# Patient Record
Sex: Female | Born: 1945
Health system: Southern US, Community
[De-identification: ages and names within clinical notes are randomized; demographics above are authoritative.]

## PROBLEM LIST (undated history)

## (undated) DIAGNOSIS — K219 Gastro-esophageal reflux disease without esophagitis: Secondary | ICD-10-CM

## (undated) DIAGNOSIS — Z9221 Personal history of antineoplastic chemotherapy: Secondary | ICD-10-CM

## (undated) DIAGNOSIS — G459 Transient cerebral ischemic attack, unspecified: Secondary | ICD-10-CM

## (undated) DIAGNOSIS — M1990 Unspecified osteoarthritis, unspecified site: Secondary | ICD-10-CM

## (undated) DIAGNOSIS — E039 Hypothyroidism, unspecified: Secondary | ICD-10-CM

## (undated) DIAGNOSIS — C7951 Secondary malignant neoplasm of bone: Secondary | ICD-10-CM

## (undated) DIAGNOSIS — I10 Essential (primary) hypertension: Secondary | ICD-10-CM

## (undated) DIAGNOSIS — F419 Anxiety disorder, unspecified: Secondary | ICD-10-CM

## (undated) DIAGNOSIS — E7800 Pure hypercholesterolemia, unspecified: Secondary | ICD-10-CM

## (undated) DIAGNOSIS — Z923 Personal history of irradiation: Secondary | ICD-10-CM

## (undated) DIAGNOSIS — C801 Malignant (primary) neoplasm, unspecified: Secondary | ICD-10-CM

## (undated) HISTORY — DX: Hypothyroidism, unspecified: E03.9

## (undated) HISTORY — PX: TOTAL KNEE ARTHROPLASTY: SHX125

## (undated) HISTORY — PX: OTHER SURGICAL HISTORY: SHX169

## (undated) HISTORY — PX: TONSILLECTOMY: SUR1361

## (undated) HISTORY — PX: CATARACT EXTRACTION, BILATERAL: SHX1313

## (undated) HISTORY — DX: Secondary malignant neoplasm of bone: C79.51

## (undated) HISTORY — DX: Unspecified osteoarthritis, unspecified site: M19.90

## (undated) HISTORY — PX: TUBAL LIGATION: SHX77

## (undated) HISTORY — DX: Pure hypercholesterolemia, unspecified: E78.00

## (undated) HISTORY — DX: Gastro-esophageal reflux disease without esophagitis: K21.9

---

## 1995-03-25 HISTORY — PX: OTHER SURGICAL HISTORY: SHX169

## 1995-03-25 HISTORY — PX: MASTECTOMY MODIFIED RADICAL: SUR848

## 1997-11-14 ENCOUNTER — Other Ambulatory Visit: Admission: RE | Admit: 1997-11-14 | Discharge: 1997-11-14 | Payer: Self-pay | Admitting: Obstetrics and Gynecology

## 1997-11-16 ENCOUNTER — Ambulatory Visit (HOSPITAL_COMMUNITY): Admission: RE | Admit: 1997-11-16 | Discharge: 1997-11-16 | Payer: Self-pay | Admitting: Oncology

## 1998-05-29 ENCOUNTER — Encounter (HOSPITAL_COMMUNITY): Payer: Self-pay | Admitting: Oncology

## 1998-05-29 ENCOUNTER — Ambulatory Visit (HOSPITAL_COMMUNITY): Admission: RE | Admit: 1998-05-29 | Discharge: 1998-05-29 | Payer: Self-pay | Admitting: Oncology

## 1998-05-30 ENCOUNTER — Ambulatory Visit (HOSPITAL_COMMUNITY): Admission: RE | Admit: 1998-05-30 | Discharge: 1998-05-30 | Payer: Self-pay | Admitting: Oncology

## 1998-05-30 ENCOUNTER — Encounter (HOSPITAL_COMMUNITY): Payer: Self-pay | Admitting: Oncology

## 1998-12-04 ENCOUNTER — Other Ambulatory Visit: Admission: RE | Admit: 1998-12-04 | Discharge: 1998-12-04 | Payer: Self-pay | Admitting: Obstetrics and Gynecology

## 1999-02-11 ENCOUNTER — Ambulatory Visit (HOSPITAL_COMMUNITY): Admission: RE | Admit: 1999-02-11 | Discharge: 1999-02-11 | Payer: Self-pay | Admitting: Oncology

## 1999-02-11 ENCOUNTER — Encounter (HOSPITAL_COMMUNITY): Payer: Self-pay | Admitting: Oncology

## 1999-02-12 ENCOUNTER — Encounter (HOSPITAL_COMMUNITY): Payer: Self-pay | Admitting: Oncology

## 1999-02-12 ENCOUNTER — Ambulatory Visit (HOSPITAL_COMMUNITY): Admission: RE | Admit: 1999-02-12 | Discharge: 1999-02-12 | Payer: Self-pay | Admitting: Oncology

## 1999-04-17 ENCOUNTER — Encounter (HOSPITAL_COMMUNITY): Payer: Self-pay | Admitting: Oncology

## 1999-04-17 ENCOUNTER — Ambulatory Visit (HOSPITAL_COMMUNITY): Admission: RE | Admit: 1999-04-17 | Discharge: 1999-04-17 | Payer: Self-pay | Admitting: Oncology

## 1999-08-14 ENCOUNTER — Encounter: Payer: Self-pay | Admitting: Obstetrics and Gynecology

## 1999-08-14 ENCOUNTER — Encounter: Admission: RE | Admit: 1999-08-14 | Discharge: 1999-08-14 | Payer: Self-pay | Admitting: Obstetrics and Gynecology

## 1999-10-21 ENCOUNTER — Encounter (HOSPITAL_COMMUNITY): Payer: Self-pay | Admitting: Oncology

## 1999-10-21 ENCOUNTER — Encounter: Admission: RE | Admit: 1999-10-21 | Discharge: 1999-10-21 | Payer: Self-pay | Admitting: Oncology

## 1999-11-20 ENCOUNTER — Other Ambulatory Visit: Admission: RE | Admit: 1999-11-20 | Discharge: 1999-11-20 | Payer: Self-pay | Admitting: Obstetrics and Gynecology

## 2000-02-17 ENCOUNTER — Ambulatory Visit (HOSPITAL_COMMUNITY): Admission: RE | Admit: 2000-02-17 | Discharge: 2000-02-17 | Payer: Self-pay | Admitting: Oncology

## 2000-02-17 ENCOUNTER — Encounter (HOSPITAL_COMMUNITY): Payer: Self-pay | Admitting: Oncology

## 2000-02-18 ENCOUNTER — Ambulatory Visit (HOSPITAL_COMMUNITY): Admission: RE | Admit: 2000-02-18 | Discharge: 2000-02-18 | Payer: Self-pay | Admitting: Oncology

## 2000-02-18 ENCOUNTER — Encounter (HOSPITAL_COMMUNITY): Payer: Self-pay | Admitting: Oncology

## 2000-08-19 ENCOUNTER — Encounter: Payer: Self-pay | Admitting: Obstetrics and Gynecology

## 2000-08-19 ENCOUNTER — Encounter: Admission: RE | Admit: 2000-08-19 | Discharge: 2000-08-19 | Payer: Self-pay | Admitting: Obstetrics and Gynecology

## 2000-10-15 ENCOUNTER — Encounter (HOSPITAL_COMMUNITY): Payer: Self-pay | Admitting: Oncology

## 2000-10-15 ENCOUNTER — Ambulatory Visit (HOSPITAL_COMMUNITY): Admission: RE | Admit: 2000-10-15 | Discharge: 2000-10-15 | Payer: Self-pay | Admitting: Oncology

## 2000-11-24 ENCOUNTER — Other Ambulatory Visit: Admission: RE | Admit: 2000-11-24 | Discharge: 2000-11-24 | Payer: Self-pay | Admitting: Internal Medicine

## 2001-02-22 ENCOUNTER — Ambulatory Visit (HOSPITAL_COMMUNITY): Admission: RE | Admit: 2001-02-22 | Discharge: 2001-02-22 | Payer: Self-pay | Admitting: Oncology

## 2001-02-22 ENCOUNTER — Encounter (HOSPITAL_COMMUNITY): Payer: Self-pay | Admitting: Oncology

## 2001-08-23 ENCOUNTER — Encounter: Admission: RE | Admit: 2001-08-23 | Discharge: 2001-08-23 | Payer: Self-pay | Admitting: Oncology

## 2001-08-23 ENCOUNTER — Encounter (HOSPITAL_COMMUNITY): Payer: Self-pay | Admitting: Oncology

## 2001-10-01 ENCOUNTER — Ambulatory Visit (HOSPITAL_COMMUNITY): Admission: RE | Admit: 2001-10-01 | Discharge: 2001-10-01 | Payer: Self-pay | Admitting: Gastroenterology

## 2001-10-01 ENCOUNTER — Encounter (INDEPENDENT_AMBULATORY_CARE_PROVIDER_SITE_OTHER): Payer: Self-pay | Admitting: *Deleted

## 2002-05-16 ENCOUNTER — Ambulatory Visit (HOSPITAL_COMMUNITY): Admission: RE | Admit: 2002-05-16 | Discharge: 2002-05-16 | Payer: Self-pay | Admitting: Oncology

## 2002-05-16 ENCOUNTER — Encounter (HOSPITAL_COMMUNITY): Payer: Self-pay | Admitting: Oncology

## 2002-05-23 ENCOUNTER — Encounter (HOSPITAL_COMMUNITY): Payer: Self-pay | Admitting: Oncology

## 2002-05-23 ENCOUNTER — Ambulatory Visit (HOSPITAL_COMMUNITY): Admission: RE | Admit: 2002-05-23 | Discharge: 2002-05-23 | Payer: Self-pay | Admitting: Oncology

## 2002-09-05 ENCOUNTER — Encounter: Admission: RE | Admit: 2002-09-05 | Discharge: 2002-09-05 | Payer: Self-pay | Admitting: Oncology

## 2002-09-05 ENCOUNTER — Encounter (HOSPITAL_COMMUNITY): Payer: Self-pay | Admitting: Oncology

## 2002-11-22 ENCOUNTER — Encounter (HOSPITAL_COMMUNITY): Payer: Self-pay | Admitting: Oncology

## 2002-11-22 ENCOUNTER — Ambulatory Visit (HOSPITAL_COMMUNITY): Admission: RE | Admit: 2002-11-22 | Discharge: 2002-11-22 | Payer: Self-pay | Admitting: Oncology

## 2003-06-22 ENCOUNTER — Ambulatory Visit (HOSPITAL_COMMUNITY): Admission: RE | Admit: 2003-06-22 | Discharge: 2003-06-22 | Payer: Self-pay | Admitting: Oncology

## 2003-09-11 ENCOUNTER — Encounter: Admission: RE | Admit: 2003-09-11 | Discharge: 2003-09-11 | Payer: Self-pay | Admitting: Oncology

## 2004-06-21 ENCOUNTER — Ambulatory Visit: Payer: Self-pay | Admitting: Oncology

## 2004-06-24 ENCOUNTER — Ambulatory Visit (HOSPITAL_COMMUNITY): Admission: RE | Admit: 2004-06-24 | Discharge: 2004-06-24 | Payer: Self-pay | Admitting: Oncology

## 2004-09-20 ENCOUNTER — Encounter: Admission: RE | Admit: 2004-09-20 | Discharge: 2004-09-20 | Payer: Self-pay | Admitting: Oncology

## 2004-12-23 ENCOUNTER — Ambulatory Visit: Payer: Self-pay | Admitting: Oncology

## 2005-07-14 ENCOUNTER — Ambulatory Visit: Payer: Self-pay | Admitting: Oncology

## 2005-07-14 LAB — CBC WITH DIFFERENTIAL/PLATELET
BASO%: 0.4 % (ref 0.0–2.0)
Basophils Absolute: 0 10*3/uL (ref 0.0–0.1)
EOS%: 1.6 % (ref 0.0–7.0)
Eosinophils Absolute: 0.1 10*3/uL (ref 0.0–0.5)
HCT: 40.7 % (ref 34.8–46.6)
HGB: 13.6 g/dL (ref 11.6–15.9)
LYMPH%: 27.4 % (ref 14.0–48.0)
MCH: 27.8 pg (ref 26.0–34.0)
MCHC: 33.6 g/dL (ref 32.0–36.0)
MCV: 82.7 fL (ref 81.0–101.0)
MONO#: 0.6 10*3/uL (ref 0.1–0.9)
MONO%: 9.5 % (ref 0.0–13.0)
NEUT#: 3.7 10*3/uL (ref 1.5–6.5)
NEUT%: 61.1 % (ref 39.6–76.8)
Platelets: 252 10*3/uL (ref 145–400)
RBC: 4.92 10*6/uL (ref 3.70–5.32)
RDW: 14.5 % (ref 11.3–14.5)
WBC: 6.1 10*3/uL (ref 3.9–10.0)
lymph#: 1.7 10*3/uL (ref 0.9–3.3)

## 2005-07-14 LAB — LIPID PANEL
Cholesterol: 196 mg/dL (ref 0–200)
HDL: 61 mg/dL (ref >39)
LDL Cholesterol: 95 mg/dL (ref 0–99)
Total CHOL/HDL Ratio: 3.2 Ratio
Triglycerides: 200 mg/dL — ABNORMAL HIGH (ref <150)
VLDL: 40 mg/dL (ref 0–40)

## 2005-07-14 LAB — COMPREHENSIVE METABOLIC PANEL
ALT: 11 U/L (ref 0–40)
AST: 17 U/L (ref 0–37)
Albumin: 4.6 g/dL (ref 3.5–5.2)
Alkaline Phosphatase: 69 U/L (ref 39–117)
BUN: 16 mg/dL (ref 6–23)
CO2: 27 mEq/L (ref 19–32)
Calcium: 9.4 mg/dL (ref 8.4–10.5)
Chloride: 103 mEq/L (ref 96–112)
Creatinine, Ser: 0.9 mg/dL (ref 0.4–1.2)
Glucose, Bld: 91 mg/dL (ref 70–99)
Potassium: 4.4 mEq/L (ref 3.5–5.3)
Sodium: 140 mEq/L (ref 135–145)
Total Bilirubin: 0.5 mg/dL (ref 0.3–1.2)
Total Protein: 7 g/dL (ref 6.0–8.3)

## 2005-07-14 LAB — LACTATE DEHYDROGENASE: LDH: 138 U/L (ref 94–250)

## 2005-09-29 ENCOUNTER — Encounter: Admission: RE | Admit: 2005-09-29 | Discharge: 2005-09-29 | Payer: Self-pay | Admitting: Obstetrics and Gynecology

## 2005-12-26 ENCOUNTER — Ambulatory Visit: Payer: Self-pay | Admitting: Oncology

## 2006-01-13 ENCOUNTER — Inpatient Hospital Stay (HOSPITAL_COMMUNITY): Admission: RE | Admit: 2006-01-13 | Discharge: 2006-01-16 | Payer: Self-pay | Admitting: Specialist

## 2006-06-25 ENCOUNTER — Ambulatory Visit: Payer: Self-pay | Admitting: Oncology

## 2006-06-30 ENCOUNTER — Ambulatory Visit (HOSPITAL_COMMUNITY): Admission: RE | Admit: 2006-06-30 | Discharge: 2006-06-30 | Payer: Self-pay | Admitting: Oncology

## 2006-06-30 LAB — CBC WITH DIFFERENTIAL/PLATELET
BASO%: 0.7 % (ref 0.0–2.0)
Basophils Absolute: 0 10*3/uL (ref 0.0–0.1)
EOS%: 1.8 % (ref 0.0–7.0)
Eosinophils Absolute: 0.1 10*3/uL (ref 0.0–0.5)
HCT: 38.5 % (ref 34.8–46.6)
HGB: 13.3 g/dL (ref 11.6–15.9)
LYMPH%: 28.9 % (ref 14.0–48.0)
MCH: 27.5 pg (ref 26.0–34.0)
MCHC: 34.6 g/dL (ref 32.0–36.0)
MCV: 79.6 fL — ABNORMAL LOW (ref 81.0–101.0)
MONO#: 0.4 10*3/uL (ref 0.1–0.9)
MONO%: 9.5 % (ref 0.0–13.0)
NEUT#: 2.6 10*3/uL (ref 1.5–6.5)
NEUT%: 59.1 % (ref 39.6–76.8)
Platelets: 253 10*3/uL (ref 145–400)
RBC: 4.84 10*6/uL (ref 3.70–5.32)
RDW: 15.6 % — ABNORMAL HIGH (ref 11.3–14.5)
WBC: 4.3 10*3/uL (ref 3.9–10.0)
lymph#: 1.3 10*3/uL (ref 0.9–3.3)

## 2006-06-30 LAB — COMPREHENSIVE METABOLIC PANEL
ALT: 10 U/L (ref 0–35)
AST: 16 U/L (ref 0–37)
Albumin: 4.9 g/dL (ref 3.5–5.2)
Alkaline Phosphatase: 75 U/L (ref 39–117)
BUN: 13 mg/dL (ref 6–23)
CO2: 27 mEq/L (ref 19–32)
Calcium: 9.6 mg/dL (ref 8.4–10.5)
Chloride: 102 mEq/L (ref 96–112)
Creatinine, Ser: 0.87 mg/dL (ref 0.40–1.20)
Glucose, Bld: 99 mg/dL (ref 70–99)
Potassium: 4.4 mEq/L (ref 3.5–5.3)
Sodium: 141 mEq/L (ref 135–145)
Total Bilirubin: 0.6 mg/dL (ref 0.3–1.2)
Total Protein: 7.1 g/dL (ref 6.0–8.3)

## 2006-06-30 LAB — LIPID PANEL
Cholesterol: 197 mg/dL (ref 0–200)
HDL: 52 mg/dL (ref >39)
LDL Cholesterol: 93 mg/dL (ref 0–99)
Total CHOL/HDL Ratio: 3.8 Ratio
Triglycerides: 262 mg/dL — ABNORMAL HIGH (ref <150)
VLDL: 52 mg/dL — ABNORMAL HIGH (ref 0–40)

## 2006-06-30 LAB — LACTATE DEHYDROGENASE: LDH: 125 U/L (ref 94–250)

## 2006-10-01 ENCOUNTER — Encounter: Admission: RE | Admit: 2006-10-01 | Discharge: 2006-10-01 | Payer: Self-pay | Admitting: Family Medicine

## 2006-12-30 ENCOUNTER — Ambulatory Visit: Payer: Self-pay | Admitting: Oncology

## 2007-01-01 LAB — COMPREHENSIVE METABOLIC PANEL
ALT: 10 U/L (ref 0–35)
AST: 16 U/L (ref 0–37)
Albumin: 4.6 g/dL (ref 3.5–5.2)
Alkaline Phosphatase: 70 U/L (ref 39–117)
BUN: 13 mg/dL (ref 6–23)
CO2: 26 mEq/L (ref 19–32)
Calcium: 9.1 mg/dL (ref 8.4–10.5)
Chloride: 101 mEq/L (ref 96–112)
Creatinine, Ser: 0.83 mg/dL (ref 0.40–1.20)
Glucose, Bld: 91 mg/dL (ref 70–99)
Potassium: 4 mEq/L (ref 3.5–5.3)
Sodium: 139 mEq/L (ref 135–145)
Total Bilirubin: 0.4 mg/dL (ref 0.3–1.2)
Total Protein: 6.9 g/dL (ref 6.0–8.3)

## 2007-01-01 LAB — CBC WITH DIFFERENTIAL/PLATELET
BASO%: 1.1 % (ref 0.0–2.0)
Basophils Absolute: 0 10*3/uL (ref 0.0–0.1)
EOS%: 2.8 % (ref 0.0–7.0)
Eosinophils Absolute: 0.1 10*3/uL (ref 0.0–0.5)
HCT: 38.2 % (ref 34.8–46.6)
HGB: 13.2 g/dL (ref 11.6–15.9)
LYMPH%: 30.7 % (ref 14.0–48.0)
MCH: 27.8 pg (ref 26.0–34.0)
MCHC: 34.4 g/dL (ref 32.0–36.0)
MCV: 80.9 fL — ABNORMAL LOW (ref 81.0–101.0)
MONO#: 0.4 10*3/uL (ref 0.1–0.9)
MONO%: 9.7 % (ref 0.0–13.0)
NEUT#: 2.5 10*3/uL (ref 1.5–6.5)
NEUT%: 55.7 % (ref 39.6–76.8)
Platelets: 254 10*3/uL (ref 145–400)
RBC: 4.73 10*6/uL (ref 3.70–5.32)
RDW: 14.7 % — ABNORMAL HIGH (ref 11.3–14.5)
WBC: 4.6 10*3/uL (ref 3.9–10.0)
lymph#: 1.4 10*3/uL (ref 0.9–3.3)

## 2007-01-01 LAB — LACTATE DEHYDROGENASE: LDH: 140 U/L (ref 94–250)

## 2007-01-18 ENCOUNTER — Encounter: Admission: RE | Admit: 2007-01-18 | Discharge: 2007-01-18 | Payer: Self-pay | Admitting: Oncology

## 2007-06-29 ENCOUNTER — Ambulatory Visit: Payer: Self-pay | Admitting: Oncology

## 2007-07-01 LAB — COMPREHENSIVE METABOLIC PANEL
ALT: 12 U/L (ref 0–35)
AST: 17 U/L (ref 0–37)
Albumin: 4.7 g/dL (ref 3.5–5.2)
Alkaline Phosphatase: 63 U/L (ref 39–117)
BUN: 17 mg/dL (ref 6–23)
CO2: 26 mEq/L (ref 19–32)
Calcium: 9.2 mg/dL (ref 8.4–10.5)
Chloride: 104 mEq/L (ref 96–112)
Creatinine, Ser: 0.87 mg/dL (ref 0.40–1.20)
Glucose, Bld: 73 mg/dL (ref 70–99)
Potassium: 3.7 mEq/L (ref 3.5–5.3)
Sodium: 142 mEq/L (ref 135–145)
Total Bilirubin: 0.5 mg/dL (ref 0.3–1.2)
Total Protein: 6.8 g/dL (ref 6.0–8.3)

## 2007-07-01 LAB — CBC WITH DIFFERENTIAL/PLATELET
BASO%: 0.2 % (ref 0.0–2.0)
Basophils Absolute: 0 10*3/uL (ref 0.0–0.1)
EOS%: 1.8 % (ref 0.0–7.0)
Eosinophils Absolute: 0.1 10*3/uL (ref 0.0–0.5)
HCT: 39.8 % (ref 34.8–46.6)
HGB: 13.8 g/dL (ref 11.6–15.9)
LYMPH%: 29 % (ref 14.0–48.0)
MCH: 28.2 pg (ref 26.0–34.0)
MCHC: 34.6 g/dL (ref 32.0–36.0)
MCV: 81.5 fL (ref 81.0–101.0)
MONO#: 0.4 10*3/uL (ref 0.1–0.9)
MONO%: 8.6 % (ref 0.0–13.0)
NEUT#: 2.8 10*3/uL (ref 1.5–6.5)
NEUT%: 60.4 % (ref 39.6–76.8)
Platelets: 231 10*3/uL (ref 145–400)
RBC: 4.88 10*6/uL (ref 3.70–5.32)
RDW: 14.5 % (ref 11.3–14.5)
WBC: 4.7 10*3/uL (ref 3.9–10.0)
lymph#: 1.4 10*3/uL (ref 0.9–3.3)

## 2007-07-01 LAB — LACTATE DEHYDROGENASE: LDH: 132 U/L (ref 94–250)

## 2007-07-29 ENCOUNTER — Encounter: Admission: RE | Admit: 2007-07-29 | Discharge: 2007-07-29 | Payer: Self-pay | Admitting: Oncology

## 2007-10-20 ENCOUNTER — Encounter: Admission: RE | Admit: 2007-10-20 | Discharge: 2007-10-20 | Payer: Self-pay | Admitting: Oncology

## 2007-12-29 ENCOUNTER — Ambulatory Visit: Payer: Self-pay | Admitting: Oncology

## 2007-12-31 ENCOUNTER — Ambulatory Visit (HOSPITAL_COMMUNITY): Admission: RE | Admit: 2007-12-31 | Discharge: 2007-12-31 | Payer: Self-pay | Admitting: Oncology

## 2007-12-31 LAB — COMPREHENSIVE METABOLIC PANEL
ALT: 12 U/L (ref 0–35)
AST: 16 U/L (ref 0–37)
Albumin: 4.7 g/dL (ref 3.5–5.2)
Alkaline Phosphatase: 62 U/L (ref 39–117)
BUN: 14 mg/dL (ref 6–23)
CO2: 23 mEq/L (ref 19–32)
Calcium: 9.1 mg/dL (ref 8.4–10.5)
Chloride: 106 mEq/L (ref 96–112)
Creatinine, Ser: 0.86 mg/dL (ref 0.40–1.20)
Glucose, Bld: 93 mg/dL (ref 70–99)
Potassium: 4.2 mEq/L (ref 3.5–5.3)
Sodium: 141 mEq/L (ref 135–145)
Total Bilirubin: 0.6 mg/dL (ref 0.3–1.2)
Total Protein: 7 g/dL (ref 6.0–8.3)

## 2007-12-31 LAB — CBC WITH DIFFERENTIAL/PLATELET
BASO%: 0.8 % (ref 0.0–2.0)
Basophils Absolute: 0 10*3/uL (ref 0.0–0.1)
EOS%: 2.6 % (ref 0.0–7.0)
Eosinophils Absolute: 0.1 10*3/uL (ref 0.0–0.5)
HCT: 40.3 % (ref 34.8–46.6)
HGB: 13.7 g/dL (ref 11.6–15.9)
LYMPH%: 30.4 % (ref 14.0–48.0)
MCH: 28.2 pg (ref 26.0–34.0)
MCHC: 34.1 g/dL (ref 32.0–36.0)
MCV: 82.6 fL (ref 81.0–101.0)
MONO#: 0.5 10*3/uL (ref 0.1–0.9)
MONO%: 9.9 % (ref 0.0–13.0)
NEUT#: 2.7 10*3/uL (ref 1.5–6.5)
NEUT%: 56.3 % (ref 39.6–76.8)
Platelets: 215 10*3/uL (ref 145–400)
RBC: 4.88 10*6/uL (ref 3.70–5.32)
RDW: 15.1 % — ABNORMAL HIGH (ref 11.3–14.5)
WBC: 4.7 10*3/uL (ref 3.9–10.0)
lymph#: 1.4 10*3/uL (ref 0.9–3.3)

## 2007-12-31 LAB — LACTATE DEHYDROGENASE: LDH: 142 U/L (ref 94–250)

## 2008-06-28 ENCOUNTER — Ambulatory Visit: Payer: Self-pay | Admitting: Oncology

## 2008-06-30 LAB — CBC WITH DIFFERENTIAL/PLATELET
BASO%: 0.8 % (ref 0.0–2.0)
Basophils Absolute: 0 10*3/uL (ref 0.0–0.1)
EOS%: 2 % (ref 0.0–7.0)
Eosinophils Absolute: 0.1 10*3/uL (ref 0.0–0.5)
HCT: 39.5 % (ref 34.8–46.6)
HGB: 13.4 g/dL (ref 11.6–15.9)
LYMPH%: 30.1 % (ref 14.0–49.7)
MCH: 28.2 pg (ref 25.1–34.0)
MCHC: 33.8 g/dL (ref 31.5–36.0)
MCV: 83.3 fL (ref 79.5–101.0)
MONO#: 0.4 10*3/uL (ref 0.1–0.9)
MONO%: 9.3 % (ref 0.0–14.0)
NEUT#: 2.8 10*3/uL (ref 1.5–6.5)
NEUT%: 57.8 % (ref 38.4–76.8)
Platelets: 228 10*3/uL (ref 145–400)
RBC: 4.74 10*6/uL (ref 3.70–5.45)
RDW: 14.3 % (ref 11.2–14.5)
WBC: 4.8 10*3/uL (ref 3.9–10.3)
lymph#: 1.4 10*3/uL (ref 0.9–3.3)

## 2008-06-30 LAB — COMPREHENSIVE METABOLIC PANEL
ALT: 14 U/L (ref 0–35)
AST: 19 U/L (ref 0–37)
Albumin: 4.6 g/dL (ref 3.5–5.2)
Alkaline Phosphatase: 66 U/L (ref 39–117)
BUN: 16 mg/dL (ref 6–23)
CO2: 27 mEq/L (ref 19–32)
Calcium: 9.6 mg/dL (ref 8.4–10.5)
Chloride: 104 mEq/L (ref 96–112)
Creatinine, Ser: 0.88 mg/dL (ref 0.40–1.20)
Glucose, Bld: 74 mg/dL (ref 70–99)
Potassium: 4 mEq/L (ref 3.5–5.3)
Sodium: 143 mEq/L (ref 135–145)
Total Bilirubin: 0.5 mg/dL (ref 0.3–1.2)
Total Protein: 6.7 g/dL (ref 6.0–8.3)

## 2008-06-30 LAB — LACTATE DEHYDROGENASE: LDH: 152 U/L (ref 94–250)

## 2008-10-20 ENCOUNTER — Encounter: Admission: RE | Admit: 2008-10-20 | Discharge: 2008-10-20 | Payer: Self-pay | Admitting: Family Medicine

## 2008-10-24 ENCOUNTER — Encounter: Admission: RE | Admit: 2008-10-24 | Discharge: 2008-10-24 | Payer: Self-pay | Admitting: Family Medicine

## 2008-12-27 ENCOUNTER — Ambulatory Visit: Payer: Self-pay | Admitting: Oncology

## 2008-12-29 ENCOUNTER — Ambulatory Visit (HOSPITAL_COMMUNITY): Admission: RE | Admit: 2008-12-29 | Discharge: 2008-12-29 | Payer: Self-pay | Admitting: Oncology

## 2008-12-29 LAB — CBC WITH DIFFERENTIAL/PLATELET
BASO%: 1.1 % (ref 0.0–2.0)
Basophils Absolute: 0 10*3/uL (ref 0.0–0.1)
EOS%: 2.9 % (ref 0.0–7.0)
Eosinophils Absolute: 0.1 10*3/uL (ref 0.0–0.5)
HCT: 40 % (ref 34.8–46.6)
HGB: 13.6 g/dL (ref 11.6–15.9)
LYMPH%: 26.6 % (ref 14.0–49.7)
MCH: 28.5 pg (ref 25.1–34.0)
MCHC: 34.1 g/dL (ref 31.5–36.0)
MCV: 83.4 fL (ref 79.5–101.0)
MONO#: 0.5 10*3/uL (ref 0.1–0.9)
MONO%: 11.3 % (ref 0.0–14.0)
NEUT#: 2.4 10*3/uL (ref 1.5–6.5)
NEUT%: 58.1 % (ref 38.4–76.8)
Platelets: 213 10*3/uL (ref 145–400)
RBC: 4.79 10*6/uL (ref 3.70–5.45)
RDW: 14.6 % — ABNORMAL HIGH (ref 11.2–14.5)
WBC: 4.2 10*3/uL (ref 3.9–10.3)
lymph#: 1.1 10*3/uL (ref 0.9–3.3)

## 2008-12-29 LAB — COMPREHENSIVE METABOLIC PANEL
ALT: 12 U/L (ref 0–35)
AST: 21 U/L (ref 0–37)
Albumin: 4.7 g/dL (ref 3.5–5.2)
Alkaline Phosphatase: 63 U/L (ref 39–117)
BUN: 21 mg/dL (ref 6–23)
CO2: 28 mEq/L (ref 19–32)
Calcium: 9.5 mg/dL (ref 8.4–10.5)
Chloride: 102 mEq/L (ref 96–112)
Creatinine, Ser: 1.01 mg/dL (ref 0.40–1.20)
Glucose, Bld: 65 mg/dL — ABNORMAL LOW (ref 70–99)
Potassium: 4.7 mEq/L (ref 3.5–5.3)
Sodium: 141 mEq/L (ref 135–145)
Total Bilirubin: 0.6 mg/dL (ref 0.3–1.2)
Total Protein: 6.8 g/dL (ref 6.0–8.3)

## 2008-12-29 LAB — LACTATE DEHYDROGENASE: LDH: 155 U/L (ref 94–250)

## 2009-10-26 ENCOUNTER — Encounter: Admission: RE | Admit: 2009-10-26 | Discharge: 2009-10-26 | Payer: Self-pay | Admitting: Family Medicine

## 2009-10-31 ENCOUNTER — Encounter: Admission: RE | Admit: 2009-10-31 | Discharge: 2009-10-31 | Payer: Self-pay | Admitting: Family Medicine

## 2009-11-05 ENCOUNTER — Encounter: Admission: RE | Admit: 2009-11-05 | Discharge: 2009-11-05 | Payer: Self-pay | Admitting: Family Medicine

## 2009-12-26 ENCOUNTER — Ambulatory Visit: Payer: Self-pay | Admitting: Oncology

## 2009-12-28 LAB — LACTATE DEHYDROGENASE: LDH: 155 U/L (ref 94–250)

## 2009-12-28 LAB — CBC WITH DIFFERENTIAL/PLATELET
BASO%: 0.7 % (ref 0.0–2.0)
Basophils Absolute: 0 10*3/uL (ref 0.0–0.1)
EOS%: 2.1 % (ref 0.0–7.0)
Eosinophils Absolute: 0.1 10*3/uL (ref 0.0–0.5)
HCT: 41.2 % (ref 34.8–46.6)
HGB: 14.1 g/dL (ref 11.6–15.9)
LYMPH%: 24.2 % (ref 14.0–49.7)
MCH: 29.2 pg (ref 25.1–34.0)
MCHC: 34.3 g/dL (ref 31.5–36.0)
MCV: 85.1 fL (ref 79.5–101.0)
MONO#: 0.4 10*3/uL (ref 0.1–0.9)
MONO%: 9.2 % (ref 0.0–14.0)
NEUT#: 2.5 10*3/uL (ref 1.5–6.5)
NEUT%: 63.8 % (ref 38.4–76.8)
Platelets: 213 10*3/uL (ref 145–400)
RBC: 4.84 10*6/uL (ref 3.70–5.45)
RDW: 14.4 % (ref 11.2–14.5)
WBC: 3.9 10*3/uL (ref 3.9–10.3)
lymph#: 0.9 10*3/uL (ref 0.9–3.3)

## 2009-12-28 LAB — COMPREHENSIVE METABOLIC PANEL
ALT: 13 U/L (ref 0–35)
AST: 22 U/L (ref 0–37)
Albumin: 4.6 g/dL (ref 3.5–5.2)
Alkaline Phosphatase: 59 U/L (ref 39–117)
BUN: 19 mg/dL (ref 6–23)
CO2: 28 mEq/L (ref 19–32)
Calcium: 9.1 mg/dL (ref 8.4–10.5)
Chloride: 100 mEq/L (ref 96–112)
Creatinine, Ser: 0.99 mg/dL (ref 0.40–1.20)
Glucose, Bld: 83 mg/dL (ref 70–99)
Potassium: 4 mEq/L (ref 3.5–5.3)
Sodium: 139 mEq/L (ref 135–145)
Total Bilirubin: 0.6 mg/dL (ref 0.3–1.2)
Total Protein: 6.6 g/dL (ref 6.0–8.3)

## 2010-04-14 ENCOUNTER — Encounter: Payer: Self-pay | Admitting: Family Medicine

## 2010-08-09 NOTE — Consult Note (Signed)
NAME:  Jade Mooney, Jade Mooney              ACCOUNT NO.:  [id number]   MEDICAL RECORD NO.:  [id number]          PATIENT TYPE:  INP   LOCATION:  1503                         FACILITY:  [facility]   PHYSICIAN:  Jackie Plum, M.D.DATE OF BIRTH:  08/10/1945   DATE OF CONSULTATION:  01/15/2006  DATE OF DISCHARGE:                                   CONSULTATION   REQUESTING PHYSICIAN:  Dr. Thomasena Edis of Orthopedics.   REASON FOR CONSULTATION:  Hyponatremia.   HPI:  Patient is a 65 year old lady with history of end-stage osteoarthritis  of the left knee who had failed conservative management for her pain and was  therefore admitted and had left knee total arthroplasty done on January 13, 2006, by Dr. Thomasena Edis.  Patient had been hyponatremic, which had been  worsening, and hospitalist service was asked to evaluate for further  treatment in this regard.  Patient has been nauseous and actually vomited  twice this morning.  No chest pain, no fever or chills.  No abdominal pain.  No diarrhea but she has not moved her bowels since last afternoon.  She has  history of hypothyroidism, dyslipidemia and osteoarthritis.  Current  medicines were reviewed, as noted on the Admission Medication List, as well  as the home medications also reviewed on Medication Form.   SHE HAS ALLERGIES AND INTOLERANCE TO:  1. PERCODAN.  2. PERCOCET .  3. AZITHROMYCIN.  4. AMOXICILLIN.   FAMILY HISTORY:  Positive for:  1. Heart disease.  2. Diabetes.  3. Hypertension.  4. Stroke.   SOCIAL HISTORY:  Patient is married.  Does not smoke cigarettes and drinks  alcohol on a social basis.   REVIEW OF SYSTEMS:  As noted above, otherwise unremarkable.   PHYSICAL EXAMINATION:  The BP 96/64, pulse 68, respirations 20, temp 98.6  degrees Fahrenheit.  An O2 sat of 88% on room air.  GENERAL EXAM:  The patient was not acutely ill looking.  She was not in any  acute cardiopulmonary distress.  HEENT:  Normocephalic, atraumatic.  Pupils  were equal, round, react to  light.  Extraocular movements intact.  She had mild sclera pallor without  icterus.  NECK:  Supple, no JVD.  LUNGS:  With decreased breath sounds at the bases.  CARDIAC:  Regular rate and rhythm, no gallops or murmur.  ABDOMEN:  Slightly distended bowel sounds were present.  There was no  obvious tenderness.  EXTREMITIES:  No cyanosis.  Left lower extremity was in a cast.  There was  no edema involving the right lower extremity.  Patient was alert and oriented x3, no acute focal deficit.   LAB WORK:  A WBC count 6.2, hemoglobin 9.3, hematocrit 26.9, MCV 91.8,  platelet count 177,000, INR 1.5, PT 18.4, sodium 126 (sodium was 133 on  January 14, 2006, at 4 a.m. but had come up to 130 at 1:50 p.m. later during  the day yesterday).  Potassium 3.5, chloride 95, CO2 25, glucose 144, BUN 9,  creatinine 0.6.   IMPRESSION:  1. Hyponatremia.  2. Nausea and vomiting probably secondary to diagnosis #1 but cannot rule  out ileus.  3. Postoperative anemia.   PLAN:  1. The patient will be started on IV saline.  2. Would check a KUB.  3. Will also check an x-ray of the chest since her O2 saturation on room      air is marginal.  4. Will get TSH.  5. Will schedule her Reglan to 10 mg every 8 hours.  6. Will continue other antinauseas and antiemetics.  7. Will follow up on her sodium.      Jackie Plum, M.D.  Electronically Signed     GO/MEDQ  D:  01/15/2006  T:  01/15/2006  Job:  [id number]

## 2010-08-09 NOTE — H&P (Signed)
NAME:  Jade, Mooney NO.:  [id number]   MEDICAL RECORD NO.:  [id number]           PATIENT TYPE:   LOCATION:                                 FACILITY:   PHYSICIAN:  Erasmo Leventhal, M.D.DATE OF BIRTH:  01-02-1946   DATE OF ADMISSION:  DATE OF DISCHARGE:                                HISTORY & PHYSICAL   DATE OF SURGERY:  January 13, 2006   CHIEF COMPLAINT:  Left knee osteoarthritis.   HISTORY OF PRESENT ILLNESS:  This is a 65 year old lady with a history of  end-stage osteoarthritis of the left knee who has failed conservative  management of her pain.  She has daily pain, pain with ambulation and after  discussion of treatment options, the patient is now scheduled for total knee  arthroplasty of the left knee.  The surgery, benefits and aftercare were  discussed in detail with the patient.  Questions invited and answered and  surgery go ahead is scheduled.   PAST MEDICAL HISTORY:   DRUG ALLERGIES:  TO PERCODAN AND PERCOCET WITH NAUSEA AND VOMITING AND JAW  TIGHTNESS AND ERYTHROMYCIN AND AMOXICILLIN WITH A RASH.   CURRENT MEDICATIONS:  1. Include Synthroid 100 mcg daily.  2. Zyrtec 10 mg daily.  3. Aspirin 81 mg daily.  4. Acular 0.5% b.i.d. to the face for rosacea.  5. Lorazepam 0.5 mg one daily.  6. Metro lotion 0.75% twice a day to the face for rosacea.  7. Nexium 40 mg one p.o. daily.  8. Zocor 20 mg one p.o. daily.  9. Celebrex 200 mg one daily.   PREVIOUS SURGERIES:  1. Include tonsillectomy.  2. Tubal ligation.  3. Mastectomy for breast cancer.  4. Knee surgery.   SERIOUS MEDICAL ILLNESSES:  1. Include hypothyroidism.  2. Hypercholesterolemia.  3. Gastroesophageal reflux disease rosacea.  4. Breast cancer.   FAMILY HISTORY:  Positive for coronary disease, hypertension, diabetes and  CVA.   SOCIAL HISTORY:  The patient is married.  She is retired.  She lives at  home.  She does not smoke and drinks rarely.   REVIEW OF SYSTEMS:   Nervous system negative for headache, blurred vision or  dizziness.  PULMONARY:  No shortness of breath, PND nor orthopnea.  CARDIOVASCULAR:  No chest pain or palpitation.  GI:  Positive for GERD, cholecystitis and diverticulitis.  GU:  Negative for urinary tract difficulty.  MUSCULOSKELETAL:  Positive in HPI.   PHYSICAL EXAM:  BP 140/90, respiration 18, pulse 78 and regular.  GENERAL APPEARANCE:  This is a well-developed, well-nourished lady in no  acute distress.  HEENT:  Head normocephalic.  Nose patent.  Ears patent.  Pupils , round,  reactive to light.  Throat without injection.  NECK:  Supple without adenopathy.  Carotid 2+ without bruit.  CHEST:  Clear to auscultation.  No rales or rhonchi.  Respirations 18.  HEART:  Regular rate and rhythm at 78 beats per without murmur.  ABDOMEN:  Soft with active bowel sounds.  No mass or organomegaly.  NEUROLOGIC:  Patient alert and oriented to time, place and person.  Cranial  nerves II-XII grossly intact.  EXTREMITIES:  Shows the left knee with valgus deformity.  Negative 5 to 135  degrees range of motion.  Dorsalis pedis and posterior tibialis pulses are  2+.   X-RAYS:  Show end-stage osteoarthritis of the left knee with valgus  deformity.   IMPRESSION:  End-stage osteoarthritis of the left knee with valgus  deformity.   PLAN:  Total knee arthroplasty, left knee.      Jaquelyn Bitter. Chabon, P.A.    ______________________________  Erasmo Leventhal, M.D.    SJC/MEDQ  D:  12/24/2005  T:  12/25/2005  Job:  [id number]

## 2010-08-09 NOTE — Discharge Summary (Signed)
NAME:  Jade Mooney, Jade Mooney              ACCOUNT NO.:  [id number]   MEDICAL RECORD NO.:  [id number]          PATIENT TYPE:  INP   LOCATION:  1503                         FACILITY:  [facility]   PHYSICIAN:  Erasmo Leventhal, M.D.DATE OF BIRTH:  [birth year]   DATE OF ADMISSION:  01/13/2006  DATE OF DISCHARGE:  01/16/2006                                 DISCHARGE SUMMARY   __________   Her hemoglobin and hematocrit reached a low of __________  She had an  episode of mildly elevated potassium __________  She did, however, have an  episode of hyponatremia down to 1.6 and medical consult was obtained.  Fluids were __________ and on discharge her sodium and potassium was normal.  INR at discharge 2.1.  TSH level at 2.63.  Urinalysis normal.   HOSPITAL COURSE:  The patient tolerated the operative procedure well.  The  first postoperative day, her vital signs were stable, she was afebrile.  Hemoglobin was 10.4 and hematocrit 30.2.  Potassium was 5.3.  Lungs were  clear.  Heart sounds normal.  Bowel sounds active.  Moving extremities  without difficulty.  Calves were negative.  Her IV fluids were switched to  normal saline.  TPA was discontinued and switched to p.o. pain medication.  On the second postoperative day, she was a little nauseated and her vital  signs were stable.  She was afebrile.  O2 saturations just dropped to 88 on  room air.  O2 was restarted and she was back up to 96.  Hemoglobin 9.3 and  hematocrit 26.9.  Sodium was 126 despite fluid restrictions.  His potassium  had returned to normal.  Her dressing is clean and wound benign.  __________  negative.  Bowel sounds active.  Lung sounds clear.  A medical consult was  obtained for hyponatremia.  She was switched to normal saline at 75 an hour.  __________ water restriction was continued.  Abdominal x-ray showed no ileus  or obstruction.  Chest x-ray showed some mild atelectasis.  On the third  postoperative day, her vital signs were  stable with temperature at 100.2.  I's and O's were good.  Hemoglobin __________  White count normal.  BMET now  within normal limits with exception of glucose at 118 and calcium at 10.9  which is on the increase and INR of 2.1.  TSH was normal at 2.63.  Chest x-  ray showed left lower lobe atelectasis.  Abdomen showed no distention except  for an ileus.  Urinalysis was normal.  Lungs were clear.  Heart sounds  normal.  Bowel sounds active.  Calves are negative.  __________ benign.  Subsequently, the patient requested to go home and she will be discharged  home today after seen by the medical service and physical therapy.   CONDITION ON DISCHARGE:  Improved.   DISCHARGE MEDICATIONS:  1. Norco 5/325 one to two p.o. q.4 h. p.r.n. pain.  2. Robaxin 500 mg p.o. daily p.r.n. spasm.  3. __________ for anemia.  4. Coumadin per pharmacy.   FOLLOW UP:  Follow up in the office in 2 weeks.   SPECIAL INSTRUCTIONS:  She is to do her home PT and do her home exercises.  Use incentive spirometry q.1 h. while she is awake.  __________  and call if  any problems arise.      Jaquelyn Bitter. Chabon, P.A.    ______________________________  Erasmo Leventhal, M.D.    SJC/MEDQ  D:  01/16/2006  T:  01/17/2006  Job:  [id number]

## 2010-08-09 NOTE — Procedures (Signed)
[facility]  Patient:    Jade Mooney, Jade Mooney Visit Number: [id number] MRN: [id number]          Service Type: END Location: ENDO Attending Physician:  Nelda Marseille Dictated by:   Petra Kuba, M.D. Proc. Date: 10/01/01 Admit Date:  10/01/2001 Discharge Date: 10/01/2001   CC:         Samul Dada, M.D.   Procedure Report  PROCEDURE:  Colonoscopy.  INDICATIONS FOR PROCEDURE:  A patient with bright red blood per rectum, right sided abdominal pain due for colonic screening.  Consent was signed after risks, benefits, methods, and options were thoroughly discussed in the office.  MEDICINES USED:  Demerol 70, Versed 6.  DESCRIPTION OF PROCEDURE:  Rectal inspection was pertinent for small external hemorrhoids. Digital exam was negative. The pediatric video adjustable colonoscope was inserted and easily advanced around the colon to the cecum. This did require some abdominal pressure but no position changes. On insertion, some left sided diverticula were seen but no other abnormalities. The cecum was identified by the appendiceal orifice and the ileocecal valve. In fact, the scope was inserted a short ways into the terminal ileum which was normal. Photo documentation was obtained. The scope was then slowly withdrawn. The prep was adequate, there was some liquid stool that required washing and suctioning. On slow withdrawal through the colon, the right side of the colon was normal. In the more proximal descending, a tiny polyp was seen and was hot biopsied x1. There was some left sided diverticula and in the distal sigmoid another tiny polyp was seen and was hot biopsied x1 and put in the same container. The scope was withdrawn back to the rectum and retroflexed pertinent for some small internal hemorrhoids. The scope was straightened, advanced a short ways up the left side of the colon, air was suctioned, scope removed. The patient tolerated  the procedure well and there was no obvious or immediate complications.  ENDOSCOPIC DIAGNOSIS: 1. Small internal and external hemorrhoids. 2. Left sided diverticula. 3. Two tiny distal sigmoid and proximal descending polyps hot biopsied. 4. Otherwise within normal limits to the terminal ileum.  PLAN:  Yearly rectals and guaiacs per Dr. Arline Asp or GYN or primary care. Happy to see back p.r.n.  Consider a small bowel follow-through next if her pain continues, otherwise, await pathology to determine future colonic screening. Dictated by:   Petra Kuba, M.D. Attending Physician:  Nelda Marseille DD:  10/01/01 TD:  10/04/01 Job: [id number]

## 2010-08-09 NOTE — Op Note (Signed)
NAME:  Jade Mooney, Jade Mooney NO.:  [id number]   MEDICAL RECORD NO.:  [id number]          PATIENT TYPE:  INP   LOCATION:  0012                         FACILITY:  [facility]   PHYSICIAN:  Jade Mooney, M.D.DATE OF BIRTH:  1946-03-12   DATE OF PROCEDURE:  01/13/2006  DATE OF DISCHARGE:                                 OPERATIVE REPORT   PREOPERATIVE DIAGNOSIS:  Left knee end stage osteoarthritis.   POSTOPERATIVE DIAGNOSIS:  Left knee end stage osteoarthritis.   PROCEDURE:  Left total knee arthroplasty.   SURGEON:  Jade Mooney, M.D.   ASSISTANT:  Jade Mooney, P.A.-C.   ANESTHESIA:  Spinal Duramorph.   ESTIMATED BLOOD LOSS:  Less than 100 mL.   DRAINS:  Two medium Hemovacs.   COMPLICATIONS:  None.   TOURNIQUET TIME:  1 hour 25 minutes at 300 mmHg.   OPERATIVE IMPLANTS:  [employer] and [employer].  Sigma.  Size 3 femur, size  3 tibia, 10 mm posterior stabilized rotating platform tibial insert and a 32  mm all polyethylene patella, all cemented.   PROCEDURE IN DETAIL:  The patient was counseled in the holding area, the  correct side was identified.  IV was started, antibiotics were given.  The  patient was then taken to the operating room and placed in the supine  position.  He was then turned lateral where the spinal anesthetic was  administered.  Following this, a Foley catheter was placed utilizing sterile  technique by the OR circulating nurse.  The operative extremity was well  padded.  The left lower extremity was elevated.  She had full extension,  flexion to 125.  It was prepped with DuraPrep and draped in a sterile  fashion.  It was exsanguinated with an Esmarch and the tourniquet inflated  to 300 mmHg.   A straight midline incision was made through the skin and subcutaneous  tissue.  Medial and lateral soft tissue flaps were developed.  A medial  parapatellar arthrotomy was performed and the proximal medial tibial was  exposed.  We  were very careful not to do much of a release due to the fact  she has just a slight valgus knee.  The patella was retracted but no  everted, the knee was flexed.  We found that she had end stage arthritis  changes with bone against bone on the lateral side, lateral medial side, and  advanced on the patellofemoral with end stage changes.  At this point in  time, the starting hole was made in the distal femur, the canal was  irrigated until the effluent was clear, extramedullary guide was gently  placed.  We chose to take a 5 degree valgus cut, initially took 11 mm off  the distal femur.  The distal femur was found to be a size 3.  Rotational  marks were made and the distal femur was cut to fit a size 3.  Medial and  lateral menisci were removed.  The geniculate vessels were coagulated.  The  neurovascular structures were protected throughout the entire case.  The  tibia was resected.  The proximal  tibia was found to be a size 3.  A  starting hole was made, the step reamer was utilized, the canal was  irrigated until the effluent was clear.  An intramedullary guide was gently  placed.  We chose a 0 degree slope with a 2 mm cut based upon the medial  side.  Posteromedial and posterolateral osteophytes were removed.   At this point in time, with flexion extension blocks, we were satisfactory  in flexion but extension was tight.  Therefore, we took another 2 mm off the  distal femur.  Now with the flexion extension blocks with 2 mm, we had  excellent flexion and extension gaps, perfectly balanced.  The tibial  baseplate was applied.  The coverage and rotation was set.  A reamer punch  was then utilized.  The femoral box cut was now prepared.  At this time, a  size 3 femur, size 3 tibia, with a 10 insert, we had excellent range of  motion and soft tissue balance, stable to varus and valgus stress.  The  patella was found to be a size 32.  The appropriate amount of bone was  resected.  The  patella was fit to a size 32 and locking holes were made.  At  this time, the knee was irrigated with pulsatile lavage.  Utilizing the  Moder and Katrinka Blazing technique, all components were cemented into place, size 3  tibia, size 3 femur, with a 10 trial insert, and a 32 patella.  After the  cement had cured, excess cement was removed, the knee was irrigated.  We had  a well balanced knee with a trial insert of 10.  The tibial trial was  removed, the tibia was subluxed anteriorly, and the final 10 mm posterior  stabilized rotating platform tibial insert was implanted.  The knee was then  placed through a gentle range of motion and was stable to varus and valgus  stress, excellent gaps, patellofemoral track was anatomic.   The knee was again irrigated.  Two medium Hemovac drains were placed.  Sequential closure of the layers was closed, the arthrotomy Vicryl, subcu  with Vicryl, the skin was closed with subcuticular Monocryl sutures.  Steri-  Strips were applied.  The drains were hooked to suction.  A sterile  compressive dressing was applied.  The tourniquet was deflated.  There was  normal circulation at the foot and ankle at the end of the case.  She was  awakened and taken to the PACU in stable condition.  Sponge and needle  counts were correct.  There were no complications or problems.  To help with  surgery and decision making, Mr. Jade Mooney was needed.           ______________________________  Jade Mooney, M.D.     RAC/MEDQ  D:  01/13/2006  T:  01/14/2006  Job:  [id number]

## 2010-11-05 ENCOUNTER — Other Ambulatory Visit (HOSPITAL_COMMUNITY): Payer: Self-pay | Admitting: Oncology

## 2010-11-05 ENCOUNTER — Other Ambulatory Visit: Payer: Self-pay | Admitting: Family Medicine

## 2010-11-05 DIAGNOSIS — Z1231 Encounter for screening mammogram for malignant neoplasm of breast: Secondary | ICD-10-CM

## 2010-11-15 ENCOUNTER — Ambulatory Visit
Admission: RE | Admit: 2010-11-15 | Discharge: 2010-11-15 | Disposition: A | Discharge status: Home / Self Care | Payer: BLUE CROSS/BLUE SHIELD | Source: Ambulatory Visit | Attending: Oncology | Admitting: Oncology

## 2010-11-15 DIAGNOSIS — Z1231 Encounter for screening mammogram for malignant neoplasm of breast: Secondary | ICD-10-CM

## 2011-01-17 ENCOUNTER — Other Ambulatory Visit (HOSPITAL_COMMUNITY): Payer: Self-pay | Admitting: Oncology

## 2011-01-17 ENCOUNTER — Encounter (HOSPITAL_BASED_OUTPATIENT_CLINIC_OR_DEPARTMENT_OTHER): Payer: Medicare Other | Admitting: Oncology

## 2011-01-17 DIAGNOSIS — Z853 Personal history of malignant neoplasm of breast: Secondary | ICD-10-CM

## 2011-01-17 DIAGNOSIS — C787 Secondary malignant neoplasm of liver and intrahepatic bile duct: Secondary | ICD-10-CM

## 2011-01-17 DIAGNOSIS — M25559 Pain in unspecified hip: Secondary | ICD-10-CM

## 2011-01-17 LAB — COMPREHENSIVE METABOLIC PANEL
ALT: 17 U/L (ref 0–35)
AST: 21 U/L (ref 0–37)
Albumin: 4.7 g/dL (ref 3.5–5.2)
Alkaline Phosphatase: 75 U/L (ref 39–117)
BUN: 19 mg/dL (ref 6–23)
CO2: 31 mEq/L (ref 19–32)
Calcium: 9.7 mg/dL (ref 8.4–10.5)
Chloride: 101 mEq/L (ref 96–112)
Creatinine, Ser: 0.87 mg/dL (ref 0.50–1.10)
Glucose, Bld: 68 mg/dL — ABNORMAL LOW (ref 70–99)
Potassium: 4.3 mEq/L (ref 3.5–5.3)
Sodium: 141 mEq/L (ref 135–145)
Total Bilirubin: 0.3 mg/dL (ref 0.3–1.2)
Total Protein: 6.9 g/dL (ref 6.0–8.3)

## 2011-01-17 LAB — CBC WITH DIFFERENTIAL/PLATELET
BASO%: 0.5 % (ref 0.0–2.0)
Basophils Absolute: 0 10*3/uL (ref 0.0–0.1)
EOS%: 2.1 % (ref 0.0–7.0)
Eosinophils Absolute: 0.1 10*3/uL (ref 0.0–0.5)
HCT: 42.5 % (ref 34.8–46.6)
HGB: 14.3 g/dL (ref 11.6–15.9)
LYMPH%: 30 % (ref 14.0–49.7)
MCH: 29.2 pg (ref 25.1–34.0)
MCHC: 33.6 g/dL (ref 31.5–36.0)
MCV: 86.8 fL (ref 79.5–101.0)
MONO#: 0.5 10*3/uL (ref 0.1–0.9)
MONO%: 9.6 % (ref 0.0–14.0)
NEUT#: 3 10*3/uL (ref 1.5–6.5)
NEUT%: 57.8 % (ref 38.4–76.8)
Platelets: 252 10*3/uL (ref 145–400)
RBC: 4.89 10*6/uL (ref 3.70–5.45)
RDW: 13.3 % (ref 11.2–14.5)
WBC: 5.1 10*3/uL (ref 3.9–10.3)
lymph#: 1.5 10*3/uL (ref 0.9–3.3)

## 2011-01-17 LAB — LACTATE DEHYDROGENASE: LDH: 138 U/L (ref 94–250)

## 2011-04-07 DIAGNOSIS — H35359 Cystoid macular degeneration, unspecified eye: Secondary | ICD-10-CM | POA: Diagnosis not present

## 2011-04-07 DIAGNOSIS — H353190 Nonexudative age-related macular degeneration, unspecified eye, stage unspecified: Secondary | ICD-10-CM | POA: Diagnosis not present

## 2011-05-02 DIAGNOSIS — N952 Postmenopausal atrophic vaginitis: Secondary | ICD-10-CM | POA: Diagnosis not present

## 2011-05-02 DIAGNOSIS — R3989 Other symptoms and signs involving the genitourinary system: Secondary | ICD-10-CM | POA: Diagnosis not present

## 2011-05-05 DIAGNOSIS — R3989 Other symptoms and signs involving the genitourinary system: Secondary | ICD-10-CM | POA: Diagnosis not present

## 2011-07-11 DIAGNOSIS — E782 Mixed hyperlipidemia: Secondary | ICD-10-CM | POA: Diagnosis not present

## 2011-07-11 DIAGNOSIS — Z79899 Other long term (current) drug therapy: Secondary | ICD-10-CM | POA: Diagnosis not present

## 2011-07-11 DIAGNOSIS — E039 Hypothyroidism, unspecified: Secondary | ICD-10-CM | POA: Diagnosis not present

## 2011-10-06 DIAGNOSIS — H524 Presbyopia: Secondary | ICD-10-CM | POA: Diagnosis not present

## 2011-10-06 DIAGNOSIS — H353190 Nonexudative age-related macular degeneration, unspecified eye, stage unspecified: Secondary | ICD-10-CM | POA: Diagnosis not present

## 2011-10-27 ENCOUNTER — Other Ambulatory Visit: Payer: Self-pay | Admitting: Family Medicine

## 2011-10-27 DIAGNOSIS — Z1231 Encounter for screening mammogram for malignant neoplasm of breast: Secondary | ICD-10-CM

## 2011-12-16 ENCOUNTER — Ambulatory Visit
Admission: RE | Admit: 2011-12-16 | Discharge: 2011-12-16 | Disposition: A | Discharge status: Home / Self Care | Payer: Medicare Other | Source: Ambulatory Visit | Attending: Family Medicine | Admitting: Family Medicine

## 2011-12-16 DIAGNOSIS — Z1231 Encounter for screening mammogram for malignant neoplasm of breast: Secondary | ICD-10-CM

## 2011-12-31 DIAGNOSIS — Z23 Encounter for immunization: Secondary | ICD-10-CM | POA: Diagnosis not present

## 2011-12-31 DIAGNOSIS — E7800 Pure hypercholesterolemia, unspecified: Secondary | ICD-10-CM | POA: Diagnosis not present

## 2011-12-31 DIAGNOSIS — Z79899 Other long term (current) drug therapy: Secondary | ICD-10-CM | POA: Diagnosis not present

## 2012-01-09 ENCOUNTER — Other Ambulatory Visit: Payer: Self-pay | Admitting: Gastroenterology

## 2012-01-09 DIAGNOSIS — Z86010 Personal history of colonic polyps: Secondary | ICD-10-CM | POA: Diagnosis not present

## 2012-01-09 DIAGNOSIS — Z09 Encounter for follow-up examination after completed treatment for conditions other than malignant neoplasm: Secondary | ICD-10-CM | POA: Diagnosis not present

## 2012-01-09 DIAGNOSIS — D126 Benign neoplasm of colon, unspecified: Secondary | ICD-10-CM | POA: Diagnosis not present

## 2012-01-09 DIAGNOSIS — K5730 Diverticulosis of large intestine without perforation or abscess without bleeding: Secondary | ICD-10-CM | POA: Diagnosis not present

## 2012-02-11 DIAGNOSIS — E7800 Pure hypercholesterolemia, unspecified: Secondary | ICD-10-CM | POA: Diagnosis not present

## 2012-02-11 DIAGNOSIS — E039 Hypothyroidism, unspecified: Secondary | ICD-10-CM | POA: Diagnosis not present

## 2012-02-11 DIAGNOSIS — E559 Vitamin D deficiency, unspecified: Secondary | ICD-10-CM | POA: Diagnosis not present

## 2012-04-08 DIAGNOSIS — H353190 Nonexudative age-related macular degeneration, unspecified eye, stage unspecified: Secondary | ICD-10-CM | POA: Diagnosis not present

## 2012-04-22 DIAGNOSIS — M5126 Other intervertebral disc displacement, lumbar region: Secondary | ICD-10-CM | POA: Diagnosis not present

## 2012-04-22 DIAGNOSIS — M999 Biomechanical lesion, unspecified: Secondary | ICD-10-CM | POA: Diagnosis not present

## 2012-04-22 DIAGNOSIS — IMO0002 Reserved for concepts with insufficient information to code with codable children: Secondary | ICD-10-CM | POA: Diagnosis not present

## 2012-04-23 DIAGNOSIS — M999 Biomechanical lesion, unspecified: Secondary | ICD-10-CM | POA: Diagnosis not present

## 2012-04-23 DIAGNOSIS — M5126 Other intervertebral disc displacement, lumbar region: Secondary | ICD-10-CM | POA: Diagnosis not present

## 2012-04-23 DIAGNOSIS — IMO0002 Reserved for concepts with insufficient information to code with codable children: Secondary | ICD-10-CM | POA: Diagnosis not present

## 2012-04-26 DIAGNOSIS — IMO0002 Reserved for concepts with insufficient information to code with codable children: Secondary | ICD-10-CM | POA: Diagnosis not present

## 2012-04-26 DIAGNOSIS — M999 Biomechanical lesion, unspecified: Secondary | ICD-10-CM | POA: Diagnosis not present

## 2012-04-26 DIAGNOSIS — M5126 Other intervertebral disc displacement, lumbar region: Secondary | ICD-10-CM | POA: Diagnosis not present

## 2012-04-29 DIAGNOSIS — M999 Biomechanical lesion, unspecified: Secondary | ICD-10-CM | POA: Diagnosis not present

## 2012-04-29 DIAGNOSIS — M5126 Other intervertebral disc displacement, lumbar region: Secondary | ICD-10-CM | POA: Diagnosis not present

## 2012-04-29 DIAGNOSIS — IMO0002 Reserved for concepts with insufficient information to code with codable children: Secondary | ICD-10-CM | POA: Diagnosis not present

## 2012-05-03 DIAGNOSIS — IMO0002 Reserved for concepts with insufficient information to code with codable children: Secondary | ICD-10-CM | POA: Diagnosis not present

## 2012-05-03 DIAGNOSIS — M5126 Other intervertebral disc displacement, lumbar region: Secondary | ICD-10-CM | POA: Diagnosis not present

## 2012-05-03 DIAGNOSIS — M999 Biomechanical lesion, unspecified: Secondary | ICD-10-CM | POA: Diagnosis not present

## 2012-05-10 DIAGNOSIS — IMO0002 Reserved for concepts with insufficient information to code with codable children: Secondary | ICD-10-CM | POA: Diagnosis not present

## 2012-05-10 DIAGNOSIS — M999 Biomechanical lesion, unspecified: Secondary | ICD-10-CM | POA: Diagnosis not present

## 2012-05-10 DIAGNOSIS — M5126 Other intervertebral disc displacement, lumbar region: Secondary | ICD-10-CM | POA: Diagnosis not present

## 2012-06-29 DIAGNOSIS — IMO0002 Reserved for concepts with insufficient information to code with codable children: Secondary | ICD-10-CM | POA: Diagnosis not present

## 2012-06-29 DIAGNOSIS — M999 Biomechanical lesion, unspecified: Secondary | ICD-10-CM | POA: Diagnosis not present

## 2012-06-29 DIAGNOSIS — M5126 Other intervertebral disc displacement, lumbar region: Secondary | ICD-10-CM | POA: Diagnosis not present

## 2012-07-02 DIAGNOSIS — IMO0002 Reserved for concepts with insufficient information to code with codable children: Secondary | ICD-10-CM | POA: Diagnosis not present

## 2012-07-02 DIAGNOSIS — M5126 Other intervertebral disc displacement, lumbar region: Secondary | ICD-10-CM | POA: Diagnosis not present

## 2012-07-02 DIAGNOSIS — M999 Biomechanical lesion, unspecified: Secondary | ICD-10-CM | POA: Diagnosis not present

## 2012-07-05 DIAGNOSIS — E559 Vitamin D deficiency, unspecified: Secondary | ICD-10-CM | POA: Diagnosis not present

## 2012-07-05 DIAGNOSIS — Z79899 Other long term (current) drug therapy: Secondary | ICD-10-CM | POA: Diagnosis not present

## 2012-07-05 DIAGNOSIS — E7800 Pure hypercholesterolemia, unspecified: Secondary | ICD-10-CM | POA: Diagnosis not present

## 2012-07-06 DIAGNOSIS — M5126 Other intervertebral disc displacement, lumbar region: Secondary | ICD-10-CM | POA: Diagnosis not present

## 2012-07-06 DIAGNOSIS — IMO0002 Reserved for concepts with insufficient information to code with codable children: Secondary | ICD-10-CM | POA: Diagnosis not present

## 2012-07-06 DIAGNOSIS — M999 Biomechanical lesion, unspecified: Secondary | ICD-10-CM | POA: Diagnosis not present

## 2012-07-08 DIAGNOSIS — D492 Neoplasm of unspecified behavior of bone, soft tissue, and skin: Secondary | ICD-10-CM | POA: Diagnosis not present

## 2012-07-08 DIAGNOSIS — K219 Gastro-esophageal reflux disease without esophagitis: Secondary | ICD-10-CM | POA: Diagnosis not present

## 2012-07-08 DIAGNOSIS — Z0000 Encounter for general adult medical examination without abnormal findings: Secondary | ICD-10-CM | POA: Diagnosis not present

## 2012-07-08 DIAGNOSIS — M25559 Pain in unspecified hip: Secondary | ICD-10-CM | POA: Diagnosis not present

## 2012-07-08 DIAGNOSIS — J309 Allergic rhinitis, unspecified: Secondary | ICD-10-CM | POA: Diagnosis not present

## 2012-07-13 DIAGNOSIS — M545 Low back pain, unspecified: Secondary | ICD-10-CM | POA: Diagnosis not present

## 2012-07-13 DIAGNOSIS — M25569 Pain in unspecified knee: Secondary | ICD-10-CM | POA: Diagnosis not present

## 2012-07-13 DIAGNOSIS — M6281 Muscle weakness (generalized): Secondary | ICD-10-CM | POA: Diagnosis not present

## 2012-07-16 DIAGNOSIS — M25569 Pain in unspecified knee: Secondary | ICD-10-CM | POA: Diagnosis not present

## 2012-07-16 DIAGNOSIS — M6281 Muscle weakness (generalized): Secondary | ICD-10-CM | POA: Diagnosis not present

## 2012-07-16 DIAGNOSIS — M545 Low back pain, unspecified: Secondary | ICD-10-CM | POA: Diagnosis not present

## 2012-07-21 DIAGNOSIS — M545 Low back pain, unspecified: Secondary | ICD-10-CM | POA: Diagnosis not present

## 2012-07-21 DIAGNOSIS — D492 Neoplasm of unspecified behavior of bone, soft tissue, and skin: Secondary | ICD-10-CM | POA: Diagnosis not present

## 2012-07-21 DIAGNOSIS — M6281 Muscle weakness (generalized): Secondary | ICD-10-CM | POA: Diagnosis not present

## 2012-07-21 DIAGNOSIS — M25569 Pain in unspecified knee: Secondary | ICD-10-CM | POA: Diagnosis not present

## 2012-07-28 DIAGNOSIS — M25569 Pain in unspecified knee: Secondary | ICD-10-CM | POA: Diagnosis not present

## 2012-07-28 DIAGNOSIS — M545 Low back pain, unspecified: Secondary | ICD-10-CM | POA: Diagnosis not present

## 2012-07-28 DIAGNOSIS — M6281 Muscle weakness (generalized): Secondary | ICD-10-CM | POA: Diagnosis not present

## 2012-08-18 DIAGNOSIS — M25569 Pain in unspecified knee: Secondary | ICD-10-CM | POA: Diagnosis not present

## 2012-08-18 DIAGNOSIS — M545 Low back pain, unspecified: Secondary | ICD-10-CM | POA: Diagnosis not present

## 2012-08-18 DIAGNOSIS — M6281 Muscle weakness (generalized): Secondary | ICD-10-CM | POA: Diagnosis not present

## 2012-08-20 DIAGNOSIS — M545 Low back pain, unspecified: Secondary | ICD-10-CM | POA: Diagnosis not present

## 2012-08-20 DIAGNOSIS — M25569 Pain in unspecified knee: Secondary | ICD-10-CM | POA: Diagnosis not present

## 2012-08-20 DIAGNOSIS — M6281 Muscle weakness (generalized): Secondary | ICD-10-CM | POA: Diagnosis not present

## 2012-08-23 DIAGNOSIS — M545 Low back pain, unspecified: Secondary | ICD-10-CM | POA: Diagnosis not present

## 2012-08-23 DIAGNOSIS — M6281 Muscle weakness (generalized): Secondary | ICD-10-CM | POA: Diagnosis not present

## 2012-08-23 DIAGNOSIS — M25569 Pain in unspecified knee: Secondary | ICD-10-CM | POA: Diagnosis not present

## 2012-08-27 DIAGNOSIS — M6281 Muscle weakness (generalized): Secondary | ICD-10-CM | POA: Diagnosis not present

## 2012-08-27 DIAGNOSIS — M25569 Pain in unspecified knee: Secondary | ICD-10-CM | POA: Diagnosis not present

## 2012-08-27 DIAGNOSIS — M545 Low back pain, unspecified: Secondary | ICD-10-CM | POA: Diagnosis not present

## 2012-08-31 DIAGNOSIS — M545 Low back pain, unspecified: Secondary | ICD-10-CM | POA: Diagnosis not present

## 2012-08-31 DIAGNOSIS — M25569 Pain in unspecified knee: Secondary | ICD-10-CM | POA: Diagnosis not present

## 2012-08-31 DIAGNOSIS — M6281 Muscle weakness (generalized): Secondary | ICD-10-CM | POA: Diagnosis not present

## 2012-09-07 DIAGNOSIS — M545 Low back pain, unspecified: Secondary | ICD-10-CM | POA: Diagnosis not present

## 2012-09-07 DIAGNOSIS — M25569 Pain in unspecified knee: Secondary | ICD-10-CM | POA: Diagnosis not present

## 2012-09-07 DIAGNOSIS — M6281 Muscle weakness (generalized): Secondary | ICD-10-CM | POA: Diagnosis not present

## 2012-09-14 DIAGNOSIS — M545 Low back pain, unspecified: Secondary | ICD-10-CM | POA: Diagnosis not present

## 2012-09-14 DIAGNOSIS — M25569 Pain in unspecified knee: Secondary | ICD-10-CM | POA: Diagnosis not present

## 2012-09-14 DIAGNOSIS — M6281 Muscle weakness (generalized): Secondary | ICD-10-CM | POA: Diagnosis not present

## 2012-09-23 DIAGNOSIS — M545 Low back pain, unspecified: Secondary | ICD-10-CM | POA: Diagnosis not present

## 2012-09-23 DIAGNOSIS — M6281 Muscle weakness (generalized): Secondary | ICD-10-CM | POA: Diagnosis not present

## 2012-09-23 DIAGNOSIS — M25569 Pain in unspecified knee: Secondary | ICD-10-CM | POA: Diagnosis not present

## 2012-09-28 DIAGNOSIS — M6281 Muscle weakness (generalized): Secondary | ICD-10-CM | POA: Diagnosis not present

## 2012-09-28 DIAGNOSIS — M545 Low back pain, unspecified: Secondary | ICD-10-CM | POA: Diagnosis not present

## 2012-09-28 DIAGNOSIS — M25569 Pain in unspecified knee: Secondary | ICD-10-CM | POA: Diagnosis not present

## 2012-10-07 DIAGNOSIS — M25569 Pain in unspecified knee: Secondary | ICD-10-CM | POA: Diagnosis not present

## 2012-10-07 DIAGNOSIS — M545 Low back pain, unspecified: Secondary | ICD-10-CM | POA: Diagnosis not present

## 2012-10-07 DIAGNOSIS — M6281 Muscle weakness (generalized): Secondary | ICD-10-CM | POA: Diagnosis not present

## 2012-10-13 DIAGNOSIS — M545 Low back pain, unspecified: Secondary | ICD-10-CM | POA: Diagnosis not present

## 2012-10-13 DIAGNOSIS — M25569 Pain in unspecified knee: Secondary | ICD-10-CM | POA: Diagnosis not present

## 2012-10-13 DIAGNOSIS — M549 Dorsalgia, unspecified: Secondary | ICD-10-CM | POA: Diagnosis not present

## 2012-10-13 DIAGNOSIS — R079 Chest pain, unspecified: Secondary | ICD-10-CM | POA: Diagnosis not present

## 2012-10-13 DIAGNOSIS — R071 Chest pain on breathing: Secondary | ICD-10-CM | POA: Diagnosis not present

## 2012-10-13 DIAGNOSIS — M6281 Muscle weakness (generalized): Secondary | ICD-10-CM | POA: Diagnosis not present

## 2012-10-14 DIAGNOSIS — M1710 Unilateral primary osteoarthritis, unspecified knee: Secondary | ICD-10-CM | POA: Diagnosis not present

## 2012-10-14 DIAGNOSIS — E782 Mixed hyperlipidemia: Secondary | ICD-10-CM | POA: Diagnosis not present

## 2012-10-14 DIAGNOSIS — IMO0002 Reserved for concepts with insufficient information to code with codable children: Secondary | ICD-10-CM | POA: Diagnosis not present

## 2012-10-14 DIAGNOSIS — Z006 Encounter for examination for normal comparison and control in clinical research program: Secondary | ICD-10-CM | POA: Diagnosis not present

## 2012-10-20 DIAGNOSIS — M6281 Muscle weakness (generalized): Secondary | ICD-10-CM | POA: Diagnosis not present

## 2012-10-20 DIAGNOSIS — M545 Low back pain, unspecified: Secondary | ICD-10-CM | POA: Diagnosis not present

## 2012-10-20 DIAGNOSIS — M25569 Pain in unspecified knee: Secondary | ICD-10-CM | POA: Diagnosis not present

## 2012-10-27 DIAGNOSIS — M25569 Pain in unspecified knee: Secondary | ICD-10-CM | POA: Diagnosis not present

## 2012-10-27 DIAGNOSIS — M6281 Muscle weakness (generalized): Secondary | ICD-10-CM | POA: Diagnosis not present

## 2012-10-27 DIAGNOSIS — M545 Low back pain, unspecified: Secondary | ICD-10-CM | POA: Diagnosis not present

## 2012-11-04 DIAGNOSIS — M25569 Pain in unspecified knee: Secondary | ICD-10-CM | POA: Diagnosis not present

## 2012-11-04 DIAGNOSIS — M545 Low back pain, unspecified: Secondary | ICD-10-CM | POA: Diagnosis not present

## 2012-11-04 DIAGNOSIS — M6281 Muscle weakness (generalized): Secondary | ICD-10-CM | POA: Diagnosis not present

## 2012-11-30 ENCOUNTER — Other Ambulatory Visit: Payer: Self-pay

## 2012-11-30 DIAGNOSIS — Z1231 Encounter for screening mammogram for malignant neoplasm of breast: Secondary | ICD-10-CM

## 2012-11-30 DIAGNOSIS — Z9011 Acquired absence of right breast and nipple: Secondary | ICD-10-CM

## 2012-12-02 DIAGNOSIS — M545 Low back pain, unspecified: Secondary | ICD-10-CM | POA: Diagnosis not present

## 2012-12-02 DIAGNOSIS — IMO0002 Reserved for concepts with insufficient information to code with codable children: Secondary | ICD-10-CM | POA: Diagnosis not present

## 2012-12-02 DIAGNOSIS — M25569 Pain in unspecified knee: Secondary | ICD-10-CM | POA: Diagnosis not present

## 2012-12-02 DIAGNOSIS — M6281 Muscle weakness (generalized): Secondary | ICD-10-CM | POA: Diagnosis not present

## 2012-12-07 DIAGNOSIS — H353190 Nonexudative age-related macular degeneration, unspecified eye, stage unspecified: Secondary | ICD-10-CM | POA: Diagnosis not present

## 2012-12-07 DIAGNOSIS — Z23 Encounter for immunization: Secondary | ICD-10-CM | POA: Diagnosis not present

## 2012-12-21 ENCOUNTER — Ambulatory Visit
Admission: RE | Admit: 2012-12-21 | Discharge: 2012-12-21 | Disposition: A | Discharge status: Home / Self Care | Payer: Medicare Other | Source: Ambulatory Visit

## 2012-12-21 ENCOUNTER — Ambulatory Visit: Payer: Medicare Other

## 2012-12-21 DIAGNOSIS — Z1231 Encounter for screening mammogram for malignant neoplasm of breast: Secondary | ICD-10-CM

## 2012-12-21 DIAGNOSIS — Z9011 Acquired absence of right breast and nipple: Secondary | ICD-10-CM

## 2013-01-14 DIAGNOSIS — E7800 Pure hypercholesterolemia, unspecified: Secondary | ICD-10-CM | POA: Diagnosis not present

## 2013-01-14 DIAGNOSIS — Z79899 Other long term (current) drug therapy: Secondary | ICD-10-CM | POA: Diagnosis not present

## 2013-01-19 DIAGNOSIS — L570 Actinic keratosis: Secondary | ICD-10-CM | POA: Diagnosis not present

## 2013-01-19 DIAGNOSIS — E7800 Pure hypercholesterolemia, unspecified: Secondary | ICD-10-CM | POA: Diagnosis not present

## 2013-01-19 DIAGNOSIS — M722 Plantar fascial fibromatosis: Secondary | ICD-10-CM | POA: Diagnosis not present

## 2013-03-25 DIAGNOSIS — M722 Plantar fascial fibromatosis: Secondary | ICD-10-CM | POA: Diagnosis not present

## 2013-03-25 DIAGNOSIS — J069 Acute upper respiratory infection, unspecified: Secondary | ICD-10-CM | POA: Diagnosis not present

## 2013-03-30 DIAGNOSIS — M722 Plantar fascial fibromatosis: Secondary | ICD-10-CM | POA: Diagnosis not present

## 2013-03-30 DIAGNOSIS — IMO0001 Reserved for inherently not codable concepts without codable children: Secondary | ICD-10-CM | POA: Diagnosis not present

## 2013-04-01 DIAGNOSIS — M722 Plantar fascial fibromatosis: Secondary | ICD-10-CM | POA: Diagnosis not present

## 2013-04-01 DIAGNOSIS — IMO0001 Reserved for inherently not codable concepts without codable children: Secondary | ICD-10-CM | POA: Diagnosis not present

## 2013-04-04 DIAGNOSIS — M722 Plantar fascial fibromatosis: Secondary | ICD-10-CM | POA: Diagnosis not present

## 2013-04-04 DIAGNOSIS — IMO0001 Reserved for inherently not codable concepts without codable children: Secondary | ICD-10-CM | POA: Diagnosis not present

## 2013-04-08 DIAGNOSIS — M722 Plantar fascial fibromatosis: Secondary | ICD-10-CM | POA: Diagnosis not present

## 2013-04-08 DIAGNOSIS — IMO0001 Reserved for inherently not codable concepts without codable children: Secondary | ICD-10-CM | POA: Diagnosis not present

## 2013-04-11 DIAGNOSIS — M722 Plantar fascial fibromatosis: Secondary | ICD-10-CM | POA: Diagnosis not present

## 2013-04-11 DIAGNOSIS — IMO0001 Reserved for inherently not codable concepts without codable children: Secondary | ICD-10-CM | POA: Diagnosis not present

## 2013-04-13 DIAGNOSIS — M79609 Pain in unspecified limb: Secondary | ICD-10-CM | POA: Diagnosis not present

## 2013-05-30 DIAGNOSIS — M79609 Pain in unspecified limb: Secondary | ICD-10-CM | POA: Diagnosis not present

## 2013-06-16 DIAGNOSIS — Z0000 Encounter for general adult medical examination without abnormal findings: Secondary | ICD-10-CM | POA: Diagnosis not present

## 2013-06-16 DIAGNOSIS — Z79899 Other long term (current) drug therapy: Secondary | ICD-10-CM | POA: Diagnosis not present

## 2013-06-16 DIAGNOSIS — E782 Mixed hyperlipidemia: Secondary | ICD-10-CM | POA: Diagnosis not present

## 2013-06-22 DIAGNOSIS — E7800 Pure hypercholesterolemia, unspecified: Secondary | ICD-10-CM | POA: Diagnosis not present

## 2013-06-22 DIAGNOSIS — L2089 Other atopic dermatitis: Secondary | ICD-10-CM | POA: Diagnosis not present

## 2013-06-22 DIAGNOSIS — E039 Hypothyroidism, unspecified: Secondary | ICD-10-CM | POA: Diagnosis not present

## 2013-06-22 DIAGNOSIS — E559 Vitamin D deficiency, unspecified: Secondary | ICD-10-CM | POA: Diagnosis not present

## 2013-07-14 DIAGNOSIS — W57XXXA Bitten or stung by nonvenomous insect and other nonvenomous arthropods, initial encounter: Secondary | ICD-10-CM | POA: Diagnosis not present

## 2013-07-14 DIAGNOSIS — T148 Other injury of unspecified body region: Secondary | ICD-10-CM | POA: Diagnosis not present

## 2013-08-26 DIAGNOSIS — R1084 Generalized abdominal pain: Secondary | ICD-10-CM | POA: Diagnosis not present

## 2013-09-14 DIAGNOSIS — E039 Hypothyroidism, unspecified: Secondary | ICD-10-CM | POA: Diagnosis not present

## 2013-09-19 DIAGNOSIS — R1084 Generalized abdominal pain: Secondary | ICD-10-CM | POA: Diagnosis not present

## 2013-09-19 DIAGNOSIS — K8020 Calculus of gallbladder without cholecystitis without obstruction: Secondary | ICD-10-CM | POA: Diagnosis not present

## 2013-09-21 DIAGNOSIS — K602 Anal fissure, unspecified: Secondary | ICD-10-CM | POA: Diagnosis not present

## 2013-09-21 DIAGNOSIS — R109 Unspecified abdominal pain: Secondary | ICD-10-CM | POA: Diagnosis not present

## 2013-09-21 DIAGNOSIS — M2669 Other specified disorders of temporomandibular joint: Secondary | ICD-10-CM | POA: Diagnosis not present

## 2013-09-22 DIAGNOSIS — R110 Nausea: Secondary | ICD-10-CM | POA: Diagnosis not present

## 2013-09-22 DIAGNOSIS — R109 Unspecified abdominal pain: Secondary | ICD-10-CM | POA: Diagnosis not present

## 2013-09-22 DIAGNOSIS — R932 Abnormal findings on diagnostic imaging of liver and biliary tract: Secondary | ICD-10-CM | POA: Diagnosis not present

## 2013-09-27 ENCOUNTER — Other Ambulatory Visit: Payer: Self-pay | Admitting: Gastroenterology

## 2013-09-27 DIAGNOSIS — K449 Diaphragmatic hernia without obstruction or gangrene: Secondary | ICD-10-CM | POA: Diagnosis not present

## 2013-09-27 DIAGNOSIS — D649 Anemia, unspecified: Secondary | ICD-10-CM | POA: Diagnosis not present

## 2013-09-27 DIAGNOSIS — R1084 Generalized abdominal pain: Secondary | ICD-10-CM

## 2013-09-27 DIAGNOSIS — R1013 Epigastric pain: Secondary | ICD-10-CM | POA: Diagnosis not present

## 2013-09-27 DIAGNOSIS — D509 Iron deficiency anemia, unspecified: Secondary | ICD-10-CM

## 2013-09-29 ENCOUNTER — Ambulatory Visit
Admission: RE | Admit: 2013-09-29 | Discharge: 2013-09-29 | Disposition: A | Discharge status: Home / Self Care | Payer: Medicare Other | Source: Ambulatory Visit | Attending: Gastroenterology | Admitting: Gastroenterology

## 2013-09-29 DIAGNOSIS — R1084 Generalized abdominal pain: Secondary | ICD-10-CM

## 2013-09-29 DIAGNOSIS — R109 Unspecified abdominal pain: Secondary | ICD-10-CM | POA: Diagnosis not present

## 2013-09-29 DIAGNOSIS — D509 Iron deficiency anemia, unspecified: Secondary | ICD-10-CM

## 2013-09-29 MED ORDER — IOHEXOL 300 MG/ML  SOLN
100.0000 mL | Freq: Once | INTRAMUSCULAR | Status: AC | PRN
  Administered 2013-09-29: 100 mL via INTRAVENOUS

## 2013-09-30 ENCOUNTER — Other Ambulatory Visit: Payer: Self-pay | Admitting: Oncology

## 2013-09-30 DIAGNOSIS — R933 Abnormal findings on diagnostic imaging of other parts of digestive tract: Secondary | ICD-10-CM | POA: Diagnosis not present

## 2013-10-03 ENCOUNTER — Telehealth: Payer: Self-pay | Admitting: *Deleted

## 2013-10-03 ENCOUNTER — Other Ambulatory Visit: Payer: Self-pay | Admitting: Oncology

## 2013-10-04 ENCOUNTER — Telehealth: Payer: Self-pay | Admitting: Oncology

## 2013-10-04 NOTE — Telephone Encounter (Signed)
S/W PATIENT AND GAVE NP APPT FOR 08/21 @ 11 W/DR. MAGRINAT.  REFERRING DR. MARC MAGOD DX- CANCER UNKNOWN PRIMARY

## 2013-10-05 ENCOUNTER — Other Ambulatory Visit (HOSPITAL_COMMUNITY): Payer: Self-pay | Admitting: Gastroenterology

## 2013-10-05 ENCOUNTER — Telehealth: Payer: Self-pay | Admitting: *Deleted

## 2013-10-05 DIAGNOSIS — C799 Secondary malignant neoplasm of unspecified site: Secondary | ICD-10-CM

## 2013-10-05 NOTE — Telephone Encounter (Signed)
This RN spoke with pt per need to reschedule new patient follow up per MD discussion with Dr Watt Climes and call from [facility] at Dr Hoyle Sauer.

## 2013-10-07 ENCOUNTER — Encounter (HOSPITAL_COMMUNITY): Payer: Self-pay | Admitting: Pharmacy Technician

## 2013-10-07 ENCOUNTER — Other Ambulatory Visit: Payer: Self-pay | Admitting: Radiology

## 2013-10-10 ENCOUNTER — Encounter (HOSPITAL_COMMUNITY): Payer: Self-pay

## 2013-10-10 ENCOUNTER — Ambulatory Visit (HOSPITAL_COMMUNITY)
Admission: RE | Admit: 2013-10-10 | Discharge: 2013-10-10 | Disposition: A | Discharge status: Home / Self Care | Payer: Medicare Other | Source: Ambulatory Visit | Attending: Gastroenterology | Admitting: Gastroenterology

## 2013-10-10 DIAGNOSIS — C801 Malignant (primary) neoplasm, unspecified: Secondary | ICD-10-CM | POA: Insufficient documentation

## 2013-10-10 DIAGNOSIS — C799 Secondary malignant neoplasm of unspecified site: Secondary | ICD-10-CM

## 2013-10-10 DIAGNOSIS — M25559 Pain in unspecified hip: Secondary | ICD-10-CM | POA: Insufficient documentation

## 2013-10-10 DIAGNOSIS — C7952 Secondary malignant neoplasm of bone marrow: Principal | ICD-10-CM

## 2013-10-10 DIAGNOSIS — M79609 Pain in unspecified limb: Secondary | ICD-10-CM | POA: Diagnosis not present

## 2013-10-10 DIAGNOSIS — G459 Transient cerebral ischemic attack, unspecified: Secondary | ICD-10-CM | POA: Insufficient documentation

## 2013-10-10 DIAGNOSIS — Z853 Personal history of malignant neoplasm of breast: Secondary | ICD-10-CM | POA: Diagnosis not present

## 2013-10-10 DIAGNOSIS — M899 Disorder of bone, unspecified: Secondary | ICD-10-CM | POA: Diagnosis not present

## 2013-10-10 DIAGNOSIS — C7951 Secondary malignant neoplasm of bone: Secondary | ICD-10-CM | POA: Diagnosis not present

## 2013-10-10 DIAGNOSIS — M949 Disorder of cartilage, unspecified: Secondary | ICD-10-CM

## 2013-10-10 HISTORY — DX: Transient cerebral ischemic attack, unspecified: G45.9

## 2013-10-10 HISTORY — DX: Malignant (primary) neoplasm, unspecified: C80.1

## 2013-10-10 LAB — CBC
HCT: 33.9 % — ABNORMAL LOW (ref 36.0–46.0)
Hemoglobin: 10.7 g/dL — ABNORMAL LOW (ref 12.0–15.0)
MCH: 27 pg (ref 26.0–34.0)
MCHC: 31.6 g/dL (ref 30.0–36.0)
MCV: 85.4 fL (ref 78.0–100.0)
Platelets: 240 10*3/uL (ref 150–400)
RBC: 3.97 MIL/uL (ref 3.87–5.11)
RDW: 16 % — ABNORMAL HIGH (ref 11.5–15.5)
WBC: 5 10*3/uL (ref 4.0–10.5)

## 2013-10-10 LAB — PROTIME-INR
INR: 1.03 (ref 0.00–1.49)
Prothrombin Time: 13.5 seconds (ref 11.6–15.2)

## 2013-10-10 LAB — APTT: aPTT: 34 seconds (ref 24–37)

## 2013-10-10 MED ORDER — FENTANYL CITRATE 0.05 MG/ML IJ SOLN
INTRAMUSCULAR | Status: AC
  Filled 2013-10-10: qty 2

## 2013-10-10 MED ORDER — MIDAZOLAM HCL 2 MG/2ML IJ SOLN
INTRAMUSCULAR | Status: AC
  Filled 2013-10-10: qty 4

## 2013-10-10 MED ORDER — SODIUM CHLORIDE 0.9 % IV SOLN
Freq: Once | INTRAVENOUS | Status: AC
Start: 2013-10-10 — End: 2013-10-10
  Administered 2013-10-10: 08:00:00 via INTRAVENOUS

## 2013-10-10 MED ORDER — FENTANYL CITRATE 0.05 MG/ML IJ SOLN
INTRAMUSCULAR | Status: AC | PRN
  Administered 2013-10-10: 50 ug via INTRAVENOUS
  Administered 2013-10-10: 25 ug via INTRAVENOUS

## 2013-10-10 MED ORDER — LIDOCAINE HCL 1 % IJ SOLN
INTRAMUSCULAR | Status: AC
  Filled 2013-10-10: qty 10

## 2013-10-10 MED ORDER — SODIUM CHLORIDE 0.9 % IV SOLN
INTRAVENOUS | Status: AC | PRN
  Administered 2013-10-10: 10 mL/h via INTRAVENOUS

## 2013-10-10 MED ORDER — MIDAZOLAM HCL 2 MG/2ML IJ SOLN
INTRAMUSCULAR | Status: AC | PRN
  Administered 2013-10-10: 0.5 mg via INTRAVENOUS
  Administered 2013-10-10: 1 mg via INTRAVENOUS

## 2013-10-10 NOTE — Procedures (Signed)
Successful RT ILIAC BONE CORE BX NO COMP STABLE PATH PENDING FULL REPORT IN PACS

## 2013-10-10 NOTE — Sedation Documentation (Signed)
Patient denies pain and is resting comfortably.  

## 2013-10-10 NOTE — Discharge Instructions (Signed)
Needle Biopsy °Care After °These instructions give you information on caring for yourself after your procedure. Your doctor may also give you more specific instructions. Call your doctor if you have any problems or questions after your procedure. °HOME CARE °· Rest for 4 hours after your biopsy, except for getting up to go to the bathroom or as told. °· Keep the places where the needles were put in clean and dry. °¨ Do not put powder or lotion on the sites. °¨ Do not shower until 24 hours after the test. Remove all bandages (dressings) before showering. °¨ Remove all bandages at least once every day. Gently clean the sites with soap and water. Keep putting a new bandage on until the skin is closed. °Finding out the results of your test °Ask your doctor when your test results will be ready. Make sure you follow up and get the test results. °GET HELP RIGHT AWAY IF:  °· You have shortness of breath or trouble breathing. °· You have pain or cramping in your belly (abdomen). °· You feel sick to your stomach (nauseous) or throw up (vomit). °· Any of the places where the needles were put in: °¨ Are puffy (swollen) or red. °¨ Are sore or hot to the touch. °¨ Are draining yellowish-white fluid (pus). °¨ Are bleeding after 10 minutes of pressing down on the site. Have someone keep pressing on any place that is bleeding until you see a doctor. °· You have any unusual pain that will not stop. °· You have a fever. °If you go to the emergency room, tell the nurse that you had a biopsy. Take this paper with you to show the nurse. °MAKE SURE YOU:  °· Understand these instructions. °· Will watch your condition. °· Will get help right away if you are not doing well or get worse. °Document Released: 02/21/2008 Document Revised: 06/02/2011 Document Reviewed: 02/21/2008 °ExitCare® Patient Information ©2015 [employer]. This information is not intended to replace advice given to you by your health care provider. Make sure you discuss  any questions you have with your health care provider. ° °

## 2013-10-10 NOTE — H&P (Signed)
Jade Mooney is an 68 y.o. female.   Chief Complaint: Bilateral hip pain; back pain Hx breast Cancer 1997 Work up reveals bony mets Scheduled now for iliac bone biopsy  HPI: Breast Ca  No past medical history on file.  No past surgical history on file.  No family history on file. Social History:  has no tobacco, alcohol, and drug history on file.  Allergies:  Allergies  Allergen Reactions  . Amoxicillin Hives  . Percocet [Oxycodone-Acetaminophen] Nausea And Vomiting  . Percodan [Oxycodone-Aspirin] Nausea And Vomiting     (Not in a hospital admission)  Results for orders placed during the hospital encounter of 10/10/13 (from the past 48 hour(s))  APTT     Status: None   Collection Time    10/10/13  7:30 AM      Result Value Ref Range   aPTT 34  24 - 37 seconds  CBC     Status: Abnormal   Collection Time    10/10/13  7:30 AM      Result Value Ref Range   WBC 5.0  4.0 - 10.5 K/uL   RBC 3.97  3.87 - 5.11 MIL/uL   Hemoglobin 10.7 (*) 12.0 - 15.0 g/dL   HCT 33.9 (*) 36.0 - 46.0 %   MCV 85.4  78.0 - 100.0 fL   MCH 27.0  26.0 - 34.0 pg   MCHC 31.6  30.0 - 36.0 g/dL   RDW 16.0 (*) 11.5 - 15.5 %   Platelets 240  150 - 400 K/uL  PROTIME-INR     Status: None   Collection Time    10/10/13  7:30 AM      Result Value Ref Range   Prothrombin Time 13.5  11.6 - 15.2 seconds   INR 1.03  0.00 - 1.49   No results found.  Review of Systems  Constitutional: Positive for weight loss. Negative for fever.  Respiratory: Negative for shortness of breath.   Cardiovascular: Negative for chest pain.  Musculoskeletal: Positive for back pain and joint pain.  Neurological: Positive for weakness.  Psychiatric/Behavioral: Negative for substance abuse.    Blood pressure 132/93, pulse 77, temperature 98 F (36.7 C), temperature source Oral, resp. rate 18, height 5\' 4"  (1.626 m), weight 62.143 kg (137 lb), SpO2 100.00%. Physical Exam  Constitutional: She is oriented to person, place,  and time. She appears well-nourished.  Cardiovascular: Normal rate and regular rhythm.   No murmur heard. Respiratory: Effort normal and breath sounds normal. She has no wheezes.  GI: Soft. Bowel sounds are normal. There is no tenderness.  Musculoskeletal: Normal range of motion. She exhibits tenderness.  Neurological: She is alert and oriented to person, place, and time.  Skin: Skin is warm and dry.  Psychiatric: She has a normal mood and affect. Her behavior is normal. Judgment and thought content normal.     Assessment/Plan Hx breast Ca Back and hip pain for weeks Imaging reviewed and now scheduled for iliac crest bone biopsy Pt aware of procedure benefits and risks and agreeable to proceed Consent signed and in chart  Tevon Berhane A 10/10/2013, 8:52 AM

## 2013-10-11 ENCOUNTER — Other Ambulatory Visit: Payer: Self-pay | Admitting: *Deleted

## 2013-10-11 DIAGNOSIS — C801 Malignant (primary) neoplasm, unspecified: Secondary | ICD-10-CM

## 2013-10-12 ENCOUNTER — Encounter: Payer: Self-pay | Admitting: *Deleted

## 2013-10-12 ENCOUNTER — Other Ambulatory Visit (HOSPITAL_BASED_OUTPATIENT_CLINIC_OR_DEPARTMENT_OTHER): Payer: Medicare Other

## 2013-10-12 ENCOUNTER — Encounter: Payer: Self-pay | Admitting: Oncology

## 2013-10-12 ENCOUNTER — Ambulatory Visit: Payer: Medicare Other

## 2013-10-12 ENCOUNTER — Ambulatory Visit (HOSPITAL_BASED_OUTPATIENT_CLINIC_OR_DEPARTMENT_OTHER): Payer: Medicare Other

## 2013-10-12 ENCOUNTER — Ambulatory Visit (HOSPITAL_BASED_OUTPATIENT_CLINIC_OR_DEPARTMENT_OTHER): Payer: Medicare Other | Admitting: Oncology

## 2013-10-12 VITALS — BP 157/79 | HR 85 | Temp 99.0°F | Resp 20 | Ht 64.0 in | Wt 142.3 lb

## 2013-10-12 DIAGNOSIS — C44509 Unspecified malignant neoplasm of skin of other part of trunk: Secondary | ICD-10-CM

## 2013-10-12 DIAGNOSIS — C7952 Secondary malignant neoplasm of bone marrow: Secondary | ICD-10-CM

## 2013-10-12 DIAGNOSIS — C50919 Malignant neoplasm of unspecified site of unspecified female breast: Secondary | ICD-10-CM

## 2013-10-12 DIAGNOSIS — Z853 Personal history of malignant neoplasm of breast: Secondary | ICD-10-CM | POA: Diagnosis not present

## 2013-10-12 DIAGNOSIS — C7951 Secondary malignant neoplasm of bone: Secondary | ICD-10-CM | POA: Insufficient documentation

## 2013-10-12 DIAGNOSIS — C44599 Other specified malignant neoplasm of skin of other part of trunk: Secondary | ICD-10-CM | POA: Diagnosis not present

## 2013-10-12 DIAGNOSIS — C801 Malignant (primary) neoplasm, unspecified: Secondary | ICD-10-CM

## 2013-10-12 DIAGNOSIS — G459 Transient cerebral ischemic attack, unspecified: Secondary | ICD-10-CM

## 2013-10-12 DIAGNOSIS — L989 Disorder of the skin and subcutaneous tissue, unspecified: Secondary | ICD-10-CM | POA: Diagnosis not present

## 2013-10-12 LAB — CBC WITH DIFFERENTIAL/PLATELET
BASO%: 0.9 % (ref 0.0–2.0)
Basophils Absolute: 0 10*3/uL (ref 0.0–0.1)
EOS%: 1.1 % (ref 0.0–7.0)
Eosinophils Absolute: 0.1 10*3/uL (ref 0.0–0.5)
HCT: 29.3 % — ABNORMAL LOW (ref 34.8–46.6)
HGB: 9.4 g/dL — ABNORMAL LOW (ref 11.6–15.9)
LYMPH%: 23.7 % (ref 14.0–49.7)
MCH: 26.5 pg (ref 25.1–34.0)
MCHC: 32.1 g/dL (ref 31.5–36.0)
MCV: 82.8 fL (ref 79.5–101.0)
MONO#: 0.5 10*3/uL (ref 0.1–0.9)
MONO%: 10.2 % (ref 0.0–14.0)
NEUT#: 3.2 10*3/uL (ref 1.5–6.5)
NEUT%: 64.1 % (ref 38.4–76.8)
Platelets: 293 10*3/uL (ref 145–400)
RBC: 3.55 10*6/uL — ABNORMAL LOW (ref 3.70–5.45)
RDW: 16.2 % — ABNORMAL HIGH (ref 11.2–14.5)
WBC: 5 10*3/uL (ref 3.9–10.3)
lymph#: 1.2 10*3/uL (ref 0.9–3.3)

## 2013-10-12 LAB — COMPREHENSIVE METABOLIC PANEL (CC13)
ALT: 11 U/L (ref 0–55)
AST: 35 U/L — ABNORMAL HIGH (ref 5–34)
Albumin: 3.2 g/dL — ABNORMAL LOW (ref 3.5–5.0)
Alkaline Phosphatase: 110 U/L (ref 40–150)
Anion Gap: 12 mEq/L — ABNORMAL HIGH (ref 3–11)
BUN: 14 mg/dL (ref 7.0–26.0)
CO2: 27 mEq/L (ref 22–29)
Calcium: 9.9 mg/dL (ref 8.4–10.4)
Chloride: 99 mEq/L (ref 98–109)
Creatinine: 0.9 mg/dL (ref 0.6–1.1)
Glucose: 88 mg/dl (ref 70–140)
Potassium: 4.3 mEq/L (ref 3.5–5.1)
Sodium: 138 mEq/L (ref 136–145)
Total Bilirubin: 0.28 mg/dL (ref 0.20–1.20)
Total Protein: 7.6 g/dL (ref 6.4–8.3)

## 2013-10-12 MED ORDER — ZOLEDRONIC ACID 4 MG/100ML IV SOLN
4.0000 mg | Freq: Once | INTRAVENOUS | Status: AC
  Administered 2013-10-12: 4 mg via INTRAVENOUS
  Filled 2013-10-12: qty 100

## 2013-10-12 NOTE — Progress Notes (Signed)
Checked in new patient with no financial issues prior to seeing the dr. She has appt card and [facility] packet. [place] been [place] of the country.

## 2013-10-12 NOTE — Patient Instructions (Signed)

## 2013-10-12 NOTE — Progress Notes (Signed)
[facility]  [phone]) [phone] Fax:(336) [phone]     ID: Jade Mooney DOB: [birth year]  MR#: [id number]  HBZ#:[id number]  PCP: Ann Held, MD GYN: Newton Pigg MD SU:  OTHER MD: Clarene Essex MD, Leslie Andrea M.D.  CHIEF COMPLAINT: Newly diagnosed metastatic breast cancer  CURRENT TREATMENT: Zolendronate, and awaiting aromatase inhibitor and possibly anti-HER-2 treatment   BREAST CANCER HISTORY: Jade Mooney was referred to Dr. Clarene Essex for evaluation of abdominal discomfort and nausea. Exam and blood work including liver function tests was unremarkable aside from normochromic normocytic anemia. Cholecystitis was suspected and the patient underwent endoscopy followed by abdominal/ pelvic CT scan on 09/29/2013. This showed showed a normal gallbladder. Incidental findings included multiple simple cysts in the liver and aortic atherosclerosis. However mixed lytic and sclerotic bony lesions were noted. On 10/10/2013 the patient underwent biopsy of the right iliac bone, and this showed ([id number]) metastatic invasive ductal carcinoma, grade 2, estrogen and progesterone receptor strongly positive.  Jade Mooney has a remote history of breast cancer, which we tried to reconstruct. She was originally diagnosed early in 1997 with stage III disease, and underwent right mastectomy followed by adjuvant chemotherapy. She then participated in a [facility] protocol with high-dose chemotherapy followed by stem cell rescue 12/22/1995. Unfortunately later that year liver lesions were noted and were biopsy-proven to be metastatic breast cancer. This was not felt to be resectable. However the tumor was estrogen receptor positive and HER-2 positive. The patient was treated with Herceptin (she does not know for what period of time) and was then on tamoxifen until November of 2002. She also participated in a vaccine study at [facility].  Jade Mooney's subsequent history is as detailed below  INTERVAL  HISTORY: Jade Mooney was evaluated in the breast clinic 10/12/2013 accompanied by her husband [name].  REVIEW OF SYSTEMS: She is having pain in her hips and knees, but these are not a new problem. She continues to have some nausea, with rare vomiting. She is sinus symptoms and a little bit of a sore throat. There have been no unusual headaches, visual changes, dizziness, or gait imbalance. She denies cough, phlegm production, or pleurisy. There has been no change in bowel or bladder habits. A detailed review of systems was otherwise noncontributory  PAST MEDICAL HISTORY: Past Medical History  Diagnosis Date  . Cancer     Breast 1997  . TIA (transient ischemic attack)   . Hypothyroidism   . Hypercholesterolemia   . Osteoarthritis   . GERD (gastroesophageal reflux disease)     PAST SURGICAL HISTORY: Past Surgical History  Procedure Laterality Date  . Mastectomy modified radical Right   . Cataract extraction, bilateral    . Total knee arthroplasty Left   . Tonsillectomy    . Tubal ligation      FAMILY HISTORY No family history on file. The patient's father died at the age of 1 from heart disease. The patient's mother died at the age of 23. The patient had no brothers, one sister. One of the patient's mother's 5 sisters was diagnosed with breast cancer in her 21s  GYNECOLOGIC HISTORY:  No LMP recorded. Patient is postmenopausal. Menarche age 6, the patient is GX P0. She stopped having periods approximately 1994. She used hormone replacement until 1997. 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 Her husband [name] (goes by "stomach" was Dir. of information all services at [employer].  They live alone, with no pets.    ADVANCED DIRECTIVES: In place   HEALTH MAINTENANCE: History  Substance Use Topics  . Smoking status: Not on file  . Smokeless tobacco: Not on file  . Alcohol Use:  Not on file     Colonoscopy: 2012  PAP: 2010  Bone density: 2012  Lipid panel:  Allergies  Allergen Reactions  . Amoxicillin Hives  . Percocet [Oxycodone-Acetaminophen] Nausea And Vomiting  . Percodan [Oxycodone-Aspirin] Nausea And Vomiting    Current Outpatient Prescriptions  Medication Sig Dispense Refill  . acetaminophen (TYLENOL) 500 MG tablet Take 1,000 mg by mouth every 6 (six) hours as needed for mild pain.      . Ascorbic Acid (VITAMIN C) 1000 MG tablet Take 1,000 mg by mouth daily.      Jade Mooney Kitchen aspirin EC 325 MG tablet Take 325 mg by mouth daily.      Jade Mooney Kitchen azelastine (ASTELIN) 0.1 % nasal spray Place 1 spray into both nostrils 2 (two) times daily. Use in each nostril as directed      . beta carotene 15 MG capsule Take 15 mg by mouth daily.      . calcium carbonate (OS-CAL) 600 MG TABS tablet Take 600 mg by mouth 2 (two) times daily with a meal.      . Calcium Carbonate-Vitamin D (CALCIUM + D PO) Take 1 tablet by mouth 2 (two) times daily.      . Cholecalciferol 1000 UNITS tablet Take 1,000 Units by mouth daily.      . diclofenac sodium (VOLTAREN) 1 % GEL Apply 2 g topically 4 (four) times daily as needed. Pain      . levothyroxine (SYNTHROID, LEVOTHROID) 88 MCG tablet Take 88 mcg by mouth daily before breakfast.      . Lutein 6 MG TABS Take 6 mg by mouth daily.      Jade Mooney Kitchen METRONIDAZOLE, TOPICAL, 0.75 % LOTN Apply 1 application topically daily.      . Misc Natural Products (GLUCOSAMINE CHONDROITIN MSM PO) Take 2 tablets by mouth 2 (two) times daily.      . Multiple Vitamins-Minerals (MULTIVITAMIN WITH MINERALS) tablet Take 1 tablet by mouth daily.      . pantoprazole (PROTONIX) 40 MG tablet Take 40 mg by mouth 2 (two) times daily.      Vladimir Faster Glycol-Propyl Glycol (SYSTANE OP) Apply 1 drop to eye 3 (three) times daily.      . simvastatin (ZOCOR) 40 MG tablet Take 40 mg by mouth daily.      . vitamin E (VITAMIN E) 400 UNIT capsule Take 400 Units by mouth daily.       No current  facility-administered medications for this visit.    OBJECTIVE: Middle-aged white woman in no acute distress Filed Vitals:   10/12/13 1238  BP: 157/79  Pulse: 85  Temp: 99 F (37.2 C)  Resp: 20     Body mass index is 24.41 kg/(m^2).    ECOG FS:1 - Symptomatic but completely ambulatory  Ocular: Sclerae unicteric, EOMs intact Ear-nose-throat: Oropharynx clear and moist Lymphatic: No cervical or supraclavicular adenopathy Lungs no rales or rhonchi, good excursion bilaterally Heart regular rate and rhythm, no murmur appreciated Abd soft, nontender, positive bowel sounds MSK no focal spinal tenderness, no upper extremity lymphedema Neuro: non-focal, well-oriented, positive affect Breasts: The right breast is surgically absent. There is no evidence of local recurrence. The right axilla is benign. The left breast is unremarkable Skin: The patient has subcutaneous nodules most  easily palpable over the right temporal scalp. These are strongly suggestive of metastases  LAB RESULTS:  CMP     Component Value Date/Time   NA 138 10/12/2013 1225   NA 141 01/17/2011 0946   K 4.3 10/12/2013 1225   K 4.3 01/17/2011 0946   CL 101 01/17/2011 0946   CO2 27 10/12/2013 1225   CO2 31 01/17/2011 0946   GLUCOSE 88 10/12/2013 1225   GLUCOSE 68* 01/17/2011 0946   BUN 14.0 10/12/2013 1225   BUN 19 01/17/2011 0946   CREATININE 0.9 10/12/2013 1225   CREATININE 0.87 01/17/2011 0946   CALCIUM 9.9 10/12/2013 1225   CALCIUM 9.7 01/17/2011 0946   PROT 7.6 10/12/2013 1225   PROT 6.9 01/17/2011 0946   ALBUMIN 3.2* 10/12/2013 1225   ALBUMIN 4.7 01/17/2011 0946   AST 35* 10/12/2013 1225   AST 21 01/17/2011 0946   ALT 11 10/12/2013 1225   ALT 17 01/17/2011 0946   ALKPHOS 110 10/12/2013 1225   ALKPHOS 75 01/17/2011 0946   BILITOT 0.28 10/12/2013 1225   BILITOT 0.3 01/17/2011 0946    I No results found for this basename: SPEP,  UPEP,   kappa and lambda light chains    Lab Results  Component Value Date   WBC  5.0 10/12/2013   NEUTROABS 3.2 10/12/2013   HGB 9.4* 10/12/2013   HCT 29.3* 10/12/2013   MCV 82.8 10/12/2013   PLT 293 10/12/2013      Chemistry      Component Value Date/Time   NA 138 10/12/2013 1225   NA 141 01/17/2011 0946   K 4.3 10/12/2013 1225   K 4.3 01/17/2011 0946   CL 101 01/17/2011 0946   CO2 27 10/12/2013 1225   CO2 31 01/17/2011 0946   BUN 14.0 10/12/2013 1225   BUN 19 01/17/2011 0946   CREATININE 0.9 10/12/2013 1225   CREATININE 0.87 01/17/2011 0946      Component Value Date/Time   CALCIUM 9.9 10/12/2013 1225   CALCIUM 9.7 01/17/2011 0946   ALKPHOS 110 10/12/2013 1225   ALKPHOS 75 01/17/2011 0946   AST 35* 10/12/2013 1225   AST 21 01/17/2011 0946   ALT 11 10/12/2013 1225   ALT 17 01/17/2011 0946   BILITOT 0.28 10/12/2013 1225   BILITOT 0.3 01/17/2011 0946       No results found for this basename: LABCA2    No components found with this basename: [id number]     Recent Labs Lab 10/10/13 0730  INR 1.03    Urinalysis No results found for this basename: colorurine,  appearanceur,  labspec,  phurine,  glucoseu,  hgbur,  bilirubinur,  ketonesur,  proteinur,  urobilinogen,  nitrite,  leukocytesur    STUDIES: Ct Abdomen Pelvis W Contrast  09/29/2013   CLINICAL DATA:  Abdominal pain and anemia.  EXAM: CT ABDOMEN AND PELVIS WITH CONTRAST  TECHNIQUE: Multidetector CT imaging of the abdomen and pelvis was performed using the standard protocol following bolus administration of intravenous contrast.  CONTRAST:  155mL OMNIPAQUE IOHEXOL 300 MG/ML  SOLN  COMPARISON:  Ultrasound 09/19/2013  FINDINGS: The lung bases are clear. There is no pleural or pericardial effusion identified.  There are multiple low attenuation structures within the liver better favored to represent simple cysts. The largest is in the lateral segment of left hepatic lobe measuring 10 mm. The gallbladder appears normal. No biliary dilatation. Normal appearance of the pancreas. The spleen is unremarkable.  The  adrenal glands are both normal. The right kidney  appears normal. The left kidney is also normal. The urinary bladder is within normal limits. The uterus and adnexal structures are negative.  There is no pelvic or stress that mild calcified atherosclerotic change involves the abdominal aorta and its branches. No aneurysm. There is no upper abdominal adenopathy identified. No pelvic or inguinal adenopathy.  The stomach is normal. The small bowel loops have a normal course and caliber. There is no obstruction. The appendix is visualized and appears normal. Multiple distal colonic diverticula noted. No acute inflammation.  Review of the visualized osseous structures shows abnormal heterogeneous appearance of the visualized bony structures favoring mixed lytic and sclerotic metastasis peer. There is a superior endplate fracture deformity involving the T12 vertebra. This is age indeterminate. Marked degenerative disc disease is noted at L5-S1.  IMPRESSION: 1. No acute findings identified within the abdomen or pelvis. 2. Suspect diffuse bone metastases. 3. Superior endplate fracture involves the T12 vertebra. This is age indeterminate.   Electronically Signed   By: Kerby Moors M.D.   On: 09/29/2013 16:55   Ct Biopsy  10/10/2013   CLINICAL DATA:  Prior history of breast cancer, diffuse mixed lytic and sclerotic osseous lesions concerning for metastatic disease  EXAM: CT GUIDED RIGHT ILIAC BONE CORE BIOPSY  Date:  7/20/20157/20/2015 10:08 am  Radiologist:  M. Daryll Brod, MD  Guidance:  CT  FLUOROSCOPY TIME:  NONE.  MEDICATIONS AND MEDICAL HISTORY: 1.5 MG VERSED, 75 MCG FENTANYL  ANESTHESIA/SEDATION: 15 min  CONTRAST:  None.  COMPLICATIONS: None  PROCEDURE: Informed consent was obtained from the patient following explanation of the procedure, risks, benefits and alternatives. The patient understands, agrees and consents for the procedure. All questions were addressed. A time out was performed.  The patient was  positioned prone and noncontrast localization CT was performed of the pelvis to demonstrate the iliac marrow spaces.  Maximal barrier sterile technique utilized including caps, mask, sterile gowns, sterile gloves, large sterile drape, hand hygiene, and betadine prep.  Under sterile conditions and local anesthesia, an 11 gauge coaxial bone biopsy needle was advanced into the right iliac bone. Needle position was confirmed with CT imaging. 11 gauge outer cannula was utilized to obtain a right iliac bone bone biopsy. Two samples were obtained. One placed in formalin and a second placed in saline. Needle was removed. Hemostasis was obtained with compression. The patient tolerated the procedure well. Samples were prepared with the cytotechnologist. No immediate complications.  IMPRESSION: CT guided right iliac bone core biopsy.   Electronically Signed   By: Daryll Brod M.D.   On: 10/10/2013 10:19    ASSESSMENT: 68 y.o. [place] woman with stage IV breast cancer (bone metastatic disease)  (1) s/p Right mastectomy 1997 for an estrogen receptor and HER-2 positive breast cancer, treated with  (a) adjuvant chemotherapy  (b) high-dose chemotherapy followed by stem cell rescue at [facility] 12/21/2005  (c) biopsy-proven metastatic disease November 2007, estrogen receptor and HER-2 positive  (d) trastuzumab (dose? Duration?)  (e) trastuzumab for 5 years completed 2002  (f) participation in a vaccine study at [facility] 2003    (2) status post right iliac crest biopsy 10/10/2013 for invasive ductal carcinoma, grade 2, estrogen and progesterone receptor positive  (3) zolendronate started 10/12/2013, to be repeated every 12 weeks  PLAN: I spent approximately an hour with Quintara today retreating her old history, and orienting her to the new situation. Clearly the cancer that survived her very aggressive therapy is the least aggressive clone, still estrogen receptor positive. Since  it took it 18 years to manifest itself  clearly we are not dealing with a fast growing tumor and I don't think chemotherapy is indicated at this point.  She will definitely benefit from zolendronate and we discussed the new data that shows every 3 months treatment in situations like this is just as effective as monthly. We also discussed aromatase inhibitors. Before starting her on anastrozole, I would like to obtain a PET scan at to make sure there are no other areas of involvement, and also obtain a biopsy from one of his scalp lesions, which may tell us whether the tumor is HER-2 positive or not. The reason to wait on the anastrozole is that I would not want to remove the opportunity of participating in some study if one were available for her.  Accordingly I have requested an appointment with Dr. Donne Hazel for the scalp biopsy and a PET scan. She will receive zolendronate today. She will have a PET scan sometimes next week. She will see me again August 7. There are likely we'll start anastrozole at that time and at this point I am not thinking of adding Palbociclib.  The patient has a good understanding of the overall plan. She agrees with it. She knows the goal of treatment in her case is cure. She will call with any problems that may develop before her next visit here.  Chauncey Cruel, MD   10/13/2013 8:17 PM

## 2013-10-13 ENCOUNTER — Encounter: Payer: Self-pay | Admitting: Oncology

## 2013-10-13 ENCOUNTER — Encounter: Payer: Self-pay | Admitting: *Deleted

## 2013-10-13 ENCOUNTER — Telehealth: Payer: Self-pay | Admitting: Oncology

## 2013-10-13 NOTE — Telephone Encounter (Signed)
s.w. pt and advised on Aug appt....pt ok and aware °

## 2013-10-13 NOTE — Progress Notes (Signed)
10/13/13 Spoke with Jade Mooney today to inform her that she is not eligible for the E2112 clinical trial. The study requires the patient to have progressed after treatment with a non-steriodal AI. Review of Jade Mooney's chart shows she has not yet received treatment with an AI. She completed treatment for her previously diagnosed breast cancer in 2002 and has not required treatment since that time.

## 2013-10-15 NOTE — Progress Notes (Signed)
I contacted Dr. Leamon Arnt and he was able to forward me a note from the bone marrow transplant group at [place] Jade Mooney's history. This is being separately scanned, but in brief:  She was treated on the CALGB 9640. When she was found to have metastatic breast cancer to the liver  she received 4 cycles of Taxotere with a good partial response, then received high-dose cyclophosphamide, cisplatin, and split dose BCNU September of 97. Followup scans November of 97 showed no change in a residual 8 mm solid lesion in the liver. She was started on tamoxifen. Laparoscopic biopsy of a liver nodule 02/25/1996 showed metastatic adenocarcinoma. Because there were multiple lesions these were not resected. She then received 6 months of CEA primed dendritic cells and then HER-2 primed dendritic cells.

## 2013-10-17 ENCOUNTER — Ambulatory Visit (INDEPENDENT_AMBULATORY_CARE_PROVIDER_SITE_OTHER): Payer: Medicare Other | Admitting: General Surgery

## 2013-10-17 ENCOUNTER — Other Ambulatory Visit (INDEPENDENT_AMBULATORY_CARE_PROVIDER_SITE_OTHER): Payer: Self-pay | Admitting: General Surgery

## 2013-10-17 ENCOUNTER — Encounter (INDEPENDENT_AMBULATORY_CARE_PROVIDER_SITE_OTHER): Payer: Self-pay | Admitting: General Surgery

## 2013-10-17 VITALS — BP 142/78 | HR 80 | Temp 98.4°F | Resp 16 | Ht 64.0 in | Wt 141.4 lb

## 2013-10-17 DIAGNOSIS — C7951 Secondary malignant neoplasm of bone: Secondary | ICD-10-CM

## 2013-10-17 DIAGNOSIS — C792 Secondary malignant neoplasm of skin: Secondary | ICD-10-CM | POA: Diagnosis not present

## 2013-10-17 DIAGNOSIS — C50919 Malignant neoplasm of unspecified site of unspecified female breast: Secondary | ICD-10-CM | POA: Diagnosis not present

## 2013-10-17 DIAGNOSIS — C4440 Unspecified malignant neoplasm of skin of scalp and neck: Secondary | ICD-10-CM | POA: Diagnosis not present

## 2013-10-17 DIAGNOSIS — C7952 Secondary malignant neoplasm of bone marrow: Secondary | ICD-10-CM | POA: Diagnosis not present

## 2013-10-17 DIAGNOSIS — Z853 Personal history of malignant neoplasm of breast: Secondary | ICD-10-CM

## 2013-10-17 NOTE — Progress Notes (Signed)
Patient ID: Jade Mooney, female   DOB: Jan 31, 1946, 68 y.o.   MRN: [id number]  Chief Complaint  Patient presents with  . breast cancer    HPI Jade Mooney is a 68 y.o. female.  Referred by Dr Gunnar Bulla Magrinat HPI This is a 68 year old female who initially had breast cancer in 1997. This apparently was stage III disease initially for which she underwent right modified radical mastectomy by Dr. Lennie Hummer followed by adjuvant chemotherapy. Apparently she had metastatic disease at some point after that. She did undergo a stem cell transplant it sounds like also. This tumor previously was estrogen receptor positive and HER-2/neu-positive. She apparently has done very well since then. She recently developed some abdominal pain and underwent evaluation which included a CT scan that showed bony lesions. She underwent a biopsy of the iliac lesion that they showed metastatic invasive ductal carcinoma that is grade 2 and this is estrogen and progesterone strongly positive. She is due to get a PET scan on Friday. She is undergoing evaluation for further treatment right now. She is sent to me today as she has had numerous scalp lesions show up since April. These have not really bothered her. They have increased in number since then. This has been over the same time frame. Dr. Jana Hakim has requested a biopsy of one of these to make sure they are not breast cancer as well as to obtain a HER-2/neu status if they are  Past Medical History  Diagnosis Date  . Cancer     Breast 1997  . TIA (transient ischemic attack)   . Hypothyroidism   . Hypercholesterolemia   . Osteoarthritis   . GERD (gastroesophageal reflux disease)     Past Surgical History  Procedure Laterality Date  . Mastectomy modified radical Right   . Cataract extraction, bilateral    . Total knee arthroplasty Left   . Tonsillectomy    . Tubal ligation      History reviewed. No pertinent family history.  Social History History   Substance Use Topics  . Smoking status: Never Smoker   . Smokeless tobacco: Not on file  . Alcohol Use: Not on file    Allergies  Allergen Reactions  . Amoxicillin Hives  . Percocet [Oxycodone-Acetaminophen] Nausea And Vomiting  . Percodan [Oxycodone-Aspirin] Nausea And Vomiting    Current Outpatient Prescriptions  Medication Sig Dispense Refill  . acetaminophen (TYLENOL) 500 MG tablet Take 1,000 mg by mouth every 6 (six) hours as needed for mild pain.      . Ascorbic Acid (VITAMIN C) 1000 MG tablet Take 1,000 mg by mouth daily.      Marland Kitchen aspirin EC 325 MG tablet Take 325 mg by mouth daily.      Marland Kitchen azelastine (ASTELIN) 0.1 % nasal spray Place 1 spray into both nostrils 2 (two) times daily. Use in each nostril as directed      . beta carotene 15 MG capsule Take 15 mg by mouth daily.      . calcium carbonate (OS-CAL) 600 MG TABS tablet Take 600 mg by mouth 2 (two) times daily with a meal.      . Calcium Carbonate-Vitamin D (CALCIUM + D PO) Take 1 tablet by mouth 2 (two) times daily.      . Cholecalciferol 1000 UNITS tablet Take 1,000 Units by mouth daily.      . diclofenac sodium (VOLTAREN) 1 % GEL Apply 2 g topically 4 (four) times daily as needed. Pain      .  levothyroxine (SYNTHROID, LEVOTHROID) 88 MCG tablet Take 88 mcg by mouth daily before breakfast.      . Lutein 6 MG TABS Take 6 mg by mouth daily.      Marland Kitchen METRONIDAZOLE, TOPICAL, 0.75 % LOTN Apply 1 application topically daily.      . Misc Natural Products (GLUCOSAMINE CHONDROITIN MSM PO) Take 2 tablets by mouth 2 (two) times daily.      . Multiple Vitamins-Minerals (MULTIVITAMIN WITH MINERALS) tablet Take 1 tablet by mouth daily.      . pantoprazole (PROTONIX) 40 MG tablet Take 40 mg by mouth 2 (two) times daily.      Vladimir Faster Glycol-Propyl Glycol (SYSTANE OP) Apply 1 drop to eye 3 (three) times daily.      . simvastatin (ZOCOR) 40 MG tablet Take 40 mg by mouth daily.      . vitamin E (VITAMIN E) 400 UNIT capsule Take 400 Units  by mouth daily.       No current facility-administered medications for this visit.    Review of Systems Review of Systems  Constitutional: Negative for fever, chills and unexpected weight change.  HENT: Negative for congestion, hearing loss, sore throat, trouble swallowing and voice change.   Eyes: Negative for visual disturbance.  Respiratory: Negative for cough and wheezing.   Cardiovascular: Negative for chest pain, palpitations and leg swelling.  Gastrointestinal: Negative for nausea, vomiting, abdominal pain, diarrhea, constipation, blood in stool, abdominal distention and anal bleeding.  Genitourinary: Negative for hematuria, vaginal bleeding and difficulty urinating.  Musculoskeletal: Positive for arthralgias.  Skin: Negative for rash and wound.  Neurological: Negative for seizures, syncope and headaches.  Hematological: Negative for adenopathy. Does not bruise/bleed easily.  Psychiatric/Behavioral: Negative for confusion.    Blood pressure 142/78, pulse 80, temperature 98.4 F (36.9 C), temperature source Oral, resp. rate 16, height $RemoveBe'5\' 4"'DQHiTjAqf$  (1.626 m), weight 141 lb 6.4 oz (64.139 kg).  Physical Exam Physical Exam  Vitals reviewed. Constitutional: She appears well-developed and well-nourished.  Cardiovascular: Normal rate and regular rhythm.   Pulmonary/Chest: Effort normal and breath sounds normal.  Lymphadenopathy:    She has no cervical adenopathy.    She has no axillary adenopathy.       Right: No supraclavicular adenopathy present.       Left: No supraclavicular adenopathy present.  she has multiple cystic lesions on her left scalp and occiput  Data Reviewed DIGITAL SCREENING UNILATERAL LEFT MAMMOGRAM WITH CAD  DIGITAL BREAST TOMOSYNTHESIS  Digital breast tomosynthesis images are acquired in two  projections. These images are reviewed in combination with the  digital mammogram, confirming the findings below.  Comparison: Previous exam(s).  FINDINGS:  ACR Breast  Density Category c: The breast tissue is  heterogeneously dense, which may obscure small masses.  There are no findings suspicious for malignancy.  Images were processed with CAD.  IMPRESSION:  No mammographic evidence of malignancy.  A result letter of this screening mammogram will be mailed directly  to the patient.  RECOMMENDATION:  Screening mammogram in one year. (Code:SM-B-01Y)  BI-RADS CATEGORY 1: Negative.    Assessment    Stage IV breast cancer     Plan    We discussed the biopsy of one of the scalp lesions today. I told her that these may not be breast cancer but we would do this to make sure. If they are that hopefully we can get the HER-2/neu status. I cleansed the area. I then anesthetized the area. A 6 mm punch biopsy of  one of the lesions was done closer to her occiput. Then closed with a 3-0 Vicryl. This was sent for pathology. I will follow-up with the pathology results and she will return next week to have the stitch removed.        Samona Chihuahua 10/17/2013, 8:55 AM

## 2013-10-21 ENCOUNTER — Ambulatory Visit (HOSPITAL_COMMUNITY)
Admission: RE | Admit: 2013-10-21 | Discharge: 2013-10-21 | Disposition: A | Discharge status: Home / Self Care | Payer: Medicare Other | Source: Ambulatory Visit | Attending: Oncology | Admitting: Oncology

## 2013-10-21 DIAGNOSIS — C50919 Malignant neoplasm of unspecified site of unspecified female breast: Secondary | ICD-10-CM | POA: Diagnosis not present

## 2013-10-21 DIAGNOSIS — C7951 Secondary malignant neoplasm of bone: Secondary | ICD-10-CM | POA: Insufficient documentation

## 2013-10-21 DIAGNOSIS — C7952 Secondary malignant neoplasm of bone marrow: Secondary | ICD-10-CM | POA: Diagnosis not present

## 2013-10-21 LAB — GLUCOSE, CAPILLARY: Glucose-Capillary: 107 mg/dL — ABNORMAL HIGH (ref 70–99)

## 2013-10-21 MED ORDER — FLUDEOXYGLUCOSE F - 18 (FDG) INJECTION
9.2000 | Freq: Once | INTRAVENOUS | Status: AC | PRN
  Administered 2013-10-21: 9.2 via INTRAVENOUS

## 2013-10-22 DIAGNOSIS — S0100XA Unspecified open wound of scalp, initial encounter: Secondary | ICD-10-CM | POA: Diagnosis not present

## 2013-10-23 ENCOUNTER — Other Ambulatory Visit: Payer: Self-pay | Admitting: Oncology

## 2013-10-24 ENCOUNTER — Encounter (INDEPENDENT_AMBULATORY_CARE_PROVIDER_SITE_OTHER): Payer: Medicare Other

## 2013-10-28 ENCOUNTER — Ambulatory Visit (HOSPITAL_BASED_OUTPATIENT_CLINIC_OR_DEPARTMENT_OTHER): Payer: Medicare Other | Admitting: Oncology

## 2013-10-28 VITALS — BP 163/68 | HR 68 | Temp 98.3°F | Resp 18 | Ht 64.0 in | Wt 141.9 lb

## 2013-10-28 DIAGNOSIS — Z170 Estrogen receptor positive status [ER+]: Secondary | ICD-10-CM

## 2013-10-28 DIAGNOSIS — C50919 Malignant neoplasm of unspecified site of unspecified female breast: Secondary | ICD-10-CM | POA: Diagnosis not present

## 2013-10-28 DIAGNOSIS — C7951 Secondary malignant neoplasm of bone: Secondary | ICD-10-CM

## 2013-10-28 DIAGNOSIS — C7952 Secondary malignant neoplasm of bone marrow: Secondary | ICD-10-CM

## 2013-10-28 DIAGNOSIS — C792 Secondary malignant neoplasm of skin: Secondary | ICD-10-CM

## 2013-10-28 DIAGNOSIS — Z9010 Acquired absence of unspecified breast and nipple: Secondary | ICD-10-CM

## 2013-10-28 MED ORDER — ANASTROZOLE 1 MG PO TABS: 1.0000 mg | ORAL_TABLET | Freq: Every day | ORAL | Status: DC

## 2013-10-28 NOTE — Progress Notes (Signed)
Jade Mooney  [phone] [phone]     ID: Jade Mooney DOB: Aug 27, 1945  MR#: [id number]  TML#:[id number]  PCP: Ann Held, MD GYN: Newton Pigg MD SU:  OTHER MD: Clarene Essex MD, Leslie Andrea M.D.  CHIEF COMPLAINT: Newly diagnosed metastatic breast cancer  CURRENT TREATMENT: Zolendronate, anastrozole   BREAST CANCER HISTORY: Jade Mooney was referred to Dr. Clarene Essex for evaluation of abdominal discomfort and nausea. Exam and blood work including liver function tests was unremarkable aside from normochromic normocytic anemia. Cholecystitis was suspected and the patient underwent endoscopy followed by abdominal/ pelvic CT scan on 09/29/2013. This showed showed a normal gallbladder. Incidental findings included multiple simple cysts in the liver and aortic atherosclerosis. However mixed lytic and sclerotic bony lesions were noted. On 10/10/2013 the patient underwent biopsy of the right iliac bone, and this showed ([id number]) metastatic invasive ductal carcinoma, grade 2, estrogen and progesterone receptor strongly positive.  Jade Mooney has a remote history of breast cancer, which we tried to reconstruct. She was originally diagnosed early in 1997 with stage III disease, and underwent right mastectomy followed by adjuvant chemotherapy. She then participated in a [facility] protocol with high-dose chemotherapy followed by stem cell rescue 12/22/1995. Unfortunately later that year liver lesions were noted and were biopsy-proven to be metastatic breast cancer. This was not felt to be resectable. However the tumor was estrogen receptor positive and HER-2 positive. The patient was treated with Herceptin (she does not know for what period of time) and was then on tamoxifen until November of 2002. She also participated in a vaccine study at [facility].  Jade Mooney's subsequent history is as detailed below  INTERVAL HISTORY: Jade Mooney returns today for followup of her metastatic breast  cancer accompanied by her husband [name]. Since her last visit here she had biopsy of a scalp nodule which showed (10/17/2013, [id number]) metastatic breast cancer, HER-2 nonamplified, with the signals ratio of 1.09 in the number per cell 2.35. The patient also had a staging PET scan 10/21/2013, which again confirmed widespread bony metastatic disease and possibly a very small nodule anteriorly in the left chest wall, but no evidence of soft tissue metastases in the neck chest abdomen or pelvis.  REVIEW OF SYSTEMS: Jade Mooney tolerated her first zolendronate dose well, although she developed more pain in the left hip and left rib cage. The PET scan did show multiple rib fractures, although mostly on the right side. She also has significant left knee pain. She complains of dizziness, which is a new symptom, not accompanied by sinus problems, visual changes, nausea, or vomiting. Aside from this a detailed review of systems today was stable  PAST MEDICAL HISTORY: Past Medical History  Diagnosis Date  . Cancer     Breast 1997  . TIA (transient ischemic attack)   . Hypothyroidism   . Hypercholesterolemia   . Osteoarthritis   . GERD (gastroesophageal reflux disease)     PAST SURGICAL HISTORY: Past Surgical History  Procedure Laterality Date  . Mastectomy modified radical Right   . Cataract extraction, bilateral    . Total knee arthroplasty Left   . Tonsillectomy    . Tubal ligation      FAMILY HISTORY No family history on file. The patient's father died at the age of 59 from heart disease. The patient's mother died at the age of 64. The patient had no brothers, one sister. One of the patient's mother's 5 sisters was diagnosed with breast cancer in her 76s  GYNECOLOGIC HISTORY:  No LMP recorded. Patient is postmenopausal. Menarche age 85, the patient is GX P0. She stopped having periods approximately 1994. She used hormone replacement until 1997. 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 Her husband [name] (goes by "stomach" was Dir. of information all services at [employer]. They live alone, with no pets.    ADVANCED DIRECTIVES: In place   HEALTH MAINTENANCE: History  Substance Use Topics  . Smoking status: Never Smoker   . Smokeless tobacco: Not on file  . Alcohol Use: Not on file     Colonoscopy: 2012  PAP: 2010  Bone density: 2012  Lipid panel:  Allergies  Allergen Reactions  . Amoxicillin Hives  . Percocet [Oxycodone-Acetaminophen] Nausea And Vomiting  . Percodan [Oxycodone-Aspirin] Nausea And Vomiting    Current Outpatient Prescriptions  Medication Sig Dispense Refill  . acetaminophen (TYLENOL) 500 MG tablet Take 1,000 mg by mouth every 6 (six) hours as needed for mild pain.      . Ascorbic Acid (VITAMIN C) 1000 MG tablet Take 1,000 mg by mouth daily.      Marland Kitchen aspirin EC 325 MG tablet Take 325 mg by mouth daily.      Marland Kitchen azelastine (ASTELIN) 0.1 % nasal spray Place 1 spray into both nostrils 2 (two) times daily. Use in each nostril as directed      . beta carotene 15 MG capsule Take 15 mg by mouth daily.      . calcium carbonate (OS-CAL) 600 MG TABS tablet Take 600 mg by mouth 2 (two) times daily with a meal.      . Calcium Carbonate-Vitamin D (CALCIUM + D PO) Take 1 tablet by mouth 2 (two) times daily.      . Cholecalciferol 1000 UNITS tablet Take 1,000 Units by mouth daily.      . diclofenac sodium (VOLTAREN) 1 % GEL Apply 2 g topically 4 (four) times daily as needed. Pain      . levothyroxine (SYNTHROID, LEVOTHROID) 88 MCG tablet Take 88 mcg by mouth daily before breakfast.      . Lutein 6 MG TABS Take 6 mg by mouth daily.      Marland Kitchen METRONIDAZOLE, TOPICAL, 0.75 % LOTN Apply 1 application topically daily.      . Misc Natural Products (GLUCOSAMINE CHONDROITIN MSM PO) Take 2 tablets by mouth 2 (two) times daily.      . Multiple Vitamins-Minerals  (MULTIVITAMIN WITH MINERALS) tablet Take 1 tablet by mouth daily.      . pantoprazole (PROTONIX) 40 MG tablet Take 40 mg by mouth 2 (two) times daily.      Vladimir Faster Glycol-Propyl Glycol (SYSTANE OP) Apply 1 drop to eye 3 (three) times daily.      . simvastatin (ZOCOR) 40 MG tablet Take 40 mg by mouth daily.      . vitamin E (VITAMIN E) 400 UNIT capsule Take 400 Units by mouth daily.       No current facility-administered medications for this visit.    OBJECTIVE: Middle-aged white woman who appears stated age 29 Vitals:   10/28/13 1620  BP: 163/68  Pulse: 68  Temp: 98.3 F (36.8 C)  Resp: 18     Body mass index is 24.34 kg/(m^2).    ECOG FS:1 - Symptomatic but completely ambulatory  Ocular: Sclerae unicteric, pupils round and reactive Ear-nose-throat: Oropharynx clear, teeth in good repair Lymphatic: No  cervical or supraclavicular adenopathy Lungs no rales or rhonchi, good excursion bilaterally Heart regular rate and rhythm, no murmur appreciated Abd soft, nontender, positive bowel sounds, no masses palpated MSK no focal spinal tenderness, no upper extremity lymphedema Neuro: non-focal, well-oriented, positive affect Breasts: The right breast is surgically absent. There is no evidence of local recurrence. The right axilla is benign. The left breast is unremarkable. I do not find any suspicious mass in the left chest wall by palpation or inspection Skin: The patient has subcutaneous nodules most easily palpable over the right temporal scalp.  LAB RESULTS:  CMP     Component Value Date/Time   NA 138 10/12/2013 1225   NA 141 01/17/2011 0946   K 4.3 10/12/2013 1225   K 4.3 01/17/2011 0946   CL 101 01/17/2011 0946   CO2 27 10/12/2013 1225   CO2 31 01/17/2011 0946   GLUCOSE 88 10/12/2013 1225   GLUCOSE 68* 01/17/2011 0946   BUN 14.0 10/12/2013 1225   BUN 19 01/17/2011 0946   CREATININE 0.9 10/12/2013 1225   CREATININE 0.87 01/17/2011 0946   CALCIUM 9.9 10/12/2013 1225    CALCIUM 9.7 01/17/2011 0946   PROT 7.6 10/12/2013 1225   PROT 6.9 01/17/2011 0946   ALBUMIN 3.2* 10/12/2013 1225   ALBUMIN 4.7 01/17/2011 0946   AST 35* 10/12/2013 1225   AST 21 01/17/2011 0946   ALT 11 10/12/2013 1225   ALT 17 01/17/2011 0946   ALKPHOS 110 10/12/2013 1225   ALKPHOS 75 01/17/2011 0946   BILITOT 0.28 10/12/2013 1225   BILITOT 0.3 01/17/2011 0946    I No results found for this basename: SPEP,  UPEP,   kappa and lambda light chains    Lab Results  Component Value Date   WBC 5.0 10/12/2013   NEUTROABS 3.2 10/12/2013   HGB 9.4* 10/12/2013   HCT 29.3* 10/12/2013   MCV 82.8 10/12/2013   PLT 293 10/12/2013      Chemistry      Component Value Date/Time   NA 138 10/12/2013 1225   NA 141 01/17/2011 0946   K 4.3 10/12/2013 1225   K 4.3 01/17/2011 0946   CL 101 01/17/2011 0946   CO2 27 10/12/2013 1225   CO2 31 01/17/2011 0946   BUN 14.0 10/12/2013 1225   BUN 19 01/17/2011 0946   CREATININE 0.9 10/12/2013 1225   CREATININE 0.87 01/17/2011 0946      Component Value Date/Time   CALCIUM 9.9 10/12/2013 1225   CALCIUM 9.7 01/17/2011 0946   ALKPHOS 110 10/12/2013 1225   ALKPHOS 75 01/17/2011 0946   AST 35* 10/12/2013 1225   AST 21 01/17/2011 0946   ALT 11 10/12/2013 1225   ALT 17 01/17/2011 0946   BILITOT 0.28 10/12/2013 1225   BILITOT 0.3 01/17/2011 0946       No results found for this basename: LABCA2    No components found with this basename: LABCA125    No results found for this basename: INR,  in the last 168 hours  Urinalysis No results found for this basename: colorurine,  appearanceur,  labspec,  phurine,  glucoseu,  hgbur,  bilirubinur,  ketonesur,  proteinur,  urobilinogen,  nitrite,  leukocytesur    STUDIES: Ct Abdomen Pelvis W Contrast  09/29/2013   CLINICAL DATA:  Abdominal pain and anemia.  EXAM: CT ABDOMEN AND PELVIS WITH CONTRAST  TECHNIQUE: Multidetector CT imaging of the abdomen and pelvis was performed using the standard protocol following bolus  administration of intravenous contrast.  CONTRAST:  157m OMNIPAQUE IOHEXOL 300 MG/ML  SOLN  COMPARISON:  Ultrasound 09/19/2013  FINDINGS: The lung bases are clear. There is no pleural or pericardial effusion identified.  There are multiple low attenuation structures within the liver better favored to represent simple cysts. The largest is in the lateral segment of left hepatic lobe measuring 10 mm. The gallbladder appears normal. No biliary dilatation. Normal appearance of the pancreas. The spleen is unremarkable.  The adrenal glands are both normal. The right kidney appears normal. The left kidney is also normal. The urinary bladder is within normal limits. The uterus and adnexal structures are negative.  There is no pelvic or stress that mild calcified atherosclerotic change involves the abdominal aorta and its branches. No aneurysm. There is no upper abdominal adenopathy identified. No pelvic or inguinal adenopathy.  The stomach is normal. The small bowel loops have a normal course and caliber. There is no obstruction. The appendix is visualized and appears normal. Multiple distal colonic diverticula noted. No acute inflammation.  Review of the visualized osseous structures shows abnormal heterogeneous appearance of the visualized bony structures favoring mixed lytic and sclerotic metastasis peer. There is a superior endplate fracture deformity involving the T12 vertebra. This is age indeterminate. Marked degenerative disc disease is noted at L5-S1.  IMPRESSION: 1. No acute findings identified within the abdomen or pelvis. 2. Suspect diffuse bone metastases. 3. Superior endplate fracture involves the T12 vertebra. This is age indeterminate.   Electronically Signed   By: TKerby MoorsM.D.   On: 09/29/2013 16:55   Nm Pet Image Restag (ps) Skull Base To Thigh  10/21/2013   CLINICAL DATA:  Subsequent treatment strategy for breast cancer.  EXAM: NUCLEAR MEDICINE PET SKULL BASE TO THIGH  TECHNIQUE: 9.2 mCi F-18  FDG was injected intravenously. Full-ring PET imaging was performed from the skull base to thigh after the radiotracer. CT data was obtained and used for attenuation correction and anatomic localization.  FASTING BLOOD GLUCOSE:  Value: 107 mg/dl  COMPARISON:  Abdominal pelvic CT 09/29/2013  FINDINGS: NECK  No hypermetabolic cervical lymph nodes are identified.There are no lesions of the pharyngeal mucosal space.  CHEST  There are no hypermetabolic mediastinal, hilar or axillary lymph nodes. Patient is status post right mastectomy. There is a 6 mm subcutaneous nodule anteriorly in the left chest on image 60 which is hypermetabolic with an SUV max of 1.7. No other abnormal chest wall activity is seen. There is no abnormal pulmonary activity. Mild scarring is present at both lung apices.  ABDOMEN/PELVIS  There is no hypermetabolic activity within the liver, adrenal glands, spleen or pancreas. There is no hypermetabolic nodal activity. Hepatic cysts and sigmoid diverticulosis are stable.  SKELETON  There is widespread heterogeneously increased metabolic activity throughout the bones, corresponding with widespread metastatic disease on prior CT. There are probable associated pathologic fractures within several ribs, especially the right seventh rib. This demonstrates an SUV max of 4.7. No definite pathologic fractures are identified within the spine. The fracture involving the superior endplate of TZ61on CT does not show focally increased metabolic activity.  IMPRESSION: 1. Widespread osseous metastatic disease. 2. Nonspecific tiny subcutaneous nodule anteriorly in the left chest wall. This is mildly hypermetabolic and potentially a soft tissue metastasis. 3. No other evidence of extraosseous metastases within the neck, chest, abdomen or pelvis.   Electronically Signed   By: BCamie PatienceM.D.   On: 10/21/2013 11:05   Ct Biopsy  10/10/2013   CLINICAL DATA:  Prior  history of breast cancer, diffuse mixed lytic and  sclerotic osseous lesions concerning for metastatic disease  EXAM: CT GUIDED RIGHT ILIAC BONE CORE BIOPSY  Date:  7/20/20157/20/2015 10:08 am  Radiologist:  M. Daryll Brod, MD  Guidance:  CT  FLUOROSCOPY TIME:  NONE.  MEDICATIONS AND MEDICAL HISTORY: 1.5 MG VERSED, 75 MCG FENTANYL  ANESTHESIA/SEDATION: 15 min  CONTRAST:  None.  COMPLICATIONS: None  PROCEDURE: Informed consent was obtained from the patient following explanation of the procedure, risks, benefits and alternatives. The patient understands, agrees and consents for the procedure. All questions were addressed. A time out was performed.  The patient was positioned prone and noncontrast localization CT was performed of the pelvis to demonstrate the iliac marrow spaces.  Maximal barrier sterile technique utilized including caps, mask, sterile gowns, sterile gloves, large sterile drape, hand hygiene, and betadine prep.  Under sterile conditions and local anesthesia, an 11 gauge coaxial bone biopsy needle was advanced into the right iliac bone. Needle position was confirmed with CT imaging. 11 gauge outer cannula was utilized to obtain a right iliac bone bone biopsy. Two samples were obtained. One placed in formalin and a second placed in saline. Needle was removed. Hemostasis was obtained with compression. The patient tolerated the procedure well. Samples were prepared with the cytotechnologist. No immediate complications.  IMPRESSION: CT guided right iliac bone core biopsy.   Electronically Signed   By: Daryll Brod M.D.   On: 10/10/2013 10:19   ASSESSMENT: 68 y.o. [place] woman with stage IV breast cancer (bone metastatic disease)  (1) s/p Right mastectomy 1997 for an estrogen receptor and HER-2 positive breast cancer, treated with  (a) adjuvant chemotherapy according to CALGB 9640 (taxotere x 4 cycles)  (b) high-dose chemotherapy (cyclophosphamide, carboplatin, BCNU) followed by stem cell rescue at [place] 12/22/1995  (c) biopsy-proven metastatic  disease November 2007, estrogen receptor and HER-2 positive  (d) trastuzumab (dose? Duration?)  (e) tamoxifen for 5 years completed 2002  (f) participation in a vaccine study at [facility] 2003 (CEA primed dendritic cells, HER-2 primed dendritic cells)    (2) status post right iliac crest biopsy 10/10/2013 for invasive ductal carcinoma, grade 2, estrogen and progesterone receptor positive  (3) zolendronate started 10/12/2013, to be repeated every 12 weeks  (4) biopsy of a scalp lesion 10/17/2013 showed metastatic breast cancer, HER-2/neu negative  (5) anastrozole started 10/28/2013  PLAN: We spent approximately 40 minutes going over her new study results. The scalp lesion confirms that she has subcutaneously metastatic breast cancer, and that it is HER-2 negative. That clarifies the treatment options. The PET scan shows primarily bony disease. There is no evidence of lung, liver, or nodal involvement. Accordingly we are ready to start treatment. This will consist of anastrozole and we again discussed the possible toxicities, side effects and complications of this agent. She will call with any problems that may develop over the next visit here which will be in approximately 2 months.  Bone only disease is notoriously difficult to follow. We will obtain a baseline bone density in approximately 3 months. We will start following the CA 27-29 at the next visit. The patient understands the limitations of this "marker". Finally we can follow the scalp lesions.  Given the history of dizziness as a new symptom we're obtaining a brain MRI to complete her staging studies.  For the pain Alizabeth is experiencing, I suggested in addition to Tylenol she tried Aleve 2 tablets 3 times a day with food. She is already on Protonix.  If this does not relieve the problem sufficiently we can add tramadol or consider palliative radiation.  The patient has a good understanding of the overall plan. She agrees with it. She knows  the goal of treatment in her case is  control. She will call with any problems that may develop before her next visit here.  Chauncey Cruel, MD   10/28/2013 4:32 PM

## 2013-10-31 ENCOUNTER — Other Ambulatory Visit: Payer: Self-pay | Admitting: *Deleted

## 2013-10-31 DIAGNOSIS — C50919 Malignant neoplasm of unspecified site of unspecified female breast: Secondary | ICD-10-CM

## 2013-10-31 DIAGNOSIS — C7951 Secondary malignant neoplasm of bone: Principal | ICD-10-CM

## 2013-10-31 MED ORDER — ANASTROZOLE 1 MG PO TABS: 1.0000 mg | ORAL_TABLET | Freq: Every day | ORAL | Status: DC

## 2013-10-31 NOTE — Addendum Note (Signed)
Addended by: Prentiss Bells on: 10/31/2013 09:37 AM   Modules accepted: Medications

## 2013-11-01 ENCOUNTER — Telehealth: Payer: Self-pay | Admitting: Oncology

## 2013-11-01 ENCOUNTER — Other Ambulatory Visit: Payer: Self-pay

## 2013-11-01 DIAGNOSIS — C50919 Malignant neoplasm of unspecified site of unspecified female breast: Secondary | ICD-10-CM

## 2013-11-01 DIAGNOSIS — C7951 Secondary malignant neoplasm of bone: Principal | ICD-10-CM

## 2013-11-01 MED ORDER — LORAZEPAM 0.5 MG PO TABS: 0.5000 mg | ORAL_TABLET | Freq: Three times a day (TID) | ORAL | Status: DC

## 2013-11-01 NOTE — Telephone Encounter (Signed)
, °

## 2013-11-01 NOTE — Progress Notes (Signed)
Ativan ordered for claustrophobia for MRI.  Phoned in to [facility] pharmacist - Almyra Free.

## 2013-11-07 ENCOUNTER — Ambulatory Visit (HOSPITAL_COMMUNITY)
Admission: RE | Admit: 2013-11-07 | Discharge: 2013-11-07 | Disposition: A | Discharge status: Home / Self Care | Payer: Medicare Other | Source: Ambulatory Visit | Attending: Oncology | Admitting: Oncology

## 2013-11-07 DIAGNOSIS — C50919 Malignant neoplasm of unspecified site of unspecified female breast: Secondary | ICD-10-CM | POA: Diagnosis not present

## 2013-11-07 DIAGNOSIS — Z853 Personal history of malignant neoplasm of breast: Secondary | ICD-10-CM | POA: Insufficient documentation

## 2013-11-07 DIAGNOSIS — C7952 Secondary malignant neoplasm of bone marrow: Secondary | ICD-10-CM

## 2013-11-07 DIAGNOSIS — C7951 Secondary malignant neoplasm of bone: Secondary | ICD-10-CM | POA: Diagnosis not present

## 2013-11-07 MED ORDER — GADOBENATE DIMEGLUMINE 529 MG/ML IV SOLN
13.0000 mL | Freq: Once | INTRAVENOUS | Status: AC | PRN
  Administered 2013-11-07: 13 mL via INTRAVENOUS

## 2013-11-10 ENCOUNTER — Other Ambulatory Visit: Payer: Self-pay | Admitting: *Deleted

## 2013-11-10 ENCOUNTER — Telehealth: Payer: Self-pay | Admitting: *Deleted

## 2013-11-10 DIAGNOSIS — C50919 Malignant neoplasm of unspecified site of unspecified female breast: Secondary | ICD-10-CM

## 2013-11-10 DIAGNOSIS — C7951 Secondary malignant neoplasm of bone: Principal | ICD-10-CM

## 2013-11-10 MED ORDER — TRAMADOL HCL 50 MG PO TABS: 50.0000 mg | ORAL_TABLET | Freq: Four times a day (QID) | ORAL | Status: DC | PRN

## 2013-11-10 NOTE — Telephone Encounter (Signed)
Pt left message stating she has ongoing pain in her hip and knee which was discussed at last office visit. Per MD recommendation Myan has used the tylenol/ibuprophen and aleve with little benefit.  " he said for me to call if above did not help and he would initiate tramadol and or gabapentin."  Pt left return call number as [phone].

## 2013-11-11 ENCOUNTER — Ambulatory Visit: Payer: Medicare Other | Admitting: Oncology

## 2013-11-11 ENCOUNTER — Other Ambulatory Visit: Payer: Medicare Other

## 2013-11-11 ENCOUNTER — Ambulatory Visit: Payer: Medicare Other

## 2013-11-12 ENCOUNTER — Other Ambulatory Visit: Payer: Self-pay | Admitting: Oncology

## 2013-11-12 NOTE — Progress Notes (Unsigned)
Jade Mooney is bring MRI shows no obvious metastatic disease. There is some dural thickening which is likely reactive t but will have to be watched. I left a message from [place] with the good news

## 2013-11-14 ENCOUNTER — Other Ambulatory Visit: Payer: Self-pay | Admitting: *Deleted

## 2013-11-14 ENCOUNTER — Telehealth: Payer: Self-pay | Admitting: *Deleted

## 2013-11-14 MED ORDER — GABAPENTIN 300 MG PO CAPS: 300.0000 mg | ORAL_CAPSULE | Freq: Four times a day (QID) | ORAL | Status: DC | PRN

## 2013-11-14 NOTE — Telephone Encounter (Signed)
This RN spoke with pt per her VM stating little benefit from use of tramadol started on 8/21. Per VM pt stated at visit MD suggested possible use of gabapentin.  Per call this RN informed pt of MD recommendation to hold the tramadol - and to start gabapentin. Pt is to start with a 300 mg tablet bid and post 48 hours may increase to qid if needed.  Above discussed with pt including answering her questions as to noted pain increase post receiving zometa.  Pt will also institute use of tums at present for calcium replacement.  Bill understands goal is for best control with greatest activity and least amount of side effects.  No other needs at this time.

## 2013-11-17 ENCOUNTER — Other Ambulatory Visit: Payer: Self-pay | Admitting: *Deleted

## 2013-11-17 DIAGNOSIS — C50919 Malignant neoplasm of unspecified site of unspecified female breast: Secondary | ICD-10-CM

## 2013-11-17 DIAGNOSIS — C7951 Secondary malignant neoplasm of bone: Principal | ICD-10-CM

## 2013-11-17 MED ORDER — NAPROXEN 500 MG PO TABS: 500.0000 mg | ORAL_TABLET | Freq: Two times a day (BID) | ORAL | Status: DC

## 2013-11-17 NOTE — Telephone Encounter (Signed)
Received call from patient stating, "I've had no problems with the Neurontin. Is it OK with Dr. Doris Cheadle to increase it to four times a day? Also, the Tramadol isn't helping with the pain." Per Dr. Jana Hakim, stop Tramadol and start Naprosyn 500 mg. Go ahead and increase Neurontin to four times/day. Patient verbalized understanding.

## 2013-11-22 ENCOUNTER — Other Ambulatory Visit: Payer: Self-pay

## 2013-11-22 ENCOUNTER — Telehealth: Payer: Self-pay | Admitting: *Deleted

## 2013-11-22 DIAGNOSIS — Z1231 Encounter for screening mammogram for malignant neoplasm of breast: Secondary | ICD-10-CM

## 2013-11-22 DIAGNOSIS — Z9011 Acquired absence of right breast and nipple: Secondary | ICD-10-CM

## 2013-11-22 NOTE — Telephone Encounter (Signed)
This RN spoke with pt per her call regarding pain control better on gabapentin but still interfering with ADL's.  Per discussion Jade Mooney has been using naprosyn with the gabapentin.  She will stop the gabapentin and initiate the tramadol.

## 2013-11-23 ENCOUNTER — Telehealth: Payer: Self-pay

## 2013-11-23 NOTE — Telephone Encounter (Signed)
Noticed last night left ankle slightly swollen.  Still swollen this morning.  Not painful.  Denies redness, difference in color or temperature between left and right ankle, pain, no swelling in foot or leg.   In agreement with patient to observe at this time.  Any worsening of symptoms patient should call clinic.  Recommended patient elevate ankle and use ice.  Pt voiced understanding.

## 2013-11-23 NOTE — Telephone Encounter (Signed)
Returned pt call.  LMOVM - pt to return call to clinic. 

## 2013-11-29 ENCOUNTER — Telehealth: Payer: Self-pay | Admitting: *Deleted

## 2013-11-29 NOTE — Telephone Encounter (Signed)
Received call from patient stating, "I've been taking Neurontin 300 mg four times a day and Tramadol 50 mg twice a day and still no relief with hip pain. Is there something else Dr. Jana Hakim can give me?" Return number is [phone].

## 2013-11-30 ENCOUNTER — Other Ambulatory Visit: Payer: Self-pay | Admitting: *Deleted

## 2013-11-30 DIAGNOSIS — C50919 Malignant neoplasm of unspecified site of unspecified female breast: Secondary | ICD-10-CM

## 2013-11-30 DIAGNOSIS — C7951 Secondary malignant neoplasm of bone: Principal | ICD-10-CM

## 2013-11-30 MED ORDER — HYDROMORPHONE HCL 4 MG PO TABS: 4.0000 mg | ORAL_TABLET | ORAL | Status: DC | PRN

## 2013-11-30 NOTE — Telephone Encounter (Signed)
Per Dr. Jana Hakim, let's start Dilaudid 4 mg tablets for hip pain. I spoke with patient regarding the prescription and that someone would need to come and pick it up. All questions answered. Patient verbalized understanding.

## 2013-12-08 DIAGNOSIS — H353190 Nonexudative age-related macular degeneration, unspecified eye, stage unspecified: Secondary | ICD-10-CM | POA: Diagnosis not present

## 2013-12-23 ENCOUNTER — Other Ambulatory Visit: Payer: Self-pay | Admitting: *Deleted

## 2013-12-23 DIAGNOSIS — C50919 Malignant neoplasm of unspecified site of unspecified female breast: Secondary | ICD-10-CM

## 2013-12-23 DIAGNOSIS — C7951 Secondary malignant neoplasm of bone: Principal | ICD-10-CM

## 2013-12-23 MED ORDER — HYDROMORPHONE HCL 4 MG PO TABS: 4.0000 mg | ORAL_TABLET | ORAL | Status: DC | PRN

## 2013-12-27 DIAGNOSIS — Z79899 Other long term (current) drug therapy: Secondary | ICD-10-CM | POA: Diagnosis not present

## 2013-12-27 DIAGNOSIS — E039 Hypothyroidism, unspecified: Secondary | ICD-10-CM | POA: Diagnosis not present

## 2013-12-27 DIAGNOSIS — Z23 Encounter for immunization: Secondary | ICD-10-CM | POA: Diagnosis not present

## 2013-12-27 DIAGNOSIS — E782 Mixed hyperlipidemia: Secondary | ICD-10-CM | POA: Diagnosis not present

## 2013-12-30 DIAGNOSIS — D510 Vitamin B12 deficiency anemia due to intrinsic factor deficiency: Secondary | ICD-10-CM | POA: Diagnosis not present

## 2013-12-30 DIAGNOSIS — C50919 Malignant neoplasm of unspecified site of unspecified female breast: Secondary | ICD-10-CM | POA: Diagnosis not present

## 2013-12-30 DIAGNOSIS — C799 Secondary malignant neoplasm of unspecified site: Secondary | ICD-10-CM | POA: Diagnosis not present

## 2013-12-30 DIAGNOSIS — D630 Anemia in neoplastic disease: Secondary | ICD-10-CM | POA: Diagnosis not present

## 2014-01-06 DIAGNOSIS — D510 Vitamin B12 deficiency anemia due to intrinsic factor deficiency: Secondary | ICD-10-CM | POA: Diagnosis not present

## 2014-01-11 ENCOUNTER — Ambulatory Visit (HOSPITAL_COMMUNITY)
Admission: RE | Admit: 2014-01-11 | Discharge: 2014-01-11 | Disposition: A | Discharge status: Home / Self Care | Payer: Medicare Other | Source: Ambulatory Visit | Attending: Oncology | Admitting: Oncology

## 2014-01-11 ENCOUNTER — Telehealth: Payer: Self-pay | Admitting: Oncology

## 2014-01-11 ENCOUNTER — Ambulatory Visit (HOSPITAL_BASED_OUTPATIENT_CLINIC_OR_DEPARTMENT_OTHER): Payer: Medicare Other | Admitting: Oncology

## 2014-01-11 ENCOUNTER — Ambulatory Visit
Admission: RE | Admit: 2014-01-11 | Discharge: 2014-01-11 | Disposition: A | Discharge status: Home / Self Care | Payer: Medicare Other | Source: Ambulatory Visit

## 2014-01-11 ENCOUNTER — Other Ambulatory Visit (HOSPITAL_BASED_OUTPATIENT_CLINIC_OR_DEPARTMENT_OTHER): Payer: Medicare Other

## 2014-01-11 ENCOUNTER — Telehealth: Payer: Self-pay | Admitting: *Deleted

## 2014-01-11 VITALS — BP 137/72 | HR 67 | Temp 98.5°F | Resp 18 | Ht 64.0 in | Wt 143.6 lb

## 2014-01-11 DIAGNOSIS — M25552 Pain in left hip: Secondary | ICD-10-CM | POA: Insufficient documentation

## 2014-01-11 DIAGNOSIS — Z9011 Acquired absence of right breast and nipple: Secondary | ICD-10-CM

## 2014-01-11 DIAGNOSIS — G458 Other transient cerebral ischemic attacks and related syndromes: Secondary | ICD-10-CM

## 2014-01-11 DIAGNOSIS — Z853 Personal history of malignant neoplasm of breast: Secondary | ICD-10-CM | POA: Diagnosis not present

## 2014-01-11 DIAGNOSIS — C50919 Malignant neoplasm of unspecified site of unspecified female breast: Secondary | ICD-10-CM

## 2014-01-11 DIAGNOSIS — M899 Disorder of bone, unspecified: Secondary | ICD-10-CM

## 2014-01-11 DIAGNOSIS — C7951 Secondary malignant neoplasm of bone: Secondary | ICD-10-CM | POA: Insufficient documentation

## 2014-01-11 DIAGNOSIS — Z1231 Encounter for screening mammogram for malignant neoplasm of breast: Secondary | ICD-10-CM

## 2014-01-11 DIAGNOSIS — C50912 Malignant neoplasm of unspecified site of left female breast: Secondary | ICD-10-CM | POA: Diagnosis not present

## 2014-01-11 DIAGNOSIS — G893 Neoplasm related pain (acute) (chronic): Secondary | ICD-10-CM | POA: Diagnosis not present

## 2014-01-11 DIAGNOSIS — C792 Secondary malignant neoplasm of skin: Secondary | ICD-10-CM

## 2014-01-11 LAB — CBC WITH DIFFERENTIAL/PLATELET
BASO%: 1 % (ref 0.0–2.0)
Basophils Absolute: 0 10*3/uL (ref 0.0–0.1)
EOS%: 2.4 % (ref 0.0–7.0)
Eosinophils Absolute: 0.1 10*3/uL (ref 0.0–0.5)
HCT: 30.4 % — ABNORMAL LOW (ref 34.8–46.6)
HGB: 9.3 g/dL — ABNORMAL LOW (ref 11.6–15.9)
LYMPH%: 26.4 % (ref 14.0–49.7)
MCH: 25.9 pg (ref 25.1–34.0)
MCHC: 30.7 g/dL — ABNORMAL LOW (ref 31.5–36.0)
MCV: 84.1 fL (ref 79.5–101.0)
MONO#: 0.4 10*3/uL (ref 0.1–0.9)
MONO%: 11.2 % (ref 0.0–14.0)
NEUT#: 2.2 10*3/uL (ref 1.5–6.5)
NEUT%: 59 % (ref 38.4–76.8)
Platelets: 325 10*3/uL (ref 145–400)
RBC: 3.61 10*6/uL — ABNORMAL LOW (ref 3.70–5.45)
RDW: 19 % — ABNORMAL HIGH (ref 11.2–14.5)
WBC: 3.7 10*3/uL — ABNORMAL LOW (ref 3.9–10.3)
lymph#: 1 10*3/uL (ref 0.9–3.3)

## 2014-01-11 LAB — CANCER ANTIGEN 27.29: CA 27.29: 993 U/mL — ABNORMAL HIGH (ref 0–39)

## 2014-01-11 LAB — COMPREHENSIVE METABOLIC PANEL (CC13)
ALT: 14 U/L (ref 0–55)
AST: 21 U/L (ref 5–34)
Albumin: 3.7 g/dL (ref 3.5–5.0)
Alkaline Phosphatase: 166 U/L — ABNORMAL HIGH (ref 40–150)
Anion Gap: 8 mEq/L (ref 3–11)
BUN: 6.1 mg/dL — ABNORMAL LOW (ref 7.0–26.0)
CO2: 28 mEq/L (ref 22–29)
Calcium: 9.6 mg/dL (ref 8.4–10.4)
Chloride: 106 mEq/L (ref 98–109)
Creatinine: 0.7 mg/dL (ref 0.6–1.1)
Glucose: 104 mg/dl (ref 70–140)
Potassium: 4.7 mEq/L (ref 3.5–5.1)
Sodium: 142 mEq/L (ref 136–145)
Total Bilirubin: 0.24 mg/dL (ref 0.20–1.20)
Total Protein: 6.7 g/dL (ref 6.4–8.3)

## 2014-01-11 MED ORDER — HYDROMORPHONE HCL 4 MG PO TABS: 4.0000 mg | ORAL_TABLET | Freq: Four times a day (QID) | ORAL | Status: DC | PRN

## 2014-01-11 NOTE — Telephone Encounter (Signed)
per vm left pt wanted appt for 11/5-adv no order for 11/5 appt-adv to mention to GM on next appt-pt understood

## 2014-01-11 NOTE — Telephone Encounter (Signed)
per pof to sch pt appt-sent MW a email to sch trmt-adv pt once reply i will call w/appt time for 10/23

## 2014-01-11 NOTE — Progress Notes (Signed)
[place]  [phone] Fax:(336) [phone]     ID: LURENE ROBLEY DOB: November 01, 1945  MR#: [id number]  LZJ#:[id number]  PCP: Ann Held, MD GYN: Newton Pigg MD SU:  OTHER MD: Clarene Essex MD, Leslie Andrea M.D.  CHIEF COMPLAINT: Newly diagnosed metastatic breast cancer  CURRENT TREATMENT: Zolendronate, anastrozole   BREAST CANCER HISTORY: From the earlier summary notes:  Taytum was referred to Dr. Clarene Essex for evaluation of abdominal discomfort and nausea. Exam and blood work including liver function tests was unremarkable aside from normochromic normocytic anemia. Cholecystitis was suspected and the patient underwent endoscopy followed by abdominal/ pelvic CT scan on 09/29/2013. This showed showed a normal gallbladder. Incidental findings included multiple simple cysts in the liver and aortic atherosclerosis. However mixed lytic and sclerotic bony lesions were noted. On 10/10/2013 the patient underwent biopsy of the right iliac bone, and this showed ([id number]) metastatic invasive ductal carcinoma, grade 2, estrogen and progesterone receptor strongly positive.  Rosene has a remote history of breast cancer, which we tried to reconstruct. She was originally diagnosed early in 1997 with stage III disease, and underwent right mastectomy followed by adjuvant chemotherapy. She then participated in a [facility] protocol with high-dose chemotherapy followed by stem cell rescue 12/22/1995. Unfortunately later that year liver lesions were noted and were biopsy-proven to be metastatic breast cancer. This was not felt to be resectable. However the tumor was estrogen receptor positive and HER-2 positive. The patient was treated with Herceptin (she does not know for what period of time) and was then on tamoxifen until November of 2002. She also participated in a vaccine study at [facility].  Sherran's subsequent history is as detailed below  INTERVAL HISTORY: Adair returns today  for followup of her metastatic breast cancer accompanied by her husband [name]. She has been on anastrozole, and is tolerating that well. In particular hot flashes and vaginal dryness are not major issues. She thinks the anastrozole may be working, since her scalp lesions are little smaller, perhaps, and other subcutaneous lesions she could palpate in her left lower quadrant and left flank are now much harder to find  REVIEW OF SYSTEMS: Deari had some pain after her last zolendronate, which is not uncommon of course. However pain in the left hip area has persisted and gotten a little bit worse. She is taking Dilaudid up to 3 times a day for pain control. She did not get good control with nonsteroidals. The Dilaudid constipated her initially but she is taking care of adequate MiraLAX. She does get slightly nauseated, although she does not vomit. She tells me her appetite is down. Her sense of taste has not altered. She is not exactly confused about [name] tells me she just is not her usual sharp self, especially in the evenings. There have been no unusual headaches, visual changes, dizziness, gait imbalance, or falls. She feels she is drinking plenty of fluids. Sometimes her ankles swell slightly. This is usually bilateral, although left 1 tends to swell a little more than the right. She has some night sweats rarely. There have been no cough, phlegm production, or pleurisy. A detailed review of systems today was otherwise stable  PAST MEDICAL HISTORY: Past Medical History  Diagnosis Date  . Cancer     Breast 1997  . TIA (transient ischemic attack)   . Hypothyroidism   . Hypercholesterolemia   . Osteoarthritis   . GERD (gastroesophageal reflux disease)     PAST SURGICAL HISTORY: Past Surgical History  Procedure  Laterality Date  . Mastectomy modified radical Right   . Cataract extraction, bilateral    . Total knee arthroplasty Left   . Tonsillectomy    . Tubal ligation      FAMILY HISTORY No  family history on file. The patient's father died at the age of 69 from heart disease. The patient's mother died at the age of 69. The patient had no brothers, one sister. One of the patient's mother's 5 sisters was diagnosed with breast cancer in her 69s  GYNECOLOGIC HISTORY:  No LMP recorded. Patient is postmenopausal. Menarche age 14, the patient is GX P0. She stopped having periods approximately 1994. She used hormone replacement until 1997. 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 Her husband [name] (goes by "[name]" was [employer]. of information all services at [employer]. They live alone, with no pets.    ADVANCED DIRECTIVES: In place   HEALTH MAINTENANCE: History  Substance Use Topics  . Smoking status: Never Smoker   . Smokeless tobacco: Not on file  . Alcohol Use: Not on file     Colonoscopy: 2012  PAP: 2010  Bone density: 2012  Lipid panel:  Allergies  Allergen Reactions  . Amoxicillin Hives  . Percocet [Oxycodone-Acetaminophen] Nausea And Vomiting  . Percodan [Oxycodone-Aspirin] Nausea And Vomiting    Current Outpatient Prescriptions  Medication Sig Dispense Refill  . acetaminophen (TYLENOL) 500 MG tablet Take 1,000 mg by mouth every 6 (six) hours as needed for mild pain.      Marland Kitchen anastrozole (ARIMIDEX) 1 MG tablet Take 1 tablet (1 mg total) by mouth daily.  90 tablet  3  . Ascorbic Acid (VITAMIN C) 1000 MG tablet Take 1,000 mg by mouth daily.      Marland Kitchen aspirin EC 325 MG tablet Take 325 mg by mouth daily.      Marland Kitchen azelastine (ASTELIN) 0.1 % nasal spray Place 1 spray into both nostrils 2 (two) times daily. Use in each nostril as directed      . beta carotene 15 MG capsule Take 15 mg by mouth daily.      . calcium carbonate (OS-CAL) 600 MG TABS tablet Take 600 mg by mouth 2 (two) times daily with a meal.      . Calcium Carbonate-Vitamin D (CALCIUM + D PO) Take 1 tablet  by mouth 2 (two) times daily.      . Cholecalciferol 1000 UNITS tablet Take 1,000 Units by mouth daily.      . diclofenac sodium (VOLTAREN) 1 % GEL Apply 2 g topically 4 (four) times daily as needed. Pain      . gabapentin (NEURONTIN) 300 MG capsule Take 1 capsule (300 mg total) by mouth 4 (four) times daily as needed.  120 capsule  3  . HYDROmorphone (DILAUDID) 4 MG tablet Take 1 tablet (4 mg total) by mouth every 4 (four) hours as needed for severe pain.  60 tablet  0  . levothyroxine (SYNTHROID, LEVOTHROID) 88 MCG tablet Take 88 mcg by mouth daily before breakfast.      . LORazepam (ATIVAN) 0.5 MG tablet Take 1 tablet (0.5 mg total) by mouth every 8 (eight) hours.  4 tablet  0  . Lutein 6 MG TABS Take 6 mg by mouth daily.      Marland Kitchen METRONIDAZOLE, TOPICAL, 0.75 % LOTN Apply 1 application topically daily.      . Misc  Natural Products (GLUCOSAMINE CHONDROITIN MSM PO) Take 2 tablets by mouth 2 (two) times daily.      . Multiple Vitamins-Minerals (MULTIVITAMIN WITH MINERALS) tablet Take 1 tablet by mouth daily.      . naproxen (NAPROSYN) 500 MG tablet Take 1 tablet (500 mg total) by mouth 2 (two) times daily with a meal.  60 tablet  1  . pantoprazole (PROTONIX) 40 MG tablet Take 40 mg by mouth 2 (two) times daily.      Vladimir Faster Glycol-Propyl Glycol (SYSTANE OP) Apply 1 drop to eye 3 (three) times daily.      Marland Kitchen PROCTOSOL HC 2.5 % rectal cream       . simvastatin (ZOCOR) 40 MG tablet Take 40 mg by mouth daily.      . traMADol (ULTRAM) 50 MG tablet Take 1 tablet (50 mg total) by mouth every 6 (six) hours as needed.  120 tablet  0  . vitamin E (VITAMIN E) 400 UNIT capsule Take 400 Units by mouth daily.       No current facility-administered medications for this visit.    OBJECTIVE: Middle-aged white woman in mild to moderate distress Filed Vitals:   01/11/14 1030  BP: 137/72  Pulse: 67  Temp: 98.5 F (36.9 C)  Resp: 18     Body mass index is 24.64 kg/(m^2).    ECOG FS:2 - Symptomatic, <50%  confined to bed  Ocular: Sclerae unicteric, pupils round and equal Ear-nose-throat: Oropharynx clear, dentition in good repair Lymphatic: No cervical or supraclavicular adenopathy Lungs no rales or rhonchi, good excursion bilaterally Heart regular rate and rhythm Abd soft, nontender, positive bowel sounds, no masses palpated MSK no focal spinal tenderness, no upper extremity lymphedema, no significant ankle edema bilaterally Neuro: non-focal, well-oriented, positive affect Breasts: Deferred Skin: Subcutaneous nodules over the right temporal scalp area appear unchanged to possibly slightly smaller. They're not significantly softer. The patient had subcutaneous nodules palpable in the lower left quadrant of the abdomen and left flank. The one in the left flank is no longer palpable. The one in the left lower abdomen is perhaps the size of a small rice grain.  LAB RESULTS: Outside labs obtained at [facility] 12/27/2013 showed a white cell count of 3.9 hemoglobin 9.3 platelets 346,000 creatinine 0.7 calcium 9.0 albumin 4.1 alkaline phosphatase 173 ALT 12 AST 22. Other results are separately scanned  CMP     Component Value Date/Time   NA 138 10/12/2013 1225   NA 141 01/17/2011 0946   K 4.3 10/12/2013 1225   K 4.3 01/17/2011 0946   CL 101 01/17/2011 0946   CO2 27 10/12/2013 1225   CO2 31 01/17/2011 0946   GLUCOSE 88 10/12/2013 1225   GLUCOSE 68* 01/17/2011 0946   BUN 14.0 10/12/2013 1225   BUN 19 01/17/2011 0946   CREATININE 0.9 10/12/2013 1225   CREATININE 0.87 01/17/2011 0946   CALCIUM 9.9 10/12/2013 1225   CALCIUM 9.7 01/17/2011 0946   PROT 7.6 10/12/2013 1225   PROT 6.9 01/17/2011 0946   ALBUMIN 3.2* 10/12/2013 1225   ALBUMIN 4.7 01/17/2011 0946   AST 35* 10/12/2013 1225   AST 21 01/17/2011 0946   ALT 11 10/12/2013 1225   ALT 17 01/17/2011 0946   ALKPHOS 110 10/12/2013 1225   ALKPHOS 75 01/17/2011 0946   BILITOT 0.28 10/12/2013 1225   BILITOT 0.3 01/17/2011 0946     I No results found for this basename: SPEP,  UPEP,   kappa and lambda  light chains    Lab Results  Component Value Date   WBC 3.7* 01/11/2014   NEUTROABS 2.2 01/11/2014   HGB 9.3* 01/11/2014   HCT 30.4* 01/11/2014   MCV 84.1 01/11/2014   PLT 325 01/11/2014      Chemistry      Component Value Date/Time   NA 138 10/12/2013 1225   NA 141 01/17/2011 0946   K 4.3 10/12/2013 1225   K 4.3 01/17/2011 0946   CL 101 01/17/2011 0946   CO2 27 10/12/2013 1225   CO2 31 01/17/2011 0946   BUN 14.0 10/12/2013 1225   BUN 19 01/17/2011 0946   CREATININE 0.9 10/12/2013 1225   CREATININE 0.87 01/17/2011 0946      Component Value Date/Time   CALCIUM 9.9 10/12/2013 1225   CALCIUM 9.7 01/17/2011 0946   ALKPHOS 110 10/12/2013 1225   ALKPHOS 75 01/17/2011 0946   AST 35* 10/12/2013 1225   AST 21 01/17/2011 0946   ALT 11 10/12/2013 1225   ALT 17 01/17/2011 0946   BILITOT 0.28 10/12/2013 1225   BILITOT 0.3 01/17/2011 0946       No results found for this basename: LABCA2    No components found with this basename: LABCA125    No results found for this basename: INR,  in the last 168 hours  Urinalysis No results found for this basename: colorurine,  appearanceur,  labspec,  phurine,  glucoseu,  hgbur,  bilirubinur,  ketonesur,  proteinur,  urobilinogen,  nitrite,  leukocytesur    STUDIES: Dg Hip Complete Left  01/11/2014   CLINICAL DATA:  History of metastatic breast carcinoma, worsening left hip pain  EXAM: LEFT HIP - COMPLETE 2+ VIEW  COMPARISON:  CT abdomen pelvis of 09/29/2013  FINDINGS: As noted on the prior CT, diffuse mixed blastic and lytic bone metastases are present. There has been increase in size of a blastic metastatic lesion involving the left intertrochanteric femur laterally when compared to the prior CT images. No acute or impending fracture is seen. There is also degenerative joint disease of the hips left-greater-than-right. The pelvic rami are intact.  IMPRESSION: 1. Some  increase in size of a blastic metastatic lesion in the left femoral intertrochanteric region with diffuse primarily blastic bone metastases throughout the pelvis and hips. 2. No impending fracture is seen. 3. 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   Electronically Signed   By: Ivar Drape M.D.   On: 01/11/2014 14:56   Mm Screening Breast Tomo Uni L  01/11/2014   CLINICAL DATA:  Screening.  EXAM: DIGITAL SCREENING UNILATERAL LEFT MAMMOGRAM WITH TOMO AND CAD  COMPARISON:  Previous exam(s).  ACR Breast Density Category d: The breast tissue is extremely dense, which lowers the sensitivity of mammography.  FINDINGS: There are no findings suspicious for malignancy. Images were processed with CAD.  IMPRESSION: No mammographic evidence of malignancy. A result letter of this screening mammogram will be mailed directly to the patient.  RECOMMENDATION: Screening mammogram in one year. (Code:[id number]-B-01Y)  BI-RADS CATEGORY  1: Negative.   Electronically Signed   By: Skipper Cliche M.D.   On: 01/11/2014 13:15   ASSESSMENT: 68 y.o. [place] woman with stage IV breast cancer (bone metastatic disease)  (1) s/p Right mastectomy 1997 for an estrogen receptor and HER-2 positive breast cancer, treated with  (a) adjuvant chemotherapy according to CALGB 9640 (taxotere x 4 cycles)  (b) high-dose chemotherapy (cyclophosphamide, carboplatin, BCNU) followed by stem cell rescue at [facility] 12/22/1995  (  c) biopsy-proven metastatic disease November 2007, estrogen receptor and HER-2 positive  (d) trastuzumab (dose? Duration?)  (e) tamoxifen for 5 years completed 2002  (f) participation in a vaccine study at [facility] 2003 (CEA primed dendritic cells, HER-2 primed dendritic cells)    (2) status post right iliac crest biopsy 10/10/2013 for invasive ductal carcinoma, grade 2, estrogen and progesterone receptor positive  (3) zolendronate started 10/12/2013, to be repeated every 12 weeks  (a) dexa scan 07/28/2013  normal  (4) biopsy of a scalp lesion 10/17/2013 showed metastatic breast cancer, HER-2/neu negative  (5) anastrozole started 10/28/2013  (a) measurable disease = subcutaneous nodules in scalp and LLQ abdomen  (6) neoplasia associated pain  (a) painful blastic lesion left femoral intertrochanteric region  PLAN: I spent approximately one hour today with Jana Half and her husband are going over her situation. She understands she has a chronic illness which we cannot cure. We are doing our best to control it with a minimum of interference with her quality of life. Certainly we could go to chemotherapy but I do not see any indication for that at present.  Zelena is tolerating the anastrozole well. It appears to be working. The subcutaneous lesions are smaller, both in her left lower quadrants and scalp. They plan is going to be to continue the anastrozole and consider repeating a PET scan before her visit here 3 months from now.  She is having more pain in her left hip. I am not sure whether there is a possible fracture there or not so we are obtaining plain films today. She has slight problems with nausea and "lumpiness" due to dilaudid, but it is controlling her pain well and it is not constipating her. Accordingly we are continuing dilaudid as before.   She was going to be going on a cruise in December but that is not going to be possible given her current functional status. They will cancel that. Hopefully we'll be able to get it refunded.  She will return on Friday for zolendronate and we will repeat that at the next visit here. She does get some pain in her bones from that but actually may be a good sign. She is going to take some extra calcium for the next 2 days to prevent hypoglycemia issues.  Before her next visit here in 3 months we will obtain a brain MRI and repeat PET scan. If we find evidence of disease progression we would go to letrozole and Palbociclib.  ADDENDUM: Left hip films show  a significant blastic lesion in the left intertrochanteric area. The fact that it appears larger may simply be due to her being on zolendronate. It is however painful. I believe she will benefit from radiation to this area. I will refer her for that.   Chauncey Cruel, MD   01/11/2014 10:51 AM

## 2014-01-11 NOTE — Telephone Encounter (Signed)
Per staff message and POF I have scheduled appts. Advised scheduler of appts. JMW  

## 2014-01-12 ENCOUNTER — Other Ambulatory Visit: Payer: Self-pay | Admitting: Oncology

## 2014-01-12 ENCOUNTER — Telehealth: Payer: Self-pay | Admitting: Oncology

## 2014-01-12 ENCOUNTER — Other Ambulatory Visit: Payer: Self-pay | Admitting: *Deleted

## 2014-01-12 ENCOUNTER — Telehealth: Payer: Self-pay | Admitting: *Deleted

## 2014-01-12 DIAGNOSIS — C50919 Malignant neoplasm of unspecified site of unspecified female breast: Secondary | ICD-10-CM

## 2014-01-12 DIAGNOSIS — G893 Neoplasm related pain (acute) (chronic): Secondary | ICD-10-CM | POA: Insufficient documentation

## 2014-01-12 DIAGNOSIS — C7951 Secondary malignant neoplasm of bone: Principal | ICD-10-CM

## 2014-01-12 MED ORDER — LORAZEPAM 0.5 MG PO TABS: ORAL_TABLET | ORAL | Status: DC

## 2014-01-12 NOTE — Telephone Encounter (Signed)
Per staff phone call and POF I have schedueld appts. Scheduler advised of appts.  JMW  

## 2014-01-13 ENCOUNTER — Ambulatory Visit (HOSPITAL_BASED_OUTPATIENT_CLINIC_OR_DEPARTMENT_OTHER): Payer: Medicare Other

## 2014-01-13 ENCOUNTER — Other Ambulatory Visit: Payer: Self-pay | Admitting: *Deleted

## 2014-01-13 ENCOUNTER — Other Ambulatory Visit: Payer: Self-pay | Admitting: Emergency Medicine

## 2014-01-13 ENCOUNTER — Other Ambulatory Visit: Payer: Medicare Other

## 2014-01-13 ENCOUNTER — Telehealth: Payer: Self-pay | Admitting: Oncology

## 2014-01-13 VITALS — BP 154/73 | HR 63 | Temp 98.5°F | Resp 18

## 2014-01-13 DIAGNOSIS — D510 Vitamin B12 deficiency anemia due to intrinsic factor deficiency: Secondary | ICD-10-CM | POA: Diagnosis not present

## 2014-01-13 DIAGNOSIS — C50919 Malignant neoplasm of unspecified site of unspecified female breast: Secondary | ICD-10-CM

## 2014-01-13 DIAGNOSIS — C7951 Secondary malignant neoplasm of bone: Secondary | ICD-10-CM

## 2014-01-13 MED ORDER — ZOLEDRONIC ACID 4 MG/100ML IV SOLN
4.0000 mg | Freq: Once | INTRAVENOUS | Status: AC
  Administered 2014-01-13: 4 mg via INTRAVENOUS
  Filled 2014-01-13: qty 100

## 2014-01-13 NOTE — Telephone Encounter (Signed)
, °

## 2014-01-13 NOTE — Patient Instructions (Signed)

## 2014-01-16 ENCOUNTER — Other Ambulatory Visit: Payer: Self-pay | Admitting: *Deleted

## 2014-01-20 DIAGNOSIS — D510 Vitamin B12 deficiency anemia due to intrinsic factor deficiency: Secondary | ICD-10-CM | POA: Diagnosis not present

## 2014-01-25 DIAGNOSIS — M25562 Pain in left knee: Secondary | ICD-10-CM | POA: Diagnosis not present

## 2014-01-25 DIAGNOSIS — Z96652 Presence of left artificial knee joint: Secondary | ICD-10-CM | POA: Diagnosis not present

## 2014-01-25 DIAGNOSIS — M25552 Pain in left hip: Secondary | ICD-10-CM | POA: Diagnosis not present

## 2014-01-25 DIAGNOSIS — Z853 Personal history of malignant neoplasm of breast: Secondary | ICD-10-CM | POA: Diagnosis not present

## 2014-01-25 DIAGNOSIS — E785 Hyperlipidemia, unspecified: Secondary | ICD-10-CM | POA: Diagnosis not present

## 2014-01-25 DIAGNOSIS — E039 Hypothyroidism, unspecified: Secondary | ICD-10-CM | POA: Diagnosis not present

## 2014-01-25 DIAGNOSIS — Z79899 Other long term (current) drug therapy: Secondary | ICD-10-CM | POA: Diagnosis not present

## 2014-01-25 DIAGNOSIS — C7951 Secondary malignant neoplasm of bone: Secondary | ICD-10-CM | POA: Diagnosis not present

## 2014-01-27 DIAGNOSIS — D510 Vitamin B12 deficiency anemia due to intrinsic factor deficiency: Secondary | ICD-10-CM | POA: Diagnosis not present

## 2014-01-27 DIAGNOSIS — C7951 Secondary malignant neoplasm of bone: Secondary | ICD-10-CM | POA: Diagnosis not present

## 2014-01-27 DIAGNOSIS — Z510 Encounter for antineoplastic radiation therapy: Secondary | ICD-10-CM | POA: Diagnosis not present

## 2014-01-30 DIAGNOSIS — Z510 Encounter for antineoplastic radiation therapy: Secondary | ICD-10-CM | POA: Diagnosis not present

## 2014-01-30 DIAGNOSIS — C7951 Secondary malignant neoplasm of bone: Secondary | ICD-10-CM | POA: Diagnosis not present

## 2014-02-02 DIAGNOSIS — Z510 Encounter for antineoplastic radiation therapy: Secondary | ICD-10-CM | POA: Diagnosis not present

## 2014-02-02 DIAGNOSIS — C7951 Secondary malignant neoplasm of bone: Secondary | ICD-10-CM | POA: Diagnosis not present

## 2014-02-03 DIAGNOSIS — Z510 Encounter for antineoplastic radiation therapy: Secondary | ICD-10-CM | POA: Diagnosis not present

## 2014-02-03 DIAGNOSIS — D510 Vitamin B12 deficiency anemia due to intrinsic factor deficiency: Secondary | ICD-10-CM | POA: Diagnosis not present

## 2014-02-03 DIAGNOSIS — C7951 Secondary malignant neoplasm of bone: Secondary | ICD-10-CM | POA: Diagnosis not present

## 2014-02-06 DIAGNOSIS — Z510 Encounter for antineoplastic radiation therapy: Secondary | ICD-10-CM | POA: Diagnosis not present

## 2014-02-06 DIAGNOSIS — C7951 Secondary malignant neoplasm of bone: Secondary | ICD-10-CM | POA: Diagnosis not present

## 2014-02-07 DIAGNOSIS — C7951 Secondary malignant neoplasm of bone: Secondary | ICD-10-CM | POA: Diagnosis not present

## 2014-02-07 DIAGNOSIS — Z510 Encounter for antineoplastic radiation therapy: Secondary | ICD-10-CM | POA: Diagnosis not present

## 2014-02-08 DIAGNOSIS — Z510 Encounter for antineoplastic radiation therapy: Secondary | ICD-10-CM | POA: Diagnosis not present

## 2014-02-08 DIAGNOSIS — C7951 Secondary malignant neoplasm of bone: Secondary | ICD-10-CM | POA: Diagnosis not present

## 2014-02-09 DIAGNOSIS — C7951 Secondary malignant neoplasm of bone: Secondary | ICD-10-CM | POA: Diagnosis not present

## 2014-02-09 DIAGNOSIS — Z510 Encounter for antineoplastic radiation therapy: Secondary | ICD-10-CM | POA: Diagnosis not present

## 2014-02-10 DIAGNOSIS — C7951 Secondary malignant neoplasm of bone: Secondary | ICD-10-CM | POA: Diagnosis not present

## 2014-02-10 DIAGNOSIS — Z510 Encounter for antineoplastic radiation therapy: Secondary | ICD-10-CM | POA: Diagnosis not present

## 2014-02-10 DIAGNOSIS — D510 Vitamin B12 deficiency anemia due to intrinsic factor deficiency: Secondary | ICD-10-CM | POA: Diagnosis not present

## 2014-02-13 DIAGNOSIS — C7951 Secondary malignant neoplasm of bone: Secondary | ICD-10-CM | POA: Diagnosis not present

## 2014-02-13 DIAGNOSIS — Z510 Encounter for antineoplastic radiation therapy: Secondary | ICD-10-CM | POA: Diagnosis not present

## 2014-02-14 DIAGNOSIS — C7951 Secondary malignant neoplasm of bone: Secondary | ICD-10-CM | POA: Diagnosis not present

## 2014-02-14 DIAGNOSIS — Z510 Encounter for antineoplastic radiation therapy: Secondary | ICD-10-CM | POA: Diagnosis not present

## 2014-02-15 DIAGNOSIS — C7951 Secondary malignant neoplasm of bone: Secondary | ICD-10-CM | POA: Diagnosis not present

## 2014-02-15 DIAGNOSIS — D510 Vitamin B12 deficiency anemia due to intrinsic factor deficiency: Secondary | ICD-10-CM | POA: Diagnosis not present

## 2014-02-15 DIAGNOSIS — Z510 Encounter for antineoplastic radiation therapy: Secondary | ICD-10-CM | POA: Diagnosis not present

## 2014-02-22 DIAGNOSIS — D510 Vitamin B12 deficiency anemia due to intrinsic factor deficiency: Secondary | ICD-10-CM | POA: Diagnosis not present

## 2014-02-22 DIAGNOSIS — Z23 Encounter for immunization: Secondary | ICD-10-CM | POA: Diagnosis not present

## 2014-03-21 ENCOUNTER — Telehealth: Payer: Self-pay | Admitting: Oncology

## 2014-03-21 DIAGNOSIS — C50919 Malignant neoplasm of unspecified site of unspecified female breast: Secondary | ICD-10-CM | POA: Diagnosis not present

## 2014-03-21 DIAGNOSIS — C7951 Secondary malignant neoplasm of bone: Secondary | ICD-10-CM | POA: Diagnosis not present

## 2014-03-21 NOTE — Telephone Encounter (Signed)
S/w pt advised appt chg from 1/26 (md pal) to 05/10/14 @ 9am. Pt asked that zometa be same day as md visit. Moved zometa from 1/26 to 2/17 after md appt.

## 2014-03-22 ENCOUNTER — Other Ambulatory Visit: Payer: Self-pay | Admitting: *Deleted

## 2014-03-22 ENCOUNTER — Telehealth: Payer: Self-pay | Admitting: Oncology

## 2014-03-22 NOTE — Telephone Encounter (Signed)
per pof to sch pt appt back to orginal date 1/26-sent MW email to sch pt appt-will call pt after reply

## 2014-03-27 ENCOUNTER — Telehealth: Payer: Self-pay | Admitting: *Deleted

## 2014-03-27 NOTE — Telephone Encounter (Signed)
Per staff message and POF I have scheduled appts. Advised scheduler of appts. JMW  

## 2014-03-29 ENCOUNTER — Other Ambulatory Visit: Payer: Self-pay | Admitting: *Deleted

## 2014-03-30 DIAGNOSIS — D510 Vitamin B12 deficiency anemia due to intrinsic factor deficiency: Secondary | ICD-10-CM | POA: Diagnosis not present

## 2014-03-31 ENCOUNTER — Other Ambulatory Visit: Payer: Self-pay | Admitting: Oncology

## 2014-03-31 NOTE — Progress Notes (Unsigned)
Dr. Jana Hakim,  Jade Mooney finished palliative radiation to her left hip one month ago. I saw her in followup yesterday. She has been having right shoulder pain--it started towards the end of her treatment course with me but gradually improved such that she declined further imaging at that time. (Her previous PET/CT from earlier this year didn't show any uptake there.)  She asked that I inform you about her right shoulder pain in the event you wished to change her upcoming imaging scheduled for 04/11/2014. She said she's scheduled for PET/CT and an MRI. I told her that PET is likely sufficient and could be followed up with other test if equivocal...but assured her I would touch base with you...  Thanks-  Jade Mooney  [phone]

## 2014-04-11 ENCOUNTER — Encounter (HOSPITAL_COMMUNITY)
Admission: RE | Admit: 2014-04-11 | Discharge: 2014-04-11 | Disposition: A | Discharge status: Home / Self Care | Payer: Medicare Other | Source: Ambulatory Visit | Attending: Oncology | Admitting: Oncology

## 2014-04-11 ENCOUNTER — Other Ambulatory Visit (HOSPITAL_COMMUNITY): Payer: Medicare Other

## 2014-04-11 ENCOUNTER — Ambulatory Visit (HOSPITAL_COMMUNITY)
Admission: RE | Admit: 2014-04-11 | Discharge: 2014-04-11 | Disposition: A | Discharge status: Home / Self Care | Payer: Medicare Other | Source: Ambulatory Visit | Attending: Oncology | Admitting: Oncology

## 2014-04-11 ENCOUNTER — Other Ambulatory Visit: Payer: Self-pay | Admitting: Oncology

## 2014-04-11 ENCOUNTER — Ambulatory Visit (HOSPITAL_COMMUNITY): Payer: Medicare Other

## 2014-04-11 DIAGNOSIS — G458 Other transient cerebral ischemic attacks and related syndromes: Secondary | ICD-10-CM

## 2014-04-11 DIAGNOSIS — C7951 Secondary malignant neoplasm of bone: Principal | ICD-10-CM

## 2014-04-11 DIAGNOSIS — C7931 Secondary malignant neoplasm of brain: Secondary | ICD-10-CM | POA: Diagnosis not present

## 2014-04-11 DIAGNOSIS — C50912 Malignant neoplasm of unspecified site of left female breast: Secondary | ICD-10-CM | POA: Diagnosis not present

## 2014-04-11 DIAGNOSIS — C50919 Malignant neoplasm of unspecified site of unspecified female breast: Secondary | ICD-10-CM

## 2014-04-11 DIAGNOSIS — G893 Neoplasm related pain (acute) (chronic): Secondary | ICD-10-CM

## 2014-04-11 DIAGNOSIS — Z79899 Other long term (current) drug therapy: Secondary | ICD-10-CM | POA: Insufficient documentation

## 2014-04-11 LAB — POCT I-STAT CREATININE: Creatinine, Ser: 0.8 mg/dL (ref 0.50–1.10)

## 2014-04-11 LAB — GLUCOSE, CAPILLARY: Glucose-Capillary: 86 mg/dL (ref 70–99)

## 2014-04-11 MED ORDER — FLUDEOXYGLUCOSE F - 18 (FDG) INJECTION
6.8500 | Freq: Once | INTRAVENOUS | Status: AC | PRN
  Administered 2014-04-11: 6.85 via INTRAVENOUS

## 2014-04-11 MED ORDER — GADOBENATE DIMEGLUMINE 529 MG/ML IV SOLN
13.0000 mL | Freq: Once | INTRAVENOUS | Status: AC | PRN
  Administered 2014-04-11: 13 mL via INTRAVENOUS

## 2014-04-18 ENCOUNTER — Other Ambulatory Visit (HOSPITAL_BASED_OUTPATIENT_CLINIC_OR_DEPARTMENT_OTHER): Payer: Medicare Other

## 2014-04-18 ENCOUNTER — Ambulatory Visit: Payer: Medicare Other

## 2014-04-18 ENCOUNTER — Other Ambulatory Visit: Payer: Medicare Other

## 2014-04-18 ENCOUNTER — Ambulatory Visit: Payer: Medicare Other | Admitting: Oncology

## 2014-04-18 ENCOUNTER — Ambulatory Visit (HOSPITAL_BASED_OUTPATIENT_CLINIC_OR_DEPARTMENT_OTHER): Payer: Medicare Other

## 2014-04-18 DIAGNOSIS — C7951 Secondary malignant neoplasm of bone: Principal | ICD-10-CM

## 2014-04-18 DIAGNOSIS — C50919 Malignant neoplasm of unspecified site of unspecified female breast: Secondary | ICD-10-CM

## 2014-04-18 DIAGNOSIS — C792 Secondary malignant neoplasm of skin: Secondary | ICD-10-CM

## 2014-04-18 DIAGNOSIS — C50912 Malignant neoplasm of unspecified site of left female breast: Secondary | ICD-10-CM

## 2014-04-18 DIAGNOSIS — G893 Neoplasm related pain (acute) (chronic): Secondary | ICD-10-CM

## 2014-04-18 LAB — CBC WITH DIFFERENTIAL/PLATELET
BASO%: 0.6 % (ref 0.0–2.0)
Basophils Absolute: 0 10*3/uL (ref 0.0–0.1)
EOS%: 1.7 % (ref 0.0–7.0)
Eosinophils Absolute: 0.1 10*3/uL (ref 0.0–0.5)
HCT: 39.7 % (ref 34.8–46.6)
HGB: 12.5 g/dL (ref 11.6–15.9)
LYMPH%: 21 % (ref 14.0–49.7)
MCH: 25.9 pg (ref 25.1–34.0)
MCHC: 31.5 g/dL (ref 31.5–36.0)
MCV: 82.4 fL (ref 79.5–101.0)
MONO#: 0.5 10*3/uL (ref 0.1–0.9)
MONO%: 10.2 % (ref 0.0–14.0)
NEUT#: 3.1 10*3/uL (ref 1.5–6.5)
NEUT%: 66.5 % (ref 38.4–76.8)
Platelets: 263 10*3/uL (ref 145–400)
RBC: 4.82 10*6/uL (ref 3.70–5.45)
RDW: 17.1 % — ABNORMAL HIGH (ref 11.2–14.5)
WBC: 4.7 10*3/uL (ref 3.9–10.3)
lymph#: 1 10*3/uL (ref 0.9–3.3)

## 2014-04-18 LAB — COMPREHENSIVE METABOLIC PANEL (CC13)
ALT: 16 U/L (ref 0–55)
AST: 21 U/L (ref 5–34)
Albumin: 4.2 g/dL (ref 3.5–5.0)
Alkaline Phosphatase: 131 U/L (ref 40–150)
Anion Gap: 12 mEq/L — ABNORMAL HIGH (ref 3–11)
BUN: 12.3 mg/dL (ref 7.0–26.0)
CO2: 27 mEq/L (ref 22–29)
Calcium: 9 mg/dL (ref 8.4–10.4)
Chloride: 102 mEq/L (ref 98–109)
Creatinine: 0.8 mg/dL (ref 0.6–1.1)
EGFR: 73 mL/min/{1.73_m2} — ABNORMAL LOW (ref >90)
Glucose: 70 mg/dl (ref 70–140)
Potassium: 4 mEq/L (ref 3.5–5.1)
Sodium: 142 mEq/L (ref 136–145)
Total Bilirubin: 0.44 mg/dL (ref 0.20–1.20)
Total Protein: 6.8 g/dL (ref 6.4–8.3)

## 2014-04-18 MED ORDER — SODIUM CHLORIDE 0.9 % IV SOLN
Freq: Once | INTRAVENOUS | Status: AC
  Administered 2014-04-18: 10:00:00 via INTRAVENOUS

## 2014-04-18 MED ORDER — ZOLEDRONIC ACID 4 MG/100ML IV SOLN
4.0000 mg | Freq: Once | INTRAVENOUS | Status: AC
  Administered 2014-04-18: 4 mg via INTRAVENOUS
  Filled 2014-04-18: qty 100

## 2014-04-18 NOTE — Patient Instructions (Signed)

## 2014-04-28 DIAGNOSIS — D510 Vitamin B12 deficiency anemia due to intrinsic factor deficiency: Secondary | ICD-10-CM | POA: Diagnosis not present

## 2014-05-09 ENCOUNTER — Other Ambulatory Visit: Payer: Self-pay | Admitting: *Deleted

## 2014-05-10 ENCOUNTER — Other Ambulatory Visit: Payer: Self-pay | Admitting: *Deleted

## 2014-05-10 ENCOUNTER — Ambulatory Visit (HOSPITAL_BASED_OUTPATIENT_CLINIC_OR_DEPARTMENT_OTHER): Payer: Medicare Other | Admitting: Oncology

## 2014-05-10 ENCOUNTER — Telehealth: Payer: Self-pay | Admitting: Oncology

## 2014-05-10 ENCOUNTER — Telehealth: Payer: Self-pay | Admitting: *Deleted

## 2014-05-10 ENCOUNTER — Ambulatory Visit: Payer: Medicare Other

## 2014-05-10 VITALS — BP 156/86 | HR 60 | Temp 97.9°F | Resp 18 | Ht 64.0 in | Wt 141.8 lb

## 2014-05-10 DIAGNOSIS — C7951 Secondary malignant neoplasm of bone: Secondary | ICD-10-CM | POA: Diagnosis not present

## 2014-05-10 DIAGNOSIS — G893 Neoplasm related pain (acute) (chronic): Secondary | ICD-10-CM | POA: Diagnosis not present

## 2014-05-10 DIAGNOSIS — C792 Secondary malignant neoplasm of skin: Secondary | ICD-10-CM | POA: Diagnosis not present

## 2014-05-10 DIAGNOSIS — C50919 Malignant neoplasm of unspecified site of unspecified female breast: Secondary | ICD-10-CM

## 2014-05-10 DIAGNOSIS — G459 Transient cerebral ischemic attack, unspecified: Secondary | ICD-10-CM | POA: Diagnosis not present

## 2014-05-10 DIAGNOSIS — E039 Hypothyroidism, unspecified: Secondary | ICD-10-CM | POA: Diagnosis not present

## 2014-05-10 MED ORDER — HYDROMORPHONE HCL 4 MG PO TABS: 4.0000 mg | ORAL_TABLET | Freq: Four times a day (QID) | ORAL | Status: DC | PRN

## 2014-05-10 NOTE — Progress Notes (Signed)
[place]  [phone] [phone]     ID: Jade Mooney DOB: [birth year]  MR#: [id number]  EYC#:[id number]  PCP: Jade Held, MD GYN: Jade Pigg MD SU:  OTHER MD: Jade Essex MD, Jade Mooney M.D., Jade Mayer MD  CHIEF COMPLAINT: Newly diagnosed metastatic breast cancer  CURRENT TREATMENT: Zolendronate, anastrozole   BREAST CANCER HISTORY: From the earlier summary notes:  Jade Mooney was referred to Dr. Clarene Mooney for evaluation of abdominal discomfort and nausea. Exam and blood work including liver function tests was unremarkable aside from normochromic normocytic anemia. Cholecystitis was suspected and the patient underwent endoscopy followed by abdominal/ pelvic CT scan on 09/29/2013. This showed showed a normal gallbladder. Incidental findings included multiple simple cysts in the liver and aortic atherosclerosis. However mixed lytic and sclerotic bony lesions were noted. On 10/10/2013 the patient underwent biopsy of the right iliac bone, and this showed ([id number]) metastatic invasive ductal carcinoma, grade 2, estrogen and progesterone receptor strongly positive.  Jade Mooney has a remote history of breast cancer, which we tried to reconstruct. She was originally diagnosed early in 1997 with stage III disease, and underwent right mastectomy followed by adjuvant chemotherapy. She then participated in a [facility] protocol with high-dose chemotherapy followed by stem cell rescue 12/22/1995. Unfortunately later that year liver lesions were noted and were biopsy-proven to be metastatic breast cancer. This was not felt to be resectable. However the tumor was estrogen receptor positive and HER-2 positive. The patient was treated with Herceptin (she does not know for what period of time) and was then on tamoxifen until November of 2002. She also participated in a vaccine study at [facility].  Jade Mooney's subsequent history is as detailed below  INTERVAL  HISTORY: Jade Mooney returns today for followup of her metastatic breast cancer accompanied by her husband Jade Mooney. Since her last visit here she underwent radiation to her left hip area under Dr. Lula Mooney care. This was completed 02/13/2014. Jade Mooney did not immediately notice an improvement in her left hip pain, but by mid January she found that she was using less pain medicine and walking a little bit more easily. She continues on anastrozole, which she tolerates well aside from some nighttime hot flashes. She tried gabapentin for that but it made her ankles swell so she would rather live with the hot flashes, she says. She has noted that the scalp lesions, which were pathologically confirmed to be metastatic deposits, have almost completely resolved .  REVIEW OF SYSTEMS: Jade Mooney uses dilated primarily for pain, currently just 4 mg at bedtime. Rarely she uses one during the day. She takes Jade Mooney daily so she is not constipated. She also eats prunes, keeps a good fiber diet and keeps herself well hydrated. She denies any unusual headaches, visual changes, nausea, vomiting, dizziness, or gait imbalance. Aside from the left hip she has pain in the left knee. This is likely due to the fact that she is not walking with a normal gait because of the pain. She has a whole list of knee exercises from her prior knee replacement (of course that was the left knee as well) and she can try those. She also has some discomfort in the right shoulder, but that is getting better. Aside from these issues, a detailed review of systems today was stable  PAST MEDICAL HISTORY: Past Medical History  Diagnosis Date  . Cancer     Breast 1997  . TIA (transient ischemic attack)   . Hypothyroidism   . Hypercholesterolemia   .  Osteoarthritis   . GERD (gastroesophageal reflux disease)     PAST SURGICAL HISTORY: Past Surgical History  Procedure Laterality Date  . Mastectomy modified radical Right   . Cataract extraction, bilateral     . Total knee arthroplasty Left   . Tonsillectomy    . Tubal ligation      FAMILY HISTORY No family history on file. The patient's father died at the age of 39 from heart disease. The patient's mother died at the age of 52. The patient had no brothers, one sister. One of the patient's mother's 5 sisters was diagnosed with breast cancer in her 47s  GYNECOLOGIC HISTORY:  No LMP recorded. Patient is postmenopausal. Menarche age 25, the patient is GX P0. She stopped having periods approximately 1994. She used hormone replacement until 1997. 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 Her husband Jade Mooney (goes by "Jade Mooney" was [employer]. of information all services at [employer]. They live alone, with no pets.    ADVANCED DIRECTIVES: In place   HEALTH MAINTENANCE: History  Substance Use Topics  . Smoking status: Never Smoker   . Smokeless tobacco: Not on file  . Alcohol Use: Not on file     Colonoscopy: 2012  PAP: 2010  Bone density: 2012  Lipid panel:  Allergies  Allergen Reactions  . Amoxicillin Hives  . Percocet [Oxycodone-Acetaminophen] Nausea And Vomiting  . Percodan [Oxycodone-Aspirin] Nausea And Vomiting    Current Outpatient Prescriptions  Medication Sig Dispense Refill  . acetaminophen (TYLENOL) 500 MG tablet Take 1,000 mg by mouth every 6 (six) hours as needed for mild pain.    Marland Kitchen anastrozole (ARIMIDEX) 1 MG tablet Take 1 tablet (1 mg total) by mouth daily. 90 tablet 3  . Ascorbic Acid (VITAMIN C) 1000 MG tablet Take 1,000 mg by mouth daily.    Marland Kitchen aspirin EC 325 MG tablet Take 325 mg by mouth daily.    Marland Kitchen azelastine (ASTELIN) 0.1 % nasal spray Place 1 spray into both nostrils 2 (two) times daily. Use in each nostril as directed    . beta carotene 15 MG capsule Take 15 mg by mouth daily.    . calcium carbonate (OS-CAL) 600 MG TABS tablet Take 600 mg by mouth 2 (two)  times daily with a meal.    . Calcium Carbonate-Vitamin D (CALCIUM + D PO) Take 1 tablet by mouth 2 (two) times daily.    . Cholecalciferol 1000 UNITS tablet Take 1,000 Units by mouth daily.    . diclofenac sodium (VOLTAREN) 1 % GEL Apply 2 g topically 4 (four) times daily as needed. Pain    . gabapentin (NEURONTIN) 300 MG capsule Take 1 capsule (300 mg total) by mouth 4 (four) times daily as needed. 120 capsule 3  . HYDROmorphone (DILAUDID) 4 MG tablet Take 1 tablet (4 mg total) by mouth every 6 (six) hours as needed for severe pain. 120 tablet 0  . levothyroxine (SYNTHROID, LEVOTHROID) 88 MCG tablet Take 88 mcg by mouth daily before breakfast.    . LORazepam (ATIVAN) 0.5 MG tablet Use for symptoms associated with procedures/scans. Take one tab 30 minutes prior to procedure and repeat if needed. 10 tablet 0  . Lutein 6 MG TABS Take 6 mg by mouth daily.    Marland Kitchen METRONIDAZOLE, TOPICAL, 0.75 % LOTN Apply 1 application topically daily.    . Misc Natural Products (GLUCOSAMINE CHONDROITIN MSM PO)  Take 2 tablets by mouth 2 (two) times daily.    . Multiple Vitamins-Minerals (MULTIVITAMIN WITH MINERALS) tablet Take 1 tablet by mouth daily.    . naproxen (NAPROSYN) 500 MG tablet Take 1 tablet (500 mg total) by mouth 2 (two) times daily with a meal. 60 tablet 1  . pantoprazole (PROTONIX) 40 MG tablet Take 40 mg by mouth 2 (two) times daily.    Jade Mooney Glycol-Propyl Glycol (SYSTANE OP) Apply 1 drop to eye 3 (three) times daily.    Marland Kitchen PROCTOSOL HC 2.5 % rectal cream     . simvastatin (ZOCOR) 40 MG tablet Take 40 mg by mouth daily.    . traMADol (ULTRAM) 50 MG tablet Take 1 tablet (50 mg total) by mouth every 6 (six) hours as needed. 120 tablet 0  . vitamin E (VITAMIN E) 400 UNIT capsule Take 400 Units by mouth daily.     No current facility-administered medications for this visit.    OBJECTIVE: Middle-aged white woman in no acute distress Filed Vitals:   05/10/14 0901  BP: 156/86  Pulse: 60  Temp:  97.9 F (36.6 C)  Resp: 18     Body mass index is 24.33 kg/(m^2).    ECOG FS:1 - Symptomatic but completely ambulatory  Sclerae unicteric, pupils equal and reactive Oropharynx clear and moist-- no thrush or other lesions; dentition in good repair No cervical or supraclavicular adenopathy Lungs no rales or rhonchi Heart regular rate and rhythm Abd soft, nontender, positive bowel sounds MSK no focal spinal tenderness, no upper extremity lymphedema Neuro: nonfocal, well oriented, positive affect Breasts: Deferred Skin: I do not palpate the right prior to/temporal scalp lesions, or the left flank or left lower quadrant skin lesions previously noted  LAB RESULTS: CMP     Component Value Date/Time   NA 142 04/18/2014 0931   NA 141 01/17/2011 0946   K 4.0 04/18/2014 0931   K 4.3 01/17/2011 0946   CL 101 01/17/2011 0946   CO2 27 04/18/2014 0931   CO2 31 01/17/2011 0946   GLUCOSE 70 04/18/2014 0931   GLUCOSE 68* 01/17/2011 0946   BUN 12.3 04/18/2014 0931   BUN 19 01/17/2011 0946   CREATININE 0.8 04/18/2014 0931   CREATININE 0.80 04/11/2014 1026   CALCIUM 9.0 04/18/2014 0931   CALCIUM 9.7 01/17/2011 0946   PROT 6.8 04/18/2014 0931   PROT 6.9 01/17/2011 0946   ALBUMIN 4.2 04/18/2014 0931   ALBUMIN 4.7 01/17/2011 0946   AST 21 04/18/2014 0931   AST 21 01/17/2011 0946   ALT 16 04/18/2014 0931   ALT 17 01/17/2011 0946   ALKPHOS 131 04/18/2014 0931   ALKPHOS 75 01/17/2011 0946   BILITOT 0.44 04/18/2014 0931   BILITOT 0.3 01/17/2011 0946    I No results found for: SPEP  Lab Results  Component Value Date   WBC 4.7 04/18/2014   NEUTROABS 3.1 04/18/2014   HGB 12.5 04/18/2014   HCT 39.7 04/18/2014   MCV 82.4 04/18/2014   PLT 263 04/18/2014      Chemistry      Component Value Date/Time   NA 142 04/18/2014 0931   NA 141 01/17/2011 0946   K 4.0 04/18/2014 0931   K 4.3 01/17/2011 0946   CL 101 01/17/2011 0946   CO2 27 04/18/2014 0931   CO2 31 01/17/2011 0946   BUN 12.3  04/18/2014 0931   BUN 19 01/17/2011 0946   CREATININE 0.8 04/18/2014 0931   CREATININE 0.80 04/11/2014 1026  Component Value Date/Time   CALCIUM 9.0 04/18/2014 0931   CALCIUM 9.7 01/17/2011 0946   ALKPHOS 131 04/18/2014 0931   ALKPHOS 75 01/17/2011 0946   AST 21 04/18/2014 0931   AST 21 01/17/2011 0946   ALT 16 04/18/2014 0931   ALT 17 01/17/2011 0946   BILITOT 0.44 04/18/2014 0931   BILITOT 0.3 01/17/2011 0946       Lab Results  Component Value Date   LABCA2 993* 01/11/2014    No components found for: [id number]  No results for input(s): INR in the last 168 hours.  Urinalysis No results found for: COLORURINE  STUDIES: Mr Kizzie Fantasia Contrast  May 07, 2014   CLINICAL DATA:  69 year old female with metastatic breast cancer. Osseous metastatic disease with suggestion of dural thickening. Restaging. Subsequent encounter.  EXAM: MRI HEAD WITHOUT AND WITH CONTRAST  TECHNIQUE: Multiplanar, multiecho pulse sequences of the brain and surrounding structures were obtained without and with intravenous contrast.  CONTRAST:  33mL MULTIHANCE GADOBENATE DIMEGLUMINE 529 MG/ML IV SOLN  COMPARISON:  Brain MRI 11/07/2013 and earlier.  FINDINGS: Diffuse abnormal bone marrow signal re- identified. Less motion artifact on sagittal and coronal post-contrast images today. Mild dural thickening described on the prior study does not persist. No abnormal leptomeningeal or pachymeningeal enhancement identified. No abnormal parenchymal brain enhancement. No midline shift, mass effect, or evidence of intracranial mass lesion.  Major intracranial vascular flow voids are stable.  Patchy and confluent cerebral white matter T2 and FLAIR hyperintensity re- identified and not significantly changed. No cortical encephalomalacia. Otherwise gray and white matter signal within normal limits for age. Grossly normal visualized cervical spinal cord.  Visible internal auditory structures appear normal. Mastoids remain clear.  Mild paranasal sinus mucosal thickening today. Stable orbits soft tissues. Visualized scalp soft tissues are within normal limits.  IMPRESSION: 1. Continued diffuse osseous metastatic disease. No dural or brain parenchymal metastasis identified. 2. No new intracranial abnormality identified. Chronic age advanced but nonspecific cerebral white matter signal changes, perhaps due to small vessel disease.   Electronically Signed   By: Jade Mooney M.D.   On: 05/07/14 11:38   Nm Pet Image Restag (ps) Skull Base To Thigh  May 07, 2014   CLINICAL DATA:  Subsequent treatment strategy for breast carcinoma. Left breast carcinoma. Brain bone metastasis.  EXAM: NUCLEAR MEDICINE PET SKULL BASE TO THIGH  TECHNIQUE: 6.8 mCi F-18 FDG was injected intravenously. Full-ring PET imaging was performed from the skull base to thigh after the radiotracer. CT data was obtained and used for attenuation correction and anatomic localization.  FASTING BLOOD GLUCOSE:  Value: 86 mg/dl  COMPARISON:  PET-CT scan 10/21/2013  FINDINGS: NECK  No hypermetabolic lymph nodes in the neck.  CHEST  Within the medial left chest wall, there is a small subcutaneous nodule measuring 7 mm (image 61, series 4) which has low metabolic activity slightly decreased with SUV max 1.2 compared to 1.7. No hypermetabolic lymph nodes. No suspicious pulmonary nodules. Patient status post right mastectomy.  ABDOMEN/PELVIS  No abnormal metabolic activity liver. No hypermetabolic throughout pelvic lymph nodes. Normal uterus and ovaries.  SKELETON  Widespread diffuse sclerotic metastasis involving the bones of the pelvis, spine, ribs, sternum, and shoulder girdles. The sclerosis of these diffuse lesions is increased compared to prior while the metabolic activity is decreased compared to prior. For example in the left pelvis ischium SUV max 2.5 compares to SUV max 4.5.  IMPRESSION: 1. No evidence of breast cancer progression. 2. Increased sclerosis and decreased metabolic  activity  of diffuse skeletal metastasis is consistent with a positive chemotherapy response. 3. Small subcutaneous nodule in the left chest wall appears benign.   Electronically Signed   By: Jade Mooney M.D.   On: 04/11/2014 09:52     ASSESSMENT: 69 y.o. [place] woman with stage IV breast cancer (bone metastatic disease)  (1) s/p Right mastectomy 1997 for an estrogen receptor and HER-2 positive breast cancer, treated with  (a) adjuvant chemotherapy according to CALGB 9640 (taxotere x 4 cycles)  (b) high-dose chemotherapy (cyclophosphamide, carboplatin, BCNU) followed by stem cell rescue at [place] 12/22/1995  (c) biopsy-proven metastatic disease November 2007, estrogen receptor and HER-2 positive  (d) trastuzumab (dose? Duration?)  (e) tamoxifen for 5 years completed 2002  (f) participation in a vaccine study at [facility] 2003 (CEA primed dendritic cells, HER-2 primed dendritic cells)    (2) status post right iliac crest biopsy 10/10/2013 for invasive ductal carcinoma, grade 2, estrogen and progesterone receptor positive  (3) zolendronate started 10/12/2013, to be repeated every 12 weeks  (a) dexa scan 07/28/2013 normal  (4) biopsy of a scalp lesion 10/17/2013 showed metastatic breast cancer, HER-2/neu negative  (5) anastrozole started 10/28/2013  (a) measurable disease = subcutaneous nodules in scalp and LLQ abdomen  (6) neoplasia associated pain  (a) painful blastic lesion left femoral intertrochanteric region: Status post radiation completed November 2015  PLAN: Shavonda is doing remarkably well as far as her stage IV breast cancer is concerned. We again reviewed the fact that we do not have a cure for this condition and specifically I do not expect her left hip to ever return to normal. However it is improved after radiation and she is using less pain medicine. The measurable disease on her skin is now almost not measurable at all. In general, she is having a good response to anastrozole  and she is tolerating that well.  The plan accordingly he is to continue anastrozole daily, and to continue zolendronate every 3 months. Her next zolendronate dose will be April 21. They had to cancel their December cruise, but fortunately they did get reimbursed for that. They have a cruise planned for mid-April, both for her next zolendronate dose. I hope she will be able to enjoy that as much as he is planning to.  Otherwise she will see me again in April 21. I am not planning to do a PET scan at this point since she is doing clinically so well, but if we have evidence of clinical progression we would move to a PET scan at that point.  Yalda has a good understanding of the overall plan. She agrees with it. She knows the goal of treatment in her case is control. She will call with any problems that may develop before her next visit here.    Chauncey Cruel, MD   05/10/2014 9:06 AM

## 2014-05-10 NOTE — Telephone Encounter (Signed)
Per staff message and POF I have scheduled appts. Advised scheduler of appts. JMW  

## 2014-05-10 NOTE — Telephone Encounter (Signed)
per pof to sch pt appt-sent MW email to sch pt trmt-gave pt copy of sch-pt aware of zometa appts

## 2014-05-11 NOTE — Addendum Note (Signed)
Addended by: Laureen Abrahams on: 05/11/2014 02:05 PM   Modules accepted: Orders, Medications

## 2014-05-12 NOTE — Telephone Encounter (Signed)
none

## 2014-05-29 DIAGNOSIS — D510 Vitamin B12 deficiency anemia due to intrinsic factor deficiency: Secondary | ICD-10-CM | POA: Diagnosis not present

## 2014-06-23 DIAGNOSIS — E039 Hypothyroidism, unspecified: Secondary | ICD-10-CM | POA: Diagnosis not present

## 2014-06-23 DIAGNOSIS — D510 Vitamin B12 deficiency anemia due to intrinsic factor deficiency: Secondary | ICD-10-CM | POA: Diagnosis not present

## 2014-06-23 DIAGNOSIS — Z79899 Other long term (current) drug therapy: Secondary | ICD-10-CM | POA: Diagnosis not present

## 2014-06-23 DIAGNOSIS — D509 Iron deficiency anemia, unspecified: Secondary | ICD-10-CM | POA: Diagnosis not present

## 2014-06-28 DIAGNOSIS — C799 Secondary malignant neoplasm of unspecified site: Secondary | ICD-10-CM | POA: Diagnosis not present

## 2014-06-28 DIAGNOSIS — E782 Mixed hyperlipidemia: Secondary | ICD-10-CM | POA: Diagnosis not present

## 2014-06-28 DIAGNOSIS — D630 Anemia in neoplastic disease: Secondary | ICD-10-CM | POA: Diagnosis not present

## 2014-06-28 DIAGNOSIS — M25552 Pain in left hip: Secondary | ICD-10-CM | POA: Diagnosis not present

## 2014-06-28 DIAGNOSIS — C50919 Malignant neoplasm of unspecified site of unspecified female breast: Secondary | ICD-10-CM | POA: Diagnosis not present

## 2014-06-28 DIAGNOSIS — Z1389 Encounter for screening for other disorder: Secondary | ICD-10-CM | POA: Diagnosis not present

## 2014-06-28 DIAGNOSIS — D510 Vitamin B12 deficiency anemia due to intrinsic factor deficiency: Secondary | ICD-10-CM | POA: Diagnosis not present

## 2014-07-05 DIAGNOSIS — B9689 Other specified bacterial agents as the cause of diseases classified elsewhere: Secondary | ICD-10-CM | POA: Diagnosis not present

## 2014-07-05 DIAGNOSIS — J019 Acute sinusitis, unspecified: Secondary | ICD-10-CM | POA: Diagnosis not present

## 2014-07-13 ENCOUNTER — Other Ambulatory Visit (HOSPITAL_BASED_OUTPATIENT_CLINIC_OR_DEPARTMENT_OTHER): Payer: Medicare Other

## 2014-07-13 ENCOUNTER — Ambulatory Visit (HOSPITAL_BASED_OUTPATIENT_CLINIC_OR_DEPARTMENT_OTHER): Payer: Medicare Other

## 2014-07-13 ENCOUNTER — Telehealth: Payer: Self-pay | Admitting: Oncology

## 2014-07-13 ENCOUNTER — Ambulatory Visit: Payer: Medicare Other

## 2014-07-13 ENCOUNTER — Ambulatory Visit (HOSPITAL_COMMUNITY)
Admission: RE | Admit: 2014-07-13 | Discharge: 2014-07-13 | Disposition: A | Discharge status: Home / Self Care | Payer: Medicare Other | Source: Ambulatory Visit | Attending: Oncology | Admitting: Oncology

## 2014-07-13 ENCOUNTER — Ambulatory Visit (HOSPITAL_BASED_OUTPATIENT_CLINIC_OR_DEPARTMENT_OTHER): Payer: Medicare Other | Admitting: Oncology

## 2014-07-13 VITALS — BP 156/74 | HR 63 | Temp 98.4°F | Resp 18 | Ht 64.0 in | Wt 140.9 lb

## 2014-07-13 DIAGNOSIS — J449 Chronic obstructive pulmonary disease, unspecified: Secondary | ICD-10-CM | POA: Diagnosis not present

## 2014-07-13 DIAGNOSIS — C44501 Unspecified malignant neoplasm of skin of breast: Secondary | ICD-10-CM

## 2014-07-13 DIAGNOSIS — G458 Other transient cerebral ischemic attacks and related syndromes: Secondary | ICD-10-CM

## 2014-07-13 DIAGNOSIS — Z853 Personal history of malignant neoplasm of breast: Secondary | ICD-10-CM | POA: Diagnosis not present

## 2014-07-13 DIAGNOSIS — C7951 Secondary malignant neoplasm of bone: Secondary | ICD-10-CM

## 2014-07-13 DIAGNOSIS — C50919 Malignant neoplasm of unspecified site of unspecified female breast: Secondary | ICD-10-CM

## 2014-07-13 DIAGNOSIS — Z87891 Personal history of nicotine dependence: Secondary | ICD-10-CM | POA: Insufficient documentation

## 2014-07-13 DIAGNOSIS — R05 Cough: Secondary | ICD-10-CM | POA: Insufficient documentation

## 2014-07-13 DIAGNOSIS — G893 Neoplasm related pain (acute) (chronic): Secondary | ICD-10-CM

## 2014-07-13 DIAGNOSIS — M25552 Pain in left hip: Secondary | ICD-10-CM

## 2014-07-13 DIAGNOSIS — G459 Transient cerebral ischemic attack, unspecified: Secondary | ICD-10-CM | POA: Diagnosis not present

## 2014-07-13 DIAGNOSIS — C50912 Malignant neoplasm of unspecified site of left female breast: Secondary | ICD-10-CM

## 2014-07-13 LAB — CBC WITH DIFFERENTIAL/PLATELET
BASO%: 0.5 % (ref 0.0–2.0)
Basophils Absolute: 0 10*3/uL (ref 0.0–0.1)
EOS%: 1.4 % (ref 0.0–7.0)
Eosinophils Absolute: 0 10*3/uL (ref 0.0–0.5)
HCT: 37.5 % (ref 34.8–46.6)
HGB: 12.2 g/dL (ref 11.6–15.9)
LYMPH%: 37.2 % (ref 14.0–49.7)
MCH: 26.9 pg (ref 25.1–34.0)
MCHC: 32.5 g/dL (ref 31.5–36.0)
MCV: 82.8 fL (ref 79.5–101.0)
MONO#: 0.2 10*3/uL (ref 0.1–0.9)
MONO%: 8.3 % (ref 0.0–14.0)
NEUT#: 1.2 10*3/uL — ABNORMAL LOW (ref 1.5–6.5)
NEUT%: 52.6 % (ref 38.4–76.8)
Platelets: 163 10*3/uL (ref 145–400)
RBC: 4.53 10*6/uL (ref 3.70–5.45)
RDW: 15.6 % — ABNORMAL HIGH (ref 11.2–14.5)
WBC: 2.2 10*3/uL — ABNORMAL LOW (ref 3.9–10.3)
lymph#: 0.8 10*3/uL — ABNORMAL LOW (ref 0.9–3.3)

## 2014-07-13 LAB — COMPREHENSIVE METABOLIC PANEL (CC13)
ALT: 23 U/L (ref 0–55)
AST: 25 U/L (ref 5–34)
Albumin: 3.7 g/dL (ref 3.5–5.0)
Alkaline Phosphatase: 85 U/L (ref 40–150)
Anion Gap: 13 mEq/L — ABNORMAL HIGH (ref 3–11)
BUN: 10.3 mg/dL (ref 7.0–26.0)
CO2: 24 mEq/L (ref 22–29)
Calcium: 8.3 mg/dL — ABNORMAL LOW (ref 8.4–10.4)
Chloride: 101 mEq/L (ref 98–109)
Creatinine: 0.8 mg/dL (ref 0.6–1.1)
EGFR: 80 mL/min/{1.73_m2} — ABNORMAL LOW (ref >90)
Glucose: 85 mg/dl (ref 70–140)
Potassium: 4 mEq/L (ref 3.5–5.1)
Sodium: 138 mEq/L (ref 136–145)
Total Bilirubin: 0.23 mg/dL (ref 0.20–1.20)
Total Protein: 6.4 g/dL (ref 6.4–8.3)

## 2014-07-13 MED ORDER — HYDROMORPHONE HCL 4 MG PO TABS: 4.0000 mg | ORAL_TABLET | Freq: Four times a day (QID) | ORAL | Status: DC | PRN

## 2014-07-13 MED ORDER — SODIUM CHLORIDE 0.9 % IV SOLN: Freq: Once | INTRAVENOUS | Status: DC

## 2014-07-13 MED ORDER — ZOLEDRONIC ACID 4 MG/100ML IV SOLN
4.0000 mg | Freq: Once | INTRAVENOUS | Status: AC
  Administered 2014-07-13: 4 mg via INTRAVENOUS
  Filled 2014-07-13: qty 100

## 2014-07-13 NOTE — Telephone Encounter (Signed)
per pof ot sch pt appt-sent MW emai ot sch trmt-pt to get updated copy b4 leaving

## 2014-07-13 NOTE — Patient Instructions (Signed)

## 2014-07-13 NOTE — Progress Notes (Signed)
[place]  [phone] [phone]     ID: Jade Mooney DOB: [birth year]  MR#: [id number]  MWN#:[id number]  PCP: Jade Held, MD GYN: Jade Pigg MD SU:  OTHER MD: Jade Essex MD, Jade Mooney M.D., Jade Mayer MD  CHIEF COMPLAINT: Newly diagnosed metastatic breast cancer  CURRENT TREATMENT: Zolendronate, anastrozole   BREAST CANCER HISTORY: From the earlier summary notes:  Jade Mooney was referred to Dr. Clarene Mooney for evaluation of abdominal discomfort and nausea. Exam and blood work including liver function tests was unremarkable aside from normochromic normocytic anemia. Cholecystitis was suspected and the patient underwent endoscopy followed by abdominal/ pelvic CT scan on 09/29/2013. This showed showed a normal gallbladder. Incidental findings included multiple simple cysts in the liver and aortic atherosclerosis. However mixed lytic and sclerotic bony lesions were noted. On 10/10/2013 the patient underwent biopsy of the right iliac bone, and this showed ([id number]) metastatic invasive ductal carcinoma, grade 2, estrogen and progesterone receptor strongly positive.  Jade Mooney has a remote history of breast cancer, which we tried to reconstruct. She was originally diagnosed early in 1997 with stage III disease, and underwent right mastectomy followed by adjuvant chemotherapy. She then participated in a [facility] protocol with high-dose chemotherapy followed by stem cell rescue 12/22/1995. Unfortunately later that year liver lesions were noted and were biopsy-proven to be metastatic breast cancer. This was not felt to be resectable. However the tumor was estrogen receptor positive and HER-2 positive. The patient was treated with Herceptin (she does not know for what period of time) and was then on tamoxifen until November of 2002. She also participated in a vaccine study at [facility].  Jade Mooney's subsequent history is as detailed below  INTERVAL  HISTORY: Jade Mooney returns today for followup of her metastatic breast cancer accompanied by her husband Jade Mooney.  She continues on anastrozole, which she tolerates well. She obtains a three-month supply for about $18. Hot flashes and vaginal dryness are not a problem. She has not developed any arthralgias or myalgias that some patients can experience on this drug. She also tolerates the zolendronate we give her every 3 months without any obvious side effects.  REVIEW OF SYSTEMS: Jade Mooney on a 5 day cruise to the [place]. Unfortunately she had a fever and cough developing just as the cruise was beginning. She was put on a Z-Pak. That helped a fever, but then there was a lot of motion sickness and some diarrhea as well. She is actually recovering from all that. She still has a little bit of a cough, which is mostly nonproductive. There is no pleurisy or hemoptysis. She does have sinus problems in addition and still has some hoarseness. The trip also made her left hip pain a little bit worse. She takes diluted 4 mg at bedtime and that is the only pain medicine she is using at present. Obviously this is a great improvement on her prior. She sleeps fine with that. She wonders if she would do equally well with Ativan and recently got a prescription for that from Dr. Nicki Mooney. The dye wanted does not constipate her. She sometimes has to strain a little , but stools are not hard or bloody or black. A detailed review of systems today was otherwise stable.  Iincidentally Jade Mooney is receiving Lupron shots and doing well with those  PAST MEDICAL HISTORY: Past Medical History  Diagnosis Date  . Cancer     Breast 1997  . TIA (transient ischemic attack)   . Hypothyroidism   .  Hypercholesterolemia   . Osteoarthritis   . GERD (gastroesophageal reflux disease)     PAST SURGICAL HISTORY: Past Surgical History  Procedure Laterality Date  . Mastectomy modified radical Right   . Cataract extraction, bilateral    . Total  knee arthroplasty Left   . Tonsillectomy    . Tubal ligation      FAMILY HISTORY No family history on file. The patient's father died at the age of 64 from heart disease. The patient's mother died at the age of 29. The patient had no brothers, one sister. One of the patient's mother's 5 sisters was diagnosed with breast cancer in her 16s  GYNECOLOGIC HISTORY:  No LMP recorded. Patient is postmenopausal. Menarche age 57, the patient is GX P0. She stopped having periods approximately 1994. She used hormone replacement until 1997. 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 Her husband Jade Mooney (goes by "Jade Mooney" was [employer]. of information all services at [employer]. They live alone, with no pets.    ADVANCED DIRECTIVES: In place   HEALTH MAINTENANCE: History  Substance Use Topics  . Smoking status: Never Smoker   . Smokeless tobacco: Not on file  . Alcohol Use: Not on file     Colonoscopy: 2012  PAP: 2010  Bone density: 2012  Lipid panel:  Allergies  Allergen Reactions  . Amoxicillin Hives  . Percocet [Oxycodone-Acetaminophen] Nausea And Vomiting  . Percodan [Oxycodone-Aspirin] Nausea And Vomiting    Current Outpatient Prescriptions  Medication Sig Dispense Refill  . acetaminophen (TYLENOL) 500 MG tablet Take 1,000 mg by mouth every 6 (six) hours as needed for mild pain.    Marland Kitchen anastrozole (ARIMIDEX) 1 MG tablet Take 1 tablet (1 mg total) by mouth daily. 90 tablet 3  . Ascorbic Acid (VITAMIN C) 1000 MG tablet Take 1,000 mg by mouth daily.    Marland Kitchen aspirin EC 325 MG tablet Take 325 mg by mouth daily.    . beta carotene 15 MG capsule Take 15 mg by mouth daily.    . calcium carbonate (OS-CAL) 600 MG TABS tablet Take 600 mg by mouth 2 (two) times daily with a meal.    . Calcium Carbonate-Vitamin D (CALCIUM + D PO) Take 1 tablet by mouth 2 (two) times daily.    . Cholecalciferol 1000  UNITS tablet Take 1,000 Units by mouth daily.    Marland Kitchen HYDROmorphone (DILAUDID) 4 MG tablet Take 1 tablet (4 mg total) by mouth every 6 (six) hours as needed for severe pain. 120 tablet 0  . levothyroxine (SYNTHROID, LEVOTHROID) 88 MCG tablet Take 88 mcg by mouth daily before breakfast.    . LORazepam (ATIVAN) 0.5 MG tablet Use for symptoms associated with procedures/scans. Take one tab 30 minutes prior to procedure and repeat if needed. 10 tablet 0  . Lutein 6 MG TABS Take 6 mg by mouth daily.    Marland Kitchen METRONIDAZOLE, TOPICAL, 0.75 % LOTN Apply 1 application topically daily.    . Multiple Vitamins-Minerals (MULTIVITAMIN WITH MINERALS) tablet Take 1 tablet by mouth daily.    . pantoprazole (PROTONIX) 40 MG tablet Take 40 mg by mouth 2 (two) times daily.    Vladimir Faster Glycol-Propyl Glycol (SYSTANE OP) Apply 1 drop to eye 3 (three) times daily.    Marland Kitchen PROCTOSOL HC 2.5 % rectal cream     . simvastatin (ZOCOR) 40 MG tablet Take 40 mg by mouth daily.    Marland Kitchen  vitamin E (VITAMIN E) 400 UNIT capsule Take 400 Units by mouth daily.     No current facility-administered medications for this visit.    OBJECTIVE: Middle-aged white woman  In no acute distress Filed Vitals:   07/13/14 0919  BP: 156/74  Pulse: 63  Temp: 98.4 F (36.9 C)  Resp: 18     Body mass index is 24.17 kg/(m^2).    ECOG FS:1 - Symptomatic but completely ambulatory  Sclerae unicteric, pupils  Round and equal Oropharynx clear , good dentition No cervical or supraclavicular adenopathy Lungs no rales or rhonchi , no wheezes Heart regular rate and rhythm Abd soft, nontender, positive bowel sounds MSK no focal spinal tenderness, no upper extremity lymphedema Neuro: nonfocal, well oriented, positive affect Breasts:  The right breast is status post mastectomy. There is no evidence of chest wall recurrence. The right axilla is benign per the left breast is unremarkable. Skin:  The right temporal/parietal subcutaneous bumps that she had previously  have resolved. I also do not palpate any left lower quadrant or left flank subcutaneous metastatic deposits, which had been previously noted  LAB RESULTS: CMP     Component Value Date/Time   NA 142 04/18/2014 0931   NA 141 01/17/2011 0946   K 4.0 04/18/2014 0931   K 4.3 01/17/2011 0946   CL 101 01/17/2011 0946   CO2 27 04/18/2014 0931   CO2 31 01/17/2011 0946   GLUCOSE 70 04/18/2014 0931   GLUCOSE 68* 01/17/2011 0946   BUN 12.3 04/18/2014 0931   BUN 19 01/17/2011 0946   CREATININE 0.8 04/18/2014 0931   CREATININE 0.80 04/11/2014 1026   CALCIUM 9.0 04/18/2014 0931   CALCIUM 9.7 01/17/2011 0946   PROT 6.8 04/18/2014 0931   PROT 6.9 01/17/2011 0946   ALBUMIN 4.2 04/18/2014 0931   ALBUMIN 4.7 01/17/2011 0946   AST 21 04/18/2014 0931   AST 21 01/17/2011 0946   ALT 16 04/18/2014 0931   ALT 17 01/17/2011 0946   ALKPHOS 131 04/18/2014 0931   ALKPHOS 75 01/17/2011 0946   BILITOT 0.44 04/18/2014 0931   BILITOT 0.3 01/17/2011 0946    I No results found for: SPEP  Lab Results  Component Value Date   WBC 2.2* 07/13/2014   NEUTROABS 1.2* 07/13/2014   HGB 12.2 07/13/2014   HCT 37.5 07/13/2014   MCV 82.8 07/13/2014   PLT 163 07/13/2014      Chemistry      Component Value Date/Time   NA 142 04/18/2014 0931   NA 141 01/17/2011 0946   K 4.0 04/18/2014 0931   K 4.3 01/17/2011 0946   CL 101 01/17/2011 0946   CO2 27 04/18/2014 0931   CO2 31 01/17/2011 0946   BUN 12.3 04/18/2014 0931   BUN 19 01/17/2011 0946   CREATININE 0.8 04/18/2014 0931   CREATININE 0.80 04/11/2014 1026      Component Value Date/Time   CALCIUM 9.0 04/18/2014 0931   CALCIUM 9.7 01/17/2011 0946   ALKPHOS 131 04/18/2014 0931   ALKPHOS 75 01/17/2011 0946   AST 21 04/18/2014 0931   AST 21 01/17/2011 0946   ALT 16 04/18/2014 0931   ALT 17 01/17/2011 0946   BILITOT 0.44 04/18/2014 0931   BILITOT 0.3 01/17/2011 0946       Lab Results  Component Value Date   LABCA2 993* 01/11/2014    No  components found for: [id number]  No results for input(s): INR in the last 168 hours.  Urinalysis No results found  for: COLORURINE  STUDIES: No results found.   ASSESSMENT: 69 y.o. [place] woman with stage IV breast cancer (bone metastatic disease)  (1) s/p Right mastectomy 1997 for an estrogen receptor and HER-2 positive breast cancer, treated with  (a) adjuvant chemotherapy according to CALGB 9640 (taxotere x 4 cycles)  (b) high-dose chemotherapy (cyclophosphamide, carboplatin, BCNU) followed by stem cell rescue at [facility] 12/22/1995  (c) biopsy-proven metastatic disease November 2007, estrogen receptor and HER-2 positive  (d) trastuzumab (dose? Duration?)  (e) tamoxifen for 5 years completed 2002  (f) participation in a vaccine study at [facility] 2003 (CEA primed dendritic cells, HER-2 primed dendritic cells)    (2) status post right iliac crest biopsy 10/10/2013 for invasive ductal carcinoma, grade 2, estrogen and progesterone receptor positive  (3) zolendronate started 10/12/2013, to be repeated every 12 weeks  (a) dexa scan 07/28/2013 normal  (4) biopsy of a scalp lesion 10/17/2013 showed metastatic breast cancer, HER-2/neu negative  (5) anastrozole started 10/28/2013  (a) measurable disease = subcutaneous nodules in scalp and LLQ abdomen  (6) neoplasia associated pain  (a) painful blastic lesion left femoral intertrochanteric region: Status post radiation completed November 2015  PLAN: Ronnette Hila to do remarkably well on anastrozole alone. We are continuing that until there is evidence of disease progression. I'm not planning to repeat a PET scan or other scans unless there are some clinical change to evaluate.    she may have had a pneumonia. It is hard to tell. In any case she has already had her antibiotics. I'm going to obtain a chest x-ray more for documentation reasons than any other. We will make sure Dr. Nicki Mooney her primary care physician gets a copy.    she will  receive zolendronate today and again 12 weeks from now.  She will see my 51 assistant and I will see her also at that time.She will see [place] in 12 weeks. I gave her a refill on her dialogue it today and encouraged her to always make sure she has at least a week's supply  To make sure we have time to refill it by mail if necessary.   Overall I am delighted at how well she is doing from a breast cancer point of view. She knows to call for any other issues that may develop before the next visit.    Chauncey Cruel, MD   07/13/2014 9:31 AM

## 2014-07-14 LAB — CANCER ANTIGEN 27.29: CA 27.29: 96 U/mL — ABNORMAL HIGH (ref 0–39)

## 2014-07-20 ENCOUNTER — Telehealth: Payer: Self-pay | Admitting: *Deleted

## 2014-07-20 NOTE — Telephone Encounter (Signed)
TC from patient asking for results of recent CXR (done 07/13/14). Reviewed CXR - no pneumonia per xray report. Informed pt of this. She verbalized relief and understanding. No further needs identified.

## 2014-07-25 DIAGNOSIS — D510 Vitamin B12 deficiency anemia due to intrinsic factor deficiency: Secondary | ICD-10-CM | POA: Diagnosis not present

## 2014-07-31 DIAGNOSIS — K219 Gastro-esophageal reflux disease without esophagitis: Secondary | ICD-10-CM | POA: Diagnosis not present

## 2014-07-31 DIAGNOSIS — R1084 Generalized abdominal pain: Secondary | ICD-10-CM | POA: Diagnosis not present

## 2014-07-31 DIAGNOSIS — J019 Acute sinusitis, unspecified: Secondary | ICD-10-CM | POA: Diagnosis not present

## 2014-07-31 DIAGNOSIS — E039 Hypothyroidism, unspecified: Secondary | ICD-10-CM | POA: Diagnosis not present

## 2014-08-28 DIAGNOSIS — R32 Unspecified urinary incontinence: Secondary | ICD-10-CM | POA: Diagnosis not present

## 2014-08-28 DIAGNOSIS — G4700 Insomnia, unspecified: Secondary | ICD-10-CM | POA: Diagnosis not present

## 2014-08-28 DIAGNOSIS — G8929 Other chronic pain: Secondary | ICD-10-CM | POA: Diagnosis not present

## 2014-08-28 DIAGNOSIS — D649 Anemia, unspecified: Secondary | ICD-10-CM | POA: Diagnosis not present

## 2014-08-30 DIAGNOSIS — D510 Vitamin B12 deficiency anemia due to intrinsic factor deficiency: Secondary | ICD-10-CM | POA: Diagnosis not present

## 2014-09-27 DIAGNOSIS — G4700 Insomnia, unspecified: Secondary | ICD-10-CM | POA: Diagnosis not present

## 2014-09-27 DIAGNOSIS — K219 Gastro-esophageal reflux disease without esophagitis: Secondary | ICD-10-CM | POA: Diagnosis not present

## 2014-09-27 DIAGNOSIS — D649 Anemia, unspecified: Secondary | ICD-10-CM | POA: Diagnosis not present

## 2014-09-27 DIAGNOSIS — D510 Vitamin B12 deficiency anemia due to intrinsic factor deficiency: Secondary | ICD-10-CM | POA: Diagnosis not present

## 2014-09-27 DIAGNOSIS — E569 Vitamin deficiency, unspecified: Secondary | ICD-10-CM | POA: Diagnosis not present

## 2014-10-05 ENCOUNTER — Encounter: Payer: Self-pay | Admitting: Nurse Practitioner

## 2014-10-05 ENCOUNTER — Ambulatory Visit (HOSPITAL_BASED_OUTPATIENT_CLINIC_OR_DEPARTMENT_OTHER): Payer: Medicare Other | Admitting: Nurse Practitioner

## 2014-10-05 ENCOUNTER — Other Ambulatory Visit: Payer: Self-pay | Admitting: *Deleted

## 2014-10-05 ENCOUNTER — Ambulatory Visit (HOSPITAL_BASED_OUTPATIENT_CLINIC_OR_DEPARTMENT_OTHER): Payer: Medicare Other

## 2014-10-05 ENCOUNTER — Other Ambulatory Visit: Payer: Self-pay | Admitting: Nurse Practitioner

## 2014-10-05 ENCOUNTER — Ambulatory Visit (HOSPITAL_COMMUNITY)
Admission: RE | Admit: 2014-10-05 | Discharge: 2014-10-05 | Disposition: A | Discharge status: Home / Self Care | Payer: Medicare Other | Source: Ambulatory Visit | Attending: Nurse Practitioner | Admitting: Nurse Practitioner

## 2014-10-05 ENCOUNTER — Other Ambulatory Visit (HOSPITAL_BASED_OUTPATIENT_CLINIC_OR_DEPARTMENT_OTHER): Payer: Medicare Other

## 2014-10-05 ENCOUNTER — Other Ambulatory Visit: Payer: Self-pay | Admitting: Oncology

## 2014-10-05 VITALS — BP 163/87 | HR 61 | Temp 98.1°F | Resp 18 | Ht 64.0 in | Wt 142.6 lb

## 2014-10-05 DIAGNOSIS — G459 Transient cerebral ischemic attack, unspecified: Secondary | ICD-10-CM | POA: Diagnosis not present

## 2014-10-05 DIAGNOSIS — G893 Neoplasm related pain (acute) (chronic): Secondary | ICD-10-CM

## 2014-10-05 DIAGNOSIS — C7951 Secondary malignant neoplasm of bone: Principal | ICD-10-CM

## 2014-10-05 DIAGNOSIS — C44501 Unspecified malignant neoplasm of skin of breast: Secondary | ICD-10-CM

## 2014-10-05 DIAGNOSIS — C50919 Malignant neoplasm of unspecified site of unspecified female breast: Secondary | ICD-10-CM

## 2014-10-05 DIAGNOSIS — C50912 Malignant neoplasm of unspecified site of left female breast: Secondary | ICD-10-CM

## 2014-10-05 DIAGNOSIS — M25552 Pain in left hip: Secondary | ICD-10-CM | POA: Diagnosis not present

## 2014-10-05 DIAGNOSIS — Z96652 Presence of left artificial knee joint: Secondary | ICD-10-CM | POA: Insufficient documentation

## 2014-10-05 DIAGNOSIS — G458 Other transient cerebral ischemic attacks and related syndromes: Secondary | ICD-10-CM

## 2014-10-05 LAB — COMPREHENSIVE METABOLIC PANEL (CC13)
ALT: 11 U/L (ref 0–55)
AST: 21 U/L (ref 5–34)
Albumin: 4.1 g/dL (ref 3.5–5.0)
Alkaline Phosphatase: 80 U/L (ref 40–150)
Anion Gap: 9 mEq/L (ref 3–11)
BUN: 18 mg/dL (ref 7.0–26.0)
CO2: 27 mEq/L (ref 22–29)
Calcium: 9.3 mg/dL (ref 8.4–10.4)
Chloride: 103 mEq/L (ref 98–109)
Creatinine: 0.8 mg/dL (ref 0.6–1.1)
EGFR: 72 mL/min/{1.73_m2} — ABNORMAL LOW (ref >90)
Glucose: 72 mg/dl (ref 70–140)
Potassium: 4.1 mEq/L (ref 3.5–5.1)
Sodium: 139 mEq/L (ref 136–145)
Total Bilirubin: 0.35 mg/dL (ref 0.20–1.20)
Total Protein: 6.9 g/dL (ref 6.4–8.3)

## 2014-10-05 LAB — CBC WITH DIFFERENTIAL/PLATELET
BASO%: 0.9 % (ref 0.0–2.0)
Basophils Absolute: 0 10*3/uL (ref 0.0–0.1)
EOS%: 2.1 % (ref 0.0–7.0)
Eosinophils Absolute: 0.1 10*3/uL (ref 0.0–0.5)
HCT: 40.4 % (ref 34.8–46.6)
HGB: 13.1 g/dL (ref 11.6–15.9)
LYMPH%: 24.3 % (ref 14.0–49.7)
MCH: 26.6 pg (ref 25.1–34.0)
MCHC: 32.5 g/dL (ref 31.5–36.0)
MCV: 81.9 fL (ref 79.5–101.0)
MONO#: 0.6 10*3/uL (ref 0.1–0.9)
MONO%: 12 % (ref 0.0–14.0)
NEUT#: 3.1 10*3/uL (ref 1.5–6.5)
NEUT%: 60.7 % (ref 38.4–76.8)
Platelets: 218 10*3/uL (ref 145–400)
RBC: 4.94 10*6/uL (ref 3.70–5.45)
RDW: 16.6 % — ABNORMAL HIGH (ref 11.2–14.5)
WBC: 5 10*3/uL (ref 3.9–10.3)
lymph#: 1.2 10*3/uL (ref 0.9–3.3)

## 2014-10-05 LAB — CANCER ANTIGEN 27.29: CA 27.29: 54 U/mL — ABNORMAL HIGH (ref 0–39)

## 2014-10-05 MED ORDER — ZOLEDRONIC ACID 4 MG/100ML IV SOLN
4.0000 mg | Freq: Once | INTRAVENOUS | Status: AC
  Administered 2014-10-05: 4 mg via INTRAVENOUS
  Filled 2014-10-05: qty 100

## 2014-10-05 MED ORDER — SODIUM CHLORIDE 0.9 % IV SOLN
Freq: Once | INTRAVENOUS | Status: AC
  Administered 2014-10-05: 12:00:00 via INTRAVENOUS

## 2014-10-05 MED ORDER — ANASTROZOLE 1 MG PO TABS: 1.0000 mg | ORAL_TABLET | Freq: Every day | ORAL | Status: DC

## 2014-10-05 NOTE — Progress Notes (Signed)
[place]  [phone] [phone]     ID: Jade Mooney DOB: 1945/12/16  MR#: [id number]  JYN#:[id number]  PCP: Ann Held, MD GYN: Newton Pigg MD SU:  OTHER MD: Clarene Essex MD, Leslie Andrea M.D., Gatha Mayer MD  CHIEF COMPLAINT: Newly diagnosed metastatic breast cancer  CURRENT TREATMENT: Zolendronate, anastrozole   BREAST CANCER HISTORY: From the earlier summary notes:  Jade Mooney was referred to Dr. Clarene Essex for evaluation of abdominal discomfort and nausea. Exam and blood work including liver function tests was unremarkable aside from normochromic normocytic anemia. Cholecystitis was suspected and the patient underwent endoscopy followed by abdominal/ pelvic CT scan on 09/29/2013. This showed showed a normal gallbladder. Incidental findings included multiple simple cysts in the liver and aortic atherosclerosis. However mixed lytic and sclerotic bony lesions were noted. On 10/10/2013 the patient underwent biopsy of the right iliac bone, and this showed ([id number]) metastatic invasive ductal carcinoma, grade 2, estrogen and progesterone receptor strongly positive.  Jade Mooney has a remote history of breast cancer, which we tried to reconstruct. She was originally diagnosed early in 1997 with stage III disease, and underwent right mastectomy followed by adjuvant chemotherapy. She then participated in a [facility] protocol with high-dose chemotherapy followed by stem cell rescue 12/22/1995. Unfortunately later that year liver lesions were noted and were biopsy-proven to be metastatic breast cancer. This was not felt to be resectable. However the tumor was estrogen receptor positive and HER-2 positive. The patient was treated with Herceptin (she does not know for what period of time) and was then on tamoxifen until November of 2002. She also participated in a vaccine study at [facility].  Jade Mooney's subsequent history is as detailed below  INTERVAL  HISTORY: Jade Mooney returns today for followup of her metastatic breast cancer, accompanied by her husband [name].  She is due for zometa today and also continues on anastrozole daily. The zometa makes her nauseous for a day or 2 afterwards, but otherwise she tolerates this well. She has some mild hot flashes, but denies any vaginal changes from the anastrozole. The interval history is remarkable for more left femur/hip pain, which in turn seems to make her knee hurt more. She uses $Remove'4mg'lCPdUXo$  dilaudid nightly to help get comfortable enough to sleep.  REVIEW OF SYSTEMS: Jade Mooney denies fevers, chills, or changes in bowel or bladder habits. She uses miralax daily PRN to ward off constipation. Her appetite is fair, but her energy level lately has been poor. She denies shortness of breath, chest pain, cough, or palpitations. She has been clenching her jaw at night and can wake up wit jaw pain, but she visited her dentist last month, and her teeth are in good repair. She denies headaches, dizziness, or vision changes. A detailed review of systems is otherwise stable.  PAST MEDICAL HISTORY: Past Medical History  Diagnosis Date  . Cancer     Breast 1997  . TIA (transient ischemic attack)   . Hypothyroidism   . Hypercholesterolemia   . Osteoarthritis   . GERD (gastroesophageal reflux disease)     PAST SURGICAL HISTORY: Past Surgical History  Procedure Laterality Date  . Mastectomy modified radical Right   . Cataract extraction, bilateral    . Total knee arthroplasty Left   . Tonsillectomy    . Tubal ligation      FAMILY HISTORY No family history on file. The patient's father died at the age of 41 from heart disease. The patient's mother died at the age of 37. The  patient had no brothers, one sister. One of the patient's mother's 5 sisters was diagnosed with breast cancer in her 67s  GYNECOLOGIC HISTORY:  No LMP recorded. Patient is postmenopausal. Menarche age 42, the patient is GX P0. She stopped having  periods approximately 1994. She used hormone replacement until 1997. 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 Her husband Jade Mooney (goes by "Jade Mooney" was [employer]. of information all services at [employer]. They live alone, with no pets.    ADVANCED DIRECTIVES: In place   HEALTH MAINTENANCE: History  Substance Use Topics  . Smoking status: Never Smoker   . Smokeless tobacco: Not on file  . Alcohol Use: Not on file     Colonoscopy: 2012  PAP: 2010  Bone density: 2012  Lipid panel:  Allergies  Allergen Reactions  . Amoxicillin Hives  . Percocet [Oxycodone-Acetaminophen] Nausea And Vomiting  . Percodan [Oxycodone-Aspirin] Nausea And Vomiting    Current Outpatient Prescriptions  Medication Sig Dispense Refill  . acetaminophen (TYLENOL) 500 MG tablet Take 1,000 mg by mouth every 6 (six) hours as needed for mild pain.    . Ascorbic Acid (VITAMIN C) 1000 MG tablet Take 1,000 mg by mouth daily.    Marland Kitchen aspirin EC 325 MG tablet Take 325 mg by mouth daily.    . beta carotene 15 MG capsule Take 15 mg by mouth daily.    . calcium carbonate (OS-CAL) 600 MG TABS tablet Take 600 mg by mouth 2 (two) times daily with a meal.    . Calcium Carbonate-Vitamin D (CALCIUM + D PO) Take 1 tablet by mouth 2 (two) times daily.    . Cholecalciferol 1000 UNITS tablet Take 1,000 Units by mouth daily.    . fluticasone (FLONASE) 50 MCG/ACT nasal spray Place 1 spray into both nostrils daily.    Marland Kitchen HYDROmorphone (DILAUDID) 4 MG tablet Take 1 tablet (4 mg total) by mouth every 6 (six) hours as needed for severe pain. 120 tablet 0  . ketotifen (ALAWAY) 0.025 % ophthalmic solution Place 1 drop into both eyes 2 (two) times daily.    Marland Kitchen levothyroxine (SYNTHROID, LEVOTHROID) 88 MCG tablet Take 88 mcg by mouth daily before breakfast.    . loratadine (CLARITIN) 10 MG tablet Take 10 mg by mouth daily.    . Lutein 6 MG TABS  Take 6 mg by mouth daily.    Marland Kitchen METRONIDAZOLE, TOPICAL, 0.75 % LOTN Apply 1 application topically daily.    . Multiple Vitamins-Minerals (MULTIVITAMIN WITH MINERALS) tablet Take 1 tablet by mouth daily.    . pantoprazole (PROTONIX) 40 MG tablet Take 40 mg by mouth 2 (two) times daily.    Vladimir Faster Glycol-Propyl Glycol (SYSTANE OP) Apply 1 drop to eye 3 (three) times daily.    . polyethylene glycol (MIRALAX / GLYCOLAX) packet Take 17 g by mouth daily.    . simvastatin (ZOCOR) 40 MG tablet Take 40 mg by mouth daily.    . vitamin E (VITAMIN E) 400 UNIT capsule Take 400 Units by mouth daily.    Marland Kitchen anastrozole (ARIMIDEX) 1 MG tablet Take 1 tablet (1 mg total) by mouth daily. 90 tablet 3  . LORazepam (ATIVAN) 0.5 MG tablet Use for symptoms associated with procedures/scans. Take one tab 30 minutes prior to procedure and repeat if needed. (Patient not taking: Reported on 10/05/2014) 10 tablet 0  . PROCTOSOL HC 2.5 % rectal cream  No current facility-administered medications for this visit.    OBJECTIVE: Middle-aged white woman  In no acute distress Filed Vitals:   10/05/14 0937  BP: 163/87  Pulse: 61  Temp: 98.1 F (36.7 C)  Resp: 18     Body mass index is 24.47 kg/(m^2).    ECOG FS:1 - Symptomatic but completely ambulatory  Skin: warm, dry  HEENT: sclerae anicteric, conjunctivae pink, oropharynx clear. No thrush or mucositis.  Lymph Nodes: No cervical or supraclavicular lymphadenopathy  Lungs: clear to auscultation bilaterally, no rales, wheezes, or rhonci  Heart: regular rate and rhythm  Abdomen: round, soft, non tender, positive bowel sounds  Musculoskeletal: No focal spinal tenderness, no peripheral edema, gait disturbance on left side, patient ambulating with cane.  Neuro: non focal, well oriented, positive affect  Breasts: right breast status post mastectomy. No evidence of chest wall recurrence. Right axilla benign. Left breast unremarkable.   LAB RESULTS: CMP     Component  Value Date/Time   NA 139 10/05/2014 0918   NA 141 01/17/2011 0946   K 4.1 10/05/2014 0918   K 4.3 01/17/2011 0946   CL 101 01/17/2011 0946   CO2 27 10/05/2014 0918   CO2 31 01/17/2011 0946   GLUCOSE 72 10/05/2014 0918   GLUCOSE 68* 01/17/2011 0946   BUN 18.0 10/05/2014 0918   BUN 19 01/17/2011 0946   CREATININE 0.8 10/05/2014 0918   CREATININE 0.80 04/11/2014 1026   CALCIUM 9.3 10/05/2014 0918   CALCIUM 9.7 01/17/2011 0946   PROT 6.9 10/05/2014 0918   PROT 6.9 01/17/2011 0946   ALBUMIN 4.1 10/05/2014 0918   ALBUMIN 4.7 01/17/2011 0946   AST 21 10/05/2014 0918   AST 21 01/17/2011 0946   ALT 11 10/05/2014 0918   ALT 17 01/17/2011 0946   ALKPHOS 80 10/05/2014 0918   ALKPHOS 75 01/17/2011 0946   BILITOT 0.35 10/05/2014 0918   BILITOT 0.3 01/17/2011 0946    I No results found for: SPEP  Lab Results  Component Value Date   WBC 5.0 10/05/2014   NEUTROABS 3.1 10/05/2014   HGB 13.1 10/05/2014   HCT 40.4 10/05/2014   MCV 81.9 10/05/2014   PLT 218 10/05/2014      Chemistry      Component Value Date/Time   NA 139 10/05/2014 0918   NA 141 01/17/2011 0946   K 4.1 10/05/2014 0918   K 4.3 01/17/2011 0946   CL 101 01/17/2011 0946   CO2 27 10/05/2014 0918   CO2 31 01/17/2011 0946   BUN 18.0 10/05/2014 0918   BUN 19 01/17/2011 0946   CREATININE 0.8 10/05/2014 0918   CREATININE 0.80 04/11/2014 1026      Component Value Date/Time   CALCIUM 9.3 10/05/2014 0918   CALCIUM 9.7 01/17/2011 0946   ALKPHOS 80 10/05/2014 0918   ALKPHOS 75 01/17/2011 0946   AST 21 10/05/2014 0918   AST 21 01/17/2011 0946   ALT 11 10/05/2014 0918   ALT 17 01/17/2011 0946   BILITOT 0.35 10/05/2014 0918   BILITOT 0.3 01/17/2011 0946     Results for SHATERRIA, SAGER (MRN [id number]) as of 10/05/2014 17:12  Ref. Range 01/11/2014 09:54 07/13/2014 08:56 10/05/2014 09:18  CA 27.29 Latest Ref Range: 0-39 U/mL 993 (H) 96 (H) 54 (H)   No components found for: LABCA125  No results for input(s): INR in  the last 168 hours.  Urinalysis No results found for: COLORURINE  STUDIES: Dg Knee Complete 4 Views Left  10/05/2014  CLINICAL DATA:  Left knee pain.  Metastatic breast cancer to bone.  EXAM: LEFT KNEE - COMPLETE 4+ VIEW  COMPARISON:  01/25/2014  FINDINGS: There is increased osteopenia diffusely. The components of the total knee prosthesis appear in good position. No joint effusion. No bone destruction.  There is a mottled appearance of the distal femur and there is a subtle sclerotic lesion in the proximal fibula with a mottled appearance in the proximal tibia. Those all probably represent metastatic disease.  IMPRESSION: Sclerotic metastatic disease to the femur, tibia, and fibula. No pathologic fractures.   Electronically Signed   By: Lorriane Shire M.D.   On: 10/05/2014 14:04   Dg Femur Min 2 Views Left  10/05/2014   CLINICAL DATA:  Metastatic breast cancer.  EXAM: LEFT FEMUR 2 VIEWS  COMPARISON:  PET-CT 04/11/2014.  FINDINGS: Mild mottled lucencies noted throughout the left femur. This could be related to metastatic disease. No prominent lytic or blastic lesion identified. There is no evidence of fracture or dislocation. Left knee replacement with good anatomic alignment.  IMPRESSION: 1. Diffuse mild mottled lucency noted throughout the left femur. Metastatic disease cannot be excluded. No focal prominent lytic lesion or blastic lesion identified. No evidence of fracture.  2. Total left knee replacement with good anatomic alignment.   Electronically Signed   By: Jade Mooney  Register   On: 10/05/2014 14:03     ASSESSMENT: 69 y.o. [place] woman with stage IV breast cancer (bone metastatic disease)  (1) s/p Right mastectomy 1997 for an estrogen receptor and HER-2 positive breast cancer, treated with  (a) adjuvant chemotherapy according to CALGB 9640 (taxotere x 4 cycles)  (b) high-dose chemotherapy (cyclophosphamide, carboplatin, BCNU) followed by stem cell rescue at [facility] 12/22/1995  (c)  biopsy-proven metastatic disease November 2007, estrogen receptor and HER-2 positive  (d) trastuzumab (dose? Duration?)  (e) tamoxifen for 5 years completed 2002  (f) participation in a vaccine study at [facility] 2003 (CEA primed dendritic cells, HER-2 primed dendritic cells)    (2) status post right iliac crest biopsy 10/10/2013 for invasive ductal carcinoma, grade 2, estrogen and progesterone receptor positive  (3) zolendronate started 10/12/2013, to be repeated every 12 weeks  (a) dexa scan 07/28/2013 normal  (4) biopsy of a scalp lesion 10/17/2013 showed metastatic breast cancer, HER-2/neu negative  (5) anastrozole started 10/28/2013  (a) measurable disease = subcutaneous nodules in scalp and LLQ abdomen  (6) neoplasia associated pain  (a) painful blastic lesion left femoral intertrochanteric region: Status post radiation completed November 2015  PLAN: Jacie is tolerating the anastrozole well and her CA 27.29 continues to trend downwards, to 54 today. She will continue this indefinitely until there is evidence of recurrent disease. We will check a plain film of the head of her left femur as well as her left knee now, and follow up with a PET in 3 months. We advised she follow up with Dr. Theda Sers as she may be approaching time to replace this knee again. Her first was almost 10 years ago. The rest of the labs were reviewed in detail and were entirely stable. She will proceed with zometa as planned today.   Eastyn will return in October for more labs and a follow up visit with Dr. Jana Hakim. She understands and agrees with this plan. She knows the goal of treatment in her case is control. She has been encouraged to call with any issues that might arise before her next visit here.   Laurie Panda, NP   10/05/2014  2:15 PM    ADDENDUM:  I called [facility] to discuss the results of her left knee films.  CLINICAL DATA: Left knee pain. Metastatic breast cancer to bone.  EXAM: LEFT KNEE -  COMPLETE 4+ VIEW  COMPARISON: 01/25/2014  FINDINGS: There is increased osteopenia diffusely. The components of the total knee prosthesis appear in good position. No joint effusion. No bone destruction.  There is a mottled appearance of the distal femur and there is a subtle sclerotic lesion in the proximal fibula with a mottled appearance in the proximal tibia. Those all probably represent metastatic disease.  IMPRESSION: Sclerotic metastatic disease to the femur, tibia, and fibula. No pathologic fractures.   Electronically Signed  By: Lorriane Shire M.D.  On: 10/05/2014 14:04  I think the pain she is feeling may be related to the lesions we are seeing in the distal femur or and proximal fibula and tibia. Perhaps radiation to this area may take care of the pain. I suggested she give Dr. Orlene Erm a call.  Otherwise I am very pleased with the way she is doing in general. We're going to continue the current treatment and repeat a PET scan later this year after she sees me in October  I personally saw this patient and performed a substantive portion of this encounter with the listed APP documented above.   Chauncey Cruel, MD Medical Oncology and Hematology [facility] [address] [place], [place] [address] Tel. [phone]    Fax. [phone]

## 2014-10-05 NOTE — Patient Instructions (Signed)

## 2014-10-06 ENCOUNTER — Other Ambulatory Visit: Payer: Self-pay | Admitting: Nurse Practitioner

## 2014-10-19 DIAGNOSIS — C50919 Malignant neoplasm of unspecified site of unspecified female breast: Secondary | ICD-10-CM | POA: Diagnosis not present

## 2014-10-19 DIAGNOSIS — C7951 Secondary malignant neoplasm of bone: Secondary | ICD-10-CM | POA: Diagnosis not present

## 2014-10-27 DIAGNOSIS — D510 Vitamin B12 deficiency anemia due to intrinsic factor deficiency: Secondary | ICD-10-CM | POA: Diagnosis not present

## 2014-10-28 DIAGNOSIS — G8929 Other chronic pain: Secondary | ICD-10-CM | POA: Diagnosis not present

## 2014-10-28 DIAGNOSIS — G459 Transient cerebral ischemic attack, unspecified: Secondary | ICD-10-CM | POA: Diagnosis not present

## 2014-10-28 DIAGNOSIS — J309 Allergic rhinitis, unspecified: Secondary | ICD-10-CM | POA: Diagnosis not present

## 2014-10-28 DIAGNOSIS — R32 Unspecified urinary incontinence: Secondary | ICD-10-CM | POA: Diagnosis not present

## 2014-10-30 DIAGNOSIS — C7951 Secondary malignant neoplasm of bone: Secondary | ICD-10-CM | POA: Diagnosis not present

## 2014-10-30 DIAGNOSIS — C50919 Malignant neoplasm of unspecified site of unspecified female breast: Secondary | ICD-10-CM | POA: Diagnosis not present

## 2014-10-30 DIAGNOSIS — M25562 Pain in left knee: Secondary | ICD-10-CM | POA: Diagnosis not present

## 2014-11-03 DIAGNOSIS — D492 Neoplasm of unspecified behavior of bone, soft tissue, and skin: Secondary | ICD-10-CM | POA: Diagnosis not present

## 2014-11-03 DIAGNOSIS — L0201 Cutaneous abscess of face: Secondary | ICD-10-CM | POA: Diagnosis not present

## 2014-11-07 DIAGNOSIS — Z510 Encounter for antineoplastic radiation therapy: Secondary | ICD-10-CM | POA: Diagnosis not present

## 2014-11-07 DIAGNOSIS — C50919 Malignant neoplasm of unspecified site of unspecified female breast: Secondary | ICD-10-CM | POA: Diagnosis not present

## 2014-11-07 DIAGNOSIS — C7951 Secondary malignant neoplasm of bone: Secondary | ICD-10-CM | POA: Diagnosis not present

## 2014-11-09 DIAGNOSIS — C50919 Malignant neoplasm of unspecified site of unspecified female breast: Secondary | ICD-10-CM | POA: Diagnosis not present

## 2014-11-09 DIAGNOSIS — C7951 Secondary malignant neoplasm of bone: Secondary | ICD-10-CM | POA: Diagnosis not present

## 2014-11-10 DIAGNOSIS — L821 Other seborrheic keratosis: Secondary | ICD-10-CM | POA: Diagnosis not present

## 2014-11-10 DIAGNOSIS — D216 Benign neoplasm of connective and other soft tissue of trunk, unspecified: Secondary | ICD-10-CM | POA: Diagnosis not present

## 2014-11-10 DIAGNOSIS — C7951 Secondary malignant neoplasm of bone: Secondary | ICD-10-CM | POA: Diagnosis not present

## 2014-11-10 DIAGNOSIS — Z0000 Encounter for general adult medical examination without abnormal findings: Secondary | ICD-10-CM | POA: Diagnosis not present

## 2014-11-10 DIAGNOSIS — D210 Benign neoplasm of connective and other soft tissue of head, face and neck: Secondary | ICD-10-CM | POA: Diagnosis not present

## 2014-11-13 DIAGNOSIS — Z510 Encounter for antineoplastic radiation therapy: Secondary | ICD-10-CM | POA: Diagnosis not present

## 2014-11-13 DIAGNOSIS — C50919 Malignant neoplasm of unspecified site of unspecified female breast: Secondary | ICD-10-CM | POA: Diagnosis not present

## 2014-11-13 DIAGNOSIS — C7951 Secondary malignant neoplasm of bone: Secondary | ICD-10-CM | POA: Diagnosis not present

## 2014-11-14 DIAGNOSIS — Z510 Encounter for antineoplastic radiation therapy: Secondary | ICD-10-CM | POA: Diagnosis not present

## 2014-11-14 DIAGNOSIS — C7951 Secondary malignant neoplasm of bone: Secondary | ICD-10-CM | POA: Diagnosis not present

## 2014-11-14 DIAGNOSIS — C50919 Malignant neoplasm of unspecified site of unspecified female breast: Secondary | ICD-10-CM | POA: Diagnosis not present

## 2014-11-15 DIAGNOSIS — C50919 Malignant neoplasm of unspecified site of unspecified female breast: Secondary | ICD-10-CM | POA: Diagnosis not present

## 2014-11-15 DIAGNOSIS — C7951 Secondary malignant neoplasm of bone: Secondary | ICD-10-CM | POA: Diagnosis not present

## 2014-11-15 DIAGNOSIS — Z510 Encounter for antineoplastic radiation therapy: Secondary | ICD-10-CM | POA: Diagnosis not present

## 2014-11-16 DIAGNOSIS — C50919 Malignant neoplasm of unspecified site of unspecified female breast: Secondary | ICD-10-CM | POA: Diagnosis not present

## 2014-11-16 DIAGNOSIS — Z510 Encounter for antineoplastic radiation therapy: Secondary | ICD-10-CM | POA: Diagnosis not present

## 2014-11-16 DIAGNOSIS — C7951 Secondary malignant neoplasm of bone: Secondary | ICD-10-CM | POA: Diagnosis not present

## 2014-11-17 DIAGNOSIS — Z510 Encounter for antineoplastic radiation therapy: Secondary | ICD-10-CM | POA: Diagnosis not present

## 2014-11-17 DIAGNOSIS — C7951 Secondary malignant neoplasm of bone: Secondary | ICD-10-CM | POA: Diagnosis not present

## 2014-11-17 DIAGNOSIS — C50919 Malignant neoplasm of unspecified site of unspecified female breast: Secondary | ICD-10-CM | POA: Diagnosis not present

## 2014-11-20 DIAGNOSIS — Z510 Encounter for antineoplastic radiation therapy: Secondary | ICD-10-CM | POA: Diagnosis not present

## 2014-11-20 DIAGNOSIS — C50919 Malignant neoplasm of unspecified site of unspecified female breast: Secondary | ICD-10-CM | POA: Diagnosis not present

## 2014-11-20 DIAGNOSIS — C7951 Secondary malignant neoplasm of bone: Secondary | ICD-10-CM | POA: Diagnosis not present

## 2014-11-21 DIAGNOSIS — C7951 Secondary malignant neoplasm of bone: Secondary | ICD-10-CM | POA: Diagnosis not present

## 2014-11-21 DIAGNOSIS — C50919 Malignant neoplasm of unspecified site of unspecified female breast: Secondary | ICD-10-CM | POA: Diagnosis not present

## 2014-11-21 DIAGNOSIS — Z510 Encounter for antineoplastic radiation therapy: Secondary | ICD-10-CM | POA: Diagnosis not present

## 2014-11-22 DIAGNOSIS — Z510 Encounter for antineoplastic radiation therapy: Secondary | ICD-10-CM | POA: Diagnosis not present

## 2014-11-22 DIAGNOSIS — C50919 Malignant neoplasm of unspecified site of unspecified female breast: Secondary | ICD-10-CM | POA: Diagnosis not present

## 2014-11-22 DIAGNOSIS — C7951 Secondary malignant neoplasm of bone: Secondary | ICD-10-CM | POA: Diagnosis not present

## 2014-11-23 DIAGNOSIS — Z510 Encounter for antineoplastic radiation therapy: Secondary | ICD-10-CM | POA: Diagnosis not present

## 2014-11-23 DIAGNOSIS — C7951 Secondary malignant neoplasm of bone: Secondary | ICD-10-CM | POA: Diagnosis not present

## 2014-11-23 DIAGNOSIS — C50919 Malignant neoplasm of unspecified site of unspecified female breast: Secondary | ICD-10-CM | POA: Diagnosis not present

## 2014-11-24 DIAGNOSIS — C7951 Secondary malignant neoplasm of bone: Secondary | ICD-10-CM | POA: Diagnosis not present

## 2014-11-24 DIAGNOSIS — Z510 Encounter for antineoplastic radiation therapy: Secondary | ICD-10-CM | POA: Diagnosis not present

## 2014-11-24 DIAGNOSIS — C50919 Malignant neoplasm of unspecified site of unspecified female breast: Secondary | ICD-10-CM | POA: Diagnosis not present

## 2014-11-28 DIAGNOSIS — C7951 Secondary malignant neoplasm of bone: Secondary | ICD-10-CM | POA: Diagnosis not present

## 2014-11-28 DIAGNOSIS — C50919 Malignant neoplasm of unspecified site of unspecified female breast: Secondary | ICD-10-CM | POA: Diagnosis not present

## 2014-11-28 DIAGNOSIS — Z510 Encounter for antineoplastic radiation therapy: Secondary | ICD-10-CM | POA: Diagnosis not present

## 2014-11-28 DIAGNOSIS — D510 Vitamin B12 deficiency anemia due to intrinsic factor deficiency: Secondary | ICD-10-CM | POA: Diagnosis not present

## 2014-11-29 DIAGNOSIS — Z510 Encounter for antineoplastic radiation therapy: Secondary | ICD-10-CM | POA: Diagnosis not present

## 2014-11-29 DIAGNOSIS — C50919 Malignant neoplasm of unspecified site of unspecified female breast: Secondary | ICD-10-CM | POA: Diagnosis not present

## 2014-11-29 DIAGNOSIS — C7951 Secondary malignant neoplasm of bone: Secondary | ICD-10-CM | POA: Diagnosis not present

## 2014-11-30 DIAGNOSIS — C7951 Secondary malignant neoplasm of bone: Secondary | ICD-10-CM | POA: Diagnosis not present

## 2014-11-30 DIAGNOSIS — Z510 Encounter for antineoplastic radiation therapy: Secondary | ICD-10-CM | POA: Diagnosis not present

## 2014-11-30 DIAGNOSIS — C50919 Malignant neoplasm of unspecified site of unspecified female breast: Secondary | ICD-10-CM | POA: Diagnosis not present

## 2014-12-01 DIAGNOSIS — C50919 Malignant neoplasm of unspecified site of unspecified female breast: Secondary | ICD-10-CM | POA: Diagnosis not present

## 2014-12-01 DIAGNOSIS — C7951 Secondary malignant neoplasm of bone: Secondary | ICD-10-CM | POA: Diagnosis not present

## 2014-12-01 DIAGNOSIS — Z510 Encounter for antineoplastic radiation therapy: Secondary | ICD-10-CM | POA: Diagnosis not present

## 2014-12-11 DIAGNOSIS — H3531 Nonexudative age-related macular degeneration: Secondary | ICD-10-CM | POA: Diagnosis not present

## 2014-12-11 DIAGNOSIS — H524 Presbyopia: Secondary | ICD-10-CM | POA: Diagnosis not present

## 2014-12-14 ENCOUNTER — Other Ambulatory Visit: Payer: Self-pay

## 2014-12-14 DIAGNOSIS — Z1231 Encounter for screening mammogram for malignant neoplasm of breast: Secondary | ICD-10-CM

## 2014-12-25 DIAGNOSIS — Z23 Encounter for immunization: Secondary | ICD-10-CM | POA: Diagnosis not present

## 2014-12-27 ENCOUNTER — Other Ambulatory Visit: Payer: Self-pay

## 2014-12-27 DIAGNOSIS — C50919 Malignant neoplasm of unspecified site of unspecified female breast: Secondary | ICD-10-CM

## 2014-12-27 DIAGNOSIS — C7951 Secondary malignant neoplasm of bone: Principal | ICD-10-CM

## 2014-12-28 ENCOUNTER — Encounter (HOSPITAL_COMMUNITY)
Admission: RE | Admit: 2014-12-28 | Discharge: 2014-12-28 | Disposition: A | Discharge status: Home / Self Care | Payer: Medicare Other | Source: Ambulatory Visit | Attending: Oncology | Admitting: Oncology

## 2014-12-28 ENCOUNTER — Ambulatory Visit (HOSPITAL_COMMUNITY): Payer: Medicare Other

## 2014-12-28 DIAGNOSIS — C7951 Secondary malignant neoplasm of bone: Secondary | ICD-10-CM | POA: Insufficient documentation

## 2014-12-28 DIAGNOSIS — C50919 Malignant neoplasm of unspecified site of unspecified female breast: Secondary | ICD-10-CM | POA: Diagnosis not present

## 2014-12-28 LAB — GLUCOSE, CAPILLARY: Glucose-Capillary: 94 mg/dL (ref 65–99)

## 2014-12-28 MED ORDER — FLUDEOXYGLUCOSE F - 18 (FDG) INJECTION
7.0500 | Freq: Once | INTRAVENOUS | Status: DC | PRN
  Administered 2014-12-28: 7.05 via INTRAVENOUS
  Filled 2014-12-28: qty 7.05

## 2015-01-01 ENCOUNTER — Telehealth: Payer: Self-pay | Admitting: Oncology

## 2015-01-01 ENCOUNTER — Ambulatory Visit (HOSPITAL_BASED_OUTPATIENT_CLINIC_OR_DEPARTMENT_OTHER): Payer: Medicare Other

## 2015-01-01 ENCOUNTER — Other Ambulatory Visit: Payer: Self-pay | Admitting: Oncology

## 2015-01-01 ENCOUNTER — Ambulatory Visit (HOSPITAL_BASED_OUTPATIENT_CLINIC_OR_DEPARTMENT_OTHER): Payer: Medicare Other | Admitting: Oncology

## 2015-01-01 ENCOUNTER — Other Ambulatory Visit (HOSPITAL_BASED_OUTPATIENT_CLINIC_OR_DEPARTMENT_OTHER): Payer: Medicare Other

## 2015-01-01 VITALS — BP 151/73 | HR 58 | Temp 97.8°F | Resp 18 | Ht 64.0 in | Wt 147.1 lb

## 2015-01-01 DIAGNOSIS — E069 Thyroiditis, unspecified: Secondary | ICD-10-CM | POA: Diagnosis not present

## 2015-01-01 DIAGNOSIS — G893 Neoplasm related pain (acute) (chronic): Secondary | ICD-10-CM | POA: Diagnosis not present

## 2015-01-01 DIAGNOSIS — C7951 Secondary malignant neoplasm of bone: Secondary | ICD-10-CM

## 2015-01-01 DIAGNOSIS — C50919 Malignant neoplasm of unspecified site of unspecified female breast: Secondary | ICD-10-CM

## 2015-01-01 DIAGNOSIS — C44501 Unspecified malignant neoplasm of skin of breast: Secondary | ICD-10-CM | POA: Diagnosis not present

## 2015-01-01 DIAGNOSIS — G459 Transient cerebral ischemic attack, unspecified: Secondary | ICD-10-CM

## 2015-01-01 DIAGNOSIS — R200 Anesthesia of skin: Secondary | ICD-10-CM

## 2015-01-01 DIAGNOSIS — C50912 Malignant neoplasm of unspecified site of left female breast: Secondary | ICD-10-CM

## 2015-01-01 DIAGNOSIS — K5900 Constipation, unspecified: Secondary | ICD-10-CM

## 2015-01-01 DIAGNOSIS — G458 Other transient cerebral ischemic attacks and related syndromes: Secondary | ICD-10-CM

## 2015-01-01 LAB — CBC WITH DIFFERENTIAL/PLATELET
BASO%: 0.9 % (ref 0.0–2.0)
Basophils Absolute: 0 10*3/uL (ref 0.0–0.1)
EOS%: 2.8 % (ref 0.0–7.0)
Eosinophils Absolute: 0.1 10*3/uL (ref 0.0–0.5)
HCT: 40.7 % (ref 34.8–46.6)
HGB: 13.1 g/dL (ref 11.6–15.9)
LYMPH%: 18.2 % (ref 14.0–49.7)
MCH: 26.9 pg (ref 25.1–34.0)
MCHC: 32.3 g/dL (ref 31.5–36.0)
MCV: 83.3 fL (ref 79.5–101.0)
MONO#: 0.3 10*3/uL (ref 0.1–0.9)
MONO%: 8.9 % (ref 0.0–14.0)
NEUT#: 2.7 10*3/uL (ref 1.5–6.5)
NEUT%: 69.2 % (ref 38.4–76.8)
Platelets: 202 10*3/uL (ref 145–400)
RBC: 4.88 10*6/uL (ref 3.70–5.45)
RDW: 15.5 % — ABNORMAL HIGH (ref 11.2–14.5)
WBC: 3.9 10*3/uL (ref 3.9–10.3)
lymph#: 0.7 10*3/uL — ABNORMAL LOW (ref 0.9–3.3)

## 2015-01-01 LAB — COMPREHENSIVE METABOLIC PANEL (CC13)
ALT: 14 U/L (ref 0–55)
AST: 20 U/L (ref 5–34)
Albumin: 4.1 g/dL (ref 3.5–5.0)
Alkaline Phosphatase: 65 U/L (ref 40–150)
Anion Gap: 10 mEq/L (ref 3–11)
BUN: 12.3 mg/dL (ref 7.0–26.0)
CO2: 26 mEq/L (ref 22–29)
Calcium: 9.2 mg/dL (ref 8.4–10.4)
Chloride: 106 mEq/L (ref 98–109)
Creatinine: 0.8 mg/dL (ref 0.6–1.1)
EGFR: 79 mL/min/{1.73_m2} — ABNORMAL LOW (ref >90)
Glucose: 95 mg/dl (ref 70–140)
Potassium: 3.9 mEq/L (ref 3.5–5.1)
Sodium: 142 mEq/L (ref 136–145)
Total Bilirubin: 0.47 mg/dL (ref 0.20–1.20)
Total Protein: 6.5 g/dL (ref 6.4–8.3)

## 2015-01-01 LAB — TSH CHCC: TSH: 3.256 m(IU)/L (ref 0.308–3.960)

## 2015-01-01 MED ORDER — SODIUM CHLORIDE 0.9 % IV SOLN
Freq: Once | INTRAVENOUS | Status: AC
  Administered 2015-01-01: 09:00:00 via INTRAVENOUS

## 2015-01-01 MED ORDER — ZOLEDRONIC ACID 4 MG/100ML IV SOLN
4.0000 mg | Freq: Once | INTRAVENOUS | Status: AC
  Administered 2015-01-01: 4 mg via INTRAVENOUS
  Filled 2015-01-01: qty 100

## 2015-01-01 NOTE — Progress Notes (Signed)
[facility]  [phone] Fax:(336) [phone]     ID: AMELIANA BRASHEAR DOB: [birth year]  MR#: [id number]  YYT#:[id number]  PCP: Ann Held, MD GYN: Newton Pigg MD SU:  OTHER MD: Clarene Essex MD, Leslie Andrea M.D., Gatha Mayer MD  CHIEF COMPLAINT: Newly diagnosed metastatic breast cancer  CURRENT TREATMENT: Zolendronate, anastrozole   BREAST CANCER HISTORY: From the earlier summary notes:  Jade Mooney was referred to Dr. Clarene Essex for evaluation of abdominal discomfort and nausea. Exam and blood work including liver function tests was unremarkable aside from normochromic normocytic anemia. Cholecystitis was suspected and the patient underwent endoscopy followed by abdominal/ pelvic CT scan on 09/29/2013. This showed showed a normal gallbladder. Incidental findings included multiple simple cysts in the liver and aortic atherosclerosis. However mixed lytic and sclerotic bony lesions were noted. On 10/10/2013 the patient underwent biopsy of the right iliac bone, and this showed ([id number]) metastatic invasive ductal carcinoma, grade 2, estrogen and progesterone receptor strongly positive.  Jade Mooney has a remote history of breast cancer, which we tried to reconstruct. She was originally diagnosed early in 1997 with stage III disease, and underwent right mastectomy followed by adjuvant chemotherapy. She then participated in a [facility] protocol with high-dose chemotherapy followed by stem cell rescue 12/22/1995. Unfortunately later that year liver lesions were noted and were biopsy-proven to be metastatic breast cancer. This was not felt to be resectable. However the tumor was estrogen receptor positive and HER-2 positive. The patient was treated with Herceptin (she does not know for what period of time) and was then on tamoxifen until November of 2002. She also participated in a vaccine study at [facility].  Sunjai's subsequent history is as detailed below  INTERVAL  HISTORY: [name] returns today for followup of her metastatic breast cancer, accompanied by her husband [name].   She continues on anastrozole daily. She tolerates it well. She does not have the arthralgias or myalgias that some patients can develop. Vaginal dryness is not a concern. She only has very mild hot flashes at night. She also receives zolendronate every 3 months, with a dose due today. She takes calcium and vitamin D as well. She had a normal bone density remotely and is not likely have osteopenia problems given the zolendronate she is receiving.   We reviewed in detail her repeat PET scan which shows continuing response.  It does suggest possible thyroiditis. Repeat CA 2729 is pending  REVIEW OF SYSTEMS: Jade Mooney  Still has bony pain related to her cancer.  This localizes chiefly to the upper femur and left knee. Adnexa difficult for her to walk. She uses a cane.She takes allotted 4 mg around lunchtime and in the evening for this. she is not constipated because she takes MiraLAX 1 teaspoon with coffee once or twice daily. She also takes prunes every day and drinks a lot of water. The dilated does not make her sleepy or nauseated. In the morning she gets up showers as breakfast does a little bit of housework health repair lunch they do [name] the afternoon both of them cook in the evening. She does notice she is a little bit more tired and is going to bed a little bit earlier. In the morning she noticed that her right hand but not her left hand feels numb for maybe an hour before it becomes a little bit more normal. She has had no weight loss but rather weight gain, no diarrhea, and no hair thinning so if there is thyroiditis it is  clinically silent.  A detailed review of systems today was otherwise stable.  PAST MEDICAL HISTORY: Past Medical History  Diagnosis Date  . Cancer     Breast 1997  . TIA (transient ischemic attack)   . Hypothyroidism   . Hypercholesterolemia   . Osteoarthritis   .  GERD (gastroesophageal reflux disease)     PAST SURGICAL HISTORY: Past Surgical History  Procedure Laterality Date  . Mastectomy modified radical Right   . Cataract extraction, bilateral    . Total knee arthroplasty Left   . Tonsillectomy    . Tubal ligation      FAMILY HISTORY No family history on file. The patient's father died at the age of 75 from heart disease. The patient's mother died at the age of 36. The patient had no brothers, one sister. One of the patient's mother's 5 sisters was diagnosed with breast cancer in her 58s  GYNECOLOGIC HISTORY:  No LMP recorded. Patient is postmenopausal. Menarche age 69, the patient is GX P0. She stopped having periods approximately 1994. She used hormone replacement until 1997. 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 Her husband Jade Mooney (goes by "Jade Mooney" was [employer]. of information all services at [employer]. They live alone, with no pets.    ADVANCED DIRECTIVES: In place   HEALTH MAINTENANCE: Social History  Substance Use Topics  . Smoking status: Never Smoker   . Smokeless tobacco: Not on file  . Alcohol Use: Not on file     Colonoscopy: 2012  PAP: 2010  Bone density: 2012  Lipid panel:  Allergies  Allergen Reactions  . Amoxicillin Hives  . Percocet [Oxycodone-Acetaminophen] Nausea And Vomiting  . Percodan [Oxycodone-Aspirin] Nausea And Vomiting    Current Outpatient Prescriptions  Medication Sig Dispense Refill  . acetaminophen (TYLENOL) 500 MG tablet Take 1,000 mg by mouth every 6 (six) hours as needed for mild pain.    Marland Kitchen anastrozole (ARIMIDEX) 1 MG tablet Take 1 tablet (1 mg total) by mouth daily. 90 tablet 3  . Ascorbic Acid (VITAMIN C) 1000 MG tablet Take 1,000 mg by mouth daily.    Marland Kitchen aspirin EC 325 MG tablet Take 325 mg by mouth daily.    . beta carotene 15 MG capsule Take 15 mg by mouth daily.    . calcium  carbonate (OS-CAL) 600 MG TABS tablet Take 600 mg by mouth 2 (two) times daily with a meal.    . Calcium Carbonate-Vitamin D (CALCIUM + D PO) Take 1 tablet by mouth 2 (two) times daily.    . Cholecalciferol 1000 UNITS tablet Take 1,000 Units by mouth daily.    . fluticasone (FLONASE) 50 MCG/ACT nasal spray Place 1 spray into both nostrils daily.    Marland Kitchen HYDROmorphone (DILAUDID) 4 MG tablet Take 1 tablet (4 mg total) by mouth every 6 (six) hours as needed for severe pain. 120 tablet 0  . ketotifen (ALAWAY) 0.025 % ophthalmic solution Place 1 drop into both eyes 2 (two) times daily.    Marland Kitchen levothyroxine (SYNTHROID, LEVOTHROID) 88 MCG tablet Take 88 mcg by mouth daily before breakfast.    . loratadine (CLARITIN) 10 MG tablet Take 10 mg by mouth daily.    Marland Kitchen LORazepam (ATIVAN) 0.5 MG tablet Use for symptoms associated with procedures/scans. Take one tab 30 minutes prior to procedure and repeat if needed. (Patient not taking: Reported on 10/05/2014) 10 tablet 0  .  Lutein 6 MG TABS Take 6 mg by mouth daily.    Marland Kitchen METRONIDAZOLE, TOPICAL, 0.75 % LOTN Apply 1 application topically daily.    . Multiple Vitamins-Minerals (MULTIVITAMIN WITH MINERALS) tablet Take 1 tablet by mouth daily.    . pantoprazole (PROTONIX) 40 MG tablet Take 40 mg by mouth 2 (two) times daily.    Vladimir Faster Glycol-Propyl Glycol (SYSTANE OP) Apply 1 drop to eye 3 (three) times daily.    . polyethylene glycol (MIRALAX / GLYCOLAX) packet Take 17 g by mouth daily.    Marland Kitchen PROCTOSOL HC 2.5 % rectal cream     . simvastatin (ZOCOR) 40 MG tablet Take 40 mg by mouth daily.    . vitamin E (VITAMIN E) 400 UNIT capsule Take 400 Units by mouth daily.     No current facility-administered medications for this visit.   Facility-Administered Medications Ordered in Other Visits  Medication Dose Route Frequency Provider Last Rate Last Dose  . fludeoxyglucose F - 18 (FDG) injection 7.05 milli Curie  7.05 milli Curie Intravenous Once PRN Medication Radiologist,  MD   7.05 milli Curie at 12/28/14 0825    OBJECTIVE: Middle-aged white Mooney who appears stated age 69 Vitals:   01/01/15 0823  BP: 151/73  Pulse: 58  Temp: 97.8 F (36.6 C)  Resp: 18     Body mass index is 25.24 kg/(m^2).    ECOG FS:2 - Symptomatic, <50% confined to bed  Sclerae unicteric, pupils round and equal Oropharynx clear and moist-- no thrush or other lesions No cervical or supraclavicular adenopathy Lungs no rales or rhonchi Heart regular rate and rhythm Abd soft, nontender, positive bowel sounds MSK no focal spinal tenderness, no upper extremity lymphedema Neuro: nonfocal, well oriented, appropriate affect Breasts:  The right breast is status post mastectomy. There is no evidence of chest wall recurrence. The right axilla is benign. The left breast is unremarkable  skin: The  Cancer nodules previously noted in the right temporal and back of scalp  Are no longer palpable    LAB RESULTS: CMP     Component Value Date/Time   NA 139 10/05/2014 0918   NA 141 01/17/2011 0946   K 4.1 10/05/2014 0918   K 4.3 01/17/2011 0946   CL 101 01/17/2011 0946   CO2 27 10/05/2014 0918   CO2 31 01/17/2011 0946   GLUCOSE 72 10/05/2014 0918   GLUCOSE 68* 01/17/2011 0946   BUN 18.0 10/05/2014 0918   BUN 19 01/17/2011 0946   CREATININE 0.8 10/05/2014 0918   CREATININE 0.80 04/11/2014 1026   CALCIUM 9.3 10/05/2014 0918   CALCIUM 9.7 01/17/2011 0946   PROT 6.9 10/05/2014 0918   PROT 6.9 01/17/2011 0946   ALBUMIN 4.1 10/05/2014 0918   ALBUMIN 4.7 01/17/2011 0946   AST 21 10/05/2014 0918   AST 21 01/17/2011 0946   ALT 11 10/05/2014 0918   ALT 17 01/17/2011 0946   ALKPHOS 80 10/05/2014 0918   ALKPHOS 75 01/17/2011 0946   BILITOT 0.35 10/05/2014 0918   BILITOT 0.3 01/17/2011 0946    I No results found for: SPEP  Lab Results  Component Value Date   WBC 5.0 10/05/2014   NEUTROABS 3.1 10/05/2014   HGB 13.1 10/05/2014   HCT 40.4 10/05/2014   MCV 81.9 10/05/2014   PLT  218 10/05/2014      Chemistry      Component Value Date/Time   NA 139 10/05/2014 0918   NA 141 01/17/2011 0946   K 4.1 10/05/2014  0918   K 4.3 01/17/2011 0946   CL 101 01/17/2011 0946   CO2 27 10/05/2014 0918   CO2 31 01/17/2011 0946   BUN 18.0 10/05/2014 0918   BUN 19 01/17/2011 0946   CREATININE 0.8 10/05/2014 0918   CREATININE 0.80 04/11/2014 1026      Component Value Date/Time   CALCIUM 9.3 10/05/2014 0918   CALCIUM 9.7 01/17/2011 0946   ALKPHOS 80 10/05/2014 0918   ALKPHOS 75 01/17/2011 0946   AST 21 10/05/2014 0918   AST 21 01/17/2011 0946   ALT 11 10/05/2014 0918   ALT 17 01/17/2011 0946   BILITOT 0.35 10/05/2014 0918   BILITOT 0.3 01/17/2011 0946     Results for SHANIYAH, WIX (MRN [id number]) as of 10/05/2014 17:12  Ref. Range 01/11/2014 09:54 07/13/2014 08:56 10/05/2014 09:18  CA 27.29 Latest Ref Range: 0-39 U/mL 993 (H) 96 (H) 54 (H)   No components found for: LABCA125  No results for input(s): INR in the last 168 hours.  Urinalysis No results found for: COLORURINE  STUDIES: Nm Pet Image Restage (ps) Whole Body  12/28/2014   CLINICAL DATA:  Subsequent treatment strategy for breast cancer with bone metastasis.  EXAM: NUCLEAR MEDICINE PET WHOLE BODY  TECHNIQUE: 7.1 mCi F-18 FDG was injected intravenously. Full-ring PET imaging was performed from the vertex to the feet after the radiotracer. CT data was obtained and used for attenuation correction and anatomic localization.  FASTING BLOOD GLUCOSE:  Value:  90 for mg/dl  COMPARISON:  04/11/2014.  FINDINGS: HEAD/ NECK  Diffuse thyroid hypermetabolism, slightly increased. For example, this measures a S.U.V. max of 5.7 today versus a S.U.V. max of 5.0 on the prior.  CHEST  No areas of abnormal hypermetabolism.  ABDOMEN/PELVIS  No areas of abnormal hypermetabolism.  SKELETON  Widespread sclerotic osseous metastasis, similar in CT distribution. An index focus of hypermetabolism within the left ischium measures a S.U.V.  max of 2.5 on image/series 203/4. This area measured a S.U.V. max of 3.8 on the prior exam (when remeasured). No new hypermetabolic osseous foci identified.  CT IMAGES PERFORMED FOR ATTENUATION CORRECTION  No cervical adenopathy. Multivessel coronary artery atherosclerosis. Right mastectomy. No axillary adenopathy. Medial left chest wall nodule is unchanged, including on image/series 90/4. On the order of 5 mm. Extensive colonic diverticulosis. Pelvic floor laxity. Left knee arthroplasty. Proximal bilateral femoral shaft sclerosis, also likely related to metastatic disease.  IMPRESSION: 1. Extensive sclerotic osseous metastasis. Primarily non FDG avid. An area of mild hypermetabolism within left hemipelvis is slightly decreased. 2. No evidence of extraosseous metastasis. 3. Coronary artery atherosclerosis. 4. Diffuse hypermetabolism involving the thyroid, slightly increased. Consider correlation with thyroid function test to exclude thyroiditis.   Electronically Signed   By: Abigail Miyamoto M.D.   On: 12/28/2014 11:07     ASSESSMENT: 69 y.o. Jade Mooney with stage IV breast cancer (bone metastatic disease)  (1) s/p Right mastectomy 1997 for an estrogen receptor and HER-2 positive breast cancer, treated with  (a) adjuvant chemotherapy according to CALGB 9640 (taxotere x 4 cycles)  (b) high-dose chemotherapy (cyclophosphamide, carboplatin, BCNU) followed by stem cell rescue at [facility] 12/22/1995  (c) biopsy-proven metastatic disease November 2007, estrogen receptor and HER-2 positive  (d) trastuzumab (dose? Duration?)  (e) tamoxifen for 5 years completed 2002  (f) participation in a vaccine study at [facility] 2003 (CEA primed dendritic cells, HER-2 primed dendritic cells)    (2) status post right iliac crest biopsy 10/10/2013 for invasive ductal carcinoma, grade 2, estrogen  and progesterone receptor positive  (3) zolendronate started 10/12/2013, to be repeated every 12 weeks  (a) dexa scan 07/28/2013  normal  (4) biopsy of a scalp lesion 10/17/2013 showed metastatic breast cancer, HER-2/neu negative  (5) anastrozole started 10/28/2013  (a) measurable disease = subcutaneous nodules in scalp and LLQ abdomen  (b)  CA-27-29 is informative  (c)  Repeat PET scan 12/28/2014 shows continuing response ; exam shows resolution of scalp lesions    (6) neoplasia associated pain  (a) painful blastic lesion left femoral intertrochanteric region: Status post radiation completed November 2015  (b)  Pain left knee, status post TKR remotely  PLAN: Mayerly is now a little over a year out from her diagnosis of metastatic breast cancer. She is tolerating the anastrozole and zolendronate  very well.  We discussed adding Palbociclib. This has been shown to prolong the time to disease progression. However it can drop counts and can increase fatigue problems. I think it would compromise her quality of life. We are going to continue as we are and if /when there is disease progression at some point we will probably go to exemestane Ann everolimus and then if there is progression after that we will try  Fulvestrant and Palbociclib.   She may have unrelated thyroiditis. I got some thyroid tests today. If that is the case I will alert her primary care physician.  I do think she has some carpal tunnel involving the right wrist. I suggested she wear a splint and see that improved those symptoms.  Otherwise the plan is to continue the current treatment. Specifically she will receive zolendronate in 3 and 6 months. After that, whenever she reaches the 2 year mark, we will broaden the zolendronate to every 4 months, and then after another year to twice a year.    she is doing well with her pain, and is managing the constipation secondary to the Dilaudid well.  She understands her breast cancer as a chronic illness, and I Make it "go away completely", much as we both would like to, but from my point she is doing remarkably  well.  She knows to call for any problems that may develop before her next visit here.   Chauncey Cruel, MD   01/01/2015 8:28 AM    Medical Oncology and Hematology [facility] [address] [place], [place] [address] Tel. [phone]    Fax. [phone]

## 2015-01-01 NOTE — Patient Instructions (Signed)

## 2015-01-01 NOTE — Telephone Encounter (Signed)
Appointments made and patient will get a new avs in chemo °

## 2015-01-02 ENCOUNTER — Encounter: Payer: Self-pay | Admitting: Oncology

## 2015-01-02 ENCOUNTER — Other Ambulatory Visit: Payer: Self-pay | Admitting: Oncology

## 2015-01-02 LAB — T4, FREE: Free T4: 1.37 ng/dL (ref 0.80–1.80)

## 2015-01-02 LAB — CANCER ANTIGEN 27.29: CA 27.29: 43 U/mL — ABNORMAL HIGH (ref 0–39)

## 2015-01-02 LAB — T3, FREE: T3, Free: 2.4 pg/mL (ref 2.3–4.2)

## 2015-01-02 NOTE — Progress Notes (Unsigned)
I called Jade Mooney with her thyroid results which are not suggestive of thyroiditis. I'm sending the results to her local doctor  for further workup at his discretion.

## 2015-01-15 DIAGNOSIS — E782 Mixed hyperlipidemia: Secondary | ICD-10-CM | POA: Diagnosis not present

## 2015-01-15 DIAGNOSIS — D510 Vitamin B12 deficiency anemia due to intrinsic factor deficiency: Secondary | ICD-10-CM | POA: Diagnosis not present

## 2015-01-15 DIAGNOSIS — E069 Thyroiditis, unspecified: Secondary | ICD-10-CM | POA: Diagnosis not present

## 2015-01-16 DIAGNOSIS — E069 Thyroiditis, unspecified: Secondary | ICD-10-CM | POA: Diagnosis not present

## 2015-01-19 ENCOUNTER — Ambulatory Visit
Admission: RE | Admit: 2015-01-19 | Discharge: 2015-01-19 | Disposition: A | Discharge status: Home / Self Care | Payer: Medicare Other | Source: Ambulatory Visit

## 2015-01-19 DIAGNOSIS — Z1231 Encounter for screening mammogram for malignant neoplasm of breast: Secondary | ICD-10-CM | POA: Diagnosis not present

## 2015-01-22 DIAGNOSIS — C50919 Malignant neoplasm of unspecified site of unspecified female breast: Secondary | ICD-10-CM | POA: Diagnosis not present

## 2015-01-22 DIAGNOSIS — G4700 Insomnia, unspecified: Secondary | ICD-10-CM | POA: Diagnosis not present

## 2015-01-22 DIAGNOSIS — L719 Rosacea, unspecified: Secondary | ICD-10-CM | POA: Diagnosis not present

## 2015-01-22 DIAGNOSIS — D510 Vitamin B12 deficiency anemia due to intrinsic factor deficiency: Secondary | ICD-10-CM | POA: Diagnosis not present

## 2015-01-22 DIAGNOSIS — C7951 Secondary malignant neoplasm of bone: Secondary | ICD-10-CM | POA: Diagnosis not present

## 2015-01-29 ENCOUNTER — Telehealth: Payer: Self-pay | Admitting: *Deleted

## 2015-01-29 ENCOUNTER — Other Ambulatory Visit: Payer: Self-pay

## 2015-01-29 DIAGNOSIS — C7951 Secondary malignant neoplasm of bone: Principal | ICD-10-CM

## 2015-01-29 DIAGNOSIS — C50919 Malignant neoplasm of unspecified site of unspecified female breast: Secondary | ICD-10-CM

## 2015-01-29 MED ORDER — HYDROMORPHONE HCL 4 MG PO TABS: 4.0000 mg | ORAL_TABLET | Freq: Four times a day (QID) | ORAL | Status: DC | PRN

## 2015-01-29 NOTE — Telephone Encounter (Signed)
Patient called requesting a refill on dilaudid 4 mg, which is an appropriate request.  Prescription printed and signed by Dr. Jana Hakim- the prescription was mailed per patient's request.

## 2015-01-29 NOTE — Telephone Encounter (Signed)
Patient called requesting "refill for Hydromorphone.  This requires mailing to me at [place], [place] [address]."  Home number [phone].

## 2015-02-23 DIAGNOSIS — Z96652 Presence of left artificial knee joint: Secondary | ICD-10-CM | POA: Diagnosis not present

## 2015-02-23 DIAGNOSIS — Z471 Aftercare following joint replacement surgery: Secondary | ICD-10-CM | POA: Diagnosis not present

## 2015-02-26 DIAGNOSIS — D510 Vitamin B12 deficiency anemia due to intrinsic factor deficiency: Secondary | ICD-10-CM | POA: Diagnosis not present

## 2015-03-05 DIAGNOSIS — Z23 Encounter for immunization: Secondary | ICD-10-CM | POA: Diagnosis not present

## 2015-03-05 DIAGNOSIS — L910 Hypertrophic scar: Secondary | ICD-10-CM | POA: Diagnosis not present

## 2015-03-05 DIAGNOSIS — D219 Benign neoplasm of connective and other soft tissue, unspecified: Secondary | ICD-10-CM | POA: Diagnosis not present

## 2015-03-29 DIAGNOSIS — D510 Vitamin B12 deficiency anemia due to intrinsic factor deficiency: Secondary | ICD-10-CM | POA: Diagnosis not present

## 2015-04-17 ENCOUNTER — Ambulatory Visit (HOSPITAL_BASED_OUTPATIENT_CLINIC_OR_DEPARTMENT_OTHER): Payer: BLUE CROSS/BLUE SHIELD

## 2015-04-17 ENCOUNTER — Other Ambulatory Visit (HOSPITAL_BASED_OUTPATIENT_CLINIC_OR_DEPARTMENT_OTHER): Payer: Medicare Other

## 2015-04-17 ENCOUNTER — Ambulatory Visit (HOSPITAL_BASED_OUTPATIENT_CLINIC_OR_DEPARTMENT_OTHER): Payer: Medicare Other | Admitting: Nurse Practitioner

## 2015-04-17 ENCOUNTER — Encounter: Payer: Self-pay | Admitting: Nurse Practitioner

## 2015-04-17 VITALS — BP 155/89 | HR 63 | Temp 97.7°F | Resp 18 | Ht 64.0 in | Wt 148.6 lb

## 2015-04-17 DIAGNOSIS — G893 Neoplasm related pain (acute) (chronic): Secondary | ICD-10-CM | POA: Diagnosis not present

## 2015-04-17 DIAGNOSIS — C44501 Unspecified malignant neoplasm of skin of breast: Secondary | ICD-10-CM | POA: Diagnosis not present

## 2015-04-17 DIAGNOSIS — C50919 Malignant neoplasm of unspecified site of unspecified female breast: Secondary | ICD-10-CM | POA: Diagnosis not present

## 2015-04-17 DIAGNOSIS — C50911 Malignant neoplasm of unspecified site of right female breast: Secondary | ICD-10-CM

## 2015-04-17 DIAGNOSIS — C7951 Secondary malignant neoplasm of bone: Secondary | ICD-10-CM

## 2015-04-17 DIAGNOSIS — G459 Transient cerebral ischemic attack, unspecified: Secondary | ICD-10-CM

## 2015-04-17 LAB — BASIC METABOLIC PANEL
Anion Gap: 10 mEq/L (ref 3–11)
BUN: 17.2 mg/dL (ref 7.0–26.0)
CO2: 25 mEq/L (ref 22–29)
Calcium: 9.1 mg/dL (ref 8.4–10.4)
Chloride: 104 mEq/L (ref 98–109)
Creatinine: 0.8 mg/dL (ref 0.6–1.1)
EGFR: 72 mL/min/{1.73_m2} — ABNORMAL LOW (ref >90)
Glucose: 97 mg/dl (ref 70–140)
Potassium: 4 mEq/L (ref 3.5–5.1)
Sodium: 139 mEq/L (ref 136–145)

## 2015-04-17 LAB — CBC WITH DIFFERENTIAL/PLATELET
BASO%: 0.9 % (ref 0.0–2.0)
Basophils Absolute: 0 10*3/uL (ref 0.0–0.1)
EOS%: 1.1 % (ref 0.0–7.0)
Eosinophils Absolute: 0.1 10*3/uL (ref 0.0–0.5)
HCT: 41.9 % (ref 34.8–46.6)
HGB: 13.4 g/dL (ref 11.6–15.9)
LYMPH%: 17 % (ref 14.0–49.7)
MCH: 27.1 pg (ref 25.1–34.0)
MCHC: 32 g/dL (ref 31.5–36.0)
MCV: 84.6 fL (ref 79.5–101.0)
MONO#: 0.4 10*3/uL (ref 0.1–0.9)
MONO%: 8.3 % (ref 0.0–14.0)
NEUT#: 3.8 10*3/uL (ref 1.5–6.5)
NEUT%: 72.7 % (ref 38.4–76.8)
Platelets: 218 10*3/uL (ref 145–400)
RBC: 4.95 10*6/uL (ref 3.70–5.45)
RDW: 15.6 % — ABNORMAL HIGH (ref 11.2–14.5)
WBC: 5.2 10*3/uL (ref 3.9–10.3)
lymph#: 0.9 10*3/uL (ref 0.9–3.3)

## 2015-04-17 MED ORDER — SODIUM CHLORIDE 0.9 % IV SOLN
INTRAVENOUS | Status: DC
  Administered 2015-04-17: 11:00:00 via INTRAVENOUS

## 2015-04-17 MED ORDER — HYDROMORPHONE HCL 4 MG PO TABS: 4.0000 mg | ORAL_TABLET | Freq: Four times a day (QID) | ORAL | Status: DC | PRN

## 2015-04-17 MED ORDER — ZOLEDRONIC ACID 4 MG/100ML IV SOLN
4.0000 mg | Freq: Once | INTRAVENOUS | Status: AC
  Administered 2015-04-17: 4 mg via INTRAVENOUS
  Filled 2015-04-17: qty 100

## 2015-04-17 NOTE — Patient Instructions (Signed)

## 2015-04-17 NOTE — Progress Notes (Signed)
[facility]  [phone] [phone]   ID: Jade Mooney DOB: September 15, 1945  MR#: [id number]  EXB#:[id number]  PCP: Ann Held, MD GYN: Newton Pigg MD SU:  OTHER MD: Clarene Essex MD, Leslie Andrea M.D., Gatha Mayer MD  CHIEF COMPLAINT: Newly diagnosed metastatic breast cancer  CURRENT TREATMENT: Zolendronate, anastrozole  BREAST CANCER HISTORY: From the earlier summary notes:  Jade Mooney was referred to Dr. Clarene Essex for evaluation of abdominal discomfort and nausea. Exam and blood work including liver function tests was unremarkable aside from normochromic normocytic anemia. Cholecystitis was suspected and the patient underwent endoscopy followed by abdominal/ pelvic CT scan on 09/29/2013. This showed showed a normal gallbladder. Incidental findings included multiple simple cysts in the liver and aortic atherosclerosis. However mixed lytic and sclerotic bony lesions were noted. On 10/10/2013 the patient underwent biopsy of the right iliac bone, and this showed ([id number]) metastatic invasive ductal carcinoma, grade 2, estrogen and progesterone receptor strongly positive.  Jade Mooney has a remote history of breast cancer, which we tried to reconstruct. She was originally diagnosed early in 1997 with stage III disease, and underwent right mastectomy followed by adjuvant chemotherapy. She then participated in a [facility] protocol with high-dose chemotherapy followed by stem cell rescue 12/22/1995. Unfortunately later that year liver lesions were noted and were biopsy-proven to be metastatic breast cancer. This was not felt to be resectable. However the tumor was estrogen receptor positive and HER-2 positive. The patient was treated with Herceptin (she does not know for what period of time) and was then on tamoxifen until November of 2002. She also participated in a vaccine study at [facility].  Jade Mooney's subsequent history is as detailed below  INTERVAL HISTORY: Jade Mooney  returns today for followup of her metastatic breast cancer, accompanied by her husband Jade Mooney. She is due for her next dose of zolendronate today. She is also on anastrozole daily and tolerates this well with few complaints. She does not have additional arthralgias or myalgias, she denies vaginal dryness, her hot flashes are very mild.   REVIEW OF SYSTEMS: Ashrita is taking dilaudid just twice daily for her left knee and left femur pain, but wonders if she should take more. She does not understand why her pain is not any better since taking the dilaudid. She feels like she is able to do more while on this medication, and it does not make her drowsy. She is using miralax and prunes to avoid constipation. Other than the leg and knee pain, she has no complaints. She feels good. She just got back from a cruise in the [place]. She is waring the right wrist splint and has no more numbness or tingling to her hand. One of her physicians ordered 1/2 tablet of trazodone nightly because she is clenching her jaw while she sleeps. She denies stress. She has no shortness of breath, chest pain, cough, or palpitations. She denies headaches, dizziness, or vision changes. A detailed review of systems is otherwise stable.   PAST MEDICAL HISTORY: Past Medical History  Diagnosis Date  . Cancer     Breast 1997  . TIA (transient ischemic attack)   . Hypothyroidism   . Hypercholesterolemia   . Osteoarthritis   . GERD (gastroesophageal reflux disease)     PAST SURGICAL HISTORY: Past Surgical History  Procedure Laterality Date  . Mastectomy modified radical Right   . Cataract extraction, bilateral    . Total knee arthroplasty Left   . Tonsillectomy    . Tubal ligation  FAMILY HISTORY No family history on file. The patient's father died at the age of 32 from heart disease. The patient's mother died at the age of 65. The patient had no brothers, one sister. One of the patient's mother's 5 sisters was diagnosed  with breast cancer in her 75s  GYNECOLOGIC HISTORY:  No LMP recorded. Patient is postmenopausal. Menarche age 89, the patient is GX P0. She stopped having periods approximately 1994. She used hormone replacement until 1997. 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 Her husband [name] (goes by "[name]" was [employer]. of information all services at [employer]. They live alone, with no pets.    ADVANCED DIRECTIVES: In place   HEALTH MAINTENANCE: Social History  Substance Use Topics  . Smoking status: Never Smoker   . Smokeless tobacco: Not on file  . Alcohol Use: Not on file     Colonoscopy: 2012  PAP: 2010  Bone density: 2012  Lipid panel:  Allergies  Allergen Reactions  . Amoxicillin Hives  . Percocet [Oxycodone-Acetaminophen] Nausea And Vomiting  . Percodan [Oxycodone-Aspirin] Nausea And Vomiting    Current Outpatient Prescriptions  Medication Sig Dispense Refill  . anastrozole (ARIMIDEX) 1 MG tablet Take 1 tablet (1 mg total) by mouth daily. 90 tablet 3  . Ascorbic Acid (VITAMIN C) 1000 MG tablet Take 1,000 mg by mouth daily.    Marland Kitchen aspirin EC 325 MG tablet Take 325 mg by mouth daily.    . beta carotene 15 MG capsule Take 15 mg by mouth daily.    . calcium carbonate (OS-CAL) 600 MG TABS tablet Take 600 mg by mouth 2 (two) times daily with a meal.    . Calcium Carbonate-Vitamin D (CALCIUM + D PO) Take 1 tablet by mouth 2 (two) times daily.    . Cholecalciferol 1000 UNITS tablet Take 1,000 Units by mouth daily.    . fluticasone (FLONASE) 50 MCG/ACT nasal spray Place 1 spray into both nostrils daily.    Marland Kitchen HYDROmorphone (DILAUDID) 4 MG tablet Take 1 tablet (4 mg total) by mouth every 6 (six) hours as needed for severe pain. 120 tablet 0  . ketotifen (ALAWAY) 0.025 % ophthalmic solution Place 1 drop into both eyes 2 (two) times daily.    Marland Kitchen levothyroxine (SYNTHROID, LEVOTHROID)  88 MCG tablet Take 88 mcg by mouth daily before breakfast.    . Lutein 6 MG TABS Take 6 mg by mouth daily.    . Multiple Vitamins-Minerals (MULTIVITAMIN WITH MINERALS) tablet Take 1 tablet by mouth daily.    . pantoprazole (PROTONIX) 40 MG tablet Take 40 mg by mouth 2 (two) times daily.    Vladimir Faster Glycol-Propyl Glycol (SYSTANE OP) Apply 1 drop to eye 3 (three) times daily.    . polyethylene glycol (MIRALAX / GLYCOLAX) packet Take 17 g by mouth daily.    . simvastatin (ZOCOR) 40 MG tablet Take 40 mg by mouth daily.    . traZODone (DESYREL) 50 MG tablet Take 25 mg by mouth at bedtime. Pt takes 1/2 tablet before bedtime    . vitamin E (VITAMIN E) 400 UNIT capsule Take 400 Units by mouth daily.    Marland Kitchen zinc gluconate 50 MG tablet Take 1 tablet (50 mg total) by mouth daily.    Marland Kitchen acetaminophen (TYLENOL) 500 MG tablet Take 1,000 mg by mouth every 6 (six) hours as needed for mild pain. Reported on 04/17/2015    .  LORazepam (ATIVAN) 0.5 MG tablet Use for symptoms associated with procedures/scans. Take one tab 30 minutes prior to procedure and repeat if needed. (Patient not taking: Reported on 10/05/2014) 10 tablet 0  . METRONIDAZOLE, TOPICAL, 0.75 % LOTN Apply 1 application topically daily.    Marland Kitchen PROCTOSOL HC 2.5 % rectal cream Reported on 04/17/2015     No current facility-administered medications for this visit.    OBJECTIVE: Middle-aged white woman who appears stated age 42 Vitals:   04/17/15 0923  BP: 155/89  Pulse: 63  Temp: 97.7 F (36.5 C)  Resp: 18     Body mass index is 25.49 kg/(m^2).    ECOG FS:2 - Symptomatic, <50% confined to bed  Skin: warm, dry, no palpable nodules to scalp HEENT: sclerae anicteric, conjunctivae pink, oropharynx clear. No thrush or mucositis.  Lymph Nodes: No cervical or supraclavicular lymphadenopathy  Lungs: clear to auscultation bilaterally, no rales, wheezes, or rhonci  Heart: regular rate and rhythm  Abdomen: round, soft, non tender, positive bowel sounds    Musculoskeletal: No focal spinal tenderness, no peripheral edema  Neuro: non focal, well oriented, positive affect  Breasts: deferred  LAB RESULTS: CMP     Component Value Date/Time   NA 139 04/17/2015 0902   NA 141 01/17/2011 0946   K 4.0 04/17/2015 0902   K 4.3 01/17/2011 0946   CL 101 01/17/2011 0946   CO2 25 04/17/2015 0902   CO2 31 01/17/2011 0946   GLUCOSE 97 04/17/2015 0902   GLUCOSE 68* 01/17/2011 0946   BUN 17.2 04/17/2015 0902   BUN 19 01/17/2011 0946   CREATININE 0.8 04/17/2015 0902   CREATININE 0.80 04/11/2014 1026   CALCIUM 9.1 04/17/2015 0902   CALCIUM 9.7 01/17/2011 0946   PROT 6.5 01/01/2015 0754   PROT 6.9 01/17/2011 0946   ALBUMIN 4.1 01/01/2015 0754   ALBUMIN 4.7 01/17/2011 0946   AST 20 01/01/2015 0754   AST 21 01/17/2011 0946   ALT 14 01/01/2015 0754   ALT 17 01/17/2011 0946   ALKPHOS 65 01/01/2015 0754   ALKPHOS 75 01/17/2011 0946   BILITOT 0.47 01/01/2015 0754   BILITOT 0.3 01/17/2011 0946    I No results found for: SPEP  Lab Results  Component Value Date   WBC 5.2 04/17/2015   NEUTROABS 3.8 04/17/2015   HGB 13.4 04/17/2015   HCT 41.9 04/17/2015   MCV 84.6 04/17/2015   PLT 218 04/17/2015      Chemistry      Component Value Date/Time   NA 139 04/17/2015 0902   NA 141 01/17/2011 0946   K 4.0 04/17/2015 0902   K 4.3 01/17/2011 0946   CL 101 01/17/2011 0946   CO2 25 04/17/2015 0902   CO2 31 01/17/2011 0946   BUN 17.2 04/17/2015 0902   BUN 19 01/17/2011 0946   CREATININE 0.8 04/17/2015 0902   CREATININE 0.80 04/11/2014 1026      Component Value Date/Time   CALCIUM 9.1 04/17/2015 0902   CALCIUM 9.7 01/17/2011 0946   ALKPHOS 65 01/01/2015 0754   ALKPHOS 75 01/17/2011 0946   AST 20 01/01/2015 0754   AST 21 01/17/2011 0946   ALT 14 01/01/2015 0754   ALT 17 01/17/2011 0946   BILITOT 0.47 01/01/2015 0754   BILITOT 0.3 01/17/2011 0946     Results for JODINE, MUCHMORE (MRN [id number]) as of 04/17/2015 10:28  Ref. Range  01/11/2014 09:54 07/13/2014 08:56 10/05/2014 09:18 01/01/2015 07:54  CA 27.29 Latest Ref Range: 0-39 U/mL 993 (  H) 96 (H) 54 (H) 43 (H)    No components found for: LABCA125  No results for input(s): INR in the last 168 hours.  Urinalysis No results found for: COLORURINE  STUDIES: No results found.   ASSESSMENT: 70 y.o. [place] woman with stage IV breast cancer (bone metastatic disease)  (1) s/p Right mastectomy 1997 for an estrogen receptor and HER-2 positive breast cancer, treated with  (a) adjuvant chemotherapy according to CALGB 9640 (taxotere x 4 cycles)  (b) high-dose chemotherapy (cyclophosphamide, carboplatin, BCNU) followed by stem cell rescue at [place] 12/22/1995  (c) biopsy-proven metastatic disease November 2007, estrogen receptor and HER-2 positive  (d) trastuzumab (dose? Duration?)  (e) tamoxifen for 5 years completed 2002  (f) participation in a vaccine study at [facility] 2003 (CEA primed dendritic cells, HER-2 primed dendritic cells)    (2) status post right iliac crest biopsy 10/10/2013 for invasive ductal carcinoma, grade 2, estrogen and progesterone receptor positive  (3) zolendronate started 10/12/2013, to be repeated every 12 weeks  (a) dexa scan 07/28/2013 normal  (4) biopsy of a scalp lesion 10/17/2013 showed metastatic breast cancer, HER-2/neu negative  (5) anastrozole started 10/28/2013  (a) measurable disease = subcutaneous nodules in scalp and LLQ abdomen  (b)  CA-27-29 is informative  (c)  Repeat PET scan 12/28/2014 shows continuing response ; exam shows resolution of scalp lesions    (6) neoplasia associated pain  (a) painful blastic lesion left femoral intertrochanteric region: Status post radiation completed November 2015  (b)  Pain left knee, status post TKR remotely  PLAN: Crystallee is doing well today. Her main complaint is pain, but after discussion about what pain medicines are meant to do, I think she gained a better understanding. I advised that  pain medicines are to allow her to do more, so if going up from BID to TID is helpful, she should do so. She also now realizes that the dilaudid is not going to "cure" her pain, as it results likely from metastasis, but it can be controlled.   Britanny will proceed with her next cycle of zometa as planned. She will continue the anastrozole daily.  She will return in 3 months for follow up with Dr. Jana Hakim. Prior to this visit she will have a bone scan. She understands and agrees with this plan. She knows the goal of treatment in her case is control. She has been encouraged to call with any issues that might arise before her next visit here.   Laurie Panda, NP   04/17/2015 10:22 AM

## 2015-04-18 LAB — CANCER ANTIGEN 27.29: CA 27.29: 34.9 U/mL (ref 0.0–38.6)

## 2015-04-18 LAB — CANCER ANTIGEN 27-29 (PARALLEL TESTING): CA 27.29: 33 U/mL (ref 0–39)

## 2015-05-01 DIAGNOSIS — D510 Vitamin B12 deficiency anemia due to intrinsic factor deficiency: Secondary | ICD-10-CM | POA: Diagnosis not present

## 2015-05-03 DIAGNOSIS — M67911 Unspecified disorder of synovium and tendon, right shoulder: Secondary | ICD-10-CM | POA: Diagnosis not present

## 2015-05-03 DIAGNOSIS — L03031 Cellulitis of right toe: Secondary | ICD-10-CM | POA: Diagnosis not present

## 2015-05-03 DIAGNOSIS — R070 Pain in throat: Secondary | ICD-10-CM | POA: Diagnosis not present

## 2015-05-28 DIAGNOSIS — E538 Deficiency of other specified B group vitamins: Secondary | ICD-10-CM | POA: Diagnosis not present

## 2015-05-29 ENCOUNTER — Other Ambulatory Visit: Payer: Self-pay | Admitting: *Deleted

## 2015-05-29 DIAGNOSIS — C50911 Malignant neoplasm of unspecified site of right female breast: Secondary | ICD-10-CM

## 2015-05-29 DIAGNOSIS — C7951 Secondary malignant neoplasm of bone: Principal | ICD-10-CM

## 2015-05-29 MED ORDER — HYDROMORPHONE HCL 4 MG PO TABS: 4.0000 mg | ORAL_TABLET | Freq: Four times a day (QID) | ORAL | Status: DC | PRN

## 2015-05-31 ENCOUNTER — Telehealth: Payer: Self-pay | Admitting: *Deleted

## 2015-05-31 NOTE — Telephone Encounter (Signed)
Patient called inquiring about "Dilaudid refill.  Have not received a call or the Dilaudid prescription."  Informed it was started 05-28-2005 and is in the works.

## 2015-07-02 DIAGNOSIS — E538 Deficiency of other specified B group vitamins: Secondary | ICD-10-CM | POA: Diagnosis not present

## 2015-07-05 ENCOUNTER — Other Ambulatory Visit (HOSPITAL_BASED_OUTPATIENT_CLINIC_OR_DEPARTMENT_OTHER): Payer: Medicare Other

## 2015-07-05 ENCOUNTER — Ambulatory Visit (HOSPITAL_COMMUNITY)
Admission: RE | Admit: 2015-07-05 | Discharge: 2015-07-05 | Disposition: A | Discharge status: Home / Self Care | Payer: Medicare Other | Source: Ambulatory Visit | Attending: Oncology | Admitting: Oncology

## 2015-07-05 ENCOUNTER — Encounter (HOSPITAL_COMMUNITY)
Admission: RE | Admit: 2015-07-05 | Discharge: 2015-07-05 | Disposition: A | Discharge status: Home / Self Care | Payer: Medicare Other | Source: Ambulatory Visit | Attending: Oncology | Admitting: Oncology

## 2015-07-05 DIAGNOSIS — C50919 Malignant neoplasm of unspecified site of unspecified female breast: Secondary | ICD-10-CM

## 2015-07-05 DIAGNOSIS — C50911 Malignant neoplasm of unspecified site of right female breast: Secondary | ICD-10-CM

## 2015-07-05 DIAGNOSIS — C7951 Secondary malignant neoplasm of bone: Secondary | ICD-10-CM | POA: Insufficient documentation

## 2015-07-05 DIAGNOSIS — E069 Thyroiditis, unspecified: Secondary | ICD-10-CM | POA: Diagnosis not present

## 2015-07-05 DIAGNOSIS — Z853 Personal history of malignant neoplasm of breast: Secondary | ICD-10-CM | POA: Diagnosis not present

## 2015-07-05 DIAGNOSIS — R938 Abnormal findings on diagnostic imaging of other specified body structures: Secondary | ICD-10-CM | POA: Insufficient documentation

## 2015-07-05 LAB — COMPREHENSIVE METABOLIC PANEL
ALT: 11 U/L (ref 0–55)
AST: 19 U/L (ref 5–34)
Albumin: 4.2 g/dL (ref 3.5–5.0)
Alkaline Phosphatase: 57 U/L (ref 40–150)
Anion Gap: 9 mEq/L (ref 3–11)
BUN: 16.8 mg/dL (ref 7.0–26.0)
CO2: 28 mEq/L (ref 22–29)
Calcium: 9.3 mg/dL (ref 8.4–10.4)
Chloride: 102 mEq/L (ref 98–109)
Creatinine: 0.9 mg/dL (ref 0.6–1.1)
EGFR: 65 mL/min/{1.73_m2} — ABNORMAL LOW (ref >90)
Glucose: 93 mg/dl (ref 70–140)
Potassium: 4.1 mEq/L (ref 3.5–5.1)
Sodium: 140 mEq/L (ref 136–145)
Total Bilirubin: 0.48 mg/dL (ref 0.20–1.20)
Total Protein: 7.3 g/dL (ref 6.4–8.3)

## 2015-07-05 LAB — CBC WITH DIFFERENTIAL/PLATELET
BASO%: 0.5 % (ref 0.0–2.0)
Basophils Absolute: 0 10*3/uL (ref 0.0–0.1)
EOS%: 1.1 % (ref 0.0–7.0)
Eosinophils Absolute: 0.1 10*3/uL (ref 0.0–0.5)
HCT: 42.4 % (ref 34.8–46.6)
HGB: 13.7 g/dL (ref 11.6–15.9)
LYMPH%: 22.2 % (ref 14.0–49.7)
MCH: 27.6 pg (ref 25.1–34.0)
MCHC: 32.3 g/dL (ref 31.5–36.0)
MCV: 85.5 fL (ref 79.5–101.0)
MONO#: 0.4 10*3/uL (ref 0.1–0.9)
MONO%: 6.9 % (ref 0.0–14.0)
NEUT#: 4.3 10*3/uL (ref 1.5–6.5)
NEUT%: 69.3 % (ref 38.4–76.8)
Platelets: 226 10*3/uL (ref 145–400)
RBC: 4.96 10*6/uL (ref 3.70–5.45)
RDW: 14.9 % — ABNORMAL HIGH (ref 11.2–14.5)
WBC: 6.2 10*3/uL (ref 3.9–10.3)
lymph#: 1.4 10*3/uL (ref 0.9–3.3)

## 2015-07-05 MED ORDER — TECHNETIUM TC 99M MEDRONATE IV KIT
25.0000 | PACK | Freq: Once | INTRAVENOUS | Status: AC | PRN
  Administered 2015-07-05: 25 via INTRAVENOUS

## 2015-07-12 ENCOUNTER — Ambulatory Visit (HOSPITAL_BASED_OUTPATIENT_CLINIC_OR_DEPARTMENT_OTHER): Payer: Medicare Other

## 2015-07-12 ENCOUNTER — Other Ambulatory Visit: Payer: BLUE CROSS/BLUE SHIELD

## 2015-07-12 ENCOUNTER — Ambulatory Visit (HOSPITAL_BASED_OUTPATIENT_CLINIC_OR_DEPARTMENT_OTHER): Payer: Medicare Other | Admitting: Oncology

## 2015-07-12 ENCOUNTER — Telehealth: Payer: Self-pay | Admitting: Oncology

## 2015-07-12 VITALS — BP 155/79 | HR 51 | Temp 98.2°F | Resp 18 | Ht 64.0 in | Wt 141.6 lb

## 2015-07-12 DIAGNOSIS — C7951 Secondary malignant neoplasm of bone: Secondary | ICD-10-CM

## 2015-07-12 DIAGNOSIS — G893 Neoplasm related pain (acute) (chronic): Secondary | ICD-10-CM | POA: Diagnosis not present

## 2015-07-12 DIAGNOSIS — C50119 Malignant neoplasm of central portion of unspecified female breast: Secondary | ICD-10-CM

## 2015-07-12 DIAGNOSIS — C44501 Unspecified malignant neoplasm of skin of breast: Secondary | ICD-10-CM | POA: Diagnosis not present

## 2015-07-12 DIAGNOSIS — C50919 Malignant neoplasm of unspecified site of unspecified female breast: Secondary | ICD-10-CM | POA: Insufficient documentation

## 2015-07-12 DIAGNOSIS — C50911 Malignant neoplasm of unspecified site of right female breast: Secondary | ICD-10-CM

## 2015-07-12 MED ORDER — ZOLEDRONIC ACID 4 MG/100ML IV SOLN
4.0000 mg | Freq: Once | INTRAVENOUS | Status: AC
  Administered 2015-07-12: 4 mg via INTRAVENOUS
  Filled 2015-07-12: qty 100

## 2015-07-12 MED ORDER — HYDROMORPHONE HCL 4 MG PO TABS: 4.0000 mg | ORAL_TABLET | Freq: Four times a day (QID) | ORAL | Status: DC | PRN

## 2015-07-12 MED ORDER — SODIUM CHLORIDE 0.9 % IV SOLN
Freq: Once | INTRAVENOUS | Status: AC
  Administered 2015-07-12: 11:00:00 via INTRAVENOUS

## 2015-07-12 NOTE — Patient Instructions (Signed)

## 2015-07-12 NOTE — Addendum Note (Signed)
Addended by: San Morelle on: 07/12/2015 12:09 PM   Modules accepted: Orders

## 2015-07-12 NOTE — Telephone Encounter (Signed)
appt made and avs printed. Central radiology to [facility]

## 2015-07-12 NOTE — Progress Notes (Signed)
Located in discussed [place] yes I would just leave it alone and try to get her to lose weight so hopefully it'll work out by  [facility]  [phone] [phone]   ID: HALEEMAH BUCKALEW DOB: 1945-11-24  MR#: [id number]  ASN#:[id number]  PCP: Ann Held, MD GYN: Newton Pigg MD SU:  OTHER MD: Clarene Essex MD, Leslie Andrea M.D., Gatha Mayer MD  CHIEF COMPLAINT: Newly diagnosed metastatic breast cancer  CURRENT TREATMENT: Zolendronate, anastrozole  BREAST CANCER HISTORY: From the earlier summary notes:  Jaleesa was referred to Dr. Clarene Essex for evaluation of abdominal discomfort and nausea. Exam and blood work including liver function tests was unremarkable aside from normochromic normocytic anemia. Cholecystitis was suspected and the patient underwent endoscopy followed by abdominal/ pelvic CT scan on 09/29/2013. This showed showed a normal gallbladder. Incidental findings included multiple simple cysts in the liver and aortic atherosclerosis. However mixed lytic and sclerotic bony lesions were noted. On 10/10/2013 the patient underwent biopsy of the right iliac bone, and this showed ([id number]) metastatic invasive ductal carcinoma, grade 2, estrogen and progesterone receptor strongly positive.  Sanaia has a remote history of breast cancer, which we tried to reconstruct. She was originally diagnosed early in 1997 with stage III disease, and underwent right mastectomy followed by adjuvant chemotherapy. She then participated in a [facility] protocol with high-dose chemotherapy followed by stem cell rescue 12/22/1995. Unfortunately later that year liver lesions were noted and were biopsy-proven to be metastatic breast cancer. This was not felt to be resectable. However the tumor was estrogen receptor positive and HER-2 positive. The patient was treated with Herceptin (she does not know for what period of time) and was then on tamoxifen until November of 2002. She also  participated in a vaccine study at [facility].  Avalee's subsequent history is as detailed below  INTERVAL HISTORY: Faven returns today for followup of her stage IV estrogen receptor positive breast cancer, accompanied by her husband [name]. She continues on anastrozole daily. She does well with that, without significant problems with hot flashes or night sweats. She also receives zolendronate every 3 months, with a dose due today  She just had a repeat bone scan which shows significant uptake but no obvious disease progression. Her most recent CA-27-29 remained in the normal range  REVIEW OF SYSTEMS: Honestee is experiencing more pain in her left hip area. This is an area that she has had prior radiation 2, with very little effect. They tried acupuncture but that unfortunately did not work. She is currently taking Dilaudid 3 times a day. This doesn't quite control the pain but it does allow her to function more normally. She does not let herself get constipated and takes MiraLAX daily as well as some prunes. Aside from the left hip problem of course she has continuing left knee issues. She is status post total knee replacement she has a funny sensation in the back of her throat left greater than right. It doesn't cause any swallowing problems, it is not painful, but it does feel different. She is scheduled to see Dr. Gaylyn Cheers , ENT in [place] May 4 for a fuller exam. Aside from these issues a detailed review of systems today was stable  PAST MEDICAL HISTORY: Past Medical History  Diagnosis Date  . Cancer ([place])     Breast 1997  . TIA (transient ischemic attack)   . Hypothyroidism   . Hypercholesterolemia   . Osteoarthritis   . GERD (gastroesophageal reflux disease)  PAST SURGICAL HISTORY: Past Surgical History  Procedure Laterality Date  . Mastectomy modified radical Right   . Cataract extraction, bilateral    . Total knee arthroplasty Left   . Tonsillectomy    . Tubal ligation      FAMILY  HISTORY No family history on file. The patient's father died at the age of 34 from heart disease. The patient's mother died at the age of 35. The patient had no brothers, one sister. One of the patient's mother's 5 sisters was diagnosed with breast cancer in her 33s  GYNECOLOGIC HISTORY:  No LMP recorded. Patient is postmenopausal. Menarche age 70, the patient is GX P0. She stopped having periods approximately 1994. She used hormone replacement until 1997. 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 Her husband Maisie Fus (goes by "Maisie Fus" was [employer]. of information all services at [employer]. They live alone, with no pets.    ADVANCED DIRECTIVES: In place   HEALTH MAINTENANCE: Social History  Substance Use Topics  . Smoking status: Never Smoker   . Smokeless tobacco: Not on file  . Alcohol Use: Not on file     Colonoscopy: 2012  PAP: 2010  Bone density: 2012  Lipid panel:  Allergies  Allergen Reactions  . Amoxicillin Hives  . Percocet [Oxycodone-Acetaminophen] Nausea And Vomiting  . Percodan [Oxycodone-Aspirin] Nausea And Vomiting    Current Outpatient Prescriptions  Medication Sig Dispense Refill  . acetaminophen (TYLENOL) 500 MG tablet Take 1,000 mg by mouth every 6 (six) hours as needed for mild pain. Reported on 04/17/2015    . anastrozole (ARIMIDEX) 1 MG tablet Take 1 tablet (1 mg total) by mouth daily. 90 tablet 3  . Ascorbic Acid (VITAMIN C) 1000 MG tablet Take 1,000 mg by mouth daily.    Marland Kitchen aspirin EC 325 MG tablet Take 325 mg by mouth daily.    . beta carotene 15 MG capsule Take 15 mg by mouth daily.    . calcium carbonate (OS-CAL) 600 MG TABS tablet Take 600 mg by mouth 2 (two) times daily with a meal.    . Calcium Carbonate-Vitamin D (CALCIUM + D PO) Take 1 tablet by mouth 2 (two) times daily.    . Cholecalciferol 1000 UNITS tablet Take 1,000 Units by mouth daily.      . fluticasone (FLONASE) 50 MCG/ACT nasal spray Place 1 spray into both nostrils daily.    Marland Kitchen HYDROmorphone (DILAUDID) 4 MG tablet Take 1 tablet (4 mg total) by mouth every 6 (six) hours as needed for severe pain. 120 tablet 0  . ketotifen (ALAWAY) 0.025 % ophthalmic solution Place 1 drop into both eyes 2 (two) times daily.    Marland Kitchen levothyroxine (SYNTHROID, LEVOTHROID) 88 MCG tablet Take 88 mcg by mouth daily before breakfast.    . LORazepam (ATIVAN) 0.5 MG tablet Use for symptoms associated with procedures/scans. Take one tab 30 minutes prior to procedure and repeat if needed. (Patient not taking: Reported on 10/05/2014) 10 tablet 0  . Lutein 6 MG TABS Take 6 mg by mouth daily.    Marland Kitchen METRONIDAZOLE, TOPICAL, 0.75 % LOTN Apply 1 application topically daily.    . Multiple Vitamins-Minerals (MULTIVITAMIN WITH MINERALS) tablet Take 1 tablet by mouth daily.    . pantoprazole (PROTONIX) 40 MG tablet Take 40 mg by mouth 2 (two) times daily.    Bertram Gala Glycol-Propyl Glycol (SYSTANE OP) Apply 1 drop  to eye 3 (three) times daily.    . polyethylene glycol (MIRALAX / GLYCOLAX) packet Take 17 g by mouth daily.    Marland Kitchen PROCTOSOL HC 2.5 % rectal cream Reported on 04/17/2015    . simvastatin (ZOCOR) 40 MG tablet Take 40 mg by mouth daily.    . traZODone (DESYREL) 50 MG tablet Take 25 mg by mouth at bedtime. Pt takes 1/2 tablet before bedtime    . vitamin E (VITAMIN E) 400 UNIT capsule Take 400 Units by mouth daily.    Marland Kitchen zinc gluconate 50 MG tablet Take 1 tablet (50 mg total) by mouth daily.     No current facility-administered medications for this visit.    OBJECTIVE: Middle-aged white womanIn no acute distress  Filed Vitals:   07/12/15 1000  BP: 155/79  Pulse: 51  Temp: 98.2 F (36.8 C)  Resp: 18     Body mass index is 24.29 kg/(m^2).    ECOG FS:1 - Symptomatic but completely ambulatory  Sclerae unicteric, pupils round and equal Oropharynx clear and moist-- no thrush or other lesions No cervical or  supraclavicular adenopathy Lungs no rales or rhonchi Heart regular rate and rhythm Abd soft, nontender, positive bowel sounds MSK no focal spinal tenderness, no upper extremity lymphedema, normal straight leg raising bilaterally Neuro: nonfocal, well oriented, appropriate affect Breasts: The right breast is status post mastectomy. There is no evidence of chest wall recurrence. The right axilla is benign. The left breast is unremarkable   LAB RESULTS: CMP     Component Value Date/Time   NA 140 07/05/2015 0831   NA 141 01/17/2011 0946   K 4.1 07/05/2015 0831   K 4.3 01/17/2011 0946   CL 101 01/17/2011 0946   CO2 28 07/05/2015 0831   CO2 31 01/17/2011 0946   GLUCOSE 93 07/05/2015 0831   GLUCOSE 68* 01/17/2011 0946   BUN 16.8 07/05/2015 0831   BUN 19 01/17/2011 0946   CREATININE 0.9 07/05/2015 0831   CREATININE 0.80 04/11/2014 1026   CALCIUM 9.3 07/05/2015 0831   CALCIUM 9.7 01/17/2011 0946   PROT 7.3 07/05/2015 0831   PROT 6.9 01/17/2011 0946   ALBUMIN 4.2 07/05/2015 0831   ALBUMIN 4.7 01/17/2011 0946   AST 19 07/05/2015 0831   AST 21 01/17/2011 0946   ALT 11 07/05/2015 0831   ALT 17 01/17/2011 0946   ALKPHOS 57 07/05/2015 0831   ALKPHOS 75 01/17/2011 0946   BILITOT 0.48 07/05/2015 0831   BILITOT 0.3 01/17/2011 0946    I No results found for: SPEP  Lab Results  Component Value Date   WBC 6.2 07/05/2015   NEUTROABS 4.3 07/05/2015   HGB 13.7 07/05/2015   HCT 42.4 07/05/2015   MCV 85.5 07/05/2015   PLT 226 07/05/2015      Chemistry      Component Value Date/Time   NA 140 07/05/2015 0831   NA 141 01/17/2011 0946   K 4.1 07/05/2015 0831   K 4.3 01/17/2011 0946   CL 101 01/17/2011 0946   CO2 28 07/05/2015 0831   CO2 31 01/17/2011 0946   BUN 16.8 07/05/2015 0831   BUN 19 01/17/2011 0946   CREATININE 0.9 07/05/2015 0831   CREATININE 0.80 04/11/2014 1026      Component Value Date/Time   CALCIUM 9.3 07/05/2015 0831   CALCIUM 9.7 01/17/2011 0946   ALKPHOS 57  07/05/2015 0831   ALKPHOS 75 01/17/2011 0946   AST 19 07/05/2015 0831   AST 21 01/17/2011 0946  ALT 11 07/05/2015 0831   ALT 17 01/17/2011 0946   BILITOT 0.48 07/05/2015 0831   BILITOT 0.3 01/17/2011 0946     Results for AZYIAH, BO (MRN [id number]) as of 07/12/2015 10:19  Ref. Range 01/11/2014 09:54 07/13/2014 08:56 10/05/2014 09:18 01/01/2015 07:54 04/17/2015 09:02  CA 27.29 Latest Ref Range: 0-39 U/mL 993 (H) 96 (H) 54 (H) 43 (H) 33   No components found for: LABCA125  No results for input(s): INR in the last 168 hours.  Urinalysis No results found for: COLORURINE  STUDIES: Nm Bone Scan Whole Body  07/05/2015  CLINICAL DATA:  History of breast cancer. EXAM: NUCLEAR MEDICINE WHOLE BODY BONE SCAN TECHNIQUE: Whole body anterior and posterior images were obtained approximately 3 hours after intravenous injection of radiopharmaceutical. RADIOPHARMACEUTICALS:  26.0 MCi Technetium-60mMDP IV COMPARISON:  PET-CT from 12/28/2014 FINDINGS: There is diffuse radiotracer uptake throughout the axial and appendicular skeleton compatible with known widespread osseous metastatic disease. There is asymmetric increased uptake within the left femoral head when compared with the right. The significance of this is uncertain. There is diminished activity visualized within the kidneys which may be seen with a super scan. IMPRESSION: 1. Imaging findings correlate with widespread sclerotic metastatic disease from patient's breast cancer. 2. There is asymmetric increased uptake within the left femoral head when compared with the right. Significance of this is uncertain. If there are symptoms referable to the left hip then consider further evaluation with contrast enhanced MRI of the left hip to assess for underlying pathologic fracture or enhancing tumor. Electronically Signed   By: TKerby MoorsM.D.   On: 07/05/2015 12:38     ASSESSMENT: 70y.o. [place] woman with stage IV breast cancer (bone metastatic  disease)  (1) s/p Right mastectomy 1997 for an estrogen receptor and HER-2 positive breast cancer, treated with  (a) adjuvant chemotherapy according to CALGB 9640 (taxotere x 4 cycles)  (b) high-dose chemotherapy (cyclophosphamide, carboplatin, BCNU) [place]  (c) biopsy-proven metastatic disease November 2007, estrogen receptor and HER-2 positive  (d) trastuzumab (dose? Duration?)  (e) tamoxifen for 5 years completed 2002  (f) [facility] ([facility]A primed dendritic cells, HER-2 primed dendritic cells)    (2) status post right iliac crest biopsy 10/10/2013 for invasive ductal carcinoma, grade 2, estrogen and progesterone receptor positive  (3) zolendronate started 10/12/2013, to be repeated every 12 weeks  (a) dexa scan 07/28/2013 normal  (4) biopsy of a scalp lesion 10/17/2013 showed metastatic breast cancer, HER-2/neu negative  (5) anastrozole started 10/28/2013  (a) measurable disease = subcutaneous nodules in scalp and LLQ abdomen  (b)  CA-27-29 is informative  (c)  Repeat PET scan 12/28/2014 shows continuing response ; exam shows resolution of scalp lesions    (6) neoplasia associated pain  (a) painful blastic lesion left femoral intertrochanteric region: Status post radiation completed November 2015  (b)  Pain left knee, status post TKR remotely  PLAN: We discussed her bone scan in detail and also her lab work. Overall things look stable and she is clinically stable as well.  The only thing that is really worse is the left femur/left hip mean. I refilled her Dilantin and she is doing a good job of controlling that, without getting constipated. However we need to evaluate this further. Given the fact that she had so much cancer there and then had radiation, she may need surgical intervention.  Accordingly I am putting her in for  an MRI of the left femur and left hip within the next week. We will refer her to Dr.  Theda Sers depending on results.  She is already scheduled to see an ENT week regarding her throat impotence, but I don't see anything abnormal by my exam today.  For some reason her CA-27-29 orders dropped out. I have gone ahead and reentered them.  She will receive zolendronate today and again in July 12. Incidentally [name] has some eye problems he requested a second opinion on and I referred him to [facility] ophthalmology.  Jilleen will see me again in July 12. She will receive her zolendronate on that day. She will have lab work that day as well. She will call for results of her hip MRI. She knows to call also for any other issues that may develop before the next visit here. Chauncey Cruel, MD   07/12/2015 10:18 AM

## 2015-07-16 ENCOUNTER — Other Ambulatory Visit: Payer: Self-pay

## 2015-07-16 ENCOUNTER — Other Ambulatory Visit: Payer: Self-pay | Admitting: Oncology

## 2015-07-16 DIAGNOSIS — C7951 Secondary malignant neoplasm of bone: Principal | ICD-10-CM

## 2015-07-16 DIAGNOSIS — C50911 Malignant neoplasm of unspecified site of right female breast: Secondary | ICD-10-CM

## 2015-07-16 MED ORDER — LORAZEPAM 0.5 MG PO TABS: ORAL_TABLET | ORAL | Status: DC

## 2015-07-20 ENCOUNTER — Ambulatory Visit (HOSPITAL_COMMUNITY)
Admission: RE | Admit: 2015-07-20 | Discharge: 2015-07-20 | Disposition: A | Discharge status: Home / Self Care | Payer: Medicare Other | Source: Ambulatory Visit | Attending: Oncology | Admitting: Oncology

## 2015-07-20 ENCOUNTER — Other Ambulatory Visit: Payer: Self-pay | Admitting: Oncology

## 2015-07-20 DIAGNOSIS — C7951 Secondary malignant neoplasm of bone: Secondary | ICD-10-CM

## 2015-07-20 DIAGNOSIS — M1612 Unilateral primary osteoarthritis, left hip: Secondary | ICD-10-CM | POA: Diagnosis not present

## 2015-07-20 DIAGNOSIS — M25552 Pain in left hip: Secondary | ICD-10-CM | POA: Diagnosis not present

## 2015-07-20 DIAGNOSIS — C50119 Malignant neoplasm of central portion of unspecified female breast: Secondary | ICD-10-CM

## 2015-07-20 DIAGNOSIS — C50911 Malignant neoplasm of unspecified site of right female breast: Secondary | ICD-10-CM

## 2015-07-20 DIAGNOSIS — K5790 Diverticulosis of intestine, part unspecified, without perforation or abscess without bleeding: Secondary | ICD-10-CM | POA: Insufficient documentation

## 2015-07-20 DIAGNOSIS — R937 Abnormal findings on diagnostic imaging of other parts of musculoskeletal system: Secondary | ICD-10-CM | POA: Insufficient documentation

## 2015-07-20 MED ORDER — GADOBENATE DIMEGLUMINE 529 MG/ML IV SOLN
15.0000 mL | Freq: Once | INTRAVENOUS | Status: AC | PRN
  Administered 2015-07-20: 13 mL via INTRAVENOUS

## 2015-07-23 ENCOUNTER — Other Ambulatory Visit: Payer: Self-pay | Admitting: Oncology

## 2015-07-23 ENCOUNTER — Encounter: Payer: Self-pay | Admitting: Oncology

## 2015-07-24 ENCOUNTER — Other Ambulatory Visit: Payer: Self-pay | Admitting: *Deleted

## 2015-07-24 DIAGNOSIS — E782 Mixed hyperlipidemia: Secondary | ICD-10-CM | POA: Diagnosis not present

## 2015-07-24 DIAGNOSIS — E069 Thyroiditis, unspecified: Secondary | ICD-10-CM | POA: Diagnosis not present

## 2015-07-24 DIAGNOSIS — C7951 Secondary malignant neoplasm of bone: Principal | ICD-10-CM

## 2015-07-24 DIAGNOSIS — C50919 Malignant neoplasm of unspecified site of unspecified female breast: Secondary | ICD-10-CM

## 2015-07-24 DIAGNOSIS — Z79899 Other long term (current) drug therapy: Secondary | ICD-10-CM | POA: Diagnosis not present

## 2015-07-24 DIAGNOSIS — M1612 Unilateral primary osteoarthritis, left hip: Secondary | ICD-10-CM

## 2015-07-26 DIAGNOSIS — K219 Gastro-esophageal reflux disease without esophagitis: Secondary | ICD-10-CM | POA: Diagnosis not present

## 2015-07-26 DIAGNOSIS — Z5111 Encounter for antineoplastic chemotherapy: Secondary | ICD-10-CM | POA: Diagnosis not present

## 2015-07-26 DIAGNOSIS — C50919 Malignant neoplasm of unspecified site of unspecified female breast: Secondary | ICD-10-CM | POA: Diagnosis not present

## 2015-07-26 DIAGNOSIS — R0989 Other specified symptoms and signs involving the circulatory and respiratory systems: Secondary | ICD-10-CM | POA: Diagnosis not present

## 2015-07-30 DIAGNOSIS — Z1389 Encounter for screening for other disorder: Secondary | ICD-10-CM | POA: Diagnosis not present

## 2015-07-30 DIAGNOSIS — M19041 Primary osteoarthritis, right hand: Secondary | ICD-10-CM | POA: Diagnosis not present

## 2015-07-30 DIAGNOSIS — E538 Deficiency of other specified B group vitamins: Secondary | ICD-10-CM | POA: Diagnosis not present

## 2015-07-30 DIAGNOSIS — M19042 Primary osteoarthritis, left hand: Secondary | ICD-10-CM | POA: Diagnosis not present

## 2015-08-13 ENCOUNTER — Other Ambulatory Visit: Payer: Self-pay

## 2015-08-13 DIAGNOSIS — C7951 Secondary malignant neoplasm of bone: Principal | ICD-10-CM

## 2015-08-13 DIAGNOSIS — C50911 Malignant neoplasm of unspecified site of right female breast: Secondary | ICD-10-CM

## 2015-08-13 MED ORDER — HYDROMORPHONE HCL 4 MG PO TABS: 4.0000 mg | ORAL_TABLET | Freq: Four times a day (QID) | ORAL | Status: DC | PRN

## 2015-08-13 NOTE — Telephone Encounter (Signed)
Writer called patient back, patient requesting to have prescription mailed to her.  Printed prescription, MD signed, mailed out to patient today.  Patient aware.

## 2015-08-29 DIAGNOSIS — D510 Vitamin B12 deficiency anemia due to intrinsic factor deficiency: Secondary | ICD-10-CM | POA: Diagnosis not present

## 2015-08-30 DIAGNOSIS — M1612 Unilateral primary osteoarthritis, left hip: Secondary | ICD-10-CM | POA: Diagnosis not present

## 2015-09-26 DIAGNOSIS — D510 Vitamin B12 deficiency anemia due to intrinsic factor deficiency: Secondary | ICD-10-CM | POA: Diagnosis not present

## 2015-10-01 ENCOUNTER — Other Ambulatory Visit: Payer: Self-pay | Admitting: Oncology

## 2015-10-02 ENCOUNTER — Other Ambulatory Visit: Payer: Self-pay | Admitting: *Deleted

## 2015-10-02 DIAGNOSIS — C50919 Malignant neoplasm of unspecified site of unspecified female breast: Secondary | ICD-10-CM

## 2015-10-02 DIAGNOSIS — G893 Neoplasm related pain (acute) (chronic): Secondary | ICD-10-CM

## 2015-10-02 DIAGNOSIS — C7951 Secondary malignant neoplasm of bone: Secondary | ICD-10-CM | POA: Insufficient documentation

## 2015-10-02 MED ORDER — ANASTROZOLE 1 MG PO TABS: 1.0000 mg | ORAL_TABLET | Freq: Every day | ORAL | Status: DC

## 2015-10-02 NOTE — Progress Notes (Signed)
[place]  [phone] [phone]) [phone]   ID: REESE SENK DOB: Dec 06, 1945  MR#: [id number]  UUV#:[id number]  PCP: Ann Held, MD GYN: Newton Pigg MD SU:  OTHER MD: Clarene Essex MD, Leslie Andrea M.D., Gatha Mayer MD  CHIEF COMPLAINT: Newly diagnosed metastatic breast cancer  CURRENT TREATMENT: Zolendronate, anastrozole  BREAST CANCER HISTORY: From the earlier summary notes:  Jade Mooney was referred to Dr. Clarene Essex for evaluation of abdominal discomfort and nausea. Exam and blood work including liver function tests was unremarkable aside from normochromic normocytic anemia. Cholecystitis was suspected and the patient underwent endoscopy followed by abdominal/ pelvic CT scan on 09/29/2013. This showed showed a normal gallbladder. Incidental findings included multiple simple cysts in the liver and aortic atherosclerosis. However mixed lytic and sclerotic bony lesions were noted. On 10/10/2013 the patient underwent biopsy of the right iliac bone, and this showed ([id number]) metastatic invasive ductal carcinoma, grade 2, estrogen and progesterone receptor strongly positive.  Jade Mooney has a remote history of breast cancer, which we tried to reconstruct. She was originally diagnosed early in 1997 with stage III disease, and underwent right mastectomy followed by adjuvant chemotherapy. She then participated in a [facility] protocol with high-dose chemotherapy followed by stem cell rescue 12/22/1995. Unfortunately later that year liver lesions were noted and were biopsy-proven to be metastatic breast cancer. This was not felt to be resectable. However the tumor was estrogen receptor positive and HER-2 positive. The patient was treated with Herceptin (she does not know for what period of time) and was then on tamoxifen until November of 2002. She also participated in a vaccine study at [facility].  Jade Mooney's subsequent history is as detailed below  INTERVAL HISTORY: Jade Mooney  returns today for followup of her estrogen receptor positive breast cancer with bone metastases, accompanied by her husband Jade Mooney. Interval history is significant for her continuing to have any for can't left hip pain. Recall we obtained an MRI of the left hip 07/20/2015, which showed many areas of marrow edema consistent with metastatic disease particularly in the left acetabulum, left femoral head, the femoral neck and the proximal diaphysis. There were no fractures. She has met with Dr. Wynelle Link and has been scheduled for hip replacement 12/26/2015.  Meanwhile, she continues on anastrozole. She tolerates that well. She does have some hot flashes and vaginal dryness issues, but what I can deal with those". Finally she receives zolendronate every 3 months with a dose due today.  REVIEW OF SYSTEMS: Aside from the hot flashes and night sweats, and the joint pains and left hip pain, she is doing well. She tolerates the hydromorphone with no significant nausea, confusion, sleepiness, or constipation. He doesn't last 6 hours ago and frequently she needs to take nonsteroidals or other form of pain to make up for it. A detailed review of systems today was otherwise stable  PAST MEDICAL HISTORY: Past Medical History  Diagnosis Date  . Cancer ([place])     Breast 1997  . TIA (transient ischemic attack)   . Hypothyroidism   . Hypercholesterolemia   . Osteoarthritis   . GERD (gastroesophageal reflux disease)     PAST SURGICAL HISTORY: Past Surgical History  Procedure Laterality Date  . Mastectomy modified radical Right   . Cataract extraction, bilateral    . Total knee arthroplasty Left   . Tonsillectomy    . Tubal ligation      FAMILY HISTORY No family history on file. The patient's father died at the age of 5  from heart disease. The patient's mother died at the age of 49. The patient had no brothers, one sister. One of the patient's mother's 5 sisters was diagnosed with breast cancer in her  29s  GYNECOLOGIC HISTORY:  No LMP recorded. Patient is postmenopausal. Menarche age 22, the patient is GX P0. She stopped having periods approximately 1994. She used hormone replacement until 1997. 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 Her husband Jade Mooney (goes by "Jade Mooney" was [employer]. of information all services at [employer]. They live alone, with no pets.    ADVANCED DIRECTIVES: In place   HEALTH MAINTENANCE: Social History  Substance Use Topics  . Smoking status: Never Smoker   . Smokeless tobacco: Not on file  . Alcohol Use: Not on file     Colonoscopy: 2012  PAP: 2010  Bone density: 2012  Lipid panel:  Allergies  Allergen Reactions  . Amoxicillin Hives  . Percocet [Oxycodone-Acetaminophen] Nausea And Vomiting  . Percodan [Oxycodone-Aspirin] Nausea And Vomiting    Current Outpatient Prescriptions  Medication Sig Dispense Refill  . acetaminophen (TYLENOL) 500 MG tablet Take 1,000 mg by mouth every 6 (six) hours as needed for mild pain. Reported on 04/17/2015    . anastrozole (ARIMIDEX) 1 MG tablet Take 1 tablet (1 mg total) by mouth daily. 90 tablet 3  . Ascorbic Acid (VITAMIN C) 1000 MG tablet Take 1,000 mg by mouth daily.    Marland Kitchen aspirin EC 325 MG tablet Take 325 mg by mouth daily.    . beta carotene 15 MG capsule Take 15 mg by mouth daily.    . calcium carbonate (OS-CAL) 600 MG TABS tablet Take 600 mg by mouth 2 (two) times daily with a meal.    . Calcium Carbonate-Vitamin D (CALCIUM + D PO) Take 1 tablet by mouth 2 (two) times daily.    . Cholecalciferol 1000 UNITS tablet Take 1,000 Units by mouth daily.    . fluticasone (FLONASE) 50 MCG/ACT nasal spray Place 1 spray into both nostrils daily.    Marland Kitchen HYDROmorphone (DILAUDID) 4 MG tablet Take 1 tablet (4 mg total) by mouth every 4 (four) hours as needed for severe pain. 160 tablet 0  . levothyroxine (SYNTHROID,  LEVOTHROID) 88 MCG tablet Take 88 mcg by mouth daily before breakfast.    . LORazepam (ATIVAN) 0.5 MG tablet Take one tablet 30 minutes prior to scan appt.  Repeat once if needed. 4 tablet 0  . Lutein 6 MG TABS Take 6 mg by mouth daily.    Marland Kitchen METRONIDAZOLE, TOPICAL, 0.75 % LOTN Apply 1 application topically daily.    . Multiple Vitamins-Minerals (MULTIVITAMIN WITH MINERALS) tablet Take 1 tablet by mouth daily.    . pantoprazole (PROTONIX) 40 MG tablet Take 40 mg by mouth 2 (two) times daily.    Vladimir Faster Glycol-Propyl Glycol (SYSTANE OP) Apply 1 drop to eye 3 (three) times daily.    . polyethylene glycol (MIRALAX / GLYCOLAX) packet Take 17 g by mouth daily.    Marland Kitchen PROCTOSOL HC 2.5 % rectal cream Reported on 04/17/2015    . simvastatin (ZOCOR) 40 MG tablet Take 40 mg by mouth daily.    . traZODone (DESYREL) 50 MG tablet Take 50 mg by mouth at bedtime.    . vitamin E (VITAMIN E) 400 UNIT capsule Take 400 Units by mouth daily.    Marland Kitchen zinc gluconate 50 MG tablet  Take 1 tablet (50 mg total) by mouth daily.     No current facility-administered medications for this visit.    OBJECTIVE: Middle-aged white woman who appears stated age 70 Vitals:   10/03/15 1127  BP: 163/92  Pulse: 59  Temp: 98.2 F (36.8 C)  Resp: 18     Body mass index is 24.24 kg/(m^2).    ECOG FS:2 - Symptomatic, <50% confined to bed  Sclerae unicteric, EOMs intact Oropharynx clear and moist No cervical or supraclavicular adenopathy Lungs no rales or rhonchi Heart regular rate and rhythm Abd soft, nontender, positive bowel sounds MSK no focal spinal tenderness, no upper extremity lymphedema Neuro: nonfocal, well oriented, appropriate affect Breasts: The right breast is status post mastectomy. There is no evidence of chest wall recurrence. The right axilla is benign. Left breast is unremarkable.    LAB RESULTS: CMP     Component Value Date/Time   NA 140 10/03/2015 1033   NA 141 01/17/2011 0946   K 4.1 10/03/2015  1033   K 4.3 01/17/2011 0946   CL 101 01/17/2011 0946   CO2 27 10/03/2015 1033   CO2 31 01/17/2011 0946   GLUCOSE 111 10/03/2015 1033   GLUCOSE 68* 01/17/2011 0946   BUN 12.7 10/03/2015 1033   BUN 19 01/17/2011 0946   CREATININE 0.9 10/03/2015 1033   CREATININE 0.80 04/11/2014 1026   CALCIUM 9.3 10/03/2015 1033   CALCIUM 9.7 01/17/2011 0946   PROT 7.0 10/03/2015 1033   PROT 6.9 01/17/2011 0946   ALBUMIN 4.1 10/03/2015 1033   ALBUMIN 4.7 01/17/2011 0946   AST 21 10/03/2015 1033   AST 21 01/17/2011 0946   ALT 16 10/03/2015 1033   ALT 17 01/17/2011 0946   ALKPHOS 67 10/03/2015 1033   ALKPHOS 75 01/17/2011 0946   BILITOT 0.45 10/03/2015 1033   BILITOT 0.3 01/17/2011 0946    I No results found for: SPEP  Lab Results  Component Value Date   WBC 4.8 10/03/2015   NEUTROABS 3.3 10/03/2015   HGB 13.0 10/03/2015   HCT 39.8 10/03/2015   MCV 84.3 10/03/2015   PLT 221 10/03/2015      Chemistry      Component Value Date/Time   NA 140 10/03/2015 1033   NA 141 01/17/2011 0946   K 4.1 10/03/2015 1033   K 4.3 01/17/2011 0946   CL 101 01/17/2011 0946   CO2 27 10/03/2015 1033   CO2 31 01/17/2011 0946   BUN 12.7 10/03/2015 1033   BUN 19 01/17/2011 0946   CREATININE 0.9 10/03/2015 1033   CREATININE 0.80 04/11/2014 1026      Component Value Date/Time   CALCIUM 9.3 10/03/2015 1033   CALCIUM 9.7 01/17/2011 0946   ALKPHOS 67 10/03/2015 1033   ALKPHOS 75 01/17/2011 0946   AST 21 10/03/2015 1033   AST 21 01/17/2011 0946   ALT 16 10/03/2015 1033   ALT 17 01/17/2011 0946   BILITOT 0.45 10/03/2015 1033   BILITOT 0.3 01/17/2011 0946      CA 27.29 0.0 - 38.6 U/mL 34.2 34.9CM         No results for input(s): INR in the last 168 hours.  Urinalysis No results found for: COLORURINE  STUDIES: No results found.   ASSESSMENT: 70 y.o. [place] woman with stage IV breast cancer (bone metastatic disease)  (1) s/p Right mastectomy 1997 for an estrogen receptor and HER-2  positive breast cancer, treated with  (a) adjuvant chemotherapy according to CALGB 9640 (taxotere x 4  cycles)  (b) high-dose chemotherapy (cyclophosphamide, carboplatin, BCNU) followed by stem cell rescue at [facility] 12/22/1995  (c) biopsy-proven metastatic disease November 2007, estrogen receptor and HER-2 positive  (d) trastuzumab (dose? Duration?)  (e) tamoxifen for 5 years completed 2002  (f) participation in a vaccine study at [facility] 2003 (CEA primed dendritic cells, HER-2 primed dendritic cells)    (2) status post right iliac crest biopsy 10/10/2013 for invasive ductal carcinoma, grade 2, estrogen and progesterone receptor positive  (3) zolendronate started 10/12/2013,  repeated every 12 weeks  (a) dexa scan 07/28/2013 normal  (4) biopsy of a scalp lesion 10/17/2013 showed metastatic breast cancer, HER-2/neu negative  (5) anastrozole started 10/28/2013  (a) measurable disease = subcutaneous nodules in scalp and LLQ abdomen  (b)  CA-27-29 is informative  (c)  Repeat PET scan 12/28/2014 shows continuing response ; exam shows resolution of scalp lesions    (6) neoplasia associated pain  (a) painful blastic lesion left femoral intertrochanteric region: Status post radiation completed November 2015  (b)  Pain left knee, status post TKR remotely  PLAN: Jade Mooney continues to do remarkably well except for the problem with the left hip. Hopefully that will be resolved when she undergoes left hip replacement October 4 under [facility].   She is tolerating the anastrozole well. Her CA-27-29 has normalized.  In the meantime she continues on her hydromorphone. The pill does not really quite last her every 6 hours. I have changed it so she can take it every 4 hours as needed. She understands she can also take Motrin or Aleve if she gets a little extra pain and that might help her minimize the use of narcotics.  Of course there is nothing wrong with her using narcotics except for the side effects  and the development of tolerance. The goal of pain medications is to help her do more and live as normally as possible. She is doing an excellent job in that regard.  She had some arthritis problems regularly in her hands after the last zolendronate dose. I doubt that that was related but today she is going to take some Motrin just to make sure that doesn't happen again.  Her surgery scheduled October 4 which happens to be the day she would normally have her next zolendronate. Were moving the next dose back to weeks to October 18. She will see me that same day  At some point, most likely in December, we will repeat a PET scan.  She knows to call for any problems that may develop before that visit.  Chauncey Cruel, MD   10/05/2015 3:53 PM Y problem he has is related

## 2015-10-03 ENCOUNTER — Other Ambulatory Visit (HOSPITAL_BASED_OUTPATIENT_CLINIC_OR_DEPARTMENT_OTHER): Payer: Medicare Other

## 2015-10-03 ENCOUNTER — Telehealth: Payer: Self-pay | Admitting: Oncology

## 2015-10-03 ENCOUNTER — Ambulatory Visit (HOSPITAL_BASED_OUTPATIENT_CLINIC_OR_DEPARTMENT_OTHER): Payer: Medicare Other

## 2015-10-03 ENCOUNTER — Ambulatory Visit (HOSPITAL_BASED_OUTPATIENT_CLINIC_OR_DEPARTMENT_OTHER): Payer: Medicare Other | Admitting: Oncology

## 2015-10-03 VITALS — BP 163/92 | HR 59 | Temp 98.2°F | Resp 18 | Ht 64.0 in | Wt 141.3 lb

## 2015-10-03 DIAGNOSIS — N951 Menopausal and female climacteric states: Secondary | ICD-10-CM | POA: Diagnosis not present

## 2015-10-03 DIAGNOSIS — C50119 Malignant neoplasm of central portion of unspecified female breast: Secondary | ICD-10-CM

## 2015-10-03 DIAGNOSIS — C50911 Malignant neoplasm of unspecified site of right female breast: Secondary | ICD-10-CM

## 2015-10-03 DIAGNOSIS — C7951 Secondary malignant neoplasm of bone: Secondary | ICD-10-CM

## 2015-10-03 DIAGNOSIS — G893 Neoplasm related pain (acute) (chronic): Secondary | ICD-10-CM

## 2015-10-03 DIAGNOSIS — C44501 Unspecified malignant neoplasm of skin of breast: Secondary | ICD-10-CM | POA: Diagnosis not present

## 2015-10-03 DIAGNOSIS — M25552 Pain in left hip: Secondary | ICD-10-CM | POA: Diagnosis not present

## 2015-10-03 LAB — COMPREHENSIVE METABOLIC PANEL
ALT: 16 U/L (ref 0–55)
AST: 21 U/L (ref 5–34)
Albumin: 4.1 g/dL (ref 3.5–5.0)
Alkaline Phosphatase: 67 U/L (ref 40–150)
Anion Gap: 10 mEq/L (ref 3–11)
BUN: 12.7 mg/dL (ref 7.0–26.0)
CO2: 27 mEq/L (ref 22–29)
Calcium: 9.3 mg/dL (ref 8.4–10.4)
Chloride: 103 mEq/L (ref 98–109)
Creatinine: 0.9 mg/dL (ref 0.6–1.1)
EGFR: 68 mL/min/{1.73_m2} — ABNORMAL LOW (ref >90)
Glucose: 111 mg/dl (ref 70–140)
Potassium: 4.1 mEq/L (ref 3.5–5.1)
Sodium: 140 mEq/L (ref 136–145)
Total Bilirubin: 0.45 mg/dL (ref 0.20–1.20)
Total Protein: 7 g/dL (ref 6.4–8.3)

## 2015-10-03 LAB — CBC WITH DIFFERENTIAL/PLATELET
BASO%: 0.7 % (ref 0.0–2.0)
Basophils Absolute: 0 10*3/uL (ref 0.0–0.1)
EOS%: 1.6 % (ref 0.0–7.0)
Eosinophils Absolute: 0.1 10*3/uL (ref 0.0–0.5)
HCT: 39.8 % (ref 34.8–46.6)
HGB: 13 g/dL (ref 11.6–15.9)
LYMPH%: 19.5 % (ref 14.0–49.7)
MCH: 27.5 pg (ref 25.1–34.0)
MCHC: 32.6 g/dL (ref 31.5–36.0)
MCV: 84.3 fL (ref 79.5–101.0)
MONO#: 0.5 10*3/uL (ref 0.1–0.9)
MONO%: 9.5 % (ref 0.0–14.0)
NEUT#: 3.3 10*3/uL (ref 1.5–6.5)
NEUT%: 68.7 % (ref 38.4–76.8)
Platelets: 221 10*3/uL (ref 145–400)
RBC: 4.72 10*6/uL (ref 3.70–5.45)
RDW: 15 % — ABNORMAL HIGH (ref 11.2–14.5)
WBC: 4.8 10*3/uL (ref 3.9–10.3)
lymph#: 0.9 10*3/uL (ref 0.9–3.3)

## 2015-10-03 MED ORDER — ZOLEDRONIC ACID 4 MG/100ML IV SOLN
4.0000 mg | Freq: Once | INTRAVENOUS | Status: AC
  Administered 2015-10-03: 4 mg via INTRAVENOUS
  Filled 2015-10-03: qty 100

## 2015-10-03 MED ORDER — HYDROMORPHONE HCL 4 MG PO TABS: 4.0000 mg | ORAL_TABLET | ORAL | Status: DC | PRN

## 2015-10-03 NOTE — Patient Instructions (Signed)

## 2015-10-03 NOTE — Telephone Encounter (Signed)
appt made and avs printed °

## 2015-10-04 LAB — CANCER ANTIGEN 27.29: CA 27.29: 34.2 U/mL (ref 0.0–38.6)

## 2015-10-04 LAB — CANCER ANTIGEN 27-29 (PARALLEL TESTING): CA 27.29: 36 U/mL (ref <38)

## 2015-10-23 DIAGNOSIS — D510 Vitamin B12 deficiency anemia due to intrinsic factor deficiency: Secondary | ICD-10-CM | POA: Diagnosis not present

## 2015-11-08 DIAGNOSIS — I10 Essential (primary) hypertension: Secondary | ICD-10-CM | POA: Diagnosis not present

## 2015-11-08 DIAGNOSIS — C50911 Malignant neoplasm of unspecified site of right female breast: Secondary | ICD-10-CM | POA: Diagnosis not present

## 2015-11-08 DIAGNOSIS — Z7982 Long term (current) use of aspirin: Secondary | ICD-10-CM | POA: Diagnosis not present

## 2015-11-08 DIAGNOSIS — R29818 Other symptoms and signs involving the nervous system: Secondary | ICD-10-CM | POA: Diagnosis not present

## 2015-11-08 DIAGNOSIS — R531 Weakness: Secondary | ICD-10-CM | POA: Diagnosis not present

## 2015-11-08 DIAGNOSIS — G459 Transient cerebral ischemic attack, unspecified: Secondary | ICD-10-CM | POA: Diagnosis not present

## 2015-11-08 DIAGNOSIS — Z79899 Other long term (current) drug therapy: Secondary | ICD-10-CM | POA: Diagnosis not present

## 2015-11-08 DIAGNOSIS — I160 Hypertensive urgency: Secondary | ICD-10-CM | POA: Diagnosis not present

## 2015-11-08 DIAGNOSIS — E039 Hypothyroidism, unspecified: Secondary | ICD-10-CM | POA: Diagnosis not present

## 2015-11-09 DIAGNOSIS — G459 Transient cerebral ischemic attack, unspecified: Secondary | ICD-10-CM | POA: Diagnosis not present

## 2015-11-09 DIAGNOSIS — I6523 Occlusion and stenosis of bilateral carotid arteries: Secondary | ICD-10-CM | POA: Diagnosis not present

## 2015-11-16 ENCOUNTER — Other Ambulatory Visit: Payer: Self-pay | Admitting: *Deleted

## 2015-11-16 DIAGNOSIS — C7951 Secondary malignant neoplasm of bone: Principal | ICD-10-CM

## 2015-11-16 DIAGNOSIS — G459 Transient cerebral ischemic attack, unspecified: Secondary | ICD-10-CM | POA: Diagnosis not present

## 2015-11-16 DIAGNOSIS — I10 Essential (primary) hypertension: Secondary | ICD-10-CM | POA: Diagnosis not present

## 2015-11-16 DIAGNOSIS — Z23 Encounter for immunization: Secondary | ICD-10-CM | POA: Diagnosis not present

## 2015-11-16 DIAGNOSIS — C50911 Malignant neoplasm of unspecified site of right female breast: Secondary | ICD-10-CM

## 2015-11-16 MED ORDER — HYDROMORPHONE HCL 4 MG PO TABS: 4.0000 mg | ORAL_TABLET | ORAL | 0 refills | Status: DC | PRN

## 2015-11-21 ENCOUNTER — Other Ambulatory Visit: Payer: Self-pay

## 2015-11-27 DIAGNOSIS — D510 Vitamin B12 deficiency anemia due to intrinsic factor deficiency: Secondary | ICD-10-CM | POA: Diagnosis not present

## 2015-11-28 ENCOUNTER — Ambulatory Visit: Payer: Self-pay | Admitting: Orthopedic Surgery

## 2015-11-29 ENCOUNTER — Encounter (HOSPITAL_COMMUNITY): Payer: Medicare Other

## 2015-11-30 DIAGNOSIS — Z1389 Encounter for screening for other disorder: Secondary | ICD-10-CM | POA: Diagnosis not present

## 2015-11-30 DIAGNOSIS — S90121A Contusion of right lesser toe(s) without damage to nail, initial encounter: Secondary | ICD-10-CM | POA: Diagnosis not present

## 2015-11-30 DIAGNOSIS — Z8673 Personal history of transient ischemic attack (TIA), and cerebral infarction without residual deficits: Secondary | ICD-10-CM | POA: Diagnosis not present

## 2015-11-30 DIAGNOSIS — Z0000 Encounter for general adult medical examination without abnormal findings: Secondary | ICD-10-CM | POA: Diagnosis not present

## 2015-12-05 ENCOUNTER — Encounter (HOSPITAL_COMMUNITY): Admission: RE | Payer: Self-pay | Source: Ambulatory Visit

## 2015-12-05 ENCOUNTER — Inpatient Hospital Stay (HOSPITAL_COMMUNITY): Admission: RE | Admit: 2015-12-05 | Payer: Medicare Other | Source: Ambulatory Visit | Admitting: Orthopedic Surgery

## 2015-12-05 SURGERY — ARTHROPLASTY, HIP, TOTAL, ANTERIOR APPROACH
Anesthesia: Choice | Site: Hip | Laterality: Left

## 2015-12-06 DIAGNOSIS — M1612 Unilateral primary osteoarthritis, left hip: Secondary | ICD-10-CM | POA: Diagnosis not present

## 2015-12-06 DIAGNOSIS — G459 Transient cerebral ischemic attack, unspecified: Secondary | ICD-10-CM | POA: Diagnosis not present

## 2015-12-26 ENCOUNTER — Telehealth: Payer: Self-pay | Admitting: *Deleted

## 2015-12-26 DIAGNOSIS — C801 Malignant (primary) neoplasm, unspecified: Secondary | ICD-10-CM | POA: Diagnosis not present

## 2015-12-26 DIAGNOSIS — L570 Actinic keratosis: Secondary | ICD-10-CM | POA: Diagnosis not present

## 2015-12-26 DIAGNOSIS — C792 Secondary malignant neoplasm of skin: Secondary | ICD-10-CM | POA: Diagnosis not present

## 2015-12-26 DIAGNOSIS — D045 Carcinoma in situ of skin of trunk: Secondary | ICD-10-CM | POA: Diagnosis not present

## 2015-12-26 NOTE — Telephone Encounter (Signed)
Open by mistake

## 2015-12-31 DIAGNOSIS — H353131 Nonexudative age-related macular degeneration, bilateral, early dry stage: Secondary | ICD-10-CM | POA: Diagnosis not present

## 2015-12-31 DIAGNOSIS — H40051 Ocular hypertension, right eye: Secondary | ICD-10-CM | POA: Diagnosis not present

## 2015-12-31 DIAGNOSIS — H26493 Other secondary cataract, bilateral: Secondary | ICD-10-CM | POA: Diagnosis not present

## 2016-01-09 ENCOUNTER — Telehealth: Payer: Self-pay

## 2016-01-09 ENCOUNTER — Ambulatory Visit (HOSPITAL_BASED_OUTPATIENT_CLINIC_OR_DEPARTMENT_OTHER): Payer: Medicare Other

## 2016-01-09 ENCOUNTER — Other Ambulatory Visit (HOSPITAL_BASED_OUTPATIENT_CLINIC_OR_DEPARTMENT_OTHER): Payer: Medicare Other

## 2016-01-09 ENCOUNTER — Encounter: Payer: Self-pay | Admitting: Oncology

## 2016-01-09 ENCOUNTER — Ambulatory Visit (HOSPITAL_BASED_OUTPATIENT_CLINIC_OR_DEPARTMENT_OTHER): Payer: Medicare Other | Admitting: Oncology

## 2016-01-09 VITALS — BP 147/74 | HR 60 | Temp 99.1°F | Resp 18

## 2016-01-09 VITALS — BP 153/67 | HR 59 | Temp 98.1°F | Resp 18 | Ht 64.0 in | Wt 140.1 lb

## 2016-01-09 DIAGNOSIS — C50119 Malignant neoplasm of central portion of unspecified female breast: Secondary | ICD-10-CM | POA: Diagnosis not present

## 2016-01-09 DIAGNOSIS — C44501 Unspecified malignant neoplasm of skin of breast: Secondary | ICD-10-CM

## 2016-01-09 DIAGNOSIS — C7951 Secondary malignant neoplasm of bone: Secondary | ICD-10-CM

## 2016-01-09 DIAGNOSIS — C7989 Secondary malignant neoplasm of other specified sites: Secondary | ICD-10-CM

## 2016-01-09 DIAGNOSIS — C50912 Malignant neoplasm of unspecified site of left female breast: Secondary | ICD-10-CM

## 2016-01-09 DIAGNOSIS — C50911 Malignant neoplasm of unspecified site of right female breast: Secondary | ICD-10-CM

## 2016-01-09 DIAGNOSIS — Z170 Estrogen receptor positive status [ER+]: Principal | ICD-10-CM

## 2016-01-09 DIAGNOSIS — M25552 Pain in left hip: Secondary | ICD-10-CM

## 2016-01-09 LAB — CBC WITH DIFFERENTIAL/PLATELET
BASO%: 0.5 % (ref 0.0–2.0)
Basophils Absolute: 0 10*3/uL (ref 0.0–0.1)
EOS%: 0.9 % (ref 0.0–7.0)
Eosinophils Absolute: 0.1 10*3/uL (ref 0.0–0.5)
HCT: 41.2 % (ref 34.8–46.6)
HGB: 13.4 g/dL (ref 11.6–15.9)
LYMPH%: 12.5 % — ABNORMAL LOW (ref 14.0–49.7)
MCH: 27.5 pg (ref 25.1–34.0)
MCHC: 32.5 g/dL (ref 31.5–36.0)
MCV: 84.4 fL (ref 79.5–101.0)
MONO#: 0.6 10*3/uL (ref 0.1–0.9)
MONO%: 10.1 % (ref 0.0–14.0)
NEUT#: 4.4 10*3/uL (ref 1.5–6.5)
NEUT%: 76 % (ref 38.4–76.8)
Platelets: 237 10*3/uL (ref 145–400)
RBC: 4.88 10*6/uL (ref 3.70–5.45)
RDW: 15.1 % — ABNORMAL HIGH (ref 11.2–14.5)
WBC: 5.8 10*3/uL (ref 3.9–10.3)
lymph#: 0.7 10*3/uL — ABNORMAL LOW (ref 0.9–3.3)

## 2016-01-09 LAB — COMPREHENSIVE METABOLIC PANEL
ALT: 10 U/L (ref 0–55)
AST: 19 U/L (ref 5–34)
Albumin: 4.1 g/dL (ref 3.5–5.0)
Alkaline Phosphatase: 75 U/L (ref 40–150)
Anion Gap: 10 mEq/L (ref 3–11)
BUN: 14.1 mg/dL (ref 7.0–26.0)
CO2: 25 mEq/L (ref 22–29)
Calcium: 9.2 mg/dL (ref 8.4–10.4)
Chloride: 106 mEq/L (ref 98–109)
Creatinine: 0.8 mg/dL (ref 0.6–1.1)
EGFR: 73 mL/min/{1.73_m2} — ABNORMAL LOW (ref >90)
Glucose: 103 mg/dl (ref 70–140)
Potassium: 4 mEq/L (ref 3.5–5.1)
Sodium: 141 mEq/L (ref 136–145)
Total Bilirubin: 0.42 mg/dL (ref 0.20–1.20)
Total Protein: 7.2 g/dL (ref 6.4–8.3)

## 2016-01-09 MED ORDER — HYDROMORPHONE HCL 4 MG PO TABS: 4.0000 mg | ORAL_TABLET | ORAL | 0 refills | Status: DC | PRN

## 2016-01-09 MED ORDER — ZOLEDRONIC ACID 4 MG/100ML IV SOLN
4.0000 mg | Freq: Once | INTRAVENOUS | Status: AC
  Administered 2016-01-09: 4 mg via INTRAVENOUS
  Filled 2016-01-09: qty 100

## 2016-01-09 NOTE — Telephone Encounter (Signed)
Received path from Dr Rod Holler. Received diagnostic imaging reports from [facility]. Copies sent to HIM to be scanned.  Copies to Dr Jana Hakim.

## 2016-01-09 NOTE — Progress Notes (Signed)
[place]  Telephone:(336) [phone] Fax:(336) [phone]   ID: Jade Mooney DOB: [birth year]  MR#: [id number]  MOQ#:[id number]  PCP: Jade Held, MD GYN: Jade Pigg MD SU:  OTHER MD: Jade Essex MD, Jade Mooney M.D., Jade Mayer MD  CHIEF COMPLAINT: Newly diagnosed metastatic breast cancer  CURRENT TREATMENT: Zolendronate, anastrozole  BREAST CANCER HISTORY: From the earlier summary notes:  Jade Mooney was referred to Dr. Clarene Mooney for evaluation of abdominal discomfort and nausea. Exam and blood work including liver function tests was unremarkable aside from normochromic normocytic anemia. Cholecystitis was suspected and the patient underwent endoscopy followed by abdominal/ pelvic CT scan on 09/29/2013. This showed showed a normal gallbladder. Incidental findings included multiple simple cysts in the liver and aortic atherosclerosis. However mixed lytic and sclerotic bony lesions were noted. On 10/10/2013 the patient underwent biopsy of the right iliac bone, and this showed ([id number]) metastatic invasive ductal carcinoma, grade 2, estrogen and progesterone receptor strongly positive.  Jade Mooney has a remote history of breast cancer, which we tried to reconstruct. She was originally diagnosed early in 1997 with stage III disease, and underwent right mastectomy followed by adjuvant chemotherapy. She then participated in a [facility] protocol with high-dose chemotherapy followed by stem cell rescue 12/22/1995. Unfortunately later that year liver lesions were noted and were biopsy-proven to be metastatic breast cancer. This was not felt to be resectable. However the tumor was estrogen receptor positive and HER-2 positive. The patient was treated with Herceptin (she does not know for what period of time) and was then on tamoxifen until November of 2002. She also participated in a vaccine study at [facility].  Jade Mooney's subsequent history is as detailed below  INTERVAL HISTORY: Jade Mooney  returns today for followup of her metastatic breast cancer accompanied by her husband Jade Mooney. She continues on anastrozole, generally with good tolerance. She obtains it at a good price.  Interval history is significant for her having developed a transient ischemic attack this was evaluated extensively through her primary care physician in [place]. We have copies of bilateral carotid duplex is obtained August 1717, which showed minimal plaque with no significant stenosis. She had a chest x-ray the same day which showed mild cardiomegaly but clear lungs. She had CT angiography of the brain the same day, which showed no emergent large vessel occlusion and no high-grade stenosis. 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 Fortunately her symptoms--primarily of focal a facial without motor deficits-- recovered after 30 minutes  Because of this episode she was started on Plavix. Also this has delayed her surgery, which had been already postponed once. And is now postponed until February 2018  Since her last visit here she also had biopsy of a lesion in her right anterior chest. This was found to be adenocarcinoma. There was no attempt at clear margins  REVIEW OF SYSTEMS: The only place Jade Mooney is hurting is the left hip. She is doing her best to remain active. She has had no bleeding complications from the Plavix. A detailed review of systems today was otherwise stable  PAST MEDICAL HISTORY: Past Medical History:  Diagnosis Date  . Cancer ([place])    Breast 1997  . GERD (gastroesophageal reflux disease)   . Hypercholesterolemia   . Hypothyroidism   . Osteoarthritis   . TIA (transient ischemic attack)     PAST SURGICAL HISTORY: Past Surgical History:  Procedure Laterality Date  .  CATARACT EXTRACTION, BILATERAL    . MASTECTOMY MODIFIED RADICAL Right   . TONSILLECTOMY    . TOTAL KNEE ARTHROPLASTY Left   . TUBAL  LIGATION      FAMILY HISTORY No family history on file. The patient's father died at the age of 3 from heart disease. The patient's mother died at the age of 43. The patient had no brothers, one sister. One of the patient's mother's 5 sisters was diagnosed with breast cancer in her 94s  GYNECOLOGIC HISTORY:  No LMP recorded. Patient is postmenopausal. Menarche age 5, the patient is GX P0. She stopped having periods approximately 1994. She used hormone replacement until 1997. 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 Her husband Jade Mooney (goes by "Jade Mooney" was [employer]. of information all services at [employer]. They live alone, with no pets.    ADVANCED DIRECTIVES: In place   HEALTH MAINTENANCE: Social History  Substance Use Topics  . Smoking status: Never Smoker  . Smokeless tobacco: Not on file  . Alcohol use Not on file     Colonoscopy: 2013  PAP: 2010  Bone density: 2012  Lipid panel:  Allergies  Allergen Reactions  . Amoxicillin Hives  . Percocet [Oxycodone-Acetaminophen] Nausea And Vomiting  . Percodan [Oxycodone-Aspirin] Nausea And Vomiting    Current Outpatient Prescriptions  Medication Sig Dispense Refill  . losartan (COZAAR) 25 MG tablet Take 25 mg by mouth daily.    Marland Kitchen acetaminophen (TYLENOL) 500 MG tablet Take 1,000 mg by mouth every 6 (six) hours as needed for mild pain. Reported on 04/17/2015    . anastrozole (ARIMIDEX) 1 MG tablet Take 1 tablet (1 mg total) by mouth daily. 90 tablet 3  . Ascorbic Acid (VITAMIN C) 1000 MG tablet Take 1,000 mg by mouth daily.    Marland Kitchen aspirin EC 325 MG tablet Take 325 mg by mouth daily.    Marland Kitchen b complex vitamins capsule Take 1 capsule by mouth daily.    . beta carotene 15 MG capsule Take 15 mg by mouth daily.    . calcium carbonate (OS-CAL) 600 MG TABS tablet Take 600 mg by mouth 2 (two) times daily with a meal.    . Calcium  Carbonate-Vitamin D (CALCIUM + D PO) Take 1 tablet by mouth 2 (two) times daily.    . Cholecalciferol 1000 UNITS tablet Take 1,000 Units by mouth daily.    . clopidogrel (PLAVIX) 75 MG tablet Take 1 tablet (75 mg total) by mouth daily.    . fluticasone (FLONASE) 50 MCG/ACT nasal spray Place 1 spray into both nostrils daily.    Marland Kitchen HYDROmorphone (DILAUDID) 4 MG tablet Take 1 tablet (4 mg total) by mouth every 4 (four) hours as needed for severe pain. 160 tablet 0  . levothyroxine (SYNTHROID, LEVOTHROID) 88 MCG tablet Take 88 mcg by mouth daily before breakfast.    . LORazepam (ATIVAN) 0.5 MG tablet Take one tablet 30 minutes prior to scan appt.  Repeat once if needed. 4 tablet 0  . Lutein 6 MG TABS Take 6 mg by mouth daily.    Marland Kitchen METRONIDAZOLE, TOPICAL, 0.75 % LOTN Apply 1 application topically daily.    . Multiple Vitamins-Minerals (MULTIVITAMIN WITH MINERALS) tablet Take 1 tablet by mouth daily.    . pantoprazole (PROTONIX) 40 MG tablet Take 40 mg by mouth 2 (two) times daily.    Vladimir Faster Glycol-Propyl Glycol (SYSTANE OP)  Apply 1 drop to eye 3 (three) times daily.    . polyethylene glycol (MIRALAX / GLYCOLAX) packet Take 17 g by mouth daily.    Marland Kitchen PROCTOSOL HC 2.5 % rectal cream Reported on 04/17/2015    . simvastatin (ZOCOR) 40 MG tablet Take 40 mg by mouth daily.    . traZODone (DESYREL) 50 MG tablet Take 50 mg by mouth at bedtime.    . vitamin E (VITAMIN E) 400 UNIT capsule Take 400 Units by mouth daily.    Marland Kitchen zinc gluconate 50 MG tablet Take 1 tablet (50 mg total) by mouth daily.     No current facility-administered medications for this visit.     OBJECTIVE: Middle-aged white woman In no acute distress Vitals:   01/09/16 1109  BP: (!) 153/67  Pulse: (!) 59  Resp: 18  Temp: 98.1 F (36.7 C)     Body mass index is 24.05 kg/m.    ECOG FS:2 - Symptomatic, <50% confined to bed  Sclerae unicteric, pupils round and equal Oropharynx clear and moist-- no thrush or other lesions No  cervical or supraclavicular adenopathy Lungs no rales or rhonchi Heart regular rate and rhythm Abd soft, nontender, positive bowel sounds MSK no focal spinal tenderness, left hip motion limited by pain Neuro: nonfocal, well oriented, appropriate affect Breasts: Status post right mastectomy. In the lower area where the breast was previously there is an incision which is healing well, with mild erythema around it. This is the area of course of the recent biopsy. Left breast is unremarkable.   LAB RESULTS: CMP     Component Value Date/Time   NA 141 01/09/2016 1037   K 4.0 01/09/2016 1037   CL 101 01/17/2011 0946   CO2 25 01/09/2016 1037   GLUCOSE 103 01/09/2016 1037   BUN 14.1 01/09/2016 1037   CREATININE 0.8 01/09/2016 1037   CALCIUM 9.2 01/09/2016 1037   PROT 7.2 01/09/2016 1037   ALBUMIN 4.1 01/09/2016 1037   AST 19 01/09/2016 1037   ALT 10 01/09/2016 1037   ALKPHOS 75 01/09/2016 1037   BILITOT 0.42 01/09/2016 1037    I No results found for: SPEP  Lab Results  Component Value Date   WBC 5.8 01/09/2016   NEUTROABS 4.4 01/09/2016   HGB 13.4 01/09/2016   HCT 41.2 01/09/2016   MCV 84.4 01/09/2016   PLT 237 01/09/2016      Chemistry      Component Value Date/Time   NA 141 01/09/2016 1037   K 4.0 01/09/2016 1037   CL 101 01/17/2011 0946   CO2 25 01/09/2016 1037   BUN 14.1 01/09/2016 1037   CREATININE 0.8 01/09/2016 1037      Component Value Date/Time   CALCIUM 9.2 01/09/2016 1037   ALKPHOS 75 01/09/2016 1037   AST 19 01/09/2016 1037   ALT 10 01/09/2016 1037   BILITOT 0.42 01/09/2016 1037      CA 27.29 0.0 - 38.6 U/mL 34.2 34.9CM         No results for input(s): INR in the last 168 hours.  Urinalysis No results found for: COLORURINE  STUDIES: No results found.   ASSESSMENT: 70 y.o. [place] woman with stage IV right breast cancer (bone metastatic disease)  (1) s/p Right mastectomy 1997 for an estrogen receptor and HER-2 positive breast cancer,  treated with  (a) adjuvant chemotherapy according to CALGB 9640 (taxotere x 4 cycles)  (b) high-dose chemotherapy (cyclophosphamide, carboplatin, BCNU) followed by stem cell rescue at [facility] 12/22/1995  (  c) biopsy-proven metastatic disease November 2007, estrogen receptor and HER-2 positive  (d) trastuzumab (dose? Duration?)  (e) tamoxifen for 5 years completed 2002  (f) participation in a vaccine study at [facility] 2003 (CEA primed dendritic cells, HER-2 primed dendritic cells)    (2) status post right iliac crest biopsy 10/10/2013 for invasive ductal carcinoma, grade 2, estrogen and progesterone receptor positive  (3) zolendronate started 10/12/2013,  repeated every 12 weeks  (a) dexa scan 07/28/2013 normal  (4) biopsy of a scalp lesion 10/17/2013 showed metastatic breast cancer, HER-2/neu negative  (a) biopsy of the right anterior chest wall nodule August 2017 shows adenocarcinoma  (5) anastrozole started 10/28/2013  (a) measurable disease = subcutaneous nodules in scalp and LLQ abdomen  (b)  CA-27-29 is informative  (c)  Repeat PET scan 12/28/2014 shows continuing response ; exam shows resolution of scalp lesions  (d) new skin lesion removed from right anterior chest wall August 2017    (6) neoplasia associated pain  (a) painful blastic lesion left femoral intertrochanteric region: Status post radiation completed November 2015  (b)  Pain left knee, status post TKR remotely  (c) left total hip replacement pending  PLAN: Kylah has a history of subcutaneous nodules, mostly in her scalp, which had resolved. She more recently developed a nodule in the right anterior chest. Aside from this area, she is clinically stable and we are going to continue her systemic therapy with anastrozole and zoledronate.  I think she would benefit from radiation to the right chest wall area and I have placed a referral to Dr. Orlene Erm regarding that.  She will be restaged with a PET scan in January and see me  shortly thereafter. At that time we will consider switching to letrozole/bulb palbociclib, while continuing zolendronate every 3 months (which she will receive today).  She understands if she is scheduled for hip replacement as expected in the next few months, she will need to be off the Plavix at least 5 days  She will call with any problems that may develop before her next visit here.  Chauncey Cruel, MD   01/10/2016 7:46 AM

## 2016-01-09 NOTE — Telephone Encounter (Signed)
Requested skin pathology reports from August from Dr Mancel Bale office. Requested brain mri, carotid doppler and records from [facility] pertaining to TIA in August.

## 2016-01-10 LAB — CANCER ANTIGEN 27.29: CA 27.29: 47.1 U/mL — ABNORMAL HIGH (ref 0.0–38.6)

## 2016-01-17 ENCOUNTER — Telehealth: Payer: Self-pay | Admitting: *Deleted

## 2016-01-17 NOTE — Telephone Encounter (Signed)
Pt called wanting to know status of referral to [facility] radiation.  Spoke with Katrina @ Dr. Lula Olszewski office.  Pt is not a new pt to the dept.  Pt was seen last Sept 2016.   Faxed office notes from Dr. Jana Hakim on 10/18 to [place].  Katrina stated she would contact pt with appts. Katrina's     Fax    [phone].

## 2016-01-23 DIAGNOSIS — C7951 Secondary malignant neoplasm of bone: Secondary | ICD-10-CM | POA: Diagnosis not present

## 2016-01-23 DIAGNOSIS — C7989 Secondary malignant neoplasm of other specified sites: Secondary | ICD-10-CM | POA: Diagnosis not present

## 2016-01-23 DIAGNOSIS — C50919 Malignant neoplasm of unspecified site of unspecified female breast: Secondary | ICD-10-CM | POA: Diagnosis not present

## 2016-01-24 ENCOUNTER — Ambulatory Visit: Payer: Self-pay | Admitting: Orthopedic Surgery

## 2016-01-24 DIAGNOSIS — C50919 Malignant neoplasm of unspecified site of unspecified female breast: Secondary | ICD-10-CM | POA: Diagnosis not present

## 2016-01-24 DIAGNOSIS — Z510 Encounter for antineoplastic radiation therapy: Secondary | ICD-10-CM | POA: Diagnosis not present

## 2016-01-24 DIAGNOSIS — C7951 Secondary malignant neoplasm of bone: Secondary | ICD-10-CM | POA: Diagnosis not present

## 2016-01-25 DIAGNOSIS — C50919 Malignant neoplasm of unspecified site of unspecified female breast: Secondary | ICD-10-CM | POA: Diagnosis not present

## 2016-01-25 DIAGNOSIS — C7951 Secondary malignant neoplasm of bone: Secondary | ICD-10-CM | POA: Diagnosis not present

## 2016-01-25 DIAGNOSIS — Z510 Encounter for antineoplastic radiation therapy: Secondary | ICD-10-CM | POA: Diagnosis not present

## 2016-01-28 ENCOUNTER — Other Ambulatory Visit: Payer: Self-pay | Admitting: Family Medicine

## 2016-01-28 DIAGNOSIS — C50919 Malignant neoplasm of unspecified site of unspecified female breast: Secondary | ICD-10-CM | POA: Diagnosis not present

## 2016-01-28 DIAGNOSIS — C7951 Secondary malignant neoplasm of bone: Secondary | ICD-10-CM | POA: Diagnosis not present

## 2016-01-28 DIAGNOSIS — Z1231 Encounter for screening mammogram for malignant neoplasm of breast: Secondary | ICD-10-CM

## 2016-01-28 DIAGNOSIS — Z510 Encounter for antineoplastic radiation therapy: Secondary | ICD-10-CM | POA: Diagnosis not present

## 2016-01-29 DIAGNOSIS — C50919 Malignant neoplasm of unspecified site of unspecified female breast: Secondary | ICD-10-CM | POA: Diagnosis not present

## 2016-01-29 DIAGNOSIS — Z510 Encounter for antineoplastic radiation therapy: Secondary | ICD-10-CM | POA: Diagnosis not present

## 2016-01-29 DIAGNOSIS — C7951 Secondary malignant neoplasm of bone: Secondary | ICD-10-CM | POA: Diagnosis not present

## 2016-01-30 DIAGNOSIS — C7951 Secondary malignant neoplasm of bone: Secondary | ICD-10-CM | POA: Diagnosis not present

## 2016-01-30 DIAGNOSIS — C50919 Malignant neoplasm of unspecified site of unspecified female breast: Secondary | ICD-10-CM | POA: Diagnosis not present

## 2016-01-30 DIAGNOSIS — Z510 Encounter for antineoplastic radiation therapy: Secondary | ICD-10-CM | POA: Diagnosis not present

## 2016-01-31 DIAGNOSIS — C50919 Malignant neoplasm of unspecified site of unspecified female breast: Secondary | ICD-10-CM | POA: Diagnosis not present

## 2016-01-31 DIAGNOSIS — C7951 Secondary malignant neoplasm of bone: Secondary | ICD-10-CM | POA: Diagnosis not present

## 2016-01-31 DIAGNOSIS — Z510 Encounter for antineoplastic radiation therapy: Secondary | ICD-10-CM | POA: Diagnosis not present

## 2016-01-31 DIAGNOSIS — E538 Deficiency of other specified B group vitamins: Secondary | ICD-10-CM | POA: Diagnosis not present

## 2016-02-01 DIAGNOSIS — C50919 Malignant neoplasm of unspecified site of unspecified female breast: Secondary | ICD-10-CM | POA: Diagnosis not present

## 2016-02-01 DIAGNOSIS — Z510 Encounter for antineoplastic radiation therapy: Secondary | ICD-10-CM | POA: Diagnosis not present

## 2016-02-01 DIAGNOSIS — C7951 Secondary malignant neoplasm of bone: Secondary | ICD-10-CM | POA: Diagnosis not present

## 2016-02-04 DIAGNOSIS — C7951 Secondary malignant neoplasm of bone: Secondary | ICD-10-CM | POA: Diagnosis not present

## 2016-02-04 DIAGNOSIS — C50919 Malignant neoplasm of unspecified site of unspecified female breast: Secondary | ICD-10-CM | POA: Diagnosis not present

## 2016-02-04 DIAGNOSIS — Z510 Encounter for antineoplastic radiation therapy: Secondary | ICD-10-CM | POA: Diagnosis not present

## 2016-02-05 DIAGNOSIS — C7951 Secondary malignant neoplasm of bone: Secondary | ICD-10-CM | POA: Diagnosis not present

## 2016-02-05 DIAGNOSIS — Z510 Encounter for antineoplastic radiation therapy: Secondary | ICD-10-CM | POA: Diagnosis not present

## 2016-02-05 DIAGNOSIS — C50919 Malignant neoplasm of unspecified site of unspecified female breast: Secondary | ICD-10-CM | POA: Diagnosis not present

## 2016-02-06 DIAGNOSIS — Z510 Encounter for antineoplastic radiation therapy: Secondary | ICD-10-CM | POA: Diagnosis not present

## 2016-02-06 DIAGNOSIS — C50919 Malignant neoplasm of unspecified site of unspecified female breast: Secondary | ICD-10-CM | POA: Diagnosis not present

## 2016-02-06 DIAGNOSIS — C7951 Secondary malignant neoplasm of bone: Secondary | ICD-10-CM | POA: Diagnosis not present

## 2016-02-07 DIAGNOSIS — Z510 Encounter for antineoplastic radiation therapy: Secondary | ICD-10-CM | POA: Diagnosis not present

## 2016-02-07 DIAGNOSIS — C50919 Malignant neoplasm of unspecified site of unspecified female breast: Secondary | ICD-10-CM | POA: Diagnosis not present

## 2016-02-07 DIAGNOSIS — C7951 Secondary malignant neoplasm of bone: Secondary | ICD-10-CM | POA: Diagnosis not present

## 2016-02-08 DIAGNOSIS — C7951 Secondary malignant neoplasm of bone: Secondary | ICD-10-CM | POA: Diagnosis not present

## 2016-02-08 DIAGNOSIS — Z510 Encounter for antineoplastic radiation therapy: Secondary | ICD-10-CM | POA: Diagnosis not present

## 2016-02-08 DIAGNOSIS — C50919 Malignant neoplasm of unspecified site of unspecified female breast: Secondary | ICD-10-CM | POA: Diagnosis not present

## 2016-02-09 DIAGNOSIS — C50919 Malignant neoplasm of unspecified site of unspecified female breast: Secondary | ICD-10-CM | POA: Diagnosis not present

## 2016-02-09 DIAGNOSIS — C7951 Secondary malignant neoplasm of bone: Secondary | ICD-10-CM | POA: Diagnosis not present

## 2016-02-09 DIAGNOSIS — Z510 Encounter for antineoplastic radiation therapy: Secondary | ICD-10-CM | POA: Diagnosis not present

## 2016-02-11 DIAGNOSIS — Z510 Encounter for antineoplastic radiation therapy: Secondary | ICD-10-CM | POA: Diagnosis not present

## 2016-02-11 DIAGNOSIS — C7951 Secondary malignant neoplasm of bone: Secondary | ICD-10-CM | POA: Diagnosis not present

## 2016-02-11 DIAGNOSIS — C50919 Malignant neoplasm of unspecified site of unspecified female breast: Secondary | ICD-10-CM | POA: Diagnosis not present

## 2016-02-12 DIAGNOSIS — C7951 Secondary malignant neoplasm of bone: Secondary | ICD-10-CM | POA: Diagnosis not present

## 2016-02-12 DIAGNOSIS — Z510 Encounter for antineoplastic radiation therapy: Secondary | ICD-10-CM | POA: Diagnosis not present

## 2016-02-12 DIAGNOSIS — C50919 Malignant neoplasm of unspecified site of unspecified female breast: Secondary | ICD-10-CM | POA: Diagnosis not present

## 2016-02-13 DIAGNOSIS — Z510 Encounter for antineoplastic radiation therapy: Secondary | ICD-10-CM | POA: Diagnosis not present

## 2016-02-13 DIAGNOSIS — C50919 Malignant neoplasm of unspecified site of unspecified female breast: Secondary | ICD-10-CM | POA: Diagnosis not present

## 2016-02-13 DIAGNOSIS — C7951 Secondary malignant neoplasm of bone: Secondary | ICD-10-CM | POA: Diagnosis not present

## 2016-02-18 DIAGNOSIS — C50919 Malignant neoplasm of unspecified site of unspecified female breast: Secondary | ICD-10-CM | POA: Diagnosis not present

## 2016-02-18 DIAGNOSIS — C7951 Secondary malignant neoplasm of bone: Secondary | ICD-10-CM | POA: Diagnosis not present

## 2016-02-18 DIAGNOSIS — Z510 Encounter for antineoplastic radiation therapy: Secondary | ICD-10-CM | POA: Diagnosis not present

## 2016-02-19 DIAGNOSIS — C50919 Malignant neoplasm of unspecified site of unspecified female breast: Secondary | ICD-10-CM | POA: Diagnosis not present

## 2016-02-19 DIAGNOSIS — C7951 Secondary malignant neoplasm of bone: Secondary | ICD-10-CM | POA: Diagnosis not present

## 2016-02-19 DIAGNOSIS — Z510 Encounter for antineoplastic radiation therapy: Secondary | ICD-10-CM | POA: Diagnosis not present

## 2016-02-20 DIAGNOSIS — C50919 Malignant neoplasm of unspecified site of unspecified female breast: Secondary | ICD-10-CM | POA: Diagnosis not present

## 2016-02-20 DIAGNOSIS — Z510 Encounter for antineoplastic radiation therapy: Secondary | ICD-10-CM | POA: Diagnosis not present

## 2016-02-20 DIAGNOSIS — C7951 Secondary malignant neoplasm of bone: Secondary | ICD-10-CM | POA: Diagnosis not present

## 2016-02-21 DIAGNOSIS — Z510 Encounter for antineoplastic radiation therapy: Secondary | ICD-10-CM | POA: Diagnosis not present

## 2016-02-21 DIAGNOSIS — M1612 Unilateral primary osteoarthritis, left hip: Secondary | ICD-10-CM | POA: Diagnosis not present

## 2016-02-21 DIAGNOSIS — C50919 Malignant neoplasm of unspecified site of unspecified female breast: Secondary | ICD-10-CM | POA: Diagnosis not present

## 2016-02-21 DIAGNOSIS — C7951 Secondary malignant neoplasm of bone: Secondary | ICD-10-CM | POA: Diagnosis not present

## 2016-02-22 DIAGNOSIS — C7951 Secondary malignant neoplasm of bone: Secondary | ICD-10-CM | POA: Diagnosis not present

## 2016-02-22 DIAGNOSIS — C50919 Malignant neoplasm of unspecified site of unspecified female breast: Secondary | ICD-10-CM | POA: Diagnosis not present

## 2016-02-22 DIAGNOSIS — Z510 Encounter for antineoplastic radiation therapy: Secondary | ICD-10-CM | POA: Diagnosis not present

## 2016-02-25 DIAGNOSIS — Z510 Encounter for antineoplastic radiation therapy: Secondary | ICD-10-CM | POA: Diagnosis not present

## 2016-02-25 DIAGNOSIS — C7951 Secondary malignant neoplasm of bone: Secondary | ICD-10-CM | POA: Diagnosis not present

## 2016-02-25 DIAGNOSIS — C50919 Malignant neoplasm of unspecified site of unspecified female breast: Secondary | ICD-10-CM | POA: Diagnosis not present

## 2016-02-26 ENCOUNTER — Other Ambulatory Visit: Payer: Self-pay | Admitting: Emergency Medicine

## 2016-02-26 DIAGNOSIS — C7951 Secondary malignant neoplasm of bone: Principal | ICD-10-CM

## 2016-02-26 DIAGNOSIS — C50911 Malignant neoplasm of unspecified site of right female breast: Secondary | ICD-10-CM

## 2016-02-26 DIAGNOSIS — E538 Deficiency of other specified B group vitamins: Secondary | ICD-10-CM | POA: Diagnosis not present

## 2016-02-26 MED ORDER — HYDROMORPHONE HCL 4 MG PO TABS: 4.0000 mg | ORAL_TABLET | ORAL | 0 refills | Status: DC | PRN

## 2016-03-03 ENCOUNTER — Ambulatory Visit
Admission: RE | Admit: 2016-03-03 | Discharge: 2016-03-03 | Disposition: A | Discharge status: Home / Self Care | Payer: Medicare Other | Source: Ambulatory Visit | Attending: Family Medicine | Admitting: Family Medicine

## 2016-03-03 DIAGNOSIS — Z1231 Encounter for screening mammogram for malignant neoplasm of breast: Secondary | ICD-10-CM | POA: Diagnosis not present

## 2016-03-04 ENCOUNTER — Other Ambulatory Visit (HOSPITAL_COMMUNITY): Payer: Self-pay | Admitting: *Deleted

## 2016-03-04 NOTE — Patient Instructions (Addendum)
Jade Mooney  03/04/2016   Your procedure is scheduled on: 03-12-16  Report to [facility]  Entrance take [facility]  elevators to 3rd floor to  [place] at 745 AM.  Call this number if you have problems the morning of surgery [phone]   Remember: ONLY 1 PERSON MAY GO WITH YOU TO SHORT STAY TO GET  READY MORNING OF [place].  Do not eat food or drink liquids :After Midnight.     Take these medicines the morning of surgery with A SIP OF WATER: ANASTROZOLE ,  FLONASE NASAL SPRAY, DILAUDID,  SYNTHROID                               You may not have any metal on your body including hair pins and              piercings  Do not wear jewelry, make-up, lotions, powders or perfumes, deodorant             Do not wear nail polish.  Do not shave  48 hours prior to surgery.              Men may shave face and neck.   Do not bring valuables to the hospital. [place].  Contacts, dentures or bridgework may not be worn into surgery.  Leave suitcase in the car. After surgery it may be brought to your room.     Patients discharged the day of surgery will not be allowed to drive home.  Name and phone number of your driver:  Special Instructions: N/A              Please read over the following fact sheets you were given: _____________________________________________________________________             [facility] - Preparing for Surgery Before surgery, you can play an important role.  Because skin is not sterile, your skin needs to be as free of germs as possible.  You can reduce the number of germs on your skin by washing with CHG (chlorahexidine gluconate) soap before surgery.  CHG is an antiseptic cleaner which kills germs and bonds with the skin to continue killing germs even after washing. Please DO NOT use if you have an allergy to CHG or antibacterial soaps.  If your skin becomes reddened/irritated  stop using the CHG and inform your nurse when you arrive at Short Stay. Do not shave (including legs and underarms) for at least 48 hours prior to the first CHG shower.  You may shave your face/neck. Please follow these instructions carefully:  1.  Shower with CHG Soap the night before surgery and the  morning of Surgery.  2.  If you choose to wash your hair, wash your hair first as usual with your  normal  shampoo.  3.  After you shampoo, rinse your hair and body thoroughly to remove the  shampoo.                           4.  Use CHG as you would any other liquid soap.  You can apply chg directly  to the skin and wash  Gently with a scrungie or clean washcloth.  5.  Apply the CHG Soap to your body ONLY FROM THE NECK DOWN.   Do not use on face/ open                           Wound or open sores. Avoid contact with eyes, ears mouth and genitals (private parts).                       Wash face,  Genitals (private parts) with your normal soap.             6.  Wash thoroughly, paying special attention to the area where your surgery  will be performed.  7.  Thoroughly rinse your body with warm water from the neck down.  8.  DO NOT shower/wash with your normal soap after using and rinsing off  the CHG Soap.                9.  Pat yourself dry with a clean towel.            10.  Wear clean pajamas.            11.  Place clean sheets on your bed the night of your first shower and do not  sleep with pets. Day of Surgery : Do not apply any lotions/deodorants the morning of surgery.  Please wear clean clothes to the hospital/surgery center.  FAILURE TO FOLLOW THESE INSTRUCTIONS MAY RESULT IN THE CANCELLATION OF YOUR SURGERY PATIENT SIGNATURE_________________________________  NURSE SIGNATURE__________________________________  ________________________________________________________________________   Adam Phenix  An incentive spirometer is a tool that can help keep your  lungs clear and active. This tool measures how well you are filling your lungs with each breath. Taking long deep breaths may help reverse or decrease the chance of developing breathing (pulmonary) problems (especially infection) following:  A long period of time when you are unable to move or be active. BEFORE THE PROCEDURE   If the spirometer includes an indicator to show your best effort, your nurse or respiratory therapist will set it to a desired goal.  If possible, sit up straight or lean slightly forward. Try not to slouch.  Hold the incentive spirometer in an upright position. INSTRUCTIONS FOR USE  1. Sit on the edge of your bed if possible, or sit up as far as you can in bed or on a chair. 2. Hold the incentive spirometer in an upright position. 3. Breathe out normally. 4. Place the mouthpiece in your mouth and seal your lips tightly around it. 5. Breathe in slowly and as deeply as possible, raising the piston or the ball toward the top of the column. 6. Hold your breath for 3-5 seconds or for as long as possible. Allow the piston or ball to fall to the bottom of the column. 7. Remove the mouthpiece from your mouth and breathe out normally. 8. Rest for a few seconds and repeat Steps 1 through 7 at least 10 times every 1-2 hours when you are awake. Take your time and take a few normal breaths between deep breaths. 9. The spirometer may include an indicator to show your best effort. Use the indicator as a goal to work toward during each repetition. 10. After each set of 10 deep breaths, practice coughing to be sure your lungs are clear. If you have an incision (the cut made at the time of surgery),  support your incision when coughing by placing a pillow or rolled up towels firmly against it. Once you are able to get out of bed, walk around indoors and cough well. You may stop using the incentive spirometer when instructed by your caregiver.  RISKS AND COMPLICATIONS  Take your time so  you do not get dizzy or light-headed.  If you are in pain, you may need to take or ask for pain medication before doing incentive spirometry. It is harder to take a deep breath if you are having pain. AFTER USE  Rest and breathe slowly and easily.  It can be helpful to keep track of a log of your progress. Your caregiver can provide you with a simple table to help with this. If you are using the spirometer at home, follow these instructions: [place] IF:   You are having difficultly using the spirometer.  You have trouble using the spirometer as often as instructed.  Your pain medication is not giving enough relief while using the spirometer.  You develop fever of 100.5 F (38.1 C) or higher. SEEK IMMEDIATE MEDICAL CARE IF:   You cough up bloody sputum that had not been present before.  You develop fever of 102 F (38.9 C) or greater.  You develop worsening pain at or near the incision site. MAKE SURE YOU:   Understand these instructions.  Will watch your condition.  Will get help right away if you are not doing well or get worse. Document Released: 07/21/2006 Document Revised: 06/02/2011 Document Reviewed: 09/21/2006 ExitCare Patient Information 2014 ExitCare, [place].   ________________________________________________________________________  WHAT IS A BLOOD TRANSFUSION? Blood Transfusion Information  A transfusion is the replacement of blood or some of its parts. Blood is made up of multiple cells which provide different functions.  Red blood cells carry oxygen and are used for blood loss replacement.  White blood cells fight against infection.  Platelets control bleeding.  Plasma helps clot blood.  Other blood products are available for specialized needs, such as hemophilia or other clotting disorders. BEFORE THE TRANSFUSION  Who gives blood for transfusions?   Healthy volunteers who are fully evaluated to make sure their blood is safe. This is blood  bank blood. Transfusion therapy is the safest it has ever been in the practice of medicine. Before blood is taken from a donor, a complete history is taken to make sure that person has no history of diseases nor engages in risky social behavior (examples are intravenous drug use or sexual activity with multiple partners). The donor's travel history is screened to minimize risk of transmitting infections, such as malaria. The donated blood is tested for signs of infectious diseases, such as HIV and hepatitis. The blood is then tested to be sure it is compatible with you in order to minimize the chance of a transfusion reaction. If you or a relative donates blood, this is often done in anticipation of surgery and is not appropriate for emergency situations. It takes many days to process the donated blood. RISKS AND COMPLICATIONS Although transfusion therapy is very safe and saves many lives, the main dangers of transfusion include:   Getting an infectious disease.  Developing a transfusion reaction. This is an allergic reaction to something in the blood you were given. Every precaution is taken to prevent this. The decision to have a blood transfusion has been considered carefully by your caregiver before blood is given. Blood is not given unless the benefits outweigh the risks. AFTER THE TRANSFUSION  Right after receiving a blood transfusion, you will usually feel much better and more energetic. This is especially true if your red blood cells have gotten low (anemic). The transfusion raises the level of the red blood cells which carry oxygen, and this usually causes an energy increase.  The nurse administering the transfusion will monitor you carefully for complications. HOME CARE INSTRUCTIONS  No special instructions are needed after a transfusion. You may find your energy is better. Speak with your caregiver about any limitations on activity for underlying diseases you may have. SEEK MEDICAL CARE  IF:   Your condition is not improving after your transfusion.  You develop redness or irritation at the intravenous (IV) site. SEEK IMMEDIATE MEDICAL CARE IF:  Any of the following symptoms occur over the next 12 hours:  Shaking chills.  You have a temperature by mouth above 102 F (38.9 C), not controlled by medicine.  Chest, back, or muscle pain.  People around you feel you are not acting correctly or are confused.  Shortness of breath or difficulty breathing.  Dizziness and fainting.  You get a rash or develop hives.  You have a decrease in urine output.  Your urine turns a dark color or changes to pink, red, or brown. Any of the following symptoms occur over the next 10 days:  You have a temperature by mouth above 102 F (38.9 C), not controlled by medicine.  Shortness of breath.  Weakness after normal activity.  The white part of the eye turns yellow (jaundice).  You have a decrease in the amount of urine or are urinating less often.  Your urine turns a dark color or changes to pink, red, or brown. Document Released: 03/07/2000 Document Revised: 06/02/2011 Document Reviewed: 10/25/2007 [facility] Patient Information 2014 [place], [place].  _______________________________________________________________________

## 2016-03-04 NOTE — Progress Notes (Signed)
[facility] 01-23-16 ON CHART 1 VIEW CHEST XRAY 11-08-15 [place] ON CHART EKG 11-08-15 [place] ON CHART CLEARANCE DR Nicki Reaper ON CHART

## 2016-03-05 ENCOUNTER — Encounter (HOSPITAL_COMMUNITY)
Admission: RE | Admit: 2016-03-05 | Discharge: 2016-03-05 | Disposition: A | Discharge status: Home / Self Care | Payer: Medicare Other | Source: Ambulatory Visit | Attending: Orthopedic Surgery | Admitting: Orthopedic Surgery

## 2016-03-05 ENCOUNTER — Encounter (HOSPITAL_COMMUNITY): Payer: Self-pay

## 2016-03-05 DIAGNOSIS — M1612 Unilateral primary osteoarthritis, left hip: Secondary | ICD-10-CM | POA: Insufficient documentation

## 2016-03-05 DIAGNOSIS — Z01812 Encounter for preprocedural laboratory examination: Secondary | ICD-10-CM | POA: Diagnosis not present

## 2016-03-05 HISTORY — DX: Anxiety disorder, unspecified: F41.9

## 2016-03-05 HISTORY — DX: Essential (primary) hypertension: I10

## 2016-03-05 LAB — PROTIME-INR
INR: 0.99
Prothrombin Time: 13.1 seconds (ref 11.4–15.2)

## 2016-03-05 LAB — COMPREHENSIVE METABOLIC PANEL
ALT: 11 U/L — ABNORMAL LOW (ref 14–54)
AST: 24 U/L (ref 15–41)
Albumin: 4.6 g/dL (ref 3.5–5.0)
Alkaline Phosphatase: 63 U/L (ref 38–126)
Anion gap: 9 (ref 5–15)
BUN: 17 mg/dL (ref 6–20)
CO2: 27 mmol/L (ref 22–32)
Calcium: 8.9 mg/dL (ref 8.9–10.3)
Chloride: 100 mmol/L — ABNORMAL LOW (ref 101–111)
Creatinine, Ser: 0.7 mg/dL (ref 0.44–1.00)
GFR calc Af Amer: >60 mL/min (ref >60)
GFR calc non Af Amer: >60 mL/min (ref >60)
Glucose, Bld: 90 mg/dL (ref 65–99)
Potassium: 4.5 mmol/L (ref 3.5–5.1)
Sodium: 136 mmol/L (ref 135–145)
Total Bilirubin: 0.5 mg/dL (ref 0.3–1.2)
Total Protein: 6.9 g/dL (ref 6.5–8.1)

## 2016-03-05 LAB — URINALYSIS, ROUTINE W REFLEX MICROSCOPIC
Bilirubin Urine: NEGATIVE
Glucose, UA: NEGATIVE mg/dL
Hgb urine dipstick: NEGATIVE
Ketones, ur: NEGATIVE mg/dL
Leukocytes, UA: NEGATIVE
Nitrite: NEGATIVE
Protein, ur: NEGATIVE mg/dL
Specific Gravity, Urine: 1.005 (ref 1.005–1.030)
pH: 7 (ref 5.0–8.0)

## 2016-03-05 LAB — CBC
HCT: 39 % (ref 36.0–46.0)
Hemoglobin: 12.6 g/dL (ref 12.0–15.0)
MCH: 27.6 pg (ref 26.0–34.0)
MCHC: 32.3 g/dL (ref 30.0–36.0)
MCV: 85.3 fL (ref 78.0–100.0)
Platelets: 220 10*3/uL (ref 150–400)
RBC: 4.57 MIL/uL (ref 3.87–5.11)
RDW: 15.1 % (ref 11.5–15.5)
WBC: 6.2 10*3/uL (ref 4.0–10.5)

## 2016-03-05 LAB — APTT: aPTT: 31 seconds (ref 24–36)

## 2016-03-05 LAB — SURGICAL PCR SCREEN
MRSA, PCR: NEGATIVE
Staphylococcus aureus: NEGATIVE

## 2016-03-10 ENCOUNTER — Other Ambulatory Visit: Payer: Self-pay | Admitting: Oncology

## 2016-03-10 NOTE — Progress Notes (Unsigned)
Received a note from more thorough staging that she completed her 20 treatments of radiation 12 for 2017 at [facility] and is scheduled for hip replacement 03/12/2016.

## 2016-03-11 ENCOUNTER — Ambulatory Visit: Payer: Self-pay | Admitting: Orthopedic Surgery

## 2016-03-11 NOTE — H&P (Signed)
Jade Mooney DOB: [birth year] Married / Language: English / Race: White Female Date of Admission:  03/12/2016 CC:  Left hip pain History of Present Illness  The patient is a 70 year old female who comes in  for a preoperative History and Physical. The patient is scheduled for a left total hip arthroplasty (anterior) to be performed by Dr. Dione Plover. Aluisio, MD at [facility] on 03-12-2016. The patient reports left hip problems including pain symptoms that have been present for 5 year(s). The symptoms began without any known injury. Patient states that she has constant pain on the lateral side of her left hip. She does have some groin pain at times. She said that moving or raising her leg causes the hip to hurt more. She takes Dilaudid to help her get through the day. She had a knee replacement on the left side with Dr.Collins in 2007. She states that her knee hurts as well, but wonders if that is coming from her hip. She has had progressively worsening left hip pain for close to a year now. It has gotten much worse in the past several months. Dr. Theda Sers replaced her left knee in 2007 and this pain initially started in the knee, but is now occurring more in the groin and radiating to the knee. She is not having any swelling or mechanical type symptoms in the knee. Pain in the hip is worse with weightbearing, but is also now occurring at rest. It is limiting what she can and cannot do. She would like to be more active, but cannot do so because of this pain in the left leg. AP pelvis, AP and lateral of the left hip show advanced end-stage arthritic change of that hip. She has bone on bone with subchondral cyst. Right hip is unremarkable. She has advanced end-stage arthritis. She has had progressive pain and dysfunction. At this point, the most predictable means of improving her pain and function is total hip arthroplasty. The procedure, risks, potential complications and rehab course are  discussed in detail and the patient elects to proceed. The goals of this procedure are decreased pain and increased function. There is a high liklihood that both of these goals will be achieved. They have been treated conservatively in the past for the above stated problem and despite conservative measures, they continue to have progressive pain and severe functional limitations and dysfunction. They have failed non-operative management including home exercise, medications. It is felt that they would benefit from undergoing total joint replacement. Risks and benefits of the procedure have been discussed with the patient and they elect to proceed with surgery. There are no active contraindications to surgery such as ongoing infection or rapidly progressive neurological disease.  Problem List/Past Medical History of total left knee replacement [id number])  Primary osteoarthritis of left hip (M16.12)  Breast Cancer  Cerebrovascular Accident  TIA Diverticulitis Of Colon  Gastroesophageal Reflux Disease  Hypercholesterolemia  Hyperthyroidism  Macular Degeneration  Allergies Percocet *ANALGESICS - OPIOID*  Percodan *ANALGESICS - OPIOID*  Amoxicillin *PENICILLINS*  whelps  Family History  Cerebrovascular Accident  mother Diabetes Mellitus  sister Heart Disease  father Hypertension  father  Social History Alcohol use  current drinker; drinks wine; less than 5 per week Children  0 Current work status  retired Engineer, agricultural (Currently)  no Drug/Alcohol Rehab (Previously)  no Exercise  Exercises weekly; does gym / weights Illicit drug use  no Living situation  live with spouse Marital status  married Number of  flights of stairs before winded  4-5 Pain Contract  no Tobacco / smoke exposure  no Tobacco use  former smoker; smoke(d) 1 pack(s) per day Advance Directives  Living Will, Healthcare POA  Medication History Voltaren (1% Gel, 4 gram Transdermal  qid to affected area, Taken starting 03/09/2015) Active. Pennsaid (2% Solution, 2 (two) Pump Transdermal apply to knee bid, Taken starting 02/23/2015) Active. Flonase (50MCG/ACT Suspension, Nasal) Active. Anastrozole (1MG  Tablet, Oral) Active. HYDROmorphone HCl (4MG  Tablet, Oral) Active. TraZODone HCl (50MG  Tablet, Oral) Active. Calcium Citrate (500MG  Capsule, Oral) Active. Multi Vitamin Active. Vitamin E Active. Vitamin C Active. Lutein Active. Glucosamine Chondroitin Active. Beta Carotene Active. Tylenol Active. Systane ultra Active. Miralax Active. Vitamin D3 (Oral) Specific strength unknown - Active. Naproxen (Oral) Specific strength unknown - Active. Allergy Eye Drops Active. Zoledronic Acid (4MG  For Solution, Intravenous) Active. Claritin Active. Synthroid (88MCG Tablet, Oral) Active. Simvastatin (40MG  Tablet, Oral) Active. MetroNIDAZOLE (0.75% Cream, External) Active. Aspirin (325MG  Tablet, Oral) Active. Pantoprazole Sodium (40MG  Tablet DR, Oral) Active.  Past Surgical History Arthroscopy of Knee  right Breast Biopsy  left Cataract Surgery  bilateral Colon Polyp Removal - Colonoscopy  Mastectomy - Bilateral  right Tonsillectomy  Total Knee Replacement  left Tubal Ligation      Review of Systems  General Present- Night Sweats (secondary to medications). Not Present- Chills, Fatigue, Fever, Memory Loss, Weight Gain and Weight Loss. Skin Not Present- Eczema, Hives, Itching, Lesions and Rash. HEENT Not Present- Dentures, Double Vision, Headache, Hearing Loss, Tinnitus and Visual Loss. Respiratory Not Present- Allergies, Chronic Cough, Coughing up blood, Shortness of breath at rest and Shortness of breath with exertion. Cardiovascular Not Present- Chest Pain, Difficulty Breathing Lying Down, Murmur, Palpitations, Racing/skipping heartbeats and Swelling. Gastrointestinal Not Present- Abdominal Pain, Bloody Stool, Constipation, Diarrhea,  Difficulty Swallowing, Heartburn, Jaundice, Loss of appetitie, Nausea and Vomiting. Female Genitourinary Not Present- Blood in Urine, Discharge, Flank Pain, Incontinence, Painful Urination, Urgency, Urinary frequency, Urinary Retention, Urinating at Night and Weak urinary stream. Musculoskeletal Present- Back Pain, Joint Pain and Morning Stiffness. Not Present- Joint Swelling, Muscle Pain, Muscle Weakness and Spasms. Neurological Not Present- Blackout spells, Difficulty with balance, Dizziness, Paralysis, Tremor and Weakness. Psychiatric Not Present- Insomnia.  Vitals Weight: 137 lb Height: 63.5in Body Surface Area: 1.66 m Body Mass Index: 23.89 kg/m  Pulse: 64 (Regular)  BP: 128/78 (Sitting, Right Arm, Standard)   Physical Exam  General Mental Status -Alert, cooperative and good historian. General Appearance-pleasant, Not in acute distress. Orientation-Oriented X3. Build & Nutrition-Well nourished and Well developed.  Head and Neck Head-normocephalic, atraumatic . Neck Global Assessment - supple, no bruit auscultated on the right, no bruit auscultated on the left.  Eye Vision-Wears corrective lenses. Pupil - Bilateral-Regular and Round. Motion - Bilateral-EOMI.  Chest and Lung Exam Auscultation Breath sounds - clear at anterior chest wall and clear at posterior chest wall. Adventitious sounds - No Adventitious sounds.  Cardiovascular Auscultation Rhythm - Regular rate and rhythm. Heart Sounds - S1 WNL and S2 WNL. Murmurs & Other Heart Sounds - Auscultation of the heart reveals - No Murmurs.  Abdomen Palpation/Percussion Tenderness - Abdomen is non-tender to palpation. Rigidity (guarding) - Abdomen is soft. Auscultation Auscultation of the abdomen reveals - Bowel sounds normal.  Female Genitourinary Note: Not done, not pertinent to present illness   Musculoskeletal Note: Well-developed female, in no distress. Right hip has normal motion,  no discomfort. Left hip can be flexed to about 95, minimal internal rotation, 10 to 20 of external rotation, 20 abduction.  Her left knee shows no effusion. Range of motion of the left knee is about 0 to 115. She does not have any medial or lateral joint line tenderness. There is no instability.  RADIOGRAPHS AP pelvis, AP and lateral of the left hip show advanced end-stage arthritic change of that hip. She has bone on bone with subchondral cyst. Right hip is unremarkable.  Assessment & Plan History of total left knee replacement [id number]) Primary osteoarthritis of left hip (M16.12)  Note:Surgical Plans: Left Total Hip Replacement - Anterior Approach  Disposition: Home  PCP: Dr. Ann Held - Patient has been seen preoperatively and felt to be stable for surgery. Oncology: Dr. Gunnar Bulla Magrinat  Topical TXA - TIA, Breast Cancer  Anesthesia Issues: None  AVOID THE RIGHT ARM FOR BPS AND IVS  Signed electronically by Ok Edwards, III PA-C

## 2016-03-12 ENCOUNTER — Inpatient Hospital Stay (HOSPITAL_COMMUNITY): Payer: Medicare Other

## 2016-03-12 ENCOUNTER — Encounter (HOSPITAL_COMMUNITY)
Admission: RE | Disposition: A | Discharge status: Home / Self Care | Payer: Self-pay | Source: Ambulatory Visit | Attending: Orthopedic Surgery

## 2016-03-12 ENCOUNTER — Inpatient Hospital Stay (HOSPITAL_COMMUNITY): Payer: Medicare Other | Admitting: Anesthesiology

## 2016-03-12 ENCOUNTER — Inpatient Hospital Stay (HOSPITAL_COMMUNITY)
Admission: RE | Admit: 2016-03-12 | Discharge: 2016-03-14 | DRG: 470 | Disposition: A | Discharge status: Home / Self Care | Payer: Medicare Other | Source: Ambulatory Visit | Attending: Orthopedic Surgery | Admitting: Orthopedic Surgery

## 2016-03-12 ENCOUNTER — Encounter (HOSPITAL_COMMUNITY): Payer: Self-pay | Admitting: *Deleted

## 2016-03-12 DIAGNOSIS — M1612 Unilateral primary osteoarthritis, left hip: Principal | ICD-10-CM | POA: Diagnosis present

## 2016-03-12 DIAGNOSIS — E7800 Pure hypercholesterolemia, unspecified: Secondary | ICD-10-CM | POA: Diagnosis present

## 2016-03-12 DIAGNOSIS — H3530 Unspecified macular degeneration: Secondary | ICD-10-CM | POA: Diagnosis present

## 2016-03-12 DIAGNOSIS — E0590 Thyrotoxicosis, unspecified without thyrotoxic crisis or storm: Secondary | ICD-10-CM | POA: Diagnosis present

## 2016-03-12 DIAGNOSIS — I10 Essential (primary) hypertension: Secondary | ICD-10-CM | POA: Diagnosis present

## 2016-03-12 DIAGNOSIS — K219 Gastro-esophageal reflux disease without esophagitis: Secondary | ICD-10-CM | POA: Diagnosis present

## 2016-03-12 DIAGNOSIS — M169 Osteoarthritis of hip, unspecified: Secondary | ICD-10-CM

## 2016-03-12 DIAGNOSIS — Z888 Allergy status to other drugs, medicaments and biological substances status: Secondary | ICD-10-CM | POA: Diagnosis not present

## 2016-03-12 DIAGNOSIS — Z880 Allergy status to penicillin: Secondary | ICD-10-CM | POA: Diagnosis not present

## 2016-03-12 DIAGNOSIS — Z96642 Presence of left artificial hip joint: Secondary | ICD-10-CM | POA: Diagnosis not present

## 2016-03-12 DIAGNOSIS — F419 Anxiety disorder, unspecified: Secondary | ICD-10-CM | POA: Diagnosis present

## 2016-03-12 DIAGNOSIS — Z79899 Other long term (current) drug therapy: Secondary | ICD-10-CM | POA: Diagnosis not present

## 2016-03-12 DIAGNOSIS — Z96649 Presence of unspecified artificial hip joint: Secondary | ICD-10-CM

## 2016-03-12 DIAGNOSIS — Z853 Personal history of malignant neoplasm of breast: Secondary | ICD-10-CM

## 2016-03-12 DIAGNOSIS — E039 Hypothyroidism, unspecified: Secondary | ICD-10-CM | POA: Diagnosis present

## 2016-03-12 DIAGNOSIS — Z885 Allergy status to narcotic agent status: Secondary | ICD-10-CM | POA: Diagnosis not present

## 2016-03-12 DIAGNOSIS — Z471 Aftercare following joint replacement surgery: Secondary | ICD-10-CM | POA: Diagnosis not present

## 2016-03-12 DIAGNOSIS — Z8673 Personal history of transient ischemic attack (TIA), and cerebral infarction without residual deficits: Secondary | ICD-10-CM

## 2016-03-12 HISTORY — PX: TOTAL HIP ARTHROPLASTY: SHX124

## 2016-03-12 LAB — TYPE AND SCREEN
ABO/RH(D): O POS
Antibody Screen: NEGATIVE

## 2016-03-12 SURGERY — ARTHROPLASTY, HIP, TOTAL, ANTERIOR APPROACH
Anesthesia: Spinal | Site: Hip | Laterality: Left

## 2016-03-12 MED ORDER — DOCUSATE SODIUM 100 MG PO CAPS
100.0000 mg | ORAL_CAPSULE | Freq: Two times a day (BID) | ORAL | Status: DC
  Administered 2016-03-12 – 2016-03-14 (×4): 100 mg via ORAL
  Filled 2016-03-12 (×4): qty 1

## 2016-03-12 MED ORDER — HYDROMORPHONE HCL 1 MG/ML IJ SOLN
0.5000 mg | INTRAMUSCULAR | Status: DC | PRN
  Administered 2016-03-12: 15:00:00 0.5 mg via INTRAVENOUS
  Filled 2016-03-12: qty 1

## 2016-03-12 MED ORDER — TRAZODONE HCL 50 MG PO TABS
25.0000 mg | ORAL_TABLET | Freq: Every day | ORAL | Status: DC
  Administered 2016-03-12 – 2016-03-13 (×2): 25 mg via ORAL
  Filled 2016-03-12 (×2): qty 1

## 2016-03-12 MED ORDER — PHENYLEPHRINE HCL 10 MG/ML IJ SOLN
INTRAMUSCULAR | Status: DC | PRN
  Administered 2016-03-12: 120 ug via INTRAVENOUS
  Administered 2016-03-12 (×2): 80 ug via INTRAVENOUS

## 2016-03-12 MED ORDER — LOSARTAN POTASSIUM 25 MG PO TABS
25.0000 mg | ORAL_TABLET | Freq: Every day | ORAL | Status: DC
  Administered 2016-03-13 – 2016-03-14 (×2): 25 mg via ORAL
  Filled 2016-03-12 (×2): qty 1

## 2016-03-12 MED ORDER — SODIUM CHLORIDE 0.9 % IV SOLN
INTRAVENOUS | Status: DC
  Administered 2016-03-12: 15:00:00 via INTRAVENOUS

## 2016-03-12 MED ORDER — PHENOL 1.4 % MT LIQD: 1.0000 | OROMUCOSAL | Status: DC | PRN

## 2016-03-12 MED ORDER — HYDROMORPHONE HCL 1 MG/ML IJ SOLN
0.2500 mg | INTRAMUSCULAR | Status: DC | PRN
  Administered 2016-03-12: 0.5 mg via INTRAVENOUS

## 2016-03-12 MED ORDER — VANCOMYCIN HCL IN DEXTROSE 1-5 GM/200ML-% IV SOLN
1000.0000 mg | Freq: Once | INTRAVENOUS | Status: AC
  Administered 2016-03-12: 1000 mg via INTRAVENOUS
  Filled 2016-03-12: qty 200

## 2016-03-12 MED ORDER — HYDROMORPHONE HCL 2 MG PO TABS
2.0000 mg | ORAL_TABLET | ORAL | Status: DC | PRN
  Administered 2016-03-12 – 2016-03-14 (×11): 4 mg via ORAL
  Filled 2016-03-12 (×12): qty 2

## 2016-03-12 MED ORDER — ONDANSETRON HCL 4 MG/2ML IJ SOLN: 4.0000 mg | Freq: Four times a day (QID) | INTRAMUSCULAR | Status: DC | PRN

## 2016-03-12 MED ORDER — PROPOFOL 500 MG/50ML IV EMUL
INTRAVENOUS | Status: DC | PRN
  Administered 2016-03-12: 100 ug/kg/min via INTRAVENOUS

## 2016-03-12 MED ORDER — ONDANSETRON HCL 4 MG/2ML IJ SOLN
INTRAMUSCULAR | Status: DC | PRN
  Administered 2016-03-12: 4 mg via INTRAVENOUS

## 2016-03-12 MED ORDER — LACTATED RINGERS IV SOLN
INTRAVENOUS | Status: DC
  Administered 2016-03-12 (×2): via INTRAVENOUS

## 2016-03-12 MED ORDER — CEFAZOLIN SODIUM-DEXTROSE 2-4 GM/100ML-% IV SOLN: 2.0000 g | Freq: Four times a day (QID) | INTRAVENOUS | Status: DC

## 2016-03-12 MED ORDER — FENTANYL CITRATE (PF) 100 MCG/2ML IJ SOLN
INTRAMUSCULAR | Status: DC | PRN
  Administered 2016-03-12: 100 ug via INTRAVENOUS

## 2016-03-12 MED ORDER — ACETAMINOPHEN 10 MG/ML IV SOLN
1000.0000 mg | Freq: Once | INTRAVENOUS | Status: AC
  Administered 2016-03-12: 1000 mg via INTRAVENOUS

## 2016-03-12 MED ORDER — METOCLOPRAMIDE HCL 5 MG PO TABS
5.0000 mg | ORAL_TABLET | Freq: Three times a day (TID) | ORAL | Status: DC | PRN
  Filled 2016-03-12: qty 2

## 2016-03-12 MED ORDER — ANASTROZOLE 1 MG PO TABS
1.0000 mg | ORAL_TABLET | Freq: Every day | ORAL | Status: DC
  Administered 2016-03-13 – 2016-03-14 (×2): 1 mg via ORAL
  Filled 2016-03-12 (×2): qty 1

## 2016-03-12 MED ORDER — DIPHENHYDRAMINE HCL 12.5 MG/5ML PO ELIX: 12.5000 mg | ORAL_SOLUTION | ORAL | Status: DC | PRN

## 2016-03-12 MED ORDER — BISACODYL 10 MG RE SUPP: 10.0000 mg | Freq: Every day | RECTAL | Status: DC | PRN

## 2016-03-12 MED ORDER — ACETAMINOPHEN 650 MG RE SUPP: 650.0000 mg | Freq: Four times a day (QID) | RECTAL | Status: DC | PRN

## 2016-03-12 MED ORDER — FENTANYL CITRATE (PF) 100 MCG/2ML IJ SOLN
INTRAMUSCULAR | Status: AC
  Filled 2016-03-12: qty 2

## 2016-03-12 MED ORDER — PHENYLEPHRINE 40 MCG/ML (10ML) SYRINGE FOR IV PUSH (FOR BLOOD PRESSURE SUPPORT)
PREFILLED_SYRINGE | INTRAVENOUS | Status: AC
  Filled 2016-03-12: qty 10

## 2016-03-12 MED ORDER — MECLIZINE HCL 25 MG PO TABS
25.0000 mg | ORAL_TABLET | Freq: Three times a day (TID) | ORAL | Status: DC | PRN
  Filled 2016-03-12: qty 1

## 2016-03-12 MED ORDER — POLYETHYLENE GLYCOL 3350 17 G PO PACK: 17.0000 g | PACK | Freq: Every day | ORAL | Status: DC | PRN

## 2016-03-12 MED ORDER — TRANEXAMIC ACID 1000 MG/10ML IV SOLN
INTRAVENOUS | Status: DC | PRN
  Administered 2016-03-12: 2000 mg via TOPICAL

## 2016-03-12 MED ORDER — BUPIVACAINE IN DEXTROSE 0.75-8.25 % IT SOLN
INTRATHECAL | Status: DC | PRN
  Administered 2016-03-12: 1.8 mL via INTRATHECAL

## 2016-03-12 MED ORDER — MEPERIDINE HCL 50 MG/ML IJ SOLN: 6.2500 mg | INTRAMUSCULAR | Status: DC | PRN

## 2016-03-12 MED ORDER — DEXAMETHASONE SODIUM PHOSPHATE 10 MG/ML IJ SOLN
INTRAMUSCULAR | Status: AC
  Filled 2016-03-12: qty 1

## 2016-03-12 MED ORDER — SIMVASTATIN 20 MG PO TABS
40.0000 mg | ORAL_TABLET | Freq: Every day | ORAL | Status: DC
  Administered 2016-03-12 – 2016-03-13 (×2): 40 mg via ORAL
  Filled 2016-03-12 (×2): qty 2

## 2016-03-12 MED ORDER — MENTHOL 3 MG MT LOZG: 1.0000 | LOZENGE | OROMUCOSAL | Status: DC | PRN

## 2016-03-12 MED ORDER — DEXAMETHASONE SODIUM PHOSPHATE 10 MG/ML IJ SOLN
10.0000 mg | Freq: Once | INTRAMUSCULAR | Status: AC
  Administered 2016-03-13: 10 mg via INTRAVENOUS
  Filled 2016-03-12: qty 1

## 2016-03-12 MED ORDER — PANTOPRAZOLE SODIUM 40 MG PO TBEC
40.0000 mg | DELAYED_RELEASE_TABLET | Freq: Two times a day (BID) | ORAL | Status: DC
  Administered 2016-03-12 – 2016-03-14 (×4): 40 mg via ORAL
  Filled 2016-03-12 (×5): qty 1

## 2016-03-12 MED ORDER — PROMETHAZINE HCL 25 MG/ML IJ SOLN: 6.2500 mg | INTRAMUSCULAR | Status: DC | PRN

## 2016-03-12 MED ORDER — FLUTICASONE PROPIONATE 50 MCG/ACT NA SUSP
1.0000 | Freq: Two times a day (BID) | NASAL | Status: DC
  Filled 2016-03-12: qty 16

## 2016-03-12 MED ORDER — FLEET ENEMA 7-19 GM/118ML RE ENEM: 1.0000 | ENEMA | Freq: Once | RECTAL | Status: DC | PRN

## 2016-03-12 MED ORDER — HYDROMORPHONE HCL 1 MG/ML IJ SOLN
INTRAMUSCULAR | Status: AC
  Administered 2016-03-12: 0.5 mg via INTRAVENOUS
  Filled 2016-03-12: qty 1

## 2016-03-12 MED ORDER — ONDANSETRON HCL 4 MG/2ML IJ SOLN
INTRAMUSCULAR | Status: AC
  Filled 2016-03-12: qty 2

## 2016-03-12 MED ORDER — VANCOMYCIN HCL IN DEXTROSE 1-5 GM/200ML-% IV SOLN
1000.0000 mg | INTRAVENOUS | Status: AC
  Administered 2016-03-12: 1000 mg via INTRAVENOUS
  Filled 2016-03-12: qty 200

## 2016-03-12 MED ORDER — MIDAZOLAM HCL 2 MG/2ML IJ SOLN
INTRAMUSCULAR | Status: AC
  Filled 2016-03-12: qty 2

## 2016-03-12 MED ORDER — BUPIVACAINE HCL (PF) 0.25 % IJ SOLN
INTRAMUSCULAR | Status: DC | PRN
  Administered 2016-03-12: 30 mL

## 2016-03-12 MED ORDER — PROPOFOL 10 MG/ML IV BOLUS
INTRAVENOUS | Status: DC | PRN
  Administered 2016-03-12: 30 mg via INTRAVENOUS

## 2016-03-12 MED ORDER — MIDAZOLAM HCL 5 MG/5ML IJ SOLN
INTRAMUSCULAR | Status: DC | PRN
  Administered 2016-03-12: 2 mg via INTRAVENOUS

## 2016-03-12 MED ORDER — PHENYLEPHRINE HCL 10 MG/ML IJ SOLN
INTRAVENOUS | Status: DC | PRN
  Administered 2016-03-12: 35 ug/min via INTRAVENOUS

## 2016-03-12 MED ORDER — TRANEXAMIC ACID 1000 MG/10ML IV SOLN
2000.0000 mg | Freq: Once | INTRAVENOUS | Status: DC
  Filled 2016-03-12: qty 20

## 2016-03-12 MED ORDER — ONDANSETRON HCL 4 MG PO TABS
4.0000 mg | ORAL_TABLET | Freq: Four times a day (QID) | ORAL | Status: DC | PRN
  Filled 2016-03-12: qty 1

## 2016-03-12 MED ORDER — DEXAMETHASONE SODIUM PHOSPHATE 10 MG/ML IJ SOLN
10.0000 mg | Freq: Once | INTRAMUSCULAR | Status: AC
  Administered 2016-03-12: 10 mg via INTRAVENOUS

## 2016-03-12 MED ORDER — METHOCARBAMOL 1000 MG/10ML IJ SOLN
500.0000 mg | Freq: Four times a day (QID) | INTRAVENOUS | Status: DC | PRN
  Administered 2016-03-12: 500 mg via INTRAVENOUS
  Filled 2016-03-12: qty 550
  Filled 2016-03-12: qty 5

## 2016-03-12 MED ORDER — ACETAMINOPHEN 325 MG PO TABS: 650.0000 mg | ORAL_TABLET | Freq: Four times a day (QID) | ORAL | Status: DC | PRN

## 2016-03-12 MED ORDER — PROPOFOL 10 MG/ML IV BOLUS
INTRAVENOUS | Status: AC
  Filled 2016-03-12: qty 60

## 2016-03-12 MED ORDER — RIVAROXABAN 10 MG PO TABS
10.0000 mg | ORAL_TABLET | Freq: Every day | ORAL | Status: DC
  Administered 2016-03-13 – 2016-03-14 (×2): 10 mg via ORAL
  Filled 2016-03-12 (×3): qty 1

## 2016-03-12 MED ORDER — METOCLOPRAMIDE HCL 5 MG/ML IJ SOLN: 5.0000 mg | Freq: Three times a day (TID) | INTRAMUSCULAR | Status: DC | PRN

## 2016-03-12 MED ORDER — BUPIVACAINE HCL (PF) 0.25 % IJ SOLN
INTRAMUSCULAR | Status: AC
  Filled 2016-03-12: qty 30

## 2016-03-12 MED ORDER — METHOCARBAMOL 500 MG PO TABS: 500.0000 mg | ORAL_TABLET | Freq: Four times a day (QID) | ORAL | Status: DC | PRN

## 2016-03-12 MED ORDER — ACETAMINOPHEN 500 MG PO TABS
1000.0000 mg | ORAL_TABLET | Freq: Four times a day (QID) | ORAL | Status: AC
  Administered 2016-03-12 – 2016-03-13 (×4): 1000 mg via ORAL
  Filled 2016-03-12 (×4): qty 2

## 2016-03-12 MED ORDER — PHENYLEPHRINE HCL 10 MG/ML IJ SOLN
INTRAMUSCULAR | Status: AC
  Filled 2016-03-12: qty 1

## 2016-03-12 MED ORDER — LEVOTHYROXINE SODIUM 88 MCG PO TABS
88.0000 ug | ORAL_TABLET | Freq: Every day | ORAL | Status: DC
  Administered 2016-03-13 – 2016-03-14 (×2): 88 ug via ORAL
  Filled 2016-03-12 (×2): qty 1

## 2016-03-12 MED ORDER — ACETAMINOPHEN 10 MG/ML IV SOLN
INTRAVENOUS | Status: AC
  Filled 2016-03-12: qty 100

## 2016-03-12 SURGICAL SUPPLY — 35 items
BAG DECANTER FOR FLEXI CONT (MISCELLANEOUS) ×2 IMPLANT
BAG ZIPLOCK 12X15 (MISCELLANEOUS) IMPLANT
BLADE SAG 18X100X1.27 (BLADE) ×2 IMPLANT
CAPT HIP TOTAL 2 ×2 IMPLANT
CLOTH BEACON ORANGE TIMEOUT ST (SAFETY) ×2 IMPLANT
COVER PERINEAL POST (MISCELLANEOUS) ×2 IMPLANT
DECANTER SPIKE VIAL GLASS SM (MISCELLANEOUS) ×2 IMPLANT
DRAPE STERI IOBAN 125X83 (DRAPES) ×2 IMPLANT
DRAPE U-SHAPE 47X51 STRL (DRAPES) ×4 IMPLANT
DRSG ADAPTIC 3X8 NADH LF (GAUZE/BANDAGES/DRESSINGS) ×2 IMPLANT
DRSG MEPILEX BORDER 4X4 (GAUZE/BANDAGES/DRESSINGS) ×2 IMPLANT
DRSG MEPILEX BORDER 4X8 (GAUZE/BANDAGES/DRESSINGS) ×2 IMPLANT
DURAPREP 26ML APPLICATOR (WOUND CARE) ×2 IMPLANT
ELECT REM PT RETURN 9FT ADLT (ELECTROSURGICAL) ×2
ELECTRODE REM PT RTRN 9FT ADLT (ELECTROSURGICAL) ×1 IMPLANT
EVACUATOR 1/8 PVC DRAIN (DRAIN) ×2 IMPLANT
GLOVE BIO SURGEON STRL SZ7.5 (GLOVE) ×2 IMPLANT
GLOVE BIO SURGEON STRL SZ8 (GLOVE) ×4 IMPLANT
GLOVE BIOGEL PI IND STRL 8 (GLOVE) ×2 IMPLANT
GLOVE BIOGEL PI INDICATOR 8 (GLOVE) ×2
GOWN STRL REUS W/TWL LRG LVL3 (GOWN DISPOSABLE) ×2 IMPLANT
GOWN STRL REUS W/TWL XL LVL3 (GOWN DISPOSABLE) ×2 IMPLANT
NS IRRIG 1000ML POUR BTL (IV SOLUTION) ×2 IMPLANT
PACK ANTERIOR HIP CUSTOM (KITS) ×2 IMPLANT
STRIP CLOSURE SKIN 1/2X4 (GAUZE/BANDAGES/DRESSINGS) ×2 IMPLANT
SUT ETHIBOND NAB CT1 #1 30IN (SUTURE) ×2 IMPLANT
SUT MNCRL AB 4-0 PS2 18 (SUTURE) ×2 IMPLANT
SUT VIC AB 2-0 CT1 27 (SUTURE) ×3
SUT VIC AB 2-0 CT1 TAPERPNT 27 (SUTURE) ×3 IMPLANT
SUT VLOC 180 0 24IN GS25 (SUTURE) ×2 IMPLANT
SYR 50ML LL SCALE MARK (SYRINGE) IMPLANT
TRAY FOLEY CATH 14FRSI W/METER (CATHETERS) ×2 IMPLANT
TRAY FOLEY W/METER SILVER 16FR (SET/KITS/TRAYS/PACK) IMPLANT
WATER STERILE IRR 1000ML POUR (IV SOLUTION) ×4 IMPLANT
YANKAUER SUCT BULB TIP 10FT TU (MISCELLANEOUS) ×2 IMPLANT

## 2016-03-12 NOTE — Anesthesia Procedure Notes (Signed)
Spinal  Patient location during procedure: OR End time: 03/12/2016 10:42 AM Staffing Resident/CRNA: Enrigue Catena E Performed: resident/CRNA  Preanesthetic Checklist Completed: patient identified, site marked, surgical consent, pre-op evaluation, timeout performed, IV checked, risks and benefits discussed and monitors and equipment checked Spinal Block Patient position: sitting Prep: Betadine and DuraPrep Patient monitoring: heart rate, continuous pulse ox and blood pressure Location: L3-4 Injection technique: single-shot Needle Needle type: Sprotte  Needle gauge: 24 G Needle length: 9 cm Additional Notes Expiration date of kit checked and confirmed. Patient tolerated procedure well, without complications.

## 2016-03-12 NOTE — Op Note (Signed)
OPERATIVE REPORT- TOTAL HIP ARTHROPLASTY   PREOPERATIVE DIAGNOSIS: Osteoarthritis of the Left hip.   POSTOPERATIVE DIAGNOSIS: Osteoarthritis of the Left  hip.   PROCEDURE: Left total hip arthroplasty, anterior approach.   SURGEON: Gaynelle Arabian, MD   ASSISTANT: Arlee Muslim, PA-C  ANESTHESIA:  Spinal  ESTIMATED BLOOD LOSS:-250 ml    DRAINS: Hemovac x1.   COMPLICATIONS: None   CONDITION: PACU - hemodynamically stable.   BRIEF CLINICAL NOTE: Jade Mooney is a 70 y.o. female who has advanced end-  stage arthritis of their Right  hip with progressively worsening pain and  dysfunction.The patient has failed nonoperative management and presents for  total hip arthroplasty.   PROCEDURE IN DETAIL: After successful administration of spinal  anesthetic, the traction boots for the [facility] bed were placed on both  feet and the patient was placed onto the [facility] bed, boots placed into the leg  holders. The Right hip was then isolated from the perineum with plastic  drapes and prepped and draped in the usual sterile fashion. ASIS and  greater trochanter were marked and a oblique incision was made, starting  at about 1 cm lateral and 2 cm distal to the ASIS and coursing towards  the anterior cortex of the femur. The skin was cut with a 10 blade  through subcutaneous tissue to the level of the fascia overlying the  tensor fascia lata muscle. The fascia was then incised in line with the  incision at the junction of the anterior third and posterior 2/3rd. The  muscle was teased off the fascia and then the interval between the TFL  and the rectus was developed. The Hohmann retractor was then placed at  the top of the femoral neck over the capsule. The vessels overlying the  capsule were cauterized and the fat on top of the capsule was removed.  A Hohmann retractor was then placed anterior underneath the rectus  femoris to give exposure to the entire anterior capsule. A T-shaped   capsulotomy was performed. The edges were tagged and the femoral head  was identified.       Osteophytes are removed off the superior acetabulum.  The femoral neck was then cut in situ with an oscillating saw. Traction  was then applied to the left lower extremity utilizing the [facility]  traction. The femoral head was then removed. Retractors were placed  around the acetabulum and then circumferential removal of the labrum was  performed. Osteophytes were also removed. Reaming starts at 43 mm to  medialize and  Increased in 2 mm increments to 47 mm. We reamed in  approximately 40 degrees of abduction, 20 degrees anteversion. A 48 mm  pinnacle acetabular shell was then impacted in anatomic position under  fluoroscopic guidance with excellent purchase. We did not need to place  any additional dome screws. A 28 mm neutral + 4 marathon liner was then  placed into the acetabular shell.       The femoral lift was then placed along the lateral aspect of the femur  just distal to the vastus ridge. The leg was  externally rotated and capsule  was stripped off the inferior aspect of the femoral neck down to the  level of the lesser trochanter, this was done with electrocautery. The femur was lifted after this was performed. The  leg was then placed in an extended and adducted position essentially delivering the femur. We also removed the capsule superiorly and the piriformis from the piriformis  fossa to gain excellent exposure of the  proximal femur. Rongeur was used to remove some cancellous bone to get  into the lateral portion of the proximal femur for placement of the  initial starter reamer. The starter broaches was placed  the starter broach  and was shown to go down the center of the canal. Broaching  with the  Corail system was then performed starting at size 8, coursing  Up to size 10. A size 10 had excellent torsional and rotational  and axial stability. The trial standard offset neck was then  placed  with a 28 + 1.5 trial head. The hip was then reduced. We confirmed that  the stem was in the canal both on AP and lateral x-rays. It also has excellent sizing. The hip was reduced with outstanding stability through full extension and full external rotation.. AP pelvis was taken and the leg lengths were measured and found to be equal. Hip was then dislocated again and the femoral head and neck removed. The  femoral broach was removed. Size 10 Corail stem with a standard offset  neck was then impacted into the femur following native anteversion. Has  excellent purchase in the canal. Excellent torsional and rotational and  axial stability. It is confirmed to be in the canal on AP and lateral  fluoroscopic views. The 28 + 1.5 ceramic head was placed and the hip  reduced with outstanding stability. Again AP pelvis was taken and it  confirmed that the leg lengths were equal. The wound was then copiously  irrigated with saline solution and the capsule reattached and repaired  with Ethibond suture. 30 ml of .25% Bupivicaine was  injected into the capsule and into the edge of the tensor fascia lata as well as subcutaneous tissue. The fascia overlying the tensor fascia lata was then closed with a running #1 V-Loc. Subcu was closed with interrupted 2-0 Vicryl and subcuticular running 4-0 Monocryl. Incision was cleaned  and dried. Steri-Strips and a bulky sterile dressing applied. Hemovac  drain was hooked to suction and then the patient was awakened and transported to  recovery in stable condition.        Please note that a surgical assistant was a medical necessity for this procedure to perform it in a safe and expeditious manner. Assistant was necessary to provide appropriate retraction of vital neurovascular structures and to prevent femoral fracture and allow for anatomic placement of the prosthesis.  Gaynelle Arabian, M.D.

## 2016-03-12 NOTE — Evaluation (Signed)
Physical Therapy Evaluation Patient Details Name: Jade Mooney MRN: [id number] DOB: 10-Sep-1945 Today's Date: 03/12/2016   History of Present Illness  Pt s/p L THR and with hx of TIA, L TKR and breast CA with R mastectomy  Clinical Impression  Pt s/p L THR presents with decreased L LE strength/ROM and post op pain limiting functional mobility.  Pt should progress to dc home with family assist and HHPT follow up.    Follow Up Recommendations Home health PT    Equipment Recommendations  None recommended by PT    Recommendations for Other Services OT consult     Precautions / Restrictions Precautions Precautions: Fall Restrictions Weight Bearing Restrictions: No Other Position/Activity Restrictions: WBAT      Mobility  Bed Mobility Overal bed mobility: Needs Assistance Bed Mobility: Supine to Sit;Sit to Supine     Supine to sit: Min assist;Mod assist Sit to supine: Min assist;Mod assist   General bed mobility comments: cues for sequence and use of R LE to self assist  Transfers Overall transfer level: Needs assistance Equipment used: Rolling walker (2 wheeled) Transfers: Sit to/from Stand Sit to Stand: Min assist         General transfer comment: cues for LE management and use of UEs to self assist  Ambulation/Gait Ambulation/Gait assistance: Min assist Ambulation Distance (Feet): 38 Feet Assistive device: Rolling walker (2 wheeled) Gait Pattern/deviations: Step-to pattern;Decreased step length - right;Decreased step length - left;Shuffle;Trunk flexed Gait velocity: decr Gait velocity interpretation: Below normal speed for age/gender General Gait Details: cues for sequence, posture and position from [employer]            Wheelchair Mobility    Modified Rankin (Stroke Patients Only)       Balance                                             Pertinent Vitals/Pain Pain Assessment: 0-10 Pain Score: 4  Pain Location: L  hip Pain Descriptors / Indicators: Aching;Sore Pain Intervention(s): Limited activity within patient's tolerance;Monitored during session;Premedicated before session;Ice applied    Home Living Family/patient expects to be discharged to:: Private residence Living Arrangements: Spouse/significant other Available Help at Discharge: Family Type of Home: House Home Access: Stairs to enter Entrance Stairs-Rails: None Entrance Stairs-Number of Steps: 3 Home Layout: Able to live on main level with bedroom/bathroom Home Equipment: Walker - 2 wheels;Cane - single point      Prior Function Level of Independence: Independent with assistive device(s)         Comments: used cane and RW as needed     Hand Dominance        Extremity/Trunk Assessment   Upper Extremity Assessment Upper Extremity Assessment: Overall WFL for tasks assessed    Lower Extremity Assessment Lower Extremity Assessment: LLE deficits/detail    Cervical / Trunk Assessment Cervical / Trunk Assessment: Normal  Communication   Communication: No difficulties  Cognition Arousal/Alertness: Awake/alert Behavior During Therapy: WFL for tasks assessed/performed Overall Cognitive Status: Within Functional Limits for tasks assessed                      General Comments      Exercises Total Joint Exercises Ankle Circles/Pumps: AROM;Both;15 reps;Supine   Assessment/Plan    PT Assessment Patient needs continued PT services  PT Problem List Decreased strength;Decreased range of motion;Decreased  activity tolerance;Decreased mobility;Decreased knowledge of use of DME;Pain          PT Treatment Interventions DME instruction;Gait training;Stair training;Functional mobility training;Therapeutic activities;Therapeutic exercise;Patient/family education    PT Goals (Current goals can be found in the Care Plan section)  Acute Rehab PT Goals Patient Stated Goal: Regain IND and walk with less pain than before  surgery PT Goal Formulation: With patient Time For Goal Achievement: 03/15/16 Potential to Achieve Goals: Good    Frequency 7X/week   Barriers to discharge        Co-evaluation               End of Session Equipment Utilized During Treatment: Gait belt Activity Tolerance: Patient tolerated treatment well Patient left: in bed;with call bell/phone within reach           Time: [id number] PT Time Calculation (min) (ACUTE ONLY): 29 min   Charges:   PT Evaluation $PT Eval Low Complexity: 1 Procedure PT Treatments $Gait Training: 8-22 mins   PT G Codes:        Jade Mooney 2016-03-18, 5:47 PM

## 2016-03-12 NOTE — Interval H&P Note (Signed)
History and Physical Interval Note:  03/12/2016 10:01 AM  Jade Mooney  has presented today for surgery, with the diagnosis of LEFT HIP OA  The various methods of treatment have been discussed with the patient and family. After consideration of risks, benefits and other options for treatment, the patient has consented to  Procedure(s): LEFT TOTAL HIP ARTHROPLASTY ANTERIOR APPROACH (Left) as a surgical intervention .  The patient's history has been reviewed, patient examined, no change in status, stable for surgery.  I have reviewed the patient's chart and labs.  Questions were answered to the patient's satisfaction.     [name]

## 2016-03-12 NOTE — Anesthesia Postprocedure Evaluation (Signed)
Anesthesia Post Note  Patient: Jade Mooney  Procedure(s) Performed: Procedure(s) (LRB): LEFT TOTAL HIP ARTHROPLASTY ANTERIOR APPROACH (Left)  Patient location during evaluation: PACU Anesthesia Type: Spinal Level of consciousness: awake and alert Pain management: pain level controlled Vital Signs Assessment: post-procedure vital signs reviewed and stable Respiratory status: spontaneous breathing and respiratory function stable Cardiovascular status: blood pressure returned to baseline and stable Postop Assessment: spinal receding Anesthetic complications: no       Last Vitals:  Vitals:   03/12/16 1345 03/12/16 1400  BP:  139/79  Pulse: (!) 57 63  Resp: 10 16  Temp: 36.4 C 36.6 C    Last Pain:  Vitals:   03/12/16 1345  TempSrc:   PainSc: [place]

## 2016-03-12 NOTE — Transfer of Care (Signed)
Immediate Anesthesia Transfer of Care Note  Patient: Jade Mooney  Procedure(s) Performed: Procedure(s): LEFT TOTAL HIP ARTHROPLASTY ANTERIOR APPROACH (Left)  Patient Location: PACU  Anesthesia Type:Spinal  Level of Consciousness: awake, alert , oriented and patient cooperative  Airway & Oxygen Therapy: Patient Spontanous Breathing and Patient connected to face mask oxygen  Post-op Assessment: Report given to RN and Post -op Vital signs reviewed and stable  Post vital signs: stable  Last Vitals:  Vitals:   03/12/16 0756  BP: 136/67  Pulse: (!) 59  Resp: 16  Temp: 36.8 C    Last Pain:  Vitals:   03/12/16 0818  TempSrc:   PainSc: 3       Patients Stated Pain Goal: 4 ([id number])  Complications: No apparent anesthesia complications

## 2016-03-12 NOTE — Anesthesia Preprocedure Evaluation (Addendum)
Anesthesia Evaluation  Patient identified by MRN, date of birth, ID band Patient awake    Reviewed: Allergy & Precautions, NPO status , Patient's Chart, lab work & pertinent test results  Airway Mallampati: II  TM Distance: >3 FB Neck ROM: Full    Dental no notable dental hx.    Pulmonary neg pulmonary ROS,    Pulmonary exam normal breath sounds clear to auscultation       Cardiovascular hypertension, negative cardio ROS Normal cardiovascular exam Rhythm:Regular Rate:Normal     Neuro/Psych Anxiety TIAnegative psych ROS   GI/Hepatic Neg liver ROS, GERD  ,  Endo/Other  Hypothyroidism   Renal/GU negative Renal ROS     Musculoskeletal  (+) Arthritis ,   Abdominal   Peds  Hematology negative hematology ROS (+)   Anesthesia Other Findings   Reproductive/Obstetrics negative OB ROS                             Anesthesia Physical Anesthesia Plan  ASA: II  Anesthesia Plan: Spinal   Post-op Pain Management:    Induction:   Airway Management Planned:   Additional Equipment:   Intra-op Plan:   Post-operative Plan:   Informed Consent: I have reviewed the patients History and Physical, chart, labs and discussed the procedure including the risks, benefits and alternatives for the proposed anesthesia with the patient or authorized representative who has indicated his/her understanding and acceptance.   Dental advisory given  Plan Discussed with: CRNA  Anesthesia Plan Comments: (Last dose of plavix 7 days ago.)       Anesthesia Quick Evaluation

## 2016-03-12 NOTE — H&P (View-Only) (Signed)
Jade Mooney DOB: [birth year] Married / Language: English / Race: White Female Date of Admission:  03/12/2016 CC:  Left hip pain History of Present Illness  The patient is a 70 year old female who comes in  for a preoperative History and Physical. The patient is scheduled for a left total hip arthroplasty (anterior) to be performed by Dr. Dione Mooney. Aluisio, MD at [facility] on 03-12-2016. The patient reports left hip problems including pain symptoms that have been present for 5 year(s). The symptoms began without any known injury. Patient states that she has constant pain on the lateral side of her left hip. She does have some groin pain at times. She said that moving or raising her leg causes the hip to hurt more. She takes Dilaudid to help her get through the day. She had a knee replacement on the left side with Dr.Collins in 2007. She states that her knee hurts as well, but wonders if that is coming from her hip. She has had progressively worsening left hip pain for close to a year now. It has gotten much worse in the past several months. Dr. Theda Sers replaced her left knee in 2007 and this pain initially started in the knee, but is now occurring more in the groin and radiating to the knee. She is not having any swelling or mechanical type symptoms in the knee. Pain in the hip is worse with weightbearing, but is also now occurring at rest. It is limiting what she can and cannot do. She would like to be more active, but cannot do so because of this pain in the left leg. AP pelvis, AP and lateral of the left hip show advanced end-stage arthritic change of that hip. She has bone on bone with subchondral cyst. Right hip is unremarkable. She has advanced end-stage arthritis. She has had progressive pain and dysfunction. At this point, the most predictable means of improving her pain and function is total hip arthroplasty. The procedure, risks, potential complications and rehab course are  discussed in detail and the patient elects to proceed. The goals of this procedure are decreased pain and increased function. There is a high liklihood that both of these goals will be achieved. They have been treated conservatively in the past for the above stated problem and despite conservative measures, they continue to have progressive pain and severe functional limitations and dysfunction. They have failed non-operative management including home exercise, medications. It is felt that they would benefit from undergoing total joint replacement. Risks and benefits of the procedure have been discussed with the patient and they elect to proceed with surgery. There are no active contraindications to surgery such as ongoing infection or rapidly progressive neurological disease.  Problem List/Past Medical History of total left knee replacement [id number])  Primary osteoarthritis of left hip (M16.12)  Breast Cancer  Cerebrovascular Accident  TIA Diverticulitis Of Colon  Gastroesophageal Reflux Disease  Hypercholesterolemia  Hyperthyroidism  Macular Degeneration  Allergies Percocet *ANALGESICS - OPIOID*  Percodan *ANALGESICS - OPIOID*  Amoxicillin *PENICILLINS*  whelps  Family History  Cerebrovascular Accident  mother Diabetes Mellitus  sister Heart Disease  father Hypertension  father  Social History Alcohol use  current drinker; drinks wine; less than 5 per week Children  0 Current work status  retired Engineer, agricultural (Currently)  no Drug/Alcohol Rehab (Previously)  no Exercise  Exercises weekly; does gym / weights Illicit drug use  no Living situation  live with spouse Marital status  married Number of  flights of stairs before winded  4-5 Pain Contract  no Tobacco / smoke exposure  no Tobacco use  former smoker; smoke(d) 1 pack(s) per day Advance Directives  Living Will, Healthcare POA  Medication History Voltaren (1% Gel, 4 gram Transdermal  qid to affected area, Taken starting 03/09/2015) Active. Pennsaid (2% Solution, 2 (two) Pump Transdermal apply to knee bid, Taken starting 02/23/2015) Active. Flonase (50MCG/ACT Suspension, Nasal) Active. Anastrozole (1MG  Tablet, Oral) Active. HYDROmorphone HCl (4MG  Tablet, Oral) Active. TraZODone HCl (50MG  Tablet, Oral) Active. Calcium Citrate (500MG  Capsule, Oral) Active. Multi Vitamin Active. Vitamin E Active. Vitamin C Active. Lutein Active. Glucosamine Chondroitin Active. Beta Carotene Active. Tylenol Active. Systane ultra Active. Miralax Active. Vitamin D3 (Oral) Specific strength unknown - Active. Naproxen (Oral) Specific strength unknown - Active. Allergy Eye Drops Active. Zoledronic Acid (4MG  For Solution, Intravenous) Active. Claritin Active. Synthroid (88MCG Tablet, Oral) Active. Simvastatin (40MG  Tablet, Oral) Active. MetroNIDAZOLE (0.75% Cream, External) Active. Aspirin (325MG  Tablet, Oral) Active. Pantoprazole Sodium (40MG  Tablet DR, Oral) Active.  Past Surgical History Arthroscopy of Knee  right Breast Biopsy  left Cataract Surgery  bilateral Colon Polyp Removal - Colonoscopy  Mastectomy - Bilateral  right Tonsillectomy  Total Knee Replacement  left Tubal Ligation      Review of Systems  General Present- Night Sweats (secondary to medications). Not Present- Chills, Fatigue, Fever, Memory Loss, Weight Gain and Weight Loss. Skin Not Present- Eczema, Hives, Itching, Lesions and Rash. HEENT Not Present- Dentures, Double Vision, Headache, Hearing Loss, Tinnitus and Visual Loss. Respiratory Not Present- Allergies, Chronic Cough, Coughing up blood, Shortness of breath at rest and Shortness of breath with exertion. Cardiovascular Not Present- Chest Pain, Difficulty Breathing Lying Down, Murmur, Palpitations, Racing/skipping heartbeats and Swelling. Gastrointestinal Not Present- Abdominal Pain, Bloody Stool, Constipation, Diarrhea,  Difficulty Swallowing, Heartburn, Jaundice, Loss of appetitie, Nausea and Vomiting. Female Genitourinary Not Present- Blood in Urine, Discharge, Flank Pain, Incontinence, Painful Urination, Urgency, Urinary frequency, Urinary Retention, Urinating at Night and Weak urinary stream. Musculoskeletal Present- Back Pain, Joint Pain and Morning Stiffness. Not Present- Joint Swelling, Muscle Pain, Muscle Weakness and Spasms. Neurological Not Present- Blackout spells, Difficulty with balance, Dizziness, Paralysis, Tremor and Weakness. Psychiatric Not Present- Insomnia.  Vitals Weight: 137 lb Height: 63.5in Body Surface Area: 1.66 m Body Mass Index: 23.89 kg/m  Pulse: 64 (Regular)  BP: 128/78 (Sitting, Right Arm, Standard)   Physical Exam  General Mental Status -Alert, cooperative and good historian. General Appearance-pleasant, Not in acute distress. Orientation-Oriented X3. Build & Nutrition-Well nourished and Well developed.  Head and Neck Head-normocephalic, atraumatic . Neck Global Assessment - supple, no bruit auscultated on the right, no bruit auscultated on the left.  Eye Vision-Wears corrective lenses. Pupil - Bilateral-Regular and Round. Motion - Bilateral-EOMI.  Chest and Lung Exam Auscultation Breath sounds - clear at anterior chest wall and clear at posterior chest wall. Adventitious sounds - No Adventitious sounds.  Cardiovascular Auscultation Rhythm - Regular rate and rhythm. Heart Sounds - S1 WNL and S2 WNL. Murmurs & Other Heart Sounds - Auscultation of the heart reveals - No Murmurs.  Abdomen Palpation/Percussion Tenderness - Abdomen is non-tender to palpation. Rigidity (guarding) - Abdomen is soft. Auscultation Auscultation of the abdomen reveals - Bowel sounds normal.  Female Genitourinary Note: Not done, not pertinent to present illness   Musculoskeletal Note: Well-developed female, in no distress. Right hip has normal motion,  no discomfort. Left hip can be flexed to about 95, minimal internal rotation, 10 to 20 of external rotation, 20 abduction.  Her left knee shows no effusion. Range of motion of the left knee is about 0 to 115. She does not have any medial or lateral joint line tenderness. There is no instability.  RADIOGRAPHS AP pelvis, AP and lateral of the left hip show advanced end-stage arthritic change of that hip. She has bone on bone with subchondral cyst. Right hip is unremarkable.  Assessment & Plan History of total left knee replacement [id number]) Primary osteoarthritis of left hip (M16.12)  Note:Surgical Plans: Left Total Hip Replacement - Anterior Approach  Disposition: Home  PCP: Dr. Ann Held - Patient has been seen preoperatively and felt to be stable for surgery. Oncology: Dr. Gunnar Bulla Magrinat  Topical TXA - TIA, Breast Cancer  Anesthesia Issues: None  AVOID THE RIGHT ARM FOR BPS AND IVS  Signed electronically by Ok Edwards, III PA-C

## 2016-03-13 LAB — BASIC METABOLIC PANEL
Anion gap: 7 (ref 5–15)
BUN: 10 mg/dL (ref 6–20)
CO2: 27 mmol/L (ref 22–32)
Calcium: 8.4 mg/dL — ABNORMAL LOW (ref 8.9–10.3)
Chloride: 106 mmol/L (ref 101–111)
Creatinine, Ser: 0.59 mg/dL (ref 0.44–1.00)
GFR calc Af Amer: >60 mL/min (ref >60)
GFR calc non Af Amer: >60 mL/min (ref >60)
Glucose, Bld: 121 mg/dL — ABNORMAL HIGH (ref 65–99)
Potassium: 3.7 mmol/L (ref 3.5–5.1)
Sodium: 140 mmol/L (ref 135–145)

## 2016-03-13 LAB — CBC
HCT: 33 % — ABNORMAL LOW (ref 36.0–46.0)
Hemoglobin: 10.9 g/dL — ABNORMAL LOW (ref 12.0–15.0)
MCH: 27.3 pg (ref 26.0–34.0)
MCHC: 33 g/dL (ref 30.0–36.0)
MCV: 82.5 fL (ref 78.0–100.0)
Platelets: 196 10*3/uL (ref 150–400)
RBC: 4 MIL/uL (ref 3.87–5.11)
RDW: 14.9 % (ref 11.5–15.5)
WBC: 9.7 10*3/uL (ref 4.0–10.5)

## 2016-03-13 MED ORDER — HYDROMORPHONE HCL 2 MG PO TABS: 2.0000 mg | ORAL_TABLET | ORAL | 0 refills | Status: DC | PRN

## 2016-03-13 MED ORDER — RIVAROXABAN 10 MG PO TABS: 10.0000 mg | ORAL_TABLET | Freq: Every day | ORAL | 0 refills | Status: DC

## 2016-03-13 MED ORDER — METHOCARBAMOL 500 MG PO TABS: 500.0000 mg | ORAL_TABLET | Freq: Four times a day (QID) | ORAL | 0 refills | Status: DC | PRN

## 2016-03-13 NOTE — Care Management Note (Signed)
Case Management Note  Patient Details  Name: Jade Mooney MRN: [id number] Date of Birth: 08-26-1945  Subjective/Objective:                  LEFT TOTAL HIP ARTHROPLASTY ANTERIOR APPROACH (Left) Action/Plan: Discharge planning Expected Discharge Date:  03/14/16               Expected Discharge Plan:  Home/Self Care  In-House Referral:     Discharge planning Services  CM Consult  Post Acute Care Choice:  NA Choice offered to:  Patient  DME Arranged:  [facility] rolling DME Agency:  [place]:  NA [place] Agency:  NA  Status of Service:  Completed, signed off  If discussed at [place] of Stay Meetings, dates discussed:    Additional Comments: CM met with pt in room to confirm plan is for outpt PT; pt confirms.  CM notified [place] DME rep, [place] to please deliver the rolling walker to room prior to discharge.  No other CM needs were communicated. Dellie Catholic, RN 03/13/2016, 11:53 AM

## 2016-03-13 NOTE — Progress Notes (Signed)
Physical Therapy Treatment Patient Details Name: Jade Mooney MRN: [id number] DOB: March 29, 1945 Today's Date: 03/13/2016    History of Present Illness Pt s/p L THR and with hx of TIA, L TKR and breast CA with R mastectomy    PT Comments    Pt progressing well with mobility and hopeful for return home tomorrow.  Follow Up Recommendations  Home health PT     Equipment Recommendations  None recommended by PT    Recommendations for Other Services OT consult     Precautions / Restrictions Precautions Precautions: Fall Restrictions Weight Bearing Restrictions: No Other Position/Activity Restrictions: WBAT    Mobility  Bed Mobility Overal bed mobility: Needs Assistance Bed Mobility: Sit to Supine     Supine to sit: Min assist Sit to supine: Min guard   General bed mobility comments: Cues for sequence and use of R LE to self assist  Transfers Overall transfer level: Needs assistance Equipment used: Rolling walker (2 wheeled) Transfers: Sit to/from Stand Sit to Stand: Min guard;Supervision         General transfer comment: cues for UE placement  Ambulation/Gait Ambulation/Gait assistance: Min guard Ambulation Distance (Feet): 222 Feet Assistive device: Rolling walker (2 wheeled) Gait Pattern/deviations: Decreased step length - right;Decreased step length - left;Shuffle;Trunk flexed;Step-to pattern;Step-through pattern Gait velocity: decr Gait velocity interpretation: Below normal speed for age/gender General Gait Details: cues for posture, position from RW and initial sequence   Stairs Stairs: Yes   Stair Management: No rails;One rail Left;Step to pattern;Forwards;With walker;Backwards;With cane Number of Stairs: 4 General stair comments: 2 stairs bkwd with RW and 2 steps fwd with cane and rail  Wheelchair Mobility    Modified Rankin (Stroke Patients Only)       Balance                                    Cognition  Arousal/Alertness: Awake/alert Behavior During Therapy: WFL for tasks assessed/performed Overall Cognitive Status: Within Functional Limits for tasks assessed                      Exercises      General Comments        Pertinent Vitals/Pain Pain Assessment: 0-10 Pain Score: 4  Pain Location: Lhip Pain Descriptors / Indicators: Sore Pain Intervention(s): Limited activity within patient's tolerance;Monitored during session;Premedicated before session;Ice applied    Home Living Family/patient expects to be discharged to:: Private residence Living Arrangements: Spouse/significant other Available Help at Discharge: Family         Home Equipment: Bedside commode;Grab bars - toilet;Grab bars - tub/shower      Prior Function Level of Independence: Independent with assistive device(s)      Comments: has AE for adls   PT Goals (current goals can now be found in the care plan section) Acute Rehab PT Goals Patient Stated Goal: Regain IND and walk with less pain than before surgery PT Goal Formulation: With patient Time For Goal Achievement: 03/15/16 Potential to Achieve Goals: Good Progress towards PT goals: Progressing toward goals    Frequency    7X/week      PT Plan Current plan remains appropriate    Co-evaluation             End of Session Equipment Utilized During Treatment: Gait belt Activity Tolerance: Patient tolerated treatment well Patient left: in bed;with call bell/phone within reach;with family/visitor present  Time: [id number] PT Time Calculation (min) (ACUTE ONLY): 30 min  Charges:  $Gait Training: 8-22 mins $Therapeutic Activity: 8-22 mins                    G Codes:      Danecia Underdown 04-11-16, 2:36 PM

## 2016-03-13 NOTE — Progress Notes (Signed)
   Subjective: 1 Day Post-Op Procedure(s) (LRB): LEFT TOTAL HIP ARTHROPLASTY ANTERIOR APPROACH (Left) Patient reports pain as mild.   Patient seen in rounds for Dr. Wynelle Link. Patient is well, but has had some minor complaints of pain in the hip, requiring pain medications We will resume therapy today.  She walked nearly 40 feet day of surgery.  If they do well with therapy and meets all goals, then will allow home later this afternoon following therapy. Plan is to go Home after hospital stay.  Objective: Vital signs in last 24 hours: Temp:  [97.4 F (36.3 C)-98.1 F (36.7 C)] 97.4 F (36.3 C) (12/21 0938) Pulse Rate:  [51-68] 54 (12/21 0938) Resp:  [10-22] 16 (12/21 0938) BP: (99-139)/(59-105) 113/62 (12/21 0938) SpO2:  [97 %-100 %] 100 % (12/21 0938)  Intake/Output from previous day:  Intake/Output Summary (Last 24 hours) at 03/13/16 0942 Last data filed at 03/13/16 0756  Gross per 24 hour  Intake          3558.75 ml  Output             4835 ml  Net         -1276.25 ml    Intake/Output this shift: Total I/O In: 240 [P.O.:240] Out: -   Labs:  Recent Labs  03/13/16 0426  HGB 10.9*    Recent Labs  03/13/16 0426  WBC 9.7  RBC 4.00  HCT 33.0*  PLT 196    Recent Labs  03/13/16 0426  NA 140  K 3.7  CL 106  CO2 27  BUN 10  CREATININE 0.59  GLUCOSE 121*  CALCIUM 8.4*   No results for input(s): LABPT, INR in the last 72 hours.  EXAM General - Patient is Alert, Appropriate and Oriented Extremity - Neurovascular intact Sensation intact distally Intact pulses distally Dorsiflexion/Plantar flexion intact Dressing - dressing C/D/I Motor Function - intact, moving foot and toes well on exam.  Hemovac pulled without difficulty.  Past Medical History:  Diagnosis Date  . Anxiety   . Cancer [facility])    Breast 1997 right tx with mastectomy and chemo, metastatic now  . GERD (gastroesophageal reflux disease)   . Hypercholesterolemia   . Hypertension   .  Hypothyroidism   . Osteoarthritis    oa  . TIA (transient ischemic attack) last 11-08-15   x 2 total    Assessment/Plan: 1 Day Post-Op Procedure(s) (LRB): LEFT TOTAL HIP ARTHROPLASTY ANTERIOR APPROACH (Left) Principal Problem:   OA (osteoarthritis) of hip  Estimated body mass index is 23.85 kg/m as calculated from the following:   Height as of this encounter: 5' 3.5" (1.613 m).   Weight as of this encounter: 62.1 kg (136 lb 12.8 oz). Up with therapy Discharge home with home health  DVT Prophylaxis - Xarelto Weight Bearing As Tolerated left Leg Hemovac Pulled Begin Therapy  If meets goals and able to go home: Discharge home with home health Diet - Cardiac diet Follow up - in 2 weeks Activity - WBAT Disposition - Home Condition Upon Discharge - pending D/C Meds - See DC Summary DVT Prophylaxis - Xarelto  Arlee Muslim, PA-C Orthopaedic Surgery 03/13/2016, 9:42 AM

## 2016-03-13 NOTE — Progress Notes (Signed)
Physical Therapy Treatment Patient Details Name: SHAWNTELLE KADOW MRN: [id number] DOB: 02-10-1946 Today's Date: 03/13/2016    History of Present Illness Pt s/p L THR and with hx of TIA, L TKR and breast CA with R mastectomy    PT Comments    Pt motivated and progressing well with mobility but c/o mild lightheadedness with mobility.  Follow Up Recommendations  Home health PT     Equipment Recommendations  None recommended by PT    Recommendations for Other Services OT consult     Precautions / Restrictions Precautions Precautions: Fall Restrictions Weight Bearing Restrictions: No Other Position/Activity Restrictions: WBAT    Mobility  Bed Mobility Overal bed mobility: Needs Assistance Bed Mobility: Supine to Sit     Supine to sit: Min assist     General bed mobility comments: cues for sequence and use of R LE to self assist  Transfers Overall transfer level: Needs assistance Equipment used: Rolling walker (2 wheeled) Transfers: Sit to/from Stand Sit to Stand: Min assist         General transfer comment: cues for LE management and use of UEs to self assist  Ambulation/Gait Ambulation/Gait assistance: Min assist;Min guard Ambulation Distance (Feet): 113 Feet Assistive device: Rolling walker (2 wheeled) Gait Pattern/deviations: Decreased step length - right;Decreased step length - left;Shuffle;Trunk flexed;Step-to pattern;Step-through pattern Gait velocity: decr Gait velocity interpretation: Below normal speed for age/gender General Gait Details: cues for posture, position from RW and initial sequence   Stairs            Wheelchair Mobility    Modified Rankin (Stroke Patients Only)       Balance                                    Cognition Arousal/Alertness: Awake/alert Behavior During Therapy: WFL for tasks assessed/performed Overall Cognitive Status: Within Functional Limits for tasks assessed                       Exercises Total Joint Exercises Ankle Circles/Pumps: AROM;Both;15 reps;Supine Quad Sets: AROM;Both;10 reps;Supine Heel Slides: AAROM;Left;20 reps;Supine Hip ABduction/ADduction: AAROM;Left;15 reps;Supine    General Comments        Pertinent Vitals/Pain Pain Assessment: 0-10 Pain Score: 3  Pain Location: L hip Pain Descriptors / Indicators: Aching;Sore Pain Intervention(s): Limited activity within patient's tolerance;Monitored during session;Premedicated before session    Home Living                      Prior Function            PT Goals (current goals can now be found in the care plan section) Acute Rehab PT Goals Patient Stated Goal: Regain IND and walk with less pain than before surgery PT Goal Formulation: With patient Time For Goal Achievement: 03/15/16 Potential to Achieve Goals: Good Progress towards PT goals: Progressing toward goals    Frequency    7X/week      PT Plan Current plan remains appropriate    Co-evaluation             End of Session Equipment Utilized During Treatment: Gait belt Activity Tolerance: Patient tolerated treatment well Patient left: in chair;with call bell/phone within reach     Time: 0809-0842 PT Time Calculation (min) (ACUTE ONLY): 33 min  Charges:  $Gait Training: 8-22 mins $Therapeutic Exercise: 8-22 mins  G Codes:      Charissa Knowles 2016/03/29, 8:49 AM

## 2016-03-13 NOTE — Discharge Instructions (Addendum)
° °Dr. Frank Aluisio °Total Joint Specialist °[facility] °[address] °[place], [place] [address] °[phone] ° °ANTERIOR APPROACH TOTAL HIP REPLACEMENT POSTOPERATIVE DIRECTIONS ° ° °Hip Rehabilitation, Guidelines Following Surgery  °The results of a hip operation are greatly improved after range of motion and muscle strengthening exercises. Follow all safety measures which are given to protect your hip. If any of these exercises cause increased pain or swelling in your joint, decrease the amount until you are comfortable again. Then slowly increase the exercises. Call your caregiver if you have problems or questions.  ° °HOME CARE INSTRUCTIONS  °Remove items at home which could result in a fall. This includes throw rugs or furniture in walking pathways.  °· ICE to the affected hip every three hours for 30 minutes at a time and then as needed for pain and swelling.  Continue to use ice on the hip for pain and swelling from surgery. You may notice swelling that will progress down to the foot and ankle.  This is normal after surgery.  Elevate the leg when you are not up walking on it.   °· Continue to use the breathing machine which will help keep your temperature down.  It is common for your temperature to cycle up and down following surgery, especially at night when you are not up moving around and exerting yourself.  The breathing machine keeps your lungs expanded and your temperature down. ° ° °DIET °You may resume your previous home diet once your are discharged from the hospital. ° °DRESSING / WOUND CARE / SHOWERING °You may shower 3 days after surgery, but keep the wounds dry during showering.  You may use an occlusive plastic wrap (Press'n Seal for example), NO SOAKING/SUBMERGING IN THE BATHTUB.  If the bandage gets wet, change with a clean dry gauze.  If the incision gets wet, pat the wound dry with a clean towel. °You may start showering once you are discharged home but do not  submerge the incision under water. Just pat the incision dry and apply a dry gauze dressing on daily. °Change the surgical dressing daily and reapply a dry dressing each time. ° °ACTIVITY °Walk with your walker as instructed. °Use walker as long as suggested by your caregivers. °Avoid periods of inactivity such as sitting longer than an hour when not asleep. This helps prevent blood clots.  °You may resume a sexual relationship in one month or when given the OK by your doctor.  °You may return to work once you are cleared by your doctor.  °Do not drive a car for 6 weeks or until released by you surgeon.  °Do not drive while taking narcotics. ° °WEIGHT BEARING °Weight bearing as tolerated with assist device (walker, cane, etc) as directed, use it as long as suggested by your surgeon or therapist, typically at least 4-6 weeks. ° °POSTOPERATIVE CONSTIPATION PROTOCOL °Constipation - defined medically as fewer than three stools per week and severe constipation as less than one stool per week. ° °One of the most common issues patients have following surgery is constipation.  Even if you have a regular bowel pattern at home, your normal regimen is likely to be disrupted due to multiple reasons following surgery.  Combination of anesthesia, postoperative narcotics, change in appetite and fluid intake all can affect your bowels.  In order to avoid complications following surgery, here are some recommendations in order to help you during your recovery period. ° °Colace (docusate) - Pick up an over-the-counter   form of Colace or another stool softener and take twice a day as long as you are requiring postoperative pain medications.  Take with a full glass of water daily.  If you experience loose stools or diarrhea, hold the colace until you stool forms back up.  If your symptoms do not get better within 1 week or if they get worse, check with your doctor. ° °Dulcolax (bisacodyl) - Pick up over-the-counter and take as directed  by the product packaging as needed to assist with the movement of your bowels.  Take with a full glass of water.  Use this product as needed if not relieved by Colace only.  ° °MiraLax (polyethylene glycol) - Pick up over-the-counter to have on hand.  MiraLax is a solution that will increase the amount of water in your bowels to assist with bowel movements.  Take as directed and can mix with a glass of water, juice, soda, coffee, or tea.  Take if you go more than two days without a movement. °Do not use MiraLax more than once per day. Call your doctor if you are still constipated or irregular after using this medication for 7 days in a row. ° °If you continue to have problems with postoperative constipation, please contact the office for further assistance and recommendations.  If you experience "the worst abdominal pain ever" or develop nausea or vomiting, please contact the office immediatly for further recommendations for treatment. ° °ITCHING ° If you experience itching with your medications, try taking only a single pain pill, or even half a pain pill at a time.  You can also use Benadryl over the counter for itching or also to help with sleep.  ° °TED HOSE STOCKINGS °Wear the elastic stockings on both legs for three weeks following surgery during the day but you may remove then at night for sleeping. ° °MEDICATIONS °See your medication summary on the “After Visit Summary” that the nursing staff will review with you prior to discharge.  You may have some home medications which will be placed on hold until you complete the course of blood thinner medication.  It is important for you to complete the blood thinner medication as prescribed by your surgeon.  Continue your approved medications as instructed at time of discharge. ° °PRECAUTIONS °If you experience chest pain or shortness of breath - call 911 immediately for transfer to the hospital emergency department.  °If you develop a fever greater that 101 F,  purulent drainage from wound, increased redness or drainage from wound, foul odor from the wound/dressing, or calf pain - CONTACT YOUR SURGEON.   °                                                °FOLLOW-UP APPOINTMENTS °Make sure you keep all of your appointments after your operation with your surgeon and caregivers. You should call the office at the above phone number and make an appointment for approximately two weeks after the date of your surgery or on the date instructed by your surgeon outlined in the "After Visit Summary". ° °RANGE OF MOTION AND STRENGTHENING EXERCISES  °These exercises are designed to help you keep full movement of your hip joint. Follow your caregiver's or physical therapist's instructions. Perform all exercises about fifteen times, three times per day or as directed. Exercise both hips, even if you   have had only one joint replacement. These exercises can be done on a training (exercise) mat, on the floor, on a table or on a bed. Use whatever works the best and is most comfortable for you. Use music or television while you are exercising so that the exercises are a pleasant break in your day. This will make your life better with the exercises acting as a break in routine you can look forward to.  Lying on your back, slowly slide your foot toward your buttocks, raising your knee up off the floor. Then slowly slide your foot back down until your leg is straight again.  Lying on your back spread your legs as far apart as you can without causing discomfort.  Lying on your side, raise your upper leg and foot straight up from the floor as far as is comfortable. Slowly lower the leg and repeat.  Lying on your back, tighten up the muscle in the front of your thigh (quadriceps muscles). You can do this by keeping your leg straight and trying to raise your heel off the floor. This helps strengthen the largest muscle supporting your knee.  Lying on your back, tighten up the muscles of your  buttocks both with the legs straight and with the knee bent at a comfortable angle while keeping your heel on the floor.   IF YOU ARE TRANSFERRED TO A SKILLED REHAB FACILITY If the patient is transferred to a skilled rehab facility following release from the hospital, a list of the current medications will be sent to the facility for the patient to continue.  When discharged from the skilled rehab facility, please have the facility set up the patient's [place] prior to being released. Also, the skilled facility will be responsible for providing the patient with their medications at time of release from the facility to include their pain medication, the muscle relaxants, and their blood thinner medication. If the patient is still at the rehab facility at time of the two week follow up appointment, the skilled rehab facility will also need to assist the patient in arranging follow up appointment in our office and any transportation needs.  MAKE SURE YOU:  Understand these instructions.  Get help right away if you are not doing well or get worse.    Pick up stool softner and laxative for home use following surgery while on pain medications. Do not submerge incision under water. Please use good hand washing techniques while changing dressing each day. May shower starting three days after surgery. Please use a clean towel to pat the incision dry following showers. Continue to use ice for pain and swelling after surgery. Do not use any lotions or creams on the incision until instructed by your surgeon.  Take Xarelto for two and a half more weeks, then discontinue Xarelto. Once the patient has completed the Xarelto, they may resume the Plavix 75 mg daily at home.   Information on my medicine - XARELTO (Rivaroxaban)  This medication education was reviewed with me or my healthcare representative as part of my discharge preparation.   Why was Xarelto prescribed for  you? Xarelto was prescribed for you to reduce the risk of blood clots forming after orthopedic surgery. The medical term for these abnormal blood clots is venous thromboembolism (VTE).  What do you need to know about xarelto ? Take your Xarelto ONCE DAILY at the same time every day. You may take it either with or without food.  If you  have difficulty swallowing the tablet whole, you may crush it and mix in applesauce just prior to taking your dose.  Take Xarelto exactly as prescribed by your doctor and DO NOT stop taking Xarelto without talking to the doctor who prescribed the medication.  Stopping without other VTE prevention medication to take the place of Xarelto may increase your risk of developing a clot.  After discharge, you should have regular check-up appointments with your healthcare provider that is prescribing your Xarelto.    What do you do if you miss a dose? If you miss a dose, take it as soon as you remember on the same day then continue your regularly scheduled once daily regimen the next day. Do not take two doses of Xarelto on the same day.   Important Safety Information A possible side effect of Xarelto is bleeding. You should call your healthcare provider right away if you experience any of the following: ? Bleeding from an injury or your nose that does not stop. ? Unusual colored urine (red or dark brown) or unusual colored stools (red or black). ? Unusual bruising for unknown reasons. ? A serious fall or if you hit your head (even if there is no bleeding).  Some medicines may interact with Xarelto and might increase your risk of bleeding while on Xarelto. To help avoid this, consult your healthcare provider or pharmacist prior to using any new prescription or non-prescription medications, including herbals, vitamins, non-steroidal anti-inflammatory drugs (NSAIDs) and supplements.  This website has more information on Xarelto: [email or web address]

## 2016-03-13 NOTE — Discharge Summary (Signed)
Physician Discharge Summary   Patient ID: Jade Mooney MRN: [id number] DOB/AGE: 04-13-1945 70 y.o.  Admit date: 03/12/2016 Discharge date: 03-14-2016  Primary Diagnosis:  Osteoarthritis of the Left hip.   Admission Diagnoses:  Past Medical History:  Diagnosis Date  . Anxiety   . 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 GERD (gastroesophageal reflux disease)   . Hypercholesterolemia   . Hypertension   . Hypothyroidism   . Osteoarthritis    oa  . TIA (transient ischemic attack) last 11-08-15   x 2 total   Discharge Diagnoses:   Principal Problem:   OA (osteoarthritis) of hip  Estimated body mass index is 23.85 kg/m as calculated from the following:   Height as of this encounter: 5' 3.5" (1.613 m).   Weight as of this encounter: 62.1 kg (136 lb 12.8 oz).  Procedure(s) (LRB): LEFT TOTAL HIP ARTHROPLASTY ANTERIOR APPROACH (Left)   Consults: None  HPI: Jade Mooney is a 70 y.o. female who has advanced end-  stage arthritis of their Right  hip with progressively worsening pain and  dysfunction.The patient has failed nonoperative management and presents for  total hip arthroplasty.   Laboratory Data: Admission on 03/12/2016  Component Date Value Ref Range Status  . WBC 03/13/2016 9.7  4.0 - 10.5 K/uL Final  . RBC 03/13/2016 4.00  3.87 - 5.11 MIL/uL Final  . Hemoglobin 03/13/2016 10.9* 12.0 - 15.0 g/dL Final  . HCT 03/13/2016 33.0* 36.0 - 46.0 % Final  . MCV 03/13/2016 82.5  78.0 - 100.0 fL Final  . MCH 03/13/2016 27.3  26.0 - 34.0 pg Final  . MCHC 03/13/2016 33.0  30.0 - 36.0 g/dL Final  . RDW 03/13/2016 14.9  11.5 - 15.5 % Final  . Platelets 03/13/2016 196  150 - 400 K/uL Final  . Sodium 03/13/2016 140  135 - 145 mmol/L Final  . Potassium 03/13/2016 3.7  3.5 - 5.1 mmol/L Final  . Chloride 03/13/2016 106  101 - 111 mmol/L Final  . CO2 03/13/2016 27  22 - 32 mmol/L Final  . Glucose, Bld 03/13/2016 121* 65 - 99 mg/dL  Final  . BUN 03/13/2016 10  6 - 20 mg/dL Final  . Creatinine, Ser 03/13/2016 0.59  0.44 - 1.00 mg/dL Final  . Calcium 03/13/2016 8.4* 8.9 - 10.3 mg/dL Final  . GFR calc non Af Amer 03/13/2016 >60  >60 mL/min Final  . GFR calc Af Amer 03/13/2016 >60  >60 mL/min Final   Comment: (NOTE) The eGFR has been calculated using the CKD EPI equation. This calculation has not been validated in all clinical situations. eGFR's persistently <60 mL/min signify possible Chronic Kidney Disease.   Georgiann Hahn gap 03/13/2016 7  5 - 15 Final  Hospital Outpatient Visit on 03/05/2016  Component Date Value Ref Range Status  . aPTT 03/05/2016 31  24 - 36 seconds Final  . WBC 03/05/2016 6.2  4.0 - 10.5 K/uL Final  . RBC 03/05/2016 4.57  3.87 - 5.11 MIL/uL Final  . Hemoglobin 03/05/2016 12.6  12.0 - 15.0 g/dL Final  . HCT 03/05/2016 39.0  36.0 - 46.0 % Final  . MCV 03/05/2016 85.3  78.0 - 100.0 fL Final  . MCH 03/05/2016 27.6  26.0 - 34.0 pg Final  . MCHC 03/05/2016 32.3  30.0 - 36.0 g/dL Final  . RDW 03/05/2016 15.1  11.5 - 15.5 % Final  . Platelets 03/05/2016 220  150 - 400  K/uL Final  . Sodium 03/05/2016 136  135 - 145 mmol/L Final  . Potassium 03/05/2016 4.5  3.5 - 5.1 mmol/L Final  . Chloride 03/05/2016 100* 101 - 111 mmol/L Final  . CO2 03/05/2016 27  22 - 32 mmol/L Final  . Glucose, Bld 03/05/2016 90  65 - 99 mg/dL Final  . BUN 03/05/2016 17  6 - 20 mg/dL Final  . Creatinine, Ser 03/05/2016 0.70  0.44 - 1.00 mg/dL Final  . Calcium 03/05/2016 8.9  8.9 - 10.3 mg/dL Final  . Total Protein 03/05/2016 6.9  6.5 - 8.1 g/dL Final  . Albumin 03/05/2016 4.6  3.5 - 5.0 g/dL Final  . AST 03/05/2016 24  15 - 41 U/L Final  . ALT 03/05/2016 11* 14 - 54 U/L Final  . Alkaline Phosphatase 03/05/2016 63  38 - 126 U/L Final  . Total Bilirubin 03/05/2016 0.5  0.3 - 1.2 mg/dL Final  . GFR calc non Af Amer 03/05/2016 >60  >60 mL/min Final  . GFR calc Af Amer 03/05/2016 >60  >60 mL/min Final   Comment: (NOTE) The eGFR has  been calculated using the CKD EPI equation. This calculation has not been validated in all clinical situations. eGFR's persistently <60 mL/min signify possible Chronic Kidney Disease.   . Anion gap 03/05/2016 9  5 - 15 Final  . Prothrombin Time 03/05/2016 13.1  11.4 - 15.2 seconds Final  . INR 03/05/2016 0.99   Final  . ABO/RH(D) 03/12/2016 O POS   Final  . Antibody Screen 03/12/2016 NEG   Final  . Sample Expiration 03/12/2016 03/15/2016   Final  . Extend sample reason 03/12/2016 NO TRANSFUSIONS OR PREGNANCY IN THE PAST 3 MONTHS   Final  . Color, Urine 03/05/2016 STRAW* YELLOW Final  . APPearance 03/05/2016 CLEAR  CLEAR Final  . Specific Gravity, Urine 03/05/2016 1.005  1.005 - 1.030 Final  . pH 03/05/2016 7.0  5.0 - 8.0 Final  . Glucose, UA 03/05/2016 NEGATIVE  NEGATIVE mg/dL Final  . Hgb urine dipstick 03/05/2016 NEGATIVE  NEGATIVE Final  . Bilirubin Urine 03/05/2016 NEGATIVE  NEGATIVE Final  . Ketones, ur 03/05/2016 NEGATIVE  NEGATIVE mg/dL Final  . Protein, ur 03/05/2016 NEGATIVE  NEGATIVE mg/dL Final  . Nitrite 03/05/2016 NEGATIVE  NEGATIVE Final  . Leukocytes, UA 03/05/2016 NEGATIVE  NEGATIVE Final  . MRSA, PCR 03/05/2016 NEGATIVE  NEGATIVE Final  . Staphylococcus aureus 03/05/2016 NEGATIVE  NEGATIVE Final   Comment:        The Xpert SA Assay (FDA approved for NASAL specimens in patients over 43 years of age), is one component of a comprehensive surveillance program.  Test performance has been validated by [facility] for patients greater than or equal to 68 year old. It is not intended to diagnose infection nor to guide or monitor treatment.      X-Rays:Dg Pelvis Portable  Result Date: 03/12/2016 CLINICAL DATA:  Status post left hip replacement EXAM: PORTABLE PELVIS 1-2 VIEWS COMPARISON:  None. FINDINGS: Left hip prosthesis is noted in satisfactory position. Surgical drain is noted. No acute bony abnormality is noted. Some scattered sclerotic foci are noted  throughout the visualized bony structures consistent with the known history of metastatic disease. IMPRESSION: Status post left hip prosthesis Electronically Signed   By: Inez Catalina M.D.   On: 03/12/2016 14:08   Dg C-arm 1-60 Min-no Report  Result Date: 03/12/2016 There is no Radiologist interpretation  for this exam.  Mm Screening Breast Tomo Uni L  Result Date:  03/05/2016 CLINICAL DATA:  Screening. EXAM: 2D DIGITAL SCREENING UNILATERAL LEFT MAMMOGRAM WITH CAD AND ADJUNCT TOMO COMPARISON:  Previous exam(s). ACR Breast Density Category c: The breast tissue is heterogeneously dense, which may obscure small masses. FINDINGS: There are no findings suspicious for malignancy. Images were processed with CAD. IMPRESSION: No mammographic evidence of malignancy. A result letter of this screening mammogram will be mailed directly to the patient. RECOMMENDATION: Screening mammogram in one year. (Code:SM-B-01Y) BI-RADS CATEGORY  1: Negative. Electronically Signed   By: Everlean Alstrom M.D.   On: 03/05/2016 08:21    EKG:No orders found for this or any previous visit.   Hospital Course: Patient was admitted to [facility] and taken to the OR and underwent the above state procedure without complications.  Patient tolerated the procedure well and was later transferred to the recovery room and then to the orthopaedic floor for postoperative care.  They were given PO and IV analgesics for pain control following their surgery.  They were given 24 hours of postoperative antibiotics of  Anti-infectives    Start     Dose/Rate Route Frequency Ordered Stop   03/12/16 2200  vancomycin (VANCOCIN) IVPB 1000 mg/200 mL premix     1,000 mg 200 mL/hr over 60 Minutes Intravenous  Once 03/12/16 1426 03/12/16 2226   03/12/16 1700  ceFAZolin (ANCEF) IVPB 2g/100 mL premix  Status:  Discontinued     2 g 200 mL/hr over 30 Minutes Intravenous Every 6 hours 03/12/16 1409 03/12/16 1426   03/12/16 0746  vancomycin  (VANCOCIN) IVPB 1000 mg/200 mL premix     1,000 mg 200 mL/hr over 60 Minutes Intravenous On call to O.R. 03/12/16 0746 03/12/16 1054     and started on DVT prophylaxis in the form of Xarelto.   PT and OT were ordered for total hip protocol.  The patient was allowed to be WBAT with therapy. Discharge planning was consulted to help with postop disposition and equipment needs.  Patient had a decent night on the evening of surgery.  They started to get up OOB with therapy on day one.  Hemovac drain was pulled without difficulty.  Continued to work with therapy into day two.  Dressing was changed on day two and the incision was healing well. Patient was seen in rounds on POD 2 with Dr. Wynelle Link and was ready to go home.  Discharge home - straight to outpatient therapy Diet - Cardiac diet Follow up - in 2 weeks Activity - WBAT Disposition - Home Condition Upon Discharge - Good D/C Meds - See DC Summary DVT Prophylaxis - Xarelto  Discharge Instructions    Call MD / Call 911    Complete by:  As directed    If you experience chest pain or shortness of breath, CALL 911 and be transported to the hospital emergency room.  If you develope a fever above 101 F, pus (white drainage) or increased drainage or redness at the wound, or calf pain, call your surgeon's office.   Change dressing    Complete by:  As directed    You may change your dressing dressing daily with sterile 4 x 4 inch gauze dressing and paper tape.  Do not submerge the incision under water.   Constipation Prevention    Complete by:  As directed    Drink plenty of fluids.  Prune juice may be helpful.  You may use a stool softener, such as Colace (over the counter) 100 mg twice a day.  Use  MiraLax (over the counter) for constipation as needed.   Diet - low sodium heart healthy    Complete by:  As directed    Discharge instructions    Complete by:  As directed    Pick up stool softner and laxative for home use following surgery while on  pain medications. Do not submerge incision under water. Please use good hand washing techniques while changing dressing each day. May shower starting three days after surgery. Please use a clean towel to pat the incision dry following showers. Continue to use ice for pain and swelling after surgery. Do not use any lotions or creams on the incision until instructed by your surgeon.   Postoperative Constipation Protocol  Constipation - defined medically as fewer than three stools per week and severe constipation as less than one stool per week.  One of the most common issues patients have following surgery is constipation.  Even if you have a regular bowel pattern at home, your normal regimen is likely to be disrupted due to multiple reasons following surgery.  Combination of anesthesia, postoperative narcotics, change in appetite and fluid intake all can affect your bowels.  In order to avoid complications following surgery, here are some recommendations in order to help you during your recovery period.  Colace (docusate) - Pick up an over-the-counter form of Colace or another stool softener and take twice a day as long as you are requiring postoperative pain medications.  Take with a full glass of water daily.  If you experience loose stools or diarrhea, hold the colace until you stool forms back up.  If your symptoms do not get better within 1 week or if they get worse, check with your doctor.  Dulcolax (bisacodyl) - Pick up over-the-counter and take as directed by the product packaging as needed to assist with the movement of your bowels.  Take with a full glass of water.  Use this product as needed if not relieved by Colace only.   MiraLax (polyethylene glycol) - Pick up over-the-counter to have on hand.  MiraLax is a solution that will increase the amount of water in your bowels to assist with bowel movements.  Take as directed and can mix with a glass of water, juice, soda, coffee, or tea.   Take if you go more than two days without a movement. Do not use MiraLax more than once per day. Call your doctor if you are still constipated or irregular after using this medication for 7 days in a row.  If you continue to have problems with postoperative constipation, please contact the office for further assistance and recommendations.  If you experience "the worst abdominal pain ever" or develop nausea or vomiting, please contact the office immediatly for further recommendations for treatment.   Take Xarelto for two and a half more weeks, then discontinue Xarelto. Once the patient has completed the Xarelto, they may resume the Plavix 75 mg daily at home.    Do not sit on low chairs, stoools or toilet seats, as it may be difficult to get up from low surfaces    Complete by:  As directed    Driving restrictions    Complete by:  As directed    No driving until released by the physician.   Increase activity slowly as tolerated    Complete by:  As directed    Lifting restrictions    Complete by:  As directed    No lifting until released by the physician.  Patient may shower    Complete by:  As directed    You may shower without a dressing once there is no drainage.  Do not wash over the wound.  If drainage remains, do not shower until drainage stops.   TED hose    Complete by:  As directed    Use stockings (TED hose) for 3 weeks on both leg(s).  You may remove them at night for sleeping.   Weight bearing as tolerated    Complete by:  As directed    Laterality:  left   Extremity:  Lower     Allergies as of 03/13/2016      Reactions   Amoxicillin Hives   Has patient had a PCN reaction causing immediate rash, facial/tongue/throat swelling, SOB or lightheadedness with hypotension:unsure Has patient had a PCN reaction causing severe rash involving mucus membranes or skin necrosis:No Has patient had a PCN reaction that required hospitalization:No Has patient had a PCN reaction  occurring within the last 10 years: Yes If all of the above answers are "NO", then may proceed with Cephalosporin use.   Percocet [oxycodone-acetaminophen] Nausea And Vomiting   Percodan [oxycodone-aspirin] Nausea And Vomiting      Medication List    STOP taking these medications   ACIDOPHILUS PO   beta carotene 15 MG capsule   CALCIUM 600+D 600-800 MG-UNIT Tabs Generic drug:  Calcium Carb-Cholecalciferol   Cholecalciferol 1000 units tablet   clopidogrel 75 MG tablet Commonly known as:  PLAVIX   cyanocobalamin 1000 MCG/ML injection Commonly known as:  (VITAMIN B-12)   GLUCOSAMINE-MSM PO   multivitamin with minerals tablet   vitamin C 1000 MG tablet   vitamin E 400 UNIT capsule Generic drug:  vitamin E   zinc gluconate 50 MG tablet   Zoledronic Acid 4 MG/100ML IVPB Commonly known as:  ZOMETA     TAKE these medications   acetaminophen 500 MG tablet Commonly known as:  TYLENOL Take 1,000 mg by mouth every 6 (six) hours as needed for mild pain. Reported on 04/17/2015   anastrozole 1 MG tablet Commonly known as:  ARIMIDEX Take 1 tablet (1 mg total) by mouth daily.   fluticasone 50 MCG/ACT nasal spray Commonly known as:  FLONASE Place 1 spray into both nostrils 2 (two) times daily.   HYDROmorphone 2 MG tablet Commonly known as:  DILAUDID Take 1-2 tablets (2-4 mg total) by mouth every 4 (four) hours as needed for moderate pain or severe pain. What changed:  medication strength  how much to take  reasons to take this   levothyroxine 88 MCG tablet Commonly known as:  SYNTHROID, LEVOTHROID Take 88 mcg by mouth daily before breakfast.   losartan 25 MG tablet Commonly known as:  COZAAR Take 25 mg by mouth daily.   Lutein 6 MG Tabs Take 6 mg by mouth 2 (two) times daily.   magnesium gluconate 500 MG tablet Commonly known as:  MAGONATE Take 500 mg by mouth at bedtime.   meclizine 25 MG tablet Commonly known as:  ANTIVERT Take 25 mg by mouth 3 (three)  times daily as needed for dizziness.   methocarbamol 500 MG tablet Commonly known as:  ROBAXIN Take 1 tablet (500 mg total) by mouth every 6 (six) hours as needed for muscle spasms.   METRONIDAZOLE (TOPICAL) 0.75 % Lotn Apply 1 application topically at bedtime. Apply to face for rosacea   pantoprazole 40 MG tablet Commonly known as:  PROTONIX Take 40 mg by mouth 2 (two)  times daily. Before breakfast and before supper   polyethylene glycol packet Commonly known as:  MIRALAX / GLYCOLAX Take 4 g by mouth daily. 1 teaspoonful in the morning   rivaroxaban 10 MG Tabs tablet Commonly known as:  XARELTO Take 1 tablet (10 mg total) by mouth daily with breakfast. Take Xarelto for two and a half more weeks, then discontinue Xarelto. Once the patient has completed the Xarelto, they may resume the Plavix 75 mg daily at home. Start taking on:  03/14/2016   simvastatin 40 MG tablet Commonly known as:  ZOCOR Take 40 mg by mouth at bedtime.   SYSTANE ULTRA 0.4-0.3 % Soln Generic drug:  Polyethyl Glycol-Propyl Glycol Place 1-2 drops into both eyes 2 (two) times daily.   traZODone 50 MG tablet Commonly known as:  DESYREL Take 25 mg by mouth at bedtime.            Durable Medical Equipment        Start     Ordered   03/13/16 [phone]  For home use only DME Walker rolling  Once    Question:  Patient needs a walker to treat with the following condition  Answer:  OA (osteoarthritis) of hip   03/13/16 0904     Follow-up Information    [name], MD. Schedule an appointment as soon as possible for a visit on 03/25/2016.   Specialty:  Orthopedic Surgery Contact information: [address] [place] [address] [phone]           Signed: Arlee Muslim, PA-C Orthopaedic Surgery 03/13/2016, 9:49 AM

## 2016-03-13 NOTE — Evaluation (Signed)
Occupational Therapy Evaluation Patient Details Name: Jade Mooney MRN: [id number] DOB: [birth year] Today's Date: 03/13/2016    History of Present Illness Pt s/p L THR and with hx of TIA, L TKR and breast CA with R mastectomy   Clinical Impression   This 70 year old female was admitted for the above.  All education was completed. No further OT is needed at this time    Follow Up Recommendations  No OT follow up    Equipment Recommendations  None recommended by OT    Recommendations for Other Services       Precautions / Restrictions Precautions Precautions: Fall Restrictions Other Position/Activity Restrictions: WBAT      Mobility Bed Mobility         Supine to sit: Min assist     General bed mobility comments: assist for LLE  Transfers   Equipment used: Rolling walker (2 wheeled)   Sit to Stand: Min guard         General transfer comment: cues for UE placement    Balance                                            ADL Overall ADL's : Needs assistance/impaired     Grooming: Wash/dry hands;Supervision/safety;Standing                   Toilet Transfer: Min guard;Ambulation;BSC;RW   Toileting- Water quality scientist and Hygiene: Min guard;Sit to/from stand   Tub/ Shower Transfer: Walk-in shower;Min guard;Ambulation;Grab bars     General ADL Comments: practiced bathroom transfers:  simulated shorter shower ledge.  Pt has AE and has used this for ADLs recently.  (Did not use reacher, but she has one; educated on uses)     Estate agent      Pertinent Vitals/Pain Pain Score: 4  Pain Location: Lhip Pain Descriptors / Indicators: Sore Pain Intervention(s): Limited activity within patient's tolerance;Monitored during session;Premedicated before session;Repositioned;Ice applied     Hand Dominance     Extremity/Trunk Assessment Upper Extremity Assessment Upper Extremity Assessment: Overall  WFL for tasks assessed           Communication Communication Communication: No difficulties   Cognition Arousal/Alertness: Awake/alert Behavior During Therapy: WFL for tasks assessed/performed Overall Cognitive Status: Within Functional Limits for tasks assessed                     General Comments       Exercises       Shoulder Instructions      Home Living Family/patient expects to be discharged to:: Private residence Living Arrangements: Spouse/significant other Available Help at Discharge: Family               Bathroom Shower/Tub: Walk-in Corporate treasurer Toilet: Handicapped height     Home Equipment: Bedside commode;Grab bars - toilet;Grab bars - tub/shower          Prior Functioning/Environment Level of Independence: Independent with assistive device(s)        Comments: has AE for adls        OT Problem List:     OT Treatment/Interventions:      OT Goals(Current goals can be found in the care plan section) Acute Rehab OT Goals Patient Stated Goal: Regain IND and walk with less pain  than before surgery OT Goal Formulation: All assessment and education complete, DC therapy  OT Frequency:     Barriers to D/C:            Co-evaluation              End of Session    Activity Tolerance: Patient tolerated treatment well Patient left: in chair;with call bell/phone within reach;with family/visitor present   Time: 1227-1249 OT Time Calculation (min): 22 min Charges:  OT General Charges $OT Visit: 1 Procedure OT Evaluation $OT Eval Low Complexity: 1 Procedure G-Codes:    Shirrell Solinger 03-31-16, 1:10 PM  Lesle Chris, OTR/L [phone] Mar 31, 2016

## 2016-03-14 LAB — BASIC METABOLIC PANEL
Anion gap: 7 (ref 5–15)
BUN: 10 mg/dL (ref 6–20)
CO2: 26 mmol/L (ref 22–32)
Calcium: 8.4 mg/dL — ABNORMAL LOW (ref 8.9–10.3)
Chloride: 107 mmol/L (ref 101–111)
Creatinine, Ser: 0.62 mg/dL (ref 0.44–1.00)
GFR calc Af Amer: >60 mL/min (ref >60)
GFR calc non Af Amer: >60 mL/min (ref >60)
Glucose, Bld: 102 mg/dL — ABNORMAL HIGH (ref 65–99)
Potassium: 3.6 mmol/L (ref 3.5–5.1)
Sodium: 140 mmol/L (ref 135–145)

## 2016-03-14 LAB — CBC
HCT: 31.8 % — ABNORMAL LOW (ref 36.0–46.0)
Hemoglobin: 10.5 g/dL — ABNORMAL LOW (ref 12.0–15.0)
MCH: 27.6 pg (ref 26.0–34.0)
MCHC: 33 g/dL (ref 30.0–36.0)
MCV: 83.7 fL (ref 78.0–100.0)
Platelets: 207 10*3/uL (ref 150–400)
RBC: 3.8 MIL/uL — ABNORMAL LOW (ref 3.87–5.11)
RDW: 15.4 % (ref 11.5–15.5)
WBC: 9.5 10*3/uL (ref 4.0–10.5)

## 2016-03-14 NOTE — Progress Notes (Signed)
   Subjective: 2 Days Post-Op Procedure(s) (LRB): LEFT TOTAL HIP ARTHROPLASTY ANTERIOR APPROACH (Left) Patient reports pain as mild.   Patient seen in rounds with Dr. Wynelle Link. Patient is well, but has had some minor complaints of pain in the hip, requiring pain medications Patient is ready to go today.  Plan is to go straight to outpatient therapy at [facility] on 03/18/2016.  Objective: Vital signs in last 24 hours: Temp:  [97.4 F (36.3 C)-98.6 F (37 C)] 98.6 F (37 C) (12/22 0510) Pulse Rate:  [50-67] 52 (12/22 0510) Resp:  [16-18] 16 (12/22 0510) BP: (113-138)/(60-75) 138/75 (12/22 0510) SpO2:  [98 %-100 %] 99 % (12/22 0510)  Intake/Output from previous day:  Intake/Output Summary (Last 24 hours) at 03/14/16 0734 Last data filed at 03/14/16 0510  Gross per 24 hour  Intake             1020 ml  Output             3450 ml  Net            -2430 ml    Intake/Output this shift: No intake/output data recorded.  Labs:  Recent Labs  03/13/16 0426 03/14/16 0412  HGB 10.9* 10.5*    Recent Labs  03/13/16 0426 03/14/16 0412  WBC 9.7 9.5  RBC 4.00 3.80*  HCT 33.0* 31.8*  PLT 196 207    Recent Labs  03/13/16 0426 03/14/16 0412  NA 140 140  K 3.7 3.6  CL 106 107  CO2 27 26  BUN 10 10  CREATININE 0.59 0.62  GLUCOSE 121* 102*  CALCIUM 8.4* 8.4*   No results for input(s): LABPT, INR in the last 72 hours.  EXAM: General - Patient is Alert, Appropriate and Oriented Extremity - Neurovascular intact Sensation intact distally Intact pulses distally Dorsiflexion/Plantar flexion intact Incision - clean, dry, no drainage Motor Function - intact, moving foot and toes well on exam.   Assessment/Plan: 2 Days Post-Op Procedure(s) (LRB): LEFT TOTAL HIP ARTHROPLASTY ANTERIOR APPROACH (Left) Procedure(s) (LRB): LEFT TOTAL HIP ARTHROPLASTY ANTERIOR APPROACH (Left) Past Medical History:  Diagnosis Date  . Anxiety   . Cancer [facility])    Breast 1997 right tx with  mastectomy and chemo, metastatic now  . GERD (gastroesophageal reflux disease)   . Hypercholesterolemia   . Hypertension   . Hypothyroidism   . Osteoarthritis    oa  . TIA (transient ischemic attack) last 11-08-15   x 2 total   Principal Problem:   OA (osteoarthritis) of hip  Estimated body mass index is 23.85 kg/m as calculated from the following:   Height as of this encounter: 5' 3.5" (1.613 m).   Weight as of this encounter: 62.1 kg (136 lb 12.8 oz). Up with therapy Discharge home - straight to outpatient therapy Diet - Cardiac diet Follow up - in 2 weeks Activity - WBAT Disposition - Home Condition Upon Discharge - Good D/C Meds - See DC Summary DVT Prophylaxis - Xarelto  Arlee Muslim, PA-C Orthopaedic Surgery 03/14/2016, 7:34 AM

## 2016-03-14 NOTE — Progress Notes (Signed)
Physical Therapy Treatment Patient Details Name: Jade Mooney MRN: [id number] DOB: November 17, 1945 Today's Date: 03/14/2016    History of Present Illness Pt s/p L THR and with hx of TIA, L TKR and breast CA with R mastectomy    PT Comments    Pt progressing well with mobility and eager for return home.  Reviewed stairs, therex and car transfers.  Follow Up Recommendations  Home health PT     Equipment Recommendations  None recommended by PT    Recommendations for Other Services OT consult     Precautions / Restrictions Precautions Precautions: Fall Restrictions Weight Bearing Restrictions: No Other Position/Activity Restrictions: WBAT    Mobility  Bed Mobility Overal bed mobility: Needs Assistance Bed Mobility: Supine to Sit       Sit to supine: Supervision   General bed mobility comments: Increased time and min cues  Transfers Overall transfer level: Needs assistance Equipment used: Rolling walker (2 wheeled) Transfers: Sit to/from Stand Sit to Stand: Supervision         General transfer comment: cues for UE placement  Ambulation/Gait Ambulation/Gait assistance: Min guard;Supervision Ambulation Distance (Feet): 400 Feet Assistive device: Rolling walker (2 wheeled) Gait Pattern/deviations: Step-to pattern;Step-through pattern;Decreased step length - right;Decreased step length - left;Shuffle;Trunk flexed Gait velocity: decr Gait velocity interpretation: Below normal speed for age/gender General Gait Details: min cues for posture, position from RW and initial sequence   Stairs Stairs: Yes   Stair Management: No rails;One rail Left;Backwards;Forwards;With walker;With cane;Step to pattern Number of Stairs: 8 General stair comments: 5 steps fwd with cane and rail, 3 stairs with RW  Wheelchair Mobility    Modified Rankin (Stroke Patients Only)       Balance                                    Cognition Arousal/Alertness:  Awake/alert Behavior During Therapy: WFL for tasks assessed/performed Overall Cognitive Status: Within Functional Limits for tasks assessed                      Exercises Total Joint Exercises Ankle Circles/Pumps: AROM;Both;15 reps;Supine Quad Sets: AROM;Both;10 reps;Supine Heel Slides: AAROM;Left;20 reps;Supine Hip ABduction/ADduction: AAROM;Left;15 reps;Supine Long Arc Quad: AROM;Left;10 reps;Seated    General Comments        Pertinent Vitals/Pain Pain Assessment: 0-10 Pain Score: 3  Pain Location: Lhip Pain Descriptors / Indicators: Sore Pain Intervention(s): Limited activity within patient's tolerance;Monitored during session;Premedicated before session;Ice applied    Home Living                      Prior Function            PT Goals (current goals can now be found in the care plan section) Acute Rehab PT Goals Patient Stated Goal: Regain IND and walk with less pain than before surgery PT Goal Formulation: With patient Time For Goal Achievement: 03/15/16 Potential to Achieve Goals: Good Progress towards PT goals: Progressing toward goals    Frequency    7X/week      PT Plan Current plan remains appropriate    Co-evaluation             End of Session Equipment Utilized During Treatment: Gait belt Activity Tolerance: Patient tolerated treatment well Patient left: in chair;with call bell/phone within reach     Time: [id number] PT Time Calculation (min) (ACUTE ONLY): 50 min  Charges:  $Gait Training: 8-22 mins $Therapeutic Exercise: 8-22 mins $Therapeutic Activity: 8-22 mins                    G Codes:      Jade Mooney 2016-04-01, 12:02 PM

## 2016-03-14 NOTE — Progress Notes (Signed)
Pt to d/c home with Outpatient PT scheduled. Walker delivered to room prior to d/c. Prescriptions given to patient. AVS reviewed and "My Chart" discussed with pt. Pt capable of verbalizing medications, dressing changes, signs and symptoms of infection, and follow-up appointments. Remains hemodynamically stable. No signs and symptoms of distress. Educated pt to return to ER in the case of SOB, dizziness, or chest pain.

## 2016-03-18 DIAGNOSIS — Z96642 Presence of left artificial hip joint: Secondary | ICD-10-CM | POA: Diagnosis not present

## 2016-03-18 DIAGNOSIS — M25652 Stiffness of left hip, not elsewhere classified: Secondary | ICD-10-CM | POA: Diagnosis not present

## 2016-03-18 DIAGNOSIS — M6281 Muscle weakness (generalized): Secondary | ICD-10-CM | POA: Diagnosis not present

## 2016-03-18 DIAGNOSIS — R2689 Other abnormalities of gait and mobility: Secondary | ICD-10-CM | POA: Diagnosis not present

## 2016-03-20 DIAGNOSIS — M25652 Stiffness of left hip, not elsewhere classified: Secondary | ICD-10-CM | POA: Diagnosis not present

## 2016-03-20 DIAGNOSIS — Z96642 Presence of left artificial hip joint: Secondary | ICD-10-CM | POA: Diagnosis not present

## 2016-03-20 DIAGNOSIS — M6281 Muscle weakness (generalized): Secondary | ICD-10-CM | POA: Diagnosis not present

## 2016-03-20 DIAGNOSIS — R2689 Other abnormalities of gait and mobility: Secondary | ICD-10-CM | POA: Diagnosis not present

## 2016-03-25 DIAGNOSIS — M6281 Muscle weakness (generalized): Secondary | ICD-10-CM | POA: Diagnosis not present

## 2016-03-25 DIAGNOSIS — R2689 Other abnormalities of gait and mobility: Secondary | ICD-10-CM | POA: Diagnosis not present

## 2016-03-25 DIAGNOSIS — Z96642 Presence of left artificial hip joint: Secondary | ICD-10-CM | POA: Diagnosis not present

## 2016-03-25 DIAGNOSIS — M25652 Stiffness of left hip, not elsewhere classified: Secondary | ICD-10-CM | POA: Diagnosis not present

## 2016-03-28 DIAGNOSIS — M25652 Stiffness of left hip, not elsewhere classified: Secondary | ICD-10-CM | POA: Diagnosis not present

## 2016-03-28 DIAGNOSIS — M6281 Muscle weakness (generalized): Secondary | ICD-10-CM | POA: Diagnosis not present

## 2016-03-28 DIAGNOSIS — Z471 Aftercare following joint replacement surgery: Secondary | ICD-10-CM | POA: Diagnosis not present

## 2016-03-28 DIAGNOSIS — Z96642 Presence of left artificial hip joint: Secondary | ICD-10-CM | POA: Diagnosis not present

## 2016-03-28 DIAGNOSIS — R2689 Other abnormalities of gait and mobility: Secondary | ICD-10-CM | POA: Diagnosis not present

## 2016-04-01 DIAGNOSIS — M25652 Stiffness of left hip, not elsewhere classified: Secondary | ICD-10-CM | POA: Diagnosis not present

## 2016-04-01 DIAGNOSIS — M6281 Muscle weakness (generalized): Secondary | ICD-10-CM | POA: Diagnosis not present

## 2016-04-01 DIAGNOSIS — Z96642 Presence of left artificial hip joint: Secondary | ICD-10-CM | POA: Diagnosis not present

## 2016-04-01 DIAGNOSIS — R2689 Other abnormalities of gait and mobility: Secondary | ICD-10-CM | POA: Diagnosis not present

## 2016-04-04 ENCOUNTER — Ambulatory Visit (HOSPITAL_COMMUNITY)
Admission: RE | Admit: 2016-04-04 | Discharge: 2016-04-04 | Disposition: A | Discharge status: Home / Self Care | Payer: Medicare Other | Source: Ambulatory Visit | Attending: Oncology | Admitting: Oncology

## 2016-04-04 DIAGNOSIS — C7951 Secondary malignant neoplasm of bone: Secondary | ICD-10-CM | POA: Insufficient documentation

## 2016-04-04 DIAGNOSIS — Z9889 Other specified postprocedural states: Secondary | ICD-10-CM | POA: Insufficient documentation

## 2016-04-04 DIAGNOSIS — Z170 Estrogen receptor positive status [ER+]: Secondary | ICD-10-CM | POA: Diagnosis not present

## 2016-04-04 DIAGNOSIS — C50911 Malignant neoplasm of unspecified site of right female breast: Secondary | ICD-10-CM | POA: Diagnosis not present

## 2016-04-04 LAB — GLUCOSE, CAPILLARY: Glucose-Capillary: 90 mg/dL (ref 65–99)

## 2016-04-04 MED ORDER — FLUDEOXYGLUCOSE F - 18 (FDG) INJECTION
7.2400 | Freq: Once | INTRAVENOUS | Status: AC | PRN
  Administered 2016-04-04: 7.24 via INTRAVENOUS

## 2016-04-07 ENCOUNTER — Encounter: Payer: Self-pay | Admitting: Oncology

## 2016-04-07 DIAGNOSIS — E538 Deficiency of other specified B group vitamins: Secondary | ICD-10-CM | POA: Diagnosis not present

## 2016-04-09 ENCOUNTER — Telehealth: Payer: Self-pay | Admitting: Oncology

## 2016-04-09 NOTE — Telephone Encounter (Signed)
Pt called to r/s appts due to weather. Gave pt new appt date/time per request

## 2016-04-10 ENCOUNTER — Other Ambulatory Visit: Payer: Medicare Other

## 2016-04-10 ENCOUNTER — Ambulatory Visit: Payer: Medicare Other | Admitting: Oncology

## 2016-04-10 ENCOUNTER — Ambulatory Visit: Payer: Medicare Other

## 2016-04-18 ENCOUNTER — Other Ambulatory Visit: Payer: Self-pay | Admitting: *Deleted

## 2016-04-18 DIAGNOSIS — C50912 Malignant neoplasm of unspecified site of left female breast: Secondary | ICD-10-CM

## 2016-04-18 DIAGNOSIS — C7951 Secondary malignant neoplasm of bone: Principal | ICD-10-CM

## 2016-04-19 ENCOUNTER — Other Ambulatory Visit: Payer: Self-pay | Admitting: *Deleted

## 2016-04-21 ENCOUNTER — Ambulatory Visit (HOSPITAL_BASED_OUTPATIENT_CLINIC_OR_DEPARTMENT_OTHER): Payer: Medicare Other | Admitting: Oncology

## 2016-04-21 ENCOUNTER — Other Ambulatory Visit (HOSPITAL_BASED_OUTPATIENT_CLINIC_OR_DEPARTMENT_OTHER): Payer: Medicare Other

## 2016-04-21 ENCOUNTER — Telehealth: Payer: Self-pay | Admitting: Pharmacist

## 2016-04-21 ENCOUNTER — Ambulatory Visit (HOSPITAL_BASED_OUTPATIENT_CLINIC_OR_DEPARTMENT_OTHER): Payer: Medicare Other

## 2016-04-21 VITALS — BP 172/82 | HR 67 | Temp 98.0°F | Resp 18 | Ht 63.5 in | Wt 138.5 lb

## 2016-04-21 DIAGNOSIS — C50811 Malignant neoplasm of overlapping sites of right female breast: Secondary | ICD-10-CM | POA: Insufficient documentation

## 2016-04-21 DIAGNOSIS — M899 Disorder of bone, unspecified: Secondary | ICD-10-CM | POA: Diagnosis not present

## 2016-04-21 DIAGNOSIS — C7951 Secondary malignant neoplasm of bone: Secondary | ICD-10-CM

## 2016-04-21 DIAGNOSIS — C7989 Secondary malignant neoplasm of other specified sites: Secondary | ICD-10-CM

## 2016-04-21 DIAGNOSIS — Z170 Estrogen receptor positive status [ER+]: Secondary | ICD-10-CM | POA: Insufficient documentation

## 2016-04-21 DIAGNOSIS — M25552 Pain in left hip: Secondary | ICD-10-CM | POA: Diagnosis not present

## 2016-04-21 DIAGNOSIS — C50919 Malignant neoplasm of unspecified site of unspecified female breast: Secondary | ICD-10-CM

## 2016-04-21 DIAGNOSIS — C50912 Malignant neoplasm of unspecified site of left female breast: Secondary | ICD-10-CM

## 2016-04-21 DIAGNOSIS — C44501 Unspecified malignant neoplasm of skin of breast: Secondary | ICD-10-CM

## 2016-04-21 DIAGNOSIS — Z7189 Other specified counseling: Secondary | ICD-10-CM

## 2016-04-21 LAB — CBC WITH DIFFERENTIAL/PLATELET
BASO%: 0.9 % (ref 0.0–2.0)
Basophils Absolute: 0.1 10*3/uL (ref 0.0–0.1)
EOS%: 1 % (ref 0.0–7.0)
Eosinophils Absolute: 0.1 10*3/uL (ref 0.0–0.5)
HCT: 41.4 % (ref 34.8–46.6)
HGB: 13.7 g/dL (ref 11.6–15.9)
LYMPH%: 11.9 % — ABNORMAL LOW (ref 14.0–49.7)
MCH: 27.5 pg (ref 25.1–34.0)
MCHC: 33.2 g/dL (ref 31.5–36.0)
MCV: 82.8 fL (ref 79.5–101.0)
MONO#: 0.5 10*3/uL (ref 0.1–0.9)
MONO%: 7.8 % (ref 0.0–14.0)
NEUT#: 5.5 10*3/uL (ref 1.5–6.5)
NEUT%: 78.4 % — ABNORMAL HIGH (ref 38.4–76.8)
Platelets: 251 10*3/uL (ref 145–400)
RBC: 5 10*6/uL (ref 3.70–5.45)
RDW: 15.9 % — ABNORMAL HIGH (ref 11.2–14.5)
WBC: 7 10*3/uL (ref 3.9–10.3)
lymph#: 0.8 10*3/uL — ABNORMAL LOW (ref 0.9–3.3)

## 2016-04-21 LAB — COMPREHENSIVE METABOLIC PANEL
ALT: 11 U/L (ref 0–55)
AST: 17 U/L (ref 5–34)
Albumin: 4.5 g/dL (ref 3.5–5.0)
Alkaline Phosphatase: 94 U/L (ref 40–150)
Anion Gap: 12 mEq/L — ABNORMAL HIGH (ref 3–11)
BUN: 13.1 mg/dL (ref 7.0–26.0)
CO2: 25 mEq/L (ref 22–29)
Calcium: 9.4 mg/dL (ref 8.4–10.4)
Chloride: 102 mEq/L (ref 98–109)
Creatinine: 0.8 mg/dL (ref 0.6–1.1)
EGFR: 78 mL/min/{1.73_m2} — ABNORMAL LOW (ref >90)
Glucose: 82 mg/dl (ref 70–140)
Potassium: 4 mEq/L (ref 3.5–5.1)
Sodium: 138 mEq/L (ref 136–145)
Total Bilirubin: 0.44 mg/dL (ref 0.20–1.20)
Total Protein: 7.1 g/dL (ref 6.4–8.3)

## 2016-04-21 MED ORDER — PALBOCICLIB 125 MG PO CAPS: 125.0000 mg | ORAL_CAPSULE | Freq: Every day | ORAL | 6 refills | Status: DC

## 2016-04-21 MED ORDER — TRAMADOL HCL 50 MG PO TABS: 50.0000 mg | ORAL_TABLET | Freq: Four times a day (QID) | ORAL | 3 refills | Status: DC | PRN

## 2016-04-21 MED ORDER — ZOLEDRONIC ACID 4 MG/100ML IV SOLN
4.0000 mg | Freq: Once | INTRAVENOUS | Status: AC
  Administered 2016-04-21: 4 mg via INTRAVENOUS
  Filled 2016-04-21: qty 100

## 2016-04-21 MED ORDER — SODIUM CHLORIDE 0.9 % IV SOLN
Freq: Once | INTRAVENOUS | Status: AC
  Administered 2016-04-21: 11:00:00 via INTRAVENOUS

## 2016-04-21 NOTE — Telephone Encounter (Signed)
Oral Chemotherapy Pharmacist Encounter  Received notification from [facility] that Ibrance prescription would require prior authorization. Oral Oncology Clinic was not involved in sending prescription to filling pharmacy.  Labs from 04/21/16 reviewed, OK for Ibrance treatment Current medication list in Epic assessed, some DDIs identified:  Ibrance and simvastatin: Category D interaction: Ibrance inhibits one of the enzymes responsible for the metabolism of simvastatin (CYP3A4) and may lead to increased exposure to the simvastatin. No change to therapy is indicated at this time. This will be monitored.  Ibrance and pantoprazole: Category B interaction: pantoprazole has the potential to decrease absorption of the Ibrance and lead to decreased Ibrance exposure. Due to [facility] being administered with food, this interaction is anticipated to be minimal and no changes to therapy are indicated at this time.  Prior authorization submitted on CoverMyMeds [place] Status is pending  Oral Oncology Clinic will continue to follow.  Johny Drilling, PharmD, BCPS, BCOP 04/21/2016  3:29 PM Oral Oncology Clinic [phone]

## 2016-04-21 NOTE — Patient Instructions (Signed)

## 2016-04-21 NOTE — Progress Notes (Signed)
[facility]  [phone] [phone]   ID: Jade Mooney DOB: 1946-02-24  MR#: [id number]  YDT#:[id number]  PCP: Lucila Maine, MD GYN: Tracey Harries MD SU:  OTHER MD: Vida Rigger MD, Reginia Naas M.D., Lance Bosch MD  CHIEF COMPLAINT: Newly diagnosed metastatic breast cancer  CURRENT TREATMENT: Zolendronate, anastrozole  BREAST CANCER HISTORY: From the earlier summary notes:  Jade Mooney was referred to Dr. Vida Rigger for evaluation of abdominal discomfort and nausea. Exam and blood work including liver function tests was unremarkable aside from normochromic normocytic anemia. Cholecystitis was suspected and the patient underwent endoscopy followed by abdominal/ pelvic CT scan on 09/29/2013. This showed showed a normal gallbladder. Incidental findings included multiple simple cysts in the liver and aortic atherosclerosis. However mixed lytic and sclerotic bony lesions were noted. On 10/10/2013 the patient underwent biopsy of the right iliac bone, and this showed ([id number]) metastatic invasive ductal carcinoma, grade 2, estrogen and progesterone receptor strongly positive.  Jade Mooney has a remote history of breast cancer, which we tried to reconstruct. She was originally diagnosed early in 1997 with stage III disease, and underwent right mastectomy followed by adjuvant chemotherapy. She then participated in a [facility] protocol with high-dose chemotherapy followed by stem cell rescue 12/22/1995. Unfortunately later that year liver lesions were noted and were biopsy-proven to be metastatic breast cancer. This was not felt to be resectable. However the tumor was estrogen receptor positive and HER-2 positive. The patient was treated with Herceptin (she does not know for what period of time) and was then on tamoxifen until November of 2002. She also participated in a vaccine study at [facility].  Jade Mooney's subsequent history is as detailed below  INTERVAL HISTORY: Jade Mooney  returns today for follow-up of her metastatic estrogen receptor positive breast cancer, accompanied by her husband, Jade Mooney. Since her last visit here Jade Mooney underwent left total hip replacement under Dr. Lequita Halt. She tells me the surgery was "great", and specifically her significant left hip pain has resolved. She went off diluted and is only using Tylenol for pain at present. She took several to of course for a couple of weeks but now is off that medication. She has a little bit of soreness in the hip at times and she has some pain in the left knee but otherwise that was very successful.  She also underwent treatment of the area in the right anterior lower chest where she had her skin recurrence removed. She completed those treatments 02/25/2017. She had some fatigue and some erythema but no other side effects from those treatments.  She continues on anastrozole, with good tolerance. Hot flashes and vaginal dryness are not a major issue. She never developed the arthralgias or myalgias that many patients can experience on this medication. She obtains it at a good price.  We have just restage her with a PET scan. That does show 2 new bone spots confirming disease progression. On the other hand it doesn't show any evidence of liver or lung involvement.  REVIEW OF SYSTEMS: After going off the diluted she started experiencing restless legs at night. This can last several hours and is interrupting her sleep significantly. She is trying a homeopathic herbal remedy which is helping. She is not able to walk long distances yet but has completed her rehabilitation area she has a little bit of a runny nose and occasionally soft loose bowel movements. Aside from these issues a detailed review of systems today was stable  PAST MEDICAL HISTORY: Past Medical History:  Diagnosis Date  . Anxiety   . Cancer [place])    Breast 1997 right tx with mastectomy and chemo, metastatic now  . GERD (gastroesophageal reflux disease)    . Hypercholesterolemia   . Hypertension   . Hypothyroidism   . Osteoarthritis    oa  . TIA (transient ischemic attack) last 11-08-15   x 2 total    PAST SURGICAL HISTORY: Past Surgical History:  Procedure Laterality Date  . CATARACT EXTRACTION, BILATERAL    . MASTECTOMY MODIFIED RADICAL Right 1997  . porta cath insertion  1997   later removed  . radiation tx  finished 02-25-16   x 20 tx  . TONSILLECTOMY    . TOTAL HIP ARTHROPLASTY Left 03/12/2016   Procedure: LEFT TOTAL HIP ARTHROPLASTY ANTERIOR APPROACH;  Surgeon: Jade Arabian, MD;  Location: [facility];  Service: Orthopedics;  Laterality: Left;  . TOTAL KNEE ARTHROPLASTY Left   . TUBAL LIGATION      FAMILY HISTORY No family history on file. The patient's father died at the age of 24 from heart disease. The patient's mother died at the age of 49. The patient had no brothers, one sister. One of the patient's mother's 5 sisters was diagnosed with breast cancer in her 48s  GYNECOLOGIC HISTORY:  No LMP recorded. Patient is postmenopausal. Menarche age 71, the patient is GX P0. She stopped having periods approximately 1994. She used hormone replacement until 1997. 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 Her husband Jade Mooney (goes by "Jade Mooney" was [employer]. of information all services at [employer]. They live alone, with no pets.    ADVANCED DIRECTIVES: In place   HEALTH MAINTENANCE: Social History  Substance Use Topics  . Smoking status: Never Smoker  . Smokeless tobacco: Never Used  . Alcohol use Yes     Comment: 1 glass wine 4-5 x week     Colonoscopy: 2013  PAP: 2010  Bone density: 2012  Lipid panel:  Allergies  Allergen Reactions  . Amoxicillin Hives    Has patient had a PCN reaction causing immediate rash, facial/tongue/throat swelling, SOB or lightheadedness with hypotension:unsure Has patient had a PCN  reaction causing severe rash involving mucus membranes or skin necrosis:No Has patient had a PCN reaction that required hospitalization:No Has patient had a PCN reaction occurring within the last 10 years: Yes If all of the above answers are "NO", then may proceed with Cephalosporin use.   Marland Kitchen Percocet [Oxycodone-Acetaminophen] Nausea And Vomiting  . Percodan [Oxycodone-Aspirin] Nausea And Vomiting    Current Outpatient Prescriptions  Medication Sig Dispense Refill  . acetaminophen (TYLENOL) 500 MG tablet Take 1,000 mg by mouth every 6 (six) hours as needed for mild pain. Reported on 04/17/2015    . anastrozole (ARIMIDEX) 1 MG tablet Take 1 tablet (1 mg total) by mouth daily. 90 tablet 3  . fluticasone (FLONASE) 50 MCG/ACT nasal spray Place 1 spray into both nostrils 2 (two) times daily.     Marland Kitchen levothyroxine (SYNTHROID, LEVOTHROID) 88 MCG tablet Take 88 mcg by mouth daily before breakfast.    . losartan (COZAAR) 25 MG tablet Take 25 mg by mouth daily.    . Lutein 6 MG TABS Take 6 mg by mouth 2 (two) times daily.     . magnesium gluconate (MAGONATE) 500 MG tablet Take 500 mg by mouth at bedtime.    . meclizine (ANTIVERT) 25 MG tablet  Take 25 mg by mouth 3 (three) times daily as needed for dizziness.    Marland Kitchen METRONIDAZOLE, TOPICAL, 0.75 % LOTN Apply 1 application topically at bedtime. Apply to face for rosacea    . palbociclib (IBRANCE) 125 MG capsule Take 1 capsule (125 mg total) by mouth daily with breakfast. Take whole with food. 21 capsule 6  . pantoprazole (PROTONIX) 40 MG tablet Take 40 mg by mouth 2 (two) times daily. Before breakfast and before supper    . Polyethyl Glycol-Propyl Glycol (SYSTANE ULTRA) 0.4-0.3 % SOLN Place 1-2 drops into both eyes 2 (two) times daily.    . polyethylene glycol (MIRALAX / GLYCOLAX) packet Take 4 g by mouth daily. 1 teaspoonful in the morning    . simvastatin (ZOCOR) 40 MG tablet Take 40 mg by mouth at bedtime.     . traMADol (ULTRAM) 50 MG tablet Take 1 tablet  (50 mg total) by mouth every 6 (six) hours as needed. 60 tablet 3  . traZODone (DESYREL) 50 MG tablet Take 25 mg by mouth at bedtime.      No current facility-administered medications for this visit.    Facility-Administered Medications Ordered in Other Visits  Medication Dose Route Frequency Provider Last Rate Last Dose  . 0.9 %  sodium chloride infusion   Intravenous Once Chauncey Cruel, MD      . Zoledronic Acid (ZOMETA) 4 mg IVPB  4 mg Intravenous Once Chauncey Cruel, MD        OBJECTIVE: Middle-aged white woman Who appears stated age 70:   04/21/16 0926  BP: (!) 172/82  Pulse: 67  Resp: 18  Temp: 98 F (36.7 C)     Body mass index is 24.15 kg/m.    ECOG FS:1 - Symptomatic but completely ambulatory  Sclerae unicteric, EOMs intact Oropharynx clear and moist No cervical or supraclavicular adenopathy Lungs no rales or rhonchi Heart regular rate and rhythm Abd soft, nontender, positive bowel sounds MSK no focal spinal tenderness, no left lower extremity lymphedema Neuro: nonfocal, well oriented, appropriate affect Breasts: Status post right mastectomy. The area in the lower right anterior chest wall where she had the skin recurrences status post radiation. There is minimal hyperpigmentation. There is no erythema or desquamation. The right axilla is benign. The left breast is unremarkable.   LAB RESULTS: CMP     Component Value Date/Time   NA 138 04/21/2016 0907   K 4.0 04/21/2016 0907   CL 107 03/14/2016 0412   CO2 25 04/21/2016 0907   GLUCOSE 82 04/21/2016 0907   BUN 13.1 04/21/2016 0907   CREATININE 0.8 04/21/2016 0907   CALCIUM 9.4 04/21/2016 0907   PROT 7.1 04/21/2016 0907   ALBUMIN 4.5 04/21/2016 0907   AST 17 04/21/2016 0907   ALT 11 04/21/2016 0907   ALKPHOS 94 04/21/2016 0907   BILITOT 0.44 04/21/2016 0907   GFRNONAA >60 03/14/2016 0412   GFRAA >60 03/14/2016 0412    I No results found for: SPEP  Lab Results  Component Value Date   WBC 7.0  04/21/2016   NEUTROABS 5.5 04/21/2016   HGB 13.7 04/21/2016   HCT 41.4 04/21/2016   MCV 82.8 04/21/2016   PLT 251 04/21/2016      Chemistry      Component Value Date/Time   NA 138 04/21/2016 0907   K 4.0 04/21/2016 0907   CL 107 03/14/2016 0412   CO2 25 04/21/2016 0907   BUN 13.1 04/21/2016 0907   CREATININE 0.8 04/21/2016 9767  Component Value Date/Time   CALCIUM 9.4 04/21/2016 0907   ALKPHOS 94 04/21/2016 0907   AST 17 04/21/2016 0907   ALT 11 04/21/2016 0907   BILITOT 0.44 04/21/2016 0907      CA 27.29 0.0 - 38.6 U/mL 34.2 34.9CM         No results for input(s): INR in the last 168 hours.  Urinalysis    Component Value Date/Time   COLORURINE STRAW (A) 03/05/2016 1350    STUDIES: Nm Pet Image Restag (ps) Skull Base To Thigh  Result Date: 04/04/2016 CLINICAL DATA:  Subsequent treatment strategy for RIGHT breast carcinoma. EXAM: NUCLEAR MEDICINE PET SKULL BASE TO THIGH TECHNIQUE: 7.24 mCi F-18 FDG was injected intravenously. Full-ring PET imaging was performed from the skull base to thigh after the radiotracer. CT data was obtained and used for attenuation correction and anatomic localization. FASTING BLOOD GLUCOSE:  Value: 90 mg/dl COMPARISON:  None. FINDINGS: NECK No hypermetabolic lymph nodes in the neck. CHEST No hypermetabolic mediastinal or hilar nodes. No suspicious pulmonary nodules on the CT scan. Post RIGHT mastectomy anatomy.  No axillary adenopathy ABDOMEN/PELVIS No abnormal hypermetabolic activity within the liver, pancreas, adrenal glands, or spleen. No hypermetabolic lymph nodes in the abdomen or pelvis. Uterus and ovaries normal. SKELETON Widespread sclerotic skeletal metastasis the vast majority of which are not metabolic. No change in sclerotic pattern. Two new foci of metabolic activity on the background of widespread dense sclerotic metastasis. One new foci of metabolic activity within T8 vertebral body (image 66 of the fused data set) with SUV max  equal 4.7. Second focus of increased metabolic activities within the manubrium with SUV max equal 3.8. Uptake associated with the LEFT hip prosthetic is favored benign postsurgical change. IMPRESSION: 1. Two new foci of metabolic bone metastasis (T8 and manubrium) on the background of diffuse widespread non metabolic skeletal sclerotic metastasis. 2. No evidence of soft tissue metastasis. 3. RIGHT mastectomy anatomy. Electronically Signed   By: Genevive Bi M.D.   On: 04/04/2016 11:57     ASSESSMENT: 71 y.o. [place] woman with stage IV right breast cancer (bone metastatic disease, subcutaneous nodules)  (1) s/p Right mastectomy 1997 for an estrogen receptor and HER-2 positive breast cancer, treated with  (a) adjuvant chemotherapy according to CALGB 9640 (taxotere x 4 cycles)  (b) high-dose chemotherapy (cyclophosphamide, carboplatin, BCNU) followed by stem cell rescue at [facility] 12/22/1995  (c) biopsy-proven metastatic disease November 2007, estrogen receptor and HER-2 positive  (d) trastuzumab (dose? Duration?)  (e) tamoxifen for 5 years completed 2002  (f) participation in a vaccine study at [facility] 2003 (CEA primed dendritic cells, HER-2 primed dendritic cells)    (2) status post right iliac crest biopsy 10/10/2013 for invasive ductal carcinoma, grade 2, estrogen and progesterone receptor positive  (3) zolendronate started 10/12/2013,  repeated every 12 weeks  (a) dexa scan 07/28/2013 normal  (4) biopsy of a scalp lesion 10/17/2013 showed metastatic breast cancer, HER-2/neu negative  (a) biopsy of the right anterior chest wall nodule August 2017 shows adenocarcinoma  (5) anastrozole started 10/28/2013  (a) measurable disease = subcutaneous nodules in scalp and LLQ abdomen  (b)  CA-27-29 is informative  (c)  Repeat PET scan 12/28/2014 shows continuing response ; exam shows resolution of scalp lesions  (d) new skin lesion removed from right anterior chest wall August 2017  (e) status post  radiation to the anterior lower right chest wall completed 02/25/2017 (20 sessions)   (6) neoplasia associated pain  (a) painful blastic lesion left  femoral intertrochanteric region: Status post radiation completed November 2015  (b) pain left knee, status post TKR remotely  (c) left total hip replacement 03/12/2016  PLAN: Jenet did remarkably well with her left hip replacement. She is walking much better and she is off narcotics.  She does have some soreness in the left hip and problems with the left knee. She is using Tylenol fairly successfully. If she needs anything stronger she can use tramadol and I was glad to write her a prescription for that today.  As far as her restless legs problem I referred her back to her primary care physician.  We discussed her overall situation for well over 30 minutes today most of it spent in counseling. She understands there is evidence of disease progression although he still do not see evidence of involvement of her lungs or liver. The PET scan in particular does show 2 new bone spots.  Accordingly I do think we need to intensify her systemic treatment. She is going to continue the anastrozole for now, but when she runs out of her current dose we will switch her to letrozole.  More importantly we are going to start her on palbociclib/Ibrance. We discussed the possible toxicities, side effects and complications of this agent in detail today and I have put in the prescription. She understands this is difficult drug to obtain an hour oral chemotherapy pharmacist will be involved area hopefully she will be able to obtain it within the next week or so. Once she does we will need to start checking her CBC and differential every 2 weeks. She would like to do that locally of course and I have written her a prescription so that she will have a CBC drawn every 14 days beginning after the first day she takes the pill and repeated 6. They will fax me those results and  we will adjust the dose appropriately.  I have made her a return appointment with me for March 5 just to make sure that everything is in place and that she is taking her treatments as prescribed.  Otherwise she is continuing Zometa every 3 months, with dose due today  She knows to call for any problems that may develop before the next visit here.  Chauncey Cruel, MD   04/21/2016 10:26 AM

## 2016-04-22 DIAGNOSIS — Z96642 Presence of left artificial hip joint: Secondary | ICD-10-CM | POA: Diagnosis not present

## 2016-04-22 DIAGNOSIS — Z471 Aftercare following joint replacement surgery: Secondary | ICD-10-CM | POA: Diagnosis not present

## 2016-04-22 LAB — CANCER ANTIGEN 27.29: CA 27.29: 67.3 U/mL — ABNORMAL HIGH (ref 0.0–38.6)

## 2016-04-22 MED FILL — IBRANCE 125 MG CAPSULE: 125 | 21 days supply | Qty: 21 | Fill #0

## 2016-04-24 DIAGNOSIS — G2581 Restless legs syndrome: Secondary | ICD-10-CM | POA: Diagnosis not present

## 2016-04-24 DIAGNOSIS — Z1389 Encounter for screening for other disorder: Secondary | ICD-10-CM | POA: Diagnosis not present

## 2016-04-24 NOTE — Telephone Encounter (Signed)
Oral Chemotherapy Pharmacist Encounter  Received notification from [employer] that prior authorization for patient's Jade Mooney has been approved [id number] Effective dates: 04/21/16-03/23/17  I called [facility] to have them process prescription. Patient's copay [id number]  I called and spoke with patient about copayment and options for assistance. Patient's income is too high to qualify for a grant from a foundation. Patient may be eligible for assistance through [place] depending on income amount. [name] will call me tomorrow (2/2) with income information and to discuss a plan for medication acquisition.  Oral Oncology Clinic will continue to follow.  Johny Drilling, PharmD, BCPS, BCOP 04/24/2016  1:07 PM Oral Oncology Clinic [phone]

## 2016-04-29 NOTE — Telephone Encounter (Signed)
Oral Chemotherapy Pharmacist Encounter  I spoke with patient for overview of new oral chemotherapy medication: Ibrance.   Patient has high copay. We were able to use Ibrance voucher for 1 free month of medication so the patient did not have a delay in the start of therapy. Start date: 04/28/16  Counseled patient on administration, dosing, side effects, safe handling, and monitoring. Side effects include but not limited to: fatigue, stomach upset, decreased blood counts, alopecia.  Mrs. Mell voiced understanding and appreciation.   All questions answered.  Patient will come to [facility] on 2/8 in am to complete application for [employer] to try to obtain free medication from the manufacturer. A separate encounter will be used for documentation of [place] application.  Thank you,  Johny Drilling, PharmD, BCPS, BCOP 04/29/2016  4:51 PM Oral Oncology Clinic [phone]

## 2016-05-05 ENCOUNTER — Telehealth: Payer: Self-pay | Admitting: Pharmacist

## 2016-05-05 NOTE — Telephone Encounter (Signed)
Oral Chemotherapy Pharmacist Encounter  [employer] patient assistance program application completed and faxed to [phone].  This encounter will continue to be updated until final determination.  Oral Oncology Clinic will continue to follow.   Johny Drilling, PharmD, BCPS, BCOP 05/05/2016  10:18 AM Oral Oncology Clinic [phone]

## 2016-05-07 DIAGNOSIS — E538 Deficiency of other specified B group vitamins: Secondary | ICD-10-CM | POA: Diagnosis not present

## 2016-05-12 DIAGNOSIS — D510 Vitamin B12 deficiency anemia due to intrinsic factor deficiency: Secondary | ICD-10-CM | POA: Diagnosis not present

## 2016-05-13 DIAGNOSIS — Z471 Aftercare following joint replacement surgery: Secondary | ICD-10-CM | POA: Diagnosis not present

## 2016-05-13 DIAGNOSIS — Z96642 Presence of left artificial hip joint: Secondary | ICD-10-CM | POA: Diagnosis not present

## 2016-05-15 ENCOUNTER — Telehealth: Payer: Self-pay | Admitting: *Deleted

## 2016-05-15 NOTE — Telephone Encounter (Signed)
Received per fax CBC/diff from primary MD   Lab draw 05/12/2016 at [facility] in [place] - contact number of [phone] WBC 2.2 ANC/GRAN 1.5  Today is day 12 of 21 day cycle.  Pt spoke with Emeline Darling in phx.  Results will be given to MD for review.

## 2016-05-15 NOTE — Telephone Encounter (Signed)
Oral Chemotherapy Pharmacist Encounter  Received call from patient stating she had been contacted by [employer] to say she has been DENIED for enrollment into their program due to being above income requirement of 500% of the [employer]. We can appeal decision with a list of monthly expenses and a letter of hardship. [name] will draft these documents now and let me know how I can help get this information to [employer].  In the meantime, I will plan to provide samples to patient on 05/26/16 (start of 2nd cycle) when she is in the office for lab check and MD visit. Patient will also call her insurance to investigate cheaper copayment options.  This encounter will continue to be updated until final determination.  Oral Oncology Clinic will continue to follow.  Johny Drilling, PharmD, BCPS, BCOP 05/15/2016  10:48 AM Oral Oncology Clinic [phone]

## 2016-05-26 ENCOUNTER — Other Ambulatory Visit (HOSPITAL_BASED_OUTPATIENT_CLINIC_OR_DEPARTMENT_OTHER): Payer: Medicare Other

## 2016-05-26 ENCOUNTER — Other Ambulatory Visit: Payer: Self-pay | Admitting: Pharmacist

## 2016-05-26 ENCOUNTER — Encounter: Payer: Self-pay | Admitting: Pharmacist

## 2016-05-26 ENCOUNTER — Ambulatory Visit (HOSPITAL_BASED_OUTPATIENT_CLINIC_OR_DEPARTMENT_OTHER): Payer: Medicare Other | Admitting: Oncology

## 2016-05-26 VITALS — BP 135/59 | HR 66 | Temp 97.7°F | Resp 18 | Ht 63.5 in | Wt 142.2 lb

## 2016-05-26 DIAGNOSIS — C50811 Malignant neoplasm of overlapping sites of right female breast: Secondary | ICD-10-CM

## 2016-05-26 DIAGNOSIS — C7951 Secondary malignant neoplasm of bone: Secondary | ICD-10-CM | POA: Diagnosis not present

## 2016-05-26 DIAGNOSIS — Z170 Estrogen receptor positive status [ER+]: Secondary | ICD-10-CM

## 2016-05-26 DIAGNOSIS — C50119 Malignant neoplasm of central portion of unspecified female breast: Secondary | ICD-10-CM

## 2016-05-26 DIAGNOSIS — C50911 Malignant neoplasm of unspecified site of right female breast: Secondary | ICD-10-CM

## 2016-05-26 DIAGNOSIS — C44501 Unspecified malignant neoplasm of skin of breast: Secondary | ICD-10-CM | POA: Diagnosis not present

## 2016-05-26 DIAGNOSIS — M25562 Pain in left knee: Secondary | ICD-10-CM

## 2016-05-26 DIAGNOSIS — C7989 Secondary malignant neoplasm of other specified sites: Secondary | ICD-10-CM

## 2016-05-26 DIAGNOSIS — D709 Neutropenia, unspecified: Secondary | ICD-10-CM | POA: Diagnosis not present

## 2016-05-26 LAB — CBC WITH DIFFERENTIAL/PLATELET
BASO%: 2.8 % — ABNORMAL HIGH (ref 0.0–2.0)
Basophils Absolute: 0.1 10*3/uL (ref 0.0–0.1)
EOS%: 0.5 % (ref 0.0–7.0)
Eosinophils Absolute: 0 10*3/uL (ref 0.0–0.5)
HCT: 39.6 % (ref 34.8–46.6)
HGB: 12.7 g/dL (ref 11.6–15.9)
LYMPH%: 30.7 % (ref 14.0–49.7)
MCH: 27.5 pg (ref 25.1–34.0)
MCHC: 32.1 g/dL (ref 31.5–36.0)
MCV: 85.9 fL (ref 79.5–101.0)
MONO#: 0.3 10*3/uL (ref 0.1–0.9)
MONO%: 15.1 % — ABNORMAL HIGH (ref 0.0–14.0)
NEUT#: 1.1 10*3/uL — ABNORMAL LOW (ref 1.5–6.5)
NEUT%: 50.9 % (ref 38.4–76.8)
Platelets: 190 10*3/uL (ref 145–400)
RBC: 4.61 10*6/uL (ref 3.70–5.45)
RDW: 18 % — ABNORMAL HIGH (ref 11.2–14.5)
WBC: 2.1 10*3/uL — ABNORMAL LOW (ref 3.9–10.3)
lymph#: 0.7 10*3/uL — ABNORMAL LOW (ref 0.9–3.3)

## 2016-05-26 LAB — COMPREHENSIVE METABOLIC PANEL WITH GFR
ALT: 13 U/L (ref 0–55)
AST: 16 U/L (ref 5–34)
Albumin: 4.4 g/dL (ref 3.5–5.0)
Alkaline Phosphatase: 72 U/L (ref 40–150)
Anion Gap: 10 meq/L (ref 3–11)
BUN: 13.2 mg/dL (ref 7.0–26.0)
CO2: 27 meq/L (ref 22–29)
Calcium: 9.3 mg/dL (ref 8.4–10.4)
Chloride: 103 meq/L (ref 98–109)
Creatinine: 0.8 mg/dL (ref 0.6–1.1)
EGFR: 75 mL/min/{1.73_m2} — ABNORMAL LOW
Glucose: 91 mg/dL (ref 70–140)
Potassium: 3.8 meq/L (ref 3.5–5.1)
Sodium: 140 meq/L (ref 136–145)
Total Bilirubin: 0.34 mg/dL (ref 0.20–1.20)
Total Protein: 6.8 g/dL (ref 6.4–8.3)

## 2016-05-26 MED ORDER — PALBOCICLIB 100 MG PO CAPS: 100.0000 mg | ORAL_CAPSULE | Freq: Every day | ORAL | 6 refills | Status: DC

## 2016-05-26 NOTE — Progress Notes (Signed)
Oral Chemotherapy Pharmacist Encounter  Dispensed samples to patient:  Medication: Ibrance 100mg  capsules Instructions: Take 1 capsule (100mg  total) by mouth daily with breakfast. Take on days 1-21, every 28 days Quantity dispensed: 21 Days supply: 28 Manufacturer: Pfizer Lot: [id number] Exp: 06/2018  Johny Drilling, PharmD, BCPS, BCOP 05/26/2016 11:08 AM Oral Oncology Clinic [phone]

## 2016-05-26 NOTE — Progress Notes (Signed)
Jade Mooney  [phone] [phone]) [phone]   ID: Jade Mooney DOB: May 29, 1945  MR#: [id number]  BJS#:[id number]  PCP: Ann Held, MD GYN: Newton Pigg MD SU:  OTHER MD: Clarene Essex MD, Leslie Andrea M.D., Gatha Mayer MD  CHIEF COMPLAINT: Newly diagnosed metastatic breast cancer  CURRENT TREATMENT: Zolendronate, anastrozole, palbociclib  BREAST CANCER HISTORY: From the earlier summary notes:  Jade Mooney was referred to Dr. Clarene Essex for evaluation of abdominal discomfort and nausea. Exam and blood work including liver function tests was unremarkable aside from normochromic normocytic anemia. Cholecystitis was suspected and the patient underwent endoscopy followed by abdominal/ pelvic CT scan on 09/29/2013. This showed showed a normal gallbladder. Incidental findings included multiple simple cysts in the liver and aortic atherosclerosis. However mixed lytic and sclerotic bony lesions were noted. On 10/10/2013 the patient underwent biopsy of the right iliac bone, and this showed ([id number]) metastatic invasive ductal carcinoma, grade 2, estrogen and progesterone receptor strongly positive.  Jade Mooney has a remote history of breast cancer, which we tried to reconstruct. She was originally diagnosed early in 1997 with stage III disease, and underwent right mastectomy followed by adjuvant chemotherapy. She then participated in a [facility] protocol with high-dose chemotherapy followed by stem cell rescue 12/22/1995. Unfortunately later that year liver lesions were noted and were biopsy-proven to be metastatic breast cancer. This was not felt to be resectable. However the tumor was estrogen receptor positive and HER-2 positive. The patient was treated with Herceptin (she does not know for what period of time) and was then on tamoxifen until November of 2002. She also participated in a vaccine study at [facility].  Jade Mooney's subsequent history is as detailed below  INTERVAL  HISTORY: Jade Mooney returns today for follow-up of her estrogen receptor positive stage IV breast cancer accompanied by her husband. Since the last visit here she was started on palbociclib at 125 mg daily, 3 weeks on, one-week off. She tolerated this well, with no nausea, and only minimal fatigue. She was able to continue her activities of daily living as before. The only side effects she has noted, if they are side effects are that she is much more hungry and that she occasionally finds herself sighing. She denies pleurisy, shortness of breath, or cough.  She also continues on anastrozole, with good tolerance.  Hot flashes and vaginal dryness are not a major issue. She never developed the arthralgias or myalgias that many patients can experience on this medication. She obtains it at a good price.  She will be due for his alendronate in April.  REVIEW OF SYSTEMS: Jade Mooney is recovering well from her surgery. She is still not walking normally of course. Her knees are hurting likely because of changed gait. She is also developing a little bit of discomfort in the left posterior upper hip area. She tells me she has an appointment with Dr. Ricki Rodriguez tomorrow to evaluate this further. Her pain is generally well-controlled on Tylenol and when it gets a little bit worse she tried Ultram, but it made her itchy. She tells me the dialogue that she was on previously also made her itch. Aside from these issues a detailed review of systems today was stable.  PAST MEDICAL HISTORY: Past Medical History:  Diagnosis Date  . Anxiety   . Cancer [facility])    Breast 1997 right tx with mastectomy and chemo, metastatic now  . GERD (gastroesophageal reflux disease)   . Hypercholesterolemia   . Hypertension   . Hypothyroidism   .  Osteoarthritis    oa  . TIA (transient ischemic attack) last 11-08-15   x 2 total    PAST SURGICAL HISTORY: Past Surgical History:  Procedure Laterality Date  . CATARACT EXTRACTION, BILATERAL    .  MASTECTOMY MODIFIED RADICAL Right 1997  . porta cath insertion  1997   later removed  . radiation tx  finished 02-25-16   x 20 tx  . TONSILLECTOMY    . TOTAL HIP ARTHROPLASTY Left 03/12/2016   Procedure: LEFT TOTAL HIP ARTHROPLASTY ANTERIOR APPROACH;  Surgeon: Gaynelle Arabian, MD;  Location: [facility];  Service: Orthopedics;  Laterality: Left;  . TOTAL KNEE ARTHROPLASTY Left   . TUBAL LIGATION      FAMILY HISTORY No family history on file. The patient's father died at the age of 52 from heart disease. The patient's mother died at the age of 70. The patient had no brothers, one sister. One of the patient's mother's 5 sisters was diagnosed with breast cancer in her 49s  GYNECOLOGIC HISTORY:  No LMP recorded. Patient is postmenopausal. Menarche age 60, the patient is GX P0. She stopped having periods approximately 1994. She used hormone replacement until 1997. 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 Her husband Jade Mooney (goes by "Jade Mooney" was [employer]. of information all services at [employer]. They live alone, with no pets.    ADVANCED DIRECTIVES: In place   HEALTH MAINTENANCE: Social History  Substance Use Topics  . Smoking status: Never Smoker  . Smokeless tobacco: Never Used  . Alcohol use Yes     Comment: 1 glass wine 4-5 x week     Colonoscopy: 2013  PAP: 2010  Bone density: 2012  Lipid panel:  Allergies  Allergen Reactions  . Amoxicillin Hives    Has patient had a PCN reaction causing immediate rash, facial/tongue/throat swelling, SOB or lightheadedness with hypotension:unsure Has patient had a PCN reaction causing severe rash involving mucus membranes or skin necrosis:No Has patient had a PCN reaction that required hospitalization:No Has patient had a PCN reaction occurring within the last 10 years: Yes If all of the above answers are "NO", then may proceed with  Cephalosporin use.   Marland Kitchen Percocet [Oxycodone-Acetaminophen] Nausea And Vomiting  . Percodan [Oxycodone-Aspirin] Nausea And Vomiting    Current Outpatient Prescriptions  Medication Sig Dispense Refill  . rOPINIRole (REQUIP) 0.5 MG tablet Take 0.5 mg by mouth 3 (three) times daily.    Marland Kitchen acetaminophen (TYLENOL) 500 MG tablet Take 1,000 mg by mouth every 6 (six) hours as needed for mild pain. Reported on 04/17/2015    . anastrozole (ARIMIDEX) 1 MG tablet Take 1 tablet (1 mg total) by mouth daily. 90 tablet 3  . fluticasone (FLONASE) 50 MCG/ACT nasal spray Place 1 spray into both nostrils 2 (two) times daily.     Marland Kitchen levothyroxine (SYNTHROID, LEVOTHROID) 88 MCG tablet Take 88 mcg by mouth daily before breakfast.    . losartan (COZAAR) 25 MG tablet Take 25 mg by mouth daily.    . Lutein 6 MG TABS Take 6 mg by mouth 2 (two) times daily.     . magnesium gluconate (MAGONATE) 500 MG tablet Take 500 mg by mouth at bedtime.    . meclizine (ANTIVERT) 25 MG tablet Take 25 mg by mouth 3 (three) times daily as needed for dizziness.    Marland Kitchen METRONIDAZOLE, TOPICAL, 0.75 % LOTN Apply 1 application topically  at bedtime. Apply to face for rosacea    . palbociclib (IBRANCE) 125 MG capsule Take 1 capsule (125 mg total) by mouth daily with breakfast. Take whole with food. 21 capsule 6  . pantoprazole (PROTONIX) 40 MG tablet Take 40 mg by mouth 2 (two) times daily. Before breakfast and before supper    . Polyethyl Glycol-Propyl Glycol (SYSTANE ULTRA) 0.4-0.3 % SOLN Place 1-2 drops into both eyes 2 (two) times daily.    . polyethylene glycol (MIRALAX / GLYCOLAX) packet Take 4 g by mouth daily. 1 teaspoonful in the morning    . simvastatin (ZOCOR) 40 MG tablet Take 40 mg by mouth at bedtime.     . traMADol (ULTRAM) 50 MG tablet Take 1 tablet (50 mg total) by mouth every 6 (six) hours as needed. 60 tablet 3  . traZODone (DESYREL) 50 MG tablet Take 25 mg by mouth at bedtime.      No current facility-administered medications  for this visit.     OBJECTIVE: Middle-aged white woman  Vitals:   05/26/16 0951  BP: (!) 135/59  Pulse: 66  Resp: 18  Temp: 97.7 F (36.5 C)     Body mass index is 24.79 kg/m.    ECOG FS:1 - Symptomatic but completely ambulatory  Sclerae unicteric, pupils round and equal Oropharynx clear and moist-- no thrush or other lesions No cervical or supraclavicular adenopathy Lungs no rales or rhonchi Heart regular rate and rhythm Abd soft, nontender, positive bowel sounds MSK no focal spinal tenderness, Status post left hip replacement with good range of motion Neuro: nonfocal, well oriented, appropriate affect Breasts: Status post right mastectomy. In the scar itself towards the medial aspect there is a 1 mm area of induration which she tells me is new area this will bear watching. The right axilla is benign. The left breast is unremarkable.   LAB RESULTS: CMP     Component Value Date/Time   NA 140 05/26/2016 0932   K 3.8 05/26/2016 0932   CL 107 03/14/2016 0412   CO2 27 05/26/2016 0932   GLUCOSE 91 05/26/2016 0932   BUN 13.2 05/26/2016 0932   CREATININE 0.8 05/26/2016 0932   CALCIUM 9.3 05/26/2016 0932   PROT 6.8 05/26/2016 0932   ALBUMIN 4.4 05/26/2016 0932   AST 16 05/26/2016 0932   ALT 13 05/26/2016 0932   ALKPHOS 72 05/26/2016 0932   BILITOT 0.34 05/26/2016 0932   GFRNONAA >60 03/14/2016 0412   GFRAA >60 03/14/2016 0412    I No results found for: SPEP  Lab Results  Component Value Date   WBC 2.1 (L) 05/26/2016   NEUTROABS 1.1 (L) 05/26/2016   HGB 12.7 05/26/2016   HCT 39.6 05/26/2016   MCV 85.9 05/26/2016   PLT 190 05/26/2016      Chemistry      Component Value Date/Time   NA 140 05/26/2016 0932   K 3.8 05/26/2016 0932   CL 107 03/14/2016 0412   CO2 27 05/26/2016 0932   BUN 13.2 05/26/2016 0932   CREATININE 0.8 05/26/2016 0932      Component Value Date/Time   CALCIUM 9.3 05/26/2016 0932   ALKPHOS 72 05/26/2016 0932   AST 16 05/26/2016 0932   ALT  13 05/26/2016 0932   BILITOT 0.34 05/26/2016 0932      CA 27.29 0.0 - 38.6 U/mL 34.2 34.9CM        No results for input(s): INR in the last 168 hours.  Urinalysis    Component Value  Date/Time   COLORURINE STRAW (A) 03/05/2016 1350    STUDIES: No results found.   ASSESSMENT: 71 y.o. [place] woman with stage IV right breast cancer (bone metastatic disease, subcutaneous nodules)  (1) s/p Right mastectomy 1997 for an estrogen receptor and HER-2 positive breast cancer, treated with  (a) adjuvant chemotherapy according to CALGB 9640 (taxotere x 4 cycles)  (b) high-dose chemotherapy (cyclophosphamide, carboplatin, BCNU) followed by stem cell rescue at [facility] 12/22/1995  (c) biopsy-proven metastatic disease November 2007, estrogen receptor and HER-2 positive  (d) trastuzumab (dose? Duration?)  (e) tamoxifen for 5 years completed 2002  (f) participation in a vaccine study at [facility] 2003 (CEA primed dendritic cells, HER-2 primed dendritic cells)    (2) status post right iliac crest biopsy 10/10/2013 for invasive ductal carcinoma, grade 2, estrogen and progesterone receptor positive  (3) zolendronate started 10/12/2013,  repeated every 12 weeks, next dose due late April 2018  (a) dexa scan 07/28/2013 normal  (4) biopsy of a scalp lesion 10/17/2013 showed metastatic breast cancer, HER-2/neu negative  (a) biopsy of the right anterior chest wall nodule August 2017 shows adenocarcinoma  (5) anastrozole started 10/28/2013  (a) measurable disease = subcutaneous nodules in scalp and LLQ abdomen  (b)  CA-27-29 is informative  (c)  Repeat PET scan 12/28/2014 shows continuing response ; exam shows resolution of scalp lesions  (d) new skin lesion removed from right anterior chest wall August 2017  (e) status post radiation to the anterior lower right chest wall completed 02/25/2017 (20 sessions)   (f) palbociclib added 04/21/2016 at 125 mg 21/7  (g) palbociclib dose dropped 200 mg 21/7 starting  with the March 2018 cycle  (6) neoplasia associated pain  (a) painful blastic lesion left femoral intertrochanteric region: Status post radiation completed November 2015  (b) pain left knee, status post TKR remotely  (c) left total hip replacement 03/12/2016  PLAN: I spent approximately 30 minutes with Jade Mooney with most of that time spent discussing her complex problems.   She is having some knee pain now which is likely due to the fact that she is not walking entirely normally because she is so close to the date of her recent surgery. She will be seeing Dr.Aluisio regarding this.    as far as her metastatic breast cancer is concerned Jade Mooney is tolerating the palbociclib remarkably well. However it is causing her a little bit more neutropenia then we would like. Accordingly we are going to drop the dose 200 mg daily, 21 days on 7 days off, and since her neutrophil count today is only 1.1, we are going to wait 1 week to start her next cycle.  Day 1 cycle 2 will be 06/02/2016.  Her yearly [facility] cost is calculated at [phone]. She received samples for her second cycle today  She will have her mid cycle counts 2 weeks from that date and she will return to see me 06/30/2016. She will be due for repeat zolendronate on that day.  Recall she has a week long trip to [place] planned beginning 07/05/2016. We will decide when she sees me whether she should start her Ibrance then or weight a week and until she gets back from that trip.  Once she has been on Ibrance 3-4 cycles, she will be restaged.  At the next visit we will make sure to reevaluate this small bump on her right chest wall scar.  She tried tramadol for pain but got itchy. I suggested she try Aleve one or 2 tablets  up to 3 times daily with food.  Otherwise she knows to call for any problems that may develop before her next visit here.    Chauncey Cruel, MD   05/26/2016 10:43 AM

## 2016-05-26 NOTE — Progress Notes (Signed)
Updating Ibrance prescription to 100mg  strength. Johny Drilling, PharmD, [place], BCOP Pharmacy: [phone] Oral Chemo Clinic: [phone] 05/26/2016 11:24 AM

## 2016-05-27 DIAGNOSIS — Z471 Aftercare following joint replacement surgery: Secondary | ICD-10-CM | POA: Diagnosis not present

## 2016-05-27 DIAGNOSIS — Z96642 Presence of left artificial hip joint: Secondary | ICD-10-CM | POA: Diagnosis not present

## 2016-05-27 LAB — CANCER ANTIGEN 27.29: CA 27.29: 78.4 U/mL — ABNORMAL HIGH (ref 0.0–38.6)

## 2016-06-04 DIAGNOSIS — E538 Deficiency of other specified B group vitamins: Secondary | ICD-10-CM | POA: Diagnosis not present

## 2016-06-12 DIAGNOSIS — M1711 Unilateral primary osteoarthritis, right knee: Secondary | ICD-10-CM | POA: Diagnosis not present

## 2016-06-16 DIAGNOSIS — Z79899 Other long term (current) drug therapy: Secondary | ICD-10-CM | POA: Diagnosis not present

## 2016-06-16 DIAGNOSIS — D519 Vitamin B12 deficiency anemia, unspecified: Secondary | ICD-10-CM | POA: Diagnosis not present

## 2016-06-16 DIAGNOSIS — E039 Hypothyroidism, unspecified: Secondary | ICD-10-CM | POA: Diagnosis not present

## 2016-06-16 DIAGNOSIS — E782 Mixed hyperlipidemia: Secondary | ICD-10-CM | POA: Diagnosis not present

## 2016-06-19 NOTE — Telephone Encounter (Signed)
Oral Chemotherapy Pharmacist Encounter  Copy of yearly expenses faxed to [employer] at [phone]. Patient has been updated.  This encounter will continue to be updated until final determination.  Oral Oncology Clinic will continue to follow.   Johny Drilling, PharmD, BCPS, BCOP 06/19/2016  12:49 PM Oral Oncology Clinic [phone]

## 2016-06-23 DIAGNOSIS — L719 Rosacea, unspecified: Secondary | ICD-10-CM | POA: Diagnosis not present

## 2016-06-23 DIAGNOSIS — C50919 Malignant neoplasm of unspecified site of unspecified female breast: Secondary | ICD-10-CM | POA: Diagnosis not present

## 2016-06-23 DIAGNOSIS — Z9181 History of falling: Secondary | ICD-10-CM | POA: Diagnosis not present

## 2016-06-23 DIAGNOSIS — Z1389 Encounter for screening for other disorder: Secondary | ICD-10-CM | POA: Diagnosis not present

## 2016-06-30 ENCOUNTER — Ambulatory Visit (HOSPITAL_BASED_OUTPATIENT_CLINIC_OR_DEPARTMENT_OTHER): Payer: Medicare Other

## 2016-06-30 ENCOUNTER — Telehealth: Payer: Self-pay | Admitting: Oncology

## 2016-06-30 ENCOUNTER — Ambulatory Visit (HOSPITAL_BASED_OUTPATIENT_CLINIC_OR_DEPARTMENT_OTHER): Payer: Medicare Other | Admitting: Oncology

## 2016-06-30 ENCOUNTER — Other Ambulatory Visit (HOSPITAL_BASED_OUTPATIENT_CLINIC_OR_DEPARTMENT_OTHER): Payer: Medicare Other

## 2016-06-30 VITALS — BP 158/86 | HR 59 | Temp 98.1°F | Resp 18 | Ht 63.5 in | Wt 145.5 lb

## 2016-06-30 DIAGNOSIS — C50911 Malignant neoplasm of unspecified site of right female breast: Secondary | ICD-10-CM

## 2016-06-30 DIAGNOSIS — C50811 Malignant neoplasm of overlapping sites of right female breast: Secondary | ICD-10-CM

## 2016-06-30 DIAGNOSIS — C7989 Secondary malignant neoplasm of other specified sites: Secondary | ICD-10-CM | POA: Diagnosis not present

## 2016-06-30 DIAGNOSIS — Z170 Estrogen receptor positive status [ER+]: Principal | ICD-10-CM

## 2016-06-30 DIAGNOSIS — C44501 Unspecified malignant neoplasm of skin of breast: Secondary | ICD-10-CM

## 2016-06-30 DIAGNOSIS — C50119 Malignant neoplasm of central portion of unspecified female breast: Secondary | ICD-10-CM

## 2016-06-30 DIAGNOSIS — C7951 Secondary malignant neoplasm of bone: Secondary | ICD-10-CM

## 2016-06-30 DIAGNOSIS — M545 Low back pain: Secondary | ICD-10-CM | POA: Diagnosis not present

## 2016-06-30 DIAGNOSIS — C50912 Malignant neoplasm of unspecified site of left female breast: Secondary | ICD-10-CM | POA: Diagnosis present

## 2016-06-30 LAB — CBC WITH DIFFERENTIAL/PLATELET
BASO%: 2.2 % — ABNORMAL HIGH (ref 0.0–2.0)
Basophils Absolute: 0.1 10*3/uL (ref 0.0–0.1)
EOS%: 0.7 % (ref 0.0–7.0)
Eosinophils Absolute: 0 10*3/uL (ref 0.0–0.5)
HCT: 37.3 % (ref 34.8–46.6)
HGB: 12.3 g/dL (ref 11.6–15.9)
LYMPH%: 23.7 % (ref 14.0–49.7)
MCH: 28.5 pg (ref 25.1–34.0)
MCHC: 33 g/dL (ref 31.5–36.0)
MCV: 86.3 fL (ref 79.5–101.0)
MONO#: 0.4 10*3/uL (ref 0.1–0.9)
MONO%: 15.6 % — ABNORMAL HIGH (ref 0.0–14.0)
NEUT#: 1.6 10*3/uL (ref 1.5–6.5)
NEUT%: 57.8 % (ref 38.4–76.8)
Platelets: 190 10*3/uL (ref 145–400)
RBC: 4.32 10*6/uL (ref 3.70–5.45)
RDW: 19.6 % — ABNORMAL HIGH (ref 11.2–14.5)
WBC: 2.7 10*3/uL — ABNORMAL LOW (ref 3.9–10.3)
lymph#: 0.6 10*3/uL — ABNORMAL LOW (ref 0.9–3.3)

## 2016-06-30 LAB — COMPREHENSIVE METABOLIC PANEL
ALT: 11 U/L (ref 0–55)
AST: 19 U/L (ref 5–34)
Albumin: 4.4 g/dL (ref 3.5–5.0)
Alkaline Phosphatase: 64 U/L (ref 40–150)
Anion Gap: 12 mEq/L — ABNORMAL HIGH (ref 3–11)
BUN: 13.3 mg/dL (ref 7.0–26.0)
CO2: 25 mEq/L (ref 22–29)
Calcium: 9.3 mg/dL (ref 8.4–10.4)
Chloride: 101 mEq/L (ref 98–109)
Creatinine: 0.8 mg/dL (ref 0.6–1.1)
EGFR: 77 mL/min/{1.73_m2} — ABNORMAL LOW (ref >90)
Glucose: 87 mg/dl (ref 70–140)
Potassium: 4.1 mEq/L (ref 3.5–5.1)
Sodium: 138 mEq/L (ref 136–145)
Total Bilirubin: 0.47 mg/dL (ref 0.20–1.20)
Total Protein: 6.5 g/dL (ref 6.4–8.3)

## 2016-06-30 MED ORDER — ZOLEDRONIC ACID 4 MG/100ML IV SOLN
4.0000 mg | Freq: Once | INTRAVENOUS | Status: AC
  Administered 2016-06-30: 4 mg via INTRAVENOUS
  Filled 2016-06-30: qty 100

## 2016-06-30 MED ORDER — LETROZOLE 2.5 MG PO TABS: 2.5000 mg | ORAL_TABLET | Freq: Every day | ORAL | 4 refills | Status: DC

## 2016-06-30 NOTE — Progress Notes (Signed)
[place]  Telephone:(336) [phone] Fax:(336) [phone]   ID: Jade Mooney DOB: [birth year]  MR#: [id number]  UUV#:[id number]  PCP: Ann Held, MD GYN: Newton Pigg MD SU:  OTHER MD: Clarene Essex MD, Leslie Andrea M.D., Gatha Mayer MD  CHIEF COMPLAINT: Newly diagnosed metastatic breast cancer  CURRENT TREATMENT: Zolendronate, anastrozole, palbociclib  INTERVAL HISTORY: Jade Mooney returns today for follow-up of her metastatic estrogen receptor positive breast cancer, accompanied by her husband Jade Mooney. She continues on anastrozole, with good tolerance.  Hot flashes and vaginal dryness are not a major issue. She never developed the arthralgias or myalgias that many patients can experience on this medication. She obtains it at a good price.  She is also on palbociclib. We dropped her dose to 100 mg with a past cycle because of low counts. She is minimally fatigued if at all, has no nausea from the medication, and has somewhat loose bowel movements but most likely because she takes a teaspoon of MiraLAX daily. She is obtaining the drug with the help of our oral chemotherapy pharmacist.  She also receives zolendronate every 12 weeks. A dose is due today.  REVIEW OF SYSTEMS: Jamirra is doing "fine" she "feels good". She has more energy. They're going on a cruise this weekend and she is looking forward to that. She does have some back pain at times and she takes occasional Naprosyn for this. Tramadol caused itching. She benefited greatly from her surgery but then developed a Baker's cyst on the right. That thankfully has resolved. A detailed review of systems today was otherwise stable   BREAST CANCER HISTORY: From the earlier summary notes:  Jade Mooney was referred to Dr. Clarene Essex for evaluation of abdominal discomfort and nausea. Exam and blood work including liver function tests was unremarkable aside from normochromic normocytic anemia. Cholecystitis was suspected and the  patient underwent endoscopy followed by abdominal/ pelvic CT scan on 09/29/2013. This showed showed a normal gallbladder. Incidental findings included multiple simple cysts in the liver and aortic atherosclerosis. However mixed lytic and sclerotic bony lesions were noted. On 10/10/2013 the patient underwent biopsy of the right iliac bone, and this showed ([id number]) metastatic invasive ductal carcinoma, grade 2, estrogen and progesterone receptor strongly positive.  Sheba has a remote history of breast cancer, which we tried to reconstruct. She was originally diagnosed early in 1997 with stage III disease, and underwent right mastectomy followed by adjuvant chemotherapy. She then participated in a [facility] protocol with high-dose chemotherapy followed by stem cell rescue 12/22/1995. Unfortunately later that year liver lesions were noted and were biopsy-proven to be metastatic breast cancer. This was not felt to be resectable. However the tumor was estrogen receptor positive and HER-2 positive. The patient was treated with Herceptin (she does not know for what period of time) and was then on tamoxifen until November of 2002. She also participated in a vaccine study at [facility].  Jade Mooney's subsequent history is as detailed below   PAST MEDICAL HISTORY: Past Medical History:  Diagnosis Date  . Anxiety   . Cancer [facility])    Breast 1997 right tx with mastectomy and chemo, metastatic now  . GERD (gastroesophageal reflux disease)   . Hypercholesterolemia   . Hypertension   . Hypothyroidism   . Osteoarthritis    oa  . TIA (transient ischemic attack) last 11-08-15   x 2 total    PAST SURGICAL HISTORY: Past Surgical History:  Procedure Laterality Date  . CATARACT EXTRACTION, BILATERAL    . MASTECTOMY  MODIFIED RADICAL Right 1997  . porta cath insertion  1997   later removed  . radiation tx  finished 02-25-16   x 20 tx  . TONSILLECTOMY    . TOTAL HIP ARTHROPLASTY Left 03/12/2016   Procedure: LEFT TOTAL  HIP ARTHROPLASTY ANTERIOR APPROACH;  Surgeon: Gaynelle Arabian, MD;  Location: [facility]S;  Service: Orthopedics;  Laterality: Left;  . TOTAL KNEE ARTHROPLASTY Left   . TUBAL LIGATION      FAMILY HISTORY No family history on file. The patient's father died at the age of 32 from heart disease. The patient's mother died at the age of 77. The patient had no brothers, one sister. One of the patient's mother's 5 sisters was diagnosed with breast cancer in her 30s  GYNECOLOGIC HISTORY:  No LMP recorded. Patient is postmenopausal. Menarche age 29, the patient is GX P0. She stopped having periods approximately 1994. She used hormone replacement until 1997. 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 Her husband Jade Mooney (goes by "Jade Mooney" was [employer]. of information all services at [employer]. They live alone, with no pets.    ADVANCED DIRECTIVES: In place   HEALTH MAINTENANCE: Social History  Substance Use Topics  . Smoking status: Never Smoker  . Smokeless tobacco: Never Used  . Alcohol use Yes     Comment: 1 glass wine 4-5 x week     Colonoscopy: 2013  PAP: 2010  Bone density: 2012  Lipid panel:  Allergies  Allergen Reactions  . Amoxicillin Hives    Has patient had a PCN reaction causing immediate rash, facial/tongue/throat swelling, SOB or lightheadedness with hypotension:unsure Has patient had a PCN reaction causing severe rash involving mucus membranes or skin necrosis:No Has patient had a PCN reaction that required hospitalization:No Has patient had a PCN reaction occurring within the last 10 years: Yes If all of the above answers are "NO", then may proceed with Cephalosporin use.   Marland Kitchen Percocet [Oxycodone-Acetaminophen] Nausea And Vomiting  . Percodan [Oxycodone-Aspirin] Nausea And Vomiting  . Tramadol Itching    Current Outpatient Prescriptions  Medication Sig Dispense Refill  .  acetaminophen (TYLENOL) 500 MG tablet Take 1,000 mg by mouth every 6 (six) hours as needed for mild pain. Reported on 04/17/2015    . fluticasone (FLONASE) 50 MCG/ACT nasal spray Place 1 spray into both nostrils 2 (two) times daily.     Marland Kitchen letrozole (FEMARA) 2.5 MG tablet Take 1 tablet (2.5 mg total) by mouth daily. 90 tablet 4  . levothyroxine (SYNTHROID, LEVOTHROID) 88 MCG tablet Take 88 mcg by mouth daily before breakfast.    . losartan (COZAAR) 25 MG tablet Take 25 mg by mouth daily.    . Lutein 6 MG TABS Take 6 mg by mouth 2 (two) times daily.     . magnesium gluconate (MAGONATE) 500 MG tablet Take 500 mg by mouth at bedtime.    . meclizine (ANTIVERT) 25 MG tablet Take 25 mg by mouth 3 (three) times daily as needed for dizziness.    Marland Kitchen METRONIDAZOLE, TOPICAL, 0.75 % LOTN Apply 1 application topically at bedtime. Apply to face for rosacea    . palbociclib (IBRANCE) 100 MG capsule Take 1 capsule (100 mg total) by mouth daily with breakfast. Take whole with food on days 1-21, every 28 days. 21 capsule 6  . pantoprazole (PROTONIX) 40 MG tablet Take 40 mg by mouth 2 (two) times  daily. Before breakfast and before supper    . Polyethyl Glycol-Propyl Glycol (SYSTANE ULTRA) 0.4-0.3 % SOLN Place 1-2 drops into both eyes 2 (two) times daily.    . polyethylene glycol (MIRALAX / GLYCOLAX) packet Take 4 g by mouth daily. 1 teaspoonful in the morning    . rOPINIRole (REQUIP) 0.5 MG tablet Take 0.5 mg by mouth 3 (three) times daily.    . simvastatin (ZOCOR) 40 MG tablet Take 40 mg by mouth at bedtime.     . traZODone (DESYREL) 50 MG tablet Take 25 mg by mouth at bedtime.      No current facility-administered medications for this visit.     OBJECTIVE: Middle-aged white woman Who appears stated age 6:   06/30/16 1226  BP: (!) 158/86  Pulse: (!) 59  Resp: 18  Temp: 98.1 F (36.7 C)     Body mass index is 25.37 kg/m.    ECOG FS:1 - Symptomatic but completely ambulatory  Sclerae unicteric, EOMs  intact Oropharynx clear and moist No cervical or supraclavicular adenopathy Lungs no rales or rhonchi Heart regular rate and rhythm Abd soft, nontender, positive bowel sounds MSK no focal spinal tenderness, no upper extremity lymphedema Neuro: nonfocal, well oriented, appropriate affect Breasts: The right breast is status post mastectomy. Below the mastectomy scar there are 2 very small lesions, which are not pigmented and nontender, one measuring approximately a third of a centimeter, the other one the fourth of a centimeter, both suggestive of local recurrence of disease. The right axilla is benign. The left breast is unremarkable  LAB RESULTS: CMP     Component Value Date/Time   NA 138 06/30/2016 1212   K 4.1 06/30/2016 1212   CL 107 03/14/2016 0412   CO2 25 06/30/2016 1212   GLUCOSE 87 06/30/2016 1212   BUN 13.3 06/30/2016 1212   CREATININE 0.8 06/30/2016 1212   CALCIUM 9.3 06/30/2016 1212   PROT 6.5 06/30/2016 1212   ALBUMIN 4.4 06/30/2016 1212   AST 19 06/30/2016 1212   ALT 11 06/30/2016 1212   ALKPHOS 64 06/30/2016 1212   BILITOT 0.47 06/30/2016 1212   GFRNONAA >60 03/14/2016 0412   GFRAA >60 03/14/2016 0412    I No results found for: SPEP  Lab Results  Component Value Date   WBC 2.7 (L) 06/30/2016   NEUTROABS 1.6 06/30/2016   HGB 12.3 06/30/2016   HCT 37.3 06/30/2016   MCV 86.3 06/30/2016   PLT 190 06/30/2016      Chemistry      Component Value Date/Time   NA 138 06/30/2016 1212   K 4.1 06/30/2016 1212   CL 107 03/14/2016 0412   CO2 25 06/30/2016 1212   BUN 13.3 06/30/2016 1212   CREATININE 0.8 06/30/2016 1212      Component Value Date/Time   CALCIUM 9.3 06/30/2016 1212   ALKPHOS 64 06/30/2016 1212   AST 19 06/30/2016 1212   ALT 11 06/30/2016 1212   BILITOT 0.47 06/30/2016 1212      CA 27.29 0.0 - 38.6 U/mL 34.2 34.9CM        No results for input(s): INR in the last 168 hours.  Urinalysis    Component Value Date/Time   COLORURINE STRAW  (A) 03/05/2016 1350    STUDIES: We reviewed her mammogram and PET scan results from December/January  ASSESSMENT: 71 y.o. [place] woman with stage IV right breast cancer (bone metastatic disease, subcutaneous nodules)  (1) s/p Right mastectomy 1997 for an estrogen receptor and HER-2  positive breast cancer, treated with  (a) adjuvant chemotherapy according to CALGB 9640 (taxotere x 4 cycles)  (b) high-dose chemotherapy (cyclophosphamide, carboplatin, BCNU) followed by stem cell rescue at [facility] 12/22/1995  (c) biopsy-proven metastatic disease November 2007, estrogen receptor and HER-2 positive  (d) trastuzumab (dose? Duration?)  (e) tamoxifen for 5 years completed 2002  (f) participation in a vaccine study at [facility] 2003 (CEA primed dendritic cells, HER-2 primed dendritic cells)   (2) status post right iliac crest biopsy 10/10/2013 for invasive ductal carcinoma, grade 2, estrogen and progesterone receptor positive  (3) zolendronate started 10/12/2013,  repeated every 12 weeks, next dose due April 2018  (a) dexa scan 07/28/2013 normal  (4) biopsy of a scalp lesion 10/17/2013 showed metastatic breast cancer, HER-2/neu negative  (a) biopsy of the right anterior chest wall nodule August 2017 shows adenocarcinoma  (5) anastrozole started 10/28/2013, changed to letrozole 06/27/2016  (a) measurable disease = subcutaneous nodules in scalp and LLQ abdomen  (b)  CA-27-29 is informative  (c)  Repeat PET scan 12/28/2014 shows continuing response ; exam shows resolution of scalp lesions  (d) new skin lesion removed from right anterior chest wall August 2017  (e) status post radiation to the anterior lower right chest wall completed 02/25/2017 (20 sessions)   (f) PET scan 04/04/2016 shows 2 new foci of metabolic activity (T aches and manubrium)  (f) palbociclib added 04/21/2016 at 125 mg 21/7  (g) palbociclib dose dropped to 100 mg 21/7 starting with the March 2018 cycle  (6) neoplasia associated  pain  (a) painful blastic lesion left femoral intertrochanteric region: Status post radiation completed November 2015  (b) pain left knee, status post TKR remotely  (c) left total hip replacement 03/12/2016  PLAN: I spent approximately 30 minutes with Jana Half with most of that time spent discussing her situation. On the plus side she is now 10-1/2 years out from biopsy-proven metastatic disease and clinically she is doing absolutely terrific.  On the negative side her CA-27-29 has been trending up and she has a very small area of what I think is going to prove to be chest wall recurrence in the right chest wall approximately 2 inches below the breast scar.  I'm sending her back to Dr. Donne Hazel so he can remove this area (which has already been irradiated) and we can check the prognostic panel to see the cancer in that area has become estrogen resistant  This will have to wait a couple of weeks because they have a cruise to [place] between April 7 and April 14.  Accordingly the plan is to continue the aromatase inhibitors (since she is about to run out of anastrozole I am switching her to letrozole at this point). She will receive a bottle of the palbociclib through our oral chemotherapy pharmacist today. She gets her labs checked through her primary care physician's office- monthly basis.  We will then restage her with a chest x-ray and bone scan July 6. She will return to see me on July 9. She will receive zolendronate today and July 9. We will have another being on the CA-27-29 also before that visit.  She has a very good understanding of this plan. She knows to call for any problems that may develop before her next visit.    Chauncey Cruel, MD   06/30/2016 1:14 PM

## 2016-06-30 NOTE — Patient Instructions (Signed)

## 2016-06-30 NOTE — Telephone Encounter (Signed)
Gave patient AVS and calender per 4/9 los. Central radiology to contact patient with bone scan and Dg Chest.

## 2016-07-01 DIAGNOSIS — H353131 Nonexudative age-related macular degeneration, bilateral, early dry stage: Secondary | ICD-10-CM | POA: Diagnosis not present

## 2016-07-01 LAB — CANCER ANTIGEN 27.29: CA 27.29: 70.6 U/mL — ABNORMAL HIGH (ref 0.0–38.6)

## 2016-07-03 DIAGNOSIS — D510 Vitamin B12 deficiency anemia due to intrinsic factor deficiency: Secondary | ICD-10-CM | POA: Diagnosis not present

## 2016-07-17 ENCOUNTER — Telehealth: Payer: Self-pay

## 2016-07-17 NOTE — Telephone Encounter (Signed)
Spoke to pt by phone regarding Ibrance schedule and next needed lab work.  According to Dr magrinat's last office note, pt to have monthly CBCs.  Pt finishes this cycle of Ibrance on 5/13 and is to start new cycle on 5/21.  She will be out of town from 5/12 -5/20.  Pt was instructed to have CBC drawn the morning of 5/21, have those results faxed to [place] stat and wait for a call from the RN before starting next cycle of Ibrance.  Pt verbalizes understanding.

## 2016-07-29 ENCOUNTER — Encounter: Payer: Self-pay | Admitting: Pharmacist

## 2016-07-29 ENCOUNTER — Other Ambulatory Visit: Payer: Self-pay | Admitting: General Surgery

## 2016-07-29 DIAGNOSIS — R222 Localized swelling, mass and lump, trunk: Secondary | ICD-10-CM | POA: Diagnosis not present

## 2016-07-29 DIAGNOSIS — L929 Granulomatous disorder of the skin and subcutaneous tissue, unspecified: Secondary | ICD-10-CM | POA: Diagnosis not present

## 2016-07-29 DIAGNOSIS — L723 Sebaceous cyst: Secondary | ICD-10-CM | POA: Diagnosis not present

## 2016-07-29 NOTE — Progress Notes (Signed)
Oral Chemotherapy Pharmacist Encounter  Dispensed samples to patient:  Medication: Ibrance 100mg  capsules Instructions: Take 1 capsule by mouth once daily with breakfast. Take for 21 days on, 7 days off, repeat every 28 days Quantity dispensed: 42 Days supply: 56 Manufacturer: Pfizer Lot: [id number] Exp: 06/2018  Samples located in [employer]. Patient will be coming by [facility] today after 1pm to pick them up.  Johny Drilling, PharmD, BCPS, BCOP 07/29/2016 10:06 AM Oral Oncology Clinic [phone]

## 2016-07-31 ENCOUNTER — Other Ambulatory Visit: Payer: Self-pay | Admitting: Oncology

## 2016-08-01 DIAGNOSIS — D510 Vitamin B12 deficiency anemia due to intrinsic factor deficiency: Secondary | ICD-10-CM | POA: Diagnosis not present

## 2016-08-11 DIAGNOSIS — C50911 Malignant neoplasm of unspecified site of right female breast: Secondary | ICD-10-CM | POA: Diagnosis not present

## 2016-09-04 DIAGNOSIS — D510 Vitamin B12 deficiency anemia due to intrinsic factor deficiency: Secondary | ICD-10-CM | POA: Diagnosis not present

## 2016-09-08 DIAGNOSIS — D510 Vitamin B12 deficiency anemia due to intrinsic factor deficiency: Secondary | ICD-10-CM | POA: Diagnosis not present

## 2016-09-10 ENCOUNTER — Telehealth: Payer: Self-pay | Admitting: *Deleted

## 2016-09-10 NOTE — Telephone Encounter (Signed)
Received labs from [place] - with ANC 1.3 . Per MD review recommended for pt to proceed with next cycle of Ibrance.  Pt contacted and informed.

## 2016-09-26 ENCOUNTER — Encounter (HOSPITAL_COMMUNITY)
Admission: RE | Admit: 2016-09-26 | Discharge: 2016-09-26 | Disposition: A | Discharge status: Home / Self Care | Payer: Medicare Other | Source: Ambulatory Visit | Attending: Oncology | Admitting: Oncology

## 2016-09-26 ENCOUNTER — Other Ambulatory Visit (HOSPITAL_BASED_OUTPATIENT_CLINIC_OR_DEPARTMENT_OTHER): Payer: Medicare Other

## 2016-09-26 DIAGNOSIS — C50811 Malignant neoplasm of overlapping sites of right female breast: Secondary | ICD-10-CM

## 2016-09-26 DIAGNOSIS — Z170 Estrogen receptor positive status [ER+]: Principal | ICD-10-CM

## 2016-09-26 DIAGNOSIS — C7989 Secondary malignant neoplasm of other specified sites: Secondary | ICD-10-CM | POA: Diagnosis not present

## 2016-09-26 DIAGNOSIS — C7951 Secondary malignant neoplasm of bone: Secondary | ICD-10-CM

## 2016-09-26 DIAGNOSIS — C50911 Malignant neoplasm of unspecified site of right female breast: Secondary | ICD-10-CM

## 2016-09-26 DIAGNOSIS — C50919 Malignant neoplasm of unspecified site of unspecified female breast: Secondary | ICD-10-CM | POA: Diagnosis not present

## 2016-09-26 DIAGNOSIS — C44501 Unspecified malignant neoplasm of skin of breast: Secondary | ICD-10-CM

## 2016-09-26 LAB — CBC WITH DIFFERENTIAL/PLATELET
BASO%: 2.8 % — ABNORMAL HIGH (ref 0.0–2.0)
Basophils Absolute: 0.1 10*3/uL (ref 0.0–0.1)
EOS%: 2.3 % (ref 0.0–7.0)
Eosinophils Absolute: 0.1 10*3/uL (ref 0.0–0.5)
HCT: 38.4 % (ref 34.8–46.6)
HGB: 13.1 g/dL (ref 11.6–15.9)
LYMPH%: 26.6 % (ref 14.0–49.7)
MCH: 31.8 pg (ref 25.1–34.0)
MCHC: 34.1 g/dL (ref 31.5–36.0)
MCV: 93.2 fL (ref 79.5–101.0)
MONO#: 0.3 10*3/uL (ref 0.1–0.9)
MONO%: 11.6 % (ref 0.0–14.0)
NEUT#: 1.2 10*3/uL — ABNORMAL LOW (ref 1.5–6.5)
NEUT%: 56.7 % (ref 38.4–76.8)
Platelets: 205 10*3/uL (ref 145–400)
RBC: 4.12 10*6/uL (ref 3.70–5.45)
RDW: 16.9 % — ABNORMAL HIGH (ref 11.2–14.5)
WBC: 2.2 10*3/uL — ABNORMAL LOW (ref 3.9–10.3)
lymph#: 0.6 10*3/uL — ABNORMAL LOW (ref 0.9–3.3)

## 2016-09-26 LAB — COMPREHENSIVE METABOLIC PANEL
ALT: 13 U/L (ref 0–55)
AST: 20 U/L (ref 5–34)
Albumin: 4.4 g/dL (ref 3.5–5.0)
Alkaline Phosphatase: 60 U/L (ref 40–150)
Anion Gap: 9 mEq/L (ref 3–11)
BUN: 18.8 mg/dL (ref 7.0–26.0)
CO2: 26 mEq/L (ref 22–29)
Calcium: 9.4 mg/dL (ref 8.4–10.4)
Chloride: 100 mEq/L (ref 98–109)
Creatinine: 0.9 mg/dL (ref 0.6–1.1)
EGFR: 66 mL/min/{1.73_m2} — ABNORMAL LOW (ref >90)
Glucose: 86 mg/dl (ref 70–140)
Potassium: 4.3 mEq/L (ref 3.5–5.1)
Sodium: 135 mEq/L — ABNORMAL LOW (ref 136–145)
Total Bilirubin: 0.59 mg/dL (ref 0.20–1.20)
Total Protein: 6.7 g/dL (ref 6.4–8.3)

## 2016-09-26 MED ORDER — TECHNETIUM TC 99M MEDRONATE IV KIT
21.7000 | PACK | Freq: Once | INTRAVENOUS | Status: AC | PRN
  Administered 2016-09-26: 21.7 via INTRAVENOUS

## 2016-09-29 ENCOUNTER — Encounter: Payer: Self-pay | Admitting: Pharmacist

## 2016-09-29 ENCOUNTER — Ambulatory Visit (HOSPITAL_BASED_OUTPATIENT_CLINIC_OR_DEPARTMENT_OTHER): Payer: Medicare Other | Admitting: Oncology

## 2016-09-29 ENCOUNTER — Ambulatory Visit (HOSPITAL_BASED_OUTPATIENT_CLINIC_OR_DEPARTMENT_OTHER): Payer: Medicare Other

## 2016-09-29 VITALS — BP 152/77 | HR 63 | Temp 97.6°F | Resp 17 | Ht 63.5 in | Wt 155.4 lb

## 2016-09-29 DIAGNOSIS — C50911 Malignant neoplasm of unspecified site of right female breast: Secondary | ICD-10-CM

## 2016-09-29 DIAGNOSIS — Z170 Estrogen receptor positive status [ER+]: Secondary | ICD-10-CM

## 2016-09-29 DIAGNOSIS — C7951 Secondary malignant neoplasm of bone: Secondary | ICD-10-CM

## 2016-09-29 DIAGNOSIS — C7989 Secondary malignant neoplasm of other specified sites: Secondary | ICD-10-CM

## 2016-09-29 DIAGNOSIS — C44501 Unspecified malignant neoplasm of skin of breast: Secondary | ICD-10-CM

## 2016-09-29 DIAGNOSIS — C50919 Malignant neoplasm of unspecified site of unspecified female breast: Secondary | ICD-10-CM

## 2016-09-29 DIAGNOSIS — C50912 Malignant neoplasm of unspecified site of left female breast: Secondary | ICD-10-CM

## 2016-09-29 DIAGNOSIS — C50811 Malignant neoplasm of overlapping sites of right female breast: Secondary | ICD-10-CM

## 2016-09-29 MED ORDER — ZOLEDRONIC ACID 4 MG/100ML IV SOLN
4.0000 mg | Freq: Once | INTRAVENOUS | Status: AC
  Administered 2016-09-29: 4 mg via INTRAVENOUS
  Filled 2016-09-29: qty 100

## 2016-09-29 NOTE — Progress Notes (Signed)
[place]  [phone] Fax:(336) [phone]   ID: Jade Mooney DOB: 1945-03-27  MR#: [id number]  TOI#:[id number]  PCP: Jade Peer, MD GYN: Jade Pigg MD SU:  OTHER MD: Jade Essex MD, Jade Andrea M.D., Jade Mayer MD  CHIEF COMPLAINT: Newly diagnosed metastatic breast cancer  CURRENT TREATMENT: Zolendronate, letrozole, palbociclib  INTERVAL HISTORY: Jade Mooney returns today for follow-up of her estrogen receptor positive metastatic breast cancer accompanied by her husband.  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 This is stable as compared to the bone scan from April 2017 and the PET scan from January 2018.  I note also that her most recent CA-27-29, from April, was dropping. All this is very favorable.  She also receives zolendronate every 12 weeks. A dose is due today.  REVIEW OF SYSTEMS: Jade Mooney is feeling remarkably well overall and is now off narcotics. She had needed those particularly because of her hip problems and that greatly improved after her hip replacement. She has gained 15 pounds and looks very healthy with a weight on that is concerned about the weight gain. Very likely her appetite had been suppressed by the narcotics. She is now craving sweets. She exercises by walking but a 20 minute walk is difficult for her still. She has noted no other skin lesions or scalp lesions. A detailed review of systems today was stable  BREAST CANCER HISTORY: From the earlier summary notes:  Jade Mooney was referred to Dr. Clarene Mooney for evaluation of abdominal discomfort and nausea. Exam and blood work including liver function tests was unremarkable aside from normochromic normocytic anemia. Cholecystitis was suspected and the patient underwent endoscopy followed by abdominal/ pelvic CT scan on  09/29/2013. This showed showed a normal gallbladder. Incidental findings included multiple simple cysts in the liver and aortic atherosclerosis. However mixed lytic and sclerotic bony lesions were noted. On 10/10/2013 the patient underwent biopsy of the right iliac bone, and this showed ([id number]) metastatic invasive ductal carcinoma, grade 2, estrogen and progesterone receptor strongly positive.  Jade Mooney has a remote history of breast cancer, which we tried to reconstruct. She was originally diagnosed early in 1997 with stage III disease, and underwent right mastectomy followed by adjuvant chemotherapy. She then participated in a [facility] protocol with high-dose chemotherapy followed by stem cell rescue 12/22/1995. Unfortunately later that year liver lesions were noted and were biopsy-proven to be metastatic breast cancer. This was not felt to be resectable. However the tumor was estrogen receptor positive and HER-2 positive. The patient was treated with Herceptin (she does not know for what period of time) and was then on tamoxifen until November of 2002. She also participated in a vaccine study at [facility].  Jade Mooney's subsequent history is as detailed below   PAST MEDICAL HISTORY: Past Medical History:  Diagnosis Date  . Anxiety   . Cancer [facility])    Breast 1997 right tx with mastectomy and chemo, metastatic now  . GERD (gastroesophageal reflux disease)   . Hypercholesterolemia   . Hypertension   . Hypothyroidism   . Osteoarthritis    oa  . TIA (transient ischemic attack) last 11-08-15   x 2 total    PAST SURGICAL HISTORY: Past Surgical History:  Procedure Laterality Date  . CATARACT EXTRACTION, BILATERAL    . MASTECTOMY MODIFIED RADICAL Right 1997  .  porta cath insertion  1997   later removed  . radiation tx  finished 02-25-16   x 20 tx  . TONSILLECTOMY    . TOTAL HIP ARTHROPLASTY Left 03/12/2016   Procedure: LEFT TOTAL HIP ARTHROPLASTY ANTERIOR APPROACH;  Surgeon: Jade Arabian, MD;   Location: [facility];  Service: Orthopedics;  Laterality: Left;  . TOTAL KNEE ARTHROPLASTY Left   . TUBAL LIGATION      FAMILY HISTORY No family history on file. The patient's father died at the age of 61 from heart disease. The patient's mother died at the age of 4. The patient had no brothers, one sister. One of the patient's mother's 5 sisters was diagnosed with breast cancer in her 25s  GYNECOLOGIC HISTORY:  No LMP recorded. Patient is postmenopausal. Menarche age 85, the patient is GX P0. She stopped having periods approximately 1994. She used hormone replacement until 1997. 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 Her husband Jade Mooney (goes by "Jade Mooney" was [employer]. of information all services at [employer]. They live alone, with no pets.    ADVANCED DIRECTIVES: In place   HEALTH MAINTENANCE: Social History  Substance Use Topics  . Smoking status: Never Smoker  . Smokeless tobacco: Never Used  . Alcohol use Yes     Comment: 1 glass wine 4-5 x week     Colonoscopy: 2013  PAP: 2010  Bone density: 2012  Lipid panel:  Allergies  Allergen Reactions  . Amoxicillin Hives    Has patient had a PCN reaction causing immediate rash, facial/tongue/throat swelling, SOB or lightheadedness with hypotension:unsure Has patient had a PCN reaction causing severe rash involving mucus membranes or skin necrosis:No Has patient had a PCN reaction that required hospitalization:No Has patient had a PCN reaction occurring within the last 10 years: Yes If all of the above answers are "NO", then may proceed with Cephalosporin use.   Marland Kitchen Percocet [Oxycodone-Acetaminophen] Nausea And Vomiting  . Percodan [Oxycodone-Aspirin] Nausea And Vomiting  . Tramadol Itching    Current Outpatient Prescriptions  Medication Sig Dispense Refill  . acetaminophen (TYLENOL) 500 MG tablet Take 1,000 mg by mouth  every 6 (six) hours as needed for mild pain. Reported on 04/17/2015    . fluticasone (FLONASE) 50 MCG/ACT nasal spray Place 1 spray into both nostrils 2 (two) times daily.     Marland Kitchen letrozole (FEMARA) 2.5 MG tablet Take 1 tablet (2.5 mg total) by mouth daily. 90 tablet 4  . levothyroxine (SYNTHROID, LEVOTHROID) 88 MCG tablet Take 88 mcg by mouth daily before breakfast.    . losartan (COZAAR) 25 MG tablet Take 25 mg by mouth daily.    . Lutein 6 MG TABS Take 6 mg by mouth 2 (two) times daily.     . magnesium gluconate (MAGONATE) 500 MG tablet Take 500 mg by mouth at bedtime.    . meclizine (ANTIVERT) 25 MG tablet Take 25 mg by mouth 3 (three) times daily as needed for dizziness.    Marland Kitchen METRONIDAZOLE, TOPICAL, 0.75 % LOTN Apply 1 application topically at bedtime. Apply to face for rosacea    . palbociclib (IBRANCE) 100 MG capsule Take 1 capsule (100 mg total) by mouth daily with breakfast. Take whole with food on days 1-21, every 28 days. 21 capsule 6  . pantoprazole (PROTONIX) 40 MG tablet Take 40 mg by mouth 2 (two) times daily. Before breakfast and before supper    .  Polyethyl Glycol-Propyl Glycol (SYSTANE ULTRA) 0.4-0.3 % SOLN Place 1-2 drops into both eyes 2 (two) times daily.    . polyethylene glycol (MIRALAX / GLYCOLAX) packet Take 4 g by mouth daily. 1 teaspoonful in the morning    . rOPINIRole (REQUIP) 0.5 MG tablet Take 0.5 mg by mouth 3 (three) times daily.    . simvastatin (ZOCOR) 40 MG tablet Take 40 mg by mouth at bedtime.     . traZODone (DESYREL) 50 MG tablet Take 25 mg by mouth at bedtime.      No current facility-administered medications for this visit.     OBJECTIVE: Middle-aged white woman In no acute distress Vitals:   09/29/16 1435  BP: (!) 152/77  Pulse: 63  Resp: 17  Temp: 97.6 F (36.4 C)     Body mass index is 27.1 kg/m.    ECOG FS:1 - Symptomatic but completely ambulatory  Sclerae unicteric, EOMs intact Oropharynx clear and moist No cervical or supraclavicular  adenopathy Lungs no rales or rhonchi Heart regular rate and rhythm Abd soft, nontender, positive bowel sounds MSK no focal spinal tenderness, no upper extremity lymphedema Neuro: nonfocal, well oriented, appropriate affect Breasts: The right breast is status post mastectomy with no evidence of local recurrence. The left breast is benign. Both axillae are benign.  LAB RESULTS: CMP     Component Value Date/Time   NA 135 (L) 09/26/2016 0903   K 4.3 09/26/2016 0903   CL 107 03/14/2016 0412   CO2 26 09/26/2016 0903   GLUCOSE 86 09/26/2016 0903   BUN 18.8 09/26/2016 0903   CREATININE 0.9 09/26/2016 0903   CALCIUM 9.4 09/26/2016 0903   PROT 6.7 09/26/2016 0903   ALBUMIN 4.4 09/26/2016 0903   AST 20 09/26/2016 0903   ALT 13 09/26/2016 0903   ALKPHOS 60 09/26/2016 0903   BILITOT 0.59 09/26/2016 0903   GFRNONAA >60 03/14/2016 0412   GFRAA >60 03/14/2016 0412    I No results found for: SPEP  Lab Results  Component Value Date   WBC 2.2 (L) 09/26/2016   NEUTROABS 1.2 (L) 09/26/2016   HGB 13.1 09/26/2016   HCT 38.4 09/26/2016   MCV 93.2 09/26/2016   PLT 205 09/26/2016      Chemistry      Component Value Date/Time   NA 135 (L) 09/26/2016 0903   K 4.3 09/26/2016 0903   CL 107 03/14/2016 0412   CO2 26 09/26/2016 0903   BUN 18.8 09/26/2016 0903   CREATININE 0.9 09/26/2016 0903      Component Value Date/Time   CALCIUM 9.4 09/26/2016 0903   ALKPHOS 60 09/26/2016 0903   AST 20 09/26/2016 0903   ALT 13 09/26/2016 0903   BILITOT 0.59 09/26/2016 0903      No results for input(s): INR in the last 168 hours.  Urinalysis    Component Value Date/Time   COLORURINE STRAW (A) 03/05/2016 1350    STUDIES: Nm Bone Scan Whole Body  Result Date: 09/26/2016 CLINICAL DATA:  Breast cancer.  Metastatic disease . EXAM: NUCLEAR MEDICINE WHOLE BODY BONE SCAN TECHNIQUE: Whole body anterior and posterior images were obtained approximately 3 hours after intravenous injection of  radiopharmaceutical. RADIOPHARMACEUTICALS:  21.7 MCi Technetium-55mMDP IV COMPARISON:  PET CT 04/04/2016.  Bone scan 07/05/2015. FINDINGS: Bilateral renal function excretion. No new focal areas of increased activity identified. Interval left hip replacement. Mild stable increased activity noted in the distal femurs and proximal tibias. Mild stable increased activity noted midthoracic spine. IMPRESSION: No  significant new active bony lesions are identified. Electronically Signed   By: Jade Mooney  Register   On: 09/26/2016 12:57     ASSESSMENT: 71 y.o. [place] woman with stage IV right breast cancer (bone metastatic disease, subcutaneous nodules)  (1) s/p Right mastectomy 1997 for an estrogen receptor and HER-2 positive breast cancer, treated with  (a) adjuvant chemotherapy according to CALGB 9640 (taxotere x 4 cycles)  (b) high-dose chemotherapy (cyclophosphamide, carboplatin, BCNU) followed by stem cell rescue at [place] 12/22/1995  (c) biopsy-proven metastatic disease November 2007, estrogen receptor and HER-2 positive  (d) trastuzumab (dose? Duration?)  (e) tamoxifen for 5 years completed 2002  (f) participation in a vaccine study at [facility] 2003 (CEA primed dendritic cells, HER-2 primed dendritic cells)   (2) status post right iliac crest biopsy 10/10/2013 for invasive ductal carcinoma, grade 2, estrogen and progesterone receptor positive  (3) zolendronate started 10/12/2013,  repeated every 12 weeks, next dose due April 2018  (a) dexa scan 07/28/2013 normal  (4) biopsy of a scalp lesion 10/17/2013 showed metastatic breast cancer, HER-2/neu negative  (a) biopsy of the right anterior chest wall nodule August 2017 shows adenocarcinoma  (5) anastrozole started 10/28/2013, changed to letrozole 06/27/2016  (a) measurable disease = subcutaneous nodules in scalp and LLQ abdomen  (b)  CA-27-29 is informative  (c)  Repeat PET scan 12/28/2014 shows continuing response ; exam shows resolution of scalp  lesions  (d) new skin lesion removed from right anterior chest wall August 2017  (e) status post radiation to the anterior lower right chest wall completed 02/25/2017 (20 sessions)   (f) PET scan 04/04/2016 shows 2 new foci of metabolic activity (T8 and manubrium)  (g) palbociclib added 04/21/2016 at 125 mg 21/7  (h) palbociclib dose dropped to 100 mg 21/7 starting with the March 2018 cycle  (i) bone scan 09/26/2016 is stable    (6) neoplasia associated pain  (a) painful blastic lesion left femoral intertrochanteric region: Status post radiation completed November 2015  (b) pain left knee, status post TKR remotely  (c) left total hip replacement 03/12/2016  PLAN: Bailei is now 3 years out from pathologically confirmed diagnosis of metastatic breast cancer, with well-controlled disease. She tolerates the letrozole and palbociclib well, and we will proceed with that dose of zolendronate today.  We reviewed her bone scan which is essentially stable, and noted that her CA-27-29 had begun to drop when last checked. This is suggestive of an initial response to treatment.  They're going to be leaving for [place] the second week in August. I suggested she have a CBC drawn locally just before the trip to make sure her counts are holding up and so she does not have to worry about infectious complications while away.  Otherwise the plan continues to be for palbociclib at the current dose, which she is obtaining through samples from our oral chemotherapy pharmacy specialist, and letrozole.  I am actually pleased that she has gained some weight. If she wishes to not gained further weight she will have to cut back on her carbohydrates and we discussed the diet she can go on to achieve that with minimal restrictions.  She will see me again in 3 months, with her next zolendronate dose.  She knows to call for any problems that may develop before that visit.   Chauncey Cruel, MD   09/29/2016 5:35 PM

## 2016-09-29 NOTE — Progress Notes (Signed)
Oral Chemotherapy Pharmacist Encounter  Dispensed samples to patient:  Medication: Ibrance 100mg  capsules Instructions: Take 1 capsule by mouth once daily with breakfast. Take for 21 days on, 7 days off, repeat every 28 days Quantity dispensed: 42 Days supply: 56 Manufacturer: Pfizer Lot: [id number] Exp: 06/2018  Johny Drilling, PharmD, BCPS, BCOP 09/29/2016 1:52 PM Oral Oncology Clinic [phone]

## 2016-09-29 NOTE — Patient Instructions (Signed)

## 2016-10-08 DIAGNOSIS — D510 Vitamin B12 deficiency anemia due to intrinsic factor deficiency: Secondary | ICD-10-CM | POA: Diagnosis not present

## 2016-11-10 DIAGNOSIS — D510 Vitamin B12 deficiency anemia due to intrinsic factor deficiency: Secondary | ICD-10-CM | POA: Diagnosis not present

## 2016-11-21 ENCOUNTER — Telehealth: Payer: Self-pay | Admitting: Pharmacist

## 2016-11-21 NOTE — Telephone Encounter (Signed)
Oral Chemotherapy Pharmacist Encounter  Follow-Up Form  Spoke with patient today to follow up regarding patient's oral chemotherapy medication: Ibrance for the treatment of metastatic, hormone receptor positive, Her-2 negative breast cancer in combination with Femara until disease progression or unacceptable toxicity  Original Start date of oral chemotherapy: 04/21/16  Pt is doing well today  Pt reports 0 tablets/doses of Ibrance '100mg'$  capsules, 1 capsule by mouth once daily for 3 weeks on, 1 week off missed in the last month.   Pt reports the following side effects: mild fatigue, managable  Pertinent labs reviewed from 09/26/16: OK for continued treatment.  Other Issues: patient is almost out of Ibrance samples  Dispensed samples to patient:  Medication: Ibrance '100mg'$  capsules Instructions: Take 1 capsule by mouth once daily with breakfast for 3 weeks on, 1 week off Quantity dispensed: 63 Days supply: 84 Manufacturer: [place] Lot: [id number] Exp: 06/2018  Patient will come to [facility] at some point the week of 9/4-9/7 to pick up this 3 month supply.   MD appointment for 01/12/17 confirmed with patient.  Patient knows to call the office with questions or concerns. Oral Oncology Clinic will continue to follow.  Thank you,  Johny Drilling, PharmD, BCPS, BCOP 11/21/2016 1:31 PM Oral Oncology Clinic [phone]

## 2016-12-01 ENCOUNTER — Telehealth: Payer: Self-pay | Admitting: Pharmacist

## 2016-12-01 DIAGNOSIS — D510 Vitamin B12 deficiency anemia due to intrinsic factor deficiency: Secondary | ICD-10-CM | POA: Diagnosis not present

## 2016-12-01 DIAGNOSIS — C50811 Malignant neoplasm of overlapping sites of right female breast: Secondary | ICD-10-CM

## 2016-12-01 DIAGNOSIS — E039 Hypothyroidism, unspecified: Secondary | ICD-10-CM | POA: Diagnosis not present

## 2016-12-01 DIAGNOSIS — Z170 Estrogen receptor positive status [ER+]: Principal | ICD-10-CM

## 2016-12-01 DIAGNOSIS — E782 Mixed hyperlipidemia: Secondary | ICD-10-CM | POA: Diagnosis not present

## 2016-12-01 DIAGNOSIS — Z79899 Other long term (current) drug therapy: Secondary | ICD-10-CM | POA: Diagnosis not present

## 2016-12-01 MED ORDER — PALBOCICLIB 75 MG PO CAPS: 75.0000 mg | ORAL_CAPSULE | Freq: Every day | ORAL | 2 refills | Status: DC

## 2016-12-01 NOTE — Telephone Encounter (Signed)
Oral Chemotherapy Pharmacist Encounter  Received call from patient that she had [place] checked close to her home. She is to start her next cycle this Wednesday (12/03/16) ANC today 0.9 Discussed with Dr. Jana Hakim, patient OK to start Ibrance this Wednesday, but at decreased dose of 75mg  from 100mg  current dose.  I called and informed patient about dose decrease. Patient will come to [facility] on 12/02/16 to pick-up Ibrance samples to start next cycle without delay. Ibrance start date: 04/21/16 Patient has missed 0 doses of her Ibrance 100mg  capsules, take 1 capsule (100mg ) by mouth once daily with breakfast for 21 days on, 7 days off, repeat every 28 days in the past month.  Dispensed samples to patient:  Medication: Ibrance 75mg  capsules Instructions: Take 1 capsule (75mg ) by mouth once daily with breakfast for 21 days on, 7 days off, repeat every 28 days. Quantity dispensed: 42 Days supply: 56 Manufacturer: [place] Lot: [id number] Exp: 05/2018  I will follow-up [place] visit with Dr. Jana Hakim on 01/12/17 for continued Ibrance needs.  Johny Drilling, PharmD, BCPS, BCOP 12/01/2016 12:11 PM Oral Oncology Clinic [phone]

## 2016-12-08 DIAGNOSIS — Z0000 Encounter for general adult medical examination without abnormal findings: Secondary | ICD-10-CM | POA: Diagnosis not present

## 2016-12-08 DIAGNOSIS — R42 Dizziness and giddiness: Secondary | ICD-10-CM | POA: Diagnosis not present

## 2016-12-08 DIAGNOSIS — Z6828 Body mass index (BMI) 28.0-28.9, adult: Secondary | ICD-10-CM | POA: Diagnosis not present

## 2016-12-08 DIAGNOSIS — Z1389 Encounter for screening for other disorder: Secondary | ICD-10-CM | POA: Diagnosis not present

## 2016-12-08 DIAGNOSIS — I10 Essential (primary) hypertension: Secondary | ICD-10-CM | POA: Diagnosis not present

## 2016-12-25 ENCOUNTER — Telehealth: Payer: Self-pay | Admitting: *Deleted

## 2016-12-25 NOTE — Telephone Encounter (Signed)
This RN returned call to pt per her VM stating she has completed cycle for Ibrance and would be due to restart on 10/10 post obtaining lab.  Problem is she will be leaving on trip 10/6 and will not return 10/13.  She is inquiring if she should restart on the 10th without lab or wait until the 15th - get lab then start if appropriate.  This RN informed pt she should wait until the 15th.  No other needs at this time.

## 2017-01-03 DIAGNOSIS — B029 Zoster without complications: Secondary | ICD-10-CM | POA: Diagnosis not present

## 2017-01-05 DIAGNOSIS — H353131 Nonexudative age-related macular degeneration, bilateral, early dry stage: Secondary | ICD-10-CM | POA: Diagnosis not present

## 2017-01-05 DIAGNOSIS — H26493 Other secondary cataract, bilateral: Secondary | ICD-10-CM | POA: Diagnosis not present

## 2017-01-05 DIAGNOSIS — D510 Vitamin B12 deficiency anemia due to intrinsic factor deficiency: Secondary | ICD-10-CM | POA: Diagnosis not present

## 2017-01-06 ENCOUNTER — Telehealth: Payer: Self-pay

## 2017-01-06 NOTE — Telephone Encounter (Signed)
Pt has shingles. Developed while on her trip. She went to [place] on Saturday. Gabapentin and hydrocodone and acyclovir were prescribed. Shingles is on stomach, through her r groin and down her r leg to mid thigh.  Also a large spot on r buttock.   She is due to start ibrance yesterday after bloodwork. bloodwork at [place]- her gran is 1.7. Which is ok for ibrance but in light of the shingles should she start?  S/w Dr Jana Hakim and told pt hold ibrance until she sees him on 10/22.

## 2017-01-07 DIAGNOSIS — Z6828 Body mass index (BMI) 28.0-28.9, adult: Secondary | ICD-10-CM | POA: Diagnosis not present

## 2017-01-07 DIAGNOSIS — Z1339 Encounter for screening examination for other mental health and behavioral disorders: Secondary | ICD-10-CM | POA: Diagnosis not present

## 2017-01-07 DIAGNOSIS — E663 Overweight: Secondary | ICD-10-CM | POA: Diagnosis not present

## 2017-01-07 DIAGNOSIS — B029 Zoster without complications: Secondary | ICD-10-CM | POA: Diagnosis not present

## 2017-01-11 NOTE — Progress Notes (Signed)
[place]  [phone] [phone]   ID: Jade Mooney DOB: 1945/06/17  MR#: [id number]  XTG#:[id number]  PCP: Myer Peer, MD GYN: Newton Pigg MD SU:  OTHER MD: Clarene Essex MD, Leslie Andrea M.D., Gatha Mayer MD  CHIEF COMPLAINT: Newly diagnosed metastatic breast cancer  CURRENT TREATMENT: Zolendronate, letrozole, palbociclib  INTERVAL HISTORY: Jade Mooney today for follow-up and treatment of her estrogen receptor positive breast cancer. She continues on letrozole, with good tolerance  She is currently not taking palbociclib, but she was on 75 mg daily, 21 days on, 7 days off. She d/c taking the Ibrance on 12/31/2016 due to recent dx of shingles. She is taking acyclovir, gabapentin, and hydrocodone to aid with her shingles pain. She takes 1 hydrocodone at night to aid with sleeping. She is also taking a 10 day tapered prednisone dosepak that was prescribed by Dr. Nicki Reaper. She had a shingles vaccine and never had chicken pox as a child according to her mother. The lesions have mostly crusted over and are localized to her right inguinal, right pubic, right inner thigh, and right buttocks.   Jade Mooney also receives zolendronate every 12 weeks, with a dose due today.  Her most recent bone scan 09/26/2016 showed no new active bone lesions. Her most recent PET scan was obtained 04/04/2016 and showed no evidence of visceral disease.  REVIEW OF SYSTEMS: Jade Mooney reports lower back pain that she treats with 2 naprosyn BID. She consumes a lot of water and denies constipation. She recently travelled to [place], [place], [place], and [place]. For exercise, she mostly does housework. She denies unusual headaches, visual changes, nausea, vomiting, or dizziness. There has been no unusual cough, phlegm production, or pleurisy. This been no change in bowel or bladder habits. She denies unexplained fatigue or unexplained weight loss, bleeding, rash, or fever.  A detailed review of systems was otherwise stable.    BREAST CANCER HISTORY: From the earlier summary notes:  Jade Mooney was referred to Dr. Clarene Essex for evaluation of abdominal discomfort and nausea. Exam and blood work including liver function tests was unremarkable aside from normochromic normocytic anemia. Cholecystitis was suspected and the patient underwent endoscopy followed by abdominal/ pelvic CT scan on 09/29/2013. This showed showed a normal gallbladder. Incidental findings included multiple simple cysts in the liver and aortic atherosclerosis. However mixed lytic and sclerotic bony lesions were noted. On 10/10/2013 the patient underwent biopsy of the right iliac bone, and this showed ([id number]) metastatic invasive ductal carcinoma, grade 2, estrogen and progesterone receptor strongly positive.  Jade Mooney has a remote history of breast cancer, which we tried to reconstruct. She was originally diagnosed early in 1997 with stage III disease, and underwent right mastectomy followed by adjuvant chemotherapy. She then participated in a [facility] protocol with high-dose chemotherapy followed by stem cell rescue 12/22/1995. Unfortunately later that year liver lesions were noted and were biopsy-proven to be metastatic breast cancer. This was not felt to be resectable. However the tumor was estrogen receptor positive and HER-2 positive. The patient was treated with Herceptin (she does not know for what period of time) and was then on tamoxifen until November of 2002. She also participated in a vaccine study at [facility].  Jade Mooney's subsequent history is as detailed below   PAST MEDICAL HISTORY: Past Medical History:  Diagnosis Date  . Anxiety   . Cancer [facility])    Breast 1997 right tx with mastectomy and chemo, metastatic now  . GERD (gastroesophageal reflux disease)   .  Hypercholesterolemia   . Hypertension   . Hypothyroidism   . Osteoarthritis    oa  . TIA (transient ischemic attack) last 11-08-15   x  2 total    PAST SURGICAL HISTORY: Past Surgical History:  Procedure Laterality Date  . CATARACT EXTRACTION, BILATERAL    . MASTECTOMY MODIFIED RADICAL Right 1997  . porta cath insertion  1997   later removed  . radiation tx  finished 02-25-16   x 20 tx  . TONSILLECTOMY    . TOTAL HIP ARTHROPLASTY Left 03/12/2016   Procedure: LEFT TOTAL HIP ARTHROPLASTY ANTERIOR APPROACH;  Surgeon: Gaynelle Arabian, MD;  Location: [facility];  Service: Orthopedics;  Laterality: Left;  . TOTAL KNEE ARTHROPLASTY Left   . TUBAL LIGATION      FAMILY HISTORY No family history on file. The patient's father died at the age of 61 from heart disease. The patient's mother died at the age of 7. The patient had no brothers, one sister. One of the patient's mother's 5 sisters was diagnosed with breast cancer in her 1s  GYNECOLOGIC HISTORY:  No LMP recorded. Patient is postmenopausal. Menarche age 39, the patient is GX P0. She stopped having periods approximately 1994. She used hormone replacement until 1997. 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 Her husband Jade Mooney (goes by "Jade Mooney" was [employer]. of information all services at [employer] [place] [employer] [place]. They live alone, with no pets.    ADVANCED DIRECTIVES: In place   HEALTH MAINTENANCE: Social History  Substance Use Topics  . Smoking status: Never Smoker  . Smokeless tobacco: Never Used  . Alcohol use Yes     Comment: 1 glass wine 4-5 x week     Colonoscopy: 2013  PAP: 2010  Bone density: 2012  Lipid panel:  Allergies  Allergen Reactions  . Amoxicillin Hives    Has patient had a PCN reaction causing immediate rash, facial/tongue/throat swelling, SOB or lightheadedness with hypotension:unsure Has patient had a PCN reaction causing severe rash involving mucus membranes or skin necrosis:No Has patient had a PCN reaction that required hospitalization:No Has  patient had a PCN reaction occurring within the last 10 years: Yes If all of the above answers are "NO", then may proceed with Cephalosporin use.   Marland Kitchen Percocet [Oxycodone-Acetaminophen] Nausea And Vomiting  . Percodan [Oxycodone-Aspirin] Nausea And Vomiting  . Tramadol Itching    Current Outpatient Prescriptions  Medication Sig Dispense Refill  . acetaminophen (TYLENOL) 500 MG tablet Take 1,000 mg by mouth every 6 (six) hours as needed for mild pain. Reported on 04/17/2015    . fluticasone (FLONASE) 50 MCG/ACT nasal spray Place 1 spray into both nostrils 2 (two) times daily.     Marland Kitchen letrozole (FEMARA) 2.5 MG tablet Take 1 tablet (2.5 mg total) by mouth daily. 90 tablet 4  . levothyroxine (SYNTHROID, LEVOTHROID) 88 MCG tablet Take 88 mcg by mouth daily before breakfast.    . losartan (COZAAR) 25 MG tablet Take 25 mg by mouth daily.    . Lutein 6 MG TABS Take 6 mg by mouth 2 (two) times daily.     . magnesium gluconate (MAGONATE) 500 MG tablet Take 500 mg by mouth at bedtime.    . meclizine (ANTIVERT) 25 MG tablet Take 25 mg by mouth 3 (three) times daily as needed for dizziness.    Marland Kitchen METRONIDAZOLE, TOPICAL, 0.75 % LOTN Apply 1 application topically at bedtime.  Apply to face for rosacea    . palbociclib (IBRANCE) 75 MG capsule Take 1 capsule (75 mg total) by mouth daily with breakfast. Take whole with food on days 1-21, every 28 days. 21 capsule 2  . pantoprazole (PROTONIX) 40 MG tablet Take 40 mg by mouth 2 (two) times daily. Before breakfast and before supper    . Polyethyl Glycol-Propyl Glycol (SYSTANE ULTRA) 0.4-0.3 % SOLN Place 1-2 drops into both eyes 2 (two) times daily.    . polyethylene glycol (MIRALAX / GLYCOLAX) packet Take 4 g by mouth daily. 1 teaspoonful in the morning    . rOPINIRole (REQUIP) 0.5 MG tablet Take 0.5 mg by mouth 3 (three) times daily.    . simvastatin (ZOCOR) 40 MG tablet Take 40 mg by mouth at bedtime.     . traZODone (DESYREL) 50 MG tablet Take 25 mg by mouth at  bedtime.      No current facility-administered medications for this visit.     OBJECTIVE: Middle-aged white womanwho appears stated age  Vitals:   01/12/17 1357  BP: (!) 141/64  Pulse: 71  Resp: 18  Temp: 98.1 F (36.7 C)  SpO2: 100%     Body mass index is 28.35 kg/m.    ECOG FS:1 - Symptomatic but completely ambulatory  Sclerae unicteric, pupils round and equal Oropharynx clear and moist No cervical or supraclavicular adenopathy Lungs no rales or rhonchi Heart regular rate and rhythm Abd soft, nontender, positive bowel sounds MSK no focal spinal tenderness, no upper extremity lymphedema Neuro: nonfocal, well oriented, appropriate affect Breasts: She has undergone right mastectomy. There is no evidence of local recurrence. The left breast is unremarkable. Both axillae are benign  LAB RESULTS: CMP     Component Value Date/Time   NA 137 01/12/2017 1324   K 4.1 01/12/2017 1324   CL 107 03/14/2016 0412   CO2 23 01/12/2017 1324   GLUCOSE 201 (H) 01/12/2017 1324   BUN 17.8 01/12/2017 1324   CREATININE 0.9 01/12/2017 1324   CALCIUM 9.3 01/12/2017 1324   PROT 7.1 01/12/2017 1324   ALBUMIN 4.5 01/12/2017 1324   AST 22 01/12/2017 1324   ALT 17 01/12/2017 1324   ALKPHOS 52 01/12/2017 1324   BILITOT 0.43 01/12/2017 1324   GFRNONAA >60 03/14/2016 0412   GFRAA >60 03/14/2016 0412    I No results found for: SPEP  Lab Results  Component Value Date   WBC 8.3 01/12/2017   NEUTROABS 6.6 (H) 01/12/2017   HGB 13.1 01/12/2017   HCT 39.8 01/12/2017   MCV 95.2 01/12/2017   PLT 287 01/12/2017      Chemistry      Component Value Date/Time   NA 137 01/12/2017 1324   K 4.1 01/12/2017 1324   CL 107 03/14/2016 0412   CO2 23 01/12/2017 1324   BUN 17.8 01/12/2017 1324   CREATININE 0.9 01/12/2017 1324      Component Value Date/Time   CALCIUM 9.3 01/12/2017 1324   ALKPHOS 52 01/12/2017 1324   AST 22 01/12/2017 1324   ALT 17 01/12/2017 1324   BILITOT 0.43 01/12/2017 1324       No results for input(s): INR in the last 168 hours.  Urinalysis    Component Value Date/Time   COLORURINE STRAW (A) 03/05/2016 1350    STUDIES: No results found.   ASSESSMENT: 71 y.o. [place] woman with stage IV right breast cancer (bone metastatic disease, subcutaneous nodules)  (1) s/p Right mastectomy 1997 for an estrogen receptor  and HER-2 positive breast cancer, treated with  (a) adjuvant chemotherapy according to CALGB 9640 (taxotere x 4 cycles)  (b) high-dose chemotherapy (cyclophosphamide, carboplatin, BCNU) followed by stem cell rescue at [facility] 12/22/1995  (c) biopsy-proven metastatic disease November 2007, estrogen receptor and HER-2 positive  (d) trastuzumab (dose? Duration?)  (e) tamoxifen for 5 years completed 2002  (f) participation in a vaccine study at [facility] 2003 (CEA primed dendritic cells, HER-2 primed dendritic cells)   (2) status post right iliac crest biopsy 10/10/2013 for invasive ductal carcinoma, grade 2, estrogen and progesterone receptor positive  (3) zolendronate started 10/12/2013,  repeated every 12 weeks, next dose due April 2018  (a) dexa scan 07/28/2013 normal  (4) biopsy of a scalp lesion 10/17/2013 showed metastatic breast cancer, HER-2/neu negative  (a) biopsy of the right anterior chest wall nodule August 2017 shows adenocarcinoma  (5) anastrozole started 10/28/2013, changed to letrozole 06/27/2016  (a) measurable disease = subcutaneous nodules in scalp and LLQ abdomen  (b)  CA-27-29 is informative  (c)  Repeat PET scan 12/28/2014 shows continuing response ; exam shows resolution of scalp lesions  (d) new skin lesion removed from right anterior chest wall August 2017  (e) status post radiation to the anterior lower right chest wall completed 02/25/2017 (20 sessions)   (f) PET scan 04/04/2016 shows 2 new foci of metabolic activity (T8 and manubrium)  (g) palbociclib added 04/21/2016 at 125 mg 21/7  (h) palbociclib dose dropped to 100 mg  21/7 starting with the March 2018 cycle  (i) bone scan 09/26/2016 is stable    (6) neoplasia associated pain  (a) painful blastic lesion left femoral intertrochanteric region: Status post radiation completed November 2015  (b) pain left knee, status post TKR remotely  (c) left total hip replacement 03/12/2016  PLAN: I spent approximately 30 minutes with Jade Mooney with most of that time spent discussing her complex problems. She is now 3 years out from pathologically confirmed metastatic breast cancer, and her disease is in good clinical control.  She tolerates the letrozole and zoledronate well and she receives the zolendronate today.  However the shingles continues to be a problem and it is not entirely controlled at this point.   We are going to hold the palbociclib another month. Even though her counts are excellent, I am concerned that she is having a little trouble checking her current shingles. Some of the lesions are still vesicular. We discussed the fact that she needs to continue the antivirals until either the lesions have resolved or she returns to see me, which will be in 28 days. At that point we will consider resuming palbociclib.  Before that visit we will obtain a restaging PET scan I am hopeful that we do not see any visceral disease.  She knows to call for any problems that may develop before her next visit here.  Daison Braxton, Virgie Dad, MD  01/12/17 2:27 PM Medical Oncology and Hematology [facility] [address] [place], [place] [address] Tel. [phone]    Fax. [phone]  This document serves as a record of services personally performed by Lurline Del, MD. It was created on her behalf by Steva Colder, a trained medical scribe. The creation of this record is based on the scribe's personal observations and the provider's statements to them. This document has been checked and approved by the attending provider.

## 2017-01-12 ENCOUNTER — Other Ambulatory Visit (HOSPITAL_BASED_OUTPATIENT_CLINIC_OR_DEPARTMENT_OTHER): Payer: Medicare Other

## 2017-01-12 ENCOUNTER — Telehealth: Payer: Self-pay | Admitting: Oncology

## 2017-01-12 ENCOUNTER — Ambulatory Visit (HOSPITAL_BASED_OUTPATIENT_CLINIC_OR_DEPARTMENT_OTHER): Payer: Medicare Other

## 2017-01-12 ENCOUNTER — Ambulatory Visit (HOSPITAL_BASED_OUTPATIENT_CLINIC_OR_DEPARTMENT_OTHER): Payer: Medicare Other | Admitting: Oncology

## 2017-01-12 ENCOUNTER — Other Ambulatory Visit: Payer: Self-pay | Admitting: Pharmacist

## 2017-01-12 VITALS — BP 141/64 | HR 71 | Temp 98.1°F | Resp 18 | Ht 63.5 in | Wt 162.6 lb

## 2017-01-12 DIAGNOSIS — C50912 Malignant neoplasm of unspecified site of left female breast: Secondary | ICD-10-CM

## 2017-01-12 DIAGNOSIS — C50919 Malignant neoplasm of unspecified site of unspecified female breast: Secondary | ICD-10-CM | POA: Diagnosis not present

## 2017-01-12 DIAGNOSIS — B029 Zoster without complications: Secondary | ICD-10-CM

## 2017-01-12 DIAGNOSIS — C44501 Unspecified malignant neoplasm of skin of breast: Secondary | ICD-10-CM

## 2017-01-12 DIAGNOSIS — C7951 Secondary malignant neoplasm of bone: Secondary | ICD-10-CM

## 2017-01-12 DIAGNOSIS — C50811 Malignant neoplasm of overlapping sites of right female breast: Secondary | ICD-10-CM | POA: Diagnosis not present

## 2017-01-12 DIAGNOSIS — C7989 Secondary malignant neoplasm of other specified sites: Secondary | ICD-10-CM

## 2017-01-12 DIAGNOSIS — M545 Low back pain: Secondary | ICD-10-CM

## 2017-01-12 DIAGNOSIS — Z170 Estrogen receptor positive status [ER+]: Principal | ICD-10-CM

## 2017-01-12 DIAGNOSIS — C50911 Malignant neoplasm of unspecified site of right female breast: Secondary | ICD-10-CM

## 2017-01-12 LAB — COMPREHENSIVE METABOLIC PANEL
ALT: 17 U/L (ref 0–55)
AST: 22 U/L (ref 5–34)
Albumin: 4.5 g/dL (ref 3.5–5.0)
Alkaline Phosphatase: 52 U/L (ref 40–150)
Anion Gap: 14 mEq/L — ABNORMAL HIGH (ref 3–11)
BUN: 17.8 mg/dL (ref 7.0–26.0)
CO2: 23 mEq/L (ref 22–29)
Calcium: 9.3 mg/dL (ref 8.4–10.4)
Chloride: 99 mEq/L (ref 98–109)
Creatinine: 0.9 mg/dL (ref 0.6–1.1)
EGFR: >60 mL/min/{1.73_m2} (ref >60)
Glucose: 201 mg/dl — ABNORMAL HIGH (ref 70–140)
Potassium: 4.1 mEq/L (ref 3.5–5.1)
Sodium: 137 mEq/L (ref 136–145)
Total Bilirubin: 0.43 mg/dL (ref 0.20–1.20)
Total Protein: 7.1 g/dL (ref 6.4–8.3)

## 2017-01-12 LAB — CBC WITH DIFFERENTIAL/PLATELET
BASO%: 0.8 % (ref 0.0–2.0)
Basophils Absolute: 0.1 10*3/uL (ref 0.0–0.1)
EOS%: 0.2 % (ref 0.0–7.0)
Eosinophils Absolute: 0 10*3/uL (ref 0.0–0.5)
HCT: 39.8 % (ref 34.8–46.6)
HGB: 13.1 g/dL (ref 11.6–15.9)
LYMPH%: 15.9 % (ref 14.0–49.7)
MCH: 31.3 pg (ref 25.1–34.0)
MCHC: 32.9 g/dL (ref 31.5–36.0)
MCV: 95.2 fL (ref 79.5–101.0)
MONO#: 0.3 10*3/uL (ref 0.1–0.9)
MONO%: 3.7 % (ref 0.0–14.0)
NEUT#: 6.6 10*3/uL — ABNORMAL HIGH (ref 1.5–6.5)
NEUT%: 79.4 % — ABNORMAL HIGH (ref 38.4–76.8)
Platelets: 287 10*3/uL (ref 145–400)
RBC: 4.18 10*6/uL (ref 3.70–5.45)
RDW: 14.7 % — ABNORMAL HIGH (ref 11.2–14.5)
WBC: 8.3 10*3/uL (ref 3.9–10.3)
lymph#: 1.3 10*3/uL (ref 0.9–3.3)

## 2017-01-12 LAB — TECHNOLOGIST REVIEW

## 2017-01-12 MED ORDER — ZOLEDRONIC ACID 4 MG/100ML IV SOLN
4.0000 mg | Freq: Once | INTRAVENOUS | Status: AC
Start: 2017-01-12 — End: 2017-01-12
  Administered 2017-01-12: 4 mg via INTRAVENOUS
  Filled 2017-01-12: qty 100

## 2017-01-12 NOTE — Telephone Encounter (Signed)
Gave patient avs report and appointments for November. Central radiology will call re scan.  °

## 2017-01-12 NOTE — Patient Instructions (Signed)

## 2017-01-13 LAB — CANCER ANTIGEN 27.29: CA 27.29: 69.5 U/mL — ABNORMAL HIGH (ref 0.0–38.6)

## 2017-01-23 ENCOUNTER — Other Ambulatory Visit: Payer: Self-pay | Admitting: Family Medicine

## 2017-01-23 DIAGNOSIS — Z1231 Encounter for screening mammogram for malignant neoplasm of breast: Secondary | ICD-10-CM

## 2017-01-27 DIAGNOSIS — S60943A Unspecified superficial injury of left middle finger, initial encounter: Secondary | ICD-10-CM | POA: Diagnosis not present

## 2017-02-03 ENCOUNTER — Ambulatory Visit (HOSPITAL_COMMUNITY)
Admission: RE | Admit: 2017-02-03 | Discharge: 2017-02-03 | Disposition: A | Discharge status: Home / Self Care | Payer: Medicare Other | Source: Ambulatory Visit | Attending: Oncology | Admitting: Oncology

## 2017-02-03 DIAGNOSIS — R911 Solitary pulmonary nodule: Secondary | ICD-10-CM | POA: Insufficient documentation

## 2017-02-03 DIAGNOSIS — Z9011 Acquired absence of right breast and nipple: Secondary | ICD-10-CM | POA: Diagnosis not present

## 2017-02-03 DIAGNOSIS — C7951 Secondary malignant neoplasm of bone: Secondary | ICD-10-CM | POA: Insufficient documentation

## 2017-02-03 DIAGNOSIS — C50811 Malignant neoplasm of overlapping sites of right female breast: Secondary | ICD-10-CM | POA: Diagnosis not present

## 2017-02-03 DIAGNOSIS — Z170 Estrogen receptor positive status [ER+]: Secondary | ICD-10-CM | POA: Diagnosis not present

## 2017-02-03 DIAGNOSIS — Z79899 Other long term (current) drug therapy: Secondary | ICD-10-CM | POA: Insufficient documentation

## 2017-02-03 DIAGNOSIS — C50919 Malignant neoplasm of unspecified site of unspecified female breast: Secondary | ICD-10-CM | POA: Diagnosis not present

## 2017-02-03 LAB — GLUCOSE, CAPILLARY: Glucose-Capillary: 109 mg/dL — ABNORMAL HIGH (ref 65–99)

## 2017-02-03 MED ORDER — FLUDEOXYGLUCOSE F - 18 (FDG) INJECTION
7.9900 | Freq: Once | INTRAVENOUS | Status: AC | PRN
  Administered 2017-02-03: 7.99 via INTRAVENOUS

## 2017-02-04 DIAGNOSIS — D510 Vitamin B12 deficiency anemia due to intrinsic factor deficiency: Secondary | ICD-10-CM | POA: Diagnosis not present

## 2017-02-07 ENCOUNTER — Other Ambulatory Visit: Payer: Self-pay | Admitting: Oncology

## 2017-02-07 NOTE — Progress Notes (Signed)
I called wife and let her know that we do not see any visceral disease on the PET scan.  We do see a couple of new bone spots.  Cancer has apparently taken advantage of the fact that we stopped the palbociclib for a couple of months.  We will be resuming that when she returns to see as 1190

## 2017-02-09 ENCOUNTER — Other Ambulatory Visit (HOSPITAL_BASED_OUTPATIENT_CLINIC_OR_DEPARTMENT_OTHER): Payer: Medicare Other

## 2017-02-09 ENCOUNTER — Ambulatory Visit (HOSPITAL_BASED_OUTPATIENT_CLINIC_OR_DEPARTMENT_OTHER): Payer: Medicare Other | Admitting: Adult Health

## 2017-02-09 ENCOUNTER — Telehealth: Payer: Self-pay | Admitting: Oncology

## 2017-02-09 ENCOUNTER — Encounter: Payer: Self-pay | Admitting: Adult Health

## 2017-02-09 VITALS — BP 155/75 | HR 62 | Temp 97.8°F | Resp 20 | Ht 63.5 in | Wt 163.5 lb

## 2017-02-09 DIAGNOSIS — C7951 Secondary malignant neoplasm of bone: Secondary | ICD-10-CM

## 2017-02-09 DIAGNOSIS — C44501 Unspecified malignant neoplasm of skin of breast: Secondary | ICD-10-CM

## 2017-02-09 DIAGNOSIS — C50919 Malignant neoplasm of unspecified site of unspecified female breast: Secondary | ICD-10-CM

## 2017-02-09 DIAGNOSIS — C7989 Secondary malignant neoplasm of other specified sites: Secondary | ICD-10-CM | POA: Diagnosis not present

## 2017-02-09 DIAGNOSIS — C50911 Malignant neoplasm of unspecified site of right female breast: Secondary | ICD-10-CM

## 2017-02-09 DIAGNOSIS — C50811 Malignant neoplasm of overlapping sites of right female breast: Secondary | ICD-10-CM

## 2017-02-09 DIAGNOSIS — Z170 Estrogen receptor positive status [ER+]: Secondary | ICD-10-CM

## 2017-02-09 LAB — COMPREHENSIVE METABOLIC PANEL
ALT: 18 U/L (ref 0–55)
AST: 24 U/L (ref 5–34)
Albumin: 4.1 g/dL (ref 3.5–5.0)
Alkaline Phosphatase: 62 U/L (ref 40–150)
Anion Gap: 9 mEq/L (ref 3–11)
BUN: 12 mg/dL (ref 7.0–26.0)
CO2: 27 mEq/L (ref 22–29)
Calcium: 8.7 mg/dL (ref 8.4–10.4)
Chloride: 102 mEq/L (ref 98–109)
Creatinine: 0.9 mg/dL (ref 0.6–1.1)
EGFR: >60 mL/min/{1.73_m2} (ref >60)
Glucose: 100 mg/dl (ref 70–140)
Potassium: 3.9 mEq/L (ref 3.5–5.1)
Sodium: 138 mEq/L (ref 136–145)
Total Bilirubin: 0.34 mg/dL (ref 0.20–1.20)
Total Protein: 6.5 g/dL (ref 6.4–8.3)

## 2017-02-09 LAB — CBC WITH DIFFERENTIAL/PLATELET
BASO%: 0.4 % (ref 0.0–2.0)
Basophils Absolute: 0 10*3/uL (ref 0.0–0.1)
EOS%: 2.9 % (ref 0.0–7.0)
Eosinophils Absolute: 0.1 10*3/uL (ref 0.0–0.5)
HCT: 38.5 % (ref 34.8–46.6)
HGB: 12.5 g/dL (ref 11.6–15.9)
LYMPH%: 21.6 % (ref 14.0–49.7)
MCH: 30.8 pg (ref 25.1–34.0)
MCHC: 32.5 g/dL (ref 31.5–36.0)
MCV: 94.8 fL (ref 79.5–101.0)
MONO#: 0.4 10*3/uL (ref 0.1–0.9)
MONO%: 8.4 % (ref 0.0–14.0)
NEUT#: 3 10*3/uL (ref 1.5–6.5)
NEUT%: 66.7 % (ref 38.4–76.8)
Platelets: 193 10*3/uL (ref 145–400)
RBC: 4.06 10*6/uL (ref 3.70–5.45)
RDW: 14.3 % (ref 11.2–14.5)
WBC: 4.5 10*3/uL (ref 3.9–10.3)
lymph#: 1 10*3/uL (ref 0.9–3.3)

## 2017-02-09 NOTE — Progress Notes (Addendum)
[place]  [phone] [phone]   ID: ARAIYA TILMON DOB: June 14, 1945  MR#: [id number]  XLK#:[id number]  PCP: Myer Peer, MD GYN: Newton Pigg MD SU:  OTHER MD: Clarene Essex MD, Leslie Andrea M.D., Gatha Mayer MD  CHIEF COMPLAINT: Newly diagnosed metastatic breast cancer  CURRENT TREATMENT: Zolendronate, letrozole, palbociclib  INTERVAL HISTORY: Jade Mooney today for follow-up and treatment of her estrogen receptor positive breast cancer. She continues on letrozole, with good tolerance.  She has noted some hair thinning and an increase in appetite.    She is currently not taking palbociclib, but she was on 75 mg daily, 21 days on, 7 days off. She skipped the last cycle due to shingles.  She says her vesicular lesions have completely resolved and she has residual pain, but no other issues.    Jade Mooney also receives zolendronate every 12 weeks, next due in January.  She is tolerating this well.  Jade Mooney underwent PET scan on 11/13 and is here to further review those results.   REVIEW OF SYSTEMS: Jade Mooney is doing well today.  A detailed ROS is otherwise non contributory.     BREAST CANCER HISTORY: From the earlier summary notes:  Jade Mooney was referred to Dr. Clarene Essex for evaluation of abdominal discomfort and nausea. Exam and blood work including liver function tests was unremarkable aside from normochromic normocytic anemia. Cholecystitis was suspected and the patient underwent endoscopy followed by abdominal/ pelvic CT scan on 09/29/2013. This showed showed a normal gallbladder. Incidental findings included multiple simple cysts in the liver and aortic atherosclerosis. However mixed lytic and sclerotic bony lesions were noted. On 10/10/2013 the patient underwent biopsy of the right iliac bone, and this showed ([id number]) metastatic invasive ductal carcinoma, grade 2, estrogen and progesterone receptor strongly positive.  Jade Mooney has a remote  history of breast cancer, which we tried to reconstruct. She was originally diagnosed early in 1997 with stage III disease, and underwent right mastectomy followed by adjuvant chemotherapy. She then participated in a [facility] protocol with high-dose chemotherapy followed by stem cell rescue 12/22/1995. Unfortunately later that year liver lesions were noted and were biopsy-proven to be metastatic breast cancer. This was not felt to be resectable. However the tumor was estrogen receptor positive and HER-2 positive. The patient was treated with Herceptin (she does not know for what period of time) and was then on tamoxifen until November of 2002. She also participated in a vaccine study at [facility].  Louie's subsequent history is as detailed below   PAST MEDICAL HISTORY: Past Medical History:  Diagnosis Date  . Anxiety   . Cancer [facility])    Breast 1997 right tx with mastectomy and chemo, metastatic now  . GERD (gastroesophageal reflux disease)   . Hypercholesterolemia   . Hypertension   . Hypothyroidism   . Osteoarthritis    oa  . TIA (transient ischemic attack) last 72-17-72   x 2 total    PAST SURGICAL HISTORY: Past Surgical History:  Procedure Laterality Date  . CATARACT EXTRACTION, BILATERAL    . LEFT TOTAL HIP ARTHROPLASTY ANTERIOR APPROACH Left 03/12/2016   Performed by Gaynelle Arabian, MD at [facility] ORS  . MASTECTOMY MODIFIED RADICAL Right 1997  . porta cath insertion  1997   later removed  . radiation tx  finished 02-25-16   x 20 tx  . TONSILLECTOMY    . TOTAL KNEE ARTHROPLASTY Left   . TUBAL LIGATION      FAMILY HISTORY No family history on  file. The patient's father died at the age of 105 from heart disease. The patient's mother died at the age of 2. The patient had no brothers, one sister. One of the patient's mother's 5 sisters was diagnosed with breast cancer in her 71s  GYNECOLOGIC HISTORY:  No LMP recorded. Patient is postmenopausal. Menarche age 27, the patient is GX P0. She  stopped having periods approximately 1994. She used hormone replacement until 1997. 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 Her husband [name] (goes by "[name]" was [employer]. of information all services at [employer]. They live alone, with no pets.    ADVANCED DIRECTIVES: In place   HEALTH MAINTENANCE: Social History   Tobacco Use  . Smoking status: Never Smoker  . Smokeless tobacco: Never Used  Substance Use Topics  . Alcohol use: Yes    Comment: 1 glass wine 4-5 x week  . Drug use: No     Colonoscopy: 2013  PAP: 2010  Bone density: 2012  Lipid panel:  Allergies  Allergen Reactions  . Amoxicillin Hives    Has patient had a PCN reaction causing immediate rash, facial/tongue/throat swelling, SOB or lightheadedness with hypotension:unsure Has patient had a PCN reaction causing severe rash involving mucus membranes or skin necrosis:No Has patient had a PCN reaction that required hospitalization:No Has patient had a PCN reaction occurring within the last 10 years: Yes If all of the above answers are "NO", then may proceed with Cephalosporin use.   Marland Kitchen Percocet [Oxycodone-Acetaminophen] Nausea And Vomiting  . Percodan [Oxycodone-Aspirin] Nausea And Vomiting  . Tramadol Itching    Current Outpatient Medications  Medication Sig Dispense Refill  . acetaminophen (TYLENOL) 500 MG tablet Take 1,000 mg by mouth every 6 (six) hours as needed for mild pain. Reported on 04/17/2015    . gabapentin (NEURONTIN) 300 MG capsule TAKE 1 CAPSULE BY MOUTH THREE TIMES A DAY  3  . letrozole (FEMARA) 2.5 MG tablet Take 1 tablet (2.5 mg total) by mouth daily. 90 tablet 4  . levothyroxine (SYNTHROID, LEVOTHROID) 88 MCG tablet Take 88 mcg by mouth daily before breakfast.    . losartan (COZAAR) 25 MG tablet Take 25 mg daily by mouth.     . Lutein 6 MG TABS Take 6 mg by mouth 2 (two) times daily.       . magnesium gluconate (MAGONATE) 500 MG tablet Take 500 mg by mouth at bedtime.    . meclizine (ANTIVERT) 25 MG tablet Take 25 mg by mouth 3 (three) times daily as needed for dizziness.    Marland Kitchen METRONIDAZOLE, TOPICAL, 0.75 % LOTN Apply 1 application topically at bedtime. Apply to face for rosacea    . palbociclib (IBRANCE) 75 MG capsule Take 1 capsule (75 mg total) by mouth daily with breakfast. Take whole with food on days 1-21, every 28 days. 21 capsule 2  . pantoprazole (PROTONIX) 40 MG tablet Take 40 mg by mouth 2 (two) times daily. Before breakfast and before supper    . Polyethyl Glycol-Propyl Glycol (SYSTANE ULTRA) 0.4-0.3 % SOLN Place 1-2 drops into both eyes 2 (two) times daily.    . polyethylene glycol (MIRALAX / GLYCOLAX) packet Take 4 g by mouth daily. 1 teaspoonful in the morning    . rOPINIRole (REQUIP) 0.5 MG tablet Take 0.5 mg by mouth 3 (three) times daily.    . simvastatin (ZOCOR) 40 MG tablet Take 40  mg by mouth at bedtime.     . traZODone (DESYREL) 50 MG tablet Take 25 mg by mouth at bedtime.      No current facility-administered medications for this visit.     OBJECTIVE:   Vitals:   02/09/17 1306  BP: (!) 155/75  Pulse: 62  Resp: 20  Temp: 97.8 F (36.6 C)  SpO2: 100%     Body mass index is 28.51 kg/m.    ECOG FS:1 - Symptomatic but completely ambulatory  GENERAL: Patient is a well appearing female in no acute distress HEENT:  Sclerae anicteric.  Oropharynx clear and moist. No ulcerations or evidence of oropharyngeal candidiasis. Neck is supple.  NODES:  No cervical, supraclavicular, or axillary lymphadenopathy palpated.  BREAST EXAM:  Deferred. LUNGS:  Clear to auscultation bilaterally.  No wheezes or rhonchi. HEART:  Regular rate and rhythm. No murmur appreciated. ABDOMEN:  Soft, nontender.  Positive, normoactive bowel sounds. No organomegaly palpated. MSK:  No focal spinal tenderness to palpation. Full range of motion bilaterally in the upper  extremities. EXTREMITIES:  No peripheral edema.   SKIN:  Clear with no obvious rashes or skin changes. Shingles lesions on lower abdomen and groin, are healed, can only see mild scarring from where they were previously located.  No nail dyscrasia. NEURO:  Nonfocal. Well oriented.  Appropriate affect.   LAB RESULTS: CMP     Component Value Date/Time   NA 138 02/09/2017 1213   K 3.9 02/09/2017 1213   CL 107 03/14/2016 0412   CO2 27 02/09/2017 1213   GLUCOSE 100 02/09/2017 1213   BUN 12.0 02/09/2017 1213   CREATININE 0.9 02/09/2017 1213   CALCIUM 8.7 02/09/2017 1213   PROT 6.5 02/09/2017 1213   ALBUMIN 4.1 02/09/2017 1213   AST 24 02/09/2017 1213   ALT 18 02/09/2017 1213   ALKPHOS 62 02/09/2017 1213   BILITOT 0.34 02/09/2017 1213   GFRNONAA >60 03/14/2016 0412   GFRAA >60 03/14/2016 0412    I No results found for: SPEP  Lab Results  Component Value Date   WBC 4.5 02/09/2017   NEUTROABS 3.0 02/09/2017   HGB 12.5 02/09/2017   HCT 38.5 02/09/2017   MCV 94.8 02/09/2017   PLT 193 02/09/2017      Chemistry      Component Value Date/Time   NA 138 02/09/2017 1213   K 3.9 02/09/2017 1213   CL 107 03/14/2016 0412   CO2 27 02/09/2017 1213   BUN 12.0 02/09/2017 1213   CREATININE 0.9 02/09/2017 1213      Component Value Date/Time   CALCIUM 8.7 02/09/2017 1213   ALKPHOS 62 02/09/2017 1213   AST 24 02/09/2017 1213   ALT 18 02/09/2017 1213   BILITOT 0.34 02/09/2017 1213      No results for input(s): INR in the last 168 hours.  Urinalysis    Component Value Date/Time   COLORURINE STRAW (A) 03/05/2016 1350    STUDIES: Nm Pet Image Restag (ps) Skull Base To Thigh  Result Date: 02/03/2017 CLINICAL DATA:  Subsequent treatment strategy for breast cancer. EXAM: NUCLEAR MEDICINE PET SKULL BASE TO THIGH TECHNIQUE: 7.99 mCi F-18 FDG was injected intravenously. Full-ring PET imaging was performed from the skull base to thigh after the radiotracer. CT data was obtained and  used for attenuation correction and anatomic localization. FASTING BLOOD GLUCOSE:  Value: 109 mg/dl COMPARISON:  04/04/2016 FINDINGS: NECK: No hypermetabolic lymph nodes in the neck. CHEST: No hypermetabolic mediastinal or hilar nodes. Small subpleural nodule  within the periphery of the right middle lobe measures 5 mm and is too small to characterize by PET-CT. Not identified on the comparison exam. ABDOMEN/PELVIS: No abnormal hypermetabolic activity within the liver, pancreas, adrenal glands, or spleen. Gallstone identified. No hypermetabolic lymph nodes in the abdomen or pelvis. SKELETON: There is a soft tissue attenuating nodule within the left anterior chest wall with an SUV max equal to 1.74. Unchanged from previous exam. Status post right mastectomy. Widespread sclerotic bone metastases are again noted. The hypermetabolic lesion within the T8 vertebra has an SUV max equal to 3.91. This is compared with 4.71 previously. Hypermetabolic lesion within the manubrium has an SUV max equal to 2.5. Previously 3.8. New focus of mildly increased radiotracer activity localizes to the T6 vertebra has an SUV max equal to 4.0. There is a new focus of increased uptake localizing to the right posterior elements of L5. SUV max equals to 7.1. Increased uptake localizing to the right posterior elements of T11 has an SUV max equal to 4.1. New from previous exam. Within the left posterior elements of the C4 vertebra there is a new focus of increased uptake within SUV max equal to 4.0. IMPRESSION: 1. Widespread sclerotic metastasis. Previously noted foci of increased radiotracer uptake localizing to the manubrium and lower thoracic spine have improved in the interval. There are several new foci of increased radiotracer uptake localizing to the cervical, thoracic and lumbar spine suggesting metabolically active bone metastases. 2. Small pulmonary nodule in the right middle lobe is new from previous exam. Nonspecific in too small to  reliably characterize by PET-CT measuring 5 mm. Electronically Signed   By: Kerby Moors M.D.   On: 02/03/2017 14:11     ASSESSMENT: 71 y.o. [place] woman with stage IV right breast cancer (bone metastatic disease, subcutaneous nodules)  (1) s/p Right mastectomy 1997 for an estrogen receptor and HER-2 positive breast cancer, treated with  (a) adjuvant chemotherapy according to CALGB 9640 (taxotere x 4 cycles)  (b) high-dose chemotherapy (cyclophosphamide, carboplatin, BCNU) followed by stem cell rescue at [place] 12/22/1995  (c) biopsy-proven metastatic disease November 2007, estrogen receptor and HER-2 positive  (d) trastuzumab (dose? Duration?)  (e) tamoxifen for 5 years completed 2002  (f) participation in a vaccine study at [facility] 2003 (CEA primed dendritic cells, HER-2 primed dendritic cells)   (2) status post right iliac crest biopsy 10/10/2013 for invasive ductal carcinoma, grade 2, estrogen and progesterone receptor positive  (3) zolendronate started 10/12/2013,  repeated every 12 weeks, next dose due April 2018  (a) dexa scan 07/28/2013 normal  (4) biopsy of a scalp lesion 10/17/2013 showed metastatic breast cancer, HER-2/neu negative  (a) biopsy of the right anterior chest wall nodule August 2017 shows adenocarcinoma  (5) anastrozole started 10/28/2013, changed to letrozole 06/27/2016  (a) measurable disease = subcutaneous nodules in scalp and LLQ abdomen  (b)  CA-27-29 is informative  (c)  Repeat PET scan 12/28/2014 shows continuing response ; exam shows resolution of scalp lesions  (d) new skin lesion removed from right anterior chest wall August 2017  (e) status post radiation to the anterior lower right chest wall completed 02/25/2017 (20 sessions)   (f) PET scan 04/04/2016 shows 2 new foci of metabolic activity (T8 and manubrium)  (g) palbociclib added 04/21/2016 at 125 mg 21/7  (h) palbociclib dose dropped to 100 mg 21/7 starting with the March 2018 cycle  (i) bone scan  09/26/2016 is stable    (6) neoplasia associated pain  (a) painful blastic  lesion left femoral intertrochanteric region: Status post radiation completed November 2015  (b) pain left knee, status post TKR remotely  (c) left total hip replacement 03/12/2016  PLAN:  Jade Mooney is doing well today.  Her CBC is normal and I reviewed this with her and gave her a copy of the results.  She will restart the Palbociclib.  She did have some areas on her PET scan that were concerning for progression in the bony mets in the spine.  She met with Jade Mooney today who told her it was likely due to having to stop the Palbociclib.  She will restart the palbociclib at 75 mg three weeks on, one week off, and we will recheck a PET scan in 3 months time.  She verbalized understanding of this plan.    [name] will return in 2 months for labs, f/u with Jade Mooney and Zolendronate.  At that appointment, he will order the PET scan for February.    She knows to call for any problems that may develop before her next visit here.  Jade Mooney came into the appointment and formulated the above plan and reviewed it in detail with the patient.    Wilber Bihari, NP  02/09/17 1:21 PM Medical Oncology and Hematology [facility] [address] [place], [place] [address] Tel. [phone]    Fax. [phone]   ADDENDUM: Milagro had evidence of disease progression after 2 months of palbociclib.  This came after a long period of stable disease.  It is remarkable I think and it does indicate a prior treatment was being effective at controlling the tumor.  Accordingly we are going back to palbociclib.  I am confident that when we repeat a bone scan in approximately 3 months we will see no significant change from the one just obtained.  Otherwise of course we will have to change her treatment to something more aggressive.  She has a good understanding of the issues involved and is very much in agreement with  this plan.  I personally saw this patient and performed a substantive portion of this encounter with the listed APP documented above.   Chauncey Cruel, MD Medical Oncology and Hematology [facility] [address] [place],  [address] Tel. [phone]    Fax. [phone]

## 2017-02-09 NOTE — Telephone Encounter (Signed)
Gave avs and calendar for January 2019 °

## 2017-02-10 LAB — CANCER ANTIGEN 27.29: CA 27.29: 68.1 U/mL — ABNORMAL HIGH (ref 0.0–38.6)

## 2017-02-18 DIAGNOSIS — M25519 Pain in unspecified shoulder: Secondary | ICD-10-CM | POA: Diagnosis not present

## 2017-02-18 DIAGNOSIS — C50919 Malignant neoplasm of unspecified site of unspecified female breast: Secondary | ICD-10-CM | POA: Diagnosis not present

## 2017-02-18 DIAGNOSIS — D492 Neoplasm of unspecified behavior of bone, soft tissue, and skin: Secondary | ICD-10-CM | POA: Diagnosis not present

## 2017-02-18 DIAGNOSIS — Z6829 Body mass index (BMI) 29.0-29.9, adult: Secondary | ICD-10-CM | POA: Diagnosis not present

## 2017-02-19 ENCOUNTER — Other Ambulatory Visit: Payer: Self-pay | Admitting: Oncology

## 2017-02-19 NOTE — Progress Notes (Unsigned)
I was called by Dr. Nicki Reaper who saw [facility] yesterday, 02/18/2017.  He tells me she was having more neck pain, involving the left neck and left trapezius area.  He reviewed our PET scan which suggested a lesion there.  He obtained plain films which however only showed a spondylolisthesis between C3 and C4.  He is obtaining an MRI and if there is a neurologic issue he would like her to see neurosurgery here, which we will be very glad to arrange for.

## 2017-02-20 ENCOUNTER — Telehealth: Payer: Self-pay | Admitting: Pharmacist

## 2017-02-20 ENCOUNTER — Other Ambulatory Visit: Payer: Self-pay | Admitting: Family Medicine

## 2017-02-20 DIAGNOSIS — D499 Neoplasm of unspecified behavior of unspecified site: Secondary | ICD-10-CM

## 2017-02-20 NOTE — Telephone Encounter (Signed)
Oral Chemotherapy Pharmacist Encounter  Spoke with patient today. She has restarted Jade Mooney and is doing great. Denies any remaining skin irritation.  Dispensed samples to patient:  Medication: Ibrance 75mg  capsules Instructions: Take 1 capsule by mouth once daily with breakfast, take for 21 days on, 7 days off, repeat every 28 days. Quantity dispensed: 42 Days supply: 56 Manufacturer: [employer] Lot: [id number] Exp: 05/2018  Patient and husband will come by [facility] on 02/23/17 to pick up Ibrance supply. Jade Mooney to call the office with any questions or concerns.  Oral Oncology Clinic will continue to follow.  Johny Drilling, PharmD, BCPS, BCOP 02/20/2017 1:16 PM Oral Oncology Clinic [phone]

## 2017-03-03 ENCOUNTER — Other Ambulatory Visit: Payer: Medicare Other

## 2017-03-04 DIAGNOSIS — Z23 Encounter for immunization: Secondary | ICD-10-CM | POA: Diagnosis not present

## 2017-03-04 DIAGNOSIS — D510 Vitamin B12 deficiency anemia due to intrinsic factor deficiency: Secondary | ICD-10-CM | POA: Diagnosis not present

## 2017-03-06 ENCOUNTER — Ambulatory Visit
Admission: RE | Admit: 2017-03-06 | Discharge: 2017-03-06 | Disposition: A | Discharge status: Home / Self Care | Payer: Medicare Other | Source: Ambulatory Visit | Attending: Family Medicine | Admitting: Family Medicine

## 2017-03-06 DIAGNOSIS — Z1231 Encounter for screening mammogram for malignant neoplasm of breast: Secondary | ICD-10-CM | POA: Diagnosis not present

## 2017-03-06 HISTORY — DX: Personal history of irradiation: Z92.3

## 2017-03-06 HISTORY — DX: Personal history of antineoplastic chemotherapy: Z92.21

## 2017-03-09 ENCOUNTER — Ambulatory Visit
Admission: RE | Admit: 2017-03-09 | Discharge: 2017-03-09 | Disposition: A | Discharge status: Home / Self Care | Payer: Medicare Other | Source: Ambulatory Visit | Attending: Family Medicine | Admitting: Family Medicine

## 2017-03-09 DIAGNOSIS — M4802 Spinal stenosis, cervical region: Secondary | ICD-10-CM | POA: Diagnosis not present

## 2017-03-09 DIAGNOSIS — D510 Vitamin B12 deficiency anemia due to intrinsic factor deficiency: Secondary | ICD-10-CM | POA: Diagnosis not present

## 2017-03-09 DIAGNOSIS — D499 Neoplasm of unspecified behavior of unspecified site: Secondary | ICD-10-CM

## 2017-04-01 DIAGNOSIS — C7951 Secondary malignant neoplasm of bone: Secondary | ICD-10-CM | POA: Diagnosis not present

## 2017-04-01 DIAGNOSIS — C50919 Malignant neoplasm of unspecified site of unspecified female breast: Secondary | ICD-10-CM | POA: Diagnosis not present

## 2017-04-02 ENCOUNTER — Other Ambulatory Visit: Payer: Self-pay | Admitting: Oncology

## 2017-04-02 DIAGNOSIS — C419 Malignant neoplasm of bone and articular cartilage, unspecified: Secondary | ICD-10-CM | POA: Diagnosis not present

## 2017-04-02 NOTE — Progress Notes (Signed)
[place]  [phone] Fax:([phone]) [phone]   ID: Jade Mooney DOB: 23-Jan-1946  MR#: [id number]  TKW#:[id number]  PCP: Jade Peer, MD GYN: Jade Pigg MD SU:  OTHER MD: Jade Essex MD, Jade Mooney M.D., Jade Mayer MD  CHIEF COMPLAINT: Newly diagnosed metastatic breast cancer  CURRENT TREATMENT: Zolendronate, letrozole, palbociclib  INTERVAL HISTORY: Jade Mooney returns today for follow-up and treatment of her estrogen receptor positive breast cancer accompanied by her husband. She continues on letrozole, with good tolerance. She notes that her hair is thinning more. She denies having hot flashes and vaginal dryness.   She continues on palbociclib 100 mg 21 days on and 7 days off, with good tolerance. She denies fatigue, nausea, or vomiting.  She also receives zolendronate every 3 months, with a dose due today. She denies any current side effects.  Since her last visit, she underwent routine unilateral left  breast mammography with CAD and tomography on 03/06/2017 at [facility] showing: breast density category C. There was no evidence of malignancy.   She also completed a MRI of the cervical spine on 03/09/2017 showing: There is collapse of the left lateral mass of C3 which correlates with the abnormal area of sclerosis and increased activity on PET-CT scan of 02/03/2017. There is adjacent left foraminal stenosis. No discrete soft tissue mass. This probably represents a metastatic disease. Multiple sclerotic metastases in the skull and cervical spine. Left foraminal stenosis at C5-6.  Based on this and given that she is having significant pain in that area she met with Dr. Kristeen Mooney.  I do not have a copy of his note but according to Jade Mooney the patient's radiation oncologist in [place], he was comfortable with radiation now, with surgery down the road if necessary.  She will be starting her radiation treatments in the next week.   REVIEW  OF SYSTEMS: Jade Mooney reports that she and her husband went to [place] to visit family for the holidays. She notes that her left shoulder started tingling and extended into her neck and left chest. She notes that she saw Jade Mooney who order her MRI. She notes that her stools have been more loose recently. She denies unusual headaches, visual changes, nausea, vomiting, or dizziness. There has been no unusual cough, phlegm production, or pleurisy. This been no change in bowel or bladder habits. She denies unexplained fatigue or unexplained weight loss, bleeding, rash, or fever. A detailed review of systems was otherwise stable.    BREAST CANCER HISTORY: From the earlier summary notes:  Jade Mooney was referred to Dr. Clarene Mooney for evaluation of abdominal discomfort and nausea. Exam and blood work including liver function tests was unremarkable aside from normochromic normocytic anemia. Cholecystitis was suspected and the patient underwent endoscopy followed by abdominal/ pelvic CT scan on 09/29/2013. This showed showed a normal gallbladder. Incidental findings included multiple simple cysts in the liver and aortic atherosclerosis. However mixed lytic and sclerotic bony lesions were noted. On 10/10/2013 the patient underwent biopsy of the right iliac bone, and this showed ([id number]) metastatic invasive ductal carcinoma, grade 2, estrogen and progesterone receptor strongly positive.  Jade Mooney has a remote history of breast cancer, which we tried to reconstruct. She was originally diagnosed early in 1997 with stage III disease, and underwent right mastectomy followed by adjuvant chemotherapy. She then participated in a [facility] protocol with high-dose chemotherapy followed by stem cell rescue 12/22/1995. Unfortunately later that year liver lesions were noted and were biopsy-proven to be  metastatic breast cancer. This was not felt to be resectable. However the tumor was estrogen receptor positive and HER-2 positive. The  patient was treated with Herceptin (she does not know for what period of time) and was then on tamoxifen until November of 2002. She also participated in a vaccine study at [facility].  Jade Mooney's subsequent history is as detailed below   PAST MEDICAL HISTORY: Past Medical History:  Diagnosis Date  . Anxiety   . Cancer [facility])    Breast 1997 right tx with mastectomy and chemo, metastatic now  . GERD (gastroesophageal reflux disease)   . Hypercholesterolemia   . Hypertension   . Hypothyroidism   . Osteoarthritis    oa  . Personal history of chemotherapy   . Personal history of radiation therapy   . TIA (transient ischemic attack) last 11-08-15   x 2 total    PAST SURGICAL HISTORY: Past Surgical History:  Procedure Laterality Date  . CATARACT EXTRACTION, BILATERAL    . MASTECTOMY MODIFIED RADICAL Right 1997  . porta cath insertion  1997   later removed  . radiation tx  finished 02-25-16   x 20 tx  . TONSILLECTOMY    . TOTAL HIP ARTHROPLASTY Left 03/12/2016   Procedure: LEFT TOTAL HIP ARTHROPLASTY ANTERIOR APPROACH;  Surgeon: Gaynelle Arabian, MD;  Location: [facility];  Service: Orthopedics;  Laterality: Left;  . TOTAL KNEE ARTHROPLASTY Left   . TUBAL LIGATION      FAMILY HISTORY Family History  Problem Relation Age of Onset  . Breast cancer Maternal Aunt    The patient's father died at the age of 44 from heart disease. The patient's mother died at the age of 95. The patient had no brothers, one sister. One of the patient's mother's 5 sisters was diagnosed with breast cancer in her 26s  GYNECOLOGIC HISTORY:  No LMP recorded. Patient is postmenopausal. Menarche age 28, the patient is GX P0. She stopped having periods approximately 1994. She used hormone replacement until 1997. 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 Her husband Jade Mooney (goes by "Jade Mooney" was [employer]. of  information all services at [employer]. They live alone, with no pets.    ADVANCED DIRECTIVES: In place   HEALTH MAINTENANCE: Social History   Tobacco Use  . Smoking status: Never Smoker  . Smokeless tobacco: Never Used  Substance Use Topics  . Alcohol use: Yes    Comment: 1 glass wine 4-5 x week  . Drug use: No     Colonoscopy: 2013  PAP: 2010  Bone density: 2012  Lipid panel:  Allergies  Allergen Reactions  . Amoxicillin Hives    Has patient had a PCN reaction causing immediate rash, facial/tongue/throat swelling, SOB or lightheadedness with hypotension:unsure Has patient had a PCN reaction causing severe rash involving mucus membranes or skin necrosis:No Has patient had a PCN reaction that required hospitalization:No Has patient had a PCN reaction occurring within the last 10 years: Yes If all of the above answers are "NO", then may proceed with Cephalosporin use.   Marland Kitchen Percocet [Oxycodone-Acetaminophen] Nausea And Vomiting  . Percodan [Oxycodone-Aspirin] Nausea And Vomiting  . Tramadol Itching    Current Outpatient Medications  Medication Sig Dispense Refill  . acetaminophen (TYLENOL) 500 MG tablet Take 1,000 mg by mouth every 6 (six) hours as needed for mild pain. Reported on 04/17/2015    . gabapentin (NEURONTIN) 300 MG  capsule TAKE 1 CAPSULE BY MOUTH THREE TIMES A DAY  3  . letrozole (FEMARA) 2.5 MG tablet Take 1 tablet (2.5 mg total) by mouth daily. 90 tablet 4  . levothyroxine (SYNTHROID, LEVOTHROID) 88 MCG tablet Take 88 mcg by mouth daily before breakfast.    . Lutein 6 MG TABS Take 6 mg by mouth 2 (two) times daily.     . magnesium gluconate (MAGONATE) 500 MG tablet Take 500 mg by mouth at bedtime.    . meclizine (ANTIVERT) 25 MG tablet Take 25 mg by mouth 3 (three) times daily as needed for dizziness.    Marland Kitchen METRONIDAZOLE, TOPICAL, 0.75 % LOTN Apply 1 application topically at bedtime. Apply to face for rosacea    . palbociclib (IBRANCE) 75 MG capsule Take 1 capsule  (75 mg total) by mouth daily with breakfast. Take whole with food on days 1-21, every 28 days. 21 capsule 2  . pantoprazole (PROTONIX) 40 MG tablet Take 40 mg by mouth 2 (two) times daily. Before breakfast and before supper    . Polyethyl Glycol-Propyl Glycol (SYSTANE ULTRA) 0.4-0.3 % SOLN Place 1-2 drops into both eyes 2 (two) times daily.    . polyethylene glycol (MIRALAX / GLYCOLAX) packet Take 4 g by mouth daily. 1 teaspoonful in the morning    . rOPINIRole (REQUIP) 0.5 MG tablet Take 0.5 mg by mouth 3 (three) times daily.    . simvastatin (ZOCOR) 40 MG tablet Take 40 mg by mouth at bedtime.     . traZODone (DESYREL) 50 MG tablet Take 25 mg by mouth at bedtime.      No current facility-administered medications for this visit.     OBJECTIVE: Middle-aged white woman who appears stated age  24:   04/06/17 0928  BP: (!) 152/74  Pulse: 66  Resp: 16  Temp: 98.3 F (36.8 C)  SpO2: 99%     Body mass index is 28.89 kg/m.    ECOG FS:1 - Symptomatic but completely ambulatory  Sclerae unicteric, EOMs intact Oropharynx clear and moist No cervical or supraclavicular adenopathy, fair range of motion Lungs no rales or rhonchi Heart regular rate and rhythm Abd soft, nontender, positive bowel sounds MSK no focal spinal tenderness, no upper extremity lymphedema Neuro: nonfocal, well oriented, appropriate affect Breasts: The right breast is status post mastectomy.  There is no evidence of chest wall recurrence.  The left breast is unremarkable.  Both axilla are benign.   LAB RESULTS: CMP     Component Value Date/Time   NA 138 02/09/2017 1213   K 3.9 02/09/2017 1213   CL 107 03/14/2016 0412   CO2 27 02/09/2017 1213   GLUCOSE 100 02/09/2017 1213   BUN 12.0 02/09/2017 1213   CREATININE 0.9 02/09/2017 1213   CALCIUM 8.7 02/09/2017 1213   PROT 6.5 02/09/2017 1213   ALBUMIN 4.1 02/09/2017 1213   AST 24 02/09/2017 1213   ALT 18 02/09/2017 1213   ALKPHOS 62 02/09/2017 1213   BILITOT  0.34 02/09/2017 1213   GFRNONAA >60 03/14/2016 0412   GFRAA >60 03/14/2016 0412    I No results found for: SPEP  Lab Results  Component Value Date   WBC 2.5 (L) 04/06/2017   NEUTROABS 1.2 (L) 04/06/2017   HGB 12.7 04/06/2017   HCT 38.3 04/06/2017   MCV 91.1 04/06/2017   PLT 179 04/06/2017      Chemistry      Component Value Date/Time   NA 138 02/09/2017 1213   K 3.9  02/09/2017 1213   CL 107 03/14/2016 0412   CO2 27 02/09/2017 1213   BUN 12.0 02/09/2017 1213   CREATININE 0.9 02/09/2017 1213      Component Value Date/Time   CALCIUM 8.7 02/09/2017 1213   ALKPHOS 62 02/09/2017 1213   AST 24 02/09/2017 1213   ALT 18 02/09/2017 1213   BILITOT 0.34 02/09/2017 1213      No results for input(s): INR in the last 168 hours.  Urinalysis    Component Value Date/Time   COLORURINE STRAW (A) 03/05/2016 1350    STUDIES: Mr Cervical Spine Wo Contrast  Result Date: 03/09/2017 CLINICAL DATA:  Neck pain.  Metastatic breast cancer to bone. EXAM: MRI CERVICAL SPINE WITHOUT CONTRAST TECHNIQUE: Multiplanar, multisequence MR imaging of the cervical spine was performed. No intravenous contrast was administered. COMPARISON:  PET-CT dated 02/03/2017 and bone scan dated 09/26/2016 FINDINGS: Alignment: 2 mm anterolisthesis of C2 on C3 and 2 mm anterolisthesis of C7 on T1. Vertebrae: There is collapse of the left lateral mass of C3 which correlates with the area of abnormal activity on PET scan. No soft tissue mass or focal neural impingement. There are multiple sclerotic areas in the spine and in the skull consistent with sclerotic treated metastases, most prominent in C7 and in the spinous process of T3. Cord: Normal signal and morphology. Posterior Fossa, vertebral arteries, paraspinal tissues: No acute abnormalities. No visible adenopathy. Disc levels: C2-3: Tiny central disc bulge without neural impingement. Widely patent neural foramina. Collapse of the left lateral mass of C3 consistent with  metastatic disease but without soft tissue mass. No neural impingement. C3-4: 2 mm anterolisthesis with a small broad-based disc bulge slightly asymmetric to the left with moderate left foraminal stenosis and collapse of the left lateral mass of C3. Slight arthritis of the right facet joint. C4-5: Disc degeneration with a small broad-based disc bulge without neural impingement. Widely patent neural foramina. C5-6: Disc space narrowing. Small broad-based disc osteophyte complex asymmetric to the left with moderate left foraminal stenosis. C6-7: Disc space narrowing. Tiny broad-based endplate osteophytes without neural impingement. Widely patent neural foramina. Sclerotic metastasis in the T7 vertebral body. C7-T1: 2 mm anterolisthesis. Tiny central disc bulge without neural impingement. T1-2:  Moderate bilateral facet arthritis, left greater than right. IMPRESSION: 1. There is collapse of the left lateral mass of C3 which correlates with the abnormal area of sclerosis and increased activity on PET-CT scan of 02/03/2017. There is adjacent left foraminal stenosis. No discrete soft tissue mass. This probably represents a metastatic disease. 2. Multiple sclerotic metastases in the skull and cervical spine as described above. 3. Left foraminal stenosis at C5-6. Electronically Signed   By: Lorriane Shire M.D.   On: 03/09/2017 16:10     ASSESSMENT: 72 y.o. [place] woman with stage IV right breast cancer (bone metastatic disease, subcutaneous nodules)  (1) s/p Right mastectomy 1997 for an estrogen receptor and HER-2 positive breast cancer, treated with  (a) adjuvant chemotherapy according to CALGB 9640 (taxotere x 4 cycles)  (b) high-dose chemotherapy (cyclophosphamide, carboplatin, BCNU) followed by stem cell rescue at [facility] 12/22/1995  (c) biopsy-proven metastatic disease November 2007, estrogen receptor and HER-2 positive  (d) trastuzumab (dose? Duration?)  (e) tamoxifen for 5 years completed 2002  (f)  participation in a vaccine study at [facility] 2003 (CEA primed dendritic cells, HER-2 primed dendritic cells)   (2) status post right iliac crest biopsy 10/10/2013 for invasive ductal carcinoma, grade 2, estrogen and progesterone receptor positive  (3) zolendronate  started 10/12/2013,  repeated every 12 weeks  (a) dexa scan 07/28/2013 normal  (4) biopsy of a scalp lesion 10/17/2013 showed metastatic breast cancer, HER-2/neu negative  (a) biopsy of the right anterior chest wall nodule August 2017 shows adenocarcinoma  (5) anastrozole started 10/28/2013, changed to letrozole 06/27/2016  (a) measurable disease = subcutaneous nodules in scalp and LLQ abdomen  (b)  CA-27-29 is informative  (c)  Repeat PET scan 12/28/2014 shows continuing response ; exam shows resolution of scalp lesions  (d) new skin lesion removed from right anterior chest wall August 2017  (e) status post radiation to the anterior lower right chest wall completed 02/25/2017 (20 sessions)   (f) PET scan 04/04/2016 shows 2 new foci of metabolic activity (T8 and manubrium)  (g) palbociclib added 04/21/2016 at 125 mg 21/7  (h) palbociclib dose dropped to 100 mg 21/7 starting with the March 2018 cycle  (i) bone scan 09/26/2016 is stable  (j) PET scan 02/03/2017 shows her bone metastases, and in addition a 0.5 cm right middle lobe nodule which may be new  (k) MRI of the cervical spine shows collapse of the left lateral mass of C3.  There is no soft tissue mass or focal neural impingement.  (l) Ibrance reduced to 75 mg as of September 2018    (6) neoplasia associated pain  (a) painful blastic lesion left femoral intertrochanteric region: Status post radiation completed November 2015  (b) pain left knee, status post TKR remotely  (c) left total hip replacement 03/12/2016  PLAN:  Audrena is now 22 years out from initial diagnosis of her breast cancer and 3-1/2 years out from diagnosis of metastatic disease.  In general she is stable,  but she is having more problems related to the neck lesion and the plan will be for her to proceed to radiation to that area with the possibility of surgery later on.  Since she is going to be receiving radiation and her [place] right now is 1.2 were going to hold the [place] this month.  We will likely resume it at the current dose, 75 mg, at the next visit.  He will continue on letrozole.  She will also receives all in  It is very difficult to know when the patient with bone only disease or bone primarily disease is progressing,.  Her CA-27-29 has been stable.  There is not repeat pending today.  She is considering repeating colonoscopy this year.  I personally think it is a good idea.  I would like to see her in 4 weeks.  Very likely at that time we will switch her letrozole to fulvestrant.  We will probably change her zolendronate to Del[place] See also at that time.  We would then repeat a PET scan perhaps in May  She knows to call for any other issues that may develop before then. Magrinat, Virgie Dad, MD  04/06/17 9:58 AM Medical Oncology and Hematology [facility] [address] [place], [place] [address] Tel. [phone]    Fax. [phone]    ADDENDUM: [place] had evidence of disease progression after 2 months of palbociclib.  This came after a long period of stable disease.  It is remarkable I think and it does indicate a prior treatment was being effective at controlling the tumor.  Accordingly we are going back to palbociclib.  I am confident that when we repeat a bone scan in approximately 3 months we will see no significant change from the one just obtained.  Otherwise of  course we will have to change her treatment to something more aggressive.  She has a good understanding of the issues involved and is very much in agreement with this plan.  I personally saw this patient and performed a substantive portion of this encounter with the listed APP documented above.     Magrinat, Virgie Dad, MD  04/06/17 9:58 AM Medical Oncology and Hematology [facility] [address] [place], [place] [address] Tel. [phone]    Fax. [phone]  This document serves as a record of services personally performed by Lurline Del, MD. It was created on his behalf by Sheron Nightingale, a trained medical scribe. The creation of this record is based on the scribe's personal observations and the provider's statements to them.   I have reviewed the above documentation for accuracy and completeness, and I agree with the above.

## 2017-04-03 DIAGNOSIS — D510 Vitamin B12 deficiency anemia due to intrinsic factor deficiency: Secondary | ICD-10-CM | POA: Diagnosis not present

## 2017-04-06 ENCOUNTER — Inpatient Hospital Stay: Payer: Medicare Other | Attending: Oncology | Admitting: Oncology

## 2017-04-06 ENCOUNTER — Inpatient Hospital Stay: Payer: Medicare Other

## 2017-04-06 ENCOUNTER — Telehealth: Payer: Self-pay | Admitting: Oncology

## 2017-04-06 VITALS — BP 152/74 | HR 66 | Temp 98.3°F | Resp 16 | Ht 63.5 in | Wt 165.7 lb

## 2017-04-06 DIAGNOSIS — C7951 Secondary malignant neoplasm of bone: Secondary | ICD-10-CM

## 2017-04-06 DIAGNOSIS — C50811 Malignant neoplasm of overlapping sites of right female breast: Secondary | ICD-10-CM | POA: Insufficient documentation

## 2017-04-06 DIAGNOSIS — G459 Transient cerebral ischemic attack, unspecified: Secondary | ICD-10-CM

## 2017-04-06 DIAGNOSIS — Z170 Estrogen receptor positive status [ER+]: Principal | ICD-10-CM

## 2017-04-06 DIAGNOSIS — C50911 Malignant neoplasm of unspecified site of right female breast: Secondary | ICD-10-CM

## 2017-04-06 DIAGNOSIS — L989 Disorder of the skin and subcutaneous tissue, unspecified: Secondary | ICD-10-CM

## 2017-04-06 DIAGNOSIS — C7989 Secondary malignant neoplasm of other specified sites: Secondary | ICD-10-CM | POA: Diagnosis not present

## 2017-04-06 DIAGNOSIS — C50912 Malignant neoplasm of unspecified site of left female breast: Secondary | ICD-10-CM

## 2017-04-06 DIAGNOSIS — C44501 Unspecified malignant neoplasm of skin of breast: Secondary | ICD-10-CM

## 2017-04-06 DIAGNOSIS — C50919 Malignant neoplasm of unspecified site of unspecified female breast: Secondary | ICD-10-CM

## 2017-04-06 DIAGNOSIS — G893 Neoplasm related pain (acute) (chronic): Secondary | ICD-10-CM

## 2017-04-06 LAB — CBC WITH DIFFERENTIAL/PLATELET
Basophils Absolute: 0.1 10*3/uL (ref 0.0–0.1)
Basophils Relative: 3 %
Eosinophils Absolute: 0.1 10*3/uL (ref 0.0–0.5)
Eosinophils Relative: 2 %
HCT: 38.3 % (ref 34.8–46.6)
Hemoglobin: 12.7 g/dL (ref 11.6–15.9)
Lymphocytes Relative: 30 %
Lymphs Abs: 0.7 10*3/uL — ABNORMAL LOW (ref 0.9–3.3)
MCH: 30.3 pg (ref 25.1–34.0)
MCHC: 33.3 g/dL (ref 31.5–36.0)
MCV: 91.1 fL (ref 79.5–101.0)
Monocytes Absolute: 0.4 10*3/uL (ref 0.1–0.9)
Monocytes Relative: 17 %
Neutro Abs: 1.2 10*3/uL — ABNORMAL LOW (ref 1.5–6.5)
Neutrophils Relative %: 48 %
Platelets: 179 10*3/uL (ref 145–400)
RBC: 4.2 MIL/uL (ref 3.70–5.45)
RDW: 16.7 % — ABNORMAL HIGH (ref 11.2–16.1)
WBC: 2.5 10*3/uL — ABNORMAL LOW (ref 3.9–10.3)

## 2017-04-06 LAB — COMPREHENSIVE METABOLIC PANEL
ALT: 12 U/L (ref 0–55)
AST: 18 U/L (ref 5–34)
Albumin: 4.3 g/dL (ref 3.5–5.0)
Alkaline Phosphatase: 60 U/L (ref 40–150)
Anion gap: 9 (ref 3–11)
BUN: 13 mg/dL (ref 7–26)
CO2: 26 mmol/L (ref 22–29)
Calcium: 9.1 mg/dL (ref 8.4–10.4)
Chloride: 104 mmol/L (ref 98–109)
Creatinine, Ser: 0.8 mg/dL (ref 0.60–1.10)
GFR calc Af Amer: >60 mL/min (ref >60)
GFR calc non Af Amer: >60 mL/min (ref >60)
Glucose, Bld: 73 mg/dL (ref 70–140)
Potassium: 3.9 mmol/L (ref 3.3–4.7)
Sodium: 139 mmol/L (ref 136–145)
Total Bilirubin: 0.4 mg/dL (ref 0.2–1.2)
Total Protein: 6.7 g/dL (ref 6.4–8.3)

## 2017-04-06 MED ORDER — ZOLEDRONIC ACID 4 MG/5ML IV CONC
4.0000 mg | Freq: Once | INTRAVENOUS | Status: AC
  Administered 2017-04-06: 4 mg via INTRAVENOUS
  Filled 2017-04-06: qty 5

## 2017-04-06 NOTE — Progress Notes (Signed)
Per desk RN Val for MD Magrinat ok to give zometa today with [place] of 1.2.

## 2017-04-06 NOTE — Patient Instructions (Signed)

## 2017-04-06 NOTE — Telephone Encounter (Signed)
Gave patient avs and calendar with appts per 1/14 los.  °

## 2017-04-06 NOTE — Addendum Note (Signed)
Addended by: Chauncey Cruel on: 04/06/2017 05:39 PM   Modules accepted: Orders

## 2017-04-07 DIAGNOSIS — Z510 Encounter for antineoplastic radiation therapy: Secondary | ICD-10-CM | POA: Diagnosis not present

## 2017-04-07 DIAGNOSIS — C7951 Secondary malignant neoplasm of bone: Secondary | ICD-10-CM | POA: Diagnosis not present

## 2017-04-07 DIAGNOSIS — C50919 Malignant neoplasm of unspecified site of unspecified female breast: Secondary | ICD-10-CM | POA: Diagnosis not present

## 2017-04-07 LAB — CANCER ANTIGEN 27.29: CA 27.29: 79.4 U/mL — ABNORMAL HIGH (ref 0.0–38.6)

## 2017-04-09 DIAGNOSIS — C7951 Secondary malignant neoplasm of bone: Secondary | ICD-10-CM | POA: Diagnosis not present

## 2017-04-10 DIAGNOSIS — Z510 Encounter for antineoplastic radiation therapy: Secondary | ICD-10-CM | POA: Diagnosis not present

## 2017-04-10 DIAGNOSIS — C50919 Malignant neoplasm of unspecified site of unspecified female breast: Secondary | ICD-10-CM | POA: Diagnosis not present

## 2017-04-10 DIAGNOSIS — C7951 Secondary malignant neoplasm of bone: Secondary | ICD-10-CM | POA: Diagnosis not present

## 2017-04-14 DIAGNOSIS — Z510 Encounter for antineoplastic radiation therapy: Secondary | ICD-10-CM | POA: Diagnosis not present

## 2017-04-14 DIAGNOSIS — C7951 Secondary malignant neoplasm of bone: Secondary | ICD-10-CM | POA: Diagnosis not present

## 2017-04-14 DIAGNOSIS — C50919 Malignant neoplasm of unspecified site of unspecified female breast: Secondary | ICD-10-CM | POA: Diagnosis not present

## 2017-04-15 DIAGNOSIS — C7951 Secondary malignant neoplasm of bone: Secondary | ICD-10-CM | POA: Diagnosis not present

## 2017-04-15 DIAGNOSIS — C50919 Malignant neoplasm of unspecified site of unspecified female breast: Secondary | ICD-10-CM | POA: Diagnosis not present

## 2017-04-15 DIAGNOSIS — Z510 Encounter for antineoplastic radiation therapy: Secondary | ICD-10-CM | POA: Diagnosis not present

## 2017-04-16 DIAGNOSIS — C7951 Secondary malignant neoplasm of bone: Secondary | ICD-10-CM | POA: Diagnosis not present

## 2017-04-16 DIAGNOSIS — Z510 Encounter for antineoplastic radiation therapy: Secondary | ICD-10-CM | POA: Diagnosis not present

## 2017-04-16 DIAGNOSIS — C50919 Malignant neoplasm of unspecified site of unspecified female breast: Secondary | ICD-10-CM | POA: Diagnosis not present

## 2017-04-17 DIAGNOSIS — Z510 Encounter for antineoplastic radiation therapy: Secondary | ICD-10-CM | POA: Diagnosis not present

## 2017-04-17 DIAGNOSIS — C50919 Malignant neoplasm of unspecified site of unspecified female breast: Secondary | ICD-10-CM | POA: Diagnosis not present

## 2017-04-17 DIAGNOSIS — C7951 Secondary malignant neoplasm of bone: Secondary | ICD-10-CM | POA: Diagnosis not present

## 2017-04-20 DIAGNOSIS — Z510 Encounter for antineoplastic radiation therapy: Secondary | ICD-10-CM | POA: Diagnosis not present

## 2017-04-20 DIAGNOSIS — C50919 Malignant neoplasm of unspecified site of unspecified female breast: Secondary | ICD-10-CM | POA: Diagnosis not present

## 2017-04-20 DIAGNOSIS — C7951 Secondary malignant neoplasm of bone: Secondary | ICD-10-CM | POA: Diagnosis not present

## 2017-04-21 DIAGNOSIS — Z510 Encounter for antineoplastic radiation therapy: Secondary | ICD-10-CM | POA: Diagnosis not present

## 2017-04-21 DIAGNOSIS — C50919 Malignant neoplasm of unspecified site of unspecified female breast: Secondary | ICD-10-CM | POA: Diagnosis not present

## 2017-04-21 DIAGNOSIS — C7951 Secondary malignant neoplasm of bone: Secondary | ICD-10-CM | POA: Diagnosis not present

## 2017-04-22 DIAGNOSIS — C50919 Malignant neoplasm of unspecified site of unspecified female breast: Secondary | ICD-10-CM | POA: Diagnosis not present

## 2017-04-22 DIAGNOSIS — Z510 Encounter for antineoplastic radiation therapy: Secondary | ICD-10-CM | POA: Diagnosis not present

## 2017-04-22 DIAGNOSIS — C7951 Secondary malignant neoplasm of bone: Secondary | ICD-10-CM | POA: Diagnosis not present

## 2017-04-23 DIAGNOSIS — C50919 Malignant neoplasm of unspecified site of unspecified female breast: Secondary | ICD-10-CM | POA: Diagnosis not present

## 2017-04-23 DIAGNOSIS — Z510 Encounter for antineoplastic radiation therapy: Secondary | ICD-10-CM | POA: Diagnosis not present

## 2017-04-23 DIAGNOSIS — C7951 Secondary malignant neoplasm of bone: Secondary | ICD-10-CM | POA: Diagnosis not present

## 2017-04-24 DIAGNOSIS — Z510 Encounter for antineoplastic radiation therapy: Secondary | ICD-10-CM | POA: Diagnosis not present

## 2017-04-24 DIAGNOSIS — C50919 Malignant neoplasm of unspecified site of unspecified female breast: Secondary | ICD-10-CM | POA: Diagnosis not present

## 2017-04-24 DIAGNOSIS — C7951 Secondary malignant neoplasm of bone: Secondary | ICD-10-CM | POA: Diagnosis not present

## 2017-04-27 DIAGNOSIS — C50919 Malignant neoplasm of unspecified site of unspecified female breast: Secondary | ICD-10-CM | POA: Diagnosis not present

## 2017-04-27 DIAGNOSIS — C7951 Secondary malignant neoplasm of bone: Secondary | ICD-10-CM | POA: Diagnosis not present

## 2017-04-27 DIAGNOSIS — Z510 Encounter for antineoplastic radiation therapy: Secondary | ICD-10-CM | POA: Diagnosis not present

## 2017-04-28 DIAGNOSIS — C7951 Secondary malignant neoplasm of bone: Secondary | ICD-10-CM | POA: Diagnosis not present

## 2017-04-28 DIAGNOSIS — C50919 Malignant neoplasm of unspecified site of unspecified female breast: Secondary | ICD-10-CM | POA: Diagnosis not present

## 2017-04-28 DIAGNOSIS — Z510 Encounter for antineoplastic radiation therapy: Secondary | ICD-10-CM | POA: Diagnosis not present

## 2017-04-29 DIAGNOSIS — C50919 Malignant neoplasm of unspecified site of unspecified female breast: Secondary | ICD-10-CM | POA: Diagnosis not present

## 2017-04-29 DIAGNOSIS — C7951 Secondary malignant neoplasm of bone: Secondary | ICD-10-CM | POA: Diagnosis not present

## 2017-04-29 DIAGNOSIS — Z510 Encounter for antineoplastic radiation therapy: Secondary | ICD-10-CM | POA: Diagnosis not present

## 2017-04-30 DIAGNOSIS — C50919 Malignant neoplasm of unspecified site of unspecified female breast: Secondary | ICD-10-CM | POA: Diagnosis not present

## 2017-04-30 DIAGNOSIS — C7951 Secondary malignant neoplasm of bone: Secondary | ICD-10-CM | POA: Diagnosis not present

## 2017-04-30 DIAGNOSIS — Z510 Encounter for antineoplastic radiation therapy: Secondary | ICD-10-CM | POA: Diagnosis not present

## 2017-04-30 NOTE — Progress Notes (Addendum)
[place]  [phone] [phone]) [phone]   ID: Jade Mooney DOB: 08-Nov-1945  MR#: [id number]  JEH#:[id number]  Patient Care Team: Myer Peer, MD as PCP - General (Family Medicine) Clarene Essex, MD as Consulting Physician (Gastroenterology) Magrinat, Virgie Dad, MD as Consulting Physician (Oncology) Gaynelle Arabian, MD as Consulting Physician (Orthopedic Surgery) Gatha Mayer, MD as Consulting Physician (Radiation Oncology) Kristeen Miss, MD as Consulting Physician (Neurosurgery)  CHIEF COMPLAINT: Newly diagnosed metastatic breast cancer  CURRENT TREATMENT: fulvestrant, denosumab/Xgeva, palbociclib  INTERVAL HISTORY: Jade Mooney returns today for follow-up and treatment of her estrogen receptor positive breast cancer accompanied by her husband. She continues on letrozole, with good tolerance. She notes some hair thinning. She denies having hot flashes.  She also receives zolendoanate every 3 months, most recent dose 04/06/2017. She tolerated this well. She notes that during her last visit, she was informed that se would begin to receive this in the form of injections instead of infusions.  Since her last visit here she received palliative radiation to the cervical spine under Dr. Orlene Erm in [place]. She notes that she is completing 18 treatments on 05/08/2017. She notes that she has some tingling into her neck and in the left upper chest wall.   She was on palbociclib 75 mg 21 days on and 7 days off,  but this is been held during radiation.    REVIEW OF SYSTEMS: Jade Mooney reports that she is doing well. She notes that she tolerated her radiation treatments well. She denies having any other side affects from this other than neck and left upper chest wall tingling. She denies unusual headaches, visual changes, nausea, vomiting, or dizziness. There has been no unusual cough, phlegm production, or pleurisy. This been no change in bowel or bladder habits. She denies  unexplained fatigue or unexplained weight loss, bleeding, rash, or fever. A detailed review of systems was otherwise stable.     BREAST CANCER HISTORY: From the earlier summary notes:  Jade Mooney was referred to Dr. Clarene Essex for evaluation of abdominal discomfort and nausea. Exam and blood work including liver function tests was unremarkable aside from normochromic normocytic anemia. Cholecystitis was suspected and the patient underwent endoscopy followed by abdominal/ pelvic CT scan on 09/29/2013. This showed showed a normal gallbladder. Incidental findings included multiple simple cysts in the liver and aortic atherosclerosis. However mixed lytic and sclerotic bony lesions were noted. On 10/10/2013 the patient underwent biopsy of the right iliac bone, and this showed ([id number]) metastatic invasive ductal carcinoma, grade 2, estrogen and progesterone receptor strongly positive.  Jade Mooney has a remote history of breast cancer, which we tried to reconstruct. She was originally diagnosed early in 1997 with stage III disease, and underwent right mastectomy followed by adjuvant chemotherapy. She then participated in a [facility] protocol with high-dose chemotherapy followed by stem cell rescue 12/22/1995. Unfortunately later that year liver lesions were noted and were biopsy-proven to be metastatic breast cancer. This was not felt to be resectable. However the tumor was estrogen receptor positive and HER-2 positive. The patient was treated with Herceptin (she does not know for what period of time) and was then on tamoxifen until November of 2002. She also participated in a vaccine study at [facility].  Jade Mooney's subsequent history is as detailed below   PAST MEDICAL HISTORY: Past Medical History:  Diagnosis Date  . Anxiety   . Cancer [facility])    Breast 1997 right tx with mastectomy and chemo, metastatic now  . GERD (gastroesophageal reflux disease)   .  Hypercholesterolemia   . Hypertension   . Hypothyroidism   .  Osteoarthritis    oa  . Personal history of chemotherapy   . Personal history of radiation therapy   . TIA (transient ischemic attack) last 11-08-15   x 2 total    PAST SURGICAL HISTORY: Past Surgical History:  Procedure Laterality Date  . CATARACT EXTRACTION, BILATERAL    . MASTECTOMY MODIFIED RADICAL Right 1997  . porta cath insertion  1997   later removed  . radiation tx  finished 02-25-16   x 20 tx  . TONSILLECTOMY    . TOTAL HIP ARTHROPLASTY Left 03/12/2016   Procedure: LEFT TOTAL HIP ARTHROPLASTY ANTERIOR APPROACH;  Surgeon: Gaynelle Arabian, MD;  Location: [facility];  Service: Orthopedics;  Laterality: Left;  . TOTAL KNEE ARTHROPLASTY Left   . TUBAL LIGATION      FAMILY HISTORY Family History  Problem Relation Age of Onset  . Breast cancer Maternal Aunt    The patient's father died at the age of 36 from heart disease. The patient's mother died at the age of 106. The patient had no brothers, one sister. One of the patient's mother's 5 sisters was diagnosed with breast cancer in her 90s  GYNECOLOGIC HISTORY:  No LMP recorded. Patient is postmenopausal. Menarche age 44, the patient is GX P0. She stopped having periods approximately 1994. She used hormone replacement until 1997. 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 Her husband Jade Mooney (goes by "Jade Mooney" was [employer]. of information all services at [employer]. They live alone, with no pets.    ADVANCED DIRECTIVES: In place   HEALTH MAINTENANCE: Social History   Tobacco Use  . Smoking status: Never Smoker  . Smokeless tobacco: Never Used  Substance Use Topics  . Alcohol use: Yes    Comment: 1 glass wine 4-5 x week  . Drug use: No     Colonoscopy: 2013  PAP: 2010  Bone density: 2012  Lipid panel:  Allergies  Allergen Reactions  . Amoxicillin Hives    Has patient had a PCN reaction causing immediate rash,  facial/tongue/throat swelling, SOB or lightheadedness with hypotension:unsure Has patient had a PCN reaction causing severe rash involving mucus membranes or skin necrosis:No Has patient had a PCN reaction that required hospitalization:No Has patient had a PCN reaction occurring within the last 10 years: Yes If all of the above answers are "NO", then may proceed with Cephalosporin use.   Marland Kitchen Percocet [Oxycodone-Acetaminophen] Nausea And Vomiting  . Percodan [Oxycodone-Aspirin] Nausea And Vomiting  . Tramadol Itching    Current Outpatient Medications  Medication Sig Dispense Refill  . acetaminophen (TYLENOL) 500 MG tablet Take 1,000 mg by mouth every 6 (six) hours as needed for mild pain. Reported on 04/17/2015    . gabapentin (NEURONTIN) 300 MG capsule TAKE 1 CAPSULE BY MOUTH THREE TIMES A DAY  3  . letrozole (FEMARA) 2.5 MG tablet Take 1 tablet (2.5 mg total) by mouth daily. 90 tablet 4  . levothyroxine (SYNTHROID, LEVOTHROID) 88 MCG tablet Take 88 mcg by mouth daily before breakfast.    . Lutein 6 MG TABS Take 6 mg by mouth 2 (two) times daily.     . magnesium gluconate (MAGONATE) 500 MG tablet Take 500 mg by mouth at bedtime.    . meclizine (ANTIVERT) 25 MG tablet Take 25 mg by mouth 3 (three) times daily as needed  for dizziness.    Marland Kitchen METRONIDAZOLE, TOPICAL, 0.75 % LOTN Apply 1 application topically at bedtime. Apply to face for rosacea    . palbociclib (IBRANCE) 75 MG capsule Take 1 capsule (75 mg total) by mouth daily with breakfast. Take whole with food on days 1-21, every 28 days. 21 capsule 2  . pantoprazole (PROTONIX) 40 MG tablet Take 40 mg by mouth 2 (two) times daily. Before breakfast and before supper    . Polyethyl Glycol-Propyl Glycol (SYSTANE ULTRA) 0.4-0.3 % SOLN Place 1-2 drops into both eyes 2 (two) times daily.    . polyethylene glycol (MIRALAX / GLYCOLAX) packet Take 4 g by mouth daily. 1 teaspoonful in the morning    . rOPINIRole (REQUIP) 0.5 MG tablet Take 0.5 mg by mouth  3 (three) times daily.    . simvastatin (ZOCOR) 40 MG tablet Take 40 mg by mouth at bedtime.     . traZODone (DESYREL) 50 MG tablet Take 25 mg by mouth at bedtime.      No current facility-administered medications for this visit.     OBJECTIVE: Middle-aged white woman in no acute distress  Vitals:   05/04/17 1044  BP: 125/76  Pulse: (!) 58  Resp: 18  Temp: 98.2 F (36.8 C)  SpO2: 100%     Body mass index is 29.08 kg/m.    ECOG FS:1 - Symptomatic but completely ambulatory  Sclerae unicteric, pupils round and equal Oropharynx clear and moist No cervical or supraclavicular adenopathy Lungs no rales or rhonchi Heart regular rate and rhythm Abd soft, nontender, positive bowel sounds MSK no focal spinal tenderness, no upper extremity lymphedema Neuro: nonfocal, well oriented, appropriate affect Breasts: On the right side she is status post mastectomy.  There is no evidence of chest wall recurrence.  The left breast is unremarkable.  Both axillae are benign.  LAB RESULTS: CMP     Component Value Date/Time   NA 140 05/04/2017 1012   NA 138 02/09/2017 1213   K 4.2 05/04/2017 1012   K 3.9 02/09/2017 1213   CL 105 05/04/2017 1012   CO2 26 05/04/2017 1012   CO2 27 02/09/2017 1213   GLUCOSE 60 (L) 05/04/2017 1012   GLUCOSE 100 02/09/2017 1213   BUN 12 05/04/2017 1012   BUN 12.0 02/09/2017 1213   CREATININE 0.79 05/04/2017 1012   CREATININE 0.9 02/09/2017 1213   CALCIUM 9.0 05/04/2017 1012   CALCIUM 8.7 02/09/2017 1213   PROT 6.5 05/04/2017 1012   PROT 6.5 02/09/2017 1213   ALBUMIN 4.0 05/04/2017 1012   ALBUMIN 4.1 02/09/2017 1213   AST 21 05/04/2017 1012   AST 24 02/09/2017 1213   ALT 14 05/04/2017 1012   ALT 18 02/09/2017 1213   ALKPHOS 70 05/04/2017 1012   ALKPHOS 62 02/09/2017 1213   BILITOT 0.4 05/04/2017 1012   BILITOT 0.34 02/09/2017 1213   GFRNONAA >60 05/04/2017 1012   GFRAA >60 05/04/2017 1012    I No results found for: SPEP  Lab Results  Component  Value Date   WBC 4.3 05/04/2017   NEUTROABS 2.7 05/04/2017   HGB 13.0 05/04/2017   HCT 39.6 05/04/2017   MCV 91.2 05/04/2017   PLT 174 05/04/2017      Chemistry      Component Value Date/Time   NA 140 05/04/2017 1012   NA 138 02/09/2017 1213   K 4.2 05/04/2017 1012   K 3.9 02/09/2017 1213   CL 105 05/04/2017 1012   CO2 26 05/04/2017 1012  CO2 27 02/09/2017 1213   BUN 12 05/04/2017 1012   BUN 12.0 02/09/2017 1213   CREATININE 0.79 05/04/2017 1012   CREATININE 0.9 02/09/2017 1213      Component Value Date/Time   CALCIUM 9.0 05/04/2017 1012   CALCIUM 8.7 02/09/2017 1213   ALKPHOS 70 05/04/2017 1012   ALKPHOS 62 02/09/2017 1213   AST 21 05/04/2017 1012   AST 24 02/09/2017 1213   ALT 14 05/04/2017 1012   ALT 18 02/09/2017 1213   BILITOT 0.4 05/04/2017 1012   BILITOT 0.34 02/09/2017 1213      No results for input(s): INR in the last 168 hours.  Urinalysis    Component Value Date/Time   COLORURINE STRAW (A) 03/05/2016 1350    STUDIES:  Her last mammogram on 03/06/2017 showed breast density category C. There e was no evidence of malignancy.   ASSESSMENT: 72 y.o. Jade Mooney woman with stage IV right breast cancer (bone metastatic disease, subcutaneous nodules)  (1) s/p Right mastectomy 1997 for an estrogen receptor and HER-2 positive breast cancer, treated with  (a) adjuvant chemotherapy according to CALGB 9640 (taxotere x 4 cycles)  (b) high-dose chemotherapy (cyclophosphamide, carboplatin, BCNU) followed by stem cell rescue at [facility] 12/22/1995  (c) biopsy-proven metastatic disease November 2007, estrogen receptor and HER-2 positive  (d) trastuzumab (dose? Duration?)  (e) tamoxifen for 5 years completed 2002  (f) participation in a vaccine study at [facility] 2003 (CEA primed dendritic cells, HER-2 primed dendritic cells)   (2) status post right iliac crest biopsy 10/10/2013 for invasive ductal carcinoma, grade 2, estrogen and progesterone receptor positive  (3)  zolendronate started 10/12/2013,  repeated every 12 weeks  (a) dexa scan 07/28/2013 normal  (b) zolendronate discontinued after January 2019 dose  (4) biopsy of a scalp lesion 10/17/2013 showed metastatic breast cancer, HER-2/neu negative  (a) biopsy of the right anterior chest wall nodule August 2017 shows adenocarcinoma  (5) anastrozole started 10/28/2013, changed to letrozole 06/27/2016  (a) measurable disease = subcutaneous nodules in scalp and LLQ abdomen  (b)  CA-27-29 is informative  (c)  Repeat PET scan 12/28/2014 shows continuing response ; exam shows resolution of scalp lesions  (d) new skin lesion removed from right anterior chest wall August 2017  (e) status post radiation to the anterior lower right chest wall completed 02/25/2017 (20 sessions)   (f) PET scan 04/04/2016 shows 2 new foci of metabolic activity (T8 and manubrium)  (g) palbociclib added 04/21/2016 at 125 mg 21/7  (h) palbociclib dose dropped to 100 mg 21/7 starting with the March 2018 cycle  (i) bone scan 09/26/2016 is stable  (j) PET scan 02/03/2017 shows her bone metastases, and in addition a 0.5 cm right middle lobe nodule which may be new  (k) MRI of the cervical spine shows collapse of the left lateral mass of C3.  There is no soft tissue mass or focal neural impingement.  (l) palbociclib reduced to 75 mg as of September 2018  (m) palbociclib held during cervical spine iradiation  (n) letrozole discontinued February 2019 secondary to likely progression    (6) neoplasia associated pain  (a) painful blastic lesion left femoral intertrochanteric region: Status post radiation completed November 2015  (b) pain left knee, status post TKR remotely  (c) left total hip replacement 03/12/2016  (7) palliative radiation to the cervical spine to be completed 05/08/2017  (8) fulvestrant started 05/04/2017  (a) to resume palbociclib March 2019  (9) denosumab/Xgeva to start 06/01/2017, repeated every 28  days  PLAN:  Tiari is tolerating the radiation well.  She does have some sore throat which I warned her might get considerably worse over the next couple of weeks.  If it does she will let Dr. Orlene Erm knows that he can start Carafate and other supportive medications.  We discussed the fact that given the increased problems from her tumor and the increase in the CA-27-29 there is some evidence of disease progression.  Accordingly we are going off the letrozole and zolendronate.  She is going to start fulvestrant and denosumab instead.  We discussed the possible toxicity side effects and complications of these agents in detail.  She will start the fulvestrant today.  She will receive a second dose in 2 weeks.  She will then receive a dose 4 weeks from today and she will see me that day.  At that time we will also start the denosumab and we will resume the palbociclib at 75 mg daily 21 days on 7 days off.  If all is going well she would like to receive the monthly shots in [place].  She would like to see me of course every 3 months or so and on those visits she would receive treatment here.  I think that is very reasonable and we will set that up for her at the next visit.  In about 4 months we will repeat her scans  She knows to call for any other issues that may develop before then.  She knows to call for any other issues that may develop before then. Magrinat, Virgie Dad, MD  05/04/17 11:13 AM Medical Oncology and Hematology [facility] [address] [place], [place] [address] Tel. [phone]    Fax. [phone]    ADDENDUM: Dyonna had evidence of disease progression after 2 months of palbociclib.  This came after a long period of stable disease.  It is remarkable I think and it does indicate a prior treatment was being effective at controlling the tumor.  Accordingly we are going back to palbociclib.  I am confident that when we repeat a bone scan in approximately  3 months we will see no significant change from the one just obtained.  Otherwise of course we will have to change her treatment to something more aggressive.  She has a good understanding of the issues involved and is very much in agreement with this plan.  I personally saw this patient and performed a substantive portion of this encounter with the listed APP documented above.    Magrinat, Virgie Dad, MD  05/04/17 11:13 AM Medical Oncology and Hematology [facility] [address] [place], [place] [address] Tel. [phone]    Fax. [phone]  This document serves as a record of services personally performed by Lurline Del, MD. It was created on his behalf by Sheron Nightingale, a trained medical scribe. The creation of this record is based on the scribe's personal observations and the provider's statements to them.   I have reviewed the above documentation for accuracy and completeness, and I agree with the above.     ADDENDUM to: Jade Mooney will receive her Faslodex on 05/18/2017 at [facility] and then her next dose of both Faslodex and Xgeva on 311 again in [place].  She will see me 4 weeks after that.  I have checked on this with Dr. Hinton Rao in [place] and I have also called the patient

## 2017-05-04 ENCOUNTER — Inpatient Hospital Stay: Payer: Medicare Other

## 2017-05-04 ENCOUNTER — Telehealth: Payer: Self-pay | Admitting: Oncology

## 2017-05-04 ENCOUNTER — Inpatient Hospital Stay: Payer: Medicare Other | Attending: Oncology | Admitting: Oncology

## 2017-05-04 VITALS — BP 125/76 | HR 58 | Temp 98.2°F | Resp 18 | Ht 63.5 in | Wt 166.8 lb

## 2017-05-04 DIAGNOSIS — J029 Acute pharyngitis, unspecified: Secondary | ICD-10-CM | POA: Diagnosis not present

## 2017-05-04 DIAGNOSIS — G459 Transient cerebral ischemic attack, unspecified: Secondary | ICD-10-CM

## 2017-05-04 DIAGNOSIS — C50811 Malignant neoplasm of overlapping sites of right female breast: Secondary | ICD-10-CM | POA: Diagnosis not present

## 2017-05-04 DIAGNOSIS — C7951 Secondary malignant neoplasm of bone: Principal | ICD-10-CM

## 2017-05-04 DIAGNOSIS — Z5111 Encounter for antineoplastic chemotherapy: Secondary | ICD-10-CM | POA: Diagnosis not present

## 2017-05-04 DIAGNOSIS — C50912 Malignant neoplasm of unspecified site of left female breast: Secondary | ICD-10-CM

## 2017-05-04 DIAGNOSIS — G893 Neoplasm related pain (acute) (chronic): Secondary | ICD-10-CM

## 2017-05-04 DIAGNOSIS — C50919 Malignant neoplasm of unspecified site of unspecified female breast: Secondary | ICD-10-CM | POA: Diagnosis not present

## 2017-05-04 DIAGNOSIS — C50911 Malignant neoplasm of unspecified site of right female breast: Secondary | ICD-10-CM

## 2017-05-04 DIAGNOSIS — Z170 Estrogen receptor positive status [ER+]: Secondary | ICD-10-CM

## 2017-05-04 DIAGNOSIS — R970 Elevated carcinoembryonic antigen [CEA]: Secondary | ICD-10-CM

## 2017-05-04 DIAGNOSIS — Z510 Encounter for antineoplastic radiation therapy: Secondary | ICD-10-CM | POA: Diagnosis not present

## 2017-05-04 LAB — COMPREHENSIVE METABOLIC PANEL
ALT: 14 U/L (ref 0–55)
AST: 21 U/L (ref 5–34)
Albumin: 4 g/dL (ref 3.5–5.0)
Alkaline Phosphatase: 70 U/L (ref 40–150)
Anion gap: 9 (ref 3–11)
BUN: 12 mg/dL (ref 7–26)
CO2: 26 mmol/L (ref 22–29)
Calcium: 9 mg/dL (ref 8.4–10.4)
Chloride: 105 mmol/L (ref 98–109)
Creatinine, Ser: 0.79 mg/dL (ref 0.60–1.10)
GFR calc Af Amer: >60 mL/min (ref >60)
GFR calc non Af Amer: >60 mL/min (ref >60)
Glucose, Bld: 60 mg/dL — ABNORMAL LOW (ref 70–140)
Potassium: 4.2 mmol/L (ref 3.5–5.1)
Sodium: 140 mmol/L (ref 136–145)
Total Bilirubin: 0.4 mg/dL (ref 0.2–1.2)
Total Protein: 6.5 g/dL (ref 6.4–8.3)

## 2017-05-04 LAB — CBC WITH DIFFERENTIAL/PLATELET
Basophils Absolute: 0.1 10*3/uL (ref 0.0–0.1)
Basophils Relative: 1 %
Eosinophils Absolute: 0.2 10*3/uL (ref 0.0–0.5)
Eosinophils Relative: 4 %
HCT: 39.6 % (ref 34.8–46.6)
Hemoglobin: 13 g/dL (ref 11.6–15.9)
Lymphocytes Relative: 18 %
Lymphs Abs: 0.8 10*3/uL — ABNORMAL LOW (ref 0.9–3.3)
MCH: 30 pg (ref 25.1–34.0)
MCHC: 32.8 g/dL (ref 31.5–36.0)
MCV: 91.2 fL (ref 79.5–101.0)
Monocytes Absolute: 0.6 10*3/uL (ref 0.1–0.9)
Monocytes Relative: 15 %
Neutro Abs: 2.7 10*3/uL (ref 1.5–6.5)
Neutrophils Relative %: 62 %
Platelets: 174 10*3/uL (ref 145–400)
RBC: 4.34 MIL/uL (ref 3.70–5.45)
RDW: 14.9 % — ABNORMAL HIGH (ref 11.2–14.5)
WBC: 4.3 10*3/uL (ref 3.9–10.3)

## 2017-05-04 MED ORDER — FULVESTRANT 250 MG/5ML IM SOLN
500.0000 mg | Freq: Once | INTRAMUSCULAR | Status: AC
  Administered 2017-05-04: 500 mg via INTRAMUSCULAR

## 2017-05-04 NOTE — Addendum Note (Signed)
Addended by: Chauncey Cruel on: 05/04/2017 11:37 AM   Modules accepted: Orders

## 2017-05-04 NOTE — Telephone Encounter (Signed)
Gave avs and calendar for march °

## 2017-05-04 NOTE — Patient Instructions (Signed)

## 2017-05-05 DIAGNOSIS — C50919 Malignant neoplasm of unspecified site of unspecified female breast: Secondary | ICD-10-CM | POA: Diagnosis not present

## 2017-05-05 DIAGNOSIS — Z510 Encounter for antineoplastic radiation therapy: Secondary | ICD-10-CM | POA: Diagnosis not present

## 2017-05-05 DIAGNOSIS — C7951 Secondary malignant neoplasm of bone: Secondary | ICD-10-CM | POA: Diagnosis not present

## 2017-05-05 LAB — CANCER ANTIGEN 27.29: CA 27.29: 85.6 U/mL — ABNORMAL HIGH (ref 0.0–38.6)

## 2017-05-06 DIAGNOSIS — C50919 Malignant neoplasm of unspecified site of unspecified female breast: Secondary | ICD-10-CM | POA: Diagnosis not present

## 2017-05-06 DIAGNOSIS — Z510 Encounter for antineoplastic radiation therapy: Secondary | ICD-10-CM | POA: Diagnosis not present

## 2017-05-06 DIAGNOSIS — C7951 Secondary malignant neoplasm of bone: Secondary | ICD-10-CM | POA: Diagnosis not present

## 2017-05-06 DIAGNOSIS — D510 Vitamin B12 deficiency anemia due to intrinsic factor deficiency: Secondary | ICD-10-CM | POA: Diagnosis not present

## 2017-05-07 DIAGNOSIS — Z510 Encounter for antineoplastic radiation therapy: Secondary | ICD-10-CM | POA: Diagnosis not present

## 2017-05-07 DIAGNOSIS — C50919 Malignant neoplasm of unspecified site of unspecified female breast: Secondary | ICD-10-CM | POA: Diagnosis not present

## 2017-05-07 DIAGNOSIS — C7951 Secondary malignant neoplasm of bone: Secondary | ICD-10-CM | POA: Diagnosis not present

## 2017-05-08 DIAGNOSIS — Z510 Encounter for antineoplastic radiation therapy: Secondary | ICD-10-CM | POA: Diagnosis not present

## 2017-05-08 DIAGNOSIS — C7951 Secondary malignant neoplasm of bone: Secondary | ICD-10-CM | POA: Diagnosis not present

## 2017-05-08 DIAGNOSIS — C50919 Malignant neoplasm of unspecified site of unspecified female breast: Secondary | ICD-10-CM | POA: Diagnosis not present

## 2017-05-11 ENCOUNTER — Other Ambulatory Visit: Payer: Self-pay | Admitting: Oncology

## 2017-05-12 DIAGNOSIS — Z510 Encounter for antineoplastic radiation therapy: Secondary | ICD-10-CM | POA: Diagnosis not present

## 2017-05-12 DIAGNOSIS — C50919 Malignant neoplasm of unspecified site of unspecified female breast: Secondary | ICD-10-CM | POA: Diagnosis not present

## 2017-05-12 DIAGNOSIS — C7951 Secondary malignant neoplasm of bone: Secondary | ICD-10-CM | POA: Diagnosis not present

## 2017-05-18 DIAGNOSIS — C7951 Secondary malignant neoplasm of bone: Secondary | ICD-10-CM | POA: Diagnosis not present

## 2017-05-18 DIAGNOSIS — Z5111 Encounter for antineoplastic chemotherapy: Secondary | ICD-10-CM | POA: Diagnosis not present

## 2017-05-18 DIAGNOSIS — C50919 Malignant neoplasm of unspecified site of unspecified female breast: Secondary | ICD-10-CM | POA: Diagnosis not present

## 2017-05-22 ENCOUNTER — Ambulatory Visit: Payer: Medicare Other

## 2017-06-01 DIAGNOSIS — C7951 Secondary malignant neoplasm of bone: Secondary | ICD-10-CM | POA: Diagnosis not present

## 2017-06-01 DIAGNOSIS — E782 Mixed hyperlipidemia: Secondary | ICD-10-CM | POA: Diagnosis not present

## 2017-06-01 DIAGNOSIS — R978 Other abnormal tumor markers: Secondary | ICD-10-CM | POA: Diagnosis not present

## 2017-06-01 DIAGNOSIS — Z79899 Other long term (current) drug therapy: Secondary | ICD-10-CM | POA: Diagnosis not present

## 2017-06-01 DIAGNOSIS — C50919 Malignant neoplasm of unspecified site of unspecified female breast: Secondary | ICD-10-CM | POA: Diagnosis not present

## 2017-06-01 DIAGNOSIS — E039 Hypothyroidism, unspecified: Secondary | ICD-10-CM | POA: Diagnosis not present

## 2017-06-01 DIAGNOSIS — Z5111 Encounter for antineoplastic chemotherapy: Secondary | ICD-10-CM | POA: Diagnosis not present

## 2017-06-04 DIAGNOSIS — J310 Chronic rhinitis: Secondary | ICD-10-CM | POA: Diagnosis not present

## 2017-06-04 DIAGNOSIS — D510 Vitamin B12 deficiency anemia due to intrinsic factor deficiency: Secondary | ICD-10-CM | POA: Diagnosis not present

## 2017-06-04 DIAGNOSIS — C50919 Malignant neoplasm of unspecified site of unspecified female breast: Secondary | ICD-10-CM | POA: Diagnosis not present

## 2017-06-04 DIAGNOSIS — D492 Neoplasm of unspecified behavior of bone, soft tissue, and skin: Secondary | ICD-10-CM | POA: Diagnosis not present

## 2017-06-04 DIAGNOSIS — D519 Vitamin B12 deficiency anemia, unspecified: Secondary | ICD-10-CM | POA: Diagnosis not present

## 2017-06-04 DIAGNOSIS — C7951 Secondary malignant neoplasm of bone: Secondary | ICD-10-CM | POA: Diagnosis not present

## 2017-06-12 ENCOUNTER — Ambulatory Visit: Payer: Medicare Other | Admitting: Oncology

## 2017-06-12 ENCOUNTER — Other Ambulatory Visit: Payer: Medicare Other

## 2017-06-15 DIAGNOSIS — Z86010 Personal history of colonic polyps: Secondary | ICD-10-CM | POA: Diagnosis not present

## 2017-06-15 DIAGNOSIS — K210 Gastro-esophageal reflux disease with esophagitis: Secondary | ICD-10-CM | POA: Diagnosis not present

## 2017-06-29 ENCOUNTER — Telehealth: Payer: Self-pay | Admitting: Adult Health

## 2017-06-29 ENCOUNTER — Inpatient Hospital Stay (HOSPITAL_BASED_OUTPATIENT_CLINIC_OR_DEPARTMENT_OTHER): Payer: Medicare Other | Admitting: Adult Health

## 2017-06-29 ENCOUNTER — Inpatient Hospital Stay: Payer: Medicare Other | Attending: Oncology

## 2017-06-29 ENCOUNTER — Telehealth: Payer: Self-pay | Admitting: Pharmacist

## 2017-06-29 ENCOUNTER — Encounter: Payer: Self-pay | Admitting: Adult Health

## 2017-06-29 ENCOUNTER — Inpatient Hospital Stay: Payer: Medicare Other

## 2017-06-29 VITALS — BP 156/79 | HR 60 | Temp 98.3°F | Resp 18 | Ht 63.5 in | Wt 168.0 lb

## 2017-06-29 DIAGNOSIS — C50912 Malignant neoplasm of unspecified site of left female breast: Secondary | ICD-10-CM

## 2017-06-29 DIAGNOSIS — C7951 Secondary malignant neoplasm of bone: Secondary | ICD-10-CM | POA: Insufficient documentation

## 2017-06-29 DIAGNOSIS — C50811 Malignant neoplasm of overlapping sites of right female breast: Secondary | ICD-10-CM

## 2017-06-29 DIAGNOSIS — Z170 Estrogen receptor positive status [ER+]: Secondary | ICD-10-CM

## 2017-06-29 DIAGNOSIS — C50911 Malignant neoplasm of unspecified site of right female breast: Secondary | ICD-10-CM

## 2017-06-29 DIAGNOSIS — C50919 Malignant neoplasm of unspecified site of unspecified female breast: Secondary | ICD-10-CM

## 2017-06-29 DIAGNOSIS — Z5111 Encounter for antineoplastic chemotherapy: Secondary | ICD-10-CM | POA: Diagnosis not present

## 2017-06-29 LAB — CBC WITH DIFFERENTIAL/PLATELET
Basophils Absolute: 0 10*3/uL (ref 0.0–0.1)
Basophils Relative: 1 %
Eosinophils Absolute: 0.1 10*3/uL (ref 0.0–0.5)
Eosinophils Relative: 3 %
HCT: 40.7 % (ref 34.8–46.6)
Hemoglobin: 13.1 g/dL (ref 11.6–15.9)
Lymphocytes Relative: 18 %
Lymphs Abs: 0.8 10*3/uL — ABNORMAL LOW (ref 0.9–3.3)
MCH: 28.8 pg (ref 25.1–34.0)
MCHC: 32.2 g/dL (ref 31.5–36.0)
MCV: 89.5 fL (ref 79.5–101.0)
Monocytes Absolute: 0.4 10*3/uL (ref 0.1–0.9)
Monocytes Relative: 8 %
Neutro Abs: 3.3 10*3/uL (ref 1.5–6.5)
Neutrophils Relative %: 70 %
Platelets: 208 10*3/uL (ref 145–400)
RBC: 4.55 MIL/uL (ref 3.70–5.45)
RDW: 14.8 % — ABNORMAL HIGH (ref 11.2–14.5)
WBC: 4.7 10*3/uL (ref 3.9–10.3)

## 2017-06-29 LAB — COMPREHENSIVE METABOLIC PANEL
ALT: 14 U/L (ref 0–55)
AST: 21 U/L (ref 5–34)
Albumin: 4.2 g/dL (ref 3.5–5.0)
Alkaline Phosphatase: 77 U/L (ref 40–150)
Anion gap: 9 (ref 3–11)
BUN: 15 mg/dL (ref 7–26)
CO2: 27 mmol/L (ref 22–29)
Calcium: 9.2 mg/dL (ref 8.4–10.4)
Chloride: 103 mmol/L (ref 98–109)
Creatinine, Ser: 0.82 mg/dL (ref 0.60–1.10)
GFR calc Af Amer: >60 mL/min (ref >60)
GFR calc non Af Amer: >60 mL/min (ref >60)
Glucose, Bld: 77 mg/dL (ref 70–140)
Potassium: 4.2 mmol/L (ref 3.5–5.1)
Sodium: 139 mmol/L (ref 136–145)
Total Bilirubin: 0.4 mg/dL (ref 0.2–1.2)
Total Protein: 6.8 g/dL (ref 6.4–8.3)

## 2017-06-29 MED ORDER — FULVESTRANT 250 MG/5ML IM SOLN
INTRAMUSCULAR | Status: AC
  Filled 2017-06-29: qty 10

## 2017-06-29 MED ORDER — FULVESTRANT 250 MG/5ML IM SOLN
500.0000 mg | Freq: Once | INTRAMUSCULAR | Status: AC
  Administered 2017-06-29: 500 mg via INTRAMUSCULAR

## 2017-06-29 MED ORDER — DENOSUMAB 120 MG/1.7ML ~~LOC~~ SOLN
SUBCUTANEOUS | Status: AC
  Filled 2017-06-29: qty 1.7

## 2017-06-29 MED ORDER — DENOSUMAB 120 MG/1.7ML ~~LOC~~ SOLN
120.0000 mg | Freq: Once | SUBCUTANEOUS | Status: AC
  Administered 2017-06-29: 120 mg via SUBCUTANEOUS

## 2017-06-29 NOTE — Telephone Encounter (Signed)
Gave avs and calendar ° °

## 2017-06-29 NOTE — Telephone Encounter (Signed)
Oral Chemotherapy Pharmacist Encounter  Follow-Up Form  Spoke with patient today to follow up regarding patient's oral chemotherapy medication: Ibrance for the treatment of metastatic, hormone receptor positive, Her-2 negative breast cancer in combination with Faslodex until disease progression or unacceptable toxicity  Original Start date of oral chemotherapy: 04/21/16  Pt is doing well today  Patient has been off [place] for the past 2 months due to radiation therapy. She will restart Ibrance today, 06/29/2017  Pt reports the following side effects: none  Pertinent labs reviewed: Okay for treatment.  Dispensed samples to patient:  Medication: Ibrance 75 mg capsules Instructions: Take 1 capsule (75 mg) by mouth once daily with whole food, take for 21 days on, 7 days off, repeat every 28 days. Quantity dispensed: 42 Days supply: 56 Manufacturer: [place] Lot: T [id number] Exp: 08/2018   Patient knows to call the office with questions or concerns. Oral Oncology Clinic will continue to follow.  Thank you,  Johny Drilling, PharmD, BCPS, BCOP 06/29/2017 1:21 PM Oral Oncology Clinic [phone]

## 2017-06-29 NOTE — Progress Notes (Addendum)
[facility]  [phone] [phone]   ID: Jade Mooney DOB: 08/12/1945  MR#: [id number]  EPP#:[id number]  Patient Care Team: Myer Peer, MD as PCP - General (Family Medicine) Clarene Essex, MD as Consulting Physician (Gastroenterology) Magrinat, Virgie Dad, MD as Consulting Physician (Oncology) Gaynelle Arabian, MD as Consulting Physician (Orthopedic Surgery) Gatha Mayer, MD as Consulting Physician (Radiation Oncology) Kristeen Miss, MD as Consulting Physician (Neurosurgery)  CHIEF COMPLAINT: Newly diagnosed metastatic breast cancer  CURRENT TREATMENT: fulvestrant, denosumab/Xgeva, palbociclib  INTERVAL HISTORY: Jade Mooney returns today for follow-up and treatment of her estrogen receptor positive breast cancer accompanied by her husband.  She has started Fulvestrant and is tolerating it well, other than some pain she experiences during the injection.  She also continues on Xgeva which is again tolerated well.    Jade Mooney has not restarted the Palbociclib yet and wants to know the plan for this.      REVIEW OF SYSTEMS: Jade Mooney reports that she is doing well. She tolerated her cervical spine radiation well.  She denies any new pain.  Her weight is stable and her appetite is good.  She is getting around well, and denies any new issues such as fevers, chills, nausea, vomiting, constipation, diarrhea, or any other concerns, a detailed ROS is non contributory.    BREAST CANCER HISTORY: From the earlier summary notes:  Jade Mooney was referred to Dr. Clarene Essex for evaluation of abdominal discomfort and nausea. Exam and blood work including liver function tests was unremarkable aside from normochromic normocytic anemia. Cholecystitis was suspected and the patient underwent endoscopy followed by abdominal/ pelvic CT scan on 09/29/2013. This showed showed a normal gallbladder. Incidental findings included multiple simple cysts in the liver and aortic atherosclerosis.  However mixed lytic and sclerotic bony lesions were noted. On 10/10/2013 the patient underwent biopsy of the right iliac bone, and this showed ([id number]) metastatic invasive ductal carcinoma, grade 2, estrogen and progesterone receptor strongly positive.  Jade Mooney has a remote history of breast cancer, which we tried to reconstruct. She was originally diagnosed early in 1997 with stage III disease, and underwent right mastectomy followed by adjuvant chemotherapy. She then participated in a [facility] protocol with high-dose chemotherapy followed by stem cell rescue 12/22/1995. Unfortunately later that year liver lesions were noted and were biopsy-proven to be metastatic breast cancer. This was not felt to be resectable. However the tumor was estrogen receptor positive and HER-2 positive. The patient was treated with Herceptin (she does not know for what period of time) and was then on tamoxifen until November of 2002. She also participated in a vaccine study at [facility]  Jade Mooney's subsequent history is as detailed below   PAST MEDICAL HISTORY: Past Medical History:  Diagnosis Date  . Anxiety   . Cancer [facility])    Breast 1997 right tx with mastectomy and chemo, metastatic now  . GERD (gastroesophageal reflux disease)   . Hypercholesterolemia   . Hypertension   . Hypothyroidism   . Osteoarthritis    oa  . Personal history of chemotherapy   . Personal history of radiation therapy   . TIA (transient ischemic attack) last 11-08-15   x 2 total    PAST SURGICAL HISTORY: Past Surgical History:  Procedure Laterality Date  . CATARACT EXTRACTION, BILATERAL    . MASTECTOMY MODIFIED RADICAL Right 1997  . porta cath insertion  1997   later removed  . radiation tx  finished 02-25-16   x 20 tx  . TONSILLECTOMY    .  TOTAL HIP ARTHROPLASTY Left 03/12/2016   Procedure: LEFT TOTAL HIP ARTHROPLASTY ANTERIOR APPROACH;  Surgeon: Gaynelle Arabian, MD;  Location: [facility];  Service: Orthopedics;  Laterality: Left;  .  TOTAL KNEE ARTHROPLASTY Left   . TUBAL LIGATION      FAMILY HISTORY Family History  Problem Relation Age of Onset  . Breast cancer Maternal Aunt    The patient's father died at the age of 43 from heart disease. The patient's mother died at the age of 73. The patient had no brothers, one sister. One of the patient's mother's 5 sisters was diagnosed with breast cancer in her 1s  GYNECOLOGIC HISTORY:  No LMP recorded. Patient is postmenopausal. Menarche age 31, the patient is GX P0. She stopped having periods approximately 1994. She used hormone replacement until 1997. 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 Her husband Jade Mooney (goes by "Jade Mooney" was [employer]. of information all services at [employer]. They live alone, with no pets.   ADVANCED DIRECTIVES: In place   HEALTH MAINTENANCE: Social History   Tobacco Use  . Smoking status: Never Smoker  . Smokeless tobacco: Never Used  Substance Use Topics  . Alcohol use: Yes    Comment: 1 glass wine 4-5 x week  . Drug use: No     Colonoscopy: 2013  PAP: 2010  Bone density: 2012  Lipid panel:  Allergies  Allergen Reactions  . Amoxicillin Hives    Has patient had a PCN reaction causing immediate rash, facial/tongue/throat swelling, SOB or lightheadedness with hypotension:unsure Has patient had a PCN reaction causing severe rash involving mucus membranes or skin necrosis:No Has patient had a PCN reaction that required hospitalization:No Has patient had a PCN reaction occurring within the last 10 years: Yes If all of the above answers are "NO", then may proceed with Cephalosporin use.   Marland Kitchen Percocet [Oxycodone-Acetaminophen] Nausea And Vomiting  . Percodan [Oxycodone-Aspirin] Nausea And Vomiting  . Tramadol Itching    Current Outpatient Medications  Medication Sig Dispense Refill  . acetaminophen (TYLENOL) 500 MG tablet Take  1,000 mg by mouth every 6 (six) hours as needed for mild pain. Reported on 04/17/2015    . clopidogrel (PLAVIX) 75 MG tablet Take 1 tablet (75 mg total) by mouth daily.    Marland Kitchen gabapentin (NEURONTIN) 300 MG capsule TAKE 1 CAPSULE BY MOUTH THREE TIMES A DAY  3  . levothyroxine (SYNTHROID, LEVOTHROID) 88 MCG tablet Take 88 mcg by mouth daily before breakfast.    . Lutein 6 MG TABS Take 6 mg by mouth 2 (two) times daily.     . magnesium gluconate (MAGONATE) 500 MG tablet Take 500 mg by mouth at bedtime.    . meclizine (ANTIVERT) 25 MG tablet Take 25 mg by mouth 3 (three) times daily as needed for dizziness.    Marland Kitchen METRONIDAZOLE, TOPICAL, 0.75 % LOTN Apply 1 application topically at bedtime. Apply to face for rosacea    . pantoprazole (PROTONIX) 40 MG tablet Take 40 mg by mouth 2 (two) times daily. Before breakfast and before supper    . Polyethyl Glycol-Propyl Glycol (SYSTANE ULTRA) 0.4-0.3 % SOLN Place 1-2 drops into both eyes 2 (two) times daily.    . polyethylene glycol (MIRALAX / GLYCOLAX) packet Take 4 g by mouth daily. 1 teaspoonful in the morning    . rOPINIRole (REQUIP) 0.5 MG tablet Take 0.5 mg by mouth 3 (three)  times daily.    . simvastatin (ZOCOR) 40 MG tablet Take 40 mg by mouth at bedtime.     . traZODone (DESYREL) 50 MG tablet Take 25 mg by mouth at bedtime.     . palbociclib (IBRANCE) 75 MG capsule Take 1 capsule (75 mg total) by mouth daily with breakfast. Take whole with food on days 1-21, every 28 days. (Patient not taking: Reported on 06/29/2017) 21 capsule 2   No current facility-administered medications for this visit.     OBJECTIVE:   Vitals:   06/29/17 1110  BP: (!) 156/79  Pulse: 60  Resp: 18  Temp: 98.3 F (36.8 C)  SpO2: 100%     Body mass index is 29.29 kg/m.    ECOG FS:1 - Symptomatic but completely ambulatory  GENERAL: Patient is a well appearing female in no acute distress HEENT:  Sclerae anicteric.  Oropharynx clear and moist. No ulcerations or evidence of  oropharyngeal candidiasis. Neck is supple.  NODES:  No cervical, supraclavicular, or axillary lymphadenopathy palpated.  BREAST EXAM:  Deferred. LUNGS:  Clear to auscultation bilaterally.  No wheezes or rhonchi. HEART:  Regular rate and rhythm. No murmur appreciated. ABDOMEN:  Soft, nontender.  Positive, normoactive bowel sounds. No organomegaly palpated. MSK:  No focal spinal tenderness to palpation. Full range of motion bilaterally in the upper extremities. EXTREMITIES:  No peripheral edema.   SKIN:  Clear with no obvious rashes or skin changes. No nail dyscrasia. NEURO:  Nonfocal. Well oriented.  Appropriate affect.   LAB RESULTS: CMP     Component Value Date/Time   NA 140 05/04/2017 1012   NA 138 02/09/2017 1213   K 4.2 05/04/2017 1012   K 3.9 02/09/2017 1213   CL 105 05/04/2017 1012   CO2 26 05/04/2017 1012   CO2 27 02/09/2017 1213   GLUCOSE 60 (L) 05/04/2017 1012   GLUCOSE 100 02/09/2017 1213   BUN 12 05/04/2017 1012   BUN 12.0 02/09/2017 1213   CREATININE 0.79 05/04/2017 1012   CREATININE 0.9 02/09/2017 1213   CALCIUM 9.0 05/04/2017 1012   CALCIUM 8.7 02/09/2017 1213   PROT 6.5 05/04/2017 1012   PROT 6.5 02/09/2017 1213   ALBUMIN 4.0 05/04/2017 1012   ALBUMIN 4.1 02/09/2017 1213   AST 21 05/04/2017 1012   AST 24 02/09/2017 1213   ALT 14 05/04/2017 1012   ALT 18 02/09/2017 1213   ALKPHOS 70 05/04/2017 1012   ALKPHOS 62 02/09/2017 1213   BILITOT 0.4 05/04/2017 1012   BILITOT 0.34 02/09/2017 1213   GFRNONAA >60 05/04/2017 1012   GFRAA >60 05/04/2017 1012    I No results found for: SPEP  Lab Results  Component Value Date   WBC 4.7 06/29/2017   NEUTROABS 3.3 06/29/2017   HGB 13.1 06/29/2017   HCT 40.7 06/29/2017   MCV 89.5 06/29/2017   PLT 208 06/29/2017      Chemistry      Component Value Date/Time   NA 140 05/04/2017 1012   NA 138 02/09/2017 1213   K 4.2 05/04/2017 1012   K 3.9 02/09/2017 1213   CL 105 05/04/2017 1012   CO2 26 05/04/2017 1012    CO2 27 02/09/2017 1213   BUN 12 05/04/2017 1012   BUN 12.0 02/09/2017 1213   CREATININE 0.79 05/04/2017 1012   CREATININE 0.9 02/09/2017 1213      Component Value Date/Time   CALCIUM 9.0 05/04/2017 1012   CALCIUM 8.7 02/09/2017 1213   ALKPHOS 70 05/04/2017 1012  ALKPHOS 62 02/09/2017 1213   AST 21 05/04/2017 1012   AST 24 02/09/2017 1213   ALT 14 05/04/2017 1012   ALT 18 02/09/2017 1213   BILITOT 0.4 05/04/2017 1012   BILITOT 0.34 02/09/2017 1213      No results for input(s): INR in the last 168 hours.  Urinalysis    Component Value Date/Time   COLORURINE STRAW (A) 03/05/2016 1350    STUDIES:  Her last mammogram on 03/06/2017 showed breast density category C. There e was no evidence of malignancy.   ASSESSMENT: 72 y.o. [place] woman with stage IV right breast cancer (bone metastatic disease, subcutaneous nodules)  (1) s/p Right mastectomy 1997 for an estrogen receptor and HER-2 positive breast cancer, treated with  (a) adjuvant chemotherapy according to CALGB 9640 (taxotere x 4 cycles)  (b) high-dose chemotherapy (cyclophosphamide, carboplatin, BCNU) followed by stem cell rescue at [facility] 12/22/1995  (c) biopsy-proven metastatic disease November 2007, estrogen receptor and HER-2 positive  (d) trastuzumab (dose? Duration?)  (e) tamoxifen for 5 years completed 2002  (f) participation in a vaccine study at [facility] 2003 (CEA primed dendritic cells, HER-2 primed dendritic cells)   (2) status post right iliac crest biopsy 10/10/2013 for invasive ductal carcinoma, grade 2, estrogen and progesterone receptor positive  (3) zolendronate started 10/12/2013,  repeated every 12 weeks  (a) dexa scan 07/28/2013 normal  (b) zolendronate discontinued after January 2019 dose  (4) biopsy of a scalp lesion 10/17/2013 showed metastatic breast cancer, HER-2/neu negative  (a) biopsy of the right anterior chest wall nodule August 2017 shows adenocarcinoma  (5) anastrozole started 10/28/2013,  changed to letrozole 06/27/2016  (a) measurable disease = subcutaneous nodules in scalp and LLQ abdomen  (b)  CA-27-29 is informative  (c)  Repeat PET scan 12/28/2014 shows continuing response ; exam shows resolution of scalp lesions  (d) new skin lesion removed from right anterior chest wall August 2017  (e) status post radiation to the anterior lower right chest wall completed 02/25/2017 (20 sessions)   (f) PET scan 04/04/2016 shows 2 new foci of metabolic activity (T8 and manubrium)  (g) palbociclib added 04/21/2016 at 125 mg 21/7  (h) palbociclib dose dropped to 100 mg 21/7 starting with the March 2018 cycle  (i) bone scan 09/26/2016 is stable  (j) PET scan 02/03/2017 shows her bone metastases, and in addition a 0.5 cm right middle lobe nodule which may be new  (k) MRI of the cervical spine shows collapse of the left lateral mass of C3.  There is no soft tissue mass or focal neural impingement.  (l) palbociclib reduced to 75 mg as of September 2018  (m) palbociclib held during cervical spine irradiation finished in mid February, 2019  (n) letrozole discontinued February 2019 secondary to likely progression    (6) neoplasia associated pain  (a) painful blastic lesion left femoral intertrochanteric region: Status post radiation completed November 2015  (b) pain left knee, status post TKR remotely  (c) left total hip replacement 03/12/2016  (7) palliative radiation to the cervical spine to be completed 05/08/2017  (8) fulvestrant started 05/04/2017  (a) Palbociclib resumed 06/29/2017  (9) denosumab/Xgeva to start 06/01/2017, repeated every 28 days  PLAN:  Arrow is doing well today.  She will restart the Palbociclib.  She will proceed with Fulvestrant today along with Xgeva.  She also met with Dr. Jana Hakim today and reviewed her plan.  Please see his addendum below.     Wilber Bihari, NP  06/29/17 11:20 AM Medical Oncology  and Hematology [facility] [address] [place], [place] [address] Tel. [phone]    Fax. [phone]    ADDENDUM: Zilla tolerated the radiation under Dr. Orlene Erm without any unusual side effects.  She has good range of motion in her neck.  She does not have any significant pain in that area.  She does still have some tingling and paresthesia in the left neck.  She can relieve this by turning her head slightly to the right and tilting the neck slightly right words.  She is aware that she can resolve this issue with surgery and has discussed this with Dr. Ellene Route.  At this point, in the absence of any symptoms more troublesome than those just described, she is opting for follow-up only.  Of course if things change she will consider surgery later on.  In the meantime were going back to her standard treatment with palbociclib in addition to fulvestrant and now Xgeva.  She is tolerating these well.  The plan is to continue them and restage shortly before her June visit.  She is scheduled for colonoscopy 07/29/2017  She knows to call for any other issues that may develop before the next visit. I personally saw this patient and performed a substantive portion of this encounter with the listed APP documented above.   Chauncey Cruel, MD Medical Oncology and Hematology [facility] [address] [place],  [address] Tel. [phone]    Fax. [phone]

## 2017-06-29 NOTE — Patient Instructions (Signed)
Fulvestrant injection What is this medicine? FULVESTRANT (ful VES trant) blocks the effects of estrogen. It is used to treat breast cancer. This medicine may be used for other purposes; ask your health care provider or pharmacist if you have questions. COMMON BRAND NAME(S): FASLODEX What should I tell my health care provider before I take this medicine? They need to know if you have any of these conditions: -bleeding problems -liver disease -low levels of platelets in the blood -an unusual or allergic reaction to fulvestrant, other medicines, foods, dyes, or preservatives -pregnant or trying to get pregnant -breast-feeding How should I use this medicine? This medicine is for injection into a muscle. It is usually given by a health care professional in a hospital or clinic setting. Talk to your pediatrician regarding the use of this medicine in children. Special care may be needed. Overdosage: If you think you have taken too much of this medicine contact a poison control center or emergency room at once. NOTE: This medicine is only for you. Do not share this medicine with others. What if I miss a dose? It is important not to miss your dose. Call your doctor or health care professional if you are unable to keep an appointment. What may interact with this medicine? -medicines that treat or prevent blood clots like warfarin, enoxaparin, and dalteparin This list may not describe all possible interactions. Give your health care provider a list of all the medicines, herbs, non-prescription drugs, or dietary supplements you use. Also tell them if you smoke, drink alcohol, or use illegal drugs. Some items may interact with your medicine. What should I watch for while using this medicine? Your condition will be monitored carefully while you are receiving this medicine. You will need important blood work done while you are taking this medicine. Do not become pregnant while taking this medicine or for  at least 1 year after stopping it. Women of child-bearing potential will need to have a negative pregnancy test before starting this medicine. Women should inform their doctor if they wish to become pregnant or think they might be pregnant. There is a potential for serious side effects to an unborn child. Men should inform their doctors if they wish to father a child. This medicine may lower sperm counts. Talk to your health care professional or pharmacist for more information. Do not breast-feed an infant while taking this medicine or for 1 year after the last dose. What side effects may I notice from receiving this medicine? Side effects that you should report to your doctor or health care professional as soon as possible: -allergic reactions like skin rash, itching or hives, swelling of the face, lips, or tongue -feeling faint or lightheaded, falls -pain, tingling, numbness, or weakness in the legs -signs and symptoms of infection like fever or chills; cough; flu-like symptoms; sore throat -vaginal bleeding Side effects that usually do not require medical attention (report to your doctor or health care professional if they continue or are bothersome): -aches, pains -constipation -diarrhea -headache -hot flashes -nausea, vomiting -pain at site where injected -stomach pain This list may not describe all possible side effects. Call your doctor for medical advice about side effects. You may report side effects to FDA at 1-800-FDA-1088. Where should I keep my medicine? This drug is given in a hospital or clinic and will not be stored at home. NOTE: This sheet is a summary. It may not cover all possible information. If you have questions about this medicine, talk to your   doctor, pharmacist, or health care provider.  2018 [employer]/Gold Standard (2014-10-06 11:03:55) Denosumab injection What is this medicine? DENOSUMAB (den oh sue mab) slows bone breakdown. Prolia is used to treat osteoporosis in  women after menopause and in men. Delton See is used to treat a high calcium level due to cancer and to prevent bone fractures and other bone problems caused by multiple myeloma or cancer bone metastases. Delton See is also used to treat giant cell tumor of the bone. This medicine may be used for other purposes; ask your health care provider or pharmacist if you have questions. COMMON BRAND NAME(S): Prolia, XGEVA What should I tell my health care provider before I take this medicine? They need to know if you have any of these conditions: -dental disease -having surgery or tooth extraction -infection -kidney disease -low levels of calcium or Vitamin D in the blood -malnutrition -on hemodialysis -skin conditions or sensitivity -thyroid or parathyroid disease -an unusual reaction to denosumab, other medicines, foods, dyes, or preservatives -pregnant or trying to get pregnant -breast-feeding How should I use this medicine? This medicine is for injection under the skin. It is given by a health care professional in a hospital or clinic setting. If you are getting Prolia, a special MedGuide will be given to you by the pharmacist with each prescription and refill. Be sure to read this information carefully each time. For Prolia, talk to your pediatrician regarding the use of this medicine in children. Special care may be needed. For Delton See, talk to your pediatrician regarding the use of this medicine in children. While this drug may be prescribed for children as young as 13 years for selected conditions, precautions do apply. Overdosage: If you think you have taken too much of this medicine contact a poison control center or emergency room at once. NOTE: This medicine is only for you. Do not share this medicine with others. What if I miss a dose? It is important not to miss your dose. Call your doctor or health care professional if you are unable to keep an appointment. What may interact with this  medicine? Do not take this medicine with any of the following medications: -other medicines containing denosumab This medicine may also interact with the following medications: -medicines that lower your chance of fighting infection -steroid medicines like prednisone or cortisone This list may not describe all possible interactions. Give your health care provider a list of all the medicines, herbs, non-prescription drugs, or dietary supplements you use. Also tell them if you smoke, drink alcohol, or use illegal drugs. Some items may interact with your medicine. What should I watch for while using this medicine? Visit your doctor or health care professional for regular checks on your progress. Your doctor or health care professional may order blood tests and other tests to see how you are doing. Call your doctor or health care professional for advice if you get a fever, chills or sore throat, or other symptoms of a cold or flu. Do not treat yourself. This drug may decrease your body's ability to fight infection. Try to avoid being around people who are sick. You should make sure you get enough calcium and vitamin D while you are taking this medicine, unless your doctor tells you not to. Discuss the foods you eat and the vitamins you take with your health care professional. See your dentist regularly. Brush and floss your teeth as directed. Before you have any dental work done, tell your dentist you are receiving this medicine. Do  not become pregnant while taking this medicine or for 5 months after stopping it. Talk with your doctor or health care professional about your birth control options while taking this medicine. Women should inform their doctor if they wish to become pregnant or think they might be pregnant. There is a potential for serious side effects to an unborn child. Talk to your health care professional or pharmacist for more information. What side effects may I notice from receiving this  medicine? Side effects that you should report to your doctor or health care professional as soon as possible: -allergic reactions like skin rash, itching or hives, swelling of the face, lips, or tongue -bone pain -breathing problems -dizziness -jaw pain, especially after dental work -redness, blistering, peeling of the skin -signs and symptoms of infection like fever or chills; cough; sore throat; pain or trouble passing urine -signs of low calcium like fast heartbeat, muscle cramps or muscle pain; pain, tingling, numbness in the hands or feet; seizures -unusual bleeding or bruising -unusually weak or tired Side effects that usually do not require medical attention (report to your doctor or health care professional if they continue or are bothersome): -constipation -diarrhea -headache -joint pain -loss of appetite -muscle pain -runny nose -tiredness -upset stomach This list may not describe all possible side effects. Call your doctor for medical advice about side effects. You may report side effects to FDA at 1-800-FDA-1088. Where should I keep my medicine? This medicine is only given in a clinic, doctor's office, or other health care setting and will not be stored at home. NOTE: This sheet is a summary. It may not cover all possible information. If you have questions about this medicine, talk to your doctor, pharmacist, or health care provider.  2018 [employer]/Gold Standard (2016-04-01 19:17:21)  

## 2017-06-30 LAB — CANCER ANTIGEN 27.29: CA 27.29: 72.1 U/mL — ABNORMAL HIGH (ref 0.0–38.6)

## 2017-07-02 DIAGNOSIS — D510 Vitamin B12 deficiency anemia due to intrinsic factor deficiency: Secondary | ICD-10-CM | POA: Diagnosis not present

## 2017-07-03 DIAGNOSIS — Z96642 Presence of left artificial hip joint: Secondary | ICD-10-CM | POA: Diagnosis not present

## 2017-07-03 DIAGNOSIS — M1612 Unilateral primary osteoarthritis, left hip: Secondary | ICD-10-CM | POA: Diagnosis not present

## 2017-07-03 DIAGNOSIS — Z471 Aftercare following joint replacement surgery: Secondary | ICD-10-CM | POA: Diagnosis not present

## 2017-07-13 ENCOUNTER — Telehealth: Payer: Self-pay

## 2017-07-27 DIAGNOSIS — C50919 Malignant neoplasm of unspecified site of unspecified female breast: Secondary | ICD-10-CM | POA: Diagnosis not present

## 2017-07-27 DIAGNOSIS — Z5111 Encounter for antineoplastic chemotherapy: Secondary | ICD-10-CM | POA: Diagnosis not present

## 2017-07-27 DIAGNOSIS — C7951 Secondary malignant neoplasm of bone: Secondary | ICD-10-CM | POA: Diagnosis not present

## 2017-07-29 DIAGNOSIS — Z86010 Personal history of colonic polyps: Secondary | ICD-10-CM | POA: Diagnosis not present

## 2017-07-29 DIAGNOSIS — K5730 Diverticulosis of large intestine without perforation or abscess without bleeding: Secondary | ICD-10-CM | POA: Diagnosis not present

## 2017-08-03 ENCOUNTER — Telehealth: Payer: Self-pay

## 2017-08-03 DIAGNOSIS — D510 Vitamin B12 deficiency anemia due to intrinsic factor deficiency: Secondary | ICD-10-CM | POA: Diagnosis not present

## 2017-08-03 NOTE — Telephone Encounter (Signed)
Returned pt's call regarding her PCP at [facility] sent over lab results from today and she would like to know if she should restart Ibrance every day or every other day.  I let pt know, per Dr Jana Hakim the lab results are very good and let's restart 'every other day'.  Pt voiced understanding. No other needs per pt at this time.

## 2017-08-11 ENCOUNTER — Ambulatory Visit (HOSPITAL_COMMUNITY)
Admission: RE | Admit: 2017-08-11 | Discharge: 2017-08-11 | Disposition: A | Discharge status: Home / Self Care | Payer: Medicare Other | Source: Ambulatory Visit | Attending: Adult Health | Admitting: Adult Health

## 2017-08-11 DIAGNOSIS — C50919 Malignant neoplasm of unspecified site of unspecified female breast: Secondary | ICD-10-CM | POA: Insufficient documentation

## 2017-08-11 DIAGNOSIS — I2510 Atherosclerotic heart disease of native coronary artery without angina pectoris: Secondary | ICD-10-CM | POA: Diagnosis not present

## 2017-08-11 DIAGNOSIS — I700 Atherosclerosis of aorta: Secondary | ICD-10-CM | POA: Diagnosis not present

## 2017-08-11 DIAGNOSIS — C7951 Secondary malignant neoplasm of bone: Secondary | ICD-10-CM | POA: Diagnosis not present

## 2017-08-11 LAB — GLUCOSE, CAPILLARY
Glucose-Capillary: 106 mg/dL — ABNORMAL HIGH (ref 65–99)
Glucose-Capillary: 46 mg/dL — ABNORMAL LOW (ref 65–99)

## 2017-08-11 MED ORDER — FLUDEOXYGLUCOSE F - 18 (FDG) INJECTION
8.3000 | Freq: Once | INTRAVENOUS | Status: AC | PRN
Start: 2017-08-11 — End: 2017-08-11
  Administered 2017-08-11: 8.3 via INTRAVENOUS

## 2017-08-24 ENCOUNTER — Encounter: Payer: Self-pay | Admitting: Adult Health

## 2017-08-24 ENCOUNTER — Inpatient Hospital Stay: Payer: Medicare Other

## 2017-08-24 ENCOUNTER — Inpatient Hospital Stay: Payer: Medicare Other | Attending: Oncology | Admitting: Adult Health

## 2017-08-24 ENCOUNTER — Telehealth: Payer: Self-pay | Admitting: Adult Health

## 2017-08-24 VITALS — BP 149/70 | HR 59 | Temp 98.6°F | Resp 18 | Ht 63.5 in | Wt 167.9 lb

## 2017-08-24 DIAGNOSIS — C50811 Malignant neoplasm of overlapping sites of right female breast: Secondary | ICD-10-CM | POA: Diagnosis not present

## 2017-08-24 DIAGNOSIS — C50912 Malignant neoplasm of unspecified site of left female breast: Secondary | ICD-10-CM

## 2017-08-24 DIAGNOSIS — Z170 Estrogen receptor positive status [ER+]: Secondary | ICD-10-CM | POA: Diagnosis not present

## 2017-08-24 DIAGNOSIS — Z7951 Long term (current) use of inhaled steroids: Secondary | ICD-10-CM

## 2017-08-24 DIAGNOSIS — C7951 Secondary malignant neoplasm of bone: Principal | ICD-10-CM

## 2017-08-24 DIAGNOSIS — Z5111 Encounter for antineoplastic chemotherapy: Secondary | ICD-10-CM | POA: Insufficient documentation

## 2017-08-24 DIAGNOSIS — I700 Atherosclerosis of aorta: Secondary | ICD-10-CM | POA: Diagnosis not present

## 2017-08-24 DIAGNOSIS — C50919 Malignant neoplasm of unspecified site of unspecified female breast: Secondary | ICD-10-CM

## 2017-08-24 DIAGNOSIS — C50911 Malignant neoplasm of unspecified site of right female breast: Secondary | ICD-10-CM

## 2017-08-24 LAB — COMPREHENSIVE METABOLIC PANEL
ALT: 11 U/L (ref 0–55)
AST: 18 U/L (ref 5–34)
Albumin: 4.3 g/dL (ref 3.5–5.0)
Alkaline Phosphatase: 65 U/L (ref 40–150)
Anion gap: 10 (ref 3–11)
BUN: 14 mg/dL (ref 7–26)
CO2: 23 mmol/L (ref 22–29)
Calcium: 9.1 mg/dL (ref 8.4–10.4)
Chloride: 106 mmol/L (ref 98–109)
Creatinine, Ser: 0.82 mg/dL (ref 0.60–1.10)
GFR calc Af Amer: >60 mL/min (ref >60)
GFR calc non Af Amer: >60 mL/min (ref >60)
Glucose, Bld: 100 mg/dL (ref 70–140)
Potassium: 4.2 mmol/L (ref 3.5–5.1)
Sodium: 139 mmol/L (ref 136–145)
Total Bilirubin: 0.4 mg/dL (ref 0.2–1.2)
Total Protein: 6.7 g/dL (ref 6.4–8.3)

## 2017-08-24 LAB — CBC WITH DIFFERENTIAL/PLATELET
Basophils Absolute: 0.1 10*3/uL (ref 0.0–0.1)
Basophils Relative: 2 %
Eosinophils Absolute: 0.1 10*3/uL (ref 0.0–0.5)
Eosinophils Relative: 2 %
HCT: 38.9 % (ref 34.8–46.6)
Hemoglobin: 12.9 g/dL (ref 11.6–15.9)
Lymphocytes Relative: 18 %
Lymphs Abs: 0.5 10*3/uL — ABNORMAL LOW (ref 0.9–3.3)
MCH: 29 pg (ref 25.1–34.0)
MCHC: 33.2 g/dL (ref 31.5–36.0)
MCV: 87.2 fL (ref 79.5–101.0)
Monocytes Absolute: 0.3 10*3/uL (ref 0.1–0.9)
Monocytes Relative: 12 %
Neutro Abs: 1.8 10*3/uL (ref 1.5–6.5)
Neutrophils Relative %: 66 %
Platelets: 166 10*3/uL (ref 145–400)
RBC: 4.46 MIL/uL (ref 3.70–5.45)
RDW: 18.8 % — ABNORMAL HIGH (ref 11.2–14.5)
WBC: 2.8 10*3/uL — ABNORMAL LOW (ref 3.9–10.3)

## 2017-08-24 MED ORDER — FULVESTRANT 250 MG/5ML IM SOLN
500.0000 mg | Freq: Once | INTRAMUSCULAR | Status: AC
  Administered 2017-08-24: 500 mg via INTRAMUSCULAR

## 2017-08-24 MED ORDER — FULVESTRANT 250 MG/5ML IM SOLN
INTRAMUSCULAR | Status: AC
  Filled 2017-08-24: qty 10

## 2017-08-24 MED ORDER — DENOSUMAB 120 MG/1.7ML ~~LOC~~ SOLN
120.0000 mg | Freq: Once | SUBCUTANEOUS | Status: AC
  Administered 2017-08-24: 120 mg via SUBCUTANEOUS

## 2017-08-24 MED ORDER — DENOSUMAB 120 MG/1.7ML ~~LOC~~ SOLN
SUBCUTANEOUS | Status: AC
  Filled 2017-08-24: qty 1.7

## 2017-08-24 NOTE — Telephone Encounter (Signed)
Gave avs and calendar ° °

## 2017-08-24 NOTE — Progress Notes (Signed)
Jade  [phone]) [phone] Fax:(336) [phone]   ID: Jade Mooney DOB: September 10, 1945  MR#: [id number]  FHL#:[id number]  Patient Care Team: Myer Peer, MD as PCP - General (Family Medicine) Clarene Essex, MD as Consulting Physician (Gastroenterology) Magrinat, Virgie Dad, MD as Consulting Physician (Oncology) Gaynelle Arabian, MD as Consulting Physician (Orthopedic Surgery) Gatha Mayer, MD as Consulting Physician (Radiation Oncology) Kristeen Miss, MD as Consulting Physician (Neurosurgery)  CHIEF COMPLAINT: Newly diagnosed metastatic breast cancer  CURRENT TREATMENT: fulvestrant, denosumab/Xgeva, palbociclib  INTERVAL HISTORY: Jade Mooney returns today for follow-up and treatment of her estrogen receptor positive breast cancer accompanied by her husband.  She has started Fulvestrant and is tolerating it well, other than some pain she experiences during the injection.  She also continues on Xgeva which is also tolerated well.    Jade Mooney restarted palbociclib last month and has tolerated it well thus far.  She takes 75 mg every other day for three weeks, and then takes one week off.     REVIEW OF SYSTEMS: Jade Mooney is doing well today.  She denies any new pain.  She recently underwent a PET scan on 08/11/2017, that showed no FDG avid disease.  She reviewed these results over the phone with Dr. Jana Hakim.  Jade Mooney is doing quite well today and a detailed ROS was conducted which was entirely non contributory.    BREAST CANCER HISTORY: From the earlier summary notes:  Jade Mooney was referred to Dr. Clarene Essex for evaluation of abdominal discomfort and nausea. Exam and blood work including liver function tests was unremarkable aside from normochromic normocytic anemia. Cholecystitis was suspected and the patient underwent endoscopy followed by abdominal/ pelvic CT scan on 09/29/2013. This showed showed a normal gallbladder. Incidental findings included multiple simple cysts in the  liver and aortic atherosclerosis. However mixed lytic and sclerotic bony lesions were noted. On 10/10/2013 the patient underwent biopsy of the right iliac bone, and this showed ([id number]) metastatic invasive ductal carcinoma, grade 2, estrogen and progesterone receptor strongly positive.  Jade Mooney has a remote history of breast cancer, which we tried to reconstruct. She was originally diagnosed early in 1997 with stage III disease, and underwent right mastectomy followed by adjuvant chemotherapy. She then participated in a [facility] protocol with high-dose chemotherapy followed by stem cell rescue 12/22/1995. Unfortunately later that year liver lesions were noted and were biopsy-proven to be metastatic breast cancer. This was not felt to be resectable. However the tumor was estrogen receptor positive and HER-2 positive. The patient was treated with Herceptin (she does not know for what period of time) and was then on tamoxifen until November of 2002. She also participated in a vaccine study at [facility].  Jade Mooney subsequent history is as detailed below   PAST MEDICAL HISTORY: Past Medical History:  Diagnosis Date  . Anxiety   . Cancer [facility])    Breast 1997 right tx with mastectomy and chemo, metastatic now  . GERD (gastroesophageal reflux disease)   . Hypercholesterolemia   . Hypertension   . Hypothyroidism   . Osteoarthritis    oa  . Personal history of chemotherapy   . Personal history of radiation therapy   . TIA (transient ischemic attack) last 11-08-15   x 2 total    PAST SURGICAL HISTORY: Past Surgical History:  Procedure Laterality Date  . CATARACT EXTRACTION, BILATERAL    . MASTECTOMY MODIFIED RADICAL Right 1997  . porta cath insertion  1997   later removed  . radiation tx  finished 02-25-16  x 20 tx  . TONSILLECTOMY    . TOTAL HIP ARTHROPLASTY Left 03/12/2016   Procedure: LEFT TOTAL HIP ARTHROPLASTY ANTERIOR APPROACH;  Surgeon: Gaynelle Arabian, MD;  Location: [facility];  Service:  Orthopedics;  Laterality: Left;  . TOTAL KNEE ARTHROPLASTY Left   . TUBAL LIGATION      FAMILY HISTORY Family History  Problem Relation Age of Onset  . Breast cancer Maternal Aunt    The patient's father died at the age of 26 from heart disease. The patient's mother died at the age of 66. The patient had no brothers, one sister. One of the patient's mother's 5 sisters was diagnosed with breast cancer in her 37s  GYNECOLOGIC HISTORY:  No LMP recorded. Patient is postmenopausal. Menarche age 50, the patient is GX P0. She stopped having periods approximately 1994. She used hormone replacement until 1997. 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 Her husband Jade Mooney (goes by "Jade Mooney" was [employer]. of information all services at [employer]. They live alone, with no pets.   ADVANCED DIRECTIVES: In place   HEALTH MAINTENANCE: Social History   Tobacco Use  . Smoking status: Never Smoker  . Smokeless tobacco: Never Used  Substance Use Topics  . Alcohol use: Yes    Comment: 1 glass wine 4-5 x week  . Drug use: No     Colonoscopy: 2013  PAP: 2010  Bone density: 2012  Lipid panel:  Allergies  Allergen Reactions  . Amoxicillin Hives    Has patient had a PCN reaction causing immediate rash, facial/tongue/throat swelling, SOB or lightheadedness with hypotension:unsure Has patient had a PCN reaction causing severe rash involving mucus membranes or skin necrosis:No Has patient had a PCN reaction that required hospitalization:No Has patient had a PCN reaction occurring within the last 10 years: Yes If all of the above answers are "NO", then may proceed with Cephalosporin use.   Marland Kitchen Percocet [Oxycodone-Acetaminophen] Nausea And Vomiting  . Percodan [Oxycodone-Aspirin] Nausea And Vomiting  . Tramadol Itching    Current Outpatient Medications  Medication Sig Dispense Refill  .  acetaminophen (TYLENOL) 500 MG tablet Take 1,000 mg by mouth every 6 (six) hours as needed for mild pain. Reported on 04/17/2015    . clopidogrel (PLAVIX) 75 MG tablet Take 1 tablet (75 mg total) by mouth daily.    . cyanocobalamin (,VITAMIN B-12,) 1000 MCG/ML injection Inject 1,000 mcg into the muscle once.    Marland Kitchen levothyroxine (SYNTHROID, LEVOTHROID) 88 MCG tablet Take 88 mcg by mouth daily before breakfast.    . Lutein 6 MG TABS Take 6 mg by mouth 2 (two) times daily.     . magnesium gluconate (MAGONATE) 500 MG tablet Take 500 mg by mouth at bedtime.    . meclizine (ANTIVERT) 25 MG tablet Take 25 mg by mouth 3 (three) times daily as needed for dizziness.    Marland Kitchen METRONIDAZOLE, TOPICAL, 0.75 % LOTN Apply 1 application topically at bedtime. Apply to face for rosacea    . palbociclib (IBRANCE) 75 MG capsule Take 1 capsule (75 mg total) by mouth daily with breakfast. Take whole with food on days 1-21, every 28 days. (Patient taking differently: Take 75 mg by mouth every other day. Take whole with food on days 1-21, every 28 days.) 21 capsule 2  . pantoprazole (PROTONIX) 40 MG tablet Take 40 mg by mouth 2 (two) times daily. Before breakfast and  before supper    . Polyethyl Glycol-Propyl Glycol (SYSTANE ULTRA) 0.4-0.3 % SOLN Place 1-2 drops into both eyes 2 (two) times daily.    . polyethylene glycol (MIRALAX / GLYCOLAX) packet Take 4 g by mouth daily. 1 teaspoonful in the morning    . rOPINIRole (REQUIP) 0.5 MG tablet Take 0.5 mg by mouth 3 (three) times daily.    . simvastatin (ZOCOR) 40 MG tablet Take 40 mg by mouth at bedtime.     . traZODone (DESYREL) 50 MG tablet Take 25 mg by mouth at bedtime.      No current facility-administered medications for this visit.     OBJECTIVE:   Vitals:   08/24/17 1018  BP: (!) 149/70  Pulse: (!) 59  Resp: 18  Temp: 98.6 F (37 C)  SpO2: 100%     Body mass index is 29.28 kg/m.    ECOG FS:1 - Symptomatic but completely ambulatory  GENERAL: Patient is a well  appearing female in no acute distress HEENT:  Sclerae anicteric.  Oropharynx clear and moist. No ulcerations or evidence of oropharyngeal candidiasis. Neck is supple.  NODES:  No cervical, supraclavicular, or axillary lymphadenopathy palpated.  BREAST EXAM:  Deferred. LUNGS:  Clear to auscultation bilaterally.  No wheezes or rhonchi. HEART:  Regular rate and rhythm. No murmur appreciated. ABDOMEN:  Soft, nontender.  Positive, normoactive bowel sounds. No organomegaly palpated. MSK:  No focal spinal tenderness to palpation. Full range of motion bilaterally in the upper extremities. EXTREMITIES:  No peripheral edema.   SKIN:  Clear with no obvious rashes or skin changes. No nail dyscrasia. NEURO:  Nonfocal. Well oriented.  Appropriate affect.   LAB RESULTS: CMP     Component Value Date/Time   NA 139 06/29/2017 1055   NA 138 02/09/2017 1213   K 4.2 06/29/2017 1055   K 3.9 02/09/2017 1213   CL 103 06/29/2017 1055   CO2 27 06/29/2017 1055   CO2 27 02/09/2017 1213   GLUCOSE 77 06/29/2017 1055   GLUCOSE 100 02/09/2017 1213   BUN 15 06/29/2017 1055   BUN 12.0 02/09/2017 1213   CREATININE 0.82 06/29/2017 1055   CREATININE 0.9 02/09/2017 1213   CALCIUM 9.2 06/29/2017 1055   CALCIUM 8.7 02/09/2017 1213   PROT 6.8 06/29/2017 1055   PROT 6.5 02/09/2017 1213   ALBUMIN 4.2 06/29/2017 1055   ALBUMIN 4.1 02/09/2017 1213   AST 21 06/29/2017 1055   AST 24 02/09/2017 1213   ALT 14 06/29/2017 1055   ALT 18 02/09/2017 1213   ALKPHOS 77 06/29/2017 1055   ALKPHOS 62 02/09/2017 1213   BILITOT 0.4 06/29/2017 1055   BILITOT 0.34 02/09/2017 1213   GFRNONAA >60 06/29/2017 1055   GFRAA >60 06/29/2017 1055    I No results found for: SPEP  Lab Results  Component Value Date   WBC 2.8 (L) 08/24/2017   NEUTROABS 1.8 08/24/2017   HGB 12.9 08/24/2017   HCT 38.9 08/24/2017   MCV 87.2 08/24/2017   PLT 166 08/24/2017      Chemistry      Component Value Date/Time   NA 139 06/29/2017 1055   NA  138 02/09/2017 1213   K 4.2 06/29/2017 1055   K 3.9 02/09/2017 1213   CL 103 06/29/2017 1055   CO2 27 06/29/2017 1055   CO2 27 02/09/2017 1213   BUN 15 06/29/2017 1055   BUN 12.0 02/09/2017 1213   CREATININE 0.82 06/29/2017 1055   CREATININE 0.9 02/09/2017 1213  Component Value Date/Time   CALCIUM 9.2 06/29/2017 1055   CALCIUM 8.7 02/09/2017 1213   ALKPHOS 77 06/29/2017 1055   ALKPHOS 62 02/09/2017 1213   AST 21 06/29/2017 1055   AST 24 02/09/2017 1213   ALT 14 06/29/2017 1055   ALT 18 02/09/2017 1213   BILITOT 0.4 06/29/2017 1055   BILITOT 0.34 02/09/2017 1213      No results for input(s): INR in the last 168 hours.  Urinalysis    Component Value Date/Time   COLORURINE STRAW (A) 03/05/2016 1350    STUDIES:  Her last mammogram on 03/06/2017 showed breast density category C. There e was no evidence of malignancy.   ASSESSMENT: 72 y.o. [place] woman with stage IV right breast cancer (bone metastatic disease, subcutaneous nodules)  (1) s/p Right mastectomy 1997 for an estrogen receptor and HER-2 positive breast cancer, treated with  (a) adjuvant chemotherapy according to CALGB 9640 (taxotere x 4 cycles)  (b) high-dose chemotherapy (cyclophosphamide, carboplatin, BCNU) followed by stem cell rescue at [facility] 12/22/1995  (c) biopsy-proven metastatic disease November 2007, estrogen receptor and HER-2 positive  (d) trastuzumab (dose? Duration?)  (e) tamoxifen for 5 years completed 2002  (f) participation in a vaccine study at [facility] 2003 (CEA primed dendritic cells, HER-2 primed dendritic cells)   (2) status post right iliac crest biopsy 10/10/2013 for invasive ductal carcinoma, grade 2, estrogen and progesterone receptor positive  (3) zolendronate started 10/12/2013,  repeated every 12 weeks  (a) dexa scan 07/28/2013 normal  (b) zolendronate discontinued after January 2019 dose  (4) biopsy of a scalp lesion 10/17/2013 showed metastatic breast cancer, HER-2/neu  negative  (a) biopsy of the right anterior chest wall nodule August 2017 shows adenocarcinoma  (5) anastrozole started 10/28/2013, changed to letrozole 06/27/2016  (a) measurable disease = subcutaneous nodules in scalp and LLQ abdomen  (b)  CA-27-29 is informative  (c)  Repeat PET scan 12/28/2014 shows continuing response ; exam shows resolution of scalp lesions  (d) new skin lesion removed from right anterior chest wall August 2017  (e) status post radiation to the anterior lower right chest wall completed 02/25/2017 (20 sessions)   (f) PET scan 04/04/2016 shows 2 new foci of metabolic activity (T8 and manubrium)  (g) palbociclib added 04/21/2016 at 125 mg 21/7  (h) palbociclib dose dropped to 100 mg 21/7 starting with the March 2018 cycle  (i) bone scan 09/26/2016 is stable  (j) PET scan 02/03/2017 shows her bone metastases, and in addition a 0.5 cm right middle lobe nodule which may be new  (k) MRI of the cervical spine shows collapse of the left lateral mass of C3.  There is no soft tissue mass or focal neural impingement.  (l) palbociclib reduced to 75 mg as of September 2018  (m) palbociclib held during cervical spine irradiation finished in mid February, 2019  (n) letrozole discontinued February 2019 secondary to likely progression    (6) neoplasia associated pain  (a) painful blastic lesion left femoral intertrochanteric region: Status post radiation completed November 2015  (b) pain left knee, status post TKR remotely  (c) left total hip replacement 03/12/2016  (7) palliative radiation to the cervical spine to be completed 05/08/2017  (8) fulvestrant started 05/04/2017  (a) Palbociclib resumed 06/29/2017  (9) denosumab/Xgeva to start 06/01/2017, repeated every 28 days  PLAN:  Jade Mooney is doing well today. She will continue on Palbociclib, 50m every other day, 3 weeks on, one week off.  I reviewed her CBC with her today and  it is within parameters for her to continue. She will  also receive her Fulvestrant and Xgeva.  She will receive these in [place] on 7/1 and 7/29 and will see Dr. Jana Hakim on 9/3, for lab, f/u and an injection (she will be on vacation the week prior).    Jade Mooney and I again reviewed the copy of her PET scan results.  I noted and added to her problem list that she has aortic atherosclerois.  She notes she has a h/o two TIA's.  She plans on discussing the atherosclerosis with her PCP at her next appointment with them.    Jade Mooney knows to call for any questions or concerns prior to her next appointment with [place].    A total of (30) minutes of face-to-face time was spent with this patient with greater than 50% of that time in counseling and care-coordination.  Wilber Bihari, NP  08/24/17 10:37 AM Medical Oncology and Hematology [facility] [address] [place], [place] [address] Tel. [phone]    Fax. [phone]

## 2017-08-25 ENCOUNTER — Encounter: Payer: Self-pay | Admitting: Oncology

## 2017-08-25 LAB — CANCER ANTIGEN 27.29: CA 27.29: 56.4 U/mL — ABNORMAL HIGH (ref 0.0–38.6)

## 2017-09-02 DIAGNOSIS — D510 Vitamin B12 deficiency anemia due to intrinsic factor deficiency: Secondary | ICD-10-CM | POA: Diagnosis not present

## 2017-09-02 DIAGNOSIS — Z9181 History of falling: Secondary | ICD-10-CM | POA: Diagnosis not present

## 2017-09-02 DIAGNOSIS — M25512 Pain in left shoulder: Secondary | ICD-10-CM | POA: Diagnosis not present

## 2017-09-02 DIAGNOSIS — Z6829 Body mass index (BMI) 29.0-29.9, adult: Secondary | ICD-10-CM | POA: Diagnosis not present

## 2017-09-11 ENCOUNTER — Telehealth: Payer: Self-pay | Admitting: *Deleted

## 2017-09-11 NOTE — Telephone Encounter (Signed)
This RN spoke with pt per her call stating recommendation from her primary MD - Dr Nicki Reaper to have a cortisone injection to her left shoulder.  " then I looked on my sheet from the [facility] and it states to inform your doctor if you are on steroids while receiving this drug "  Jade Mooney is wanting to verify that the steroid injection to her shoulder is ok to have.  This RN discussed above including primary concern is chronic/long term use of steroids while on Prolia.  Jade Mooney is currently scheduled for injection Thursday June 27.  She is scheduled for faslodex and Prolia on July 1st at [facility]  Of note she states Dr Nicki Reaper also did an xray of her left shoulder - he wanted an comparison to her PET scan so he sent the film to [facility] " and the radiologist there said he couldn't access my PET scan to compare "  Jade Mooney says Dr Lars Mage sees an abnormality " that reads as c/w metastatic disease " ( compatible with ) " but he wanted to see if that area showed up on the PET "  This note will be given to MD for review and any further recommendations.  Return call number for pt is [phone].

## 2017-09-14 ENCOUNTER — Other Ambulatory Visit: Payer: Self-pay | Admitting: Oncology

## 2017-09-14 ENCOUNTER — Encounter: Payer: Self-pay | Admitting: Oncology

## 2017-09-14 ENCOUNTER — Telehealth: Payer: Self-pay

## 2017-09-14 NOTE — Telephone Encounter (Signed)
Letter faxed to Dr Luevenia Maxin office

## 2017-09-17 DIAGNOSIS — M67911 Unspecified disorder of synovium and tendon, right shoulder: Secondary | ICD-10-CM | POA: Diagnosis not present

## 2017-09-21 ENCOUNTER — Encounter: Payer: Self-pay | Admitting: Oncology

## 2017-09-21 DIAGNOSIS — C50919 Malignant neoplasm of unspecified site of unspecified female breast: Secondary | ICD-10-CM | POA: Diagnosis not present

## 2017-09-21 DIAGNOSIS — Z5111 Encounter for antineoplastic chemotherapy: Secondary | ICD-10-CM | POA: Diagnosis not present

## 2017-09-21 DIAGNOSIS — C7951 Secondary malignant neoplasm of bone: Secondary | ICD-10-CM | POA: Diagnosis not present

## 2017-10-16 ENCOUNTER — Telehealth: Payer: Self-pay | Admitting: Pharmacist

## 2017-10-16 NOTE — Telephone Encounter (Signed)
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 in combination with Faslodex until disease progression or unacceptable toxicity  Original Start date of oral chemotherapy: 04/21/2016 Leslee Home re-start: 06/29/2017 on1 capsule (75 mg) by mouth once daily with whole food, take for 21 days on, 7 days off, repeat every 28 days  Dose changed to 1 capsule (75 mg) by mouth once every other day with whole food, take for 21 days on, 7 days off, repeat every 28 days on 08/03/2017  Pt reports 0 tablets/doses of Ibrance '75mg'$  caspules, 1 capsule (75 mg) by mouth once every other day with whole food, take for 21 days on, 7 days off, repeat every 28 days, missed in the last month.   Pt reports the following side effects: mild, manageable fatigue  Pertinent labs reviewed: OK for continued treatment.   Dispensed samples to patient:  Medication: Ibrance '75mg'$  capsules Instructions: Take  1 capsule (75 mg) by mouth once every other day with whole food, take for 21 days on, 7 days off, repeat every 28 days Quantity dispensed: 21 Days supply: 55 Manufacturer: Pfizer Lot: [id number] Exp: 05/2018   Patient knows to call the office with questions or concerns. Oral Oncology Clinic will continue to follow.  Thank you,  Johny Drilling, PharmD, BCPS, BCOP  10/16/2017 12:03 PM Oral Oncology Clinic [phone]

## 2017-10-19 DIAGNOSIS — C7951 Secondary malignant neoplasm of bone: Secondary | ICD-10-CM | POA: Diagnosis not present

## 2017-10-19 DIAGNOSIS — C50919 Malignant neoplasm of unspecified site of unspecified female breast: Secondary | ICD-10-CM | POA: Diagnosis not present

## 2017-10-19 DIAGNOSIS — Z5111 Encounter for antineoplastic chemotherapy: Secondary | ICD-10-CM | POA: Diagnosis not present

## 2017-10-23 ENCOUNTER — Other Ambulatory Visit: Payer: Self-pay

## 2017-10-26 DIAGNOSIS — M79671 Pain in right foot: Secondary | ICD-10-CM | POA: Diagnosis not present

## 2017-10-26 DIAGNOSIS — Z6829 Body mass index (BMI) 29.0-29.9, adult: Secondary | ICD-10-CM | POA: Diagnosis not present

## 2017-11-06 DIAGNOSIS — D510 Vitamin B12 deficiency anemia due to intrinsic factor deficiency: Secondary | ICD-10-CM | POA: Diagnosis not present

## 2017-11-06 DIAGNOSIS — Z79899 Other long term (current) drug therapy: Secondary | ICD-10-CM | POA: Diagnosis not present

## 2017-11-19 NOTE — Progress Notes (Signed)
[place]  Telephone:([phone]) [phone] Fax:(336) [phone]   ID: Jade Mooney DOB: 09/11/1945  MR#: [id number]  CXK#:[id number]  Patient Care Team: Jade Peer, MD as PCP - General (Family Medicine) Jade Essex, MD as Consulting Physician (Gastroenterology) Jade Mooney, Jade Dad, MD as Consulting Physician (Oncology) Jade Arabian, MD as Consulting Physician (Orthopedic Surgery) Jade Mayer, MD as Consulting Physician (Radiation Oncology) Jade Miss, MD as Consulting Physician (Neurosurgery)  CHIEF COMPLAINT: Newly diagnosed metastatic breast cancer  CURRENT TREATMENT: fulvestrant, denosumab/Xgeva, palbociclib  INTERVAL HISTORY: Jade Mooney returns today for follow-up and treatment of her estrogen receptor positive breast cancer accompanied by her husband. She continues on palbociclib, currently at 75 mg every other day for 3 weeks on and 1 week off. She tolerates this well. She denies any nausea, vomiting, or diarrhea.   She also receives fulvestrant every 4 weeks, in [place]. She tolerates this well. She denies having any side affects other than soreness from the injections.   Finally she receives denosumab/Xgeva every 4 weeks, again and aspirin she also tolerates this well.    REVIEW OF SYSTEMS: Jade Mooney reports that she has some right foot pain. She doesn't think this is due to a bunion, but the pain gets better with walking. She followed up with Dr. Nicki Mooney, who also did a X-ray. She reports that she is getting a cold and she has a cough and sore throat. She went to [place] last week and was around sick people on a bus. She usually stays busy with activities at her church. She walks about 2,500-3,000 steps per day. She denies unusual headaches, visual changes, nausea, vomiting, or dizziness. There has been no unusual cough, phlegm production, or pleurisy. There has been no change in bowel or bladder habits. She denies unexplained fatigue or unexplained weight loss,  bleeding, rash, or fever. A detailed review of systems was otherwise stable.    BREAST CANCER HISTORY: From the earlier summary notes:  Jade Mooney was referred to Dr. Clarene Mooney for evaluation of abdominal discomfort and nausea. Exam and blood work including liver function tests was unremarkable aside from normochromic normocytic anemia. Cholecystitis was suspected and the patient underwent endoscopy followed by abdominal/ pelvic CT scan on 09/29/2013. This showed showed a normal gallbladder. Incidental findings included multiple simple cysts in the liver and aortic atherosclerosis. However mixed lytic and sclerotic bony lesions were noted. On 10/10/2013 the patient underwent biopsy of the right iliac bone, and this showed ([id number]) metastatic invasive ductal carcinoma, grade 2, estrogen and progesterone receptor strongly positive.  Jade Mooney has a remote history of breast cancer, which we tried to reconstruct. She was originally diagnosed early in 1997 with stage III disease, and underwent right mastectomy followed by adjuvant chemotherapy. She then participated in a [facility] protocol with high-dose chemotherapy followed by stem cell rescue 12/22/1995. Unfortunately later that year liver lesions were noted and were biopsy-proven to be metastatic breast cancer. This was not felt to be resectable. However the tumor was estrogen receptor positive and HER-2 positive. The patient was treated with Herceptin (she does not know for what period of time) and was then on tamoxifen until November of 2002. She also participated in a vaccine study at [facility].  Jade Mooney's subsequent history is as detailed below   PAST MEDICAL HISTORY: Past Medical History:  Diagnosis Date  . Anxiety   . Cancer [facility])    Breast 1997 right tx with mastectomy and chemo, metastatic now  . GERD (gastroesophageal reflux disease)   . Hypercholesterolemia   .  Hypertension   . Hypothyroidism   . Osteoarthritis    oa  . Personal history of  chemotherapy   . Personal history of radiation therapy   . TIA (transient ischemic attack) last 11-08-15   x 2 total    PAST SURGICAL HISTORY: Past Surgical History:  Procedure Laterality Date  . CATARACT EXTRACTION, BILATERAL    . MASTECTOMY MODIFIED RADICAL Right 1997  . porta cath insertion  1997   later removed  . radiation tx  finished 02-25-16   x 20 tx  . TONSILLECTOMY    . TOTAL HIP ARTHROPLASTY Left 03/12/2016   Procedure: LEFT TOTAL HIP ARTHROPLASTY ANTERIOR APPROACH;  Surgeon: Jade Arabian, MD;  Location: [facility];  Service: Orthopedics;  Laterality: Left;  . TOTAL KNEE ARTHROPLASTY Left   . TUBAL LIGATION      FAMILY HISTORY Family History  Problem Relation Age of Onset  . Breast cancer Maternal Aunt    The patient's father died at the age of 23 from heart disease. The patient's mother died at the age of 38. The patient had no brothers, one sister. One of the patient's mother's 5 sisters was diagnosed with breast cancer in her 22s  GYNECOLOGIC HISTORY:  No LMP recorded. Patient is postmenopausal. Menarche age 11, the patient is GX P0. She stopped having periods approximately 1994. She used hormone replacement until 1997. 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 Her husband Jade Mooney (goes by "Jade Mooney" was [employer]. of information all services at [employer]. They live alone, with no pets.   ADVANCED DIRECTIVES: In place   HEALTH MAINTENANCE: Social History   Tobacco Use  . Smoking status: Never Smoker  . Smokeless tobacco: Never Used  Substance Use Topics  . Alcohol use: Yes    Comment: 1 glass wine 4-5 x week  . Drug use: No     Colonoscopy: 2013  PAP: 2010  Bone density: 2012  Lipid panel:  Allergies  Allergen Reactions  . Amoxicillin Hives    Has patient had a PCN reaction causing immediate rash, facial/tongue/throat swelling, SOB or lightheadedness  with hypotension:unsure Has patient had a PCN reaction causing severe rash involving mucus membranes or skin necrosis:No Has patient had a PCN reaction that required hospitalization:No Has patient had a PCN reaction occurring within the last 10 years: Yes If all of the above answers are "NO", then may proceed with Cephalosporin use.   Marland Kitchen Percocet [Oxycodone-Acetaminophen] Nausea And Vomiting  . Percodan [Oxycodone-Aspirin] Nausea And Vomiting  . Tramadol Itching    Current Outpatient Medications  Medication Sig Dispense Refill  . acetaminophen (TYLENOL) 500 MG tablet Take 1,000 mg by mouth every 6 (six) hours as needed for mild pain. Reported on 04/17/2015    . clopidogrel (PLAVIX) 75 MG tablet Take 1 tablet (75 mg total) by mouth daily.    . cyanocobalamin (,VITAMIN B-12,) 1000 MCG/ML injection Inject 1,000 mcg into the muscle once.    Marland Kitchen levothyroxine (SYNTHROID, LEVOTHROID) 88 MCG tablet Take 88 mcg by mouth daily before breakfast.    . Lutein 6 MG TABS Take 6 mg by mouth 2 (two) times daily.     . magnesium gluconate (MAGONATE) 500 MG tablet Take 500 mg by mouth at bedtime.    . meclizine (ANTIVERT) 25 MG tablet Take 25 mg by mouth 3 (three) times daily as needed for dizziness.    Marland Kitchen METRONIDAZOLE,  TOPICAL, 0.75 % LOTN Apply 1 application topically at bedtime. Apply to face for rosacea    . palbociclib (IBRANCE) 75 MG capsule Take 1 capsule (75 mg total) by mouth daily with breakfast. Take whole with food on days 1-21, every 28 days. (Patient taking differently: Take 75 mg by mouth every other day. Take every other day with food on days 1-21, every 28 days.) 21 capsule 2  . pantoprazole (PROTONIX) 40 MG tablet Take 40 mg by mouth 2 (two) times daily. Before breakfast and before supper    . Polyethyl Glycol-Propyl Glycol (SYSTANE ULTRA) 0.4-0.3 % SOLN Place 1-2 drops into both eyes 2 (two) times daily.    . polyethylene glycol (MIRALAX / GLYCOLAX) packet Take 4 g by mouth daily. 1 teaspoonful  in the morning    . rOPINIRole (REQUIP) 0.5 MG tablet Take 0.5 mg by mouth 3 (three) times daily.    . simvastatin (ZOCOR) 40 MG tablet Take 40 mg by mouth at bedtime.     . traZODone (DESYREL) 50 MG tablet Take 25 mg by mouth at bedtime.      No current facility-administered medications for this visit.     OBJECTIVE: Older white woman in no acute distress  Vitals:   11/24/17 1139  BP: (!) 157/86  Pulse: 73  Resp: 18  Temp: 99.3 F (37.4 C)  SpO2: 100%     Body mass index is 29.47 kg/m.    ECOG FS:1 - Symptomatic but completely ambulatory  Sclerae unicteric, EOMs intact Oropharynx clear, dry No cervical or supraclavicular adenopathy Lungs no rales or rhonchi Heart regular rate and rhythm Abd soft, nontender, positive bowel sounds MSK no focal spinal tenderness, no upper extremity lymphedema Neuro: nonfocal, well oriented, appropriate affect Breasts: Right breast is status post mastectomy with no evidence of disease recurrence.  Left breast is unremarkable.  Both axillae are benign. Skin: No suspicious scalp or skin lesions noted  LAB RESULTS: CMP     Component Value Date/Time   NA 138 11/24/2017 1101   NA 138 02/09/2017 1213   K 4.0 11/24/2017 1101   K 3.9 02/09/2017 1213   CL 103 11/24/2017 1101   CO2 24 11/24/2017 1101   CO2 27 02/09/2017 1213   GLUCOSE 102 (H) 11/24/2017 1101   GLUCOSE 100 02/09/2017 1213   BUN 12 11/24/2017 1101   BUN 12.0 02/09/2017 1213   CREATININE 0.85 11/24/2017 1101   CREATININE 0.9 02/09/2017 1213   CALCIUM 9.4 11/24/2017 1101   CALCIUM 8.7 02/09/2017 1213   PROT 7.0 11/24/2017 1101   PROT 6.5 02/09/2017 1213   ALBUMIN 4.5 11/24/2017 1101   ALBUMIN 4.1 02/09/2017 1213   AST 20 11/24/2017 1101   AST 24 02/09/2017 1213   ALT 15 11/24/2017 1101   ALT 18 02/09/2017 1213   ALKPHOS 62 11/24/2017 1101   ALKPHOS 62 02/09/2017 1213   BILITOT 0.6 11/24/2017 1101   BILITOT 0.34 02/09/2017 1213   GFRNONAA >60 11/24/2017 1101   GFRAA >60  11/24/2017 1101    I No results found for: SPEP  Lab Results  Component Value Date   WBC 5.9 11/24/2017   NEUTROABS 4.9 11/24/2017   HGB 13.2 11/24/2017   HCT 39.7 11/24/2017   MCV 91.5 11/24/2017   PLT 205 11/24/2017      Chemistry      Component Value Date/Time   NA 138 11/24/2017 1101   NA 138 02/09/2017 1213   K 4.0 11/24/2017 1101   K 3.9  02/09/2017 1213   CL 103 11/24/2017 1101   CO2 24 11/24/2017 1101   CO2 27 02/09/2017 1213   BUN 12 11/24/2017 1101   BUN 12.0 02/09/2017 1213   CREATININE 0.85 11/24/2017 1101   CREATININE 0.9 02/09/2017 1213      Component Value Date/Time   CALCIUM 9.4 11/24/2017 1101   CALCIUM 8.7 02/09/2017 1213   ALKPHOS 62 11/24/2017 1101   ALKPHOS 62 02/09/2017 1213   AST 20 11/24/2017 1101   AST 24 02/09/2017 1213   ALT 15 11/24/2017 1101   ALT 18 02/09/2017 1213   BILITOT 0.6 11/24/2017 1101   BILITOT 0.34 02/09/2017 1213      No results for input(s): INR in the last 168 hours.  Urinalysis    Component Value Date/Time   COLORURINE STRAW (A) 03/05/2016 1350    STUDIES:  No results found.   ASSESSMENT: 72 y.o. [place] woman with stage IV right breast cancer as of NOV 2007 (bone metastases, subcutaneous nodules)  (1) s/p Right mastectomy 1997 for an estrogen receptor and HER-2 positive breast cancer, treated with  (a) adjuvant chemotherapy according to CALGB 9640 (taxotere x 4 cycles)  (b) high-dose chemotherapy (cyclophosphamide, carboplatin, BCNU) followed by stem cell rescue at [facility] 12/22/1995  (c) biopsy-proven metastatic disease November 2007, estrogen receptor and HER-2 positive  (d) trastuzumab (dose? Duration?)  (e) tamoxifen for 5 years completed 2002  (f) participation in a vaccine study at [facility] 2003 (CEA primed dendritic cells, HER-2 primed dendritic cells)  (2) status post right iliac crest biopsy 10/10/2013 for invasive ductal carcinoma, grade 2, estrogen and progesterone receptor positive  (3)  zolendronate started 10/12/2013,  repeated every 12 weeks  (a) dexa scan 07/28/2013 normal  (b) zolendronate discontinued after January 2019 dose  (4) biopsy of a scalp lesion 10/17/2013 showed metastatic breast cancer, HER-2/neu negative  (a) biopsy of a right anterior chest wall nodule August 2017 shows adenocarcinoma  (5) anastrozole started 10/28/2013, changed to letrozole 06/27/2016  (a) measurable disease = subcutaneous nodules in scalp and LLQ abdomen  (b)  CA-27-29 is informative  (c)  Repeat PET scan 12/28/2014 shows continuing response ; exam shows resolution of scalp lesions  (d) new skin lesion removed from right anterior chest wall August 2017  (e) status post radiation to the anterior lower right chest wall completed 02/25/2017 (20 sessions)   (f) PET scan 04/04/2016 shows 2 new foci of metabolic activity (T8 and manubrium)  (g) palbociclib added 04/21/2016 at 125 mg 21/7  (h) palbociclib dose dropped to 100 mg 21/7 starting with the March 2018 cycle  (i) bone scan 09/26/2016 is stable  (j) PET scan 02/03/2017 shows her bone metastases, and in addition a 0.5 cm right middle lobe nodule which may be new  (k) MRI of the cervical spine shows collapse of the left lateral mass of C3.  There is no soft tissue mass or focal neural impingement.  (l) palbociclib reduced to 75 mg as of September 2018  (m) palbociclib held during cervical spine irradiation finished in mid February, 2019  (n) letrozole discontinued February 2019 secondary to likely progression    (6) neoplasia associated pain  (a) painful blastic lesion left femoral intertrochanteric region: Status post radiation completed November 2015  (b) pain left knee, status post TKR remotely  (c) left total hip replacement 03/12/2016  (7) palliative radiation to the cervical spine completed 05/08/2017 in   (8) fulvestrant started 05/04/2017  (a) Palbociclib resumed 06/29/2017  (9) denosumab/Xgeva started 06/01/2017,  repeated every 28 days  (10) follow-up/staging studies  (a) PET scan 08/11/2017 shows no evidence for metabolically active tumor.  Sclerotic bone mets do not show hypermetabolism  PLAN:  Jade Mooney is now 12 years out from definitive diagnosis of metastatic breast cancer with no evidence of disease activity.  This is favorable.  She is tolerating her current treatments well.  Her ANC today is encouragingly good.  She is having a little bit of a "cold" she which she probably picked up from her main trip, but no fever so far.  If she does develop a temperature of 100 or greater she will stop the [place] and then we or Dr. Nicki Mooney can deal with the infection.  Right now however we are continuing as before.  She is going to return to see me just before Thanksgiving.  Shortly before that visit, after she returns from her next cruise, she will have a restaging PET scan.  I reviewed the foot x-rays obtained by Dr. Nicki Mooney.  I do think all she has is arthritis.  We talked about "warming up the foot" before she gets out of bed in the morning or after she has been sitting for long time.  It is reassuring that she has no pain while walking  She knows to call for any other issues that may develop before the next visit.   Alric Geise, Jade Dad, MD  11/24/17 12:06 PM Medical Oncology and Hematology [facility] [address] [place], [place] [address] Tel. [phone]    Fax. [phone]  Alice Rieger, am acting as scribe for Chauncey Cruel MD.  I, Lurline Del MD, have reviewed the above documentation for accuracy and completeness, and I agree with the above.

## 2017-11-24 ENCOUNTER — Inpatient Hospital Stay: Payer: Medicare Other | Attending: Oncology | Admitting: Oncology

## 2017-11-24 ENCOUNTER — Telehealth: Payer: Self-pay | Admitting: Pharmacist

## 2017-11-24 ENCOUNTER — Inpatient Hospital Stay: Payer: Medicare Other

## 2017-11-24 ENCOUNTER — Telehealth: Payer: Self-pay | Admitting: Oncology

## 2017-11-24 VITALS — BP 157/86 | HR 73 | Temp 99.3°F | Resp 18 | Ht 63.5 in | Wt 169.0 lb

## 2017-11-24 DIAGNOSIS — Z5111 Encounter for antineoplastic chemotherapy: Secondary | ICD-10-CM | POA: Diagnosis not present

## 2017-11-24 DIAGNOSIS — Z170 Estrogen receptor positive status [ER+]: Secondary | ICD-10-CM

## 2017-11-24 DIAGNOSIS — Z96642 Presence of left artificial hip joint: Secondary | ICD-10-CM | POA: Diagnosis not present

## 2017-11-24 DIAGNOSIS — C50919 Malignant neoplasm of unspecified site of unspecified female breast: Secondary | ICD-10-CM

## 2017-11-24 DIAGNOSIS — C50811 Malignant neoplasm of overlapping sites of right female breast: Secondary | ICD-10-CM | POA: Diagnosis not present

## 2017-11-24 DIAGNOSIS — C50912 Malignant neoplasm of unspecified site of left female breast: Secondary | ICD-10-CM

## 2017-11-24 DIAGNOSIS — C7951 Secondary malignant neoplasm of bone: Principal | ICD-10-CM

## 2017-11-24 DIAGNOSIS — C50911 Malignant neoplasm of unspecified site of right female breast: Secondary | ICD-10-CM

## 2017-11-24 LAB — COMPREHENSIVE METABOLIC PANEL
ALT: 15 U/L (ref 0–44)
AST: 20 U/L (ref 15–41)
Albumin: 4.5 g/dL (ref 3.5–5.0)
Alkaline Phosphatase: 62 U/L (ref 38–126)
Anion gap: 11 (ref 5–15)
BUN: 12 mg/dL (ref 8–23)
CO2: 24 mmol/L (ref 22–32)
Calcium: 9.4 mg/dL (ref 8.9–10.3)
Chloride: 103 mmol/L (ref 98–111)
Creatinine, Ser: 0.85 mg/dL (ref 0.44–1.00)
GFR calc Af Amer: >60 mL/min (ref >60)
GFR calc non Af Amer: >60 mL/min (ref >60)
Glucose, Bld: 102 mg/dL — ABNORMAL HIGH (ref 70–99)
Potassium: 4 mmol/L (ref 3.5–5.1)
Sodium: 138 mmol/L (ref 135–145)
Total Bilirubin: 0.6 mg/dL (ref 0.3–1.2)
Total Protein: 7 g/dL (ref 6.5–8.1)

## 2017-11-24 LAB — CBC WITH DIFFERENTIAL/PLATELET
Basophils Absolute: 0 10*3/uL (ref 0.0–0.1)
Basophils Relative: 1 %
Eosinophils Absolute: 0 10*3/uL (ref 0.0–0.5)
Eosinophils Relative: 1 %
HCT: 39.7 % (ref 34.8–46.6)
Hemoglobin: 13.2 g/dL (ref 11.6–15.9)
Lymphocytes Relative: 8 %
Lymphs Abs: 0.5 10*3/uL — ABNORMAL LOW (ref 0.9–3.3)
MCH: 30.5 pg (ref 25.1–34.0)
MCHC: 33.3 g/dL (ref 31.5–36.0)
MCV: 91.5 fL (ref 79.5–101.0)
Monocytes Absolute: 0.4 10*3/uL (ref 0.1–0.9)
Monocytes Relative: 7 %
Neutro Abs: 4.9 10*3/uL (ref 1.5–6.5)
Neutrophils Relative %: 83 %
Platelets: 205 10*3/uL (ref 145–400)
RBC: 4.34 MIL/uL (ref 3.70–5.45)
RDW: 15.2 % — ABNORMAL HIGH (ref 11.2–14.5)
WBC: 5.9 10*3/uL (ref 3.9–10.3)

## 2017-11-24 MED ORDER — DENOSUMAB 120 MG/1.7ML ~~LOC~~ SOLN
120.0000 mg | Freq: Once | SUBCUTANEOUS | Status: AC
  Administered 2017-11-24: 120 mg via SUBCUTANEOUS

## 2017-11-24 MED ORDER — FULVESTRANT 250 MG/5ML IM SOLN
500.0000 mg | Freq: Once | INTRAMUSCULAR | Status: AC
  Administered 2017-11-24: 500 mg via INTRAMUSCULAR

## 2017-11-24 NOTE — Patient Instructions (Signed)
Fulvestrant injection What is this medicine? FULVESTRANT (ful VES trant) blocks the effects of estrogen. It is used to treat breast cancer. This medicine may be used for other purposes; ask your health care provider or pharmacist if you have questions. COMMON BRAND NAME(S): FASLODEX What should I tell my health care provider before I take this medicine? They need to know if you have any of these conditions: -bleeding problems -liver disease -low levels of platelets in the blood -an unusual or allergic reaction to fulvestrant, other medicines, foods, dyes, or preservatives -pregnant or trying to get pregnant -breast-feeding How should I use this medicine? This medicine is for injection into a muscle. It is usually given by a health care professional in a hospital or clinic setting. Talk to your pediatrician regarding the use of this medicine in children. Special care may be needed. Overdosage: If you think you have taken too much of this medicine contact a poison control center or emergency room at once. NOTE: This medicine is only for you. Do not share this medicine with others. What if I miss a dose? It is important not to miss your dose. Call your doctor or health care professional if you are unable to keep an appointment. What may interact with this medicine? -medicines that treat or prevent blood clots like warfarin, enoxaparin, and dalteparin This list may not describe all possible interactions. Give your health care provider a list of all the medicines, herbs, non-prescription drugs, or dietary supplements you use. Also tell them if you smoke, drink alcohol, or use illegal drugs. Some items may interact with your medicine. What should I watch for while using this medicine? Your condition will be monitored carefully while you are receiving this medicine. You will need important blood work done while you are taking this medicine. Do not become pregnant while taking this medicine or for  at least 1 year after stopping it. Women of child-bearing potential will need to have a negative pregnancy test before starting this medicine. Women should inform their doctor if they wish to become pregnant or think they might be pregnant. There is a potential for serious side effects to an unborn child. Men should inform their doctors if they wish to father a child. This medicine may lower sperm counts. Talk to your health care professional or pharmacist for more information. Do not breast-feed an infant while taking this medicine or for 1 year after the last dose. What side effects may I notice from receiving this medicine? Side effects that you should report to your doctor or health care professional as soon as possible: -allergic reactions like skin rash, itching or hives, swelling of the face, lips, or tongue -feeling faint or lightheaded, falls -pain, tingling, numbness, or weakness in the legs -signs and symptoms of infection like fever or chills; cough; flu-like symptoms; sore throat -vaginal bleeding Side effects that usually do not require medical attention (report to your doctor or health care professional if they continue or are bothersome): -aches, pains -constipation -diarrhea -headache -hot flashes -nausea, vomiting -pain at site where injected -stomach pain This list may not describe all possible side effects. Call your doctor for medical advice about side effects. You may report side effects to FDA at 1-800-FDA-1088. Where should I keep my medicine? This drug is given in a hospital or clinic and will not be stored at home. NOTE: This sheet is a summary. It may not cover all possible information. If you have questions about this medicine, talk to your   doctor, pharmacist, or health care provider.  2018 [employer]/Gold Standard (2014-10-06 11:03:55) Denosumab injection What is this medicine? DENOSUMAB (den oh sue mab) slows bone breakdown. Prolia is used to treat osteoporosis in  women after menopause and in men. Delton See is used to treat a high calcium level due to cancer and to prevent bone fractures and other bone problems caused by multiple myeloma or cancer bone metastases. Delton See is also used to treat giant cell tumor of the bone. This medicine may be used for other purposes; ask your health care provider or pharmacist if you have questions. COMMON BRAND NAME(S): Prolia, XGEVA What should I tell my health care provider before I take this medicine? They need to know if you have any of these conditions: -dental disease -having surgery or tooth extraction -infection -kidney disease -low levels of calcium or Vitamin D in the blood -malnutrition -on hemodialysis -skin conditions or sensitivity -thyroid or parathyroid disease -an unusual reaction to denosumab, other medicines, foods, dyes, or preservatives -pregnant or trying to get pregnant -breast-feeding How should I use this medicine? This medicine is for injection under the skin. It is given by a health care professional in a hospital or clinic setting. If you are getting Prolia, a special MedGuide will be given to you by the pharmacist with each prescription and refill. Be sure to read this information carefully each time. For Prolia, talk to your pediatrician regarding the use of this medicine in children. Special care may be needed. For Delton See, talk to your pediatrician regarding the use of this medicine in children. While this drug may be prescribed for children as young as 13 years for selected conditions, precautions do apply. Overdosage: If you think you have taken too much of this medicine contact a poison control center or emergency room at once. NOTE: This medicine is only for you. Do not share this medicine with others. What if I miss a dose? It is important not to miss your dose. Call your doctor or health care professional if you are unable to keep an appointment. What may interact with this  medicine? Do not take this medicine with any of the following medications: -other medicines containing denosumab This medicine may also interact with the following medications: -medicines that lower your chance of fighting infection -steroid medicines like prednisone or cortisone This list may not describe all possible interactions. Give your health care provider a list of all the medicines, herbs, non-prescription drugs, or dietary supplements you use. Also tell them if you smoke, drink alcohol, or use illegal drugs. Some items may interact with your medicine. What should I watch for while using this medicine? Visit your doctor or health care professional for regular checks on your progress. Your doctor or health care professional may order blood tests and other tests to see how you are doing. Call your doctor or health care professional for advice if you get a fever, chills or sore throat, or other symptoms of a cold or flu. Do not treat yourself. This drug may decrease your body's ability to fight infection. Try to avoid being around people who are sick. You should make sure you get enough calcium and vitamin D while you are taking this medicine, unless your doctor tells you not to. Discuss the foods you eat and the vitamins you take with your health care professional. See your dentist regularly. Brush and floss your teeth as directed. Before you have any dental work done, tell your dentist you are receiving this medicine. Do  not become pregnant while taking this medicine or for 5 months after stopping it. Talk with your doctor or health care professional about your birth control options while taking this medicine. Women should inform their doctor if they wish to become pregnant or think they might be pregnant. There is a potential for serious side effects to an unborn child. Talk to your health care professional or pharmacist for more information. What side effects may I notice from receiving this  medicine? Side effects that you should report to your doctor or health care professional as soon as possible: -allergic reactions like skin rash, itching or hives, swelling of the face, lips, or tongue -bone pain -breathing problems -dizziness -jaw pain, especially after dental work -redness, blistering, peeling of the skin -signs and symptoms of infection like fever or chills; cough; sore throat; pain or trouble passing urine -signs of low calcium like fast heartbeat, muscle cramps or muscle pain; pain, tingling, numbness in the hands or feet; seizures -unusual bleeding or bruising -unusually weak or tired Side effects that usually do not require medical attention (report to your doctor or health care professional if they continue or are bothersome): -constipation -diarrhea -headache -joint pain -loss of appetite -muscle pain -runny nose -tiredness -upset stomach This list may not describe all possible side effects. Call your doctor for medical advice about side effects. You may report side effects to FDA at 1-800-FDA-1088. Where should I keep my medicine? This medicine is only given in a clinic, doctor's office, or other health care setting and will not be stored at home. NOTE: This sheet is a summary. It may not cover all possible information. If you have questions about this medicine, talk to your doctor, pharmacist, or health care provider.  2018 [employer]/Gold Standard (2016-04-01 19:17:21)  

## 2017-11-24 NOTE — Telephone Encounter (Signed)
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 in combination withFaslodexuntil disease progression or unacceptable toxicity  Original Start date of oral chemotherapy: 04/21/2016 Leslee Home re-start: 06/29/2017 on1 capsule (75 mg) by mouth once daily with whole food, take for 21 days on, 7 days off, repeat every 28 days  Dose changed to 1 capsule (75 mg) by mouth once every other day with whole food, take for 21 days on, 7 days off, repeat every 28 days on 08/03/2017  Pt reports 0 tablets/doses of Ibrance '75mg'$  caspules, 1 capsule (75 mg) by mouth once every other day with whole food, take for 21 days on, 7 days off, repeat every 28 days, missed in the last month.   Pt reports the following side effects: mild, manageable fatigue  Pertinent labs reviewed: OK for continued treatment.   Dispensed samples to patient:  Medication: Ibrance '75mg'$  capsules Instructions: Take  1 capsule (75 mg) by mouth once every other day with whole food, take for 21 days on, 7 days off, repeat every 28 days Quantity dispensed: 42 Days supply: 106 Manufacturer: [place] Lot: [id number] Exp: 08/2018   Patient knows to call the office with questions or concerns. Oral Oncology Clinic will continue to follow.  Johny Drilling, PharmD, BCPS, BCOP  11/24/2017 12:54 PM Oral Oncology Clinic [phone]

## 2017-11-24 NOTE — Telephone Encounter (Signed)
Gave patient avs and calendar. PET scan referral noted.

## 2017-11-25 LAB — CANCER ANTIGEN 27.29: CA 27.29: 58.8 U/mL — ABNORMAL HIGH (ref 0.0–38.6)

## 2017-11-27 DIAGNOSIS — H1033 Unspecified acute conjunctivitis, bilateral: Secondary | ICD-10-CM | POA: Diagnosis not present

## 2017-11-27 DIAGNOSIS — J0100 Acute maxillary sinusitis, unspecified: Secondary | ICD-10-CM | POA: Diagnosis not present

## 2017-12-04 DIAGNOSIS — D510 Vitamin B12 deficiency anemia due to intrinsic factor deficiency: Secondary | ICD-10-CM | POA: Diagnosis not present

## 2017-12-14 DIAGNOSIS — J329 Chronic sinusitis, unspecified: Secondary | ICD-10-CM | POA: Diagnosis not present

## 2017-12-14 DIAGNOSIS — Z23 Encounter for immunization: Secondary | ICD-10-CM | POA: Diagnosis not present

## 2017-12-14 DIAGNOSIS — J40 Bronchitis, not specified as acute or chronic: Secondary | ICD-10-CM | POA: Diagnosis not present

## 2017-12-14 DIAGNOSIS — Z6829 Body mass index (BMI) 29.0-29.9, adult: Secondary | ICD-10-CM | POA: Diagnosis not present

## 2017-12-22 DIAGNOSIS — C50919 Malignant neoplasm of unspecified site of unspecified female breast: Secondary | ICD-10-CM | POA: Diagnosis not present

## 2017-12-22 DIAGNOSIS — Z5111 Encounter for antineoplastic chemotherapy: Secondary | ICD-10-CM | POA: Diagnosis not present

## 2017-12-22 DIAGNOSIS — C7951 Secondary malignant neoplasm of bone: Secondary | ICD-10-CM | POA: Diagnosis not present

## 2017-12-31 DIAGNOSIS — E039 Hypothyroidism, unspecified: Secondary | ICD-10-CM | POA: Diagnosis not present

## 2017-12-31 DIAGNOSIS — D510 Vitamin B12 deficiency anemia due to intrinsic factor deficiency: Secondary | ICD-10-CM | POA: Diagnosis not present

## 2017-12-31 DIAGNOSIS — E782 Mixed hyperlipidemia: Secondary | ICD-10-CM | POA: Diagnosis not present

## 2017-12-31 DIAGNOSIS — Z79899 Other long term (current) drug therapy: Secondary | ICD-10-CM | POA: Diagnosis not present

## 2018-01-19 DIAGNOSIS — Z5111 Encounter for antineoplastic chemotherapy: Secondary | ICD-10-CM | POA: Diagnosis not present

## 2018-01-19 DIAGNOSIS — C7951 Secondary malignant neoplasm of bone: Secondary | ICD-10-CM | POA: Diagnosis not present

## 2018-01-19 DIAGNOSIS — C50919 Malignant neoplasm of unspecified site of unspecified female breast: Secondary | ICD-10-CM | POA: Diagnosis not present

## 2018-01-25 DIAGNOSIS — H353131 Nonexudative age-related macular degeneration, bilateral, early dry stage: Secondary | ICD-10-CM | POA: Diagnosis not present

## 2018-01-25 DIAGNOSIS — H524 Presbyopia: Secondary | ICD-10-CM | POA: Diagnosis not present

## 2018-01-25 DIAGNOSIS — M25562 Pain in left knee: Secondary | ICD-10-CM | POA: Diagnosis not present

## 2018-01-26 DIAGNOSIS — D510 Vitamin B12 deficiency anemia due to intrinsic factor deficiency: Secondary | ICD-10-CM | POA: Diagnosis not present

## 2018-02-10 DIAGNOSIS — M25562 Pain in left knee: Secondary | ICD-10-CM | POA: Diagnosis not present

## 2018-02-10 DIAGNOSIS — Z96652 Presence of left artificial knee joint: Secondary | ICD-10-CM | POA: Diagnosis not present

## 2018-02-11 ENCOUNTER — Ambulatory Visit (HOSPITAL_COMMUNITY)
Admission: RE | Admit: 2018-02-11 | Discharge: 2018-02-11 | Disposition: A | Discharge status: Home / Self Care | Payer: Medicare Other | Source: Ambulatory Visit | Attending: Oncology | Admitting: Oncology

## 2018-02-11 DIAGNOSIS — C50919 Malignant neoplasm of unspecified site of unspecified female breast: Secondary | ICD-10-CM | POA: Insufficient documentation

## 2018-02-11 DIAGNOSIS — C50811 Malignant neoplasm of overlapping sites of right female breast: Secondary | ICD-10-CM | POA: Diagnosis not present

## 2018-02-11 DIAGNOSIS — Z170 Estrogen receptor positive status [ER+]: Secondary | ICD-10-CM | POA: Insufficient documentation

## 2018-02-11 DIAGNOSIS — C50911 Malignant neoplasm of unspecified site of right female breast: Secondary | ICD-10-CM | POA: Diagnosis not present

## 2018-02-11 DIAGNOSIS — C7951 Secondary malignant neoplasm of bone: Secondary | ICD-10-CM | POA: Insufficient documentation

## 2018-02-11 LAB — GLUCOSE, CAPILLARY: Glucose-Capillary: 110 mg/dL — ABNORMAL HIGH (ref 70–99)

## 2018-02-11 MED ORDER — FLUDEOXYGLUCOSE F - 18 (FDG) INJECTION
8.4700 | Freq: Once | INTRAVENOUS | Status: AC | PRN
  Administered 2018-02-11: 8.47 via INTRAVENOUS

## 2018-02-11 NOTE — Progress Notes (Signed)
[facility]  [phone] [phone]   ID: Jade Mooney DOB: [birth year]  MR#: [id number]  GBT#:[id number]  Patient Care Team: Street, Sharon Mt, MD as PCP - General (Family Medicine) Clarene Essex, MD as Consulting Physician (Gastroenterology) Donie Lemelin, Virgie Dad, MD as Consulting Physician (Oncology) Gaynelle Arabian, MD as Consulting Physician (Orthopedic Surgery) Gatha Mayer, MD as Consulting Physician (Radiation Oncology) Kristeen Miss, MD as Consulting Physician (Neurosurgery)  CHIEF COMPLAINT: Newly diagnosed metastatic breast cancer  CURRENT TREATMENT: fulvestrant, denosumab/Xgeva, palbociclib  INTERVAL HISTORY: Jade Mooney returns today for follow-up and treatment of her estrogen receptor positive breast cancer. She is accompanied by her husband.  The patient continues on palbociclib, and is tolerating it well overall.   Every 4 weeks she receives fulvestrant in [place]. She gets sore from the shots, with side effects lasting upwards of 5 days shot location, but she experiences no bleeding or bruising.  She also receives denosumab/ Xgeva every 4 weeks as well, andshe does get some bruising. She is taking calcium daily, which also of course covers the day of treatment.    Since her last visit here, she underwent a PET on 02/11/2018, showing no metabolically active metastatic disease. Stable CT appearance of widespread sclerotic osseous metastases, without osseous hypermetabolism. Aortic Atherosclerosis (ICD10-I70.0).   REVIEW OF SYSTEMS: Jade Mooney is doing well overall. She has been working at [employer], and has been active overall. She just returned from a cruise to the [place]. She expresses that it was hot, but that she had a good time. She expressed that she is no longer attending seasonal baseball games due to the heat. She feels like her legs are jumpy a little at night. She is having some difficulty with her knee, one that  previously had a knee replacement, and was told that she has weak muscles. So she is attending physical therapy to build her strength. Her goal is to get back to the gym for exercise. The patient denies unusual headaches, visual changes, nausea, vomiting, or dizziness. There has been no unusual cough, phlegm production, or pleurisy. This been no change in bowel or bladder habits. The patient denies unexplained fatigue or unexplained weight loss, bleeding, rash, or fever. A detailed review of systems was otherwise noncontributory.    BREAST CANCER HISTORY: From the earlier summary notes:  Jade Mooney was referred to Dr. Clarene Essex for evaluation of abdominal discomfort and nausea. Exam and blood work including liver function tests was unremarkable aside from normochromic normocytic anemia. Cholecystitis was suspected and the patient underwent endoscopy followed by abdominal/ pelvic CT scan on 09/29/2013. This showed showed a normal gallbladder. Incidental findings included multiple simple cysts in the liver and aortic atherosclerosis. However mixed lytic and sclerotic bony lesions were noted. On 10/10/2013 the patient underwent biopsy of the right iliac bone, and this showed ([id number]) metastatic invasive ductal carcinoma, grade 2, estrogen and progesterone receptor strongly positive.  Jade Mooney has a remote history of breast cancer, which we tried to reconstruct. She was originally diagnosed early in 1997 with stage III disease, and underwent right mastectomy followed by adjuvant chemotherapy. She then participated in a [facility] protocol with high-dose chemotherapy followed by stem cell rescue 12/22/1995. Unfortunately later that year liver lesions were noted and were biopsy-proven to be metastatic breast cancer. This was not felt to be resectable. However the tumor was estrogen receptor positive and HER-2 positive. The patient was treated with Herceptin (she does not know for what period of time) and was then on  tamoxifen until November of 2002. She also participated in a vaccine study at [facility].  [facility] subsequent history is as detailed below   PAST MEDICAL HISTORY: Past Medical History:  Diagnosis Date  . Anxiety   . Cancer [facility])    Breast 1997 right tx with mastectomy and chemo, metastatic now  . GERD (gastroesophageal reflux disease)   . Hypercholesterolemia   . Hypertension   . Hypothyroidism   . Osteoarthritis    oa  . Personal history of chemotherapy   . Personal history of radiation therapy   . TIA (transient ischemic attack) last 11-08-15   x 2 total    PAST SURGICAL HISTORY: Past Surgical History:  Procedure Laterality Date  . CATARACT EXTRACTION, BILATERAL    . MASTECTOMY MODIFIED RADICAL Right 1997  . porta cath insertion  1997   later removed  . radiation tx  finished 02-25-16   x 20 tx  . TONSILLECTOMY    . TOTAL HIP ARTHROPLASTY Left 03/12/2016   Procedure: LEFT TOTAL HIP ARTHROPLASTY ANTERIOR APPROACH;  Surgeon: Gaynelle Arabian, MD;  Location: [facility];  Service: Orthopedics;  Laterality: Left;  . TOTAL KNEE ARTHROPLASTY Left   . TUBAL LIGATION      FAMILY HISTORY Family History  Problem Relation Age of Onset  . Breast cancer Maternal Aunt    The patient's father died at the age of 69 from heart disease. The patient's mother died at the age of 69. The patient had no brothers, one sister. One of the patient's mother's 5 sisters was diagnosed with breast cancer in her 70s  GYNECOLOGIC HISTORY:  No LMP recorded. Patient is postmenopausal. Menarche age 14, the patient is GX P0. She stopped having periods approximately 1994. She used hormone replacement until 1997. 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 Her husband [name] (goes by "[name]" was [employer]. of information all services at [employer]. They live alone, with no pets.   ADVANCED DIRECTIVES: In  place   HEALTH MAINTENANCE: Social History   Tobacco Use  . Smoking status: Never Smoker  . Smokeless tobacco: Never Used  Substance Use Topics  . Alcohol use: Yes    Comment: 1 glass wine 4-5 x week  . Drug use: No     Colonoscopy: 2013  PAP: 2010  Bone density: 2012  Lipid panel:  Allergies  Allergen Reactions  . Amoxicillin Hives    Has patient had a PCN reaction causing immediate rash, facial/tongue/throat swelling, SOB or lightheadedness with hypotension:unsure Has patient had a PCN reaction causing severe rash involving mucus membranes or skin necrosis:No Has patient had a PCN reaction that required hospitalization:No Has patient had a PCN reaction occurring within the last 10 years: Yes If all of the above answers are "NO", then may proceed with Cephalosporin use.   Marland Kitchen Percocet [Oxycodone-Acetaminophen] Nausea And Vomiting  . Percodan [Oxycodone-Aspirin] Nausea And Vomiting  . Tramadol Itching    Current Outpatient Medications  Medication Sig Dispense Refill  . acetaminophen (TYLENOL) 500 MG tablet Take 1,000 mg by mouth every 6 (six) hours as needed for mild pain. Reported on 04/17/2015    . calcium carbonate (OSCAL) 1500 (600 Ca) MG TABS tablet Take 600 mg of elemental calcium by mouth 2 (two) times daily with a meal.    . cholecalciferol (VITAMIN D) 1000 units tablet Take 1,000 Units by mouth daily.    . clopidogrel (  PLAVIX) 75 MG tablet Take 1 tablet (75 mg total) by mouth daily.    . cyanocobalamin (,VITAMIN B-12,) 1000 MCG/ML injection Inject 1,000 mcg into the muscle once.    Marland Kitchen levothyroxine (SYNTHROID, LEVOTHROID) 88 MCG tablet Take 88 mcg by mouth daily before breakfast.    . Lutein 6 MG TABS Take 6 mg by mouth 2 (two) times daily.     . magnesium gluconate (MAGONATE) 500 MG tablet Take 500 mg by mouth at bedtime.    . meclizine (ANTIVERT) 25 MG tablet Take 25 mg by mouth 3 (three) times daily as needed for dizziness.    Marland Kitchen METRONIDAZOLE, TOPICAL, 0.75 % LOTN  Apply 1 application topically at bedtime. Apply to face for rosacea    . palbociclib (IBRANCE) 75 MG capsule Take 1 capsule (75 mg total) by mouth daily with breakfast. Take whole with food on days 1-21, every 28 days. (Patient taking differently: Take 75 mg by mouth every other day. Take every other day with food on days 1-21, every 28 days.) 21 capsule 2  . pantoprazole (PROTONIX) 40 MG tablet Take 40 mg by mouth 2 (two) times daily. Before breakfast and before supper    . Polyethyl Glycol-Propyl Glycol (SYSTANE ULTRA) 0.4-0.3 % SOLN Place 1-2 drops into both eyes 2 (two) times daily.    . polyethylene glycol (MIRALAX / GLYCOLAX) packet Take 4 g by mouth daily. 1 teaspoonful in the morning    . rOPINIRole (REQUIP) 0.5 MG tablet Take 0.5 mg by mouth 3 (three) times daily.    . simvastatin (ZOCOR) 40 MG tablet Take 40 mg by mouth at bedtime.     . traZODone (DESYREL) 50 MG tablet Take 25 mg by mouth at bedtime.      No current facility-administered medications for this visit.     OBJECTIVE: Older white woman who appears stated age  45:   02/16/18 1141  BP: (!) 160/70  Pulse: (!) 59  Resp: 18  Temp: 97.8 F (36.6 C)  SpO2: 100%     Body mass index is 29.26 kg/m.    ECOG FS:1 - Symptomatic but completely ambulatory  Sclerae unicteric, pupils round and equal Oropharynx clear and moist No cervical or supraclavicular adenopathy Lungs no rales or rhonchi Heart regular rate and rhythm Abd soft, nontender, positive bowel sounds MSK no focal spinal tenderness, no upper extremity lymphedema Neuro: nonfocal, well oriented, appropriate affect Breasts: The right she is status post mastectomy and radiation.  There is some telangiectasias associated with the treatment.  There is no evidence of local recurrence.  The left breast is benign.  Both axillae are benign.  LAB RESULTS: CMP     Component Value Date/Time   NA 139 02/16/2018 1116   NA 138 02/09/2017 1213   K 4.2 02/16/2018 1116    K 3.9 02/09/2017 1213   CL 105 02/16/2018 1116   CO2 24 02/16/2018 1116   CO2 27 02/09/2017 1213   GLUCOSE 95 02/16/2018 1116   GLUCOSE 100 02/09/2017 1213   BUN 12 02/16/2018 1116   BUN 12.0 02/09/2017 1213   CREATININE 0.90 02/16/2018 1116   CREATININE 0.9 02/09/2017 1213   CALCIUM 9.3 02/16/2018 1116   CALCIUM 8.7 02/09/2017 1213   PROT 6.6 02/16/2018 1116   PROT 6.5 02/09/2017 1213   ALBUMIN 4.3 02/16/2018 1116   ALBUMIN 4.1 02/09/2017 1213   AST 25 02/16/2018 1116   AST 24 02/09/2017 1213   ALT 17 02/16/2018 1116   ALT 18  02/09/2017 1213   ALKPHOS 54 02/16/2018 1116   ALKPHOS 62 02/09/2017 1213   BILITOT 0.5 02/16/2018 1116   BILITOT 0.34 02/09/2017 1213   GFRNONAA >60 02/16/2018 1116   GFRAA >60 02/16/2018 1116    I No results found for: SPEP  Lab Results  Component Value Date   WBC 3.1 (L) 02/16/2018   NEUTROABS 1.7 02/16/2018   HGB 12.6 02/16/2018   HCT 38.1 02/16/2018   MCV 92.5 02/16/2018   PLT 196 02/16/2018      Chemistry      Component Value Date/Time   NA 139 02/16/2018 1116   NA 138 02/09/2017 1213   K 4.2 02/16/2018 1116   K 3.9 02/09/2017 1213   CL 105 02/16/2018 1116   CO2 24 02/16/2018 1116   CO2 27 02/09/2017 1213   BUN 12 02/16/2018 1116   BUN 12.0 02/09/2017 1213   CREATININE 0.90 02/16/2018 1116   CREATININE 0.9 02/09/2017 1213      Component Value Date/Time   CALCIUM 9.3 02/16/2018 1116   CALCIUM 8.7 02/09/2017 1213   ALKPHOS 54 02/16/2018 1116   ALKPHOS 62 02/09/2017 1213   AST 25 02/16/2018 1116   AST 24 02/09/2017 1213   ALT 17 02/16/2018 1116   ALT 18 02/09/2017 1213   BILITOT 0.5 02/16/2018 1116   BILITOT 0.34 02/09/2017 1213      No results for input(s): INR in the last 168 hours.  Urinalysis    Component Value Date/Time   COLORURINE STRAW (A) 03/05/2016 1350    STUDIES:  Nm Pet Image Restag (ps) Skull Base To Thigh  Result Date: 02/11/2018 CLINICAL DATA:  Subsequent treatment strategy for metastatic right  breast cancer with bone metastases. EXAM: NUCLEAR MEDICINE PET SKULL BASE TO THIGH TECHNIQUE: 8.5 mCi F-18 FDG was injected intravenously. Full-ring PET imaging was performed from the skull base to thigh after the radiotracer. CT data was obtained and used for attenuation correction and anatomic localization. Fasting blood glucose: 110 mg/dl COMPARISON:  08/11/2017 PET-CT. FINDINGS: Mediastinal blood pool activity: SUV max 3.0 NECK: No hypermetabolic lymph nodes in the neck. Stable uniform hypermetabolism within the normal size thyroid gland without discrete thyroid nodules. Incidental CT findings: none CHEST: No hypermetabolic axillary mediastinal or hilar lymph nodes. No hypermetabolic pulmonary findings. Incidental CT findings: Coronary atherosclerosis. Mildly atherosclerotic nonaneurysmal thoracic aorta. Right mastectomy. No chest wall hypermetabolism. No acute consolidative airspace disease, lung masses or significant pulmonary nodules. ABDOMEN/PELVIS: No abnormal hypermetabolic activity within the liver, pancreas, adrenal glands, or spleen. No hypermetabolic lymph nodes in the abdomen or pelvis. Tiny focus of hypermetabolism along the midline skin surface at the vaginal introitus is favored to represent urinary contamination. Incidental CT findings: Several subcentimeter hypodense lesions scattered in the liver are too small to characterize and are unchanged, considered benign. Marked diffuse colonic diverticulosis. Atherosclerotic nonaneurysmal abdominal aorta. SKELETON: Widespread patchy confluent sclerotic osseous lesions throughout the axial and proximal appendicular skeleton are unchanged on the CT images. No foci of skeletal hypermetabolism. Incidental CT findings: Left total hip arthroplasty. IMPRESSION: 1. No metabolically active metastatic disease. 2. Stable CT appearance of widespread sclerotic osseous metastases, without osseous hypermetabolism. 3.  Aortic Atherosclerosis (ICD10-I70.0).  Electronically Signed   By: Ilona Sorrel M.D.   On: 02/11/2018 10:16     ASSESSMENT: 72 y.o. Jade Mooney woman with stage IV right breast cancer as of NOV 2007 (bone metastases, subcutaneous nodules)  (1) s/p Right mastectomy 1997 for an estrogen receptor and HER-2 positive breast cancer, treated  with  (a) adjuvant chemotherapy according to CALGB 9640 (taxotere x 4 cycles)  (b) high-dose chemotherapy (cyclophosphamide, carboplatin, BCNU) followed by stem cell rescue at [place] 12/22/1995  (c) biopsy-proven metastatic disease November 2007, estrogen receptor and HER-2 positive  (d) trastuzumab (dose? Duration?)  (e) tamoxifen for 5 years completed 2002  (f) participation in a vaccine study at [facility] 2003 (CEA primed dendritic cells, HER-2 primed dendritic cells)  (2) status post right iliac crest biopsy 10/10/2013 for invasive ductal carcinoma, grade 2, estrogen and progesterone receptor positive  (3) zolendronate started 10/12/2013,  repeated every 12 weeks  (a) dexa scan 07/28/2013 normal  (b) zolendronate discontinued after January 2019 dose  (4) biopsy of a scalp lesion 10/17/2013 showed metastatic breast cancer, HER-2/neu negative  (a) biopsy of a right anterior chest wall nodule August 2017 shows adenocarcinoma  (5) anastrozole started 10/28/2013, changed to letrozole 06/27/2016  (a) measurable disease = subcutaneous nodules in scalp and LLQ abdomen  (b)  CA-27-29 is informative  (c)  Repeat PET scan 12/28/2014 shows continuing response ; exam shows resolution of scalp lesions  (d) new skin lesion removed from right anterior chest wall August 2017  (e) status post radiation to the anterior lower right chest wall completed 02/25/2017 (20 sessions)   (f) PET scan 04/04/2016 shows 2 new foci of metabolic activity (T8 and manubrium)  (g) palbociclib added 04/21/2016 at 125 mg 21/7  (h) palbociclib dose dropped to 100 mg 21/7 starting with the March 2018 cycle  (i) bone scan 09/26/2016 is  stable  (j) PET scan 02/03/2017 shows her bone metastases, and in addition a 0.5 cm right middle lobe nodule which may be new  (k) MRI of the cervical spine shows collapse of the left lateral mass of C3.  There is no soft tissue mass or focal neural impingement.  (l) palbociclib reduced to 75 mg as of September 2018  (m) palbociclib held during cervical spine irradiation finished in mid February, 2019  (n) letrozole discontinued February 2019 secondary to likely progression    (6) neoplasia associated pain  (a) painful blastic lesion left femoral intertrochanteric region: Status post radiation completed November 2015  (b) pain left knee, status post TKR remotely  (c) left total hip replacement 03/12/2016  (7) palliative radiation to the cervical spine completed 05/08/2017 in [place]  (8) fulvestrant started 05/04/2017  (a) Palbociclib resumed 06/29/2017  (9) denosumab/Xgeva started 06/01/2017, repeated every 28 days  (10) follow-up/staging studies  (a) PET scan 08/11/2017 shows no evidence for metabolically active tumor.  Sclerotic bone mets do not show hypermetabolism  PLAN:  Jade Mooney now 12 years out from definitive diagnosis of metastatic breast cancer with no evidence of visceral disease.  This is very favorable.  Her bone disease is stable.  The plan is to continue the denosumab/Xgeva monthly, with fulvestrant monthly as well.  We are also continuing the palbociclib.  She is receiving a 23-monthsupply today.  She is very appreciative of the support and [facility] care of Dr. MAnabel Bene  She tells me her primary care physician Dr. SNicki Reaperwill be retiring in December and Dr. SVenetia Maxonwill be taking over.  She is now getting physical therapy for her back problems.  The only limitation I have is that as far as her spine is concerned she should not lift more than 10 pounds.  If her spine is supported there are no  restrictions.  I am making no  changes in Misaki's treatment.  She will return to see me in 3 months.  She knows to call for any other problems that may develop before the next visit.   Tobie Perdue, Virgie Dad, MD  02/16/18 12:28 PM Medical Oncology and Hematology [facility] [address] [place], [place] [address] Tel. [phone]    Fax. [phone]  I, Jacqualyn Posey am acting as a Education administrator for Chauncey Cruel, MD.   I, Lurline Del MD, have reviewed the above documentation for accuracy and completeness, and I agree with the above.

## 2018-02-12 DIAGNOSIS — C50919 Malignant neoplasm of unspecified site of unspecified female breast: Secondary | ICD-10-CM | POA: Diagnosis not present

## 2018-02-15 DIAGNOSIS — Z96652 Presence of left artificial knee joint: Secondary | ICD-10-CM | POA: Diagnosis not present

## 2018-02-15 DIAGNOSIS — M25562 Pain in left knee: Secondary | ICD-10-CM | POA: Diagnosis not present

## 2018-02-16 ENCOUNTER — Inpatient Hospital Stay: Payer: Medicare Other

## 2018-02-16 ENCOUNTER — Inpatient Hospital Stay (HOSPITAL_BASED_OUTPATIENT_CLINIC_OR_DEPARTMENT_OTHER): Payer: Medicare Other | Admitting: Oncology

## 2018-02-16 ENCOUNTER — Telehealth: Payer: Self-pay | Admitting: Pharmacist

## 2018-02-16 ENCOUNTER — Telehealth: Payer: Self-pay | Admitting: Oncology

## 2018-02-16 ENCOUNTER — Inpatient Hospital Stay: Payer: Medicare Other | Attending: Oncology

## 2018-02-16 VITALS — BP 160/70 | HR 59 | Temp 97.8°F | Resp 18 | Ht 63.5 in | Wt 167.8 lb

## 2018-02-16 DIAGNOSIS — C50919 Malignant neoplasm of unspecified site of unspecified female breast: Secondary | ICD-10-CM

## 2018-02-16 DIAGNOSIS — Z5111 Encounter for antineoplastic chemotherapy: Secondary | ICD-10-CM | POA: Diagnosis not present

## 2018-02-16 DIAGNOSIS — C50811 Malignant neoplasm of overlapping sites of right female breast: Secondary | ICD-10-CM

## 2018-02-16 DIAGNOSIS — Z96642 Presence of left artificial hip joint: Secondary | ICD-10-CM | POA: Diagnosis not present

## 2018-02-16 DIAGNOSIS — C50912 Malignant neoplasm of unspecified site of left female breast: Secondary | ICD-10-CM

## 2018-02-16 DIAGNOSIS — C7951 Secondary malignant neoplasm of bone: Secondary | ICD-10-CM

## 2018-02-16 DIAGNOSIS — C50911 Malignant neoplasm of unspecified site of right female breast: Secondary | ICD-10-CM

## 2018-02-16 DIAGNOSIS — Z170 Estrogen receptor positive status [ER+]: Secondary | ICD-10-CM | POA: Diagnosis not present

## 2018-02-16 LAB — CBC WITH DIFFERENTIAL/PLATELET
Abs Immature Granulocytes: 0.01 10*3/uL (ref 0.00–0.07)
Basophils Absolute: 0.1 10*3/uL (ref 0.0–0.1)
Basophils Relative: 3 %
Eosinophils Absolute: 0.1 10*3/uL (ref 0.0–0.5)
Eosinophils Relative: 2 %
HCT: 38.1 % (ref 36.0–46.0)
Hemoglobin: 12.6 g/dL (ref 12.0–15.0)
Immature Granulocytes: 0 %
Lymphocytes Relative: 25 %
Lymphs Abs: 0.8 10*3/uL (ref 0.7–4.0)
MCH: 30.6 pg (ref 26.0–34.0)
MCHC: 33.1 g/dL (ref 30.0–36.0)
MCV: 92.5 fL (ref 80.0–100.0)
Monocytes Absolute: 0.5 10*3/uL (ref 0.1–1.0)
Monocytes Relative: 15 %
Neutro Abs: 1.7 10*3/uL (ref 1.7–7.7)
Neutrophils Relative %: 55 %
Platelets: 196 10*3/uL (ref 150–400)
RBC: 4.12 MIL/uL (ref 3.87–5.11)
RDW: 14.5 % (ref 11.5–15.5)
WBC: 3.1 10*3/uL — ABNORMAL LOW (ref 4.0–10.5)
nRBC: 0 % (ref 0.0–0.2)

## 2018-02-16 LAB — COMPREHENSIVE METABOLIC PANEL
ALT: 17 U/L (ref 0–44)
AST: 25 U/L (ref 15–41)
Albumin: 4.3 g/dL (ref 3.5–5.0)
Alkaline Phosphatase: 54 U/L (ref 38–126)
Anion gap: 10 (ref 5–15)
BUN: 12 mg/dL (ref 8–23)
CO2: 24 mmol/L (ref 22–32)
Calcium: 9.3 mg/dL (ref 8.9–10.3)
Chloride: 105 mmol/L (ref 98–111)
Creatinine, Ser: 0.9 mg/dL (ref 0.44–1.00)
GFR calc Af Amer: >60 mL/min (ref >60)
GFR calc non Af Amer: >60 mL/min (ref >60)
Glucose, Bld: 95 mg/dL (ref 70–99)
Potassium: 4.2 mmol/L (ref 3.5–5.1)
Sodium: 139 mmol/L (ref 135–145)
Total Bilirubin: 0.5 mg/dL (ref 0.3–1.2)
Total Protein: 6.6 g/dL (ref 6.5–8.1)

## 2018-02-16 MED ORDER — DENOSUMAB 120 MG/1.7ML ~~LOC~~ SOLN
120.0000 mg | Freq: Once | SUBCUTANEOUS | Status: AC
  Administered 2018-02-16: 120 mg via SUBCUTANEOUS

## 2018-02-16 MED ORDER — FULVESTRANT 250 MG/5ML IM SOLN
500.0000 mg | Freq: Once | INTRAMUSCULAR | Status: AC
  Administered 2018-02-16: 500 mg via INTRAMUSCULAR

## 2018-02-16 NOTE — Telephone Encounter (Signed)
Oral Chemotherapy Pharmacist Encounter   Spoke withpatient in [facility] lobby today to follow up regarding patient's oral chemotherapy medication: Ibrance for the treatment of metastatic, hormone receptor positive, Her-2 negative breast cancer in combination withFaslodexuntil disease progression or unacceptable toxicity  Original Start date of oral chemotherapy:04/21/2016 Ibrance re-start: 06/29/2017 on 1 capsule (75 mg) by mouth once daily with whole food, take for 21 days on, 7 days off, repeat every 28 days  Dose changed to1 capsule (75 mg) by mouth onceevery other daywith whole food, take for 21 days on, 7 days off, repeat every 28 days on 08/03/2017  Pt reports0tablets/doses of Ibrance '75mg'$  caspules,1 capsule (75 mg) by mouth onceevery other daywith whole food, take for 21 days on, 7 days off, repeat every 28 days,missed in the last month.   Pt reports the following side effects:mild, manageable fatigue  Pertinent labs reviewed:OK for continued treatment. 02/11/2018 PET scan shows stable disease  Dispensed samples to patient:  Medication:Ibrance '75mg'$  capsules Instructions:Take1 capsule (75 mg) by mouth onceevery other daywith whole food, take for 21 days on, 7 days off, repeat every 28 days Quantity dispensed:42 Days supply: 106 Manufacturer:Pfizer IAX:[id number] Exp:08/2018   Patient knows to call the office with questions or concerns. Oral Oncology Clinic will continue to follow.  Johny Drilling, PharmD, BCPS, BCOP  02/16/2018  1:30 PM  Oral Oncology Clinic [phone]

## 2018-02-16 NOTE — Telephone Encounter (Signed)
Gave avs and calendar ° °

## 2018-02-17 DIAGNOSIS — M25562 Pain in left knee: Secondary | ICD-10-CM | POA: Diagnosis not present

## 2018-02-17 DIAGNOSIS — Z96652 Presence of left artificial knee joint: Secondary | ICD-10-CM | POA: Diagnosis not present

## 2018-02-17 LAB — CANCER ANTIGEN 27.29: CA 27.29: 40.6 U/mL — ABNORMAL HIGH (ref 0.0–38.6)

## 2018-02-22 DIAGNOSIS — Z96652 Presence of left artificial knee joint: Secondary | ICD-10-CM | POA: Diagnosis not present

## 2018-02-22 DIAGNOSIS — M25562 Pain in left knee: Secondary | ICD-10-CM | POA: Diagnosis not present

## 2018-02-23 ENCOUNTER — Other Ambulatory Visit: Payer: Self-pay | Admitting: Oncology

## 2018-02-23 DIAGNOSIS — Z1231 Encounter for screening mammogram for malignant neoplasm of breast: Secondary | ICD-10-CM

## 2018-02-26 DIAGNOSIS — M25562 Pain in left knee: Secondary | ICD-10-CM | POA: Diagnosis not present

## 2018-02-26 DIAGNOSIS — Z96652 Presence of left artificial knee joint: Secondary | ICD-10-CM | POA: Diagnosis not present

## 2018-03-02 DIAGNOSIS — Z96652 Presence of left artificial knee joint: Secondary | ICD-10-CM | POA: Diagnosis not present

## 2018-03-02 DIAGNOSIS — M25562 Pain in left knee: Secondary | ICD-10-CM | POA: Diagnosis not present

## 2018-03-04 DIAGNOSIS — D510 Vitamin B12 deficiency anemia due to intrinsic factor deficiency: Secondary | ICD-10-CM | POA: Diagnosis not present

## 2018-03-05 DIAGNOSIS — M25562 Pain in left knee: Secondary | ICD-10-CM | POA: Diagnosis not present

## 2018-03-05 DIAGNOSIS — Z96652 Presence of left artificial knee joint: Secondary | ICD-10-CM | POA: Diagnosis not present

## 2018-03-09 DIAGNOSIS — M25562 Pain in left knee: Secondary | ICD-10-CM | POA: Diagnosis not present

## 2018-03-09 DIAGNOSIS — Z96652 Presence of left artificial knee joint: Secondary | ICD-10-CM | POA: Diagnosis not present

## 2018-03-12 DIAGNOSIS — M25562 Pain in left knee: Secondary | ICD-10-CM | POA: Diagnosis not present

## 2018-03-12 DIAGNOSIS — Z96652 Presence of left artificial knee joint: Secondary | ICD-10-CM | POA: Diagnosis not present

## 2018-03-18 DIAGNOSIS — C50919 Malignant neoplasm of unspecified site of unspecified female breast: Secondary | ICD-10-CM | POA: Diagnosis not present

## 2018-03-18 DIAGNOSIS — C7951 Secondary malignant neoplasm of bone: Secondary | ICD-10-CM | POA: Diagnosis not present

## 2018-03-18 DIAGNOSIS — Z5111 Encounter for antineoplastic chemotherapy: Secondary | ICD-10-CM | POA: Diagnosis not present

## 2018-03-24 HISTORY — PX: CERVICAL FUSION: SHX112

## 2018-04-01 ENCOUNTER — Ambulatory Visit
Admission: RE | Admit: 2018-04-01 | Discharge: 2018-04-01 | Disposition: A | Discharge status: Home / Self Care | Payer: Medicare Other | Source: Ambulatory Visit | Attending: Oncology | Admitting: Oncology

## 2018-04-01 DIAGNOSIS — Z1231 Encounter for screening mammogram for malignant neoplasm of breast: Secondary | ICD-10-CM

## 2018-04-01 DIAGNOSIS — C7951 Secondary malignant neoplasm of bone: Secondary | ICD-10-CM | POA: Diagnosis not present

## 2018-04-01 DIAGNOSIS — C50919 Malignant neoplasm of unspecified site of unspecified female breast: Secondary | ICD-10-CM | POA: Diagnosis not present

## 2018-04-02 DIAGNOSIS — D510 Vitamin B12 deficiency anemia due to intrinsic factor deficiency: Secondary | ICD-10-CM | POA: Diagnosis not present

## 2018-04-12 DIAGNOSIS — I10 Essential (primary) hypertension: Secondary | ICD-10-CM | POA: Diagnosis not present

## 2018-04-12 DIAGNOSIS — R05 Cough: Secondary | ICD-10-CM | POA: Diagnosis not present

## 2018-04-15 DIAGNOSIS — C7951 Secondary malignant neoplasm of bone: Secondary | ICD-10-CM | POA: Diagnosis not present

## 2018-04-15 DIAGNOSIS — C50919 Malignant neoplasm of unspecified site of unspecified female breast: Secondary | ICD-10-CM | POA: Diagnosis not present

## 2018-04-15 DIAGNOSIS — Z5111 Encounter for antineoplastic chemotherapy: Secondary | ICD-10-CM | POA: Diagnosis not present

## 2018-05-04 DIAGNOSIS — D510 Vitamin B12 deficiency anemia due to intrinsic factor deficiency: Secondary | ICD-10-CM | POA: Diagnosis not present

## 2018-05-05 DIAGNOSIS — E782 Mixed hyperlipidemia: Secondary | ICD-10-CM | POA: Diagnosis not present

## 2018-05-05 DIAGNOSIS — I679 Cerebrovascular disease, unspecified: Secondary | ICD-10-CM | POA: Diagnosis not present

## 2018-05-05 DIAGNOSIS — I10 Essential (primary) hypertension: Secondary | ICD-10-CM | POA: Diagnosis not present

## 2018-05-05 DIAGNOSIS — C50911 Malignant neoplasm of unspecified site of right female breast: Secondary | ICD-10-CM | POA: Diagnosis not present

## 2018-05-09 NOTE — Progress Notes (Signed)
[facility]  Telephone:([phone]) [phone] [phone]) [phone]   ID: Jade Mooney DOB: 22-Mar-1946  MR#: [id number]  ZHY#:[id number]  Patient Care Team: Street, Sharon Mt, MD as PCP - General (Family Medicine) Clarene Essex, MD as Consulting Physician (Gastroenterology) Brittyn Salaz, Virgie Dad, MD as Consulting Physician (Oncology) Gaynelle Arabian, MD as Consulting Physician (Orthopedic Surgery) Gatha Mayer, MD as Consulting Physician (Radiation Oncology) Kristeen Miss, MD as Consulting Physician (Neurosurgery) Derwood Kaplan, MD as Consulting Physician (Oncology)   CHIEF COMPLAINT: Newly diagnosed metastatic breast cancer  CURRENT TREATMENT: fulvestrant, denosumab/Xgeva, palbociclib   INTERVAL HISTORY: Jade Mooney returns today for follow-up and treatment of her estrogen receptor positive breast cancer. She is accompanied by her husband.  She continues on palbociclib/Ibrance at 75 mg every other day, 21 days on 7 days off.   She also continues on fulvestrant received every 4 weeks.  She tolerates that with no side effects that she is aware of.  Finally, she also continues on denosumab/Xgeva, received every 4 weeks.  She also tolerates this well and specifically has had no dental issues.  Since her last visit here, she underwent a digital screening unilateral left mammogram with tomography on 04/01/2018 showing Breast Density Category C. There is no mammographic evidence of malignancy.    Her last restaging PET was on 02/11/2018 showing no metabolically active metastatic disease. Stable CT appearance of widespread sclerotic osseous metastases, without osseous hypermetabolism.  Results for DAMIRA, KEM (MRN [id number]) as of 05/11/2018 10:28  Ref. Range 05/04/2017 10:12 06/29/2017 10:55 08/24/2017 09:57 11/24/2017 11:01 02/16/2018 11:16  CA 27.29 Latest Ref Range: 0.0 - 38.6 U/mL 85.6 (H) 72.1 (H) 56.4 (H) 58.8 (H) 40.6 (H)     REVIEW OF SYSTEMS: Novice says  she has been feeling well. She recently went on a week long cruise with her husband. Her husband developed the flu while on the cruise, and they were both given Tamiflu by the doctor on board. Seleny developed a light cough, but seems to have avoided the flu. She has some pain from her arthritis, mostly in her shoulder. She has had some issues with allergies. She has a new PCP and she is happy with him so far. She went to physical therapy for her knee, but she hasn't been active since returning from her cruise. She is still active at her church. The patient denies unusual headaches, visual changes, nausea, vomiting, or dizziness. There has been no unusual cough, phlegm production, or pleurisy. This been no change in bowel or bladder habits. The patient denies unexplained fatigue or unexplained weight loss, bleeding, rash, or fever. A detailed review of systems was otherwise noncontributory.    BREAST CANCER HISTORY: From the earlier summary notes:  Jade Mooney was referred to Dr. Clarene Essex for evaluation of abdominal discomfort and nausea. Exam and blood work including liver function tests was unremarkable aside from normochromic normocytic anemia. Cholecystitis was suspected and the patient underwent endoscopy followed by abdominal/ pelvic CT scan on 09/29/2013. This showed showed a normal gallbladder. Incidental findings included multiple simple cysts in the liver and aortic atherosclerosis. However mixed lytic and sclerotic bony lesions were noted. On 10/10/2013 the patient underwent biopsy of the right iliac bone, and this showed ([id number]) metastatic invasive ductal carcinoma, grade 2, estrogen and progesterone receptor strongly positive.  [name] has a remote history of breast cancer, which we tried to reconstruct. She was originally diagnosed early in 1997 with stage III disease, and underwent right mastectomy followed by adjuvant chemotherapy. She then  participated in a [facility] protocol with high-dose  chemotherapy followed by stem cell rescue 12/22/1995. Unfortunately later that year liver lesions were noted and were biopsy-proven to be metastatic breast cancer. This was not felt to be resectable. However the tumor was estrogen receptor positive and HER-2 positive. The patient was treated with Herceptin (she does not know for what period of time) and was then on tamoxifen until November of 2002. She also participated in a vaccine study at [facility].  [name]'s subsequent history is as detailed below   PAST MEDICAL HISTORY: Past Medical History:  Diagnosis Date  . Anxiety   . Cancer [facility])    Breast 1997 right tx with mastectomy and chemo, metastatic now  . GERD (gastroesophageal reflux disease)   . Hypercholesterolemia   . Hypertension   . Hypothyroidism   . Osteoarthritis    oa  . Personal history of chemotherapy   . Personal history of radiation therapy   . TIA (transient ischemic attack) last 11-08-15   x 2 total    PAST SURGICAL HISTORY: Past Surgical History:  Procedure Laterality Date  . CATARACT EXTRACTION, BILATERAL    . MASTECTOMY MODIFIED RADICAL Right 1997  . porta cath insertion  1997   later removed  . radiation tx  finished 02-25-16   x 20 tx  . TONSILLECTOMY    . TOTAL HIP ARTHROPLASTY Left 03/12/2016   Procedure: LEFT TOTAL HIP ARTHROPLASTY ANTERIOR APPROACH;  Surgeon: Gaynelle Arabian, MD;  Location: [facility];  Service: Orthopedics;  Laterality: Left;  . TOTAL KNEE ARTHROPLASTY Left   . TUBAL LIGATION      FAMILY HISTORY: Family History  Problem Relation Age of Onset  . Breast cancer Maternal Aunt    The patient's father died at the age of 61 from heart disease. The patient's mother died at the age of 71. The patient had no brothers, one sister. One of the patient's mother's 5 sisters was diagnosed with breast cancer in her 73s   GYNECOLOGIC HISTORY:  No LMP recorded. Patient is postmenopausal. Menarche age 65, the patient is GX P0. She stopped having periods  approximately 1994. She used hormone replacement until 1997. 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 Her husband [name] (goes by "[name]" was [employer]. of information all services at [employer]. They live alone, with no pets.   ADVANCED DIRECTIVES: In place   HEALTH MAINTENANCE: Social History   Tobacco Use  . Smoking status: Never Smoker  . Smokeless tobacco: Never Used  Substance Use Topics  . Alcohol use: Yes    Comment: 1 glass wine 4-5 x week  . Drug use: No    Colonoscopy: 2013  PAP: 2010  Bone density: 2012  Lipid panel:  Allergies  Allergen Reactions  . Amoxicillin Hives    Has patient had a PCN reaction causing immediate rash, facial/tongue/throat swelling, SOB or lightheadedness with hypotension:unsure Has patient had a PCN reaction causing severe rash involving mucus membranes or skin necrosis:No Has patient had a PCN reaction that required hospitalization:No Has patient had a PCN reaction occurring within the last 10 years: Yes If all of the above answers are "NO", then may proceed with Cephalosporin use.   Marland Kitchen Percocet [Oxycodone-Acetaminophen] Nausea And Vomiting  . Percodan [Oxycodone-Aspirin] Nausea And Vomiting  . Tramadol Itching    Current Outpatient Medications  Medication Sig Dispense Refill  . acetaminophen (TYLENOL) 500  MG tablet Take 1,000 mg by mouth every 6 (six) hours as needed for mild pain. Reported on 04/17/2015    . calcium carbonate (OSCAL) 1500 (600 Ca) MG TABS tablet Take 600 mg of elemental calcium by mouth 2 (two) times daily with a meal.    . cholecalciferol (VITAMIN D) 1000 units tablet Take 1,000 Units by mouth daily.    . clopidogrel (PLAVIX) 75 MG tablet Take 1 tablet (75 mg total) by mouth daily.    . cyanocobalamin (,VITAMIN B-12,) 1000 MCG/ML injection Inject 1,000 mcg into the muscle once.    Marland Kitchen levothyroxine (SYNTHROID,  LEVOTHROID) 88 MCG tablet Take 88 mcg by mouth daily before breakfast.    . Lutein 6 MG TABS Take 6 mg by mouth 2 (two) times daily.     . magnesium gluconate (MAGONATE) 500 MG tablet Take 500 mg by mouth at bedtime.    . meclizine (ANTIVERT) 25 MG tablet Take 25 mg by mouth 3 (three) times daily as needed for dizziness.    Marland Kitchen METRONIDAZOLE, TOPICAL, 0.75 % LOTN Apply 1 application topically at bedtime. Apply to face for rosacea    . palbociclib (IBRANCE) 75 MG capsule Take 1 capsule (75 mg total) by mouth daily with breakfast. Take whole with food on days 1-21, every 28 days. (Patient taking differently: Take 75 mg by mouth as directed. Take every other day with food on days 1-21, every 28 days.) 21 capsule 2  . pantoprazole (PROTONIX) 40 MG tablet Take 40 mg by mouth 2 (two) times daily. Before breakfast and before supper    . Polyethyl Glycol-Propyl Glycol (SYSTANE ULTRA) 0.4-0.3 % SOLN Place 1-2 drops into both eyes 2 (two) times daily.    . polyethylene glycol (MIRALAX / GLYCOLAX) packet Take 4 g by mouth daily. 1 teaspoonful in the morning    . rOPINIRole (REQUIP) 0.5 MG tablet Take 0.5 mg by mouth 3 (three) times daily.    . simvastatin (ZOCOR) 40 MG tablet Take 40 mg by mouth at bedtime.     . traZODone (DESYREL) 50 MG tablet Take 25 mg by mouth at bedtime.      No current facility-administered medications for this visit.     OBJECTIVE: Older white woman in no acute distress  Vitals:   05/11/18 1023  BP: 137/75  Pulse: (!) 59  Resp: 18  Temp: 98.5 F (36.9 C)  SpO2: 100%     Body mass index is 29.75 kg/m.    ECOG FS:1 - Symptomatic but completely ambulatory  Sclerae unicteric, EOMs intact Oropharynx clear and moist No cervical or supraclavicular adenopathy Lungs no rales or rhonchi Heart regular rate and rhythm Abd soft, nontender, positive bowel sounds MSK no focal spinal tenderness, no upper extremity lymphedema Neuro: nonfocal, well oriented, appropriate affect Breasts:  She is status post mastectomy and radiation on the right side.  There is a patch of telangiectasias which has not changed from baseline.  The left breast is benign.  Both axillae are benign.  LAB RESULTS: CMP     Component Value Date/Time   NA 137 05/11/2018 0946   NA 138 02/09/2017 1213   K 4.1 05/11/2018 0946   K 3.9 02/09/2017 1213   CL 101 05/11/2018 0946   CO2 25 05/11/2018 0946   CO2 27 02/09/2017 1213   GLUCOSE 84 05/11/2018 0946   GLUCOSE 100 02/09/2017 1213   BUN 17 05/11/2018 0946   BUN 12.0 02/09/2017 1213   CREATININE 0.83 05/11/2018 0946  CREATININE 0.9 02/09/2017 1213   CALCIUM 9.1 05/11/2018 0946   CALCIUM 8.7 02/09/2017 1213   PROT 6.6 05/11/2018 0946   PROT 6.5 02/09/2017 1213   ALBUMIN 4.3 05/11/2018 0946   ALBUMIN 4.1 02/09/2017 1213   AST 19 05/11/2018 0946   AST 24 02/09/2017 1213   ALT 12 05/11/2018 0946   ALT 18 02/09/2017 1213   ALKPHOS 58 05/11/2018 0946   ALKPHOS 62 02/09/2017 1213   BILITOT 0.4 05/11/2018 0946   BILITOT 0.34 02/09/2017 1213   GFRNONAA >60 05/11/2018 0946   GFRAA >60 05/11/2018 0946   No results found for: SPEP  Lab Results  Component Value Date   WBC 3.1 (L) 05/11/2018   NEUTROABS 1.6 (L) 05/11/2018   HGB 12.6 05/11/2018   HCT 38.5 05/11/2018   MCV 90.4 05/11/2018   PLT 192 05/11/2018      Chemistry      Component Value Date/Time   NA 137 05/11/2018 0946   NA 138 02/09/2017 1213   K 4.1 05/11/2018 0946   K 3.9 02/09/2017 1213   CL 101 05/11/2018 0946   CO2 25 05/11/2018 0946   CO2 27 02/09/2017 1213   BUN 17 05/11/2018 0946   BUN 12.0 02/09/2017 1213   CREATININE 0.83 05/11/2018 0946   CREATININE 0.9 02/09/2017 1213      Component Value Date/Time   CALCIUM 9.1 05/11/2018 0946   CALCIUM 8.7 02/09/2017 1213   ALKPHOS 58 05/11/2018 0946   ALKPHOS 62 02/09/2017 1213   AST 19 05/11/2018 0946   AST 24 02/09/2017 1213   ALT 12 05/11/2018 0946   ALT 18 02/09/2017 1213   BILITOT 0.4 05/11/2018 0946   BILITOT  0.34 02/09/2017 1213      No results for input(s): INR in the last 168 hours.  Urinalysis    Component Value Date/Time   COLORURINE STRAW (A) 03/05/2016 1350    STUDIES:  No results found.   ASSESSMENT: 73 y.o. [place] woman with stage IV right breast cancer as of NOV 2007 (bone metastases, subcutaneous nodules)  (1) s/p Right mastectomy 1997 for an estrogen receptor and HER-2 positive breast cancer, treated with  (a) adjuvant chemotherapy according to CALGB 9640 (taxotere x 4 cycles)  (b) high-dose chemotherapy (cyclophosphamide, carboplatin, BCNU) followed by stem cell rescue at [facility] 12/22/1995  (c) biopsy-proven metastatic disease November 2007, estrogen receptor and HER-2 positive  (d) trastuzumab (dose? Duration?)  (e) tamoxifen for 5 years completed 2002  (f) participation in a vaccine study at [facility] 2003 (CEA primed dendritic cells, HER-2 primed dendritic cells)  (2) status post right iliac crest biopsy 10/10/2013 for invasive ductal carcinoma, grade 2, estrogen and progesterone receptor positive  (3) zolendronate started 10/12/2013,  repeated every 12 weeks  (a) dexa scan 07/28/2013 normal  (b) zolendronate discontinued after January 2019 dose  (4) biopsy of a scalp lesion 10/17/2013 showed metastatic breast cancer, HER-2/neu negative  (a) biopsy of a right anterior chest wall nodule August 2017 shows adenocarcinoma  (5) anastrozole started 10/28/2013, changed to letrozole 06/27/2016  (a) measurable disease = subcutaneous nodules in scalp and LLQ abdomen  (b)  CA-27-29 is informative  (c)  Repeat PET scan 12/28/2014 shows continuing response ; exam shows resolution of scalp lesions  (d) new skin lesion removed from right anterior chest wall August 2017  (e) status post radiation to the anterior lower right chest wall completed 02/25/2017 (20 sessions)   (f) PET scan 04/04/2016 shows 2 new foci of metabolic activity (T8  and manubrium)  (g) palbociclib added 04/21/2016  at 125 mg 21/7  (h) palbociclib dose dropped to 100 mg 21/7 starting with the March 2018 cycle  (i) bone scan 09/26/2016 is stable  (j) PET scan 02/03/2017 shows her bone metastases, and in addition a 0.5 cm right middle lobe nodule which may be new  (k) MRI of the cervical spine shows collapse of the left lateral mass of C3.  There is no soft tissue mass or focal neural impingement.  (l) palbociclib reduced to 75 mg as of September 2018  (m) palbociclib held during cervical spine irradiation finished in mid February, 2019  (n) letrozole discontinued February 2019 secondary to likely progression   (6) neoplasia associated pain  (a) painful blastic lesion left femoral intertrochanteric region: Status post radiation completed November 2015  (b) pain left knee, status post TKR remotely  (c) left total hip replacement 03/12/2016  (7) palliative radiation to the cervical spine completed 05/08/2017 in [place]  (8) fulvestrant started 05/04/2017  (a) Palbociclib resumed 06/29/2017  (9) denosumab/Xgeva started 06/01/2017, repeated every 28 days  (10) follow-up/staging studies  (a) PET scan 08/11/2017 shows no evidence for metabolically active tumor.  Sclerotic bone mets do not show hypermetabolism  (b) PET scan 02/11/2018 shows continuing excellent tumor control   PLAN: Adessa is now a little over 12 years out from definitive diagnosis of metastatic breast cancer.  Her tumor is well controlled and and she is tolerating her treatment remarkably well.  We are at the lowest available dose of palbociclib.  So far her counts are holding but if they do not we may need to switch to a different cyclin inhibitor.   We were able to provide her with the next 3 months of palbociclib through courtesy of the company.  I am delighted that she is just about pain-free.  When she is experiencing in her left shoulder is most likely bursitis.  We are repeating a CA-27-29 today.  She will call for those  results.  She will continue to receive fulvestrant every 4 weeks in [place] at the cancer center, and Xgeva at the same time.  She will return here in late May.  Before that visit she will have a restaging PET scan.  She knows to call for any other issue that may develop before the next visit.  I spent approximately 30 minutes face to face with Berdella with more than 50% of that time spent in counseling and coordination of care.    Vanya Carberry, Virgie Dad, MD  05/11/18 10:54 AM Medical Oncology and Hematology [facility] [address] [place], [place] [address] Tel. [phone]    Fax. [phone]  I, Jacqualyn Posey am acting as a Education administrator for Chauncey Cruel, MD.   I, Lurline Del MD, have reviewed the above documentation for accuracy and completeness, and I agree with the above.

## 2018-05-11 ENCOUNTER — Inpatient Hospital Stay: Payer: Medicare Other

## 2018-05-11 ENCOUNTER — Telehealth: Payer: Self-pay | Admitting: Oncology

## 2018-05-11 ENCOUNTER — Inpatient Hospital Stay (HOSPITAL_BASED_OUTPATIENT_CLINIC_OR_DEPARTMENT_OTHER): Payer: Medicare Other | Admitting: Oncology

## 2018-05-11 ENCOUNTER — Inpatient Hospital Stay: Payer: Medicare Other | Attending: Oncology

## 2018-05-11 ENCOUNTER — Encounter: Payer: Self-pay | Admitting: Pharmacist

## 2018-05-11 VITALS — BP 137/75 | HR 59 | Temp 98.5°F | Resp 18 | Ht 63.5 in | Wt 170.6 lb

## 2018-05-11 DIAGNOSIS — C7951 Secondary malignant neoplasm of bone: Secondary | ICD-10-CM | POA: Diagnosis not present

## 2018-05-11 DIAGNOSIS — Z96642 Presence of left artificial hip joint: Secondary | ICD-10-CM | POA: Diagnosis not present

## 2018-05-11 DIAGNOSIS — Z5111 Encounter for antineoplastic chemotherapy: Secondary | ICD-10-CM | POA: Insufficient documentation

## 2018-05-11 DIAGNOSIS — C50912 Malignant neoplasm of unspecified site of left female breast: Secondary | ICD-10-CM

## 2018-05-11 DIAGNOSIS — C50911 Malignant neoplasm of unspecified site of right female breast: Secondary | ICD-10-CM

## 2018-05-11 DIAGNOSIS — C50811 Malignant neoplasm of overlapping sites of right female breast: Secondary | ICD-10-CM

## 2018-05-11 DIAGNOSIS — Z170 Estrogen receptor positive status [ER+]: Secondary | ICD-10-CM | POA: Diagnosis not present

## 2018-05-11 DIAGNOSIS — C50919 Malignant neoplasm of unspecified site of unspecified female breast: Secondary | ICD-10-CM

## 2018-05-11 DIAGNOSIS — M25512 Pain in left shoulder: Secondary | ICD-10-CM | POA: Diagnosis not present

## 2018-05-11 LAB — CBC WITH DIFFERENTIAL/PLATELET
Abs Immature Granulocytes: 0.02 10*3/uL (ref 0.00–0.07)
Basophils Absolute: 0.1 10*3/uL (ref 0.0–0.1)
Basophils Relative: 2 %
Eosinophils Absolute: 0.1 10*3/uL (ref 0.0–0.5)
Eosinophils Relative: 3 %
HCT: 38.5 % (ref 36.0–46.0)
Hemoglobin: 12.6 g/dL (ref 12.0–15.0)
Immature Granulocytes: 1 %
Lymphocytes Relative: 27 %
Lymphs Abs: 0.8 10*3/uL (ref 0.7–4.0)
MCH: 29.6 pg (ref 26.0–34.0)
MCHC: 32.7 g/dL (ref 30.0–36.0)
MCV: 90.4 fL (ref 80.0–100.0)
Monocytes Absolute: 0.5 10*3/uL (ref 0.1–1.0)
Monocytes Relative: 15 %
Neutro Abs: 1.6 10*3/uL — ABNORMAL LOW (ref 1.7–7.7)
Neutrophils Relative %: 52 %
Platelets: 192 10*3/uL (ref 150–400)
RBC: 4.26 MIL/uL (ref 3.87–5.11)
RDW: 14 % (ref 11.5–15.5)
WBC: 3.1 10*3/uL — ABNORMAL LOW (ref 4.0–10.5)
nRBC: 0 % (ref 0.0–0.2)

## 2018-05-11 LAB — COMPREHENSIVE METABOLIC PANEL
ALT: 12 U/L (ref 0–44)
AST: 19 U/L (ref 15–41)
Albumin: 4.3 g/dL (ref 3.5–5.0)
Alkaline Phosphatase: 58 U/L (ref 38–126)
Anion gap: 11 (ref 5–15)
BUN: 17 mg/dL (ref 8–23)
CO2: 25 mmol/L (ref 22–32)
Calcium: 9.1 mg/dL (ref 8.9–10.3)
Chloride: 101 mmol/L (ref 98–111)
Creatinine, Ser: 0.83 mg/dL (ref 0.44–1.00)
GFR calc Af Amer: >60 mL/min (ref >60)
GFR calc non Af Amer: >60 mL/min (ref >60)
Glucose, Bld: 84 mg/dL (ref 70–99)
Potassium: 4.1 mmol/L (ref 3.5–5.1)
Sodium: 137 mmol/L (ref 135–145)
Total Bilirubin: 0.4 mg/dL (ref 0.3–1.2)
Total Protein: 6.6 g/dL (ref 6.5–8.1)

## 2018-05-11 MED ORDER — DENOSUMAB 120 MG/1.7ML ~~LOC~~ SOLN
120.0000 mg | Freq: Once | SUBCUTANEOUS | Status: AC
  Administered 2018-05-11: 120 mg via SUBCUTANEOUS

## 2018-05-11 MED ORDER — FULVESTRANT 250 MG/5ML IM SOLN
500.0000 mg | Freq: Once | INTRAMUSCULAR | Status: AC
  Administered 2018-05-11: 500 mg via INTRAMUSCULAR

## 2018-05-11 MED ORDER — DENOSUMAB 120 MG/1.7ML ~~LOC~~ SOLN
SUBCUTANEOUS | Status: AC
  Filled 2018-05-11: qty 1.7

## 2018-05-11 MED ORDER — FULVESTRANT 250 MG/5ML IM SOLN
INTRAMUSCULAR | Status: AC
  Filled 2018-05-11: qty 10

## 2018-05-11 NOTE — Progress Notes (Signed)
Oral Chemotherapy Pharmacist Encounter  Follow-Up Form  Spoke with patient in exam room today to follow up regarding patient's oral chemotherapy medication: Ibrance (palbociclib) for the treatment of metastatic, hormone receptor positive, Her-2 negative breast cancer in combination withFaslodexuntil disease progression or unacceptable toxicity.  Original Start date of oral chemotherapy:04/21/2016 Ibrance re-start: 06/29/2017 on 1 capsule (75 mg) by mouth once daily with whole food, take for 21 days on, 7 days off, repeat every 28 days  Dose changed to1 capsule (75 mg) by mouth onceevery other daywith whole food, take for 21 days on, 7 days off, repeat every 28 days on 08/03/2017  Pt reports0tablets/doses of Ibrance '75mg'$  caspules,1 capsule (75 mg) by mouth onceevery other daywith whole food, take for 21 days on, 7 days off, repeat every 28 days,missed in the last month  Pt reports the following side effects: mild, manageable fatigue, unchanged  Pertinent labs reviewed: OK for continued treatment, plan for PET scan prior to OV in May 2020.  Dispensed samples to patient:  Medication:Ibrance '75mg'$  capsules Instructions:Take1 capsule (75 mg) by mouth onceevery other daywith whole food, take for 21 days on, 7 days off, repeat every 28 days Quantity dispensed:42 Days supply:106 Manufacturer:Pfizer DSW:[id number] Exp:08/2018  Patient informed that [facility] will be changing to tablet formulation as of 06/23/2018, and will be dispensed in a box of 3 cards as opposed to current dispensing of a bottle with 21 capsules.  Patient knows to call the office with questions or concerns. Oral Oncology Clinic will continue to follow.  Johny Drilling, PharmD, BCPS, BCOP  05/11/2018 8:18 AM Oral Oncology Clinic [phone]

## 2018-05-11 NOTE — Telephone Encounter (Signed)
Gave avs and calendar ° °

## 2018-05-12 LAB — CANCER ANTIGEN 27.29: CA 27.29: 35.6 U/mL (ref 0.0–38.6)

## 2018-06-02 DIAGNOSIS — D510 Vitamin B12 deficiency anemia due to intrinsic factor deficiency: Secondary | ICD-10-CM | POA: Diagnosis not present

## 2018-06-10 DIAGNOSIS — C50919 Malignant neoplasm of unspecified site of unspecified female breast: Secondary | ICD-10-CM | POA: Diagnosis not present

## 2018-06-10 DIAGNOSIS — C7951 Secondary malignant neoplasm of bone: Secondary | ICD-10-CM | POA: Diagnosis not present

## 2018-06-10 DIAGNOSIS — Z5111 Encounter for antineoplastic chemotherapy: Secondary | ICD-10-CM | POA: Diagnosis not present

## 2018-06-22 IMAGING — PT NM PET TUM IMG RESTAG (PS) SKULL BASE T - THIGH
9 series · 25 of 25 positions shown · non-contrast
Comparison: None.

CLINICAL DATA: Subsequent treatment strategy for RIGHT breast
carcinoma.

EXAM:
NUCLEAR MEDICINE PET SKULL BASE TO THIGH
TECHNIQUE: 7.24 mCi F-18 FDG was injected intravenously. Full-ring PET imaging
was performed from the skull base to thigh after the radiotracer. CT
data was obtained and used for attenuation correction and anatomic
localization.
FASTING BLOOD GLUCOSE:  Value: 90 mg/dl

[Series 3: pet sk_thigh ac · axial · 5.0mm · 4.07mm/px · z∈[-1200,-392]mm · 4 of 203 slices shown]
[im 1/203]
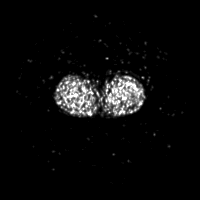
[im 68/203]
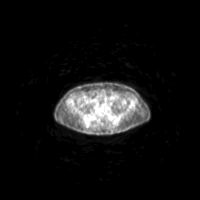
[im 135/203]
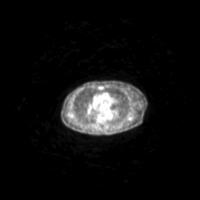
[im 203/203]
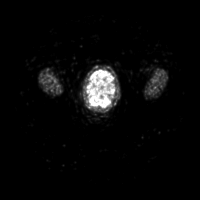

[Series 4: ct sk_thigh 5.0 b31f · axial · 5.0mm · 0.98mm/px · z∈[-1200,-392]mm · 4 of 203 slices shown]
[im 1/203]
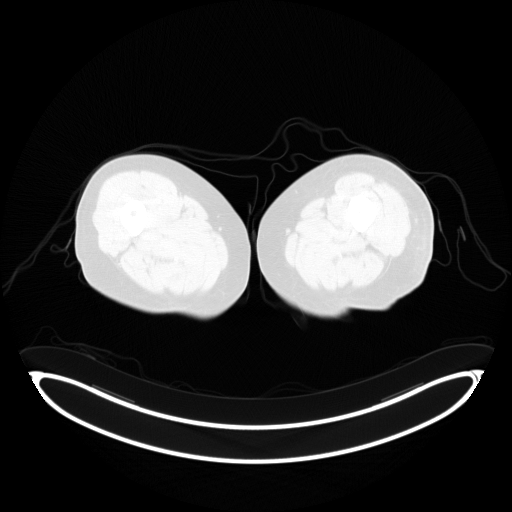
[im 68/203]
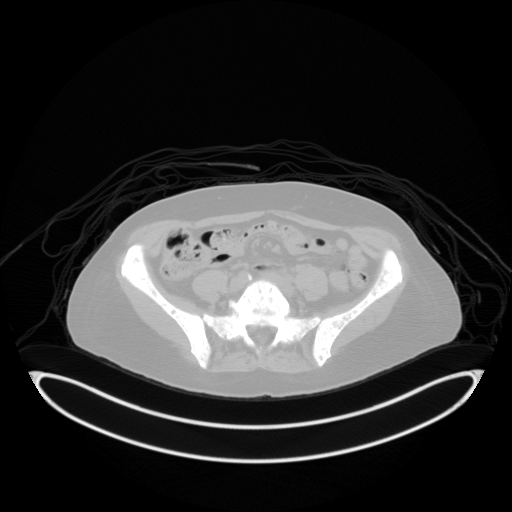
[im 135/203]
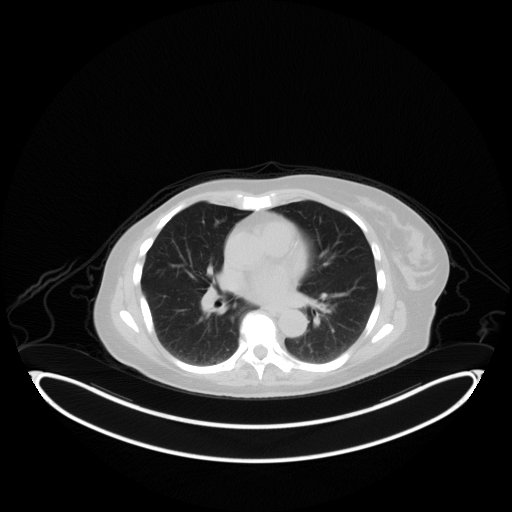
[im 203/203  brain]
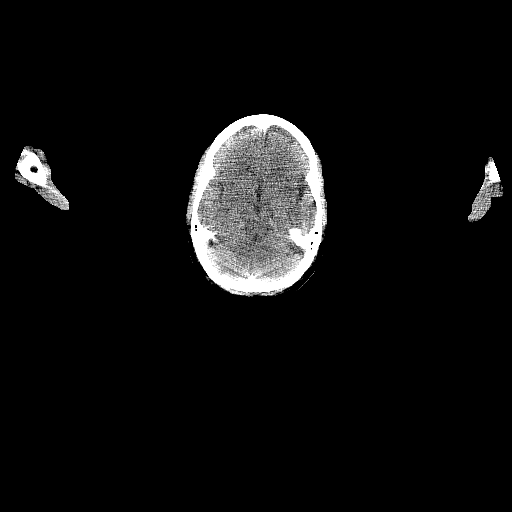

[Series 7: pet sk_thigh nac · axial · 5.0mm · 4.07mm/px · z∈[-1200,-392]mm · 5 of 203 slices shown]
[im 1/203]
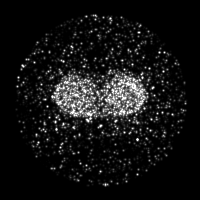
[im 51/203]
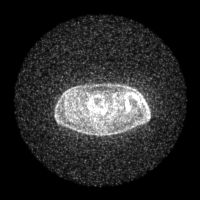
[im 102/203]
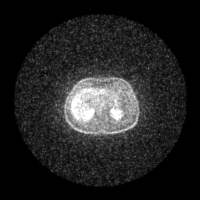
[im 152/203]
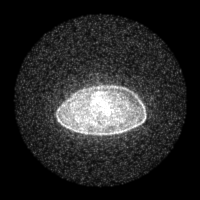
[im 203/203]
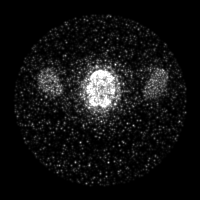

[Series 8: ct sk_thigh 5.0 b70f lung_bone · axial · 5.0mm · 0.54mm/px · z∈[-752,-520]mm · 2 of 59 slices shown]
[im 1/59  bone]
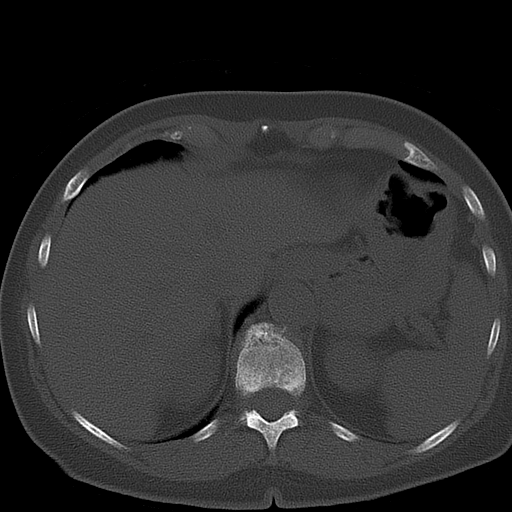
[im 59/59  bone]
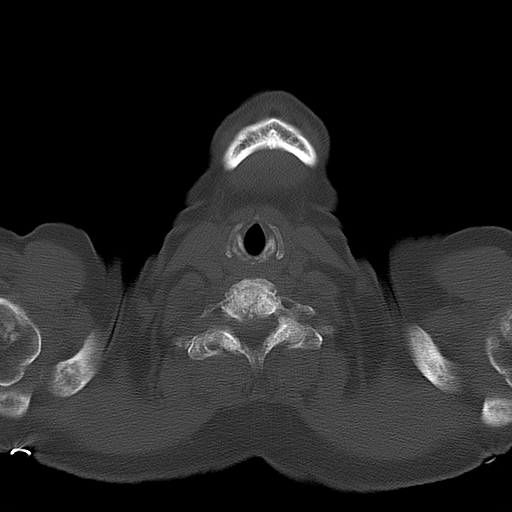

[Series 603: mip collection · coronal · 1.68mm/px · 1 of 32 slices shown]
[im 1/32]
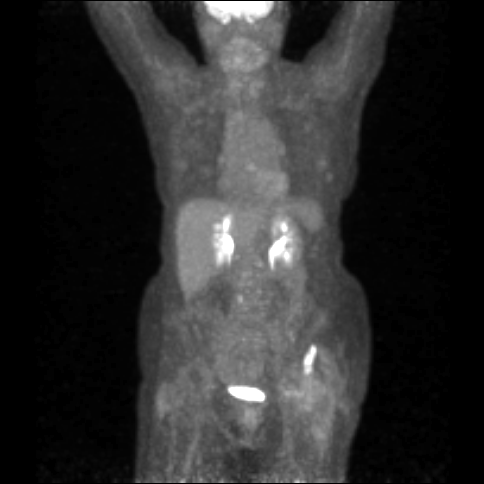

[Series 604: range-ct sk_thigh 5.0 (id)<alpha range> · 2 of 59 slices shown (1 of 2)]
[im 1/59]
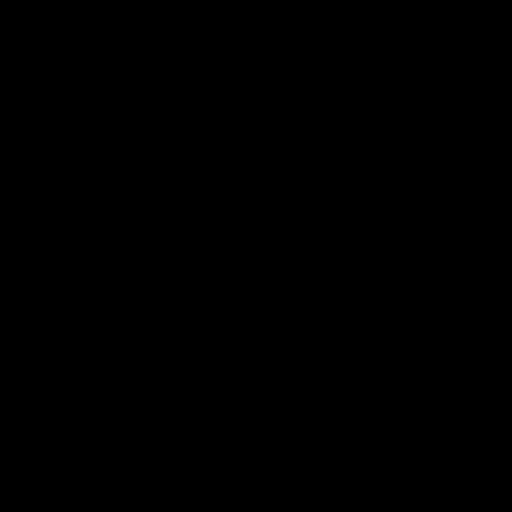
[im 59/59]
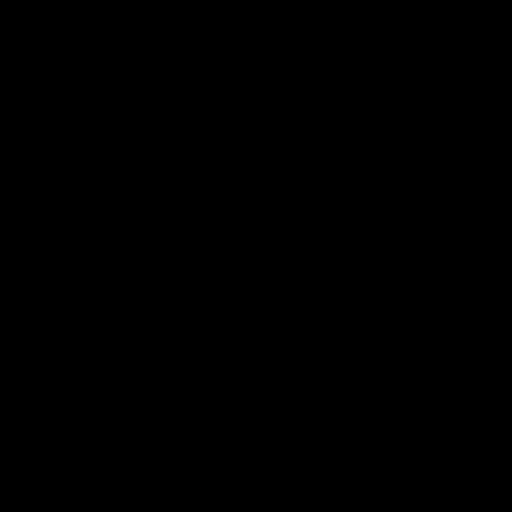

[Series 605: range-ct sk_thigh 5.0 (id)<alpha range> · 5 of 195 slices shown (2 of 2)]
[im 1/195]
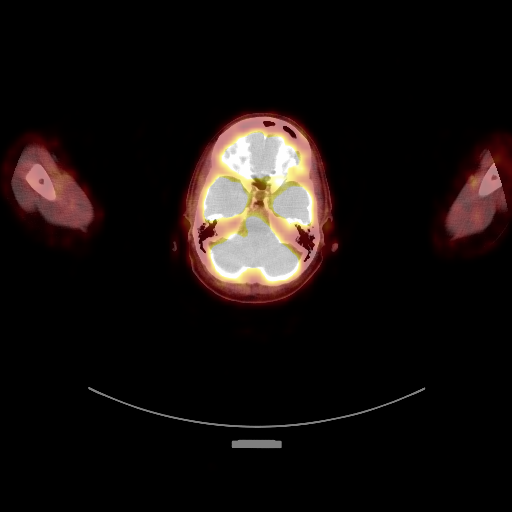
[im 49/195]
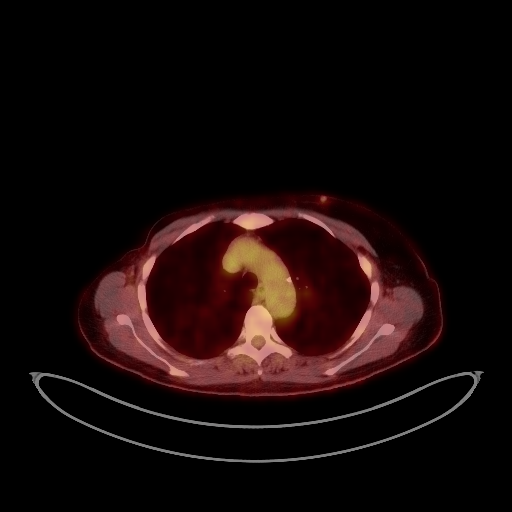
[im 98/195]
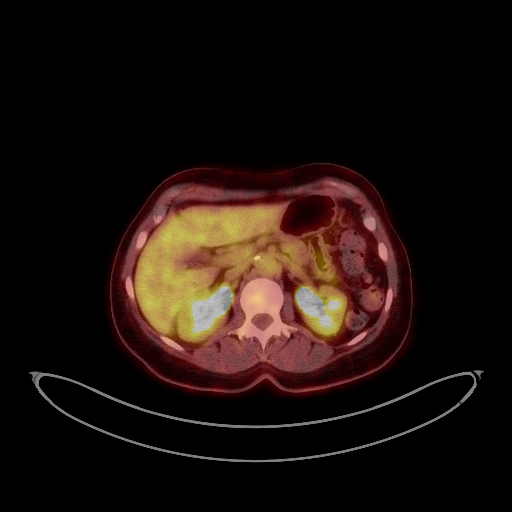
[im 146/195]
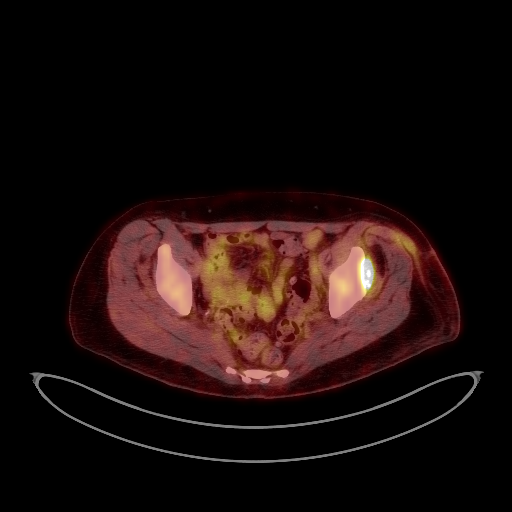
[im 195/195]
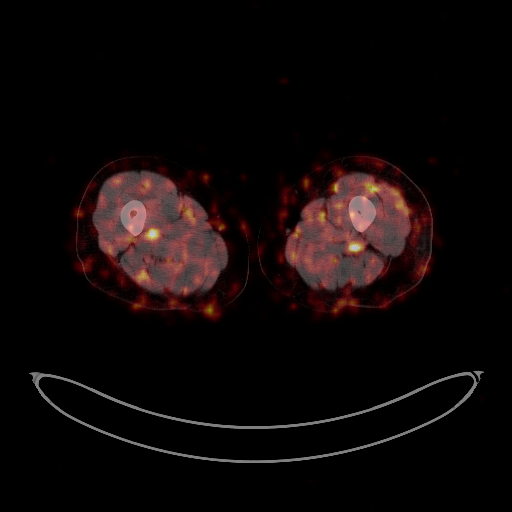

[Series 1318: results mm oncology reading · 0.77mm/px · 1 of 2 slices shown (1 of 2)]
[im 1/2]
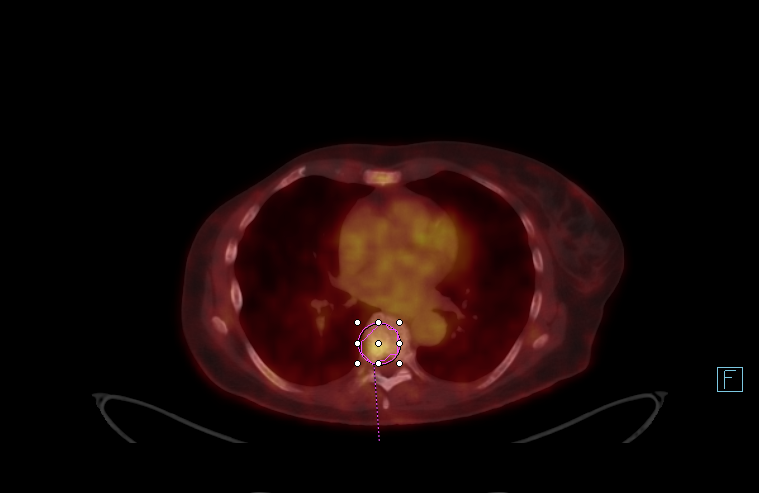

[Series 1457: results mm oncology reading · 1.00mm/px · 1 of 1 slices shown (2 of 2)]
[im 1/1]
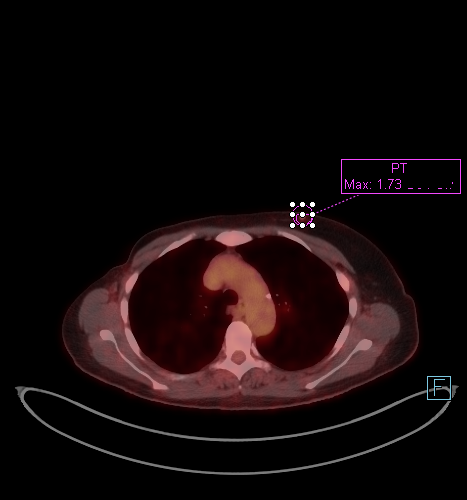

[25 of 25 positions shown; findings below may reference images not displayed]

FINDINGS: NECK

No hypermetabolic lymph nodes in the neck.

CHEST

No hypermetabolic mediastinal or hilar nodes. No suspicious
pulmonary nodules on the CT scan.

Post RIGHT mastectomy anatomy.  No axillary adenopathy

ABDOMEN/PELVIS

No abnormal hypermetabolic activity within the liver, pancreas,
adrenal glands, or spleen. No hypermetabolic lymph nodes in the
abdomen or pelvis.

Uterus and ovaries normal.

SKELETON

Widespread sclerotic skeletal metastasis the vast majority of which
are not metabolic. No change in sclerotic pattern.

Two new foci of metabolic activity on the background of widespread
dense sclerotic metastasis. One new foci of metabolic activity
within T8 vertebral body (image 66 of the fused data set) with SUV
max equal 4.7. Second focus of increased metabolic activities within
the manubrium with SUV max equal 3.8.

Uptake associated with the LEFT hip prosthetic is favored benign
postsurgical change.
IMPRESSION: 1. Two new foci of metabolic bone metastasis (T8 and manubrium) on
the background of diffuse widespread non metabolic skeletal
sclerotic metastasis.
2. No evidence of soft tissue metastasis.
3. RIGHT mastectomy anatomy.

## 2018-07-01 DIAGNOSIS — E785 Hyperlipidemia, unspecified: Secondary | ICD-10-CM | POA: Diagnosis not present

## 2018-07-01 DIAGNOSIS — E538 Deficiency of other specified B group vitamins: Secondary | ICD-10-CM | POA: Diagnosis not present

## 2018-07-01 DIAGNOSIS — E039 Hypothyroidism, unspecified: Secondary | ICD-10-CM | POA: Diagnosis not present

## 2018-07-01 DIAGNOSIS — Z79899 Other long term (current) drug therapy: Secondary | ICD-10-CM | POA: Diagnosis not present

## 2018-07-08 DIAGNOSIS — C7951 Secondary malignant neoplasm of bone: Secondary | ICD-10-CM | POA: Diagnosis not present

## 2018-07-08 DIAGNOSIS — C50919 Malignant neoplasm of unspecified site of unspecified female breast: Secondary | ICD-10-CM | POA: Diagnosis not present

## 2018-07-22 DIAGNOSIS — E782 Mixed hyperlipidemia: Secondary | ICD-10-CM | POA: Diagnosis not present

## 2018-07-22 DIAGNOSIS — K219 Gastro-esophageal reflux disease without esophagitis: Secondary | ICD-10-CM | POA: Diagnosis not present

## 2018-08-02 DIAGNOSIS — E538 Deficiency of other specified B group vitamins: Secondary | ICD-10-CM | POA: Diagnosis not present

## 2018-08-04 ENCOUNTER — Ambulatory Visit (HOSPITAL_COMMUNITY)
Admission: RE | Admit: 2018-08-04 | Discharge: 2018-08-04 | Disposition: A | Discharge status: Home / Self Care | Payer: Medicare Other | Source: Ambulatory Visit | Attending: Oncology | Admitting: Oncology

## 2018-08-04 ENCOUNTER — Other Ambulatory Visit: Payer: Self-pay

## 2018-08-04 DIAGNOSIS — K8020 Calculus of gallbladder without cholecystitis without obstruction: Secondary | ICD-10-CM | POA: Insufficient documentation

## 2018-08-04 DIAGNOSIS — C50919 Malignant neoplasm of unspecified site of unspecified female breast: Secondary | ICD-10-CM | POA: Insufficient documentation

## 2018-08-04 DIAGNOSIS — Z170 Estrogen receptor positive status [ER+]: Secondary | ICD-10-CM | POA: Diagnosis not present

## 2018-08-04 DIAGNOSIS — C50811 Malignant neoplasm of overlapping sites of right female breast: Secondary | ICD-10-CM | POA: Insufficient documentation

## 2018-08-04 DIAGNOSIS — R911 Solitary pulmonary nodule: Secondary | ICD-10-CM | POA: Diagnosis not present

## 2018-08-04 DIAGNOSIS — C7951 Secondary malignant neoplasm of bone: Secondary | ICD-10-CM | POA: Diagnosis not present

## 2018-08-04 DIAGNOSIS — I700 Atherosclerosis of aorta: Secondary | ICD-10-CM | POA: Diagnosis not present

## 2018-08-04 MED ORDER — FLUDEOXYGLUCOSE F - 18 (FDG) INJECTION
8.3400 | Freq: Once | INTRAVENOUS | Status: AC | PRN
  Administered 2018-08-04: 14:00:00 8.34 via INTRAVENOUS

## 2018-08-05 DIAGNOSIS — L570 Actinic keratosis: Secondary | ICD-10-CM | POA: Diagnosis not present

## 2018-08-05 DIAGNOSIS — E782 Mixed hyperlipidemia: Secondary | ICD-10-CM | POA: Diagnosis not present

## 2018-08-05 DIAGNOSIS — D485 Neoplasm of uncertain behavior of skin: Secondary | ICD-10-CM | POA: Diagnosis not present

## 2018-08-05 DIAGNOSIS — L719 Rosacea, unspecified: Secondary | ICD-10-CM | POA: Diagnosis not present

## 2018-08-05 LAB — GLUCOSE, CAPILLARY: Glucose-Capillary: 96 mg/dL (ref 70–99)

## 2018-08-09 ENCOUNTER — Telehealth: Payer: Self-pay | Admitting: Pharmacist

## 2018-08-09 NOTE — Telephone Encounter (Signed)
Oral Chemotherapy Pharmacist Encounter   Spoke with patient to follow up regarding patient's oral chemotherapy medication: Ibrance (palbociclib) for the treatment of metastatic, hormone receptor positive, Her-2 negative breast cancer in combination withFaslodexuntil disease progression or unacceptable toxicity.  Original Start date of oral chemotherapy:04/21/2016 Ibrance re-start: 06/29/2017 on 1 capsule (75 mg) by mouth once daily with whole food, take for 21 days on, 7 days off, repeat every 28 days  Dose changed to1 capsule (75 mg) by mouth onceevery other daywith whole food, take for 21 days on, 7 days off, repeat every 28 days on 08/03/2017  Pt reports0tablets/doses of Ibrance 11m caspules,1 capsule (75 mg) by mouth onceevery other daywith whole food, take for 21 days on, 7 days off, repeat every 28 days,missed in the last month  Pt reports the following side effects: mild, manageable fatigue, unchanged  Pertinent labs reviewed: OK for continued treatment, PET scan performed 08/05/2018 shows stable disease.  Dispensed samples to patient:  Medication:Ibrance 759mcapsules Instructions:Take1 capsule (75 mg) by mouth onceevery other daywith whole food, take for 21 days on, 7 days off, repeat every 28 days Quantity dispensed:21 Days supply:55 Manufacturer:Pfizer LoLMB:[id number]:08/2018  Patient will pick up Ibrance bottle while in the office on 08/10/2018.  Patient informed that [facility] be changing to tablet formulation at some point in 2020, and will be dispensed in a box of 3 cards as opposed to current dispensing of a bottle with 21 capsules.  Patient knows to call the office with questions or concerns. Oral Oncology Clinic will continue to follow.  JeJohny DrillingPharmD, BCPS, BCOP  08/09/2018  11:51 AM  Oral Oncology Clinic [phone]

## 2018-08-09 NOTE — Progress Notes (Signed)
[place]  [phone] [phone]) [phone]   ID: Jade Mooney DOB: 17-Dec-1945  MR#: [id number]  SNK#:[id number]  Patient Care Team: Jade Mooney, Jade Mt, MD as PCP - General (Family Medicine) Jade Essex, MD as Consulting Physician (Gastroenterology) Jade Mooney, Virgie Dad, MD as Consulting Physician (Oncology) Jade Arabian, MD as Consulting Physician (Orthopedic Surgery) Jade Mayer, MD as Consulting Physician (Radiation Oncology) Jade Miss, MD as Consulting Physician (Neurosurgery) Jade Kaplan, MD as Consulting Physician (Oncology)   CHIEF COMPLAINT: Newly diagnosed metastatic breast cancer  CURRENT TREATMENT: fulvestrant, denosumab/Xgeva, palbociclib   INTERVAL HISTORY: Jade Mooney returns today for follow-up and treatment of her estrogen receptor positive breast cancer.   Jade Mooney continues on palbociclib/Ibrance at 75 mg every other day, 21 days on 7 days off. Jade Mooney denies fatigue, nausea, and pain issues.   Jade Mooney also continues on fulvestrant received every 4 weeks.  Jade Mooney tolerates that with no side effects that Jade Mooney is aware of.  Finally, Jade Mooney also continues on denosumab/Xgeva, received every 4 weeks.  Jade Mooney also tolerates this well and specifically has had no dental issues.  Since her last visit here, Jade Mooney underwent restaging PET scan on 08/04/2018. This revealed: stable exam, no new or progressive findings on today's study to suggest recurrent or metastatic disease; 4 mm perifissural nodule right lung appears new since prior study, probably subpleural lymph node but attention on follow up recommended; widespread sclerotic bone metastases without hypermetabolism, stable.  Lab Results  Component Value Date   CA2729 35.6 05/11/2018   CA2729 40.6 (H) 02/16/2018   CA2729 58.8 (H) 11/24/2017   CA2729 56.4 (H) 08/24/2017   CA2729 72.1 (H) 06/29/2017    REVIEW OF SYSTEMS: [name] reports doing well overall. Jade Mooney and her husband walk 30-40  minutes for exercise. The patient denies unusual headaches, visual changes, nausea, vomiting, stiff neck, dizziness, or gait imbalance. There has been no cough, phlegm production, or pleurisy, no chest pain or pressure, and no change in bowel or bladder habits. The patient denies fever, rash, bleeding, unexplained fatigue or unexplained weight loss. A detailed review of systems was otherwise entirely negative.   BREAST CANCER HISTORY: From the earlier summary notes:  Jade Mooney was referred to Dr. Clarene Mooney for evaluation of abdominal discomfort and nausea. Exam and blood work including liver function tests was unremarkable aside from normochromic normocytic anemia. Cholecystitis was suspected and the patient underwent endoscopy followed by abdominal/ pelvic CT scan on 09/29/2013. This showed showed a normal gallbladder. Incidental findings included multiple simple cysts in the liver and aortic atherosclerosis. However mixed lytic and sclerotic bony lesions were noted. On 10/10/2013 the patient underwent biopsy of the right iliac bone, and this showed ([id number]) metastatic invasive ductal carcinoma, grade 2, estrogen and progesterone receptor strongly positive.  Jade Mooney has a remote history of breast cancer, which we tried to reconstruct. Jade Mooney was originally diagnosed early in 1997 with stage III disease, and underwent right mastectomy followed by adjuvant chemotherapy. Jade Mooney then participated in a [facility] protocol with high-dose chemotherapy followed by stem cell rescue 12/22/1995. Unfortunately later that year liver lesions were noted and were biopsy-proven to be metastatic breast cancer. This was not felt to be resectable. However the tumor was estrogen receptor positive and HER-2 positive. The patient was treated with Herceptin (Jade Mooney does not know for what period of time) and was then on tamoxifen until November of 2002. Jade Mooney also participated in a vaccine study at [facility].  Jade Mooney's subsequent history is as  detailed below   PAST  MEDICAL HISTORY: Past Medical History:  Diagnosis Date   Anxiety    Cancer [facility])    Breast 1997 right tx with mastectomy and chemo, metastatic now   GERD (gastroesophageal reflux disease)    Hypercholesterolemia    Hypertension    Hypothyroidism    Osteoarthritis    oa   Personal history of chemotherapy    Personal history of radiation therapy    TIA (transient ischemic attack) last 11-08-15   x 2 total    PAST SURGICAL HISTORY: Past Surgical History:  Procedure Laterality Date   CATARACT EXTRACTION, BILATERAL     MASTECTOMY MODIFIED RADICAL Right 1997   porta cath insertion  1997   later removed   radiation tx  finished 02-25-16   x 20 tx   TONSILLECTOMY     TOTAL HIP ARTHROPLASTY Left 03/12/2016   Procedure: LEFT TOTAL HIP ARTHROPLASTY ANTERIOR APPROACH;  Surgeon: Jade Arabian, MD;  Location: [facility];  Service: Orthopedics;  Laterality: Left;   TOTAL KNEE ARTHROPLASTY Left    TUBAL LIGATION      FAMILY HISTORY: Family History  Problem Relation Age of Onset   Breast cancer Maternal Aunt    The patient's father died at the age of 20 from heart disease. The patient's mother died at the age of 69. The patient had no brothers, one sister. One of the patient's mother's 5 sisters was diagnosed with breast cancer in her 47s   GYNECOLOGIC HISTORY:  No LMP recorded. Patient is postmenopausal. Menarche age 81, the patient is GX P0. Jade Mooney stopped having periods approximately 1994. Jade Mooney used hormone replacement until 1997. 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 Her husband Jade Mooney (goes by "Jade Mooney" was [employer]. of information all services at [employer]. They live alone, with no pets.   ADVANCED DIRECTIVES: In place   HEALTH MAINTENANCE: Social History   Tobacco Use   Smoking status: Never Smoker   Smokeless tobacco: Never  Used  Substance Use Topics   Alcohol use: Yes    Comment: 1 glass wine 4-5 x week   Drug use: No    Colonoscopy: 2013  PAP: 2010  Bone density: 2012  Lipid panel:  Allergies  Allergen Reactions   Amoxicillin Hives    Has patient had a PCN reaction causing immediate rash, facial/tongue/throat swelling, SOB or lightheadedness with hypotension:unsure Has patient had a PCN reaction causing severe rash involving mucus membranes or skin necrosis:No Has patient had a PCN reaction that required hospitalization:No Has patient had a PCN reaction occurring within the last 10 years: Yes If all of the above answers are "NO", then may proceed with Cephalosporin use.    Percocet [Oxycodone-Acetaminophen] Nausea And Vomiting   Percodan [Oxycodone-Aspirin] Nausea And Vomiting   Tramadol Itching    Current Outpatient Medications  Medication Sig Dispense Refill   acetaminophen (TYLENOL) 500 MG tablet Take 1,000 mg by mouth every 6 (six) hours as needed for mild pain. Reported on 04/17/2015     calcium carbonate (OSCAL) 1500 (600 Ca) MG TABS tablet Take 600 mg of elemental calcium by mouth 2 (two) times daily with a meal.     cholecalciferol (VITAMIN D) 1000 units tablet Take 1,000 Units by mouth daily.     clopidogrel (PLAVIX) 75 MG tablet Take 1 tablet (75 mg total) by mouth daily.     cyanocobalamin (,VITAMIN B-12,) 1000 MCG/ML injection Inject  1,000 mcg into the muscle once.     levothyroxine (SYNTHROID, LEVOTHROID) 88 MCG tablet Take 88 mcg by mouth daily before breakfast.     Lutein 6 MG TABS Take 6 mg by mouth 2 (two) times daily.      magnesium gluconate (MAGONATE) 500 MG tablet Take 500 mg by mouth at bedtime.     meclizine (ANTIVERT) 25 MG tablet Take 25 mg by mouth 3 (three) times daily as needed for dizziness.     METRONIDAZOLE, TOPICAL, 0.75 % LOTN Apply 1 application topically at bedtime. Apply to face for rosacea     palbociclib (IBRANCE) 75 MG capsule Take 1 capsule  (75 mg total) by mouth daily with breakfast. Take whole with food on days 1-21, every 28 days. 21 capsule 2   pantoprazole (PROTONIX) 40 MG tablet Take 40 mg by mouth 2 (two) times daily. Before breakfast and before supper     Polyethyl Glycol-Propyl Glycol (SYSTANE ULTRA) 0.4-0.3 % SOLN Place 1-2 drops into both eyes 2 (two) times daily.     polyethylene glycol (MIRALAX / GLYCOLAX) packet Take 4 g by mouth daily. 1 teaspoonful in the morning     rOPINIRole (REQUIP) 0.5 MG tablet Take 0.5 mg by mouth 3 (three) times daily.     simvastatin (ZOCOR) 40 MG tablet Take 40 mg by mouth at bedtime.      traZODone (DESYREL) 50 MG tablet Take 25 mg by mouth at bedtime.      No current facility-administered medications for this visit.     OBJECTIVE: Older white woman who appears stated age  24:   08/10/18 1113  BP: (!) 151/66  Pulse: 63  Resp: 18  Temp: 98.3 F (36.8 C)  SpO2: 100%     Body mass index is 29.9 kg/m.    ECOG FS:1 - Symptomatic but completely ambulatory  Sclerae unicteric, pupils round and equal Mask No cervical or supraclavicular adenopathy Lungs no rales or rhonchi Heart regular rate and rhythm Abd soft, nontender, positive bowel sounds MSK no focal spinal tenderness, no upper extremity lymphedema Neuro: nonfocal, well oriented, appropriate affect Breasts: Deferred  LAB RESULTS: CMP     Component Value Date/Time   NA 138 08/10/2018 1045   NA 138 02/09/2017 1213   K 4.0 08/10/2018 1045   K 3.9 02/09/2017 1213   CL 103 08/10/2018 1045   CO2 25 08/10/2018 1045   CO2 27 02/09/2017 1213   GLUCOSE 102 (H) 08/10/2018 1045   GLUCOSE 100 02/09/2017 1213   BUN 14 08/10/2018 1045   BUN 12.0 02/09/2017 1213   CREATININE 1.01 (H) 08/10/2018 1045   CREATININE 0.9 02/09/2017 1213   CALCIUM 9.0 08/10/2018 1045   CALCIUM 8.7 02/09/2017 1213   PROT 6.9 08/10/2018 1045   PROT 6.5 02/09/2017 1213   ALBUMIN 4.3 08/10/2018 1045   ALBUMIN 4.1 02/09/2017 1213   AST 19  08/10/2018 1045   AST 24 02/09/2017 1213   ALT 13 08/10/2018 1045   ALT 18 02/09/2017 1213   ALKPHOS 62 08/10/2018 1045   ALKPHOS 62 02/09/2017 1213   BILITOT 0.4 08/10/2018 1045   BILITOT 0.34 02/09/2017 1213   GFRNONAA 55 (L) 08/10/2018 1045   GFRAA >60 08/10/2018 1045   No results found for: SPEP  Lab Results  Component Value Date   WBC 3.0 (L) 08/10/2018   NEUTROABS 1.8 08/10/2018   HGB 13.4 08/10/2018   HCT 40.4 08/10/2018   MCV 90.8 08/10/2018   PLT 222 08/10/2018  Chemistry      Component Value Date/Time   NA 138 08/10/2018 1045   NA 138 02/09/2017 1213   K 4.0 08/10/2018 1045   K 3.9 02/09/2017 1213   CL 103 08/10/2018 1045   CO2 25 08/10/2018 1045   CO2 27 02/09/2017 1213   BUN 14 08/10/2018 1045   BUN 12.0 02/09/2017 1213   CREATININE 1.01 (H) 08/10/2018 1045   CREATININE 0.9 02/09/2017 1213      Component Value Date/Time   CALCIUM 9.0 08/10/2018 1045   CALCIUM 8.7 02/09/2017 1213   ALKPHOS 62 08/10/2018 1045   ALKPHOS 62 02/09/2017 1213   AST 19 08/10/2018 1045   AST 24 02/09/2017 1213   ALT 13 08/10/2018 1045   ALT 18 02/09/2017 1213   BILITOT 0.4 08/10/2018 1045   BILITOT 0.34 02/09/2017 1213      No results for input(s): INR in the last 168 hours.  Urinalysis    Component Value Date/Time   COLORURINE STRAW (A) 03/05/2016 1350    STUDIES:  Nm Pet Image Restag (ps) Skull Base To Thigh  Result Date: 08/05/2018 CLINICAL DATA:  Subsequent treatment strategy for breast cancer. EXAM: NUCLEAR MEDICINE PET SKULL BASE TO THIGH TECHNIQUE: 8.3 mCi F-18 FDG was injected intravenously. Full-ring PET imaging was performed from the skull base to thigh after the radiotracer. CT data was obtained and used for attenuation correction and anatomic localization. Fasting blood glucose: 96 mg/dl COMPARISON:  02/11/2018 FINDINGS: Mediastinal blood pool activity: SUV max 2.5 Liver activity: SUV max NA NECK: No hypermetabolic lymph nodes in the neck. Incidental  CT findings: none CHEST: No hypermetabolic mediastinal or hilar nodes. No suspicious pulmonary nodules on the CT scan. Tiny calcified subcutaneous nodule superior medial left breast is stable in the interval with low level FDG uptake, likely infectious/inflammatory. Incidental CT findings: Coronary artery calcification is evident. Atherosclerotic calcification is noted in the wall of the thoracic aorta. 4 mm perifissural nodule in the right lung (39/8) is new in the interval. ABDOMEN/PELVIS: No abnormal hypermetabolic activity within the liver, pancreas, adrenal glands, or spleen. No hypermetabolic lymph nodes in the abdomen or pelvis. Incidental CT findings: Partially calcified gallstones evident. There is abdominal aortic atherosclerosis without aneurysm. Left colonic diverticulosis without diverticulitis. SKELETON: Diffuse sclerotic bone metastases again noted without hypermetabolism. Incidental CT findings: Left hip replacement. IMPRESSION: 1. Stable exam. No new or progressive hypermetabolic findings on today's study to suggest recurrent or metastatic disease. 2. 4 mm perifissural nodule right lung appears new since prior study. Probably subpleural lymph node, but attention on follow-up recommended. 3. Widespread sclerotic bone metastases without hypermetabolism, stable. 4. Cholelithiasis. 5.  Aortic Atherosclerois (ICD10-170.0) Electronically Signed   By: Misty Stanley M.D.   On: 08/05/2018 09:06     ASSESSMENT: 73 y.o. [place] woman with stage IV right breast cancer as of NOV 2007 (bone metastases, subcutaneous nodules)  (1) s/p Right mastectomy 1997 for an estrogen receptor and HER-2 positive breast cancer, treated with  (a) adjuvant chemotherapy according to CALGB 9640 (taxotere x 4 cycles)  (b) high-dose chemotherapy (cyclophosphamide, carboplatin, BCNU) followed by stem cell rescue at [place] 12/22/1995  (c) biopsy-proven metastatic disease November 2007, estrogen receptor and HER-2 positive  (d)  trastuzumab (dose? Duration?)  (e) tamoxifen for 5 years completed 2002  (f) participation in a vaccine study at [facility] 2003 (CEA primed dendritic cells, HER-2 primed dendritic cells)  (2) status post right iliac crest biopsy 10/10/2013 for invasive ductal carcinoma, grade 2, estrogen and progesterone receptor  positive  (3) zolendronate started 10/12/2013,  repeated every 12 weeks  (a) dexa scan 07/28/2013 normal  (b) zolendronate discontinued after January 2019 dose  (4) biopsy of a scalp lesion 10/17/2013 showed metastatic breast cancer, HER-2/neu negative  (a) biopsy of a right anterior chest wall nodule August 2017 shows adenocarcinoma  (5) anastrozole started 10/28/2013, changed to letrozole 06/27/2016  (a) measurable disease = subcutaneous nodules in scalp and LLQ abdomen  (b)  CA-27-29 is informative  (c)  Repeat PET scan 12/28/2014 shows continuing response ; exam shows resolution of scalp lesions  (d) new skin lesion removed from right anterior chest wall August 2017  (e) status post radiation to the anterior lower right chest wall completed 02/25/2017 (20 sessions)   (f) PET scan 04/04/2016 shows 2 new foci of metabolic activity (T8 and manubrium)  (g) palbociclib added 04/21/2016 at 125 mg 21/7  (h) palbociclib dose dropped to 100 mg 21/7 starting with the March 2018 cycle  (i) bone scan 09/26/2016 is stable  (j) PET scan 02/03/2017 shows her bone metastases, and in addition a 0.5 cm right middle lobe nodule which may be new  (k) MRI of the cervical spine shows collapse of the left lateral mass of C3.  There is no soft tissue mass or focal neural impingement.  (l) palbociclib reduced to 75 mg as of September 2018  (m) palbociclib held during cervical spine irradiation finished in mid February, 2019  (n) letrozole discontinued February 2019 secondary to likely progression   (6) neoplasia associated pain  (a) painful blastic lesion left femoral intertrochanteric region: Status  post radiation completed November 2015  (b) pain left knee, status post TKR remotely  (c) left total hip replacement 03/12/2016  (7) palliative radiation to the cervical spine completed 05/08/2017 in [place]  (8) fulvestrant started 05/04/2017  (a) Palbociclib resumed 06/29/2017  (9) denosumab/Xgeva started 06/01/2017, repeated every 28 days  (10) follow-up/staging studies  (a) PET scan 08/11/2017 shows no evidence for metabolically active tumor.  Sclerotic bone mets do not show hypermetabolism  (b) PET scan 02/11/2018 shows continuing excellent tumor control  (c) PET scan 08/05/2018 shows same bone lesions but no active disease   PLAN: Jade Mooney is now 12-1/2 years out from definitive diagnosis of metastatic breast cancer with very well-controlled disease.  This is very favorable.  Jade Mooney continues to tolerate her fulvestrant and palbociclib remarkably well and the plan is to continue these until there is definite evidence of disease progression  Her counts today are excellent especially considering that Jade Mooney is completing her third week of the current cycle of palbociclib  Her CA-27-29 was in the normal range when we last checked it 3 months ago  Jade Mooney will receive palbociclib today through our oral chemotherapy pharmacy and of course also received the denosumab/Xgeva and fulvestrant.  Jade Mooney will continue these in the [place] cancer clinic for the next 2 months and then return to see me in August.  We will likely repeat a PET scan sometime in November  Jade Mooney knows to call for any other issue that may develop before the next scheduled visit here.     Taher Vannote, Virgie Dad, MD  08/10/18 11:34 AM Medical Oncology and Hematology [facility] [address] [place], [place] [address] Tel. [phone]    Fax. [phone]   I, Wilburn Mylar, am acting as scribe for Dr. Virgie Dad. Javione Gunawan.  Lindie Spruce MD, have reviewed the above documentation for accuracy and  completeness, and I agree with  the above.

## 2018-08-10 ENCOUNTER — Inpatient Hospital Stay: Payer: Medicare Other

## 2018-08-10 ENCOUNTER — Inpatient Hospital Stay: Payer: Medicare Other | Attending: Oncology | Admitting: Oncology

## 2018-08-10 ENCOUNTER — Other Ambulatory Visit: Payer: Self-pay

## 2018-08-10 VITALS — BP 151/66 | HR 63 | Temp 98.3°F | Resp 18 | Ht 63.5 in | Wt 171.5 lb

## 2018-08-10 DIAGNOSIS — C50811 Malignant neoplasm of overlapping sites of right female breast: Secondary | ICD-10-CM

## 2018-08-10 DIAGNOSIS — Z5111 Encounter for antineoplastic chemotherapy: Secondary | ICD-10-CM | POA: Insufficient documentation

## 2018-08-10 DIAGNOSIS — C7951 Secondary malignant neoplasm of bone: Secondary | ICD-10-CM | POA: Diagnosis not present

## 2018-08-10 DIAGNOSIS — R911 Solitary pulmonary nodule: Secondary | ICD-10-CM | POA: Diagnosis not present

## 2018-08-10 DIAGNOSIS — C50912 Malignant neoplasm of unspecified site of left female breast: Secondary | ICD-10-CM

## 2018-08-10 DIAGNOSIS — Z170 Estrogen receptor positive status [ER+]: Secondary | ICD-10-CM | POA: Diagnosis not present

## 2018-08-10 DIAGNOSIS — C50919 Malignant neoplasm of unspecified site of unspecified female breast: Secondary | ICD-10-CM

## 2018-08-10 DIAGNOSIS — C50911 Malignant neoplasm of unspecified site of right female breast: Secondary | ICD-10-CM

## 2018-08-10 LAB — COMPREHENSIVE METABOLIC PANEL
ALT: 13 U/L (ref 0–44)
AST: 19 U/L (ref 15–41)
Albumin: 4.3 g/dL (ref 3.5–5.0)
Alkaline Phosphatase: 62 U/L (ref 38–126)
Anion gap: 10 (ref 5–15)
BUN: 14 mg/dL (ref 8–23)
CO2: 25 mmol/L (ref 22–32)
Calcium: 9 mg/dL (ref 8.9–10.3)
Chloride: 103 mmol/L (ref 98–111)
Creatinine, Ser: 1.01 mg/dL — ABNORMAL HIGH (ref 0.44–1.00)
GFR calc Af Amer: >60 mL/min (ref >60)
GFR calc non Af Amer: 55 mL/min — ABNORMAL LOW (ref >60)
Glucose, Bld: 102 mg/dL — ABNORMAL HIGH (ref 70–99)
Potassium: 4 mmol/L (ref 3.5–5.1)
Sodium: 138 mmol/L (ref 135–145)
Total Bilirubin: 0.4 mg/dL (ref 0.3–1.2)
Total Protein: 6.9 g/dL (ref 6.5–8.1)

## 2018-08-10 LAB — CBC WITH DIFFERENTIAL/PLATELET
Abs Immature Granulocytes: 0.02 10*3/uL (ref 0.00–0.07)
Basophils Absolute: 0.1 10*3/uL (ref 0.0–0.1)
Basophils Relative: 2 %
Eosinophils Absolute: 0.1 10*3/uL (ref 0.0–0.5)
Eosinophils Relative: 3 %
HCT: 40.4 % (ref 36.0–46.0)
Hemoglobin: 13.4 g/dL (ref 12.0–15.0)
Immature Granulocytes: 1 %
Lymphocytes Relative: 20 %
Lymphs Abs: 0.6 10*3/uL — ABNORMAL LOW (ref 0.7–4.0)
MCH: 30.1 pg (ref 26.0–34.0)
MCHC: 33.2 g/dL (ref 30.0–36.0)
MCV: 90.8 fL (ref 80.0–100.0)
Monocytes Absolute: 0.4 10*3/uL (ref 0.1–1.0)
Monocytes Relative: 15 %
Neutro Abs: 1.8 10*3/uL (ref 1.7–7.7)
Neutrophils Relative %: 59 %
Platelets: 222 10*3/uL (ref 150–400)
RBC: 4.45 MIL/uL (ref 3.87–5.11)
RDW: 14.5 % (ref 11.5–15.5)
WBC: 3 10*3/uL — ABNORMAL LOW (ref 4.0–10.5)
nRBC: 0 % (ref 0.0–0.2)

## 2018-08-10 MED ORDER — DENOSUMAB 120 MG/1.7ML ~~LOC~~ SOLN
120.0000 mg | Freq: Once | SUBCUTANEOUS | Status: AC
  Administered 2018-08-10: 120 mg via SUBCUTANEOUS

## 2018-08-10 MED ORDER — PALBOCICLIB 75 MG PO CAPS: 75.0000 mg | ORAL_CAPSULE | Freq: Every day | ORAL | 2 refills | Status: DC

## 2018-08-10 MED ORDER — FULVESTRANT 250 MG/5ML IM SOLN
INTRAMUSCULAR | Status: AC
  Filled 2018-08-10: qty 10

## 2018-08-10 MED ORDER — FULVESTRANT 250 MG/5ML IM SOLN
500.0000 mg | Freq: Once | INTRAMUSCULAR | Status: AC
  Administered 2018-08-10: 500 mg via INTRAMUSCULAR

## 2018-08-10 NOTE — Telephone Encounter (Signed)
Oral Oncology Patient Advocate Encounter  I met the patient in the waiting room and gave her the sample bottle of Ibrance.  She verbalized appreciation.  [place] [facility] [place] Phone [phone] Fax [phone] 08/10/2018   1:18 PM

## 2018-08-10 NOTE — Patient Instructions (Signed)
Fulvestrant injection What is this medicine? FULVESTRANT (ful VES trant) blocks the effects of estrogen. It is used to treat breast cancer. This medicine may be used for other purposes; ask your health care provider or pharmacist if you have questions. COMMON BRAND NAME(S): FASLODEX What should I tell my health care provider before I take this medicine? They need to know if you have any of these conditions: -bleeding problems -liver disease -low levels of platelets in the blood -an unusual or allergic reaction to fulvestrant, other medicines, foods, dyes, or preservatives -pregnant or trying to get pregnant -breast-feeding How should I use this medicine? This medicine is for injection into a muscle. It is usually given by a health care professional in a hospital or clinic setting. Talk to your pediatrician regarding the use of this medicine in children. Special care may be needed. Overdosage: If you think you have taken too much of this medicine contact a poison control center or emergency room at once. NOTE: This medicine is only for you. Do not share this medicine with others. What if I miss a dose? It is important not to miss your dose. Call your doctor or health care professional if you are unable to keep an appointment. What may interact with this medicine? -medicines that treat or prevent blood clots like warfarin, enoxaparin, and dalteparin This list may not describe all possible interactions. Give your health care provider a list of all the medicines, herbs, non-prescription drugs, or dietary supplements you use. Also tell them if you smoke, drink alcohol, or use illegal drugs. Some items may interact with your medicine. What should I watch for while using this medicine? Your condition will be monitored carefully while you are receiving this medicine. You will need important blood work done while you are taking this medicine. Do not become pregnant while taking this medicine or for  at least 1 year after stopping it. Women of child-bearing potential will need to have a negative pregnancy test before starting this medicine. Women should inform their doctor if they wish to become pregnant or think they might be pregnant. There is a potential for serious side effects to an unborn child. Men should inform their doctors if they wish to father a child. This medicine may lower sperm counts. Talk to your health care professional or pharmacist for more information. Do not breast-feed an infant while taking this medicine or for 1 year after the last dose. What side effects may I notice from receiving this medicine? Side effects that you should report to your doctor or health care professional as soon as possible: -allergic reactions like skin rash, itching or hives, swelling of the face, lips, or tongue -feeling faint or lightheaded, falls -pain, tingling, numbness, or weakness in the legs -signs and symptoms of infection like fever or chills; cough; flu-like symptoms; sore throat -vaginal bleeding Side effects that usually do not require medical attention (report to your doctor or health care professional if they continue or are bothersome): -aches, pains -constipation -diarrhea -headache -hot flashes -nausea, vomiting -pain at site where injected -stomach pain This list may not describe all possible side effects. Call your doctor for medical advice about side effects. You may report side effects to FDA at 1-800-FDA-1088. Where should I keep my medicine? This drug is given in a hospital or clinic and will not be stored at home. NOTE: This sheet is a summary. It may not cover all possible information. If you have questions about this medicine, talk to your   doctor, pharmacist, or health care provider.  2018 [employer]/Gold Standard (2014-10-06 11:03:55) Denosumab injection What is this medicine? DENOSUMAB (den oh sue mab) slows bone breakdown. Prolia is used to treat osteoporosis in  women after menopause and in men. Delton See is used to treat a high calcium level due to cancer and to prevent bone fractures and other bone problems caused by multiple myeloma or cancer bone metastases. Delton See is also used to treat giant cell tumor of the bone. This medicine may be used for other purposes; ask your health care provider or pharmacist if you have questions. COMMON BRAND NAME(S): Prolia, XGEVA What should I tell my health care provider before I take this medicine? They need to know if you have any of these conditions: -dental disease -having surgery or tooth extraction -infection -kidney disease -low levels of calcium or Vitamin D in the blood -malnutrition -on hemodialysis -skin conditions or sensitivity -thyroid or parathyroid disease -an unusual reaction to denosumab, other medicines, foods, dyes, or preservatives -pregnant or trying to get pregnant -breast-feeding How should I use this medicine? This medicine is for injection under the skin. It is given by a health care professional in a hospital or clinic setting. If you are getting Prolia, a special MedGuide will be given to you by the pharmacist with each prescription and refill. Be sure to read this information carefully each time. For Prolia, talk to your pediatrician regarding the use of this medicine in children. Special care may be needed. For Delton See, talk to your pediatrician regarding the use of this medicine in children. While this drug may be prescribed for children as young as 13 years for selected conditions, precautions do apply. Overdosage: If you think you have taken too much of this medicine contact a poison control center or emergency room at once. NOTE: This medicine is only for you. Do not share this medicine with others. What if I miss a dose? It is important not to miss your dose. Call your doctor or health care professional if you are unable to keep an appointment. What may interact with this  medicine? Do not take this medicine with any of the following medications: -other medicines containing denosumab This medicine may also interact with the following medications: -medicines that lower your chance of fighting infection -steroid medicines like prednisone or cortisone This list may not describe all possible interactions. Give your health care provider a list of all the medicines, herbs, non-prescription drugs, or dietary supplements you use. Also tell them if you smoke, drink alcohol, or use illegal drugs. Some items may interact with your medicine. What should I watch for while using this medicine? Visit your doctor or health care professional for regular checks on your progress. Your doctor or health care professional may order blood tests and other tests to see how you are doing. Call your doctor or health care professional for advice if you get a fever, chills or sore throat, or other symptoms of a cold or flu. Do not treat yourself. This drug may decrease your body's ability to fight infection. Try to avoid being around people who are sick. You should make sure you get enough calcium and vitamin D while you are taking this medicine, unless your doctor tells you not to. Discuss the foods you eat and the vitamins you take with your health care professional. See your dentist regularly. Brush and floss your teeth as directed. Before you have any dental work done, tell your dentist you are receiving this medicine. Do  not become pregnant while taking this medicine or for 5 months after stopping it. Talk with your doctor or health care professional about your birth control options while taking this medicine. Women should inform their doctor if they wish to become pregnant or think they might be pregnant. There is a potential for serious side effects to an unborn child. Talk to your health care professional or pharmacist for more information. What side effects may I notice from receiving this  medicine? Side effects that you should report to your doctor or health care professional as soon as possible: -allergic reactions like skin rash, itching or hives, swelling of the face, lips, or tongue -bone pain -breathing problems -dizziness -jaw pain, especially after dental work -redness, blistering, peeling of the skin -signs and symptoms of infection like fever or chills; cough; sore throat; pain or trouble passing urine -signs of low calcium like fast heartbeat, muscle cramps or muscle pain; pain, tingling, numbness in the hands or feet; seizures -unusual bleeding or bruising -unusually weak or tired Side effects that usually do not require medical attention (report to your doctor or health care professional if they continue or are bothersome): -constipation -diarrhea -headache -joint pain -loss of appetite -muscle pain -runny nose -tiredness -upset stomach This list may not describe all possible side effects. Call your doctor for medical advice about side effects. You may report side effects to FDA at 1-800-FDA-1088. Where should I keep my medicine? This medicine is only given in a clinic, doctor's office, or other health care setting and will not be stored at home. NOTE: This sheet is a summary. It may not cover all possible information. If you have questions about this medicine, talk to your doctor, pharmacist, or health care provider.  2018 [employer]/Gold Standard (2016-04-01 19:17:21)  

## 2018-08-11 DIAGNOSIS — L728 Other follicular cysts of the skin and subcutaneous tissue: Secondary | ICD-10-CM | POA: Diagnosis not present

## 2018-08-11 DIAGNOSIS — L570 Actinic keratosis: Secondary | ICD-10-CM | POA: Diagnosis not present

## 2018-08-11 DIAGNOSIS — L821 Other seborrheic keratosis: Secondary | ICD-10-CM | POA: Diagnosis not present

## 2018-08-11 LAB — CANCER ANTIGEN 27.29: CA 27.29: 42.3 U/mL — ABNORMAL HIGH (ref 0.0–38.6)

## 2018-08-21 DIAGNOSIS — K219 Gastro-esophageal reflux disease without esophagitis: Secondary | ICD-10-CM | POA: Diagnosis not present

## 2018-08-21 DIAGNOSIS — E782 Mixed hyperlipidemia: Secondary | ICD-10-CM | POA: Diagnosis not present

## 2018-09-02 DIAGNOSIS — E538 Deficiency of other specified B group vitamins: Secondary | ICD-10-CM | POA: Diagnosis not present

## 2018-09-07 DIAGNOSIS — C50919 Malignant neoplasm of unspecified site of unspecified female breast: Secondary | ICD-10-CM | POA: Diagnosis not present

## 2018-09-07 DIAGNOSIS — C7951 Secondary malignant neoplasm of bone: Secondary | ICD-10-CM | POA: Diagnosis not present

## 2018-09-07 DIAGNOSIS — Z5111 Encounter for antineoplastic chemotherapy: Secondary | ICD-10-CM | POA: Diagnosis not present

## 2018-09-17 DIAGNOSIS — C419 Malignant neoplasm of bone and articular cartilage, unspecified: Secondary | ICD-10-CM | POA: Diagnosis not present

## 2018-09-17 DIAGNOSIS — M47892 Other spondylosis, cervical region: Secondary | ICD-10-CM | POA: Diagnosis not present

## 2018-09-17 DIAGNOSIS — M4310 Spondylolisthesis, site unspecified: Secondary | ICD-10-CM | POA: Diagnosis not present

## 2018-09-21 DIAGNOSIS — E782 Mixed hyperlipidemia: Secondary | ICD-10-CM | POA: Diagnosis not present

## 2018-09-21 DIAGNOSIS — K219 Gastro-esophageal reflux disease without esophagitis: Secondary | ICD-10-CM | POA: Diagnosis not present

## 2018-09-29 ENCOUNTER — Telehealth: Payer: Self-pay | Admitting: Oncology

## 2018-09-29 NOTE — Telephone Encounter (Signed)
Scheduled appt per 7/7 sch message- pt aware of appt date and time

## 2018-10-01 DIAGNOSIS — D519 Vitamin B12 deficiency anemia, unspecified: Secondary | ICD-10-CM | POA: Diagnosis not present

## 2018-10-05 ENCOUNTER — Encounter: Payer: Self-pay | Admitting: Oncology

## 2018-10-05 DIAGNOSIS — C7951 Secondary malignant neoplasm of bone: Secondary | ICD-10-CM | POA: Diagnosis not present

## 2018-10-05 DIAGNOSIS — Z5111 Encounter for antineoplastic chemotherapy: Secondary | ICD-10-CM | POA: Diagnosis not present

## 2018-10-05 DIAGNOSIS — C50919 Malignant neoplasm of unspecified site of unspecified female breast: Secondary | ICD-10-CM | POA: Diagnosis not present

## 2018-10-22 DIAGNOSIS — K219 Gastro-esophageal reflux disease without esophagitis: Secondary | ICD-10-CM | POA: Diagnosis not present

## 2018-10-22 DIAGNOSIS — E538 Deficiency of other specified B group vitamins: Secondary | ICD-10-CM | POA: Diagnosis not present

## 2018-10-29 DIAGNOSIS — Z79899 Other long term (current) drug therapy: Secondary | ICD-10-CM | POA: Diagnosis not present

## 2018-10-29 DIAGNOSIS — E039 Hypothyroidism, unspecified: Secondary | ICD-10-CM | POA: Diagnosis not present

## 2018-10-29 DIAGNOSIS — E785 Hyperlipidemia, unspecified: Secondary | ICD-10-CM | POA: Diagnosis not present

## 2018-10-29 DIAGNOSIS — E538 Deficiency of other specified B group vitamins: Secondary | ICD-10-CM | POA: Diagnosis not present

## 2018-11-01 NOTE — Progress Notes (Signed)
[place]  [phone] [phone]   ID: Jade Mooney DOB: 11/10/1945  MR#: [id number]  ZWC#:[id number]  Patient Care Team: Street, Sharon Mt, MD as PCP - General (Family Medicine) Clarene Essex, MD as Consulting Physician (Gastroenterology) Elroy Schembri, Virgie Dad, MD as Consulting Physician (Oncology) Gaynelle Arabian, MD as Consulting Physician (Orthopedic Surgery) Gatha Mayer, MD as Consulting Physician (Radiation Oncology) Kristeen Miss, MD as Consulting Physician (Neurosurgery) Derwood Kaplan, MD as Consulting Physician (Oncology)   CHIEF COMPLAINT: Newly diagnosed metastatic breast cancer (s/p right mastectomy)  CURRENT TREATMENT: fulvestrant, denosumab/Xgeva, palbociclib   INTERVAL HISTORY: Jade Mooney returns today for follow-up and treatment of her estrogen receptor positive breast cancer.   She continues on palbociclib/Ibrance at 75 mg every other day, 21 days on 7 days off. She is unsure what week she is in, but she is mid-cycle. She denies fatigue and pain issues. She notes some rare nausea.   She also continues on fulvestrant received every 4 weeks with a dose due today.  She tolerates that with no side effects that she is aware of, aside from soreness to the injection site for a few days.  Finally, she also continues on denosumab/Xgeva, received every 4 weeks with a dose due today.  She also tolerates this well and specifically has had no dental issues. She notes some mild bony aches the first couple of days following the shot.  Since her last visit, she has not undergone any additional studies. Her most recent mammogram was on 04/01/2018 at [facility].  We are following her CA-27-29: Lab Results  Component Value Date   CA2729 42.3 (H) 08/10/2018   CA2729 35.6 05/11/2018   CA2729 40.6 (H) 02/16/2018   CA2729 58.8 (H) 11/24/2017   CA2729 56.4 (H) 08/24/2017    REVIEW OF SYSTEMS: Jade Mooney reports her neck is  bothering her. She saw Dr. Ellene Route on 09/17/2018. She reports leg cramps at night. She notes she drinks plenty of water, takes magnesium. She was walking before summer started. She states she tries to ride her bike early in the morning. A detailed review of systems was otherwise entirely negative.   BREAST CANCER HISTORY: From the earlier summary notes:  Jade Mooney was referred to Dr. Clarene Essex for evaluation of abdominal discomfort and nausea. Exam and blood work including liver function tests was unremarkable aside from normochromic normocytic anemia. Cholecystitis was suspected and the patient underwent endoscopy followed by abdominal/ pelvic CT scan on 09/29/2013. This showed showed a normal gallbladder. Incidental findings included multiple simple cysts in the liver and aortic atherosclerosis. However mixed lytic and sclerotic bony lesions were noted. On 10/10/2013 the patient underwent biopsy of the right iliac bone, and this showed ([id number]) metastatic invasive ductal carcinoma, grade 2, estrogen and progesterone receptor strongly positive.  Jade Mooney has a remote history of breast cancer, which we tried to reconstruct. She was originally diagnosed early in 1997 with stage III disease, and underwent right mastectomy followed by adjuvant chemotherapy. She then participated in a [facility] protocol with high-dose chemotherapy followed by stem cell rescue 12/22/1995. Unfortunately later that year liver lesions were noted and were biopsy-proven to be metastatic breast cancer. This was not felt to be resectable. However the tumor was estrogen receptor positive and HER-2 positive. The patient was treated with Herceptin (she does not know for what period of time) and was then on tamoxifen until November of 2002. She also participated in a vaccine study at [facility].  Jade Mooney's subsequent history is as  detailed below   PAST MEDICAL HISTORY: Past Medical History:  Diagnosis Date   Anxiety    Cancer [facility])    Breast  1997 right tx with mastectomy and chemo, metastatic now   GERD (gastroesophageal reflux disease)    Hypercholesterolemia    Hypertension    Hypothyroidism    Osteoarthritis    oa   Personal history of chemotherapy    Personal history of radiation therapy    TIA (transient ischemic attack) last 11-08-15   x 2 total    PAST SURGICAL HISTORY: Past Surgical History:  Procedure Laterality Date   CATARACT EXTRACTION, BILATERAL     MASTECTOMY MODIFIED RADICAL Right 1997   porta cath insertion  1997   later removed   radiation tx  finished 02-25-16   x 20 tx   TONSILLECTOMY     TOTAL HIP ARTHROPLASTY Left 03/12/2016   Procedure: LEFT TOTAL HIP ARTHROPLASTY ANTERIOR APPROACH;  Surgeon: Gaynelle Arabian, MD;  Location: [facility];  Service: Orthopedics;  Laterality: Left;   TOTAL KNEE ARTHROPLASTY Left    TUBAL LIGATION      FAMILY HISTORY: Family History  Problem Relation Age of Onset   Breast cancer Maternal Aunt    The patient's father died at the age of 36 from heart disease. The patient's mother died at the age of 50. The patient had no brothers, one sister. One of the patient's mother's 5 sisters was diagnosed with breast cancer in her 57s   GYNECOLOGIC HISTORY:  No LMP recorded. Patient is postmenopausal. Menarche age 48, the patient is GX P0. She stopped having periods approximately 1994. She used hormone replacement until 1997. 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 Her husband Jade Mooney (goes by "Jade Mooney" was [employer]. of information all services at [employer]. They live alone, with no pets.   ADVANCED DIRECTIVES: In place   HEALTH MAINTENANCE: Social History   Tobacco Use   Smoking status: Never Smoker   Smokeless tobacco: Never Used  Substance Use Topics   Alcohol use: Yes    Comment: 1 glass wine 4-5 x week   Drug use: No    Colonoscopy:  2013  PAP: 2010  Bone density: 2012  Lipid panel:  Allergies  Allergen Reactions   Amoxicillin Hives    Has patient had a PCN reaction causing immediate rash, facial/tongue/throat swelling, SOB or lightheadedness with hypotension:unsure Has patient had a PCN reaction causing severe rash involving mucus membranes or skin necrosis:No Has patient had a PCN reaction that required hospitalization:No Has patient had a PCN reaction occurring within the last 10 years: Yes If all of the above answers are "NO", then may proceed with Cephalosporin use.    Percocet [Oxycodone-Acetaminophen] Nausea And Vomiting   Percodan [Oxycodone-Aspirin] Nausea And Vomiting   Tramadol Itching    Current Outpatient Medications  Medication Sig Dispense Refill   acetaminophen (TYLENOL) 500 MG tablet Take 1,000 mg by mouth every 6 (six) hours as needed for mild pain. Reported on 04/17/2015     calcium carbonate (OSCAL) 1500 (600 Ca) MG TABS tablet Take 600 mg of elemental calcium by mouth 2 (two) times daily with a meal.     cholecalciferol (VITAMIN D) 1000 units tablet Take 1,000 Units by mouth daily.     clopidogrel (PLAVIX) 75 MG tablet Take 1 tablet (75 mg total) by mouth daily.     cyanocobalamin (,  VITAMIN B-12,) 1000 MCG/ML injection Inject 1,000 mcg into the muscle once.     levothyroxine (SYNTHROID, LEVOTHROID) 88 MCG tablet Take 88 mcg by mouth daily before breakfast.     Lutein 6 MG TABS Take 6 mg by mouth 2 (two) times daily.      magnesium gluconate (MAGONATE) 500 MG tablet Take 500 mg by mouth at bedtime.     meclizine (ANTIVERT) 25 MG tablet Take 25 mg by mouth 3 (three) times daily as needed for dizziness.     METRONIDAZOLE, TOPICAL, 0.75 % LOTN Apply 1 application topically at bedtime. Apply to face for rosacea     palbociclib (IBRANCE) 75 MG capsule Take 1 capsule (75 mg total) by mouth daily with breakfast. Take whole with food on days 1-21, every 28 days. 21 capsule 2    pantoprazole (PROTONIX) 40 MG tablet Take 40 mg by mouth 2 (two) times daily. Before breakfast and before supper     Polyethyl Glycol-Propyl Glycol (SYSTANE ULTRA) 0.4-0.3 % SOLN Place 1-2 drops into both eyes 2 (two) times daily.     polyethylene glycol (MIRALAX / GLYCOLAX) packet Take 4 g by mouth daily. 1 teaspoonful in the morning     rOPINIRole (REQUIP) 0.5 MG tablet Take 0.5 mg by mouth 3 (three) times daily.     simvastatin (ZOCOR) 40 MG tablet Take 40 mg by mouth at bedtime.      traZODone (DESYREL) 50 MG tablet Take 25 mg by mouth at bedtime.      No current facility-administered medications for this visit.     OBJECTIVE: Older white woman who appears stated age  43:   11/02/18 1315  BP: (!) 142/65  Pulse: 69  Resp: 20  Temp: 98.7 F (37.1 C)  SpO2: 100%     Body mass index is 30.31 kg/m.    ECOG FS:1 - Symptomatic but completely ambulatory  Sclerae unicteric, EOMs intact Wearing a mask No cervical or supraclavicular adenopathy Lungs no rales or rhonchi Heart regular rate and rhythm Abd soft, nontender, positive bowel sounds MSK no focal spinal tenderness, no upper extremity lymphedema Neuro: nonfocal, well oriented, appropriate affect Breasts: The right breast is status post mastectomy.  There is no evidence of chest wall recurrence.  The left breast is benign.  Both axillae are benign.  LAB RESULTS: CMP     Component Value Date/Time   NA 138 08/10/2018 1045   NA 138 02/09/2017 1213   K 4.0 08/10/2018 1045   K 3.9 02/09/2017 1213   CL 103 08/10/2018 1045   CO2 25 08/10/2018 1045   CO2 27 02/09/2017 1213   GLUCOSE 102 (H) 08/10/2018 1045   GLUCOSE 100 02/09/2017 1213   BUN 14 08/10/2018 1045   BUN 12.0 02/09/2017 1213   CREATININE 1.01 (H) 08/10/2018 1045   CREATININE 0.9 02/09/2017 1213   CALCIUM 9.0 08/10/2018 1045   CALCIUM 8.7 02/09/2017 1213   PROT 6.9 08/10/2018 1045   PROT 6.5 02/09/2017 1213   ALBUMIN 4.3 08/10/2018 1045   ALBUMIN 4.1  02/09/2017 1213   AST 19 08/10/2018 1045   AST 24 02/09/2017 1213   ALT 13 08/10/2018 1045   ALT 18 02/09/2017 1213   ALKPHOS 62 08/10/2018 1045   ALKPHOS 62 02/09/2017 1213   BILITOT 0.4 08/10/2018 1045   BILITOT 0.34 02/09/2017 1213   GFRNONAA 55 (L) 08/10/2018 1045   GFRAA >60 08/10/2018 1045   No results found for: SPEP  Lab Results  Component Value Date  WBC 3.3 (L) 11/02/2018   NEUTROABS 2.3 11/02/2018   HGB 12.6 11/02/2018   HCT 38.2 11/02/2018   MCV 92.0 11/02/2018   PLT 188 11/02/2018      Chemistry      Component Value Date/Time   NA 138 08/10/2018 1045   NA 138 02/09/2017 1213   K 4.0 08/10/2018 1045   K 3.9 02/09/2017 1213   CL 103 08/10/2018 1045   CO2 25 08/10/2018 1045   CO2 27 02/09/2017 1213   BUN 14 08/10/2018 1045   BUN 12.0 02/09/2017 1213   CREATININE 1.01 (H) 08/10/2018 1045   CREATININE 0.9 02/09/2017 1213      Component Value Date/Time   CALCIUM 9.0 08/10/2018 1045   CALCIUM 8.7 02/09/2017 1213   ALKPHOS 62 08/10/2018 1045   ALKPHOS 62 02/09/2017 1213   AST 19 08/10/2018 1045   AST 24 02/09/2017 1213   ALT 13 08/10/2018 1045   ALT 18 02/09/2017 1213   BILITOT 0.4 08/10/2018 1045   BILITOT 0.34 02/09/2017 1213      No results for input(s): INR in the last 168 hours.  Urinalysis    Component Value Date/Time   COLORURINE STRAW (A) 03/05/2016 1350    STUDIES:  No results found.   ASSESSMENT: 73 y.o. [place] woman with stage IV right breast cancer as of NOV 2007 (bone metastases, subcutaneous nodules)  (1) s/p Right mastectomy 1997 for an estrogen receptor and HER-2 positive breast cancer, treated with  (a) adjuvant chemotherapy according to CALGB 9640 (taxotere x 4 cycles)  (b) high-dose chemotherapy (cyclophosphamide, carboplatin, BCNU) followed by stem cell rescue at [facility] 12/22/1995  (c) biopsy-proven metastatic disease November 2007, estrogen receptor and HER-2 positive  (d) trastuzumab (dose? Duration?)  (e) tamoxifen  for 5 years completed 2002  (f) participation in a vaccine study at [facility] 2003 (CEA primed dendritic cells, HER-2 primed dendritic cells)  (2) status post right iliac crest biopsy 10/10/2013 for invasive ductal carcinoma, grade 2, estrogen and progesterone receptor positive  (3) zolendronate started 10/12/2013,  repeated every 12 weeks  (a) dexa scan 07/28/2013 normal  (b) zolendronate discontinued after January 2019 dose  (4) biopsy of a scalp lesion 10/17/2013 showed metastatic breast cancer, HER-2/neu negative  (a) biopsy of a right anterior chest wall nodule August 2017 shows adenocarcinoma  (5) anastrozole started 10/28/2013, changed to letrozole 06/27/2016  (a) measurable disease = subcutaneous nodules in scalp and LLQ abdomen  (b)  CA-27-29 is informative  (c)  Repeat PET scan 12/28/2014 shows continuing response ; exam shows resolution of scalp lesions  (d) new skin lesion removed from right anterior chest wall August 2017  (e) status post radiation to the anterior lower right chest wall completed 02/25/2017 (20 sessions)   (f) PET scan 04/04/2016 shows 2 new foci of metabolic activity (T8 and manubrium)  (g) palbociclib added 04/21/2016 at 125 mg 21/7  (h) palbociclib dose dropped to 100 mg 21/7 starting with the March 2018 cycle  (i) bone scan 09/26/2016 is stable  (j) PET scan 02/03/2017 shows her bone metastases, and in addition a 0.5 cm right middle lobe nodule which may be new  (k) MRI of the cervical spine shows collapse of the left lateral mass of C3.  There is no soft tissue mass or focal neural impingement.  (l) palbociclib reduced to 75 mg as of September 2018  (m) palbociclib held during cervical spine irradiation finished in mid February, 2019  (n) letrozole discontinued February 2019 secondary to likely  progression   (6) neoplasia associated pain  (a) painful blastic lesion left femoral intertrochanteric region: Status post radiation completed November 2015  (b)  pain left knee, status post TKR remotely  (c) left total hip replacement 03/12/2016  (7) palliative radiation to the cervical spine completed 05/08/2017 in [place]  (8) fulvestrant started 05/04/2017  (a) Palbociclib resumed 06/29/2017  (9) denosumab/Xgeva started 06/01/2017, repeated every 28 days  (10) follow-up/staging studies  (a) PET scan 08/11/2017 shows no evidence for metabolically active tumor.  Sclerotic bone mets do not show hypermetabolism  (b) PET scan 02/11/2018 shows continuing excellent tumor control  (c) PET scan 08/05/2018 shows same bone lesions but no active disease   PLAN: Jade Mooney is now just about 5 years out from definitive diagnosis of metastatic breast cancer.  Her tumor is well controlled.  She is tolerating the treatment well.  This is all very favorable.  I think we could try gabapentin for the cervical disc issue.  I am putting her on 100 mg 3 times a day to start with.  She understands that we have a wide dose range here and she could take for example 200 at bedtime if that would be helpful.  After 2 or 3 weeks she will call and let us know how she is tolerating this.  She needs restaging and I have set her up for a PET scan and left screening mammography last week in October.  She will then see me again the first week in November and we will continue her fulvestrant and Delton See here at that time assuming we document continuing disease control  She knows to call for any other issue that may develop before the next visit.   Lelar Farewell, Virgie Dad, MD  11/02/18 1:38 PM Medical Oncology and Hematology [facility] [address] [place], [place] [address] Tel. [phone]    Fax. [phone]   I, Wilburn Mylar, am acting as scribe for Dr. Virgie Dad. Arianah Torgeson.  I, Lurline Del MD, have reviewed the above documentation for accuracy and completeness, and I agree with the above.

## 2018-11-02 ENCOUNTER — Inpatient Hospital Stay (HOSPITAL_BASED_OUTPATIENT_CLINIC_OR_DEPARTMENT_OTHER): Payer: Medicare Other | Admitting: Oncology

## 2018-11-02 ENCOUNTER — Encounter: Payer: Self-pay | Admitting: Pharmacist

## 2018-11-02 ENCOUNTER — Inpatient Hospital Stay: Payer: Medicare Other

## 2018-11-02 ENCOUNTER — Inpatient Hospital Stay: Payer: Medicare Other | Attending: Oncology

## 2018-11-02 ENCOUNTER — Other Ambulatory Visit: Payer: Self-pay

## 2018-11-02 VITALS — BP 142/65 | HR 69 | Temp 98.7°F | Resp 20 | Ht 63.0 in | Wt 171.1 lb

## 2018-11-02 DIAGNOSIS — Z5111 Encounter for antineoplastic chemotherapy: Secondary | ICD-10-CM | POA: Insufficient documentation

## 2018-11-02 DIAGNOSIS — C50912 Malignant neoplasm of unspecified site of left female breast: Secondary | ICD-10-CM

## 2018-11-02 DIAGNOSIS — C7951 Secondary malignant neoplasm of bone: Secondary | ICD-10-CM

## 2018-11-02 DIAGNOSIS — C50811 Malignant neoplasm of overlapping sites of right female breast: Secondary | ICD-10-CM

## 2018-11-02 DIAGNOSIS — Z170 Estrogen receptor positive status [ER+]: Secondary | ICD-10-CM | POA: Diagnosis not present

## 2018-11-02 DIAGNOSIS — C50911 Malignant neoplasm of unspecified site of right female breast: Secondary | ICD-10-CM

## 2018-11-02 DIAGNOSIS — C50919 Malignant neoplasm of unspecified site of unspecified female breast: Secondary | ICD-10-CM

## 2018-11-02 LAB — COMPREHENSIVE METABOLIC PANEL
ALT: 16 U/L (ref 0–44)
AST: 23 U/L (ref 15–41)
Albumin: 4.6 g/dL (ref 3.5–5.0)
Alkaline Phosphatase: 58 U/L (ref 38–126)
Anion gap: 12 (ref 5–15)
BUN: 18 mg/dL (ref 8–23)
CO2: 24 mmol/L (ref 22–32)
Calcium: 9.1 mg/dL (ref 8.9–10.3)
Chloride: 102 mmol/L (ref 98–111)
Creatinine, Ser: 0.92 mg/dL (ref 0.44–1.00)
GFR calc Af Amer: >60 mL/min (ref >60)
GFR calc non Af Amer: >60 mL/min (ref >60)
Glucose, Bld: 125 mg/dL — ABNORMAL HIGH (ref 70–99)
Potassium: 3.6 mmol/L (ref 3.5–5.1)
Sodium: 138 mmol/L (ref 135–145)
Total Bilirubin: 0.6 mg/dL (ref 0.3–1.2)
Total Protein: 7.1 g/dL (ref 6.5–8.1)

## 2018-11-02 LAB — CBC WITH DIFFERENTIAL/PLATELET
Abs Immature Granulocytes: 0.01 10*3/uL (ref 0.00–0.07)
Basophils Absolute: 0 10*3/uL (ref 0.0–0.1)
Basophils Relative: 1 %
Eosinophils Absolute: 0 10*3/uL (ref 0.0–0.5)
Eosinophils Relative: 1 %
HCT: 38.2 % (ref 36.0–46.0)
Hemoglobin: 12.6 g/dL (ref 12.0–15.0)
Immature Granulocytes: 0 %
Lymphocytes Relative: 20 %
Lymphs Abs: 0.7 10*3/uL (ref 0.7–4.0)
MCH: 30.4 pg (ref 26.0–34.0)
MCHC: 33 g/dL (ref 30.0–36.0)
MCV: 92 fL (ref 80.0–100.0)
Monocytes Absolute: 0.3 10*3/uL (ref 0.1–1.0)
Monocytes Relative: 8 %
Neutro Abs: 2.3 10*3/uL (ref 1.7–7.7)
Neutrophils Relative %: 70 %
Platelets: 188 10*3/uL (ref 150–400)
RBC: 4.15 MIL/uL (ref 3.87–5.11)
RDW: 14.6 % (ref 11.5–15.5)
WBC: 3.3 10*3/uL — ABNORMAL LOW (ref 4.0–10.5)
nRBC: 0 % (ref 0.0–0.2)

## 2018-11-02 MED ORDER — DENOSUMAB 120 MG/1.7ML ~~LOC~~ SOLN
SUBCUTANEOUS | Status: AC
  Filled 2018-11-02: qty 1.7

## 2018-11-02 MED ORDER — FULVESTRANT 250 MG/5ML IM SOLN
500.0000 mg | Freq: Once | INTRAMUSCULAR | Status: AC
  Administered 2018-11-02: 500 mg via INTRAMUSCULAR

## 2018-11-02 MED ORDER — GABAPENTIN 100 MG PO CAPS: 100.0000 mg | ORAL_CAPSULE | Freq: Every day | ORAL | 4 refills | Status: DC

## 2018-11-02 MED ORDER — FULVESTRANT 250 MG/5ML IM SOLN
INTRAMUSCULAR | Status: AC
  Filled 2018-11-02: qty 10

## 2018-11-02 MED ORDER — DENOSUMAB 120 MG/1.7ML ~~LOC~~ SOLN
120.0000 mg | Freq: Once | SUBCUTANEOUS | Status: AC
  Administered 2018-11-02: 120 mg via SUBCUTANEOUS

## 2018-11-02 NOTE — Progress Notes (Signed)
Oral Chemotherapy Pharmacist Encounter  Dispensed samples to patient:  Medication: Ibrance 75 mg tablets Instructions: Take 1 tablet (75 mg) by mouth once every other day for 21 days on, 7 days off, repeated every 28 days Quantity dispensed: 21 Days supply: 56 Manufacturer: Pfizer Lot: [id number] Exp: 07/2019  Samples will be left with Dr. Virgie Dad collaborative practice RN. I have contacted patient and made her aware.  Johny Drilling, PharmD, BCPS, BCOP  11/02/2018 10:35 AM Oral Oncology Clinic [phone]

## 2018-11-02 NOTE — Patient Instructions (Signed)
Fulvestrant injection What is this medicine? FULVESTRANT (ful VES trant) blocks the effects of estrogen. It is used to treat breast cancer. This medicine may be used for other purposes; ask your health care provider or pharmacist if you have questions. COMMON BRAND NAME(S): FASLODEX What should I tell my health care provider before I take this medicine? They need to know if you have any of these conditions: -bleeding problems -liver disease -low levels of platelets in the blood -an unusual or allergic reaction to fulvestrant, other medicines, foods, dyes, or preservatives -pregnant or trying to get pregnant -breast-feeding How should I use this medicine? This medicine is for injection into a muscle. It is usually given by a health care professional in a hospital or clinic setting. Talk to your pediatrician regarding the use of this medicine in children. Special care may be needed. Overdosage: If you think you have taken too much of this medicine contact a poison control center or emergency room at once. NOTE: This medicine is only for you. Do not share this medicine with others. What if I miss a dose? It is important not to miss your dose. Call your doctor or health care professional if you are unable to keep an appointment. What may interact with this medicine? -medicines that treat or prevent blood clots like warfarin, enoxaparin, and dalteparin This list may not describe all possible interactions. Give your health care provider a list of all the medicines, herbs, non-prescription drugs, or dietary supplements you use. Also tell them if you smoke, drink alcohol, or use illegal drugs. Some items may interact with your medicine. What should I watch for while using this medicine? Your condition will be monitored carefully while you are receiving this medicine. You will need important blood work done while you are taking this medicine. Do not become pregnant while taking this medicine or for  at least 1 year after stopping it. Women of child-bearing potential will need to have a negative pregnancy test before starting this medicine. Women should inform their doctor if they wish to become pregnant or think they might be pregnant. There is a potential for serious side effects to an unborn child. Men should inform their doctors if they wish to father a child. This medicine may lower sperm counts. Talk to your health care professional or pharmacist for more information. Do not breast-feed an infant while taking this medicine or for 1 year after the last dose. What side effects may I notice from receiving this medicine? Side effects that you should report to your doctor or health care professional as soon as possible: -allergic reactions like skin rash, itching or hives, swelling of the face, lips, or tongue -feeling faint or lightheaded, falls -pain, tingling, numbness, or weakness in the legs -signs and symptoms of infection like fever or chills; cough; flu-like symptoms; sore throat -vaginal bleeding Side effects that usually do not require medical attention (report to your doctor or health care professional if they continue or are bothersome): -aches, pains -constipation -diarrhea -headache -hot flashes -nausea, vomiting -pain at site where injected -stomach pain This list may not describe all possible side effects. Call your doctor for medical advice about side effects. You may report side effects to FDA at 1-800-FDA-1088. Where should I keep my medicine? This drug is given in a hospital or clinic and will not be stored at home. NOTE: This sheet is a summary. It may not cover all possible information. If you have questions about this medicine, talk to your   doctor, pharmacist, or health care provider.  2018 Elsevier/Gold Standard (2014-10-06 11:03:55) Denosumab injection What is this medicine? DENOSUMAB (den oh sue mab) slows bone breakdown. Prolia is used to treat osteoporosis in  women after menopause and in men. Delton See is used to treat a high calcium level due to cancer and to prevent bone fractures and other bone problems caused by multiple myeloma or cancer bone metastases. Delton See is also used to treat giant cell tumor of the bone. This medicine may be used for other purposes; ask your health care provider or pharmacist if you have questions. COMMON BRAND NAME(S): Prolia, XGEVA What should I tell my health care provider before I take this medicine? They need to know if you have any of these conditions: -dental disease -having surgery or tooth extraction -infection -kidney disease -low levels of calcium or Vitamin D in the blood -malnutrition -on hemodialysis -skin conditions or sensitivity -thyroid or parathyroid disease -an unusual reaction to denosumab, other medicines, foods, dyes, or preservatives -pregnant or trying to get pregnant -breast-feeding How should I use this medicine? This medicine is for injection under the skin. It is given by a health care professional in a hospital or clinic setting. If you are getting Prolia, a special MedGuide will be given to you by the pharmacist with each prescription and refill. Be sure to read this information carefully each time. For Prolia, talk to your pediatrician regarding the use of this medicine in children. Special care may be needed. For Delton See, talk to your pediatrician regarding the use of this medicine in children. While this drug may be prescribed for children as young as 13 years for selected conditions, precautions do apply. Overdosage: If you think you have taken too much of this medicine contact a poison control center or emergency room at once. NOTE: This medicine is only for you. Do not share this medicine with others. What if I miss a dose? It is important not to miss your dose. Call your doctor or health care professional if you are unable to keep an appointment. What may interact with this  medicine? Do not take this medicine with any of the following medications: -other medicines containing denosumab This medicine may also interact with the following medications: -medicines that lower your chance of fighting infection -steroid medicines like prednisone or cortisone This list may not describe all possible interactions. Give your health care provider a list of all the medicines, herbs, non-prescription drugs, or dietary supplements you use. Also tell them if you smoke, drink alcohol, or use illegal drugs. Some items may interact with your medicine. What should I watch for while using this medicine? Visit your doctor or health care professional for regular checks on your progress. Your doctor or health care professional may order blood tests and other tests to see how you are doing. Call your doctor or health care professional for advice if you get a fever, chills or sore throat, or other symptoms of a cold or flu. Do not treat yourself. This drug may decrease your body's ability to fight infection. Try to avoid being around people who are sick. You should make sure you get enough calcium and vitamin D while you are taking this medicine, unless your doctor tells you not to. Discuss the foods you eat and the vitamins you take with your health care professional. See your dentist regularly. Brush and floss your teeth as directed. Before you have any dental work done, tell your dentist you are receiving this medicine. Do  not become pregnant while taking this medicine or for 5 months after stopping it. Talk with your doctor or health care professional about your birth control options while taking this medicine. Women should inform their doctor if they wish to become pregnant or think they might be pregnant. There is a potential for serious side effects to an unborn child. Talk to your health care professional or pharmacist for more information. What side effects may I notice from receiving this  medicine? Side effects that you should report to your doctor or health care professional as soon as possible: -allergic reactions like skin rash, itching or hives, swelling of the face, lips, or tongue -bone pain -breathing problems -dizziness -jaw pain, especially after dental work -redness, blistering, peeling of the skin -signs and symptoms of infection like fever or chills; cough; sore throat; pain or trouble passing urine -signs of low calcium like fast heartbeat, muscle cramps or muscle pain; pain, tingling, numbness in the hands or feet; seizures -unusual bleeding or bruising -unusually weak or tired Side effects that usually do not require medical attention (report to your doctor or health care professional if they continue or are bothersome): -constipation -diarrhea -headache -joint pain -loss of appetite -muscle pain -runny nose -tiredness -upset stomach This list may not describe all possible side effects. Call your doctor for medical advice about side effects. You may report side effects to FDA at 1-800-FDA-1088. Where should I keep my medicine? This medicine is only given in a clinic, doctor's office, or other health care setting and will not be stored at home. NOTE: This sheet is a summary. It may not cover all possible information. If you have questions about this medicine, talk to your doctor, pharmacist, or health care provider.  2018 Elsevier/Gold Standard (2016-04-01 19:17:21)

## 2018-11-03 ENCOUNTER — Telehealth: Payer: Self-pay | Admitting: Oncology

## 2018-11-03 LAB — CANCER ANTIGEN 27.29: CA 27.29: 40.5 U/mL — ABNORMAL HIGH (ref 0.0–38.6)

## 2018-11-03 NOTE — Telephone Encounter (Signed)
I left a message regarding schedule  

## 2018-11-18 ENCOUNTER — Telehealth: Payer: Self-pay | Admitting: *Deleted

## 2018-11-18 MED ORDER — GABAPENTIN 100 MG PO CAPS: ORAL_CAPSULE | ORAL | 4 refills | Status: DC

## 2018-11-18 NOTE — Telephone Encounter (Signed)
This RN spoke with Jade Mooney per her call stating she is having continued and actually some increase in cervical pain despite 2 week use of gabapentin 100 tid.  Jordann is inquiring " maybe I should see Dr Ellene Route again ?"  Per phone discussion- Katesha will slowly titrate gabapentin up to 300 mg tid - side effects discussed with Jade Mooney verbalizing understanding. Scheryl will call Dr Clarice Pole ( established Jade Mooney ) and schedule an appointment.  Jade Mooney agreeable and appreciative of plan as well as to call this office if further needs.

## 2018-11-22 DIAGNOSIS — I10 Essential (primary) hypertension: Secondary | ICD-10-CM | POA: Diagnosis not present

## 2018-11-22 DIAGNOSIS — E782 Mixed hyperlipidemia: Secondary | ICD-10-CM | POA: Diagnosis not present

## 2018-11-22 DIAGNOSIS — E039 Hypothyroidism, unspecified: Secondary | ICD-10-CM | POA: Diagnosis not present

## 2018-12-02 DIAGNOSIS — Z5111 Encounter for antineoplastic chemotherapy: Secondary | ICD-10-CM | POA: Diagnosis not present

## 2018-12-02 DIAGNOSIS — C7951 Secondary malignant neoplasm of bone: Secondary | ICD-10-CM | POA: Diagnosis not present

## 2018-12-02 DIAGNOSIS — C50919 Malignant neoplasm of unspecified site of unspecified female breast: Secondary | ICD-10-CM | POA: Diagnosis not present

## 2018-12-03 DIAGNOSIS — M47892 Other spondylosis, cervical region: Secondary | ICD-10-CM | POA: Diagnosis not present

## 2018-12-03 DIAGNOSIS — E538 Deficiency of other specified B group vitamins: Secondary | ICD-10-CM | POA: Diagnosis not present

## 2018-12-03 DIAGNOSIS — Z23 Encounter for immunization: Secondary | ICD-10-CM | POA: Diagnosis not present

## 2018-12-09 DIAGNOSIS — M4802 Spinal stenosis, cervical region: Secondary | ICD-10-CM | POA: Diagnosis not present

## 2018-12-09 DIAGNOSIS — M4722 Other spondylosis with radiculopathy, cervical region: Secondary | ICD-10-CM | POA: Diagnosis not present

## 2018-12-09 DIAGNOSIS — M542 Cervicalgia: Secondary | ICD-10-CM | POA: Diagnosis not present

## 2018-12-15 DIAGNOSIS — M47892 Other spondylosis, cervical region: Secondary | ICD-10-CM | POA: Diagnosis not present

## 2018-12-15 DIAGNOSIS — I10 Essential (primary) hypertension: Secondary | ICD-10-CM | POA: Diagnosis not present

## 2018-12-15 DIAGNOSIS — Z6828 Body mass index (BMI) 28.0-28.9, adult: Secondary | ICD-10-CM | POA: Diagnosis not present

## 2018-12-15 DIAGNOSIS — M4802 Spinal stenosis, cervical region: Secondary | ICD-10-CM | POA: Diagnosis not present

## 2018-12-22 DIAGNOSIS — E782 Mixed hyperlipidemia: Secondary | ICD-10-CM | POA: Diagnosis not present

## 2018-12-22 DIAGNOSIS — E039 Hypothyroidism, unspecified: Secondary | ICD-10-CM | POA: Diagnosis not present

## 2018-12-24 DIAGNOSIS — M542 Cervicalgia: Secondary | ICD-10-CM | POA: Diagnosis not present

## 2018-12-24 DIAGNOSIS — M2560 Stiffness of unspecified joint, not elsewhere classified: Secondary | ICD-10-CM | POA: Diagnosis not present

## 2018-12-29 ENCOUNTER — Telehealth: Payer: Self-pay | Admitting: Pharmacist

## 2018-12-29 DIAGNOSIS — M2560 Stiffness of unspecified joint, not elsewhere classified: Secondary | ICD-10-CM | POA: Diagnosis not present

## 2018-12-29 DIAGNOSIS — M542 Cervicalgia: Secondary | ICD-10-CM | POA: Diagnosis not present

## 2018-12-29 NOTE — Telephone Encounter (Signed)
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  Original Start date of oral chemotherapy:04/21/2016 Ibrance re-start: 06/29/2017 on 1 capsule (75 mg) by mouth once daily with whole food, take for 21 days on, 7 days off, repeat every 28 days  Dose changed to1 capsule (75 mg) by mouth onceevery other daywith whole food, take for 21 days on, 7 days off, repeat every 28 days on 08/03/2017  Leslee Home has now changed to a tablet formulation.  Pt reports0tablets/doses of Ibrance '75mg'$  tablets,1 tablet (75 mg) by mouth onceevery other day, taken for 21 days on, 7 days off, repeat every 28 days,missed in the last month  Pt reports the following side effects:mild, manageable fatigue, unchanged  Pertinent labs reviewed:OK for continued treatment. 11/02/18 CA 27.29 is stable Next PET scan planned for 01/18/19  Dispensed samples to patient:  Medication:Ibrance '75mg'$  tablets Instructions:Take1 tablet (75 mg) by mouth onceevery other day, take for 21 days on, 7 days off, repeat every 28 days Quantity dispensed:42 Days supply:110 Manufacturer:[employer] WOE:[id number] Exp:07/2019  Patient will pick up Ibrance samples tomorrow, 12/30/2018.  Patient knows to call the office with questions or concerns.  Johny Drilling, PharmD, BCPS, BCOP 12/29/2018  10:59 AM   [facility] [phone]

## 2018-12-30 DIAGNOSIS — C7951 Secondary malignant neoplasm of bone: Secondary | ICD-10-CM | POA: Diagnosis not present

## 2018-12-30 DIAGNOSIS — Z5111 Encounter for antineoplastic chemotherapy: Secondary | ICD-10-CM | POA: Diagnosis not present

## 2018-12-30 DIAGNOSIS — C50919 Malignant neoplasm of unspecified site of unspecified female breast: Secondary | ICD-10-CM | POA: Diagnosis not present

## 2019-01-03 DIAGNOSIS — E538 Deficiency of other specified B group vitamins: Secondary | ICD-10-CM | POA: Diagnosis not present

## 2019-01-07 DIAGNOSIS — M2560 Stiffness of unspecified joint, not elsewhere classified: Secondary | ICD-10-CM | POA: Diagnosis not present

## 2019-01-07 DIAGNOSIS — M542 Cervicalgia: Secondary | ICD-10-CM | POA: Diagnosis not present

## 2019-01-11 DIAGNOSIS — C50919 Malignant neoplasm of unspecified site of unspecified female breast: Secondary | ICD-10-CM | POA: Diagnosis not present

## 2019-01-14 DIAGNOSIS — M2560 Stiffness of unspecified joint, not elsewhere classified: Secondary | ICD-10-CM | POA: Diagnosis not present

## 2019-01-14 DIAGNOSIS — M542 Cervicalgia: Secondary | ICD-10-CM | POA: Diagnosis not present

## 2019-01-18 ENCOUNTER — Ambulatory Visit (HOSPITAL_COMMUNITY)
Admission: RE | Admit: 2019-01-18 | Discharge: 2019-01-18 | Disposition: A | Discharge status: Home / Self Care | Payer: Medicare Other | Source: Ambulatory Visit | Attending: Oncology | Admitting: Oncology

## 2019-01-18 ENCOUNTER — Other Ambulatory Visit: Payer: Self-pay

## 2019-01-18 DIAGNOSIS — C7951 Secondary malignant neoplasm of bone: Secondary | ICD-10-CM | POA: Insufficient documentation

## 2019-01-18 DIAGNOSIS — I2510 Atherosclerotic heart disease of native coronary artery without angina pectoris: Secondary | ICD-10-CM | POA: Diagnosis not present

## 2019-01-18 DIAGNOSIS — C50919 Malignant neoplasm of unspecified site of unspecified female breast: Secondary | ICD-10-CM | POA: Diagnosis not present

## 2019-01-18 DIAGNOSIS — Z170 Estrogen receptor positive status [ER+]: Secondary | ICD-10-CM | POA: Diagnosis not present

## 2019-01-18 DIAGNOSIS — C50811 Malignant neoplasm of overlapping sites of right female breast: Secondary | ICD-10-CM | POA: Diagnosis not present

## 2019-01-18 DIAGNOSIS — K8020 Calculus of gallbladder without cholecystitis without obstruction: Secondary | ICD-10-CM | POA: Diagnosis not present

## 2019-01-18 DIAGNOSIS — K5730 Diverticulosis of large intestine without perforation or abscess without bleeding: Secondary | ICD-10-CM | POA: Insufficient documentation

## 2019-01-18 DIAGNOSIS — I700 Atherosclerosis of aorta: Secondary | ICD-10-CM | POA: Insufficient documentation

## 2019-01-18 LAB — GLUCOSE, CAPILLARY: Glucose-Capillary: 123 mg/dL — ABNORMAL HIGH (ref 70–99)

## 2019-01-18 MED ORDER — FLUDEOXYGLUCOSE F - 18 (FDG) INJECTION
8.4900 | Freq: Once | INTRAVENOUS | Status: AC
  Administered 2019-01-18: 8.49 via INTRAVENOUS

## 2019-01-21 DIAGNOSIS — M542 Cervicalgia: Secondary | ICD-10-CM | POA: Diagnosis not present

## 2019-01-21 DIAGNOSIS — M2560 Stiffness of unspecified joint, not elsewhere classified: Secondary | ICD-10-CM | POA: Diagnosis not present

## 2019-01-22 DIAGNOSIS — I10 Essential (primary) hypertension: Secondary | ICD-10-CM | POA: Diagnosis not present

## 2019-01-22 DIAGNOSIS — E782 Mixed hyperlipidemia: Secondary | ICD-10-CM | POA: Diagnosis not present

## 2019-01-22 DIAGNOSIS — K219 Gastro-esophageal reflux disease without esophagitis: Secondary | ICD-10-CM | POA: Diagnosis not present

## 2019-01-24 NOTE — Progress Notes (Signed)
[place]  [phone] [phone]) [phone]   ID: Jade Mooney DOB: 07-07-1945  MR#: [id number]  DPO#:[id number]  Patient Care Team: Jade Mooney, Jade Mt, MD as PCP - General (Family Medicine) Jade Essex, MD as Consulting Physician (Gastroenterology) Burnis Halling, Jade Dad, MD as Consulting Physician (Oncology) Gaynelle Arabian, MD as Consulting Physician (Orthopedic Surgery) Gatha Mayer, MD as Consulting Physician (Radiation Oncology) Kristeen Miss, MD as Consulting Physician (Neurosurgery) Derwood Kaplan, MD as Consulting Physician (Oncology)   CHIEF COMPLAINT: metastatic breast cancer (s/p right mastectomy)  CURRENT TREATMENT: fulvestrant, denosumab/Xgeva, palbociclib   INTERVAL HISTORY: Jade Mooney returns today for follow-up and treatment of her estrogen receptor positive breast cancer.   She continues on palbociclib/Ibrance at 75 mg every other day, 21 days on 7 days off. She denies fatigue and pain issues. She notes some rare nausea.   She also continues on fulvestrant, with her most recent dose on 11/02/2018.  She tolerates that with no side effects that she is aware of, aside from soreness to the injection site for a few days.  Finally, she also continues on denosumab/Xgeva, with her most recent dose on 11/02/2018.  She also tolerates this well and specifically has had no dental issues. She notes some mild bony aches the first couple of days following the shot.  Since her last visit, she underwent restaging PET scan on 01/18/2019, which showed: presumably treated metastatic disease to the bone, with no new extraskeletal metastatic disease identified; patchy areas of septal thickening in the right lung base, new since prior, presumably infectious or inflammatory.  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  She has a constant discomfort, not a pain she says, involving the left shoulder, left upper arm, and left anterior upper chest.  She has not done well with gabapentin for this and physical therapy has not resolved the issue.  She is strongly considering surgery under Dr. Leisa Mooney in the near future.  Aside from that she walks about 5000 steps per day.  She has a feeling of fullness in the left eye and she has an ophthalmologic exam pending.  A detailed review of systems today was otherwise stable   BREAST CANCER HISTORY: From the earlier summary notes:  Jade Mooney was referred to Dr. Clarene Mooney for evaluation of abdominal discomfort and nausea. Exam and blood work including liver function tests was unremarkable aside from normochromic normocytic anemia. Cholecystitis was suspected and the patient underwent endoscopy followed by abdominal/ pelvic CT scan on 09/29/2013. This showed showed a normal gallbladder. Incidental findings included multiple simple cysts in the liver and aortic atherosclerosis. However mixed lytic and sclerotic bony lesions were noted. On 10/10/2013 the patient underwent biopsy of the right iliac bone, and this showed ([id number]) metastatic invasive ductal carcinoma, grade 2, estrogen and progesterone receptor strongly positive.  Jade Mooney has a remote history of breast cancer, which we tried to reconstruct. She was originally diagnosed early in 1997 with stage III disease, and underwent right mastectomy followed by adjuvant chemotherapy. She then participated in a [facility] protocol with high-dose chemotherapy followed by stem cell rescue 12/22/1995. Unfortunately later that year liver lesions were noted and were biopsy-proven to be metastatic breast cancer. This  was not felt to be resectable. However the tumor was estrogen receptor positive and HER-2  positive. The patient was treated with Herceptin (she does not know for what period of time) and was then on tamoxifen until November of 2002. She also participated in a vaccine study at [facility].  Jade Mooney's subsequent history is as detailed below   PAST MEDICAL HISTORY: Past Medical History:  Diagnosis Date   Anxiety    Cancer [facility])    Breast 1997 right tx with mastectomy and chemo, metastatic now   GERD (gastroesophageal reflux disease)    Hypercholesterolemia    Hypertension    Hypothyroidism    Osteoarthritis    oa   Personal history of chemotherapy    Personal history of radiation therapy    TIA (transient ischemic attack) last 11-08-15   x 2 total    PAST SURGICAL HISTORY: Past Surgical History:  Procedure Laterality Date   CATARACT EXTRACTION, BILATERAL     MASTECTOMY MODIFIED RADICAL Right 1997   porta cath insertion  1997   later removed   radiation tx  finished 02-25-16   x 20 tx   TONSILLECTOMY     TOTAL HIP ARTHROPLASTY Left 03/12/2016   Procedure: LEFT TOTAL HIP ARTHROPLASTY ANTERIOR APPROACH;  Surgeon: Gaynelle Arabian, MD;  Location: [facility];  Service: Orthopedics;  Laterality: Left;   TOTAL KNEE ARTHROPLASTY Left    TUBAL LIGATION      FAMILY HISTORY: Family History  Problem Relation Age of Onset   Breast cancer Maternal Aunt    The patient's father died at the age of 64 from heart disease. The patient's mother died at the age of 36. The patient had no brothers, one sister. One of the patient's mother's 5 sisters was diagnosed with breast cancer in her 38s   GYNECOLOGIC HISTORY:  No LMP recorded. Patient is postmenopausal. Menarche age 22, the patient is GX P0. She stopped having periods approximately 1994. She used hormone replacement until 1997. 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 Her husband Jade Mooney (goes by [name]")  was [employer]. of information all services at [employer]. They live alone, with no pets.   ADVANCED DIRECTIVES: In place   HEALTH MAINTENANCE: Social History   Tobacco Use   Smoking status: Never Smoker   Smokeless tobacco: Never Used  Substance Use Topics   Alcohol use: Yes    Comment: 1 glass wine 4-5 x week   Drug use: No    Colonoscopy: 2013  PAP: 2010  Bone density: 2012  Lipid panel:  Allergies  Allergen Reactions   Amoxicillin Hives    Has patient had a PCN reaction causing immediate rash, facial/tongue/throat swelling, SOB or lightheadedness with hypotension:unsure Has patient had a PCN reaction causing severe rash involving mucus membranes or skin necrosis:No Has patient had a PCN reaction that required hospitalization:No Has patient had a PCN reaction occurring within the last 10 years: Yes If all of the above answers are "NO", then may proceed with Cephalosporin use.    Percocet [Oxycodone-Acetaminophen] Nausea And Vomiting   Percodan [Oxycodone-Aspirin] Nausea And Vomiting   Tramadol Itching    Current Outpatient Medications  Medication Sig Dispense Refill   acetaminophen (TYLENOL) 500 MG tablet Take 1,000 mg by mouth every 6 (six) hours as needed for mild pain. Reported on 04/17/2015     calcium carbonate (OSCAL) 1500 (600 Ca) MG TABS  tablet Take 600 mg of elemental calcium by mouth 2 (two) times daily with a meal.     cholecalciferol (VITAMIN D) 1000 units tablet Take 1,000 Units by mouth daily.     clopidogrel (PLAVIX) 75 MG tablet Take 1 tablet (75 mg total) by mouth daily.     cyanocobalamin (,VITAMIN B-12,) 1000 MCG/ML injection Inject 1,000 mcg into the muscle once.     levothyroxine (SYNTHROID, LEVOTHROID) 88 MCG tablet Take 88 mcg by mouth daily before breakfast.     Lutein 6 MG TABS Take 6 mg by mouth 2 (two) times daily.      magnesium gluconate (MAGONATE) 500 MG tablet Take 500 mg by mouth at bedtime.     meclizine (ANTIVERT) 25 MG tablet Take  25 mg by mouth 3 (three) times daily as needed for dizziness.     METRONIDAZOLE, TOPICAL, 0.75 % LOTN Apply 1 application topically at bedtime. Apply to face for rosacea     palbociclib (IBRANCE) 75 MG capsule Take 1 capsule (75 mg total) by mouth daily with breakfast. Take whole with food on days 1-21, every 28 days. 21 capsule 2   pantoprazole (PROTONIX) 40 MG tablet Take 40 mg by mouth 2 (two) times daily. Before breakfast and before supper     Polyethyl Glycol-Propyl Glycol (SYSTANE ULTRA) 0.4-0.3 % SOLN Place 1-2 drops into both eyes 2 (two) times daily.     polyethylene glycol (MIRALAX / GLYCOLAX) packet Take 4 g by mouth daily. 1 teaspoonful in the morning     rOPINIRole (REQUIP) 0.5 MG tablet Take 0.5 mg by mouth 3 (three) times daily.     rosuvastatin (CRESTOR) 20 MG tablet Take 1 tablet (20 mg total) by mouth daily.     traZODone (DESYREL) 50 MG tablet Take 25 mg by mouth at bedtime.      No current facility-administered medications for this visit.     OBJECTIVE: Older white woman in no acute distress  Vitals:   01/25/19 1100  BP: (!) 159/77  Pulse: 67  Resp: 18  Temp: 98.2 F (36.8 C)  SpO2: 100%     Body mass index is 29.58 kg/m.    ECOG FS:1 - Symptomatic but completely ambulatory  Sclerae unicteric, EOMs intact Wearing a mask No cervical or supraclavicular adenopathy Lungs no rales or rhonchi Heart regular rate and rhythm Abd soft, nontender, positive bowel sounds MSK no focal spinal tenderness, no upper extremity lymphedema Neuro: nonfocal, well oriented, appropriate affect Breasts: The right breast is status post mastectomy.  There is an area of telangiectasias where she received radiation focally.  Left breast is benign.  Both axillae are benign.   LAB RESULTS: CMP     Component Value Date/Time   NA 137 01/25/2019 1048   NA 138 02/09/2017 1213   K 4.0 01/25/2019 1048   K 3.9 02/09/2017 1213   CL 102 01/25/2019 1048   CO2 25 01/25/2019 1048   CO2  27 02/09/2017 1213   GLUCOSE 94 01/25/2019 1048   GLUCOSE 100 02/09/2017 1213   BUN 18 01/25/2019 1048   BUN 12.0 02/09/2017 1213   CREATININE 0.88 01/25/2019 1048   CREATININE 0.9 02/09/2017 1213   CALCIUM 9.2 01/25/2019 1048   CALCIUM 8.7 02/09/2017 1213   PROT 7.0 01/25/2019 1048   PROT 6.5 02/09/2017 1213   ALBUMIN 4.5 01/25/2019 1048   ALBUMIN 4.1 02/09/2017 1213   AST 20 01/25/2019 1048   AST 24 02/09/2017 1213   ALT 13 01/25/2019 1048  ALT 18 02/09/2017 1213   ALKPHOS 54 01/25/2019 1048   ALKPHOS 62 02/09/2017 1213   BILITOT 0.4 01/25/2019 1048   BILITOT 0.34 02/09/2017 1213   GFRNONAA >60 01/25/2019 1048   GFRAA >60 01/25/2019 1048   No results found for: SPEP  Lab Results  Component Value Date   WBC 3.2 (L) 01/25/2019   NEUTROABS 2.2 01/25/2019   HGB 12.9 01/25/2019   HCT 39.5 01/25/2019   MCV 93.8 01/25/2019   PLT 242 01/25/2019      Chemistry      Component Value Date/Time   NA 137 01/25/2019 1048   NA 138 02/09/2017 1213   K 4.0 01/25/2019 1048   K 3.9 02/09/2017 1213   CL 102 01/25/2019 1048   CO2 25 01/25/2019 1048   CO2 27 02/09/2017 1213   BUN 18 01/25/2019 1048   BUN 12.0 02/09/2017 1213   CREATININE 0.88 01/25/2019 1048   CREATININE 0.9 02/09/2017 1213      Component Value Date/Time   CALCIUM 9.2 01/25/2019 1048   CALCIUM 8.7 02/09/2017 1213   ALKPHOS 54 01/25/2019 1048   ALKPHOS 62 02/09/2017 1213   AST 20 01/25/2019 1048   AST 24 02/09/2017 1213   ALT 13 01/25/2019 1048   ALT 18 02/09/2017 1213   BILITOT 0.4 01/25/2019 1048   BILITOT 0.34 02/09/2017 1213      No results for input(s): INR in the last 168 hours.  Urinalysis    Component Value Date/Time   COLORURINE STRAW (A) 03/05/2016 1350    STUDIES:  Nm Pet Image Restag (ps) Skull Base To Thigh  Result Date: 01/18/2019 CLINICAL DATA:  Subsequent treatment strategy for breast cancer. EXAM: NUCLEAR MEDICINE PET SKULL BASE TO THIGH TECHNIQUE: 8.49 mCi F-18 FDG was  injected intravenously. Full-ring PET imaging was performed from the skull base to thigh after the radiotracer. CT data was obtained and used for attenuation correction and anatomic localization. Fasting blood glucose: 123 mg/dl COMPARISON:  PET-CT 08/04/2018. FINDINGS: Mediastinal blood pool activity: SUV max 3.3 Liver activity: SUV max NA NECK: No hypermetabolic lymph nodes in the neck. Incidental CT findings: none CHEST: No hypermetabolic mediastinal or hilar nodes. No suspicious pulmonary nodules on the CT scan. Incidental CT findings: Status post right modified radical mastectomy. Small 5 mm partially calcified subcutaneous nodule in the medial aspect of the left breast, stable compared to the prior study, with metabolic activity below that of blood pool, similar to the prior examination, presumably benign. Atherosclerotic calcifications in the thoracic aorta as well as the left anterior descending and right coronary arteries. Calcifications of the mitral annulus. Patchy areas of septal thickening and mild architectural distortion in the right lung base, which may be infectious or inflammatory in etiology. ABDOMEN/PELVIS: No abnormal hypermetabolic activity within the liver, pancreas, adrenal glands, or spleen. No hypermetabolic lymph nodes in the abdomen or pelvis. Incidental CT findings: Multiple low-attenuation lesions in the liver, incompletely characterized on today's non-contrast CT examination, but demonstrating no definite hypermetabolism, similar to the prior examination, favored to represent small cysts. Noncalcified gallstones lying dependently in the gallbladder. No findings to suggest an associated acute cholecystitis at this time. Aortic atherosclerosis. Colonic diverticulosis without surrounding inflammatory changes to suggest an acute diverticulitis at this time. SKELETON: No focal hypermetabolic activity to suggest skeletal metastasis. Incidental CT findings: Diffuse sclerotic lesions  throughout the visualized axial and appendicular skeleton without definite hypermetabolism, compatible with widespread treated metastasis. Status post left hip arthroplasty. IMPRESSION: 1. Widespread metastatic disease to  the bones redemonstrated, without current hypermetabolic lesion. These are presumably all treated metastasis. No new extraskeletal metastatic disease identified on today's examination. 2. Patchy areas of septal thickening in the right lung base, new compared to the prior study, presumably of infectious or inflammatory etiology. 3. Aortic atherosclerosis, in addition to two vessel coronary artery disease. Assessment for potential risk factor modification, dietary therapy or pharmacologic therapy may be warranted, if clinically indicated. 4. Colonic diverticulosis without evidence of acute diverticulitis at this time. 5. Cholelithiasis. 6. Additional incidental findings, similar to prior studies, as above. Electronically Signed   By: Vinnie Langton M.D.   On: 01/18/2019 13:48     ASSESSMENT: 73 y.o. [place] woman with stage IV right breast cancer as of NOV 2007 (bone metastases, subcutaneous nodules)  (1) s/p Right mastectomy 1997 for an estrogen receptor and HER-2 positive breast cancer, treated with  (a) adjuvant chemotherapy according to CALGB 9640 (taxotere x 4 cycles)  (b) high-dose chemotherapy (cyclophosphamide, carboplatin, BCNU) followed by stem cell rescue at [facility] 12/22/1995  (c) biopsy-proven metastatic disease November 2007, estrogen receptor and HER-2 positive  (d) trastuzumab (dose? Duration?)  (e) tamoxifen for 5 years completed 2002  (f) participation in a vaccine study at [facility] 2003 (CEA primed dendritic cells, HER-2 primed dendritic cells)  (2) status post right iliac crest biopsy 10/10/2013 for invasive ductal carcinoma, grade 2, estrogen and progesterone receptor positive  (3) zolendronate started 10/12/2013,  repeated every 12 weeks  (a) dexa scan 07/28/2013  normal  (b) zolendronate discontinued after January 2019 dose  (4) biopsy of a scalp lesion 10/17/2013 showed metastatic breast cancer, HER-2/neu negative  (a) biopsy of a right anterior chest wall nodule August 2017 shows adenocarcinoma  (5) anastrozole started 10/28/2013, changed to letrozole 06/27/2016  (a) measurable disease = subcutaneous nodules in scalp and LLQ abdomen  (b)  CA-27-29 is informative  (c)  Repeat PET scan 12/28/2014 shows continuing response ; exam shows resolution of scalp lesions  (d) new skin lesion removed from right anterior chest wall August 2017  (e) status post radiation to the anterior lower right chest wall completed 02/25/2017 (20 sessions)   (f) PET scan 04/04/2016 shows 2 new foci of metabolic activity (T8 and manubrium)  (g) palbociclib added 04/21/2016 at 125 mg 21/7  (h) palbociclib dose dropped to 100 mg 21/7 starting with the March 2018 cycle  (i) bone scan 09/26/2016 is stable  (j) PET scan 02/03/2017 shows her bone metastases, and in addition a 0.5 cm right middle lobe nodule which may be new  (k) MRI of the cervical spine shows collapse of the left lateral mass of C3.  There is no soft tissue mass or focal neural impingement.  (l) palbociclib reduced to 75 mg as of September 2018  (m) palbociclib held during cervical spine irradiation finished in mid February, 2019  (n) letrozole discontinued February 2019 secondary to likely progression   (6) neoplasia associated pain  (a) painful blastic lesion left femoral intertrochanteric region: Status post radiation completed November 2015  (b) pain left knee, status post TKR remotely  (c) left total hip replacement 03/12/2016  (7) palliative radiation to the cervical spine completed 05/08/2017 in [place]  (8) fulvestrant started 05/04/2017  (a) Palbociclib resumed 06/29/2017  (9) denosumab/Xgeva started 06/01/2017, repeated every 28 days  (10) follow-up/staging studies  (a) PET scan 08/11/2017 shows  no evidence for metabolically active tumor.  Sclerotic bone mets do not show hypermetabolism  (b) PET scan 02/11/2018 shows continuing excellent tumor control  (  c) PET scan 08/05/2018 shows same bone lesions but no active disease  (d) PET scan 01/18/2019 finds stable disease   PLAN: I spent approximately 30 minutes face to face with Jade Mooney with more than 50% of that time spent in counseling and coordination of care.  Jade Mooney is now 13 years out from definitive diagnosis of metastatic breast cancer, with very well-controlled disease.  This is very favorable.  Her PET scan essentially shows no active disease.  The area noted in the right lung is likely secondary to radiation change.  It is of course not metabolically active.  I see no reason why she should not undergo surgery to her neck which I think will provide her with significant relief.  I have asked her to stop the palbociclib 14 days before surgery.    Otherwise we are continuing treatment as before.  She knows to call for any other issue that may develop before her next visit here, which will be in 3 months.   Jade Mooney, Jade Dad, MD  01/25/19 6:02 PM Medical Oncology and Hematology [facility] [address] [place],  [address] Tel. [phone]    Fax. [phone]   I, Wilburn Mylar, am acting as scribe for Dr. Virgie Mooney. Jade Mooney.  I, Lurline Del MD, have reviewed the above documentation for accuracy and completeness, and I agree with the above.

## 2019-01-25 ENCOUNTER — Inpatient Hospital Stay (HOSPITAL_BASED_OUTPATIENT_CLINIC_OR_DEPARTMENT_OTHER): Payer: Medicare Other | Admitting: Oncology

## 2019-01-25 ENCOUNTER — Other Ambulatory Visit: Payer: Self-pay

## 2019-01-25 ENCOUNTER — Inpatient Hospital Stay: Payer: Medicare Other

## 2019-01-25 ENCOUNTER — Inpatient Hospital Stay: Payer: Medicare Other | Attending: Oncology

## 2019-01-25 VITALS — BP 159/77 | HR 67 | Temp 98.2°F | Resp 18 | Ht 63.0 in | Wt 167.0 lb

## 2019-01-25 DIAGNOSIS — C50919 Malignant neoplasm of unspecified site of unspecified female breast: Secondary | ICD-10-CM

## 2019-01-25 DIAGNOSIS — C50811 Malignant neoplasm of overlapping sites of right female breast: Secondary | ICD-10-CM | POA: Insufficient documentation

## 2019-01-25 DIAGNOSIS — Z5111 Encounter for antineoplastic chemotherapy: Secondary | ICD-10-CM | POA: Diagnosis not present

## 2019-01-25 DIAGNOSIS — C50912 Malignant neoplasm of unspecified site of left female breast: Secondary | ICD-10-CM

## 2019-01-25 DIAGNOSIS — C7951 Secondary malignant neoplasm of bone: Secondary | ICD-10-CM | POA: Insufficient documentation

## 2019-01-25 DIAGNOSIS — Z170 Estrogen receptor positive status [ER+]: Secondary | ICD-10-CM | POA: Diagnosis not present

## 2019-01-25 DIAGNOSIS — C50911 Malignant neoplasm of unspecified site of right female breast: Secondary | ICD-10-CM

## 2019-01-25 LAB — CBC WITH DIFFERENTIAL/PLATELET
Abs Immature Granulocytes: 0.03 10*3/uL (ref 0.00–0.07)
Basophils Absolute: 0.1 10*3/uL (ref 0.0–0.1)
Basophils Relative: 2 %
Eosinophils Absolute: 0.1 10*3/uL (ref 0.0–0.5)
Eosinophils Relative: 2 %
HCT: 39.5 % (ref 36.0–46.0)
Hemoglobin: 12.9 g/dL (ref 12.0–15.0)
Immature Granulocytes: 1 %
Lymphocytes Relative: 18 %
Lymphs Abs: 0.6 10*3/uL — ABNORMAL LOW (ref 0.7–4.0)
MCH: 30.6 pg (ref 26.0–34.0)
MCHC: 32.7 g/dL (ref 30.0–36.0)
MCV: 93.8 fL (ref 80.0–100.0)
Monocytes Absolute: 0.3 10*3/uL (ref 0.1–1.0)
Monocytes Relative: 9 %
Neutro Abs: 2.2 10*3/uL (ref 1.7–7.7)
Neutrophils Relative %: 68 %
Platelets: 242 10*3/uL (ref 150–400)
RBC: 4.21 MIL/uL (ref 3.87–5.11)
RDW: 14.5 % (ref 11.5–15.5)
WBC: 3.2 10*3/uL — ABNORMAL LOW (ref 4.0–10.5)
nRBC: 0 % (ref 0.0–0.2)

## 2019-01-25 LAB — COMPREHENSIVE METABOLIC PANEL
ALT: 13 U/L (ref 0–44)
AST: 20 U/L (ref 15–41)
Albumin: 4.5 g/dL (ref 3.5–5.0)
Alkaline Phosphatase: 54 U/L (ref 38–126)
Anion gap: 10 (ref 5–15)
BUN: 18 mg/dL (ref 8–23)
CO2: 25 mmol/L (ref 22–32)
Calcium: 9.2 mg/dL (ref 8.9–10.3)
Chloride: 102 mmol/L (ref 98–111)
Creatinine, Ser: 0.88 mg/dL (ref 0.44–1.00)
GFR calc Af Amer: >60 mL/min (ref >60)
GFR calc non Af Amer: >60 mL/min (ref >60)
Glucose, Bld: 94 mg/dL (ref 70–99)
Potassium: 4 mmol/L (ref 3.5–5.1)
Sodium: 137 mmol/L (ref 135–145)
Total Bilirubin: 0.4 mg/dL (ref 0.3–1.2)
Total Protein: 7 g/dL (ref 6.5–8.1)

## 2019-01-25 MED ORDER — FULVESTRANT 250 MG/5ML IM SOLN
500.0000 mg | Freq: Once | INTRAMUSCULAR | Status: AC
  Administered 2019-01-25: 500 mg via INTRAMUSCULAR

## 2019-01-25 MED ORDER — PALBOCICLIB 75 MG PO CAPS: 75.0000 mg | ORAL_CAPSULE | Freq: Every day | ORAL | 2 refills | Status: DC

## 2019-01-25 MED ORDER — DENOSUMAB 120 MG/1.7ML ~~LOC~~ SOLN
120.0000 mg | Freq: Once | SUBCUTANEOUS | Status: AC
  Administered 2019-01-25: 13:00:00 120 mg via SUBCUTANEOUS

## 2019-01-25 MED ORDER — DENOSUMAB 120 MG/1.7ML ~~LOC~~ SOLN
SUBCUTANEOUS | Status: AC
  Filled 2019-01-25: qty 1.7

## 2019-01-25 MED ORDER — FULVESTRANT 250 MG/5ML IM SOLN
INTRAMUSCULAR | Status: AC
  Filled 2019-01-25: qty 5

## 2019-01-25 NOTE — Patient Instructions (Signed)
Denosumab injection What is this medicine? DENOSUMAB (den oh sue mab) slows bone breakdown. Prolia is used to treat osteoporosis in women after menopause and in men, and in people who are taking corticosteroids for 6 months or more. Delton See is used to treat a high calcium level due to cancer and to prevent bone fractures and other bone problems caused by multiple myeloma or cancer bone metastases. Delton See is also used to treat giant cell tumor of the bone. This medicine may be used for other purposes; ask your health care provider or pharmacist if you have questions. COMMON BRAND NAME(S): Prolia, XGEVA What should I tell my health care provider before I take this medicine? They need to know if you have any of these conditions:  dental disease  having surgery or tooth extraction  infection  kidney disease  low levels of calcium or Vitamin D in the blood  malnutrition  on hemodialysis  skin conditions or sensitivity  thyroid or parathyroid disease  an unusual reaction to denosumab, other medicines, foods, dyes, or preservatives  pregnant or trying to get pregnant  breast-feeding How should I use this medicine? This medicine is for injection under the skin. It is given by a health care professional in a hospital or clinic setting. A special MedGuide will be given to you before each treatment. Be sure to read this information carefully each time. For Prolia, talk to your pediatrician regarding the use of this medicine in children. Special care may be needed. For Delton See, talk to your pediatrician regarding the use of this medicine in children. While this drug may be prescribed for children as young as 13 years for selected conditions, precautions do apply. Overdosage: If you think you have taken too much of this medicine contact a poison control center or emergency room at once. NOTE: This medicine is only for you. Do not share this medicine with others. What if I miss a dose? It is  important not to miss your dose. Call your doctor or health care professional if you are unable to keep an appointment. What may interact with this medicine? Do not take this medicine with any of the following medications:  other medicines containing denosumab This medicine may also interact with the following medications:  medicines that lower your chance of fighting infection  steroid medicines like prednisone or cortisone This list may not describe all possible interactions. Give your health care provider a list of all the medicines, herbs, non-prescription drugs, or dietary supplements you use. Also tell them if you smoke, drink alcohol, or use illegal drugs. Some items may interact with your medicine. What should I watch for while using this medicine? Visit your doctor or health care professional for regular checks on your progress. Your doctor or health care professional may order blood tests and other tests to see how you are doing. Call your doctor or health care professional for advice if you get a fever, chills or sore throat, or other symptoms of a cold or flu. Do not treat yourself. This drug may decrease your body's ability to fight infection. Try to avoid being around people who are sick. You should make sure you get enough calcium and vitamin D while you are taking this medicine, unless your doctor tells you not to. Discuss the foods you eat and the vitamins you take with your health care professional. See your dentist regularly. Brush and floss your teeth as directed. Before you have any dental work done, tell your dentist you are  receiving this medicine. Do not become pregnant while taking this medicine or for 5 months after stopping it. Talk with your doctor or health care professional about your birth control options while taking this medicine. Women should inform their doctor if they wish to become pregnant or think they might be pregnant. There is a potential for serious side  effects to an unborn child. Talk to your health care professional or pharmacist for more information. What side effects may I notice from receiving this medicine? Side effects that you should report to your doctor or health care professional as soon as possible:  allergic reactions like skin rash, itching or hives, swelling of the face, lips, or tongue  bone pain  breathing problems  dizziness  jaw pain, especially after dental work  redness, blistering, peeling of the skin  signs and symptoms of infection like fever or chills; cough; sore throat; pain or trouble passing urine  signs of low calcium like fast heartbeat, muscle cramps or muscle pain; pain, tingling, numbness in the hands or feet; seizures  unusual bleeding or bruising  unusually weak or tired Side effects that usually do not require medical attention (report to your doctor or health care professional if they continue or are bothersome):  constipation  diarrhea  headache  joint pain  loss of appetite  muscle pain  runny nose  tiredness  upset stomach This list may not describe all possible side effects. Call your doctor for medical advice about side effects. You may report side effects to FDA at 1-800-FDA-1088. Where should I keep my medicine? This medicine is only given in a clinic, doctor's office, or other health care setting and will not be stored at home. NOTE: This sheet is a summary. It may not cover all possible information. If you have questions about this medicine, talk to your doctor, pharmacist, or health care provider.  2020 [employer]/Gold Standard (2017-07-17 16:10:44) Fulvestrant injection What is this medicine? FULVESTRANT (ful VES trant) blocks the effects of estrogen. It is used to treat breast cancer. This medicine may be used for other purposes; ask your health care provider or pharmacist if you have questions. COMMON BRAND NAME(S): FASLODEX What should I tell my health care  provider before I take this medicine? They need to know if you have any of these conditions:  bleeding disorders  liver disease  low blood counts, like low white cell, platelet, or red cell counts  an unusual or allergic reaction to fulvestrant, other medicines, foods, dyes, or preservatives  pregnant or trying to get pregnant  breast-feeding How should I use this medicine? This medicine is for injection into a muscle. It is usually given by a health care professional in a hospital or clinic setting. Talk to your pediatrician regarding the use of this medicine in children. Special care may be needed. Overdosage: If you think you have taken too much of this medicine contact a poison control center or emergency room at once. NOTE: This medicine is only for you. Do not share this medicine with others. What if I miss a dose? It is important not to miss your dose. Call your doctor or health care professional if you are unable to keep an appointment. What may interact with this medicine?  medicines that treat or prevent blood clots like warfarin, enoxaparin, dalteparin, apixaban, dabigatran, and rivaroxaban This list may not describe all possible interactions. Give your health care provider a list of all the medicines, herbs, non-prescription drugs, or dietary supplements you use.   Also tell them if you smoke, drink alcohol, or use illegal drugs. Some items may interact with your medicine. What should I watch for while using this medicine? Your condition will be monitored carefully while you are receiving this medicine. You will need important blood work done while you are taking this medicine. Do not become pregnant while taking this medicine or for at least 1 year after stopping it. Women of child-bearing potential will need to have a negative pregnancy test before starting this medicine. Women should inform their doctor if they wish to become pregnant or think they might be pregnant. There is  a potential for serious side effects to an unborn child. Men should inform their doctors if they wish to father a child. This medicine may lower sperm counts. Talk to your health care professional or pharmacist for more information. Do not breast-feed an infant while taking this medicine or for 1 year after the last dose. What side effects may I notice from receiving this medicine? Side effects that you should report to your doctor or health care professional as soon as possible:  allergic reactions like skin rash, itching or hives, swelling of the face, lips, or tongue  feeling faint or lightheaded, falls  pain, tingling, numbness, or weakness in the legs  signs and symptoms of infection like fever or chills; cough; flu-like symptoms; sore throat  vaginal bleeding Side effects that usually do not require medical attention (report to your doctor or health care professional if they continue or are bothersome):  aches, pains  constipation  diarrhea  headache  hot flashes  nausea, vomiting  pain at site where injected  stomach pain This list may not describe all possible side effects. Call your doctor for medical advice about side effects. You may report side effects to FDA at 1-800-FDA-1088. Where should I keep my medicine? This drug is given in a hospital or clinic and will not be stored at home. NOTE: This sheet is a summary. It may not cover all possible information. If you have questions about this medicine, talk to your doctor, pharmacist, or health care provider.  2020 [employer]/Gold Standard (2017-06-18 11:34:41)

## 2019-01-26 ENCOUNTER — Telehealth: Payer: Self-pay | Admitting: Oncology

## 2019-01-26 DIAGNOSIS — M542 Cervicalgia: Secondary | ICD-10-CM | POA: Diagnosis not present

## 2019-01-26 DIAGNOSIS — M2560 Stiffness of unspecified joint, not elsewhere classified: Secondary | ICD-10-CM | POA: Diagnosis not present

## 2019-01-26 LAB — CANCER ANTIGEN 27.29: CA 27.29: 37.1 U/mL (ref 0.0–38.6)

## 2019-01-26 NOTE — Telephone Encounter (Signed)
I talk with patient regarding schedule  

## 2019-02-01 DIAGNOSIS — H353131 Nonexudative age-related macular degeneration, bilateral, early dry stage: Secondary | ICD-10-CM | POA: Diagnosis not present

## 2019-02-01 DIAGNOSIS — H04123 Dry eye syndrome of bilateral lacrimal glands: Secondary | ICD-10-CM | POA: Diagnosis not present

## 2019-02-01 DIAGNOSIS — Z961 Presence of intraocular lens: Secondary | ICD-10-CM | POA: Diagnosis not present

## 2019-02-03 DIAGNOSIS — D519 Vitamin B12 deficiency anemia, unspecified: Secondary | ICD-10-CM | POA: Diagnosis not present

## 2019-02-11 DIAGNOSIS — M4802 Spinal stenosis, cervical region: Secondary | ICD-10-CM | POA: Diagnosis not present

## 2019-02-14 ENCOUNTER — Other Ambulatory Visit: Payer: Self-pay

## 2019-02-21 ENCOUNTER — Telehealth: Payer: Self-pay | Admitting: Pharmacist

## 2019-02-21 NOTE — Telephone Encounter (Signed)
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  Original Start date of oral chemotherapy:04/21/2016 Ibrance re-start: 06/29/2017 on to 75 mg by mouth once daily with whole food, take for 21 days on, 7 days off, repeat every 28 days  Dose changed on 08/03/2017 to75 mg by mouth onceevery OTHER daywith whole food, take for 21 days on, 7 days off, repeat every 28 days  Pt reports0tablets/doses of Ibrance 75mg tablets,1 tablet (75 mg) by mouth onceevery other day, taken for 21 days on, 7 days off, repeat every 28 days,missed in the last month  Pt reports the following side effects:mild, manageable fatigue, unchanged  Pertinent labs reviewed:OK for continued treatment. 01/25/2019 CA 27.29 is stable 01/18/19 PET scan shows stable disease  Dispensed samples to patient today:  Medication:Ibrance 75mg tablets Instructions:Take1 tablet (75 mg) by mouth onceevery other day, take for 21 days on, 7 days off, repeat every 28 days Quantity dispensed:42 Days supply:110 Manufacturer:[employer] Lot:[id number] Exp:07/2019  Patient knows to call the office with questions or concerns.  Jesse Mack, PharmD, BCPS, BCOP 02/21/2019  9:46 AM  [facility] [phone] 

## 2019-02-22 DIAGNOSIS — C7951 Secondary malignant neoplasm of bone: Secondary | ICD-10-CM | POA: Diagnosis not present

## 2019-02-22 DIAGNOSIS — C50919 Malignant neoplasm of unspecified site of unspecified female breast: Secondary | ICD-10-CM | POA: Diagnosis not present

## 2019-02-24 DIAGNOSIS — K1230 Oral mucositis (ulcerative), unspecified: Secondary | ICD-10-CM | POA: Diagnosis not present

## 2019-02-24 DIAGNOSIS — M47812 Spondylosis without myelopathy or radiculopathy, cervical region: Secondary | ICD-10-CM | POA: Diagnosis not present

## 2019-02-24 DIAGNOSIS — E669 Obesity, unspecified: Secondary | ICD-10-CM | POA: Diagnosis not present

## 2019-02-24 DIAGNOSIS — M5412 Radiculopathy, cervical region: Secondary | ICD-10-CM | POA: Diagnosis not present

## 2019-03-01 DIAGNOSIS — M5031 Other cervical disc degeneration,  high cervical region: Secondary | ICD-10-CM | POA: Diagnosis not present

## 2019-03-01 DIAGNOSIS — M5412 Radiculopathy, cervical region: Secondary | ICD-10-CM | POA: Diagnosis not present

## 2019-03-22 DIAGNOSIS — C50919 Malignant neoplasm of unspecified site of unspecified female breast: Secondary | ICD-10-CM | POA: Diagnosis not present

## 2019-03-22 DIAGNOSIS — C7951 Secondary malignant neoplasm of bone: Secondary | ICD-10-CM | POA: Diagnosis not present

## 2019-03-24 DIAGNOSIS — K219 Gastro-esophageal reflux disease without esophagitis: Secondary | ICD-10-CM | POA: Diagnosis not present

## 2019-03-24 DIAGNOSIS — E782 Mixed hyperlipidemia: Secondary | ICD-10-CM | POA: Diagnosis not present

## 2019-03-24 DIAGNOSIS — D519 Vitamin B12 deficiency anemia, unspecified: Secondary | ICD-10-CM | POA: Diagnosis not present

## 2019-04-12 DIAGNOSIS — E538 Deficiency of other specified B group vitamins: Secondary | ICD-10-CM | POA: Diagnosis not present

## 2019-04-18 NOTE — Progress Notes (Signed)
[facility]  [phone] [phone]   ID: Jade Mooney DOB: Jul 03, 1945  MR#: [id number]  DXI#:[id number]  Patient Care Team: Street, Sharon Mt, MD as PCP - General (Family Medicine) Clarene Essex, MD as Consulting Physician (Gastroenterology) Ivis Nicolson, Virgie Dad, MD as Consulting Physician (Oncology) Gaynelle Arabian, MD as Consulting Physician (Orthopedic Surgery) Gatha Mayer, MD as Consulting Physician (Radiation Oncology) Kristeen Miss, MD as Consulting Physician (Neurosurgery) Derwood Kaplan, MD as Consulting Physician (Oncology)   CHIEF COMPLAINT: metastatic breast cancer (s/p right mastectomy)  CURRENT TREATMENT: fulvestrant, denosumab/Xgeva, palbociclib   INTERVAL HISTORY: Jade Mooney returns today for follow-up and treatment of her estrogen receptor positive breast cancer.   She continues on palbociclib/Ibrance at 75 mg every other day, 21 days on 7 days off.  She tolerates this with no significant side effects.  In fact she does not feel any differently "off" week than she does on the on week.  She also continues on fulvestrant, which she receives today and every 28 days.  We only see her with every third dose and the other 2 doses she receives at the cancer center in [place].  Finally, she also continues on denosumab/Xgeva.  She receives that also every 28 days and again 2 out of every 3 doses she receives in [place] and every third dose including today's she receives it here.  Since her last visit, she underwent left screening mammography with tomography at [facility] earlier today.  Results are still pending  We are following her CA-27-29, which normalized at the most recent determination Lab Results  Component Value Date   CA2729 37.1 01/25/2019   CA2729 40.5 (H) 11/02/2018   CA2729 42.3 (H) 08/10/2018   CA2729 35.6 05/11/2018   CA2729 40.6 (H) 02/16/2018    REVIEW OF SYSTEMS: Addley had neck surgery under  Dr. Ellene Route and did very well.  The tingling feeling she had in her left arm has not completely disappeared but almost.  She has no neck stiffness.  She had a little bit of swallowing difficulty and hoarseness but that also is getting better.  She has not noted any change over her breasts.  Currently she is being a little lazy and not exercising but she says she is looking at her bicycle and thinking about getting on it.  Aside from this a detailed review of systems today was entirely stable   BREAST CANCER HISTORY: From the earlier summary notes:  Jade Mooney was referred to Dr. Clarene Essex for evaluation of abdominal discomfort and nausea. Exam and blood work including liver function tests was unremarkable aside from normochromic normocytic anemia. Cholecystitis was suspected and the patient underwent endoscopy followed by abdominal/ pelvic CT scan on 09/29/2013. This showed showed a normal gallbladder. Incidental findings included multiple simple cysts in the liver and aortic atherosclerosis. However mixed lytic and sclerotic bony lesions were noted. On 10/10/2013 the patient underwent biopsy of the right iliac bone, and this showed ([id number]) metastatic invasive ductal carcinoma, grade 2, estrogen and progesterone receptor strongly positive.  [name] has a remote history of breast cancer, which we tried to reconstruct. She was originally diagnosed early in 1997 with stage III disease, and underwent right mastectomy followed by adjuvant chemotherapy. She then participated in a [facility] protocol with high-dose chemotherapy followed by stem cell rescue 12/22/1995. Unfortunately later that year liver lesions were noted and were biopsy-proven to be metastatic breast cancer. This was not felt to be resectable. However the tumor was estrogen receptor positive  and HER-2 positive. The patient was treated with Herceptin (she does not know for what period of time) and was then on tamoxifen until November of 2002. She also  participated in a vaccine study at [facility].  Hula's subsequent history is as detailed below   PAST MEDICAL HISTORY: Past Medical History:  Diagnosis Date  . Anxiety   . Cancer [facility])    Breast 1997 right tx with mastectomy and chemo, metastatic now  . GERD (gastroesophageal reflux disease)   . Hypercholesterolemia   . Hypertension   . Hypothyroidism   . Osteoarthritis    oa  . Personal history of chemotherapy   . Personal history of radiation therapy   . TIA (transient ischemic attack) last 11-08-15   x 2 total    PAST SURGICAL HISTORY: Past Surgical History:  Procedure Laterality Date  . CATARACT EXTRACTION, BILATERAL    . MASTECTOMY MODIFIED RADICAL Right 1997  . porta cath insertion  1997   later removed  . radiation tx  finished 02-25-16   x 20 tx  . TONSILLECTOMY    . TOTAL HIP ARTHROPLASTY Left 03/12/2016   Procedure: LEFT TOTAL HIP ARTHROPLASTY ANTERIOR APPROACH;  Surgeon: Gaynelle Arabian, MD;  Location: [facility];  Service: Orthopedics;  Laterality: Left;  . TOTAL KNEE ARTHROPLASTY Left   . TUBAL LIGATION      FAMILY HISTORY: Family History  Problem Relation Age of Onset  . Breast cancer Maternal Aunt    The patient's father died at the age of 47 from heart disease. The patient's mother died at the age of 38. The patient had no brothers, one sister. One of the patient's mother's 5 sisters was diagnosed with breast cancer in her 37s   GYNECOLOGIC HISTORY:  No LMP recorded. Patient is postmenopausal. Menarche age 11, the patient is GX P0. She stopped having periods approximately 1994. She used hormone replacement until 1997. 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 Her husband [name] (goes by [name]") was [employer]. of information all services at [employer]. They live alone, with no pets.   ADVANCED DIRECTIVES: In place   HEALTH MAINTENANCE: Social History     Tobacco Use  . Smoking status: Never Smoker  . Smokeless tobacco: Never Used  Substance Use Topics  . Alcohol use: Yes    Comment: 1 glass wine 4-5 x week  . Drug use: No    Colonoscopy: 2013  PAP: 2010  Bone density: 2012  Lipid panel:  Allergies  Allergen Reactions  . Amoxicillin Hives    Has patient had a PCN reaction causing immediate rash, facial/tongue/throat swelling, SOB or lightheadedness with hypotension:unsure Has patient had a PCN reaction causing severe rash involving mucus membranes or skin necrosis:No Has patient had a PCN reaction that required hospitalization:No Has patient had a PCN reaction occurring within the last 10 years: Yes If all of the above answers are "NO", then may proceed with Cephalosporin use.   Marland Kitchen Percocet [Oxycodone-Acetaminophen] Nausea And Vomiting  . Percodan [Oxycodone-Aspirin] Nausea And Vomiting  . Tramadol Itching    Current Outpatient Medications  Medication Sig Dispense Refill  . acetaminophen (TYLENOL) 500 MG tablet Take 1,000 mg by mouth every 6 (six) hours as needed for mild pain. Reported on 04/17/2015    . calcium carbonate (OSCAL) 1500 (600 Ca) MG TABS tablet Take 600 mg of elemental calcium by mouth 2 (two) times  daily with a meal.    . cholecalciferol (VITAMIN D) 1000 units tablet Take 1,000 Units by mouth daily.    . clopidogrel (PLAVIX) 75 MG tablet Take 1 tablet (75 mg total) by mouth daily.    . cyanocobalamin (,VITAMIN B-12,) 1000 MCG/ML injection Inject 1,000 mcg into the muscle once.    Marland Kitchen levothyroxine (SYNTHROID, LEVOTHROID) 88 MCG tablet Take 88 mcg by mouth daily before breakfast.    . Lutein 6 MG TABS Take 6 mg by mouth 2 (two) times daily.     . magnesium gluconate (MAGONATE) 500 MG tablet Take 500 mg by mouth at bedtime.    . meclizine (ANTIVERT) 25 MG tablet Take 25 mg by mouth 3 (three) times daily as needed for dizziness.    Marland Kitchen METRONIDAZOLE, TOPICAL, 0.75 % LOTN Apply 1 application topically at bedtime. Apply  to face for rosacea    . palbociclib (IBRANCE) 75 MG capsule Take 1 capsule (75 mg total) by mouth daily with breakfast. Take whole with food on days 1-21, every 28 days. 21 capsule 2  . pantoprazole (PROTONIX) 40 MG tablet Take 40 mg by mouth 2 (two) times daily. Before breakfast and before supper    . Polyethyl Glycol-Propyl Glycol (SYSTANE ULTRA) 0.4-0.3 % SOLN Place 1-2 drops into both eyes 2 (two) times daily.    . polyethylene glycol (MIRALAX / GLYCOLAX) packet Take 4 g by mouth daily. 1 teaspoonful in the morning    . rOPINIRole (REQUIP) 0.5 MG tablet Take 0.5 mg by mouth 3 (three) times daily.    . rosuvastatin (CRESTOR) 20 MG tablet Take 1 tablet (20 mg total) by mouth daily.    . traZODone (DESYREL) 50 MG tablet Take 25 mg by mouth at bedtime.      No current facility-administered medications for this visit.    OBJECTIVE: Older white woman who appears stated age  53:   04/19/19 1425  BP: (!) 153/82  Pulse: 71  Resp: 16  Temp: 98.2 F (36.8 C)  SpO2: 100%     Body mass index is 30.13 kg/m.    ECOG FS:1 - Symptomatic but completely ambulatory  Sclerae unicteric, EOMs intact Wearing a mask No cervical or supraclavicular adenopathy; neck incision healing nicely, no swelling erythema or dehiscence, good range of motion Lungs no rales or rhonchi Heart regular rate and rhythm Abd soft, nontender, positive bowel sounds MSK no focal spinal tenderness, no upper extremity lymphedema Neuro: nonfocal, well oriented, appropriate affect Breasts: The right breast is status post mastectomy.  There are some telangiectasias over the area of the boost.  There is no evidence of local recurrence.  Left breast is benign.  Both axillae are benign.   LAB RESULTS:  CMP     Component Value Date/Time   NA 140 04/19/2019 1336   NA 138 02/09/2017 1213   K 4.0 04/19/2019 1336   K 3.9 02/09/2017 1213   CL 106 04/19/2019 1336   CO2 22 04/19/2019 1336   CO2 27 02/09/2017 1213   GLUCOSE  133 (H) 04/19/2019 1336   GLUCOSE 100 02/09/2017 1213   BUN 16 04/19/2019 1336   BUN 12.0 02/09/2017 1213   CREATININE 0.87 04/19/2019 1336   CREATININE 0.9 02/09/2017 1213   CALCIUM 8.9 04/19/2019 1336   CALCIUM 8.7 02/09/2017 1213   PROT 6.8 04/19/2019 1336   PROT 6.5 02/09/2017 1213   ALBUMIN 4.5 04/19/2019 1336   ALBUMIN 4.1 02/09/2017 1213   AST 20 04/19/2019 1336   AST  24 02/09/2017 1213   ALT 14 04/19/2019 1336   ALT 18 02/09/2017 1213   ALKPHOS 53 04/19/2019 1336   ALKPHOS 62 02/09/2017 1213   BILITOT 0.3 04/19/2019 1336   BILITOT 0.34 02/09/2017 1213   GFRNONAA >60 04/19/2019 1336   GFRAA >60 04/19/2019 1336   No results found for: SPEP  Lab Results  Component Value Date   WBC 3.7 (L) 04/19/2019   NEUTROABS 2.4 04/19/2019   HGB 12.7 04/19/2019   HCT 38.6 04/19/2019   MCV 90.8 04/19/2019   PLT 209 04/19/2019      Chemistry      Component Value Date/Time   NA 140 04/19/2019 1336   NA 138 02/09/2017 1213   K 4.0 04/19/2019 1336   K 3.9 02/09/2017 1213   CL 106 04/19/2019 1336   CO2 22 04/19/2019 1336   CO2 27 02/09/2017 1213   BUN 16 04/19/2019 1336   BUN 12.0 02/09/2017 1213   CREATININE 0.87 04/19/2019 1336   CREATININE 0.9 02/09/2017 1213      Component Value Date/Time   CALCIUM 8.9 04/19/2019 1336   CALCIUM 8.7 02/09/2017 1213   ALKPHOS 53 04/19/2019 1336   ALKPHOS 62 02/09/2017 1213   AST 20 04/19/2019 1336   AST 24 02/09/2017 1213   ALT 14 04/19/2019 1336   ALT 18 02/09/2017 1213   BILITOT 0.3 04/19/2019 1336   BILITOT 0.34 02/09/2017 1213      No results for input(s): INR in the last 168 hours.  Urinalysis    Component Value Date/Time   COLORURINE STRAW (A) 03/05/2016 1350    STUDIES:  No results found.   ASSESSMENT: 74 y.o. [place] woman with stage IV right breast cancer as of NOV 2007 (bone metastases, subcutaneous nodules)  (1) s/p Right mastectomy 1997 for an estrogen receptor and HER-2 positive breast cancer, treated  with  (a) adjuvant chemotherapy according to CALGB 9640 (taxotere x 4 cycles)  (b) high-dose chemotherapy (cyclophosphamide, carboplatin, BCNU) followed by stem cell rescue at [place] 12/22/1995  (c) biopsy-proven metastatic disease November 2007, estrogen receptor and HER-2 positive  (d) trastuzumab (dose? Duration?)  (e) tamoxifen for 5 years completed 2002  (f) participation in a vaccine study at [facility] 2003 (CEA primed dendritic cells, HER-2 primed dendritic cells)  (2) status post right iliac crest biopsy 10/10/2013 for invasive ductal carcinoma, grade 2, estrogen and progesterone receptor positive  (3) zolendronate started 10/12/2013,  repeated every 12 weeks  (a) dexa scan 07/28/2013 normal  (b) zolendronate discontinued after January 2019 dose  (4) biopsy of a scalp lesion 10/17/2013 showed metastatic breast cancer, HER-2/neu negative  (a) biopsy of a right anterior chest wall nodule August 2017 shows adenocarcinoma  (5) anastrozole started 10/28/2013, changed to letrozole 06/27/2016  (a) measurable disease = subcutaneous nodules in scalp and LLQ abdomen  (b)  CA-27-29 is informative  (c)  Repeat PET scan 12/28/2014 shows continuing response ; exam shows resolution of scalp lesions  (d) new skin lesion removed from right anterior chest wall August 2017  (e) status post radiation to the anterior lower right chest wall completed 02/25/2017 (20 sessions)   (f) PET scan 04/04/2016 shows 2 new foci of metabolic activity (T8 and manubrium)  (g) palbociclib added 04/21/2016 at 125 mg 21/7  (h) palbociclib dose dropped to 100 mg 21/7 starting with the March 2018 cycle  (i) bone scan 09/26/2016 is stable  (j) PET scan 02/03/2017 shows her bone metastases, and in addition a 0.5 cm right middle  lobe nodule which may be new  (k) MRI of the cervical spine shows collapse of the left lateral mass of C3.  There is no soft tissue mass or focal neural impingement.  (l) palbociclib reduced to 75 mg as  of September 2018  (m) palbociclib held during cervical spine irradiation finished in mid February, 2019  (n) letrozole discontinued February 2019 secondary to likely progression   (6) neoplasia associated pain  (a) painful blastic lesion left femoral intertrochanteric region: Status post radiation completed November 2015  (b) pain left knee, status post TKR remotely  (c) left total hip replacement 03/12/2016  (7) palliative radiation to the cervical spine completed 05/08/2017 in [place]  (8) fulvestrant started 05/04/2017  (a) Palbociclib resumed 06/29/2017, currently at 75 mg every other day, 21 days on 7 days off  (9) denosumab/Xgeva started 06/01/2017, repeated every 28 days  (10) follow-up/staging studies  (a) PET scan 08/11/2017 shows no evidence for metabolically active tumor.  Sclerotic bone mets do not show hypermetabolism  (b) PET scan 02/11/2018 shows continuing excellent tumor control  (c) PET scan 08/05/2018 shows same bone lesions but no active disease  (d) PET scan 01/18/2019 finds stable disease  PLAN: Melanny did very well with her neck surgery and has better use of her left arm right now, and fewer nerve symptoms.  She continues to tolerate her fulvestrant and palbociclib well and her counts today are excellent.  We could even increase the dose although what we will do is wait until there is evidence of progression and then change her to a different agent.  The question is when to restage her.  Normally we would get a repeat PET in 6 months which would be before the April visit here.  She is doing so well and is so stable though that after much discussion we decided we would simply keep an eye on her CA 27-29.  If that trends upward or if she has any new symptoms or signs of disease progression we will repeat a PET.  Accordingly I am not scheduling 1 before her return visit here in April.  We have changed oral chemotherapy pharmacists and she has not yet met Wells Guiles, her  new 1, but I will make sure she does meet her when she returns here in April.  I urged her to set up an appointment with Dr. Hinton Rao so that she can become acquainted with her and have her own oncologist in [place] even if she wishes to continue to be seen here.  Total encounter time 35 minutes.*   Cayci Mcnabb, Virgie Dad, MD  04/19/19 2:41 PM Medical Oncology and Hematology [facility] [place], [place] [address] Tel. [phone]    Fax. [phone]   I, Wilburn Mylar, am acting as scribe for Dr. Virgie Dad. Keidrick Murty.  I, Lurline Del MD, have reviewed the above documentation for accuracy and completeness, and I agree with the above.   *Total Encounter Time as defined by the Centers for Medicare and Medicaid Services includes, in addition to the face-to-face time of a patient visit (documented in the note above) non-face-to-face time: obtaining and reviewing outside history, ordering and reviewing medications, tests or procedures, care coordination (communications with other health care professionals or caregivers) and documentation in the medical record.

## 2019-04-19 ENCOUNTER — Inpatient Hospital Stay: Payer: Medicare Other

## 2019-04-19 ENCOUNTER — Ambulatory Visit
Admission: RE | Admit: 2019-04-19 | Discharge: 2019-04-19 | Disposition: A | Discharge status: Home / Self Care | Payer: Medicare Other | Source: Ambulatory Visit | Attending: Oncology | Admitting: Oncology

## 2019-04-19 ENCOUNTER — Inpatient Hospital Stay: Payer: Medicare Other | Attending: Oncology | Admitting: Oncology

## 2019-04-19 ENCOUNTER — Other Ambulatory Visit: Payer: Self-pay

## 2019-04-19 VITALS — BP 153/82 | HR 71 | Temp 98.2°F | Resp 16 | Ht 63.0 in | Wt 170.1 lb

## 2019-04-19 DIAGNOSIS — Z5111 Encounter for antineoplastic chemotherapy: Secondary | ICD-10-CM | POA: Diagnosis not present

## 2019-04-19 DIAGNOSIS — G459 Transient cerebral ischemic attack, unspecified: Secondary | ICD-10-CM

## 2019-04-19 DIAGNOSIS — Z1231 Encounter for screening mammogram for malignant neoplasm of breast: Secondary | ICD-10-CM | POA: Diagnosis not present

## 2019-04-19 DIAGNOSIS — C50811 Malignant neoplasm of overlapping sites of right female breast: Secondary | ICD-10-CM | POA: Diagnosis not present

## 2019-04-19 DIAGNOSIS — G893 Neoplasm related pain (acute) (chronic): Secondary | ICD-10-CM

## 2019-04-19 DIAGNOSIS — C50919 Malignant neoplasm of unspecified site of unspecified female breast: Secondary | ICD-10-CM

## 2019-04-19 DIAGNOSIS — M1611 Unilateral primary osteoarthritis, right hip: Secondary | ICD-10-CM | POA: Diagnosis not present

## 2019-04-19 DIAGNOSIS — Z170 Estrogen receptor positive status [ER+]: Secondary | ICD-10-CM

## 2019-04-19 DIAGNOSIS — Z7189 Other specified counseling: Secondary | ICD-10-CM | POA: Diagnosis not present

## 2019-04-19 DIAGNOSIS — I700 Atherosclerosis of aorta: Secondary | ICD-10-CM

## 2019-04-19 DIAGNOSIS — C7951 Secondary malignant neoplasm of bone: Secondary | ICD-10-CM | POA: Diagnosis not present

## 2019-04-19 DIAGNOSIS — C50912 Malignant neoplasm of unspecified site of left female breast: Secondary | ICD-10-CM

## 2019-04-19 DIAGNOSIS — C50911 Malignant neoplasm of unspecified site of right female breast: Secondary | ICD-10-CM

## 2019-04-19 LAB — COMPREHENSIVE METABOLIC PANEL
ALT: 14 U/L (ref 0–44)
AST: 20 U/L (ref 15–41)
Albumin: 4.5 g/dL (ref 3.5–5.0)
Alkaline Phosphatase: 53 U/L (ref 38–126)
Anion gap: 12 (ref 5–15)
BUN: 16 mg/dL (ref 8–23)
CO2: 22 mmol/L (ref 22–32)
Calcium: 8.9 mg/dL (ref 8.9–10.3)
Chloride: 106 mmol/L (ref 98–111)
Creatinine, Ser: 0.87 mg/dL (ref 0.44–1.00)
GFR calc Af Amer: >60 mL/min (ref >60)
GFR calc non Af Amer: >60 mL/min (ref >60)
Glucose, Bld: 133 mg/dL — ABNORMAL HIGH (ref 70–99)
Potassium: 4 mmol/L (ref 3.5–5.1)
Sodium: 140 mmol/L (ref 135–145)
Total Bilirubin: 0.3 mg/dL (ref 0.3–1.2)
Total Protein: 6.8 g/dL (ref 6.5–8.1)

## 2019-04-19 LAB — CBC WITH DIFFERENTIAL/PLATELET
Abs Immature Granulocytes: 0.02 10*3/uL (ref 0.00–0.07)
Basophils Absolute: 0.1 10*3/uL (ref 0.0–0.1)
Basophils Relative: 2 %
Eosinophils Absolute: 0 10*3/uL (ref 0.0–0.5)
Eosinophils Relative: 1 %
HCT: 38.6 % (ref 36.0–46.0)
Hemoglobin: 12.7 g/dL (ref 12.0–15.0)
Immature Granulocytes: 1 %
Lymphocytes Relative: 22 %
Lymphs Abs: 0.8 10*3/uL (ref 0.7–4.0)
MCH: 29.9 pg (ref 26.0–34.0)
MCHC: 32.9 g/dL (ref 30.0–36.0)
MCV: 90.8 fL (ref 80.0–100.0)
Monocytes Absolute: 0.4 10*3/uL (ref 0.1–1.0)
Monocytes Relative: 10 %
Neutro Abs: 2.4 10*3/uL (ref 1.7–7.7)
Neutrophils Relative %: 64 %
Platelets: 209 10*3/uL (ref 150–400)
RBC: 4.25 MIL/uL (ref 3.87–5.11)
RDW: 14.3 % (ref 11.5–15.5)
WBC: 3.7 10*3/uL — ABNORMAL LOW (ref 4.0–10.5)
nRBC: 0 % (ref 0.0–0.2)

## 2019-04-19 MED ORDER — DENOSUMAB 120 MG/1.7ML ~~LOC~~ SOLN
SUBCUTANEOUS | Status: AC
  Filled 2019-04-19: qty 1.7

## 2019-04-19 MED ORDER — PALBOCICLIB 75 MG PO CAPS: 75.0000 mg | ORAL_CAPSULE | Freq: Every day | ORAL | 6 refills | Status: DC

## 2019-04-19 MED ORDER — DENOSUMAB 120 MG/1.7ML ~~LOC~~ SOLN
120.0000 mg | Freq: Once | SUBCUTANEOUS | Status: AC
  Administered 2019-04-19: 120 mg via SUBCUTANEOUS

## 2019-04-19 MED ORDER — FULVESTRANT 250 MG/5ML IM SOLN
500.0000 mg | Freq: Once | INTRAMUSCULAR | Status: AC
  Administered 2019-04-19: 500 mg via INTRAMUSCULAR
  Filled 2019-04-19: qty 10

## 2019-04-20 ENCOUNTER — Encounter: Payer: Self-pay | Admitting: Oncology

## 2019-04-20 ENCOUNTER — Telehealth: Payer: Self-pay | Admitting: Oncology

## 2019-04-20 LAB — CANCER ANTIGEN 27.29: CA 27.29: 37.8 U/mL (ref 0.0–38.6)

## 2019-04-20 NOTE — Telephone Encounter (Signed)
I could not reach patient regarding schedule  °

## 2019-04-23 DIAGNOSIS — E039 Hypothyroidism, unspecified: Secondary | ICD-10-CM | POA: Diagnosis not present

## 2019-04-23 DIAGNOSIS — I10 Essential (primary) hypertension: Secondary | ICD-10-CM | POA: Diagnosis not present

## 2019-04-23 DIAGNOSIS — E782 Mixed hyperlipidemia: Secondary | ICD-10-CM | POA: Diagnosis not present

## 2019-04-27 ENCOUNTER — Ambulatory Visit: Payer: Medicare Other

## 2019-04-27 ENCOUNTER — Ambulatory Visit: Payer: Medicare Other | Admitting: Oncology

## 2019-04-27 DIAGNOSIS — M4802 Spinal stenosis, cervical region: Secondary | ICD-10-CM | POA: Diagnosis not present

## 2019-05-11 DIAGNOSIS — Z79899 Other long term (current) drug therapy: Secondary | ICD-10-CM | POA: Diagnosis not present

## 2019-05-11 DIAGNOSIS — E785 Hyperlipidemia, unspecified: Secondary | ICD-10-CM | POA: Diagnosis not present

## 2019-05-11 DIAGNOSIS — E039 Hypothyroidism, unspecified: Secondary | ICD-10-CM | POA: Diagnosis not present

## 2019-05-11 DIAGNOSIS — D519 Vitamin B12 deficiency anemia, unspecified: Secondary | ICD-10-CM | POA: Diagnosis not present

## 2019-05-11 DIAGNOSIS — E538 Deficiency of other specified B group vitamins: Secondary | ICD-10-CM | POA: Diagnosis not present

## 2019-05-17 DIAGNOSIS — Z5111 Encounter for antineoplastic chemotherapy: Secondary | ICD-10-CM | POA: Diagnosis not present

## 2019-05-17 DIAGNOSIS — C7951 Secondary malignant neoplasm of bone: Secondary | ICD-10-CM | POA: Diagnosis not present

## 2019-05-17 DIAGNOSIS — C50919 Malignant neoplasm of unspecified site of unspecified female breast: Secondary | ICD-10-CM | POA: Diagnosis not present

## 2019-05-22 DIAGNOSIS — I10 Essential (primary) hypertension: Secondary | ICD-10-CM | POA: Diagnosis not present

## 2019-05-22 DIAGNOSIS — E782 Mixed hyperlipidemia: Secondary | ICD-10-CM | POA: Diagnosis not present

## 2019-06-06 DIAGNOSIS — E538 Deficiency of other specified B group vitamins: Secondary | ICD-10-CM | POA: Diagnosis not present

## 2019-06-06 DIAGNOSIS — C50911 Malignant neoplasm of unspecified site of right female breast: Secondary | ICD-10-CM | POA: Diagnosis not present

## 2019-06-14 DIAGNOSIS — Z5111 Encounter for antineoplastic chemotherapy: Secondary | ICD-10-CM | POA: Diagnosis not present

## 2019-06-14 DIAGNOSIS — C50919 Malignant neoplasm of unspecified site of unspecified female breast: Secondary | ICD-10-CM | POA: Diagnosis not present

## 2019-06-14 DIAGNOSIS — C7951 Secondary malignant neoplasm of bone: Secondary | ICD-10-CM | POA: Diagnosis not present

## 2019-06-15 DIAGNOSIS — R1314 Dysphagia, pharyngoesophageal phase: Secondary | ICD-10-CM | POA: Diagnosis not present

## 2019-06-22 DIAGNOSIS — E785 Hyperlipidemia, unspecified: Secondary | ICD-10-CM | POA: Diagnosis not present

## 2019-06-22 DIAGNOSIS — I10 Essential (primary) hypertension: Secondary | ICD-10-CM | POA: Diagnosis not present

## 2019-07-01 DIAGNOSIS — E538 Deficiency of other specified B group vitamins: Secondary | ICD-10-CM | POA: Diagnosis not present

## 2019-07-11 NOTE — Progress Notes (Signed)
[facility]  [phone] [phone]   ID: Jade Mooney DOB: [birth year]  MR#: [id number]  NOI#:[id number]  Patient Care Team: Street, Sharon Mt, MD as PCP - General (Family Medicine) Clarene Essex, MD as Consulting Physician (Gastroenterology) Chauncey Bruno, Virgie Dad, MD as Consulting Physician (Oncology) Gaynelle Arabian, MD as Consulting Physician (Orthopedic Surgery) Gatha Mayer, MD as Consulting Physician (Radiation Oncology) Kristeen Miss, MD as Consulting Physician (Neurosurgery) Derwood Kaplan, MD as Consulting Physician (Oncology)   CHIEF COMPLAINT: metastatic breast cancer (s/p right mastectomy)  CURRENT TREATMENT: fulvestrant, denosumab/Xgeva, palbociclib   INTERVAL HISTORY: Jade Mooney returns today for follow-up and treatment of her estrogen receptor positive breast cancer.   She continues on palbociclib/Ibrance at 75 mg every other day, 21 days on 7 days off.  She is doing very well on this dose, with no diarrhea, rash, or other side effects.  She also continues on fulvestrant, which she receives today and every 28 days.  Aside from the discomfort of the administration of this medication she tolerates it fine.  Finally, she also continues on denosumab/Xgeva.  She receives that also every 28 days and again 2 out of every 3 doses she receives in [place] and every third dose including today's she receives it here.  She is being very careful to maintain excellent dental care and to see her dentist regularly.  Results from her recent left screening mammography with tomography at [facility] on 04/19/2019 showed: breast density category C; no evidence of mlaignancy.    We are following her CA-27-29, which normalized at the most recent determination Lab Results  Component Value Date   CA2729 37.8 04/19/2019   CA2729 37.1 01/25/2019   CA2729 40.5 (H) 11/02/2018   CA2729 42.3 (H) 08/10/2018   CA2729 35.6 05/11/2018     REVIEW OF SYSTEMS: Annamaria tells me she is "lazy", and not walking as much as previously, but also there is a lot of pollen out there and this bothers her.  She has had a little bit of a cough, worse in the evening and early morning, occasionally with a little phlegm, on one occasion with a little bit of blood.  She has no pleurisy, no fever, and no purulent sputum.  She is up-to-date on the vaccine, receiving the second Pfizer dose early March  BREAST CANCER HISTORY: From the earlier summary notes:  [name] was referred to Dr. Clarene Essex for evaluation of abdominal discomfort and nausea. Exam and blood work including liver function tests was unremarkable aside from normochromic normocytic anemia. Cholecystitis was suspected and the patient underwent endoscopy followed by abdominal/ pelvic CT scan on 09/29/2013. This showed showed a normal gallbladder. Incidental findings included multiple simple cysts in the liver and aortic atherosclerosis. However mixed lytic and sclerotic bony lesions were noted. On 10/10/2013 the patient underwent biopsy of the right iliac bone, and this showed ([id number]) metastatic invasive ductal carcinoma, grade 2, estrogen and progesterone receptor strongly positive.  Jade Mooney has a remote history of breast cancer, which we tried to reconstruct. She was originally diagnosed early in 1997 with stage III disease, and underwent right mastectomy followed by adjuvant chemotherapy. She then participated in a [facility] protocol with high-dose chemotherapy followed by stem cell rescue 12/22/1995. Unfortunately later that year liver lesions were noted and were biopsy-proven to be metastatic breast cancer. This was not felt to be resectable. However the tumor was estrogen receptor positive and HER-2 positive. The patient was treated with Herceptin (she does not know for  what period of time) and was then on tamoxifen until November of 2002. She also participated in a vaccine study at  [facility].  Jade Mooney's subsequent history is as detailed below   PAST MEDICAL HISTORY: Past Medical History:  Diagnosis Date  . Anxiety   . Cancer [facility])    Breast 1997 right tx with mastectomy and chemo, metastatic now  . GERD (gastroesophageal reflux disease)   . Hypercholesterolemia   . Hypertension   . Hypothyroidism   . Osteoarthritis    oa  . Personal history of chemotherapy   . Personal history of radiation therapy   . TIA (transient ischemic attack) last 11-08-15   x 2 total    PAST SURGICAL HISTORY: Past Surgical History:  Procedure Laterality Date  . CATARACT EXTRACTION, BILATERAL    . MASTECTOMY MODIFIED RADICAL Right 1997  . porta cath insertion  1997   later removed  . radiation tx  finished 02-25-16   x 20 tx  . TONSILLECTOMY    . TOTAL HIP ARTHROPLASTY Left 03/12/2016   Procedure: LEFT TOTAL HIP ARTHROPLASTY ANTERIOR APPROACH;  Surgeon: Gaynelle Arabian, MD;  Location: [facility];  Service: Orthopedics;  Laterality: Left;  . TOTAL KNEE ARTHROPLASTY Left   . TUBAL LIGATION      FAMILY HISTORY: Family History  Problem Relation Age of Onset  . Breast cancer Maternal Aunt    The patient's father died at the age of 48 from heart disease. The patient's mother died at the age of 94. The patient had no brothers, one sister. One of the patient's mother's 5 sisters was diagnosed with breast cancer in her 57s   GYNECOLOGIC HISTORY:  No LMP recorded. Patient is postmenopausal. Menarche age 15, the patient is GX P0. She stopped having periods approximately 1994. She used hormone replacement until 1997. 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 Her husband [name] (goes by [name]") was [employer]. of information all services at [employer]. They live alone, with no pets.   ADVANCED DIRECTIVES: In place   HEALTH MAINTENANCE: Social History   Tobacco Use  . Smoking status:  Never Smoker  . Smokeless tobacco: Never Used  Substance Use Topics  . Alcohol use: Yes    Comment: 1 glass wine 4-5 x week  . Drug use: No    Colonoscopy: 2013  PAP: 2010  Bone density: 2012  Lipid panel:  Allergies  Allergen Reactions  . Amoxicillin Hives    Has patient had a PCN reaction causing immediate rash, facial/tongue/throat swelling, SOB or lightheadedness with hypotension:unsure Has patient had a PCN reaction causing severe rash involving mucus membranes or skin necrosis:No Has patient had a PCN reaction that required hospitalization:No Has patient had a PCN reaction occurring within the last 10 years: Yes If all of the above answers are "NO", then may proceed with Cephalosporin use.   Marland Kitchen Percocet [Oxycodone-Acetaminophen] Nausea And Vomiting  . Percodan [Oxycodone-Aspirin] Nausea And Vomiting  . Tramadol Itching    Current Outpatient Medications  Medication Sig Dispense Refill  . acetaminophen (TYLENOL) 500 MG tablet Take 1,000 mg by mouth every 6 (six) hours as needed for mild pain. Reported on 04/17/2015    . calcium carbonate (OSCAL) 1500 (600 Ca) MG TABS tablet Take 600 mg of elemental calcium by mouth 2 (two) times daily with a meal.    . cholecalciferol (VITAMIN D) 1000 units tablet Take  1,000 Units by mouth daily.    . clopidogrel (PLAVIX) 75 MG tablet Take 1 tablet (75 mg total) by mouth daily.    . cyanocobalamin (,VITAMIN B-12,) 1000 MCG/ML injection Inject 1,000 mcg into the muscle once.    . famotidine (PEPCID) 40 MG tablet Take 1 tablet (40 mg total) by mouth 2 (two) times daily.    Marland Kitchen levothyroxine (SYNTHROID, LEVOTHROID) 88 MCG tablet Take 88 mcg by mouth daily before breakfast.    . Lutein 6 MG TABS Take 6 mg by mouth 2 (two) times daily.     . magnesium gluconate (MAGONATE) 500 MG tablet Take 500 mg by mouth at bedtime.    . meclizine (ANTIVERT) 25 MG tablet Take 25 mg by mouth 3 (three) times daily as needed for dizziness.    Marland Kitchen METRONIDAZOLE, TOPICAL,  0.75 % LOTN Apply 1 application topically at bedtime. Apply to face for rosacea    . Omega-3 Fatty Acids (FISH OIL) 500 MG CAPS Take by mouth.  0  . palbociclib (IBRANCE) 75 MG capsule Take 1 capsule (75 mg total) by mouth daily with breakfast. Take whole with food on days 1-21, every 28 days. 21 capsule 6  . pantoprazole (PROTONIX) 40 MG tablet Take 40 mg by mouth 2 (two) times daily. Before breakfast and before supper    . Polyethyl Glycol-Propyl Glycol (SYSTANE ULTRA) 0.4-0.3 % SOLN Place 1-2 drops into both eyes 2 (two) times daily.    . polyethylene glycol (MIRALAX / GLYCOLAX) packet Take 4 g by mouth daily. 1 teaspoonful in the morning    . rOPINIRole (REQUIP) 0.5 MG tablet Take 0.5 mg by mouth 3 (three) times daily.    . rosuvastatin (CRESTOR) 20 MG tablet Take 1 tablet (20 mg total) by mouth daily.    . traZODone (DESYREL) 50 MG tablet Take 25 mg by mouth at bedtime.      No current facility-administered medications for this visit.    OBJECTIVE: white woman who appears stated age  28:   07/12/19 1304  BP: (!) 165/90  Pulse: (!) 54  Resp: 18  Temp: 97.8 F (36.6 C)  SpO2: 96%     Body mass index is 30.19 kg/m.    ECOG FS:1 - Symptomatic but completely ambulatory  Sclerae unicteric, EOMs intact Wearing a mask No cervical or supraclavicular adenopathy Lungs no rales or rhonchi Heart regular rate and rhythm Abd soft, nontender, positive bowel sounds MSK no focal spinal tenderness, no upper extremity lymphedema Neuro: nonfocal, well oriented, appropriate affect Breasts: Status post right mastectomy, with no evidence of local recurrence.  The area of telangiectasia is unchanged.  Left breast benign.  Both axillae are benign.   LAB RESULTS:  CMP     Component Value Date/Time   NA 140 04/19/2019 1336   NA 138 02/09/2017 1213   K 4.0 04/19/2019 1336   K 3.9 02/09/2017 1213   CL 106 04/19/2019 1336   CO2 22 04/19/2019 1336   CO2 27 02/09/2017 1213   GLUCOSE 133 (H)  04/19/2019 1336   GLUCOSE 100 02/09/2017 1213   BUN 16 04/19/2019 1336   BUN 12.0 02/09/2017 1213   CREATININE 0.87 04/19/2019 1336   CREATININE 0.9 02/09/2017 1213   CALCIUM 8.9 04/19/2019 1336   CALCIUM 8.7 02/09/2017 1213   PROT 6.8 04/19/2019 1336   PROT 6.5 02/09/2017 1213   ALBUMIN 4.5 04/19/2019 1336   ALBUMIN 4.1 02/09/2017 1213   AST 20 04/19/2019 1336   AST 24 02/09/2017  1213   ALT 14 04/19/2019 1336   ALT 18 02/09/2017 1213   ALKPHOS 53 04/19/2019 1336   ALKPHOS 62 02/09/2017 1213   BILITOT 0.3 04/19/2019 1336   BILITOT 0.34 02/09/2017 1213   GFRNONAA >60 04/19/2019 1336   GFRAA >60 04/19/2019 1336   No results found for: SPEP  Lab Results  Component Value Date   WBC 3.7 (L) 04/19/2019   NEUTROABS 2.4 04/19/2019   HGB 12.7 04/19/2019   HCT 38.6 04/19/2019   MCV 90.8 04/19/2019   PLT 209 04/19/2019      Chemistry      Component Value Date/Time   NA 140 04/19/2019 1336   NA 138 02/09/2017 1213   K 4.0 04/19/2019 1336   K 3.9 02/09/2017 1213   CL 106 04/19/2019 1336   CO2 22 04/19/2019 1336   CO2 27 02/09/2017 1213   BUN 16 04/19/2019 1336   BUN 12.0 02/09/2017 1213   CREATININE 0.87 04/19/2019 1336   CREATININE 0.9 02/09/2017 1213      Component Value Date/Time   CALCIUM 8.9 04/19/2019 1336   CALCIUM 8.7 02/09/2017 1213   ALKPHOS 53 04/19/2019 1336   ALKPHOS 62 02/09/2017 1213   AST 20 04/19/2019 1336   AST 24 02/09/2017 1213   ALT 14 04/19/2019 1336   ALT 18 02/09/2017 1213   BILITOT 0.3 04/19/2019 1336   BILITOT 0.34 02/09/2017 1213      No results for input(s): INR in the last 168 hours.  Urinalysis    Component Value Date/Time   COLORURINE STRAW (A) 03/05/2016 1350    STUDIES:  No results found.   ASSESSMENT: 75 y.o. [place] woman with stage IV right breast cancer as of NOV 2007 (bone metastases, subcutaneous nodules)  (1) s/p Right mastectomy 1997 for an estrogen receptor and HER-2 positive breast cancer, treated with  (a)  adjuvant chemotherapy according to CALGB 9640 (taxotere x 4 cycles)  (b) high-dose chemotherapy (cyclophosphamide, carboplatin, BCNU) followed by stem cell rescue at [place] 12/22/1995  (c) biopsy-proven metastatic disease November 2007, estrogen receptor and HER-2 positive  (d) trastuzumab (dose? Duration?)  (e) tamoxifen for 5 years completed 2002  (f) participation in a vaccine study at [facility] 2003 (CEA primed dendritic cells, HER-2 primed dendritic cells)  (2) status post right iliac crest biopsy 10/10/2013 for invasive ductal carcinoma, grade 2, estrogen and progesterone receptor positive  (3) zoledronate started 10/12/2013,  repeated every 12 weeks  (a) dexa scan 07/28/2013 normal  (b) zolendronate discontinued after January 2019 dose  (4) biopsy of a scalp lesion 10/17/2013 showed metastatic breast cancer, HER-2/neu negative  (a) biopsy of a right anterior chest wall nodule August 2017 shows adenocarcinoma  (5) anastrozole started 10/28/2013, changed to letrozole 06/27/2016  (a) measurable disease = subcutaneous nodules in scalp and LLQ abdomen  (b)  CA-27-29 is informative  (c)  Repeat PET scan 12/28/2014 shows continuing response ; exam shows resolution of scalp lesions  (d) new skin lesion removed from right anterior chest wall August 2017  (e) status post radiation to the anterior lower right chest wall completed 02/25/2017 (20 sessions)   (f) PET scan 04/04/2016 shows 2 new foci of metabolic activity (T8 and manubrium)  (g) palbociclib added 04/21/2016 at 125 mg 21/7  (h) palbociclib dose dropped to 100 mg 21/7 starting with the March 2018 cycle  (i) bone scan 09/26/2016 is stable  (j) PET scan 02/03/2017 shows her bone metastases, and in addition a 0.5 cm right middle lobe nodule  which may be new  (k) MRI of the cervical spine shows collapse of the left lateral mass of C3.  There is no soft tissue mass or focal neural impingement.  (l) palbociclib reduced to 75 mg as of September  2018  (m) palbociclib held during cervical spine irradiation finished in mid February, 2019  (n) letrozole discontinued February 2019 secondary to likely progression   (6) neoplasia associated pain  (a) painful blastic lesion left femoral intertrochanteric region: Status post radiation completed November 2015  (b) pain left knee, status post TKR remotely  (c) left total hip replacement 03/12/2016  (7) palliative radiation to the cervical spine completed 05/08/2017 in [facility]  (8) fulvestrant started 05/04/2017  (a) Palbociclib resumed 06/29/2017, currently at 75 mg every other day, 21 days on 7 days off  (9) denosumab/Xgeva started 06/01/2017, repeated every 28 days  (10) follow-up/staging studies  (a) PET scan 08/11/2017 shows no evidence for metabolically active tumor.  Sclerotic bone mets do not show hypermetabolism  (b) PET scan 02/11/2018 shows continuing excellent tumor control  (c) PET scan 08/05/2018 shows same bone lesions but no active disease  (d) PET scan 01/18/2019 finds stable disease  PLAN: Akiah is now a little over 24 years out from definitive diagnosis of metastatic breast cancer.  Her disease is well controlled on her current treatments and she is tolerating those treatments well.  She is very appreciative of receiving the Ibrance through our oral chemotherapy pharmacy is specialist, which are providing it free of charge.  I think it would be prudent to obtain a chest x-ray given her history of cough and a little teeny bit of hemoptysis, though I think what she is describing is most likely to be allergy related  She will continue with Xgeva monthly fulvestrant monthly and palbociclib at 75 mg every other day for 21 days on and 7 days off and return to see me in 12 weeks.  She knows to call for any other issue that may develop before then  Total encounter time 30 minutes.*   Markeisha Mancias, Virgie Dad, MD  07/12/19 1:25 PM Medical Oncology and Hematology [facility] [place], [place] [address] Tel. [phone]    Fax. [phone]   I, Wilburn Mylar, am acting as scribe for Dr. Virgie Dad. Gerrett Loman.  I, Lurline Del MD, have reviewed the above documentation for accuracy and completeness, and I agree with the above.   *Total Encounter Time as defined by the Centers for Medicare and Medicaid Services includes, in addition to the face-to-face time of a patient visit (documented in the note above) non-face-to-face time: obtaining and reviewing outside history, ordering and reviewing medications, tests or procedures, care coordination (communications with other health care professionals or caregivers) and documentation in the medical record.

## 2019-07-12 ENCOUNTER — Inpatient Hospital Stay: Payer: Medicare Other | Attending: Oncology

## 2019-07-12 ENCOUNTER — Inpatient Hospital Stay (HOSPITAL_BASED_OUTPATIENT_CLINIC_OR_DEPARTMENT_OTHER): Payer: Medicare Other | Admitting: Oncology

## 2019-07-12 ENCOUNTER — Other Ambulatory Visit: Payer: Self-pay

## 2019-07-12 ENCOUNTER — Ambulatory Visit (HOSPITAL_COMMUNITY)
Admission: RE | Admit: 2019-07-12 | Discharge: 2019-07-12 | Disposition: A | Discharge status: Home / Self Care | Payer: Medicare Other | Source: Ambulatory Visit | Attending: Oncology | Admitting: Oncology

## 2019-07-12 ENCOUNTER — Inpatient Hospital Stay: Payer: Medicare Other

## 2019-07-12 VITALS — BP 165/90 | HR 54 | Temp 97.8°F | Resp 18 | Ht 63.0 in | Wt 170.4 lb

## 2019-07-12 DIAGNOSIS — C50919 Malignant neoplasm of unspecified site of unspecified female breast: Secondary | ICD-10-CM

## 2019-07-12 DIAGNOSIS — C50911 Malignant neoplasm of unspecified site of right female breast: Secondary | ICD-10-CM

## 2019-07-12 DIAGNOSIS — Z170 Estrogen receptor positive status [ER+]: Secondary | ICD-10-CM | POA: Insufficient documentation

## 2019-07-12 DIAGNOSIS — Z5111 Encounter for antineoplastic chemotherapy: Secondary | ICD-10-CM | POA: Insufficient documentation

## 2019-07-12 DIAGNOSIS — C50912 Malignant neoplasm of unspecified site of left female breast: Secondary | ICD-10-CM

## 2019-07-12 DIAGNOSIS — C50811 Malignant neoplasm of overlapping sites of right female breast: Secondary | ICD-10-CM | POA: Insufficient documentation

## 2019-07-12 DIAGNOSIS — C7951 Secondary malignant neoplasm of bone: Secondary | ICD-10-CM

## 2019-07-12 DIAGNOSIS — R05 Cough: Secondary | ICD-10-CM | POA: Diagnosis not present

## 2019-07-12 MED ORDER — DENOSUMAB 120 MG/1.7ML ~~LOC~~ SOLN
SUBCUTANEOUS | Status: AC
  Filled 2019-07-12: qty 1.7

## 2019-07-12 MED ORDER — FULVESTRANT 250 MG/5ML IM SOLN
500.0000 mg | Freq: Once | INTRAMUSCULAR | Status: AC
  Administered 2019-07-12: 500 mg via INTRAMUSCULAR

## 2019-07-12 MED ORDER — DENOSUMAB 120 MG/1.7ML ~~LOC~~ SOLN
120.0000 mg | Freq: Once | SUBCUTANEOUS | Status: AC
  Administered 2019-07-12: 120 mg via SUBCUTANEOUS

## 2019-07-12 NOTE — Progress Notes (Signed)
Per lab results from 06/14/2019 at [facility] patient had a calcium of 9.1. MD ok to proceed.

## 2019-07-13 ENCOUNTER — Telehealth: Payer: Self-pay | Admitting: Oncology

## 2019-07-13 ENCOUNTER — Other Ambulatory Visit: Payer: Self-pay | Admitting: Oncology

## 2019-07-13 LAB — CANCER ANTIGEN 27.29: CA 27.29: 32.8 U/mL (ref 0.0–38.6)

## 2019-07-13 NOTE — Telephone Encounter (Signed)
Scheduled appts per 4/20 los. Pt confirmed appt date and time.

## 2019-07-22 DIAGNOSIS — I10 Essential (primary) hypertension: Secondary | ICD-10-CM | POA: Diagnosis not present

## 2019-07-22 DIAGNOSIS — E039 Hypothyroidism, unspecified: Secondary | ICD-10-CM | POA: Diagnosis not present

## 2019-07-26 DIAGNOSIS — C50911 Malignant neoplasm of unspecified site of right female breast: Secondary | ICD-10-CM | POA: Diagnosis not present

## 2019-08-02 DIAGNOSIS — E538 Deficiency of other specified B group vitamins: Secondary | ICD-10-CM | POA: Diagnosis not present

## 2019-08-09 DIAGNOSIS — Z5111 Encounter for antineoplastic chemotherapy: Secondary | ICD-10-CM | POA: Diagnosis not present

## 2019-08-09 DIAGNOSIS — C7951 Secondary malignant neoplasm of bone: Secondary | ICD-10-CM | POA: Diagnosis not present

## 2019-08-09 DIAGNOSIS — C50919 Malignant neoplasm of unspecified site of unspecified female breast: Secondary | ICD-10-CM | POA: Diagnosis not present

## 2019-08-18 DIAGNOSIS — E782 Mixed hyperlipidemia: Secondary | ICD-10-CM | POA: Diagnosis not present

## 2019-08-19 DIAGNOSIS — M1711 Unilateral primary osteoarthritis, right knee: Secondary | ICD-10-CM | POA: Diagnosis not present

## 2019-08-19 DIAGNOSIS — M25561 Pain in right knee: Secondary | ICD-10-CM | POA: Diagnosis not present

## 2019-08-29 DIAGNOSIS — Z6829 Body mass index (BMI) 29.0-29.9, adult: Secondary | ICD-10-CM | POA: Diagnosis not present

## 2019-08-29 DIAGNOSIS — M79675 Pain in left toe(s): Secondary | ICD-10-CM | POA: Diagnosis not present

## 2019-09-05 DIAGNOSIS — E538 Deficiency of other specified B group vitamins: Secondary | ICD-10-CM | POA: Diagnosis not present

## 2019-09-21 DIAGNOSIS — K219 Gastro-esophageal reflux disease without esophagitis: Secondary | ICD-10-CM | POA: Diagnosis not present

## 2019-09-21 DIAGNOSIS — E785 Hyperlipidemia, unspecified: Secondary | ICD-10-CM | POA: Diagnosis not present

## 2019-09-21 DIAGNOSIS — I10 Essential (primary) hypertension: Secondary | ICD-10-CM | POA: Diagnosis not present

## 2019-09-21 DIAGNOSIS — E039 Hypothyroidism, unspecified: Secondary | ICD-10-CM | POA: Diagnosis not present

## 2019-10-13 ENCOUNTER — Inpatient Hospital Stay: Payer: Medicare Other

## 2019-10-13 ENCOUNTER — Other Ambulatory Visit: Payer: Self-pay

## 2019-10-13 ENCOUNTER — Inpatient Hospital Stay (HOSPITAL_BASED_OUTPATIENT_CLINIC_OR_DEPARTMENT_OTHER): Payer: Medicare Other | Admitting: Oncology

## 2019-10-13 ENCOUNTER — Inpatient Hospital Stay: Payer: Medicare Other | Attending: Oncology

## 2019-10-13 DIAGNOSIS — C50811 Malignant neoplasm of overlapping sites of right female breast: Secondary | ICD-10-CM | POA: Diagnosis not present

## 2019-10-13 DIAGNOSIS — C7951 Secondary malignant neoplasm of bone: Secondary | ICD-10-CM | POA: Diagnosis not present

## 2019-10-13 DIAGNOSIS — C50911 Malignant neoplasm of unspecified site of right female breast: Secondary | ICD-10-CM

## 2019-10-13 DIAGNOSIS — C50919 Malignant neoplasm of unspecified site of unspecified female breast: Secondary | ICD-10-CM | POA: Diagnosis not present

## 2019-10-13 DIAGNOSIS — G459 Transient cerebral ischemic attack, unspecified: Secondary | ICD-10-CM

## 2019-10-13 DIAGNOSIS — Z170 Estrogen receptor positive status [ER+]: Secondary | ICD-10-CM

## 2019-10-13 DIAGNOSIS — C50912 Malignant neoplasm of unspecified site of left female breast: Secondary | ICD-10-CM

## 2019-10-13 DIAGNOSIS — Z7189 Other specified counseling: Secondary | ICD-10-CM

## 2019-10-13 LAB — COMPREHENSIVE METABOLIC PANEL
ALT: 17 U/L (ref 0–44)
AST: 21 U/L (ref 15–41)
Albumin: 4.2 g/dL (ref 3.5–5.0)
Alkaline Phosphatase: 63 U/L (ref 38–126)
Anion gap: 12 (ref 5–15)
BUN: 17 mg/dL (ref 8–23)
CO2: 25 mmol/L (ref 22–32)
Calcium: 9.4 mg/dL (ref 8.9–10.3)
Chloride: 100 mmol/L (ref 98–111)
Creatinine, Ser: 1.03 mg/dL — ABNORMAL HIGH (ref 0.44–1.00)
GFR calc Af Amer: >60 mL/min (ref >60)
GFR calc non Af Amer: 54 mL/min — ABNORMAL LOW (ref >60)
Glucose, Bld: 116 mg/dL — ABNORMAL HIGH (ref 70–99)
Potassium: 4.5 mmol/L (ref 3.5–5.1)
Sodium: 137 mmol/L (ref 135–145)
Total Bilirubin: 0.6 mg/dL (ref 0.3–1.2)
Total Protein: 6.8 g/dL (ref 6.5–8.1)

## 2019-10-13 LAB — CBC WITH DIFFERENTIAL/PLATELET
Abs Immature Granulocytes: 0.08 10*3/uL — ABNORMAL HIGH (ref 0.00–0.07)
Basophils Absolute: 0.1 10*3/uL (ref 0.0–0.1)
Basophils Relative: 1 %
Eosinophils Absolute: 0.1 10*3/uL (ref 0.0–0.5)
Eosinophils Relative: 2 %
HCT: 40.6 % (ref 36.0–46.0)
Hemoglobin: 13.6 g/dL (ref 12.0–15.0)
Immature Granulocytes: 2 %
Lymphocytes Relative: 16 %
Lymphs Abs: 0.6 10*3/uL — ABNORMAL LOW (ref 0.7–4.0)
MCH: 30.6 pg (ref 26.0–34.0)
MCHC: 33.5 g/dL (ref 30.0–36.0)
MCV: 91.2 fL (ref 80.0–100.0)
Monocytes Absolute: 0.4 10*3/uL (ref 0.1–1.0)
Monocytes Relative: 10 %
Neutro Abs: 2.8 10*3/uL (ref 1.7–7.7)
Neutrophils Relative %: 69 %
Platelets: 203 10*3/uL (ref 150–400)
RBC: 4.45 MIL/uL (ref 3.87–5.11)
RDW: 14.9 % (ref 11.5–15.5)
WBC: 4 10*3/uL (ref 4.0–10.5)
nRBC: 0 % (ref 0.0–0.2)

## 2019-10-13 MED ORDER — DENOSUMAB 120 MG/1.7ML ~~LOC~~ SOLN
120.0000 mg | Freq: Once | SUBCUTANEOUS | Status: AC
  Administered 2019-10-13: 120 mg via SUBCUTANEOUS

## 2019-10-13 MED ORDER — FULVESTRANT 250 MG/5ML IM SOLN
INTRAMUSCULAR | Status: AC
  Filled 2019-10-13: qty 10

## 2019-10-13 MED ORDER — FULVESTRANT 250 MG/5ML IM SOLN
500.0000 mg | Freq: Once | INTRAMUSCULAR | Status: AC
  Administered 2019-10-13: 500 mg via INTRAMUSCULAR

## 2019-10-13 MED ORDER — DENOSUMAB 120 MG/1.7ML ~~LOC~~ SOLN
SUBCUTANEOUS | Status: AC
  Filled 2019-10-13: qty 1.7

## 2019-10-13 NOTE — Patient Instructions (Signed)
Fulvestrant injection What is this medicine? FULVESTRANT (ful VES trant) blocks the effects of estrogen. It is used to treat breast cancer. This medicine may be used for other purposes; ask your health care provider or pharmacist if you have questions. COMMON BRAND NAME(S): FASLODEX What should I tell my health care provider before I take this medicine? They need to know if you have any of these conditions:  bleeding disorders  liver disease  low blood counts, like low white cell, platelet, or red cell counts  an unusual or allergic reaction to fulvestrant, other medicines, foods, dyes, or preservatives  pregnant or trying to get pregnant  breast-feeding How should I use this medicine? This medicine is for injection into a muscle. It is usually given by a health care professional in a hospital or clinic setting. Talk to your pediatrician regarding the use of this medicine in children. Special care may be needed. Overdosage: If you think you have taken too much of this medicine contact a poison control center or emergency room at once. NOTE: This medicine is only for you. Do not share this medicine with others. What if I miss a dose? It is important not to miss your dose. Call your doctor or health care professional if you are unable to keep an appointment. What may interact with this medicine?  medicines that treat or prevent blood clots like warfarin, enoxaparin, dalteparin, apixaban, dabigatran, and rivaroxaban This list may not describe all possible interactions. Give your health care provider a list of all the medicines, herbs, non-prescription drugs, or dietary supplements you use. Also tell them if you smoke, drink alcohol, or use illegal drugs. Some items may interact with your medicine. What should I watch for while using this medicine? Your condition will be monitored carefully while you are receiving this medicine. You will need important blood work done while you are taking  this medicine. Do not become pregnant while taking this medicine or for at least 1 year after stopping it. Women of child-bearing potential will need to have a negative pregnancy test before starting this medicine. Women should inform their doctor if they wish to become pregnant or think they might be pregnant. There is a potential for serious side effects to an unborn child. Men should inform their doctors if they wish to father a child. This medicine may lower sperm counts. Talk to your health care professional or pharmacist for more information. Do not breast-feed an infant while taking this medicine or for 1 year after the last dose. What side effects may I notice from receiving this medicine? Side effects that you should report to your doctor or health care professional as soon as possible:  allergic reactions like skin rash, itching or hives, swelling of the face, lips, or tongue  feeling faint or lightheaded, falls  pain, tingling, numbness, or weakness in the legs  signs and symptoms of infection like fever or chills; cough; flu-like symptoms; sore throat  vaginal bleeding Side effects that usually do not require medical attention (report to your doctor or health care professional if they continue or are bothersome):  aches, pains  constipation  diarrhea  headache  hot flashes  nausea, vomiting  pain at site where injected  stomach pain This list may not describe all possible side effects. Call your doctor for medical advice about side effects. You may report side effects to FDA at 1-800-FDA-1088. Where should I keep my medicine? This drug is given in a hospital or clinic and will  not be stored at home. NOTE: This sheet is a summary. It may not cover all possible information. If you have questions about this medicine, talk to your doctor, pharmacist, or health care provider.  2020 [employer] Standard (2017-06-18 11:34:41) Denosumab injection What is this  medicine? DENOSUMAB (den oh sue mab) slows bone breakdown. Prolia is used to treat osteoporosis in women after menopause and in men, and in people who are taking corticosteroids for 6 months or more. Delton See is used to treat a high calcium level due to cancer and to prevent bone fractures and other bone problems caused by multiple myeloma or cancer bone metastases. Delton See is also used to treat giant cell tumor of the bone. This medicine may be used for other purposes; ask your health care provider or pharmacist if you have questions. COMMON BRAND NAME(S): Prolia, XGEVA What should I tell my health care provider before I take this medicine? They need to know if you have any of these conditions:  dental disease  having surgery or tooth extraction  infection  kidney disease  low levels of calcium or Vitamin D in the blood  malnutrition  on hemodialysis  skin conditions or sensitivity  thyroid or parathyroid disease  an unusual reaction to denosumab, other medicines, foods, dyes, or preservatives  pregnant or trying to get pregnant  breast-feeding How should I use this medicine? This medicine is for injection under the skin. It is given by a health care professional in a hospital or clinic setting. A special MedGuide will be given to you before each treatment. Be sure to read this information carefully each time. For Prolia, talk to your pediatrician regarding the use of this medicine in children. Special care may be needed. For Delton See, talk to your pediatrician regarding the use of this medicine in children. While this drug may be prescribed for children as young as 13 years for selected conditions, precautions do apply. Overdosage: If you think you have taken too much of this medicine contact a poison control center or emergency room at once. NOTE: This medicine is only for you. Do not share this medicine with others. What if I miss a dose? It is important not to miss your dose. Call  your doctor or health care professional if you are unable to keep an appointment. What may interact with this medicine? Do not take this medicine with any of the following medications:  other medicines containing denosumab This medicine may also interact with the following medications:  medicines that lower your chance of fighting infection  steroid medicines like prednisone or cortisone This list may not describe all possible interactions. Give your health care provider a list of all the medicines, herbs, non-prescription drugs, or dietary supplements you use. Also tell them if you smoke, drink alcohol, or use illegal drugs. Some items may interact with your medicine. What should I watch for while using this medicine? Visit your doctor or health care professional for regular checks on your progress. Your doctor or health care professional may order blood tests and other tests to see how you are doing. Call your doctor or health care professional for advice if you get a fever, chills or sore throat, or other symptoms of a cold or flu. Do not treat yourself. This drug may decrease your body's ability to fight infection. Try to avoid being around people who are sick. You should make sure you get enough calcium and vitamin D while you are taking this medicine, unless your doctor tells  you not to. Discuss the foods you eat and the vitamins you take with your health care professional. See your dentist regularly. Brush and floss your teeth as directed. Before you have any dental work done, tell your dentist you are receiving this medicine. Do not become pregnant while taking this medicine or for 5 months after stopping it. Talk with your doctor or health care professional about your birth control options while taking this medicine. Women should inform their doctor if they wish to become pregnant or think they might be pregnant. There is a potential for serious side effects to an unborn child. Talk to your  health care professional or pharmacist for more information. What side effects may I notice from receiving this medicine? Side effects that you should report to your doctor or health care professional as soon as possible:  allergic reactions like skin rash, itching or hives, swelling of the face, lips, or tongue  bone pain  breathing problems  dizziness  jaw pain, especially after dental work  redness, blistering, peeling of the skin  signs and symptoms of infection like fever or chills; cough; sore throat; pain or trouble passing urine  signs of low calcium like fast heartbeat, muscle cramps or muscle pain; pain, tingling, numbness in the hands or feet; seizures  unusual bleeding or bruising  unusually weak or tired Side effects that usually do not require medical attention (report to your doctor or health care professional if they continue or are bothersome):  constipation  diarrhea  headache  joint pain  loss of appetite  muscle pain  runny nose  tiredness  upset stomach This list may not describe all possible side effects. Call your doctor for medical advice about side effects. You may report side effects to FDA at 1-800-FDA-1088. Where should I keep my medicine? This medicine is only given in a clinic, doctor's office, or other health care setting and will not be stored at home. NOTE: This sheet is a summary. It may not cover all possible information. If you have questions about this medicine, talk to your doctor, pharmacist, or health care provider.  2020 [employer] Standard (2017-07-17 16:10:44)  

## 2019-10-13 NOTE — Progress Notes (Signed)
Jade Mooney  [phone] [phone]   ID: Jade Mooney DOB: [birth year]  MR#: [id number]  XBM#:[id number]  Patient Care Team: Street, Sharon Mt, MD as PCP - General (Family Medicine) Clarene Essex, MD as Consulting Physician (Gastroenterology) Danie Hannig, Virgie Dad, MD as Consulting Physician (Oncology) Gaynelle Arabian, MD as Consulting Physician (Orthopedic Surgery) Gatha Mayer, MD as Consulting Physician (Radiation Oncology) Kristeen Miss, MD as Consulting Physician (Neurosurgery) Derwood Kaplan, MD as Consulting Physician (Oncology)   CHIEF COMPLAINT: metastatic breast cancer (s/p right mastectomy)  CURRENT TREATMENT: fulvestrant, denosumab/Xgeva, palbociclib   INTERVAL HISTORY: Jade Mooney returns today for follow-up and treatment of her estrogen receptor positive breast cancer accompanied by her husband [name].   She continues on palbociclib/Ibrance at 75 mg every other day, 21 days on 7 days off.  She is doing very well on this dose, with no diarrhea, rash, or other side effects.  She also continues on fulvestrant, which she receives today and every 28 days.  Aside from the discomfort of the administration of this medication she tolerates it fine.  Finally, she also continues on denosumab/Xgeva.  She receives that also every 28 days and again 2 out of every 3 doses she receives in [place] and every third dose including today's she receives it here.  She is being very careful to maintain excellent dental care and to see her dentist regularly.  We are following her CA-27-29, which normalized at the most recent determination Lab Results  Component Value Date   CA2729 32.8 07/12/2019   CA2729 37.8 04/19/2019   CA2729 37.1 01/25/2019   CA2729 40.5 (H) 11/02/2018   CA2729 42.3 (H) 08/10/2018    REVIEW OF SYSTEMS: Jade Mooney feels fine and is doing remarkably well.  She is normally active for her age.  She does have bursitis she tells me  and some sciatica on the right side.  She tries to remain as active as possible.  There have been no unusual headaches visual changes cough phlegm production pleurisy shortness of breath or other symptoms developing since the last visit.  She and [name] have both had their Pfizer COVID-19 vaccines.  A detailed review of systems was otherwise stable   BREAST CANCER HISTORY: From the earlier summary notes:  [name] was referred to Dr. Clarene Essex for evaluation of abdominal discomfort and nausea. Exam and blood work including liver function tests was unremarkable aside from normochromic normocytic anemia. Cholecystitis was suspected and the patient underwent endoscopy followed by abdominal/ pelvic CT scan on 09/29/2013. This showed showed a normal gallbladder. Incidental findings included multiple simple cysts in the liver and aortic atherosclerosis. However mixed lytic and sclerotic bony lesions were noted. On 10/10/2013 the patient underwent biopsy of the right iliac bone, and this showed ([id number]) metastatic invasive ductal carcinoma, grade 2, estrogen and progesterone receptor strongly positive.  [name] has a remote history of breast cancer, which we tried to reconstruct. She was originally diagnosed early in 1997 with stage III disease, and underwent right mastectomy followed by adjuvant chemotherapy. She then participated in a [facility] protocol with high-dose chemotherapy followed by stem cell rescue 12/22/1995. Unfortunately later that year liver lesions were noted and were biopsy-proven to be metastatic breast cancer. This was not felt to be resectable. However the tumor was estrogen receptor positive and HER-2 positive. The patient was treated with Herceptin (she does not know for what period of time) and was then on tamoxifen until November of 2002. She also participated in a vaccine study  at [facility].  Jade Mooney's subsequent history is as detailed below   PAST MEDICAL HISTORY: Past Medical History:    Diagnosis Date  . Anxiety   . Cancer [facility])    Breast 1997 right tx with mastectomy and chemo, metastatic now  . GERD (gastroesophageal reflux disease)   . Hypercholesterolemia   . Hypertension   . Hypothyroidism   . Osteoarthritis    oa  . Personal history of chemotherapy   . Personal history of radiation therapy   . TIA (transient ischemic attack) last 11-08-15   x 2 total    PAST SURGICAL HISTORY: Past Surgical History:  Procedure Laterality Date  . CATARACT EXTRACTION, BILATERAL    . MASTECTOMY MODIFIED RADICAL Right 1997  . porta cath insertion  1997   later removed  . radiation tx  finished 02-25-16   x 20 tx  . TONSILLECTOMY    . TOTAL HIP ARTHROPLASTY Left 03/12/2016   Procedure: LEFT TOTAL HIP ARTHROPLASTY ANTERIOR APPROACH;  Surgeon: Gaynelle Arabian, MD;  Location: [facility] ORS;  Service: Orthopedics;  Laterality: Left;  . TOTAL KNEE ARTHROPLASTY Left   . TUBAL LIGATION      FAMILY HISTORY: Family History  Problem Relation Age of Onset  . Breast cancer Maternal Aunt    The patient's father died at the age of 9 from heart disease. The patient's mother died at the age of 23. The patient had no brothers, one sister. One of the patient's mother's 5 sisters was diagnosed with breast cancer in her 34s   GYNECOLOGIC HISTORY:  No LMP recorded. Patient is postmenopausal. Menarche age 74, the patient is GX P0. She stopped having periods approximately 1994. She used hormone replacement until 1997. 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 Her husband Jade Mooney (goes by [name]") was [employer]. of information all services at [employer]. They live alone, with no pets.   ADVANCED DIRECTIVES: In place   HEALTH MAINTENANCE: Social History   Tobacco Use  . Smoking status: Never Smoker  . Smokeless tobacco: Never Used  Substance Use Topics  . Alcohol use: Yes    Comment: 1  glass wine 4-5 x week  . Drug use: No    Colonoscopy: 2013  PAP: 2010  Bone density: 2012  Lipid panel:  Allergies  Allergen Reactions  . Amoxicillin Hives    Has patient had a PCN reaction causing immediate rash, facial/tongue/throat swelling, SOB or lightheadedness with hypotension:unsure Has patient had a PCN reaction causing severe rash involving mucus membranes or skin necrosis:No Has patient had a PCN reaction that required hospitalization:No Has patient had a PCN reaction occurring within the last 10 years: Yes If all of the above answers are "NO", then may proceed with Cephalosporin use.   Marland Kitchen Percocet [Oxycodone-Acetaminophen] Nausea And Vomiting  . Percodan [Oxycodone-Aspirin] Nausea And Vomiting  . Tramadol Itching    Current Outpatient Medications  Medication Sig Dispense Refill  . acetaminophen (TYLENOL) 500 MG tablet Take 1,000 mg by mouth every 6 (six) hours as needed for mild pain. Reported on 04/17/2015    . calcium carbonate (OSCAL) 1500 (600 Ca) MG TABS tablet Take 600 mg of elemental calcium by mouth 2 (two) times daily with a meal.    . cholecalciferol (VITAMIN D) 1000 units tablet Take 1,000 Units by mouth daily.    . clopidogrel (PLAVIX) 75 MG tablet Take 1 tablet (75 mg  total) by mouth daily.    . cyanocobalamin (,VITAMIN B-12,) 1000 MCG/ML injection Inject 1,000 mcg into the muscle once.    . famotidine (PEPCID) 40 MG tablet Take 1 tablet (40 mg total) by mouth 2 (two) times daily.    Marland Kitchen levothyroxine (SYNTHROID, LEVOTHROID) 88 MCG tablet Take 88 mcg by mouth daily before breakfast.    . Lutein 6 MG TABS Take 6 mg by mouth 2 (two) times daily.     . magnesium gluconate (MAGONATE) 500 MG tablet Take 500 mg by mouth at bedtime.    . meclizine (ANTIVERT) 25 MG tablet Take 25 mg by mouth 3 (three) times daily as needed for dizziness.    Marland Kitchen METRONIDAZOLE, TOPICAL, 0.75 % LOTN Apply 1 application topically at bedtime. Apply to face for rosacea    . Omega-3 Fatty Acids  (FISH OIL) 500 MG CAPS Take by mouth.  0  . palbociclib (IBRANCE) 75 MG capsule Take 1 capsule (75 mg total) by mouth daily with breakfast. Take whole with food on days 1-21, every 28 days. 21 capsule 6  . pantoprazole (PROTONIX) 40 MG tablet Take 40 mg by mouth 2 (two) times daily. Before breakfast and before supper    . Polyethyl Glycol-Propyl Glycol (SYSTANE ULTRA) 0.4-0.3 % SOLN Place 1-2 drops into both eyes 2 (two) times daily.    . polyethylene glycol (MIRALAX / GLYCOLAX) packet Take 4 g by mouth daily. 1 teaspoonful in the morning    . rOPINIRole (REQUIP) 0.5 MG tablet Take 0.5 mg by mouth 3 (three) times daily.    . rosuvastatin (CRESTOR) 20 MG tablet Take 1 tablet (20 mg total) by mouth daily.    . traZODone (DESYREL) 50 MG tablet Take 25 mg by mouth at bedtime.      No current facility-administered medications for this visit.    OBJECTIVE: white woman in no acute distress  There were no vitals filed for this visit.   There is no height or weight on file to calculate BMI.    ECOG FS:1 - Symptomatic but completely ambulatory  Sclerae unicteric, EOMs intact Wearing a mask No cervical or supraclavicular adenopathy Lungs no rales or rhonchi Heart regular rate and rhythm Abd soft, nontender, positive bowel sounds MSK no focal spinal tenderness, no upper extremity lymphedema Neuro: nonfocal, well oriented, appropriate affect Breasts: The right breast is status post mastectomy with no evidence of chest wall recurrence.  There is an area of telangiectasia at the boost site which is unchanged.  The left breast is benign as are both axillae   LAB RESULTS:  CMP     Component Value Date/Time   NA 137 10/13/2019 1201   NA 138 02/09/2017 1213   K 4.5 10/13/2019 1201   K 3.9 02/09/2017 1213   CL 100 10/13/2019 1201   CO2 25 10/13/2019 1201   CO2 27 02/09/2017 1213   GLUCOSE 116 (H) 10/13/2019 1201   GLUCOSE 100 02/09/2017 1213   BUN 17 10/13/2019 1201   BUN 12.0 02/09/2017 1213     CREATININE 1.03 (H) 10/13/2019 1201   CREATININE 0.9 02/09/2017 1213   CALCIUM 9.4 10/13/2019 1201   CALCIUM 8.7 02/09/2017 1213   PROT 6.8 10/13/2019 1201   PROT 6.5 02/09/2017 1213   ALBUMIN 4.2 10/13/2019 1201   ALBUMIN 4.1 02/09/2017 1213   AST 21 10/13/2019 1201   AST 24 02/09/2017 1213   ALT 17 10/13/2019 1201   ALT 18 02/09/2017 1213   ALKPHOS 63 10/13/2019 1201  ALKPHOS 62 02/09/2017 1213   BILITOT 0.6 10/13/2019 1201   BILITOT 0.34 02/09/2017 1213   GFRNONAA 54 (L) 10/13/2019 1201   GFRAA >60 10/13/2019 1201   No results found for: SPEP  Lab Results  Component Value Date   WBC 4.0 10/13/2019   NEUTROABS 2.8 10/13/2019   HGB 13.6 10/13/2019   HCT 40.6 10/13/2019   MCV 91.2 10/13/2019   PLT 203 10/13/2019      Chemistry      Component Value Date/Time   NA 137 10/13/2019 1201   NA 138 02/09/2017 1213   K 4.5 10/13/2019 1201   K 3.9 02/09/2017 1213   CL 100 10/13/2019 1201   CO2 25 10/13/2019 1201   CO2 27 02/09/2017 1213   BUN 17 10/13/2019 1201   BUN 12.0 02/09/2017 1213   CREATININE 1.03 (H) 10/13/2019 1201   CREATININE 0.9 02/09/2017 1213      Component Value Date/Time   CALCIUM 9.4 10/13/2019 1201   CALCIUM 8.7 02/09/2017 1213   ALKPHOS 63 10/13/2019 1201   ALKPHOS 62 02/09/2017 1213   AST 21 10/13/2019 1201   AST 24 02/09/2017 1213   ALT 17 10/13/2019 1201   ALT 18 02/09/2017 1213   BILITOT 0.6 10/13/2019 1201   BILITOT 0.34 02/09/2017 1213      No results for input(s): INR in the last 168 hours.  Urinalysis    Component Value Date/Time   COLORURINE STRAW (A) 03/05/2016 1350    STUDIES:  No results found.   ASSESSMENT: 74 y.o. [place] woman with stage IV right breast cancer as of NOV 2007 (bone metastases, subcutaneous nodules)  (1) s/p Right mastectomy 1997 for an estrogen receptor and HER-2 positive breast cancer, treated with  (a) adjuvant chemotherapy according to CALGB 9640 (taxotere x 4 cycles)  (b) high-dose  chemotherapy (cyclophosphamide, carboplatin, BCNU) followed by stem cell rescue at [facility] 12/22/1995  (c) biopsy-proven metastatic disease November 2007, estrogen receptor and HER-2 positive  (d) trastuzumab (dose? Duration?)  (e) tamoxifen for 5 years completed 2002  (f) participation in a vaccine study at [facility] 2003 (CEA primed dendritic cells, HER-2 primed dendritic cells)  (2) status post right iliac crest biopsy 10/10/2013 for invasive ductal carcinoma, grade 2, estrogen and progesterone receptor positive  (3) zoledronate started 10/12/2013,  repeated every 12 weeks  (a) dexa scan 07/28/2013 normal  (b) zolendronate discontinued after January 2019 dose  (4) biopsy of a scalp lesion 10/17/2013 showed metastatic breast cancer, HER-2/neu negative  (a) biopsy of a right anterior chest wall nodule August 2017 shows adenocarcinoma  (5) anastrozole started 10/28/2013, changed to letrozole 06/27/2016  (a) measurable disease = subcutaneous nodules in scalp and LLQ abdomen  (b)  CA-27-29 is informative  (c)  Repeat PET scan 12/28/2014 shows continuing response ; exam shows resolution of scalp lesions  (d) new skin lesion removed from right anterior chest wall August 2017  (e) status post radiation to the anterior lower right chest wall completed 02/25/2017 (20 sessions)   (f) PET scan 04/04/2016 shows 2 new foci of metabolic activity (T8 and manubrium)  (g) palbociclib added 04/21/2016 at 125 mg 21/7  (h) palbociclib dose dropped to 100 mg 21/7 starting with the March 2018 cycle  (i) bone scan 09/26/2016 is stable  (j) PET scan 02/03/2017 shows her bone metastases, and in addition a 0.5 cm right middle lobe nodule which may be new  (k) MRI of the cervical spine shows collapse of the left lateral mass of C3.  There is no soft tissue mass or focal neural impingement.  (l) palbociclib reduced to 75 mg as of September 2018  (m) palbociclib held during cervical spine irradiation finished in mid  February, 2019  (n) letrozole discontinued February 2019 secondary to likely progression   (6) neoplasia associated pain  (a) painful blastic lesion left femoral intertrochanteric region: Status post radiation completed November 2015  (b) pain left knee, status post TKR remotely  (c) left total hip replacement 03/12/2016  (7) palliative radiation to the cervical spine completed 05/08/2017 in [place]  (8) fulvestrant started 05/04/2017  (a) Palbociclib resumed 06/29/2017, currently at 75 mg every other day, 21 days on 7 days off  (9) denosumab/Xgeva started 06/01/2017, repeated every 28 days  (10) follow-up/staging studies  (a) PET scan 08/11/2017 shows no evidence for metabolically active tumor.  Sclerotic bone mets do not show hypermetabolism  (b) PET scan 02/11/2018 shows continuing excellent tumor control  (c) PET scan 08/05/2018 shows same bone lesions but no active disease  (d) PET scan 01/18/2019 finds stable disease   PLAN: Joniya is now about 24-year out from her initial diagnosis of metastatic breast cancer.  Her acute disease continues to be well controlled on her current treatment.  She is tolerating the palbociclib, fulvestrant and denosumab/Xgeva remarkably well.  She will continue to receive treatments locally for the next 2 months and then in 3 months she will return to see me.  Before that visit she will have a restaging PET scan.  I encouraged her to go ahead and get her shingles vaccine (the Shingrix) perhaps early August bothered by her which time she will have been off the prednisone for at least a couple of weeks  I have encouraged her to call us with any other issues that may develop before the next visit  Total encounter time 20 minutes.*   Talissa Apple, Virgie Dad, MD  10/13/19 1:25 PM Medical Oncology and Hematology [facility] [address], [place] [address] Tel. [phone]    Fax. [phone]   I, Wilburn Mylar, am acting as  scribe for Dr. Virgie Dad. Josceline Chenard.  I, Lurline Del MD, have reviewed the above documentation for accuracy and completeness, and I agree with the above.   *Total Encounter Time as defined by the Centers for Medicare and Medicaid Services includes, in addition to the face-to-face time of a patient visit (documented in the note above) non-face-to-face time: obtaining and reviewing outside history, ordering and reviewing medications, tests or procedures, care coordination (communications with other health care professionals or caregivers) and documentation in the medical record.

## 2019-10-14 LAB — CANCER ANTIGEN 27.29: CA 27.29: 43.1 U/mL — ABNORMAL HIGH (ref 0.0–38.6)

## 2019-10-18 ENCOUNTER — Telehealth: Payer: Self-pay | Admitting: Pharmacist

## 2019-10-18 NOTE — Telephone Encounter (Signed)
Oral Chemotherapy Pharmacist Encounter  Dispensed samples to patient:   Medication: Ibrance 75 mg tablets Instructions: Take 1 tablet by mouth daily with breakfast days 1-21 every 28 days Quantity dispensed: 63 Days supply: 84 Manufacturer: [employer] Lot: [id number] Exp: 08/2020  Patient will pick up samples from clinic on 10/19/2019.  Leron Croak, PharmD, BCPS Hematology/Oncology Clinical Pharmacist [facility] [phone] 10/18/2019 3:25 PM

## 2019-10-22 DIAGNOSIS — I10 Essential (primary) hypertension: Secondary | ICD-10-CM | POA: Diagnosis not present

## 2019-10-22 DIAGNOSIS — K219 Gastro-esophageal reflux disease without esophagitis: Secondary | ICD-10-CM | POA: Diagnosis not present

## 2019-10-22 DIAGNOSIS — E785 Hyperlipidemia, unspecified: Secondary | ICD-10-CM | POA: Diagnosis not present

## 2019-10-22 DIAGNOSIS — E039 Hypothyroidism, unspecified: Secondary | ICD-10-CM | POA: Diagnosis not present

## 2019-10-27 DIAGNOSIS — C50911 Malignant neoplasm of unspecified site of right female breast: Secondary | ICD-10-CM | POA: Diagnosis not present

## 2019-11-04 DIAGNOSIS — E538 Deficiency of other specified B group vitamins: Secondary | ICD-10-CM | POA: Diagnosis not present

## 2019-11-11 DIAGNOSIS — Z5111 Encounter for antineoplastic chemotherapy: Secondary | ICD-10-CM | POA: Diagnosis not present

## 2019-11-11 DIAGNOSIS — C7951 Secondary malignant neoplasm of bone: Secondary | ICD-10-CM | POA: Diagnosis not present

## 2019-11-11 DIAGNOSIS — C50919 Malignant neoplasm of unspecified site of unspecified female breast: Secondary | ICD-10-CM | POA: Diagnosis not present

## 2019-11-14 DIAGNOSIS — Z23 Encounter for immunization: Secondary | ICD-10-CM | POA: Diagnosis not present

## 2019-11-22 DIAGNOSIS — E039 Hypothyroidism, unspecified: Secondary | ICD-10-CM | POA: Diagnosis not present

## 2019-11-22 DIAGNOSIS — I10 Essential (primary) hypertension: Secondary | ICD-10-CM | POA: Diagnosis not present

## 2019-11-22 DIAGNOSIS — E785 Hyperlipidemia, unspecified: Secondary | ICD-10-CM | POA: Diagnosis not present

## 2019-11-22 DIAGNOSIS — K219 Gastro-esophageal reflux disease without esophagitis: Secondary | ICD-10-CM | POA: Diagnosis not present

## 2019-12-09 DIAGNOSIS — C7951 Secondary malignant neoplasm of bone: Secondary | ICD-10-CM | POA: Diagnosis not present

## 2019-12-09 DIAGNOSIS — Z5111 Encounter for antineoplastic chemotherapy: Secondary | ICD-10-CM | POA: Diagnosis not present

## 2019-12-09 DIAGNOSIS — C50919 Malignant neoplasm of unspecified site of unspecified female breast: Secondary | ICD-10-CM | POA: Diagnosis not present

## 2019-12-14 DIAGNOSIS — E538 Deficiency of other specified B group vitamins: Secondary | ICD-10-CM | POA: Diagnosis not present

## 2019-12-22 DIAGNOSIS — K219 Gastro-esophageal reflux disease without esophagitis: Secondary | ICD-10-CM | POA: Diagnosis not present

## 2019-12-22 DIAGNOSIS — E785 Hyperlipidemia, unspecified: Secondary | ICD-10-CM | POA: Diagnosis not present

## 2019-12-22 DIAGNOSIS — E039 Hypothyroidism, unspecified: Secondary | ICD-10-CM | POA: Diagnosis not present

## 2019-12-22 DIAGNOSIS — I10 Essential (primary) hypertension: Secondary | ICD-10-CM | POA: Diagnosis not present

## 2019-12-27 DIAGNOSIS — Z23 Encounter for immunization: Secondary | ICD-10-CM | POA: Diagnosis not present

## 2020-01-03 ENCOUNTER — Ambulatory Visit (HOSPITAL_COMMUNITY)
Admission: RE | Admit: 2020-01-03 | Discharge: 2020-01-03 | Disposition: A | Discharge status: Home / Self Care | Payer: Medicare Other | Source: Ambulatory Visit | Attending: Oncology | Admitting: Oncology

## 2020-01-03 ENCOUNTER — Other Ambulatory Visit: Payer: Self-pay

## 2020-01-03 DIAGNOSIS — Z170 Estrogen receptor positive status [ER+]: Secondary | ICD-10-CM | POA: Diagnosis not present

## 2020-01-03 DIAGNOSIS — G459 Transient cerebral ischemic attack, unspecified: Secondary | ICD-10-CM

## 2020-01-03 DIAGNOSIS — C50811 Malignant neoplasm of overlapping sites of right female breast: Secondary | ICD-10-CM

## 2020-01-03 DIAGNOSIS — K8020 Calculus of gallbladder without cholecystitis without obstruction: Secondary | ICD-10-CM | POA: Diagnosis not present

## 2020-01-03 DIAGNOSIS — Z7189 Other specified counseling: Secondary | ICD-10-CM | POA: Diagnosis not present

## 2020-01-03 DIAGNOSIS — I700 Atherosclerosis of aorta: Secondary | ICD-10-CM | POA: Insufficient documentation

## 2020-01-03 DIAGNOSIS — C50919 Malignant neoplasm of unspecified site of unspecified female breast: Secondary | ICD-10-CM

## 2020-01-03 DIAGNOSIS — I2510 Atherosclerotic heart disease of native coronary artery without angina pectoris: Secondary | ICD-10-CM | POA: Diagnosis not present

## 2020-01-03 DIAGNOSIS — C7951 Secondary malignant neoplasm of bone: Secondary | ICD-10-CM | POA: Insufficient documentation

## 2020-01-03 LAB — GLUCOSE, CAPILLARY: Glucose-Capillary: 116 mg/dL — ABNORMAL HIGH (ref 70–99)

## 2020-01-03 MED ORDER — FLUDEOXYGLUCOSE F - 18 (FDG) INJECTION
8.4300 | Freq: Once | INTRAVENOUS | Status: AC
  Administered 2020-01-03: 8.43 via INTRAVENOUS

## 2020-01-04 DIAGNOSIS — M1711 Unilateral primary osteoarthritis, right knee: Secondary | ICD-10-CM | POA: Diagnosis not present

## 2020-01-04 NOTE — Progress Notes (Signed)
[facility]  [phone] Fax:([phone]) [phone]   ID: Shelvia Fojtik DOB: 1946-03-17  MR#: [id number]  DGU#:[id number]  Patient Care Team: Street, Sharon Mt, MD as PCP - General (Family Medicine) Clarene Essex, MD as Consulting Physician (Gastroenterology) Betheny Suchecki, Virgie Dad, MD as Consulting Physician (Oncology) Gaynelle Arabian, MD as Consulting Physician (Orthopedic Surgery) Gatha Mayer, MD as Consulting Physician (Radiation Oncology) Kristeen Miss, MD as Consulting Physician (Neurosurgery) Derwood Kaplan, MD as Consulting Physician (Oncology)   CHIEF COMPLAINT: metastatic breast cancer (s/p right mastectomy)  CURRENT TREATMENT: fulvestrant, denosumab/Xgeva, palbociclib   INTERVAL HISTORY: Alesandra returns today for follow-up and treatment of her estrogen receptor positive breast cancer accompanied by her husband [name].   Since her last visit, she underwent restaging PET scan on 01/03/2020 showing: no abnormal FDG uptake identified to suggest residual or recurrent metabolically active disease; widespread sclerotic bone masses without corresponding hypermetabolism, favored to represent treated bone metastases.  She continues on palbociclib/Ibrance at 75 mg every other day, 21 days on 7 days off.  She has a little bit of nausea sometimes in the morning which she thinks might be related to this medication.  She has no other side effects from it.  She also continues on fulvestrant, which she receives today and every 28 days.  Aside from the discomfort of the administration (which can last up to a week) of this medication she notices a slight difference in her sense of taste and in the smell of her urine also lasting about a week after each dose.  Finally, she also continues on denosumab/Xgeva.  She receives that also every 28 days and again 2 out of every 3 doses she receives in [place] and every third dose including today's she receives it here.  She  is being very careful to maintain excellent dental care and to see her dentist regularly.  She has had no dental issues so far.  We are following her CA-27-29, which normalized at the most recent determination Lab Results  Component Value Date   CA2729 43.1 (H) 10/13/2019   CA2729 32.8 07/12/2019   CA2729 37.8 04/19/2019   CA2729 37.1 01/25/2019   CA2729 40.5 (H) 11/02/2018    REVIEW OF SYSTEMS: Sitlaly is having some problems with her right knee.  Recall she is status post left knee replacement.  She is getting colloid shots and that seems to be helping.  Just a little bit of a cough which is worse in the morning and a little worse in the evening.  This may be related to silent reflux.  She is very regular about bowel movements but occasionally they are little loose, never diarrhea.  She has had both her vaccine doses plus the booster and she is at her first Shingrix and flu shots as well.  Aside from all this detailed review of systems today was stable.   BREAST CANCER HISTORY: From the earlier summary notes:  Ryah was referred to Dr. Clarene Essex for evaluation of abdominal discomfort and nausea. Exam and blood work including liver function tests was unremarkable aside from normochromic normocytic anemia. Cholecystitis was suspected and the patient underwent endoscopy followed by abdominal/ pelvic CT scan on 09/29/2013. This showed showed a normal gallbladder. Incidental findings included multiple simple cysts in the liver and aortic atherosclerosis. However mixed lytic and sclerotic bony lesions were noted. On 10/10/2013 the patient underwent biopsy of the right iliac bone, and this showed ([id number]) metastatic invasive ductal carcinoma, grade 2, estrogen and progesterone receptor  strongly positive.  Chabeli has a remote history of breast cancer, which we tried to reconstruct. She was originally diagnosed early in 1997 with stage III disease, and underwent right mastectomy followed by  adjuvant chemotherapy. She then participated in a [facility] protocol with high-dose chemotherapy followed by stem cell rescue 12/22/1995. Unfortunately later that year liver lesions were noted and were biopsy-proven to be metastatic breast cancer. This was not felt to be resectable. However the tumor was estrogen receptor positive and HER-2 positive. The patient was treated with Herceptin (she does not know for what period of time) and was then on tamoxifen until November of 2002. She also participated in a vaccine study at [facility].  Arisbel's subsequent history is as detailed below   PAST MEDICAL HISTORY: Past Medical History:  Diagnosis Date  . Anxiety   . Cancer [facility])    Breast 1997 right tx with mastectomy and chemo, metastatic now  . GERD (gastroesophageal reflux disease)   . Hypercholesterolemia   . Hypertension   . Hypothyroidism   . Osteoarthritis    oa  . Personal history of chemotherapy   . Personal history of radiation therapy   . TIA (transient ischemic attack) last 11-08-15   x 2 total    PAST SURGICAL HISTORY: Past Surgical History:  Procedure Laterality Date  . CATARACT EXTRACTION, BILATERAL    . MASTECTOMY MODIFIED RADICAL Right 1997  . porta cath insertion  1997   later removed  . radiation tx  finished 02-25-16   x 20 tx  . TONSILLECTOMY    . TOTAL HIP ARTHROPLASTY Left 03/12/2016   Procedure: LEFT TOTAL HIP ARTHROPLASTY ANTERIOR APPROACH;  Surgeon: Gaynelle Arabian, MD;  Location: [facility];  Service: Orthopedics;  Laterality: Left;  . TOTAL KNEE ARTHROPLASTY Left   . TUBAL LIGATION      FAMILY HISTORY: Family History  Problem Relation Age of Onset  . Breast cancer Maternal Aunt    The patient's father died at the age of 44 from heart disease. The patient's mother died at the age of 59. The patient had no brothers, one sister. One of the patient's mother's 5 sisters was diagnosed with breast cancer in her 49s   GYNECOLOGIC HISTORY:  No LMP recorded. Patient is  postmenopausal. Menarche age 29, the patient is GX P0. She stopped having periods approximately 1994. She used hormone replacement until 1997. 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 Her husband [name] (goes by [name]") was [employer]. of information all services at [employer]. They live alone, with no pets.   ADVANCED DIRECTIVES: In place   HEALTH MAINTENANCE: Social History   Tobacco Use  . Smoking status: Never Smoker  . Smokeless tobacco: Never Used  Substance Use Topics  . Alcohol use: Yes    Comment: 1 glass wine 4-5 x week  . Drug use: No    Colonoscopy: 2013  PAP: 2010  Bone density: 2012  Lipid panel:  Allergies  Allergen Reactions  . Amoxicillin Hives    Has patient had a PCN reaction causing immediate rash, facial/tongue/throat swelling, SOB or lightheadedness with hypotension:unsure Has patient had a PCN reaction causing severe rash involving mucus membranes or skin necrosis:No Has patient had a PCN reaction that required hospitalization:No Has patient had a PCN reaction occurring within the last 10 years: Yes If all of the above answers are "NO", then may proceed with  Cephalosporin use.   Marland Kitchen Percocet [Oxycodone-Acetaminophen] Nausea And Vomiting  . Percodan [Oxycodone-Aspirin] Nausea And Vomiting  . Tramadol Itching    Current Outpatient Medications  Medication Sig Dispense Refill  . acetaminophen (TYLENOL) 500 MG tablet Take 1,000 mg by mouth every 6 (six) hours as needed for mild pain. Reported on 04/17/2015    . calcium carbonate (OSCAL) 1500 (600 Ca) MG TABS tablet Take 600 mg of elemental calcium by mouth 2 (two) times daily with a meal.    . cholecalciferol (VITAMIN D) 1000 units tablet Take 1,000 Units by mouth daily.    . clopidogrel (PLAVIX) 75 MG tablet Take 1 tablet (75 mg total) by mouth daily.    . cyanocobalamin (,VITAMIN B-12,) 1000 MCG/ML  injection Inject 1,000 mcg into the muscle once.    . famotidine (PEPCID) 40 MG tablet Take 1 tablet (40 mg total) by mouth 2 (two) times daily.    Marland Kitchen levothyroxine (SYNTHROID, LEVOTHROID) 88 MCG tablet Take 88 mcg by mouth daily before breakfast.    . Lutein 6 MG TABS Take 6 mg by mouth 2 (two) times daily.     . magnesium gluconate (MAGONATE) 500 MG tablet Take 500 mg by mouth at bedtime.    . meclizine (ANTIVERT) 25 MG tablet Take 25 mg by mouth 3 (three) times daily as needed for dizziness.    Marland Kitchen METRONIDAZOLE, TOPICAL, 0.75 % LOTN Apply 1 application topically at bedtime. Apply to face for rosacea    . Omega-3 Fatty Acids (FISH OIL) 500 MG CAPS Take by mouth.  0  . palbociclib (IBRANCE) 75 MG capsule Take 1 capsule (75 mg total) by mouth daily with breakfast. Take whole with food on days 1-21, every 28 days. 21 capsule 6  . pantoprazole (PROTONIX) 40 MG tablet Take 40 mg by mouth 2 (two) times daily. Before breakfast and before supper    . Polyethyl Glycol-Propyl Glycol (SYSTANE ULTRA) 0.4-0.3 % SOLN Place 1-2 drops into both eyes 2 (two) times daily.    . polyethylene glycol (MIRALAX / GLYCOLAX) packet Take 4 g by mouth daily. 1 teaspoonful in the morning    . rOPINIRole (REQUIP) 0.5 MG tablet Take 0.5 mg by mouth 3 (three) times daily.    . rosuvastatin (CRESTOR) 20 MG tablet Take 1 tablet (20 mg total) by mouth daily.    . traZODone (DESYREL) 50 MG tablet Take 25 mg by mouth at bedtime.      No current facility-administered medications for this visit.    OBJECTIVE: white woman who appears stated age  20:   01/05/20 0937  BP: (!) 157/89  Pulse: 69  Resp: 17  Temp: (!) 97.4 F (36.3 C)  SpO2: 98%     Body mass index is 30.11 kg/m.    ECOG FS:1 - Symptomatic but completely ambulatory  Sclerae unicteric, EOMs intact Wearing a mask No cervical or supraclavicular adenopathy Lungs no rales or rhonchi Heart regular rate and rhythm Abd soft, nontender, positive bowel sounds MSK  no focal spinal tenderness, no upper extremity lymphedema Neuro: nonfocal, well oriented, appropriate affect Breasts: The right breast is status post mastectomy.  There is an area of telangiectasia defining where she received the boost.  This is unchanged.  Left breast is benign.  Both axillae are benign.   LAB RESULTS:  CMP     Component Value Date/Time   NA 137 10/13/2019 1201   NA 138 02/09/2017 1213   K 4.5 10/13/2019 1201   K 3.9 02/09/2017  1213   CL 100 10/13/2019 1201   CO2 25 10/13/2019 1201   CO2 27 02/09/2017 1213   GLUCOSE 116 (H) 10/13/2019 1201   GLUCOSE 100 02/09/2017 1213   BUN 17 10/13/2019 1201   BUN 12.0 02/09/2017 1213   CREATININE 1.03 (H) 10/13/2019 1201   CREATININE 0.9 02/09/2017 1213   CALCIUM 9.4 10/13/2019 1201   CALCIUM 8.7 02/09/2017 1213   PROT 6.8 10/13/2019 1201   PROT 6.5 02/09/2017 1213   ALBUMIN 4.2 10/13/2019 1201   ALBUMIN 4.1 02/09/2017 1213   AST 21 10/13/2019 1201   AST 24 02/09/2017 1213   ALT 17 10/13/2019 1201   ALT 18 02/09/2017 1213   ALKPHOS 63 10/13/2019 1201   ALKPHOS 62 02/09/2017 1213   BILITOT 0.6 10/13/2019 1201   BILITOT 0.34 02/09/2017 1213   GFRNONAA 54 (L) 10/13/2019 1201   GFRAA >60 10/13/2019 1201   No results found for: SPEP  Lab Results  Component Value Date   WBC 3.8 (L) 01/05/2020   NEUTROABS 2.2 01/05/2020   HGB 12.5 01/05/2020   HCT 37.5 01/05/2020   MCV 90.4 01/05/2020   PLT 193 01/05/2020      Chemistry      Component Value Date/Time   NA 137 10/13/2019 1201   NA 138 02/09/2017 1213   K 4.5 10/13/2019 1201   K 3.9 02/09/2017 1213   CL 100 10/13/2019 1201   CO2 25 10/13/2019 1201   CO2 27 02/09/2017 1213   BUN 17 10/13/2019 1201   BUN 12.0 02/09/2017 1213   CREATININE 1.03 (H) 10/13/2019 1201   CREATININE 0.9 02/09/2017 1213      Component Value Date/Time   CALCIUM 9.4 10/13/2019 1201   CALCIUM 8.7 02/09/2017 1213   ALKPHOS 63 10/13/2019 1201   ALKPHOS 62 02/09/2017 1213   AST 21  10/13/2019 1201   AST 24 02/09/2017 1213   ALT 17 10/13/2019 1201   ALT 18 02/09/2017 1213   BILITOT 0.6 10/13/2019 1201   BILITOT 0.34 02/09/2017 1213      No results for input(s): INR in the last 168 hours.  Urinalysis    Component Value Date/Time   COLORURINE STRAW (A) 03/05/2016 1350    STUDIES:  NM PET Image Restag (PS) Skull Base To Thigh  Result Date: 01/03/2020 CLINICAL DATA:  Subsequent treatment strategy for breast cancer. EXAM: NUCLEAR MEDICINE PET SKULL BASE TO THIGH TECHNIQUE: 8.43 mCi F-18 FDG was injected intravenously. Full-ring PET imaging was performed from the skull base to thigh after the radiotracer. CT data was obtained and used for attenuation correction and anatomic localization. Fasting blood glucose: 116 mg/dl COMPARISON:  01/18/2019 FINDINGS: Mediastinal blood pool activity: SUV max 2.58 Liver activity: SUV max NA NECK: No hypermetabolic lymph nodes in the neck. Incidental CT findings: none CHEST: Signs previous right mastectomy. No FDG avid supraclavicular, axillary, mediastinal, or hilar lymph nodes. No FDG avid pulmonary nodule or mass identified. Tiny nodule within the posterolateral right upper lobe measures 4 mm and is too small to reliably characterize. Incidental CT findings: Unchanged appearance of mildly FDG avid subcutaneous nodule within the medial aspect of the left breast. This measures 4 mm and has an SUV max of 1.81. Previously this was measured at 5 mm within SUV max of 1.7. Aortic atherosclerosis.  Coronary artery calcifications. ABDOMEN/PELVIS: No abnormal FDG uptake within the liver, pancreas, spleen, or adrenal glands. No FDG avid abdominopelvic lymph nodes. Incidental CT findings: Again seen are several scattered low-attenuation foci within  the liver without corresponding FDG uptake, unchanged from previous imaging from 01/18/2019. Favor multiple small cysts. Gallstone.Multiple colonic diverticula noted without signs of acute diverticulitis.  SKELETON: No focal activity to suggest metabolically active skeletal metastasis. Diffuse sclerotic lesions throughout the visualized axial and appendicular skeleton noted without corresponding hypermetabolism. Findings compatible with treated metastases. Incidental CT findings: Status post left hip arthroplasty as noted previously. IMPRESSION: 1. No abnormal FDG uptake identified to suggest residual or recurrent metabolically active disease. 2. Widespread sclerotic bone metastases are again noted without corresponding hypermetabolism on the PET images. Findings are favored to represent treated bone metastases. 3.  Aortic Atherosclerosis (ICD10-I70.0). 4. Coronary artery calcifications 5. Gallstones. Electronically Signed   By: Kerby Moors M.D.   On: 01/03/2020 12:39     ASSESSMENT: 74 y.o. [place] woman with stage IV right breast cancer as of NOV 2007 (bone metastases, subcutaneous nodules)  (1) s/p Right mastectomy 1997 for an estrogen receptor and HER-2 positive breast cancer, treated with  (a) adjuvant chemotherapy according to CALGB 9640 (taxotere x 4 cycles)  (b) high-dose chemotherapy (cyclophosphamide, carboplatin, BCNU) followed by stem cell rescue at [place] 12/22/1995  (c) biopsy-proven metastatic disease November 2007, estrogen receptor and HER-2 positive  (d) trastuzumab (dose? Duration?)  (e) tamoxifen for 5 years completed 2002  (f) participation in a vaccine study at [facility] 2003 (CEA primed dendritic cells, HER-2 primed dendritic cells)  (2) status post right iliac crest biopsy 10/10/2013 for invasive ductal carcinoma, grade 2, estrogen and progesterone receptor positive  (3) zoledronate started 10/12/2013,  repeated every 12 weeks  (a) dexa scan 07/28/2013 normal  (b) zolendronate discontinued after January 2019 dose  (4) biopsy of a scalp lesion 10/17/2013 showed metastatic breast cancer, HER-2/neu negative  (a) biopsy of a right anterior chest wall nodule August 2017 shows  adenocarcinoma  (5) anastrozole started 10/28/2013, changed to letrozole 06/27/2016  (a) measurable disease = subcutaneous nodules in scalp and LLQ abdomen  (b)  CA-27-29 is informative  (c)  Repeat PET scan 12/28/2014 shows continuing response ; exam shows resolution of scalp lesions  (d) new skin lesion removed from right anterior chest wall August 2017  (e) status post radiation to the anterior lower right chest wall completed 02/25/2017 (20 sessions)   (f) PET scan 04/04/2016 shows 2 new foci of metabolic activity (T8 and manubrium)  (g) palbociclib added 04/21/2016 at 125 mg 21/7  (h) palbociclib dose dropped to 100 mg 21/7 starting with the March 2018 cycle  (i) bone scan 09/26/2016 is stable  (j) PET scan 02/03/2017 shows her bone metastases, and in addition a 0.5 cm right middle lobe nodule which may be new  (k) MRI of the cervical spine shows collapse of the left lateral mass of C3.  There is no soft tissue mass or focal neural impingement.  (l) palbociclib reduced to 75 mg as of September 2018  (m) palbociclib held during cervical spine irradiation finished in mid February, 2019  (n) letrozole discontinued February 2019 secondary to likely progression   (6) neoplasia associated pain  (a) painful blastic lesion left femoral intertrochanteric region: Status post radiation completed November 2015  (b) pain left knee, status post TKR remotely  (c) left total hip replacement 03/12/2016  (7) palliative radiation to the cervical spine completed 05/08/2017 in [place]  (8) fulvestrant started 05/04/2017  (a) Palbociclib resumed 06/29/2017, currently at 75 mg every other day, 21 days on 7 days off  (9) denosumab/Xgeva started 06/01/2017, repeated every 28 days  (10) follow-up/staging  studies  (a) PET scan 08/11/2017 shows no evidence for metabolically active tumor.  Sclerotic bone mets do not show hypermetabolism  (b) PET scan 02/11/2018 shows continuing excellent tumor control  (c)  PET scan 08/05/2018 shows same bone lesions but no active disease  (d) PET scan 01/18/2019 finds stable disease  (e) PET scan 01/03/2020 finds no evidence of disease activity.   PLAN: Huntley is now 14 years out from initial diagnosis of stage IV breast cancer.  Her disease is  well controlled on her current treatment.  She has no symptoms related to her disease and she is tolerating the treatment well.  Accordingly we are making no changes.  She continues on fulvestrant every 28days, and palbociclib 75 mg every other day 21 days on 7 days off.  She also receives [place] every 28 days.  2 months out of every 3 she receives these treatments locally at the cancer center in [place].  She is aware of the fact that that is now being given in a different building.  I will not be here in 12 weeks so she will see my 21 assistant then.  She will see me again in 24 weeks.  She knows to call for any other issue that may develop before the next visit  Total encounter time 25 minutes.*   Akeria Hedstrom, Virgie Dad, MD  01/05/20 9:49 AM Medical Oncology and Hematology [facility] [place], [place] [address] Tel. [phone]    Fax. [phone]   I, Wilburn Mylar, am acting as scribe for Dr. Virgie Dad. Nelda Luckey.  I, Lurline Del MD, have reviewed the above documentation for accuracy and completeness, and I agree with the above.   *Total Encounter Time as defined by the Centers for Medicare and Medicaid Services includes, in addition to the face-to-face time of a patient visit (documented in the note above) non-face-to-face time: obtaining and reviewing outside history, ordering and reviewing medications, tests or procedures, care coordination (communications with other health care professionals or caregivers) and documentation in the medical record.

## 2020-01-05 ENCOUNTER — Inpatient Hospital Stay: Payer: Medicare Other | Attending: Oncology | Admitting: Oncology

## 2020-01-05 ENCOUNTER — Inpatient Hospital Stay: Payer: Medicare Other

## 2020-01-05 ENCOUNTER — Other Ambulatory Visit: Payer: Self-pay

## 2020-01-05 VITALS — BP 157/89 | HR 69 | Temp 97.4°F | Resp 17 | Ht 63.0 in | Wt 170.0 lb

## 2020-01-05 DIAGNOSIS — Z170 Estrogen receptor positive status [ER+]: Secondary | ICD-10-CM

## 2020-01-05 DIAGNOSIS — C50911 Malignant neoplasm of unspecified site of right female breast: Secondary | ICD-10-CM

## 2020-01-05 DIAGNOSIS — R978 Other abnormal tumor markers: Secondary | ICD-10-CM | POA: Diagnosis not present

## 2020-01-05 DIAGNOSIS — C44501 Unspecified malignant neoplasm of skin of breast: Secondary | ICD-10-CM | POA: Diagnosis not present

## 2020-01-05 DIAGNOSIS — C50811 Malignant neoplasm of overlapping sites of right female breast: Secondary | ICD-10-CM

## 2020-01-05 DIAGNOSIS — I700 Atherosclerosis of aorta: Secondary | ICD-10-CM

## 2020-01-05 DIAGNOSIS — Z79899 Other long term (current) drug therapy: Secondary | ICD-10-CM | POA: Insufficient documentation

## 2020-01-05 DIAGNOSIS — C7989 Secondary malignant neoplasm of other specified sites: Secondary | ICD-10-CM | POA: Diagnosis not present

## 2020-01-05 DIAGNOSIS — C7951 Secondary malignant neoplasm of bone: Secondary | ICD-10-CM

## 2020-01-05 DIAGNOSIS — Z5111 Encounter for antineoplastic chemotherapy: Secondary | ICD-10-CM | POA: Insufficient documentation

## 2020-01-05 DIAGNOSIS — C50919 Malignant neoplasm of unspecified site of unspecified female breast: Secondary | ICD-10-CM | POA: Diagnosis not present

## 2020-01-05 DIAGNOSIS — C50912 Malignant neoplasm of unspecified site of left female breast: Secondary | ICD-10-CM

## 2020-01-05 LAB — CBC WITH DIFFERENTIAL/PLATELET
Abs Immature Granulocytes: 0.01 10*3/uL (ref 0.00–0.07)
Basophils Absolute: 0.1 10*3/uL (ref 0.0–0.1)
Basophils Relative: 2 %
Eosinophils Absolute: 0.1 10*3/uL (ref 0.0–0.5)
Eosinophils Relative: 3 %
HCT: 37.5 % (ref 36.0–46.0)
Hemoglobin: 12.5 g/dL (ref 12.0–15.0)
Immature Granulocytes: 0 %
Lymphocytes Relative: 25 %
Lymphs Abs: 0.9 10*3/uL (ref 0.7–4.0)
MCH: 30.1 pg (ref 26.0–34.0)
MCHC: 33.3 g/dL (ref 30.0–36.0)
MCV: 90.4 fL (ref 80.0–100.0)
Monocytes Absolute: 0.4 10*3/uL (ref 0.1–1.0)
Monocytes Relative: 11 %
Neutro Abs: 2.2 10*3/uL (ref 1.7–7.7)
Neutrophils Relative %: 59 %
Platelets: 193 10*3/uL (ref 150–400)
RBC: 4.15 MIL/uL (ref 3.87–5.11)
RDW: 14.3 % (ref 11.5–15.5)
WBC: 3.8 10*3/uL — ABNORMAL LOW (ref 4.0–10.5)
nRBC: 0 % (ref 0.0–0.2)

## 2020-01-05 LAB — COMPREHENSIVE METABOLIC PANEL
ALT: 13 U/L (ref 0–44)
AST: 19 U/L (ref 15–41)
Albumin: 4 g/dL (ref 3.5–5.0)
Alkaline Phosphatase: 54 U/L (ref 38–126)
Anion gap: 5 (ref 5–15)
BUN: 14 mg/dL (ref 8–23)
CO2: 27 mmol/L (ref 22–32)
Calcium: 9.3 mg/dL (ref 8.9–10.3)
Chloride: 105 mmol/L (ref 98–111)
Creatinine, Ser: 0.81 mg/dL (ref 0.44–1.00)
GFR, Estimated: >60 mL/min (ref >60)
Glucose, Bld: 77 mg/dL (ref 70–99)
Potassium: 4.1 mmol/L (ref 3.5–5.1)
Sodium: 137 mmol/L (ref 135–145)
Total Bilirubin: 0.5 mg/dL (ref 0.3–1.2)
Total Protein: 6.6 g/dL (ref 6.5–8.1)

## 2020-01-05 MED ORDER — FULVESTRANT 250 MG/5ML IM SOLN
500.0000 mg | Freq: Once | INTRAMUSCULAR | Status: AC
  Administered 2020-01-05: 500 mg via INTRAMUSCULAR

## 2020-01-05 MED ORDER — DENOSUMAB 120 MG/1.7ML ~~LOC~~ SOLN
120.0000 mg | Freq: Once | SUBCUTANEOUS | Status: AC
  Administered 2020-01-05: 120 mg via SUBCUTANEOUS

## 2020-01-05 MED ORDER — DENOSUMAB 120 MG/1.7ML ~~LOC~~ SOLN
SUBCUTANEOUS | Status: AC
  Filled 2020-01-05: qty 1.7

## 2020-01-05 MED ORDER — FULVESTRANT 250 MG/5ML IM SOLN
INTRAMUSCULAR | Status: AC
  Filled 2020-01-05: qty 10

## 2020-01-05 NOTE — Patient Instructions (Signed)
Fulvestrant injection What is this medicine? FULVESTRANT (ful VES trant) blocks the effects of estrogen. It is used to treat breast cancer. This medicine may be used for other purposes; ask your health care provider or pharmacist if you have questions. COMMON BRAND NAME(S): FASLODEX What should I tell my health care provider before I take this medicine? They need to know if you have any of these conditions:  bleeding disorders  liver disease  low blood counts, like low white cell, platelet, or red cell counts  an unusual or allergic reaction to fulvestrant, other medicines, foods, dyes, or preservatives  pregnant or trying to get pregnant  breast-feeding How should I use this medicine? This medicine is for injection into a muscle. It is usually given by a health care professional in a hospital or clinic setting. Talk to your pediatrician regarding the use of this medicine in children. Special care may be needed. Overdosage: If you think you have taken too much of this medicine contact a poison control center or emergency room at once. NOTE: This medicine is only for you. Do not share this medicine with others. What if I miss a dose? It is important not to miss your dose. Call your doctor or health care professional if you are unable to keep an appointment. What may interact with this medicine?  medicines that treat or prevent blood clots like warfarin, enoxaparin, dalteparin, apixaban, dabigatran, and rivaroxaban This list may not describe all possible interactions. Give your health care provider a list of all the medicines, herbs, non-prescription drugs, or dietary supplements you use. Also tell them if you smoke, drink alcohol, or use illegal drugs. Some items may interact with your medicine. What should I watch for while using this medicine? Your condition will be monitored carefully while you are receiving this medicine. You will need important blood work done while you are taking  this medicine. Do not become pregnant while taking this medicine or for at least 1 year after stopping it. Women of child-bearing potential will need to have a negative pregnancy test before starting this medicine. Women should inform their doctor if they wish to become pregnant or think they might be pregnant. There is a potential for serious side effects to an unborn child. Men should inform their doctors if they wish to father a child. This medicine may lower sperm counts. Talk to your health care professional or pharmacist for more information. Do not breast-feed an infant while taking this medicine or for 1 year after the last dose. What side effects may I notice from receiving this medicine? Side effects that you should report to your doctor or health care professional as soon as possible:  allergic reactions like skin rash, itching or hives, swelling of the face, lips, or tongue  feeling faint or lightheaded, falls  pain, tingling, numbness, or weakness in the legs  signs and symptoms of infection like fever or chills; cough; flu-like symptoms; sore throat  vaginal bleeding Side effects that usually do not require medical attention (report to your doctor or health care professional if they continue or are bothersome):  aches, pains  constipation  diarrhea  headache  hot flashes  nausea, vomiting  pain at site where injected  stomach pain This list may not describe all possible side effects. Call your doctor for medical advice about side effects. You may report side effects to FDA at 1-800-FDA-1088. Where should I keep my medicine? This drug is given in a hospital or clinic and will  not be stored at home. NOTE: This sheet is a summary. It may not cover all possible information. If you have questions about this medicine, talk to your doctor, pharmacist, or health care provider.  2020 [employer] Standard (2017-06-18 11:34:41) Denosumab injection What is this  medicine? DENOSUMAB (den oh sue mab) slows bone breakdown. Prolia is used to treat osteoporosis in women after menopause and in men, and in people who are taking corticosteroids for 6 months or more. Delton See is used to treat a high calcium level due to cancer and to prevent bone fractures and other bone problems caused by multiple myeloma or cancer bone metastases. Delton See is also used to treat giant cell tumor of the bone. This medicine may be used for other purposes; ask your health care provider or pharmacist if you have questions. COMMON BRAND NAME(S): Prolia, XGEVA What should I tell my health care provider before I take this medicine? They need to know if you have any of these conditions:  dental disease  having surgery or tooth extraction  infection  kidney disease  low levels of calcium or Vitamin D in the blood  malnutrition  on hemodialysis  skin conditions or sensitivity  thyroid or parathyroid disease  an unusual reaction to denosumab, other medicines, foods, dyes, or preservatives  pregnant or trying to get pregnant  breast-feeding How should I use this medicine? This medicine is for injection under the skin. It is given by a health care professional in a hospital or clinic setting. A special MedGuide will be given to you before each treatment. Be sure to read this information carefully each time. For Prolia, talk to your pediatrician regarding the use of this medicine in children. Special care may be needed. For Delton See, talk to your pediatrician regarding the use of this medicine in children. While this drug may be prescribed for children as young as 13 years for selected conditions, precautions do apply. Overdosage: If you think you have taken too much of this medicine contact a poison control center or emergency room at once. NOTE: This medicine is only for you. Do not share this medicine with others. What if I miss a dose? It is important not to miss your dose. Call  your doctor or health care professional if you are unable to keep an appointment. What may interact with this medicine? Do not take this medicine with any of the following medications:  other medicines containing denosumab This medicine may also interact with the following medications:  medicines that lower your chance of fighting infection  steroid medicines like prednisone or cortisone This list may not describe all possible interactions. Give your health care provider a list of all the medicines, herbs, non-prescription drugs, or dietary supplements you use. Also tell them if you smoke, drink alcohol, or use illegal drugs. Some items may interact with your medicine. What should I watch for while using this medicine? Visit your doctor or health care professional for regular checks on your progress. Your doctor or health care professional may order blood tests and other tests to see how you are doing. Call your doctor or health care professional for advice if you get a fever, chills or sore throat, or other symptoms of a cold or flu. Do not treat yourself. This drug may decrease your body's ability to fight infection. Try to avoid being around people who are sick. You should make sure you get enough calcium and vitamin D while you are taking this medicine, unless your doctor tells  you not to. Discuss the foods you eat and the vitamins you take with your health care professional. See your dentist regularly. Brush and floss your teeth as directed. Before you have any dental work done, tell your dentist you are receiving this medicine. Do not become pregnant while taking this medicine or for 5 months after stopping it. Talk with your doctor or health care professional about your birth control options while taking this medicine. Women should inform their doctor if they wish to become pregnant or think they might be pregnant. There is a potential for serious side effects to an unborn child. Talk to your  health care professional or pharmacist for more information. What side effects may I notice from receiving this medicine? Side effects that you should report to your doctor or health care professional as soon as possible:  allergic reactions like skin rash, itching or hives, swelling of the face, lips, or tongue  bone pain  breathing problems  dizziness  jaw pain, especially after dental work  redness, blistering, peeling of the skin  signs and symptoms of infection like fever or chills; cough; sore throat; pain or trouble passing urine  signs of low calcium like fast heartbeat, muscle cramps or muscle pain; pain, tingling, numbness in the hands or feet; seizures  unusual bleeding or bruising  unusually weak or tired Side effects that usually do not require medical attention (report to your doctor or health care professional if they continue or are bothersome):  constipation  diarrhea  headache  joint pain  loss of appetite  muscle pain  runny nose  tiredness  upset stomach This list may not describe all possible side effects. Call your doctor for medical advice about side effects. You may report side effects to FDA at 1-800-FDA-1088. Where should I keep my medicine? This medicine is only given in a clinic, doctor's office, or other health care setting and will not be stored at home. NOTE: This sheet is a summary. It may not cover all possible information. If you have questions about this medicine, talk to your doctor, pharmacist, or health care provider.  2020 [employer] Standard (2017-07-17 16:10:44)  

## 2020-01-06 ENCOUNTER — Telehealth: Payer: Self-pay | Admitting: Oncology

## 2020-01-06 LAB — CANCER ANTIGEN 27.29: CA 27.29: 30.1 U/mL (ref 0.0–38.6)

## 2020-01-06 NOTE — Telephone Encounter (Signed)
Scheduled appt per 10/14 los - -pt aware of appt date and time  

## 2020-01-10 DIAGNOSIS — E538 Deficiency of other specified B group vitamins: Secondary | ICD-10-CM | POA: Diagnosis not present

## 2020-01-21 DIAGNOSIS — K219 Gastro-esophageal reflux disease without esophagitis: Secondary | ICD-10-CM | POA: Diagnosis not present

## 2020-01-21 DIAGNOSIS — E785 Hyperlipidemia, unspecified: Secondary | ICD-10-CM | POA: Diagnosis not present

## 2020-01-21 DIAGNOSIS — I10 Essential (primary) hypertension: Secondary | ICD-10-CM | POA: Diagnosis not present

## 2020-01-21 DIAGNOSIS — E039 Hypothyroidism, unspecified: Secondary | ICD-10-CM | POA: Diagnosis not present

## 2020-01-24 NOTE — Progress Notes (Signed)
PT STABLE AT TIME OF DISCHARGE 

## 2020-02-01 ENCOUNTER — Inpatient Hospital Stay: Payer: Medicare Other | Attending: Oncology

## 2020-02-01 ENCOUNTER — Other Ambulatory Visit: Payer: Self-pay | Admitting: Pharmacist

## 2020-02-01 ENCOUNTER — Other Ambulatory Visit: Payer: Self-pay

## 2020-02-01 DIAGNOSIS — C7951 Secondary malignant neoplasm of bone: Secondary | ICD-10-CM | POA: Diagnosis not present

## 2020-02-01 DIAGNOSIS — Z79899 Other long term (current) drug therapy: Secondary | ICD-10-CM | POA: Diagnosis not present

## 2020-02-01 DIAGNOSIS — C50919 Malignant neoplasm of unspecified site of unspecified female breast: Secondary | ICD-10-CM

## 2020-02-01 DIAGNOSIS — C7989 Secondary malignant neoplasm of other specified sites: Secondary | ICD-10-CM | POA: Insufficient documentation

## 2020-02-01 DIAGNOSIS — R978 Other abnormal tumor markers: Secondary | ICD-10-CM | POA: Insufficient documentation

## 2020-02-01 DIAGNOSIS — Z5111 Encounter for antineoplastic chemotherapy: Secondary | ICD-10-CM | POA: Insufficient documentation

## 2020-02-01 DIAGNOSIS — C44501 Unspecified malignant neoplasm of skin of breast: Secondary | ICD-10-CM | POA: Insufficient documentation

## 2020-02-01 DIAGNOSIS — Z170 Estrogen receptor positive status [ER+]: Secondary | ICD-10-CM

## 2020-02-01 DIAGNOSIS — C50811 Malignant neoplasm of overlapping sites of right female breast: Secondary | ICD-10-CM

## 2020-02-01 DIAGNOSIS — C50911 Malignant neoplasm of unspecified site of right female breast: Secondary | ICD-10-CM

## 2020-02-01 LAB — CBC WITH DIFFERENTIAL/PLATELET
Abs Immature Granulocytes: 0.02 10*3/uL (ref 0.00–0.07)
Basophils Absolute: 0.1 10*3/uL (ref 0.0–0.1)
Basophils Relative: 3 %
Eosinophils Absolute: 0.1 10*3/uL (ref 0.0–0.5)
Eosinophils Relative: 2 %
HCT: 42 % (ref 36.0–46.0)
Hemoglobin: 13.5 g/dL (ref 12.0–15.0)
Immature Granulocytes: 1 %
Lymphocytes Relative: 24 %
Lymphs Abs: 0.7 10*3/uL (ref 0.7–4.0)
MCH: 30.5 pg (ref 26.0–34.0)
MCHC: 32.1 g/dL (ref 30.0–36.0)
MCV: 95 fL (ref 80.0–100.0)
Monocytes Absolute: 0.4 10*3/uL (ref 0.1–1.0)
Monocytes Relative: 14 %
Neutro Abs: 1.7 10*3/uL (ref 1.7–7.7)
Neutrophils Relative %: 56 %
Platelets: 211 10*3/uL (ref 150–400)
RBC: 4.42 MIL/uL (ref 3.87–5.11)
RDW: 15 % (ref 11.5–15.5)
WBC: 3 10*3/uL — ABNORMAL LOW (ref 4.0–10.5)
nRBC: 0 % (ref 0.0–0.2)

## 2020-02-01 LAB — COMPREHENSIVE METABOLIC PANEL
ALT: 18 U/L (ref 0–44)
AST: 25 U/L (ref 15–41)
Albumin: 4.5 g/dL (ref 3.5–5.0)
Alkaline Phosphatase: 47 U/L (ref 38–126)
Anion gap: 10 (ref 5–15)
BUN: 14 mg/dL (ref 8–23)
CO2: 27 mmol/L (ref 22–32)
Calcium: 9.4 mg/dL (ref 8.9–10.3)
Chloride: 102 mmol/L (ref 98–111)
Creatinine, Ser: 0.85 mg/dL (ref 0.44–1.00)
GFR, Estimated: >60 mL/min (ref >60)
Glucose, Bld: 87 mg/dL (ref 70–99)
Potassium: 4.1 mmol/L (ref 3.5–5.1)
Sodium: 139 mmol/L (ref 135–145)
Total Bilirubin: 0.5 mg/dL (ref 0.3–1.2)
Total Protein: 7.2 g/dL (ref 6.5–8.1)

## 2020-02-02 ENCOUNTER — Inpatient Hospital Stay: Payer: Medicare Other

## 2020-02-02 ENCOUNTER — Other Ambulatory Visit: Payer: Medicare Other

## 2020-02-02 VITALS — BP 171/80 | HR 61 | Temp 98.0°F | Resp 18 | Ht 64.0 in | Wt 171.2 lb

## 2020-02-02 DIAGNOSIS — Z79899 Other long term (current) drug therapy: Secondary | ICD-10-CM | POA: Diagnosis not present

## 2020-02-02 DIAGNOSIS — R978 Other abnormal tumor markers: Secondary | ICD-10-CM | POA: Diagnosis not present

## 2020-02-02 DIAGNOSIS — C50919 Malignant neoplasm of unspecified site of unspecified female breast: Secondary | ICD-10-CM

## 2020-02-02 DIAGNOSIS — Z5111 Encounter for antineoplastic chemotherapy: Secondary | ICD-10-CM | POA: Diagnosis not present

## 2020-02-02 DIAGNOSIS — C7951 Secondary malignant neoplasm of bone: Secondary | ICD-10-CM | POA: Diagnosis not present

## 2020-02-02 DIAGNOSIS — C7989 Secondary malignant neoplasm of other specified sites: Secondary | ICD-10-CM | POA: Diagnosis not present

## 2020-02-02 DIAGNOSIS — C44501 Unspecified malignant neoplasm of skin of breast: Secondary | ICD-10-CM | POA: Diagnosis not present

## 2020-02-02 LAB — CANCER ANTIGEN 27.29: CA 27.29: 32.6 U/mL (ref 0.0–38.6)

## 2020-02-02 MED ORDER — DENOSUMAB 120 MG/1.7ML ~~LOC~~ SOLN
SUBCUTANEOUS | Status: AC
  Filled 2020-02-02: qty 1.7

## 2020-02-02 MED ORDER — FULVESTRANT 250 MG/5ML IM SOLN
INTRAMUSCULAR | Status: AC
  Filled 2020-02-02: qty 10

## 2020-02-02 MED ORDER — DENOSUMAB 120 MG/1.7ML ~~LOC~~ SOLN
120.0000 mg | Freq: Once | SUBCUTANEOUS | Status: AC
  Administered 2020-02-02: 120 mg via SUBCUTANEOUS

## 2020-02-02 MED ORDER — FULVESTRANT 250 MG/5ML IM SOLN
500.0000 mg | Freq: Once | INTRAMUSCULAR | Status: AC
  Administered 2020-02-02: 500 mg via INTRAMUSCULAR

## 2020-02-02 NOTE — Patient Instructions (Signed)
Denosumab injection What is this medicine? DENOSUMAB (den oh sue mab) slows bone breakdown. Prolia is used to treat osteoporosis in women after menopause and in men, and in people who are taking corticosteroids for 6 months or more. Xgeva is used to treat a high calcium level due to cancer and to prevent bone fractures and other bone problems caused by multiple myeloma or cancer bone metastases. Xgeva is also used to treat giant cell tumor of the bone. This medicine may be used for other purposes; ask your health care provider or pharmacist if you have questions. COMMON BRAND NAME(S): Prolia, XGEVA What should I tell my health care provider before I take this medicine? They need to know if you have any of these conditions:  dental disease  having surgery or tooth extraction  infection  kidney disease  low levels of calcium or Vitamin D in the blood  malnutrition  on hemodialysis  skin conditions or sensitivity  thyroid or parathyroid disease  an unusual reaction to denosumab, other medicines, foods, dyes, or preservatives  pregnant or trying to get pregnant  breast-feeding How should I use this medicine? This medicine is for injection under the skin. It is given by a health care professional in a hospital or clinic setting. A special MedGuide will be given to you before each treatment. Be sure to read this information carefully each time. For Prolia, talk to your pediatrician regarding the use of this medicine in children. Special care may be needed. For Xgeva, talk to your pediatrician regarding the use of this medicine in children. While this drug may be prescribed for children as young as 13 years for selected conditions, precautions do apply. Overdosage: If you think you have taken too much of this medicine contact a poison control center or emergency room at once. NOTE: This medicine is only for you. Do not share this medicine with others. What if I miss a dose? It is  important not to miss your dose. Call your doctor or health care professional if you are unable to keep an appointment. What may interact with this medicine? Do not take this medicine with any of the following medications:  other medicines containing denosumab This medicine may also interact with the following medications:  medicines that lower your chance of fighting infection  steroid medicines like prednisone or cortisone This list may not describe all possible interactions. Give your health care provider a list of all the medicines, herbs, non-prescription drugs, or dietary supplements you use. Also tell them if you smoke, drink alcohol, or use illegal drugs. Some items may interact with your medicine. What should I watch for while using this medicine? Visit your doctor or health care professional for regular checks on your progress. Your doctor or health care professional may order blood tests and other tests to see how you are doing. Call your doctor or health care professional for advice if you get a fever, chills or sore throat, or other symptoms of a cold or flu. Do not treat yourself. This drug may decrease your body's ability to fight infection. Try to avoid being around people who are sick. You should make sure you get enough calcium and vitamin D while you are taking this medicine, unless your doctor tells you not to. Discuss the foods you eat and the vitamins you take with your health care professional. See your dentist regularly. Brush and floss your teeth as directed. Before you have any dental work done, tell your dentist you are   receiving this medicine. Do not become pregnant while taking this medicine or for 5 months after stopping it. Talk with your doctor or health care professional about your birth control options while taking this medicine. Women should inform their doctor if they wish to become pregnant or think they might be pregnant. There is a potential for serious side  effects to an unborn child. Talk to your health care professional or pharmacist for more information. What side effects may I notice from receiving this medicine? Side effects that you should report to your doctor or health care professional as soon as possible:  allergic reactions like skin rash, itching or hives, swelling of the face, lips, or tongue  bone pain  breathing problems  dizziness  jaw pain, especially after dental work  redness, blistering, peeling of the skin  signs and symptoms of infection like fever or chills; cough; sore throat; pain or trouble passing urine  signs of low calcium like fast heartbeat, muscle cramps or muscle pain; pain, tingling, numbness in the hands or feet; seizures  unusual bleeding or bruising  unusually weak or tired Side effects that usually do not require medical attention (report to your doctor or health care professional if they continue or are bothersome):  constipation  diarrhea  headache  joint pain  loss of appetite  muscle pain  runny nose  tiredness  upset stomach This list may not describe all possible side effects. Call your doctor for medical advice about side effects. You may report side effects to FDA at 1-800-FDA-1088. Where should I keep my medicine? This medicine is only given in a clinic, doctor's office, or other health care setting and will not be stored at home. NOTE: This sheet is a summary. It may not cover all possible information. If you have questions about this medicine, talk to your doctor, pharmacist, or health care provider.  2020 [employer] Standard (2017-07-17 16:10:44) Fulvestrant injection What is this medicine? FULVESTRANT (ful VES trant) blocks the effects of estrogen. It is used to treat breast cancer. This medicine may be used for other purposes; ask your health care provider or pharmacist if you have questions. COMMON BRAND NAME(S): FASLODEX What should I tell my health care  provider before I take this medicine? They need to know if you have any of these conditions:  bleeding disorders  liver disease  low blood counts, like low white cell, platelet, or red cell counts  an unusual or allergic reaction to fulvestrant, other medicines, foods, dyes, or preservatives  pregnant or trying to get pregnant  breast-feeding How should I use this medicine? This medicine is for injection into a muscle. It is usually given by a health care professional in a hospital or clinic setting. Talk to your pediatrician regarding the use of this medicine in children. Special care may be needed. Overdosage: If you think you have taken too much of this medicine contact a poison control center or emergency room at once. NOTE: This medicine is only for you. Do not share this medicine with others. What if I miss a dose? It is important not to miss your dose. Call your doctor or health care professional if you are unable to keep an appointment. What may interact with this medicine?  medicines that treat or prevent blood clots like warfarin, enoxaparin, dalteparin, apixaban, dabigatran, and rivaroxaban This list may not describe all possible interactions. Give your health care provider a list of all the medicines, herbs, non-prescription drugs, or dietary supplements you use.   Also tell them if you smoke, drink alcohol, or use illegal drugs. Some items may interact with your medicine. What should I watch for while using this medicine? Your condition will be monitored carefully while you are receiving this medicine. You will need important blood work done while you are taking this medicine. Do not become pregnant while taking this medicine or for at least 1 year after stopping it. Women of child-bearing potential will need to have a negative pregnancy test before starting this medicine. Women should inform their doctor if they wish to become pregnant or think they might be pregnant. There is  a potential for serious side effects to an unborn child. Men should inform their doctors if they wish to father a child. This medicine may lower sperm counts. Talk to your health care professional or pharmacist for more information. Do not breast-feed an infant while taking this medicine or for 1 year after the last dose. What side effects may I notice from receiving this medicine? Side effects that you should report to your doctor or health care professional as soon as possible:  allergic reactions like skin rash, itching or hives, swelling of the face, lips, or tongue  feeling faint or lightheaded, falls  pain, tingling, numbness, or weakness in the legs  signs and symptoms of infection like fever or chills; cough; flu-like symptoms; sore throat  vaginal bleeding Side effects that usually do not require medical attention (report to your doctor or health care professional if they continue or are bothersome):  aches, pains  constipation  diarrhea  headache  hot flashes  nausea, vomiting  pain at site where injected  stomach pain This list may not describe all possible side effects. Call your doctor for medical advice about side effects. You may report side effects to FDA at 1-800-FDA-1088. Where should I keep my medicine? This drug is given in a hospital or clinic and will not be stored at home. NOTE: This sheet is a summary. It may not cover all possible information. If you have questions about this medicine, talk to your doctor, pharmacist, or health care provider.  2020 [employer]/Gold Standard (2017-06-18 11:34:41)  

## 2020-02-02 NOTE — Progress Notes (Signed)
PT STABLE AT TIME OF DISCHARGE 

## 2020-02-06 DIAGNOSIS — E538 Deficiency of other specified B group vitamins: Secondary | ICD-10-CM | POA: Diagnosis not present

## 2020-02-09 DIAGNOSIS — Z961 Presence of intraocular lens: Secondary | ICD-10-CM | POA: Diagnosis not present

## 2020-02-09 DIAGNOSIS — H18051 Posterior corneal pigmentations, right eye: Secondary | ICD-10-CM | POA: Diagnosis not present

## 2020-02-09 DIAGNOSIS — H04123 Dry eye syndrome of bilateral lacrimal glands: Secondary | ICD-10-CM | POA: Diagnosis not present

## 2020-02-09 DIAGNOSIS — H353131 Nonexudative age-related macular degeneration, bilateral, early dry stage: Secondary | ICD-10-CM | POA: Diagnosis not present

## 2020-02-20 DIAGNOSIS — M1711 Unilateral primary osteoarthritis, right knee: Secondary | ICD-10-CM | POA: Diagnosis not present

## 2020-02-21 DIAGNOSIS — E039 Hypothyroidism, unspecified: Secondary | ICD-10-CM | POA: Diagnosis not present

## 2020-02-21 DIAGNOSIS — E785 Hyperlipidemia, unspecified: Secondary | ICD-10-CM | POA: Diagnosis not present

## 2020-02-21 DIAGNOSIS — K219 Gastro-esophageal reflux disease without esophagitis: Secondary | ICD-10-CM | POA: Diagnosis not present

## 2020-02-21 DIAGNOSIS — I10 Essential (primary) hypertension: Secondary | ICD-10-CM | POA: Diagnosis not present

## 2020-02-27 DIAGNOSIS — M1711 Unilateral primary osteoarthritis, right knee: Secondary | ICD-10-CM | POA: Diagnosis not present

## 2020-02-28 ENCOUNTER — Inpatient Hospital Stay: Payer: Medicare Other | Attending: Oncology

## 2020-02-28 ENCOUNTER — Other Ambulatory Visit: Payer: Self-pay

## 2020-02-28 ENCOUNTER — Other Ambulatory Visit: Payer: Self-pay | Admitting: Hematology and Oncology

## 2020-02-28 DIAGNOSIS — C44501 Unspecified malignant neoplasm of skin of breast: Secondary | ICD-10-CM | POA: Insufficient documentation

## 2020-02-28 DIAGNOSIS — Z79899 Other long term (current) drug therapy: Secondary | ICD-10-CM | POA: Insufficient documentation

## 2020-02-28 DIAGNOSIS — C7951 Secondary malignant neoplasm of bone: Secondary | ICD-10-CM | POA: Diagnosis not present

## 2020-02-28 DIAGNOSIS — C7989 Secondary malignant neoplasm of other specified sites: Secondary | ICD-10-CM | POA: Diagnosis not present

## 2020-02-28 DIAGNOSIS — Z0001 Encounter for general adult medical examination with abnormal findings: Secondary | ICD-10-CM | POA: Diagnosis not present

## 2020-02-28 DIAGNOSIS — C50919 Malignant neoplasm of unspecified site of unspecified female breast: Secondary | ICD-10-CM

## 2020-02-28 DIAGNOSIS — R978 Other abnormal tumor markers: Secondary | ICD-10-CM | POA: Diagnosis not present

## 2020-02-28 DIAGNOSIS — Z5111 Encounter for antineoplastic chemotherapy: Secondary | ICD-10-CM | POA: Insufficient documentation

## 2020-02-28 DIAGNOSIS — C50811 Malignant neoplasm of overlapping sites of right female breast: Secondary | ICD-10-CM

## 2020-02-28 DIAGNOSIS — Z170 Estrogen receptor positive status [ER+]: Secondary | ICD-10-CM

## 2020-02-28 DIAGNOSIS — D649 Anemia, unspecified: Secondary | ICD-10-CM | POA: Diagnosis not present

## 2020-02-28 DIAGNOSIS — C50911 Malignant neoplasm of unspecified site of right female breast: Secondary | ICD-10-CM

## 2020-02-28 LAB — BASIC METABOLIC PANEL
BUN: 12 (ref 4–21)
CO2: 27 — AB (ref 13–22)
Chloride: 103 (ref 99–108)
Creatinine: 0.7 (ref 0.5–1.1)
Glucose: 91
Potassium: 4.3 (ref 3.4–5.3)
Sodium: 137 (ref 137–147)

## 2020-02-28 LAB — COMPREHENSIVE METABOLIC PANEL
Albumin: 4.5 (ref 3.5–5.0)
Calcium: 9.4 (ref 8.7–10.7)

## 2020-02-28 LAB — CBC
MCV: 91 (ref 76–111)
RBC: 4.22 (ref 3.87–5.11)

## 2020-02-28 LAB — HEPATIC FUNCTION PANEL
ALT: 18 (ref 7–35)
AST: 30 (ref 13–35)
Alkaline Phosphatase: 60 (ref 25–125)
Bilirubin, Total: 0.6

## 2020-02-28 LAB — CBC AND DIFFERENTIAL
HCT: 38 (ref 36–46)
Hemoglobin: 13 (ref 12.0–16.0)
Neutrophils Absolute: 1.62
Platelets: 175 (ref 150–399)
WBC: 2.7

## 2020-02-29 LAB — CANCER ANTIGEN 27.29: CA 27.29: 25.1 U/mL (ref 0.0–38.6)

## 2020-03-01 ENCOUNTER — Other Ambulatory Visit: Payer: Self-pay

## 2020-03-01 ENCOUNTER — Inpatient Hospital Stay: Payer: Medicare Other

## 2020-03-01 VITALS — BP 170/68 | HR 67 | Temp 98.2°F | Resp 18 | Ht 64.0 in | Wt 172.8 lb

## 2020-03-01 DIAGNOSIS — C44501 Unspecified malignant neoplasm of skin of breast: Secondary | ICD-10-CM | POA: Diagnosis not present

## 2020-03-01 DIAGNOSIS — Z5111 Encounter for antineoplastic chemotherapy: Secondary | ICD-10-CM | POA: Diagnosis not present

## 2020-03-01 DIAGNOSIS — C50919 Malignant neoplasm of unspecified site of unspecified female breast: Secondary | ICD-10-CM

## 2020-03-01 DIAGNOSIS — C7951 Secondary malignant neoplasm of bone: Secondary | ICD-10-CM | POA: Diagnosis not present

## 2020-03-01 DIAGNOSIS — Z79899 Other long term (current) drug therapy: Secondary | ICD-10-CM | POA: Diagnosis not present

## 2020-03-01 DIAGNOSIS — R978 Other abnormal tumor markers: Secondary | ICD-10-CM | POA: Diagnosis not present

## 2020-03-01 DIAGNOSIS — C7989 Secondary malignant neoplasm of other specified sites: Secondary | ICD-10-CM | POA: Diagnosis not present

## 2020-03-01 MED ORDER — DENOSUMAB 120 MG/1.7ML ~~LOC~~ SOLN
SUBCUTANEOUS | Status: AC
  Filled 2020-03-01: qty 1.7

## 2020-03-01 MED ORDER — DENOSUMAB 120 MG/1.7ML ~~LOC~~ SOLN
120.0000 mg | Freq: Once | SUBCUTANEOUS | Status: AC
  Administered 2020-03-01: 120 mg via SUBCUTANEOUS

## 2020-03-01 MED ORDER — FULVESTRANT 250 MG/5ML IM SOLN
INTRAMUSCULAR | Status: AC
  Filled 2020-03-01: qty 10

## 2020-03-01 MED ORDER — FULVESTRANT 250 MG/5ML IM SOLN
500.0000 mg | Freq: Once | INTRAMUSCULAR | Status: AC
  Administered 2020-03-01: 500 mg via INTRAMUSCULAR

## 2020-03-01 NOTE — Progress Notes (Signed)
PT STABLE AT TIME OF DISCHARGE 

## 2020-03-01 NOTE — Patient Instructions (Signed)
Denosumab injection What is this medicine? DENOSUMAB (den oh sue mab) slows bone breakdown. Prolia is used to treat osteoporosis in women after menopause and in men, and in people who are taking corticosteroids for 6 months or more. Xgeva is used to treat a high calcium level due to cancer and to prevent bone fractures and other bone problems caused by multiple myeloma or cancer bone metastases. Xgeva is also used to treat giant cell tumor of the bone. This medicine may be used for other purposes; ask your health care provider or pharmacist if you have questions. COMMON BRAND NAME(S): Prolia, XGEVA What should I tell my health care provider before I take this medicine? They need to know if you have any of these conditions:  dental disease  having surgery or tooth extraction  infection  kidney disease  low levels of calcium or Vitamin D in the blood  malnutrition  on hemodialysis  skin conditions or sensitivity  thyroid or parathyroid disease  an unusual reaction to denosumab, other medicines, foods, dyes, or preservatives  pregnant or trying to get pregnant  breast-feeding How should I use this medicine? This medicine is for injection under the skin. It is given by a health care professional in a hospital or clinic setting. A special MedGuide will be given to you before each treatment. Be sure to read this information carefully each time. For Prolia, talk to your pediatrician regarding the use of this medicine in children. Special care may be needed. For Xgeva, talk to your pediatrician regarding the use of this medicine in children. While this drug may be prescribed for children as young as 13 years for selected conditions, precautions do apply. Overdosage: If you think you have taken too much of this medicine contact a poison control center or emergency room at once. NOTE: This medicine is only for you. Do not share this medicine with others. What if I miss a dose? It is  important not to miss your dose. Call your doctor or health care professional if you are unable to keep an appointment. What may interact with this medicine? Do not take this medicine with any of the following medications:  other medicines containing denosumab This medicine may also interact with the following medications:  medicines that lower your chance of fighting infection  steroid medicines like prednisone or cortisone This list may not describe all possible interactions. Give your health care provider a list of all the medicines, herbs, non-prescription drugs, or dietary supplements you use. Also tell them if you smoke, drink alcohol, or use illegal drugs. Some items may interact with your medicine. What should I watch for while using this medicine? Visit your doctor or health care professional for regular checks on your progress. Your doctor or health care professional may order blood tests and other tests to see how you are doing. Call your doctor or health care professional for advice if you get a fever, chills or sore throat, or other symptoms of a cold or flu. Do not treat yourself. This drug may decrease your body's ability to fight infection. Try to avoid being around people who are sick. You should make sure you get enough calcium and vitamin D while you are taking this medicine, unless your doctor tells you not to. Discuss the foods you eat and the vitamins you take with your health care professional. See your dentist regularly. Brush and floss your teeth as directed. Before you have any dental work done, tell your dentist you are   receiving this medicine. Do not become pregnant while taking this medicine or for 5 months after stopping it. Talk with your doctor or health care professional about your birth control options while taking this medicine. Women should inform their doctor if they wish to become pregnant or think they might be pregnant. There is a potential for serious side  effects to an unborn child. Talk to your health care professional or pharmacist for more information. What side effects may I notice from receiving this medicine? Side effects that you should report to your doctor or health care professional as soon as possible:  allergic reactions like skin rash, itching or hives, swelling of the face, lips, or tongue  bone pain  breathing problems  dizziness  jaw pain, especially after dental work  redness, blistering, peeling of the skin  signs and symptoms of infection like fever or chills; cough; sore throat; pain or trouble passing urine  signs of low calcium like fast heartbeat, muscle cramps or muscle pain; pain, tingling, numbness in the hands or feet; seizures  unusual bleeding or bruising  unusually weak or tired Side effects that usually do not require medical attention (report to your doctor or health care professional if they continue or are bothersome):  constipation  diarrhea  headache  joint pain  loss of appetite  muscle pain  runny nose  tiredness  upset stomach This list may not describe all possible side effects. Call your doctor for medical advice about side effects. You may report side effects to FDA at 1-800-FDA-1088. Where should I keep my medicine? This medicine is only given in a clinic, doctor's office, or other health care setting and will not be stored at home. NOTE: This sheet is a summary. It may not cover all possible information. If you have questions about this medicine, talk to your doctor, pharmacist, or health care provider.  2020 [employer] Standard (2017-07-17 16:10:44) Fulvestrant injection What is this medicine? FULVESTRANT (ful VES trant) blocks the effects of estrogen. It is used to treat breast cancer. This medicine may be used for other purposes; ask your health care provider or pharmacist if you have questions. COMMON BRAND NAME(S): FASLODEX What should I tell my health care  provider before I take this medicine? They need to know if you have any of these conditions:  bleeding disorders  liver disease  low blood counts, like low white cell, platelet, or red cell counts  an unusual or allergic reaction to fulvestrant, other medicines, foods, dyes, or preservatives  pregnant or trying to get pregnant  breast-feeding How should I use this medicine? This medicine is for injection into a muscle. It is usually given by a health care professional in a hospital or clinic setting. Talk to your pediatrician regarding the use of this medicine in children. Special care may be needed. Overdosage: If you think you have taken too much of this medicine contact a poison control center or emergency room at once. NOTE: This medicine is only for you. Do not share this medicine with others. What if I miss a dose? It is important not to miss your dose. Call your doctor or health care professional if you are unable to keep an appointment. What may interact with this medicine?  medicines that treat or prevent blood clots like warfarin, enoxaparin, dalteparin, apixaban, dabigatran, and rivaroxaban This list may not describe all possible interactions. Give your health care provider a list of all the medicines, herbs, non-prescription drugs, or dietary supplements you use.   Also tell them if you smoke, drink alcohol, or use illegal drugs. Some items may interact with your medicine. What should I watch for while using this medicine? Your condition will be monitored carefully while you are receiving this medicine. You will need important blood work done while you are taking this medicine. Do not become pregnant while taking this medicine or for at least 1 year after stopping it. Women of child-bearing potential will need to have a negative pregnancy test before starting this medicine. Women should inform their doctor if they wish to become pregnant or think they might be pregnant. There is  a potential for serious side effects to an unborn child. Men should inform their doctors if they wish to father a child. This medicine may lower sperm counts. Talk to your health care professional or pharmacist for more information. Do not breast-feed an infant while taking this medicine or for 1 year after the last dose. What side effects may I notice from receiving this medicine? Side effects that you should report to your doctor or health care professional as soon as possible:  allergic reactions like skin rash, itching or hives, swelling of the face, lips, or tongue  feeling faint or lightheaded, falls  pain, tingling, numbness, or weakness in the legs  signs and symptoms of infection like fever or chills; cough; flu-like symptoms; sore throat  vaginal bleeding Side effects that usually do not require medical attention (report to your doctor or health care professional if they continue or are bothersome):  aches, pains  constipation  diarrhea  headache  hot flashes  nausea, vomiting  pain at site where injected  stomach pain This list may not describe all possible side effects. Call your doctor for medical advice about side effects. You may report side effects to FDA at 1-800-FDA-1088. Where should I keep my medicine? This drug is given in a hospital or clinic and will not be stored at home. NOTE: This sheet is a summary. It may not cover all possible information. If you have questions about this medicine, talk to your doctor, pharmacist, or health care provider.  2020 [employer]/Gold Standard (2017-06-18 11:34:41)  

## 2020-03-05 DIAGNOSIS — M1711 Unilateral primary osteoarthritis, right knee: Secondary | ICD-10-CM | POA: Diagnosis not present

## 2020-03-06 DIAGNOSIS — R0789 Other chest pain: Secondary | ICD-10-CM | POA: Diagnosis not present

## 2020-03-06 DIAGNOSIS — L905 Scar conditions and fibrosis of skin: Secondary | ICD-10-CM | POA: Diagnosis not present

## 2020-03-06 DIAGNOSIS — Z6830 Body mass index (BMI) 30.0-30.9, adult: Secondary | ICD-10-CM | POA: Diagnosis not present

## 2020-03-12 ENCOUNTER — Telehealth: Payer: Self-pay | Admitting: Oncology

## 2020-03-12 NOTE — Telephone Encounter (Signed)
Patient called to schedule her Labs, Injection for Feb 2022.  Appt's made

## 2020-03-14 ENCOUNTER — Other Ambulatory Visit: Payer: Self-pay | Admitting: Oncology

## 2020-03-14 DIAGNOSIS — Z1231 Encounter for screening mammogram for malignant neoplasm of breast: Secondary | ICD-10-CM

## 2020-03-22 DIAGNOSIS — E782 Mixed hyperlipidemia: Secondary | ICD-10-CM | POA: Diagnosis not present

## 2020-03-22 DIAGNOSIS — G4700 Insomnia, unspecified: Secondary | ICD-10-CM | POA: Diagnosis not present

## 2020-03-22 DIAGNOSIS — I10 Essential (primary) hypertension: Secondary | ICD-10-CM | POA: Diagnosis not present

## 2020-03-29 ENCOUNTER — Encounter: Payer: Self-pay | Admitting: Adult Health

## 2020-03-29 ENCOUNTER — Inpatient Hospital Stay: Payer: Medicare Other

## 2020-03-29 ENCOUNTER — Inpatient Hospital Stay: Payer: Medicare Other | Attending: Oncology

## 2020-03-29 ENCOUNTER — Other Ambulatory Visit: Payer: Self-pay

## 2020-03-29 ENCOUNTER — Inpatient Hospital Stay (HOSPITAL_BASED_OUTPATIENT_CLINIC_OR_DEPARTMENT_OTHER): Payer: Medicare Other | Admitting: Adult Health

## 2020-03-29 ENCOUNTER — Ambulatory Visit (HOSPITAL_COMMUNITY)
Admission: RE | Admit: 2020-03-29 | Discharge: 2020-03-29 | Disposition: A | Discharge status: Home / Self Care | Payer: Medicare Other | Source: Ambulatory Visit | Attending: Adult Health | Admitting: Adult Health

## 2020-03-29 ENCOUNTER — Telehealth: Payer: Self-pay | Admitting: Pharmacist

## 2020-03-29 VITALS — BP 174/73 | HR 62 | Temp 97.2°F | Resp 18 | Ht 64.0 in | Wt 174.3 lb

## 2020-03-29 DIAGNOSIS — C50919 Malignant neoplasm of unspecified site of unspecified female breast: Secondary | ICD-10-CM

## 2020-03-29 DIAGNOSIS — R978 Other abnormal tumor markers: Secondary | ICD-10-CM | POA: Insufficient documentation

## 2020-03-29 DIAGNOSIS — C7989 Secondary malignant neoplasm of other specified sites: Secondary | ICD-10-CM | POA: Insufficient documentation

## 2020-03-29 DIAGNOSIS — Z79899 Other long term (current) drug therapy: Secondary | ICD-10-CM | POA: Diagnosis not present

## 2020-03-29 DIAGNOSIS — Z5111 Encounter for antineoplastic chemotherapy: Secondary | ICD-10-CM | POA: Diagnosis not present

## 2020-03-29 DIAGNOSIS — C7951 Secondary malignant neoplasm of bone: Secondary | ICD-10-CM | POA: Insufficient documentation

## 2020-03-29 DIAGNOSIS — C44501 Unspecified malignant neoplasm of skin of breast: Secondary | ICD-10-CM | POA: Insufficient documentation

## 2020-03-29 DIAGNOSIS — C799 Secondary malignant neoplasm of unspecified site: Secondary | ICD-10-CM | POA: Diagnosis not present

## 2020-03-29 DIAGNOSIS — C50811 Malignant neoplasm of overlapping sites of right female breast: Secondary | ICD-10-CM

## 2020-03-29 DIAGNOSIS — C50911 Malignant neoplasm of unspecified site of right female breast: Secondary | ICD-10-CM | POA: Diagnosis not present

## 2020-03-29 DIAGNOSIS — M25511 Pain in right shoulder: Secondary | ICD-10-CM | POA: Diagnosis not present

## 2020-03-29 LAB — CBC WITH DIFFERENTIAL/PLATELET
Abs Immature Granulocytes: 0.03 10*3/uL (ref 0.00–0.07)
Basophils Absolute: 0.1 10*3/uL (ref 0.0–0.1)
Basophils Relative: 2 %
Eosinophils Absolute: 0.1 10*3/uL (ref 0.0–0.5)
Eosinophils Relative: 2 %
HCT: 38.9 % (ref 36.0–46.0)
Hemoglobin: 13 g/dL (ref 12.0–15.0)
Immature Granulocytes: 1 %
Lymphocytes Relative: 25 %
Lymphs Abs: 0.8 10*3/uL (ref 0.7–4.0)
MCH: 30.2 pg (ref 26.0–34.0)
MCHC: 33.4 g/dL (ref 30.0–36.0)
MCV: 90.3 fL (ref 80.0–100.0)
Monocytes Absolute: 0.5 10*3/uL (ref 0.1–1.0)
Monocytes Relative: 15 %
Neutro Abs: 1.8 10*3/uL (ref 1.7–7.7)
Neutrophils Relative %: 55 %
Platelets: 200 10*3/uL (ref 150–400)
RBC: 4.31 MIL/uL (ref 3.87–5.11)
RDW: 14.8 % (ref 11.5–15.5)
WBC: 3.3 10*3/uL — ABNORMAL LOW (ref 4.0–10.5)
nRBC: 0 % (ref 0.0–0.2)

## 2020-03-29 LAB — COMPREHENSIVE METABOLIC PANEL
ALT: 19 U/L (ref 0–44)
AST: 22 U/L (ref 15–41)
Albumin: 4.2 g/dL (ref 3.5–5.0)
Alkaline Phosphatase: 62 U/L (ref 38–126)
Anion gap: 8 (ref 5–15)
BUN: 13 mg/dL (ref 8–23)
CO2: 24 mmol/L (ref 22–32)
Calcium: 9.4 mg/dL (ref 8.9–10.3)
Chloride: 105 mmol/L (ref 98–111)
Creatinine, Ser: 0.88 mg/dL (ref 0.44–1.00)
GFR, Estimated: >60 mL/min (ref >60)
Glucose, Bld: 80 mg/dL (ref 70–99)
Potassium: 4.3 mmol/L (ref 3.5–5.1)
Sodium: 137 mmol/L (ref 135–145)
Total Bilirubin: 0.6 mg/dL (ref 0.3–1.2)
Total Protein: 6.9 g/dL (ref 6.5–8.1)

## 2020-03-29 MED ORDER — DENOSUMAB 120 MG/1.7ML ~~LOC~~ SOLN
SUBCUTANEOUS | Status: AC
  Filled 2020-03-29: qty 1.7

## 2020-03-29 MED ORDER — FULVESTRANT 250 MG/5ML IM SOLN
500.0000 mg | Freq: Once | INTRAMUSCULAR | Status: AC
Start: 2020-03-29 — End: 2020-03-29
  Administered 2020-03-29: 500 mg via INTRAMUSCULAR

## 2020-03-29 MED ORDER — DENOSUMAB 120 MG/1.7ML ~~LOC~~ SOLN
120.0000 mg | Freq: Once | SUBCUTANEOUS | Status: AC
  Administered 2020-03-29: 120 mg via SUBCUTANEOUS

## 2020-03-29 MED ORDER — FULVESTRANT 250 MG/5ML IM SOLN
INTRAMUSCULAR | Status: AC
  Filled 2020-03-29: qty 5

## 2020-03-29 NOTE — Progress Notes (Signed)
Jade [phone] [phone]   ID: Jade Mooney DOB: May 24, 1945  MR#: [id number]  NIO#:[id number]  Patient Care Team: Street, Jade Mt, MD as PCP - General (Family Medicine) Jade Essex, MD as Consulting Physician (Gastroenterology) Jade Mooney, Jade Dad, MD as Consulting Physician (Oncology) Jade Arabian, MD as Consulting Physician (Orthopedic Surgery) Jade Mayer, MD as Consulting Physician (Radiation Oncology) Jade Miss, MD as Consulting Physician (Neurosurgery) Jade Kaplan, MD as Consulting Physician (Oncology)   CHIEF COMPLAINT: metastatic breast cancer (s/p right mastectomy)  CURRENT TREATMENT: fulvestrant, denosumab/Xgeva, palbociclib   INTERVAL HISTORY: Jade Mooney returns today for follow-up and treatment of her estrogen receptor positive breast cancer unaccompanied  Her most recent restaging PET scan was completed on 01/03/2020 showing: no abnormal FDG uptake identified to suggest residual or recurrent metabolically active disease; widespread sclerotic bone masses without corresponding hypermetabolism, favored to represent treated bone metastases.  She continues on palbociclib/Ibrance at 75 mg every other day, 21 days on 7 days off.  She is due to start this today.  She also continues on fulvestrant, which she receives today and every 28 days.  Aside from the discomfort of the administration (which can last up to a week).  She toelrates this well.    Finally, she also continues on denosumab/Xgeva.  She receives that also every 28 days and again 2 out of every 3 doses she receives in [place] and every third dose including today's she receives it here. She has occasional bone pain, but denies any dental concerns.  REVIEW OF SYSTEMS: Jade Mooney is having an increased amount of soreness and the feeling of a "catch" when bending over and stretching her arm out.  She is noting an increased amount of pulling.  She saw  her PCP and used some voltaren gel on the area.  She notes it is worse over her mastectomy scar.  This has been going on since her surgery, but gradually worsening over the years.  Otherwise she is doing well today and a detailed ROS was non contributory.     BREAST CANCER HISTORY: From the earlier summary notes:  Jade Mooney was referred to Dr. Clarene Mooney for evaluation of abdominal discomfort and nausea. Exam and blood work including liver function tests was unremarkable aside from normochromic normocytic anemia. Cholecystitis was suspected and the patient underwent endoscopy followed by abdominal/ pelvic CT scan on 09/29/2013. This showed showed a normal gallbladder. Incidental findings included multiple simple cysts in the liver and aortic atherosclerosis. However mixed lytic and sclerotic bony lesions were noted. On 10/10/2013 the patient underwent biopsy of the right iliac bone, and this showed ([id number]) metastatic invasive ductal carcinoma, grade 2, estrogen and progesterone receptor strongly positive.  Jade Mooney has a remote history of breast cancer, which we tried to reconstruct. She was originally diagnosed early in 1997 with stage III disease, and underwent right mastectomy followed by adjuvant chemotherapy. She then participated in a [facility] protocol with high-dose chemotherapy followed by stem cell rescue 12/22/1995. Unfortunately later that year liver lesions were noted and were biopsy-proven to be metastatic breast cancer. This was not felt to be resectable. However the tumor was estrogen receptor positive and HER-2 positive. The patient was treated with Herceptin (she does not know for what period of time) and was then on tamoxifen until November of 2002. She also participated in a vaccine study at [facility].  Jade Mooney subsequent history is as detailed below   PAST MEDICAL HISTORY: Past Medical History:  Diagnosis Date  .  Anxiety   . Cancer [facility])    Breast 1997 right tx with mastectomy and  chemo, metastatic now  . GERD (gastroesophageal reflux disease)   . Hypercholesterolemia   . Hypertension   . Hypothyroidism   . Osteoarthritis    oa  . Personal history of chemotherapy   . Personal history of radiation therapy   . Secondary malignant neoplasm of bone and bone marrow ([place])   . TIA (transient ischemic attack) last 11-08-15   x 2 total    PAST SURGICAL HISTORY: Past Surgical History:  Procedure Laterality Date  . CATARACT EXTRACTION, BILATERAL    . MASTECTOMY MODIFIED RADICAL Right 1997  . porta cath insertion  1997   later removed  . radiation tx  finished 02-25-16   x 20 tx  . TONSILLECTOMY    . TOTAL HIP ARTHROPLASTY Left 03/12/2016   Procedure: LEFT TOTAL HIP ARTHROPLASTY ANTERIOR APPROACH;  Surgeon: Jade Arabian, MD;  Location: [facility];  Service: Orthopedics;  Laterality: Left;  . TOTAL KNEE ARTHROPLASTY Left   . TUBAL LIGATION      FAMILY HISTORY: Family History  Problem Relation Age of Onset  . Breast cancer Maternal Aunt    The patient's father died at the age of 66 from heart disease. The patient's mother died at the age of 36. The patient had no brothers, one sister. One of the patient's mother's 5 sisters was diagnosed with breast cancer in her 21s   GYNECOLOGIC HISTORY:  No LMP recorded. Patient is postmenopausal. Menarche age 55, the patient is GX P0. She stopped having periods approximately 1994. She used hormone replacement until 1997. 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 Her husband [name] (goes by [name]") was [employer]. of information all services at [employer]. They live alone, with no pets.   ADVANCED DIRECTIVES: In place   HEALTH MAINTENANCE: Social History   Tobacco Use  . Smoking status: Never Smoker  . Smokeless tobacco: Never Used  Substance Use Topics  . Alcohol use: Yes    Comment: 1 glass wine 4-5 x week  . Drug  use: No    Colonoscopy: 2013  PAP: 2010  Bone density: 2012  Lipid panel:  Allergies  Allergen Reactions  . Amoxicillin Hives    Has patient had a PCN reaction causing immediate rash, facial/tongue/throat swelling, SOB or lightheadedness with hypotension:unsure Has patient had a PCN reaction causing severe rash involving mucus membranes or skin necrosis:No Has patient had a PCN reaction that required hospitalization:No Has patient had a PCN reaction occurring within the last 10 years: Yes If all of the above answers are "NO", then may proceed with Cephalosporin use.   Marland Kitchen Percocet [Oxycodone-Acetaminophen] Nausea And Vomiting  . Percodan [Oxycodone-Aspirin] Nausea And Vomiting  . Tramadol Itching    Current Outpatient Medications  Medication Sig Dispense Refill  . acetaminophen (TYLENOL) 500 MG tablet Take 1,000 mg by mouth every 6 (six) hours as needed for mild pain. Reported on 04/17/2015    . beta carotene 10000 UNIT capsule Take 10,000 Units by mouth daily.    . calcium carbonate (OSCAL) 1500 (600 Ca) MG TABS tablet Take 600 mg of elemental calcium by mouth 2 (two) times daily with a meal.    . calcium gluconate 500 MG tablet Take 1 tablet by mouth 3 (three) times daily.    . cholecalciferol (VITAMIN D) 1000 units  tablet Take 1,000 Units by mouth daily.    . clopidogrel (PLAVIX) 75 MG tablet Take 1 tablet (75 mg total) by mouth daily.    . cyanocobalamin (,VITAMIN B-12,) 1000 MCG/ML injection Inject 1,000 mcg into the muscle once.    . Denosumab (XGEVA [place]) Inject into the skin.    . famotidine (PEPCID) 40 MG tablet Take 1 tablet (40 mg total) by mouth 2 (two) times daily.    . Fulvestrant (FASLODEX IM) Inject 500 mg into the muscle.    Marland Kitchen glucosamine-chondroitin 500-400 MG tablet Take 1 tablet by mouth 3 (three) times daily.    Marland Kitchen ketotifen (ZADITOR) 0.025 % ophthalmic solution 1 drop 2 (two) times daily.    Marland Kitchen lactobacillus acidophilus (BACID) TABS tablet Take 2 tablets by mouth 3  (three) times daily.    Marland Kitchen levothyroxine (SYNTHROID, LEVOTHROID) 88 MCG tablet Take 88 mcg by mouth daily before breakfast.    . Lutein 6 MG TABS Take 6 mg by mouth 2 (two) times daily.     . meclizine (ANTIVERT) 25 MG tablet Take 25 mg by mouth 3 (three) times daily as needed for dizziness.    Marland Kitchen METRONIDAZOLE, TOPICAL, 0.75 % LOTN Apply 1 application topically at bedtime. Apply to face for rosacea    . Multiple Vitamins-Minerals (MULTIVITAMIN WITH MINERALS) tablet Take 1 tablet by mouth daily.    . palbociclib (IBRANCE) 75 MG capsule Take 1 capsule (75 mg total) by mouth daily with breakfast. Take whole with food on days 1-21, every 28 days. 21 capsule 6  . pantoprazole (PROTONIX) 40 MG tablet Take 40 mg by mouth 2 (two) times daily. Before breakfast and before supper    . Polyethyl Glycol-Propyl Glycol 0.4-0.3 % SOLN Place 1-2 drops into both eyes 2 (two) times daily.    . rosuvastatin (CRESTOR) 20 MG tablet Take 1 tablet (20 mg total) by mouth daily.    Marland Kitchen zinc gluconate 50 MG tablet Take 50 mg by mouth daily.     No current facility-administered medications for this visit.    OBJECTIVE:  Vitals:   03/29/20 1055  BP: (!) 174/73  Pulse: 62  Resp: 18  Temp: (!) 97.2 F (36.2 C)  SpO2: 100%     Body mass index is 29.92 kg/m.    ECOG FS:1 - Symptomatic but completely ambulatory GENERAL: Patient is a well appearing female in no acute distress HEENT:  Sclerae anicteric.  Mask in place. Neck is supple.  NODES:  No cervical, supraclavicular, or axillary lymphadenopathy palpated.  BREAST EXAM:  Right breast s/p mastectomy, no sign of local tendreness.   LUNGS:  Clear to auscultation bilaterally.  No wheezes or rhonchi. HEART:  Regular rate and rhythm. No murmur appreciated. ABDOMEN:  Soft, nontender.  Positive, normoactive bowel sounds. No organomegaly palpated. MSK:  No focal spinal tenderness to palpation. +ttp in posterior acriomial space, and extending anteriorly into axilla, no ROM  difficulty EXTREMITIES:  No peripheral edema.   SKIN:  Clear with no obvious rashes or skin changes. No nail dyscrasia. NEURO:  Nonfocal. Well oriented.  Appropriate affect.     LAB RESULTS:  CMP     Component Value Date/Time   NA 137 03/29/2020 0933   NA 137 02/28/2020 0000   NA 138 02/09/2017 1213   K 4.3 03/29/2020 0933   K 3.9 02/09/2017 1213   CL 105 03/29/2020 0933   CO2 24 03/29/2020 0933   CO2 27 02/09/2017 1213   GLUCOSE 80 03/29/2020 0933  GLUCOSE 100 02/09/2017 1213   BUN 13 03/29/2020 0933   BUN 12 02/28/2020 0000   BUN 12.0 02/09/2017 1213   CREATININE 0.88 03/29/2020 0933   CREATININE 0.9 02/09/2017 1213   CALCIUM 9.4 03/29/2020 0933   CALCIUM 8.7 02/09/2017 1213   PROT 6.9 03/29/2020 0933   PROT 6.5 02/09/2017 1213   ALBUMIN 4.2 03/29/2020 0933   ALBUMIN 4.1 02/09/2017 1213   AST 22 03/29/2020 0933   AST 24 02/09/2017 1213   ALT 19 03/29/2020 0933   ALT 18 02/09/2017 1213   ALKPHOS 62 03/29/2020 0933   ALKPHOS 62 02/09/2017 1213   BILITOT 0.6 03/29/2020 0933   BILITOT 0.34 02/09/2017 1213   GFRNONAA >60 03/29/2020 0933   GFRAA >60 10/13/2019 1201   No results found for: SPEP  Lab Results  Component Value Date   WBC 3.3 (L) 03/29/2020   NEUTROABS 1.8 03/29/2020   HGB 13.0 03/29/2020   HCT 38.9 03/29/2020   MCV 90.3 03/29/2020   PLT 200 03/29/2020      Chemistry      Component Value Date/Time   NA 137 03/29/2020 0933   NA 137 02/28/2020 0000   NA 138 02/09/2017 1213   K 4.3 03/29/2020 0933   K 3.9 02/09/2017 1213   CL 105 03/29/2020 0933   CO2 24 03/29/2020 0933   CO2 27 02/09/2017 1213   BUN 13 03/29/2020 0933   BUN 12 02/28/2020 0000   BUN 12.0 02/09/2017 1213   CREATININE 0.88 03/29/2020 0933   CREATININE 0.9 02/09/2017 1213   GLU 91 02/28/2020 0000      Component Value Date/Time   CALCIUM 9.4 03/29/2020 0933   CALCIUM 8.7 02/09/2017 1213   ALKPHOS 62 03/29/2020 0933   ALKPHOS 62 02/09/2017 1213   AST 22 03/29/2020 0933    AST 24 02/09/2017 1213   ALT 19 03/29/2020 0933   ALT 18 02/09/2017 1213   BILITOT 0.6 03/29/2020 0933   BILITOT 0.34 02/09/2017 1213      No results for input(s): INR in the last 168 hours.  Urinalysis    Component Value Date/Time   COLORURINE STRAW (A) 03/05/2016 1350    STUDIES:  No results found.   ASSESSMENT: 75 y.o. [place] woman with stage IV right breast cancer as of NOV 2007 (bone metastases, subcutaneous nodules)  (1) s/p Right mastectomy 1997 for an estrogen receptor and HER-2 positive breast cancer, treated with  (a) adjuvant chemotherapy according to CALGB 9640 (taxotere x 4 cycles)  (b) high-dose chemotherapy (cyclophosphamide, carboplatin, BCNU) followed by stem cell rescue at [facility] 12/22/1995  (c) biopsy-proven metastatic disease November 2007, estrogen receptor and HER-2 positive  (d) trastuzumab (dose? Duration?)  (e) tamoxifen for 5 years completed 2002  (f) participation in a vaccine study at [facility] 2003 (CEA primed dendritic cells, HER-2 primed dendritic cells)  (2) status post right iliac crest biopsy 10/10/2013 for invasive ductal carcinoma, grade 2, estrogen and progesterone receptor positive  (3) zoledronate started 10/12/2013,  repeated every 12 weeks  (a) dexa scan 07/28/2013 normal  (b) zolendronate discontinued after January 2019 dose  (4) biopsy of a scalp lesion 10/17/2013 showed metastatic breast cancer, HER-2/neu negative  (a) biopsy of a right anterior chest wall nodule August 2017 shows adenocarcinoma  (5) anastrozole started 10/28/2013, changed to letrozole 06/27/2016  (a) measurable disease = subcutaneous nodules in scalp and LLQ abdomen  (b)  CA-27-29 is informative  (c)  Repeat PET scan 12/28/2014 shows continuing response ; exam shows  resolution of scalp lesions  (d) new skin lesion removed from right anterior chest wall August 2017  (e) status post radiation to the anterior lower right chest wall completed 02/25/2017 (20 sessions)    (f) PET scan 04/04/2016 shows 2 new foci of metabolic activity (T8 and manubrium)  (g) palbociclib added 04/21/2016 at 125 mg 21/7  (h) palbociclib dose dropped to 100 mg 21/7 starting with the March 2018 cycle  (i) bone scan 09/26/2016 is stable  (j) PET scan 02/03/2017 shows her bone metastases, and in addition a 0.5 cm right middle lobe nodule which may be new  (k) MRI of the cervical spine shows collapse of the left lateral mass of C3.  There is no soft tissue mass or focal neural impingement.  (l) palbociclib reduced to 75 mg as of September 2018  (m) palbociclib held during cervical spine irradiation finished in mid February, 2019  (n) letrozole discontinued February 2019 secondary to likely progression   (6) neoplasia associated pain  (a) painful blastic lesion left femoral intertrochanteric region: Status post radiation completed November 2015  (b) pain left knee, status post TKR remotely  (c) left total hip replacement 03/12/2016  (7) palliative radiation to the cervical spine completed 05/08/2017 in [place]  (8) fulvestrant started 05/04/2017  (a) Palbociclib resumed 06/29/2017, currently at 75 mg every other day, 21 days on 7 days off  (9) denosumab/Xgeva started 06/01/2017, repeated every 28 days  (10) follow-up/staging studies  (a) PET scan 08/11/2017 shows no evidence for metabolically active tumor.  Sclerotic bone mets do not show hypermetabolism  (b) PET scan 02/11/2018 shows continuing excellent tumor control  (c) PET scan 08/05/2018 shows same bone lesions but no active disease  (d) PET scan 01/18/2019 finds stable disease  (e) PET scan 01/03/2020 finds no evidence of disease activity.   PLAN: Jade Mooney is here today for follow up of her metastatic breast cancer.  She has no clinical sign of progression.  She will cotninue on her treatment with fulvestrant, xgeva and Palbociclib.  She is tolerating these quite well.    She is having shoulder pain ? Related to scar  tissue.  I have requested she undergo xrays after her injections today and we will refer her to PT for evaluation.  She may need MRI, and we will order that if indicated.  She will return to [place] in 4 and 8 weeks and will see [place] back in 12 weeks for labs, f/u and her next injection.  She knows to call for any questions that may arise between now and her next appointment.  We are happy to see her sooner if needed.   Total encounter time 30 minutes.Wilber Bihari, NP 03/29/20 11:12 AM Medical Oncology and Hematology [facility] [place], [place] [address] Tel. [phone]    Fax. [phone]    *Total Encounter Time as defined by the Centers for Medicare and Medicaid Services includes, in addition to the face-to-face time of a patient visit (documented in the note above) non-face-to-face time: obtaining and reviewing outside history, ordering and reviewing medications, tests or procedures, care coordination (communications with other health care professionals or caregivers) and documentation in the medical record.

## 2020-03-29 NOTE — Patient Instructions (Signed)
Denosumab injection What is this medicine? DENOSUMAB (den oh sue mab) slows bone breakdown. Prolia is used to treat osteoporosis in women after menopause and in men, and in people who are taking corticosteroids for 6 months or more. Xgeva is used to treat a high calcium level due to cancer and to prevent bone fractures and other bone problems caused by multiple myeloma or cancer bone metastases. Xgeva is also used to treat giant cell tumor of the bone. This medicine may be used for other purposes; ask your health care provider or pharmacist if you have questions. COMMON BRAND NAME(S): Prolia, XGEVA What should I tell my health care provider before I take this medicine? They need to know if you have any of these conditions:  dental disease  having surgery or tooth extraction  infection  kidney disease  low levels of calcium or Vitamin D in the blood  malnutrition  on hemodialysis  skin conditions or sensitivity  thyroid or parathyroid disease  an unusual reaction to denosumab, other medicines, foods, dyes, or preservatives  pregnant or trying to get pregnant  breast-feeding How should I use this medicine? This medicine is for injection under the skin. It is given by a health care professional in a hospital or clinic setting. A special MedGuide will be given to you before each treatment. Be sure to read this information carefully each time. For Prolia, talk to your pediatrician regarding the use of this medicine in children. Special care may be needed. For Xgeva, talk to your pediatrician regarding the use of this medicine in children. While this drug may be prescribed for children as young as 13 years for selected conditions, precautions do apply. Overdosage: If you think you have taken too much of this medicine contact a poison control center or emergency room at once. NOTE: This medicine is only for you. Do not share this medicine with others. What if I miss a dose? It is  important not to miss your dose. Call your doctor or health care professional if you are unable to keep an appointment. What may interact with this medicine? Do not take this medicine with any of the following medications:  other medicines containing denosumab This medicine may also interact with the following medications:  medicines that lower your chance of fighting infection  steroid medicines like prednisone or cortisone This list may not describe all possible interactions. Give your health care provider a list of all the medicines, herbs, non-prescription drugs, or dietary supplements you use. Also tell them if you smoke, drink alcohol, or use illegal drugs. Some items may interact with your medicine. What should I watch for while using this medicine? Visit your doctor or health care professional for regular checks on your progress. Your doctor or health care professional may order blood tests and other tests to see how you are doing. Call your doctor or health care professional for advice if you get a fever, chills or sore throat, or other symptoms of a cold or flu. Do not treat yourself. This drug may decrease your body's ability to fight infection. Try to avoid being around people who are sick. You should make sure you get enough calcium and vitamin D while you are taking this medicine, unless your doctor tells you not to. Discuss the foods you eat and the vitamins you take with your health care professional. See your dentist regularly. Brush and floss your teeth as directed. Before you have any dental work done, tell your dentist you are   receiving this medicine. Do not become pregnant while taking this medicine or for 5 months after stopping it. Talk with your doctor or health care professional about your birth control options while taking this medicine. Women should inform their doctor if they wish to become pregnant or think they might be pregnant. There is a potential for serious side  effects to an unborn child. Talk to your health care professional or pharmacist for more information. What side effects may I notice from receiving this medicine? Side effects that you should report to your doctor or health care professional as soon as possible:  allergic reactions like skin rash, itching or hives, swelling of the face, lips, or tongue  bone pain  breathing problems  dizziness  jaw pain, especially after dental work  redness, blistering, peeling of the skin  signs and symptoms of infection like fever or chills; cough; sore throat; pain or trouble passing urine  signs of low calcium like fast heartbeat, muscle cramps or muscle pain; pain, tingling, numbness in the hands or feet; seizures  unusual bleeding or bruising  unusually weak or tired Side effects that usually do not require medical attention (report to your doctor or health care professional if they continue or are bothersome):  constipation  diarrhea  headache  joint pain  loss of appetite  muscle pain  runny nose  tiredness  upset stomach This list may not describe all possible side effects. Call your doctor for medical advice about side effects. You may report side effects to FDA at 1-800-FDA-1088. Where should I keep my medicine? This medicine is only given in a clinic, doctor's office, or other health care setting and will not be stored at home. NOTE: This sheet is a summary. It may not cover all possible information. If you have questions about this medicine, talk to your doctor, pharmacist, or health care provider.  2020 [employer] Standard (2017-07-17 16:10:44) Fulvestrant injection What is this medicine? FULVESTRANT (ful VES trant) blocks the effects of estrogen. It is used to treat breast cancer. This medicine may be used for other purposes; ask your health care provider or pharmacist if you have questions. COMMON BRAND NAME(S): FASLODEX What should I tell my health care  provider before I take this medicine? They need to know if you have any of these conditions:  bleeding disorders  liver disease  low blood counts, like low white cell, platelet, or red cell counts  an unusual or allergic reaction to fulvestrant, other medicines, foods, dyes, or preservatives  pregnant or trying to get pregnant  breast-feeding How should I use this medicine? This medicine is for injection into a muscle. It is usually given by a health care professional in a hospital or clinic setting. Talk to your pediatrician regarding the use of this medicine in children. Special care may be needed. Overdosage: If you think you have taken too much of this medicine contact a poison control center or emergency room at once. NOTE: This medicine is only for you. Do not share this medicine with others. What if I miss a dose? It is important not to miss your dose. Call your doctor or health care professional if you are unable to keep an appointment. What may interact with this medicine?  medicines that treat or prevent blood clots like warfarin, enoxaparin, dalteparin, apixaban, dabigatran, and rivaroxaban This list may not describe all possible interactions. Give your health care provider a list of all the medicines, herbs, non-prescription drugs, or dietary supplements you use.   Also tell them if you smoke, drink alcohol, or use illegal drugs. Some items may interact with your medicine. What should I watch for while using this medicine? Your condition will be monitored carefully while you are receiving this medicine. You will need important blood work done while you are taking this medicine. Do not become pregnant while taking this medicine or for at least 1 year after stopping it. Women of child-bearing potential will need to have a negative pregnancy test before starting this medicine. Women should inform their doctor if they wish to become pregnant or think they might be pregnant. There is  a potential for serious side effects to an unborn child. Men should inform their doctors if they wish to father a child. This medicine may lower sperm counts. Talk to your health care professional or pharmacist for more information. Do not breast-feed an infant while taking this medicine or for 1 year after the last dose. What side effects may I notice from receiving this medicine? Side effects that you should report to your doctor or health care professional as soon as possible:  allergic reactions like skin rash, itching or hives, swelling of the face, lips, or tongue  feeling faint or lightheaded, falls  pain, tingling, numbness, or weakness in the legs  signs and symptoms of infection like fever or chills; cough; flu-like symptoms; sore throat  vaginal bleeding Side effects that usually do not require medical attention (report to your doctor or health care professional if they continue or are bothersome):  aches, pains  constipation  diarrhea  headache  hot flashes  nausea, vomiting  pain at site where injected  stomach pain This list may not describe all possible side effects. Call your doctor for medical advice about side effects. You may report side effects to FDA at 1-800-FDA-1088. Where should I keep my medicine? This drug is given in a hospital or clinic and will not be stored at home. NOTE: This sheet is a summary. It may not cover all possible information. If you have questions about this medicine, talk to your doctor, pharmacist, or health care provider.  2020 [employer]/Gold Standard (2017-06-18 11:34:41)  

## 2020-03-29 NOTE — Telephone Encounter (Signed)
Oral Chemotherapy Pharmacist Encounter  Dispensed samples to patient:  Medication: Ibrance 75 mg tablets Instructions: Take 1 tablet by mouth every other day for 21 days on, 7 days off, repeat every 28 days Quantity dispensed: 63 Days supply: 160 Manufacturer: Pfizer Lot: [id number] Exp: 09/20/2020  Lenord Carbo, PharmD, BCPS Hematology/Oncology Clinical Pharmacist [facility] [phone] 03/29/2020 9:59 AM

## 2020-03-30 LAB — CANCER ANTIGEN 27.29: CA 27.29: 30.8 U/mL (ref 0.0–38.6)

## 2020-04-02 ENCOUNTER — Telehealth: Payer: Self-pay | Admitting: Adult Health

## 2020-04-02 NOTE — Telephone Encounter (Signed)
No 1/6 los. No changes made to pt's schedule.  

## 2020-04-03 ENCOUNTER — Ambulatory Visit: Payer: Medicare Other | Attending: Adult Health | Admitting: Physical Therapy

## 2020-04-03 ENCOUNTER — Encounter: Payer: Self-pay | Admitting: Physical Therapy

## 2020-04-03 ENCOUNTER — Other Ambulatory Visit: Payer: Self-pay

## 2020-04-03 DIAGNOSIS — M25511 Pain in right shoulder: Secondary | ICD-10-CM | POA: Diagnosis not present

## 2020-04-03 DIAGNOSIS — L599 Disorder of the skin and subcutaneous tissue related to radiation, unspecified: Secondary | ICD-10-CM | POA: Diagnosis not present

## 2020-04-03 DIAGNOSIS — M6281 Muscle weakness (generalized): Secondary | ICD-10-CM | POA: Insufficient documentation

## 2020-04-03 DIAGNOSIS — M25611 Stiffness of right shoulder, not elsewhere classified: Secondary | ICD-10-CM | POA: Insufficient documentation

## 2020-04-03 DIAGNOSIS — R293 Abnormal posture: Secondary | ICD-10-CM | POA: Diagnosis not present

## 2020-04-03 NOTE — Therapy (Signed)
[place], [place], [address] Phone: [phone]   Fax:  [phone]  Physical Therapy Evaluation  Patient Details  Name: Jade Mooney MRN: [id number] Date of Birth: 1945/04/12 Referring Provider (PT): Causey   Encounter Date: 04/03/2020   PT End of Session - 04/03/20 1158    Visit Number 1    Number of Visits 9    Date for PT Re-Evaluation 05/01/20    PT Start Time 1100    PT Stop Time 1155    PT Time Calculation (min) 55 min    Activity Tolerance Patient tolerated treatment well    Behavior During Therapy [facility] for tasks assessed/performed           Past Medical History:  Diagnosis Date  . Anxiety   . Cancer [facility])    Breast 1997 right tx with mastectomy and chemo, metastatic now  . GERD (gastroesophageal reflux disease)   . Hypercholesterolemia   . Hypertension   . Hypothyroidism   . Osteoarthritis    oa  . Personal history of chemotherapy   . Personal history of radiation therapy   . Secondary malignant neoplasm of bone and bone marrow ([place])   . TIA (transient ischemic attack) last 11-08-15   x 2 total    Past Surgical History:  Procedure Laterality Date  . CATARACT EXTRACTION, BILATERAL    . MASTECTOMY MODIFIED RADICAL Right 1997  . porta cath insertion  1997   later removed  . radiation tx  finished 02-25-16   x 20 tx  . TONSILLECTOMY    . TOTAL HIP ARTHROPLASTY Left 03/12/2016   Procedure: LEFT TOTAL HIP ARTHROPLASTY ANTERIOR APPROACH;  Surgeon: Gaynelle Arabian, MD;  Location: [facility];  Service: Orthopedics;  Laterality: Left;  . TOTAL KNEE ARTHROPLASTY Left   . TUBAL LIGATION      There were no vitals filed for this visit.    Subjective Assessment - 04/03/20 1102    Subjective It is not really my shoulder that is bothering me but more the back and under my breast. My ROM does not seem to be limited. I can have pain when leaving forward or when I reach back. It just  can happen out of the blue. The pain pain is directly under my breast.    Pertinent History s/p Right mastectomy 1997 for an estrogen receptor and HER-2 positive breast cancer, treated with adjuvant chemotherapy according to CALGB 9640 (taxotere x 4 cycles), high-dose chemotherapy (cyclophosphamide, carboplatin, BCNU) followed by stem cell rescue at [facility] 12/21/17, biopsy-proven metastatic disease November 2007, estrogen receptor and HER-2 positive, tamoxifen completed 2002, status post right iliac crest biopsy 10/10/2013 for invasive ductal carcinoma, grade 2, estrogen and progesterone receptor positive, zoledronate started 10/12/2013,  repeated every 12 weeks, biopsy of a scalp lesion 10/17/2013 showed metastatic breast cancer, HER-2/neu negative, biopsy of a right anterior chest wall nodule August 2017 shows adenocarcinoma, anastrozole started 10/28/2013, changed to letrozole 06/27/2016, new skin lesion removed from right anterior chest wall August 2017, status post radiation to the anterior lower right chest wall completed 02/25/2017 (20 sessions) PET scan 02/03/2017 shows her bone metastases, and in addition a 0.5 cm right middle lobe nodule which may be new, MRI of the cervical spine shows collapse of the left lateral mass of C3.  There is no soft tissue mass or focal neural impingement. palbociclib reduced to 75 mg as of September 2018, palbociclib held during cervical spine irradiation finished in mid February, 201l,  letrozole discontinued February 2019 secondary to likely progression, neoplasia associated pain, painful blastic lesion left femoral intertrochanteric region: Status post radiation completed November 2015, pain left knee, status post TKR remotely, left total hip replacement 03/13/19, palliative radiation to the cervical spine completed 05/08/2017 in [place], PET scan 01/03/2020 finds no evidence of disease activity    Patient Stated Goals to get rid of the pain under the breast    Currently  in Pain? No/denies    Pain Score 0-No pain              [facility] PT Assessment - 04/03/20 0001      Assessment   Medical Diagnosis metastatic breast cancer    Referring Provider (PT) Causey    Onset Date/Surgical Date 05/23/95    Hand Dominance Right    Prior Therapy PT for L TKA in 2007, L THA in 2017      Precautions   Precautions Anterior Hip;Other (comment)   bone mets     Restrictions   Weight Bearing Restrictions No      Balance Screen   Has the patient fallen in the past 6 months No    Has the patient had a decrease in activity level because of a fear of falling?  No    Is the patient reluctant to leave their home because of a fear of falling?  No      Home [employer]ist information centre manager residence    Living Arrangements Spouse/significant other    Available Help at Discharge Family    Type of Home House      Prior Function   Level of Independence Independent    Vocation Retired    Leisure pt does not currently exercise      Cognition   Overall Cognitive Status Within Functional Limits for tasks assessed      Observation/Other Assessments   Observations R scapula is more abducted and depressed, area where pain is on R inferior chest has telangiectasia and scar tissue present      Posture/Postural Control   Posture/Postural Control Postural limitations    Postural Limitations Rounded Shoulders;Forward head      ROM / Strength   AROM / PROM / Strength AROM      AROM   AROM Assessment Site Shoulder    Right/Left Shoulder Left;Right    Right Shoulder Flexion 144 Degrees    Right Shoulder ABduction 138 Degrees    Right Shoulder Internal Rotation 43 Degrees    Right Shoulder External Rotation 79 Degrees    Left Shoulder Flexion 146 Degrees    Left Shoulder ABduction 151 Degrees    Left Shoulder Internal Rotation 54 Degrees    Left Shoulder External Rotation 83 Degrees             LYMPHEDEMA/ONCOLOGY QUESTIONNAIRE - 04/03/20 0001      Type    Cancer Type metastatic breast cancer      Surgeries   Mastectomy Date 05/23/95    Axillary Lymph Node Dissection Date 05/23/95    Number Lymph Nodes Removed 11   pt is not sure exactly but states " a lot"     Treatment   Active Chemotherapy Treatment Yes    Past Chemotherapy Treatment Yes    Past Radiation Treatment Yes    Body Site chest, cervical spine, L hip, L knee      Lymphedema Assessments   Lymphedema Assessments Upper extremities      Right Upper Extremity Lymphedema  15 cm Proximal to Olecranon Process 30.7 cm    Olecranon Process 23.5 cm    15 cm Proximal to Ulnar Styloid Process 23.5 cm    Just Proximal to Ulnar Styloid Process 14.8 cm    Across Hand at [employer] 18.2 cm    At [place] of 2nd Digit 6 cm      Left Upper Extremity Lymphedema   15 cm Proximal to Olecranon Process 30.5 cm    Olecranon Process 23.3 cm    15 cm Proximal to Ulnar Styloid Process 23 cm    Just Proximal to Ulnar Styloid Process 14.6 cm    Across Hand at [employer] 17 cm    At [place] of 2nd Digit 5.5 cm                   Objective measurements completed on examination: See above findings.                    PT Long Term Goals - 04/03/20 1203      PT LONG TERM GOAL #1   Title Pt will report 75% improvement in pain in inferior right chest to allow improved comfort.    Time 4    Period Weeks    Status New    Target Date 05/01/20      PT LONG TERM GOAL #2   Title Pt will demonstrate 150 degrees of right shoulder abduction to allow her to reach out to the sides without limitation by tight pec muscle.    Baseline 138    Time 4    Period Weeks    Status New    Target Date 05/01/20      PT LONG TERM GOAL #3   Title Pt will be independent in a home exercise program for continued strengthening and stretching.    Time 4    Period Weeks    Status New    Target Date 05/01/20      PT LONG TERM GOAL #4   Title Pt will demonstrate improved right  scapular positioning for optimal R shoulder function.    Baseline scapula is abducted and depressed    Time 4    Period Weeks    Status New    Target Date 05/01/20                  Plan - 04/03/20 1206    Clinical Impression Statement Pt presents to PT with R upper quadrant pain mainly in area of rhomboids and inferior chest. She reports the pain at her inferior chest grabs her when she reaches in certain directions and when she leans down and sometimes even at rest. There is increased scar tissue here as well as telangiectasia. There is also increased muscle tightness in area of rhomboids. Pt's R scapula is more abducted and depressed than her left and she has decreased R shoulder abduction. Her R pec is very tight with numerous trigger points noted. Pt would benefit from skilled PT services to increase R shoulder ROM, decrease pain and improve muscle length of pec as well as gently strengthening R scapular muscles.    Personal Factors and Comorbidities Age;Fitness;Comorbidity 1;Time since onset of injury/illness/exacerbation;Comorbidity 2    Comorbidities radiation to 4 different areas, bone mets, osteoarthritis    Examination-Activity Limitations Sleep;Reach Overhead;Carry;Lift;Dressing    Examination-Participation Restrictions Laundry;Cleaning;Shop;Meal Prep    Stability/Clinical Decision Making Stable/Uncomplicated    Clinical Decision Making Low  Rehab Potential Good    PT Frequency 2x / week    PT Duration 4 weeks    PT Treatment/Interventions ADLs/Self Care Home Management;Therapeutic exercise;Therapeutic activities;Patient/family education;Manual techniques;Scar mobilization;Passive range of motion;Taping    PT Next Visit Plan begin gentle strengthening of R scap - no resistance due to bone mets, STM to inferior R chest and R rhomboids, stretching R pec    PT Home Exercise Plan scapular squeezes    Consulted and Agree with Plan of Care Patient           Patient will  benefit from skilled therapeutic intervention in order to improve the following deficits and impairments:  Pain,Postural dysfunction,Increased fascial restricitons,Decreased strength,Decreased range of motion,Decreased scar mobility  Visit Diagnosis: Acute pain of right shoulder  Disorder of the skin and subcutaneous tissue related to radiation, unspecified  Stiffness of right shoulder, not elsewhere classified  Muscle weakness (generalized)  Abnormal posture     Problem List Patient Active Problem List   Diagnosis Date Noted  . Aortic atherosclerosis ([place]) 08/24/2017  . Malignant neoplasm of overlapping sites of right breast in female, estrogen receptor positive ([place]) 04/21/2016  . Goals of care, counseling/discussion 04/21/2016  . OA (osteoarthritis) of hip 03/12/2016  . Pain from bone metastases ([place]) 10/02/2015  . Metastatic breast cancer ([place]) 07/12/2015  . Breast cancer metastasized to bone ([place]) 10/12/2013  . TIA (transient ischemic attack)     Allyson Sabal [facility] 04/03/2020, 12:15 PM  [place], [place], [address] Phone: [phone]   Fax:  [phone]  Name: Jade Mooney MRN: [id number] Date of Birth: Jul 02, 1945  Manus Gunning, PT 04/03/20 12:15 PM

## 2020-04-05 ENCOUNTER — Encounter: Payer: Self-pay | Admitting: Physical Therapy

## 2020-04-05 ENCOUNTER — Other Ambulatory Visit: Payer: Self-pay

## 2020-04-05 ENCOUNTER — Ambulatory Visit: Payer: Medicare Other | Admitting: Physical Therapy

## 2020-04-05 DIAGNOSIS — L599 Disorder of the skin and subcutaneous tissue related to radiation, unspecified: Secondary | ICD-10-CM

## 2020-04-05 DIAGNOSIS — M6281 Muscle weakness (generalized): Secondary | ICD-10-CM | POA: Diagnosis not present

## 2020-04-05 DIAGNOSIS — M25611 Stiffness of right shoulder, not elsewhere classified: Secondary | ICD-10-CM | POA: Diagnosis not present

## 2020-04-05 DIAGNOSIS — R293 Abnormal posture: Secondary | ICD-10-CM | POA: Diagnosis not present

## 2020-04-05 DIAGNOSIS — M25511 Pain in right shoulder: Secondary | ICD-10-CM

## 2020-04-05 NOTE — Therapy (Addendum)
[place], [place], [address] Phone: [phone]   Fax:  [phone]  Physical Therapy Treatment  Patient Details  Name: Jade Mooney MRN: [id number] Date of Birth: [birth year] Referring Provider (PT): Causey   Encounter Date: 04/05/2020   PT End of Session - 04/05/20 1000    Visit Number 2    Number of Visits 9    Date for PT Re-Evaluation 05/01/20    PT Start Time 0905    PT Stop Time 0956    PT Time Calculation (min) 51 min    Activity Tolerance Patient tolerated treatment well    Behavior During Therapy [facility] for tasks assessed/performed           Past Medical History:  Diagnosis Date  . Anxiety   . Cancer [facility])    Breast 1997 right tx with mastectomy and chemo, metastatic now  . GERD (gastroesophageal reflux disease)   . Hypercholesterolemia   . Hypertension   . Hypothyroidism   . Osteoarthritis    oa  . Personal history of chemotherapy   . Personal history of radiation therapy   . Secondary malignant neoplasm of bone and bone marrow ([place])   . TIA (transient ischemic attack) last 11-08-15   x 2 total    Past Surgical History:  Procedure Laterality Date  . CATARACT EXTRACTION, BILATERAL    . MASTECTOMY MODIFIED RADICAL Right 1997  . porta cath insertion  1997   later removed  . radiation tx  finished 02-25-16   x 20 tx  . TONSILLECTOMY    . TOTAL HIP ARTHROPLASTY Left 03/12/2016   Procedure: LEFT TOTAL HIP ARTHROPLASTY ANTERIOR APPROACH;  Surgeon: Gaynelle Arabian, MD;  Location: [facility];  Service: Orthopedics;  Laterality: Left;  . TOTAL KNEE ARTHROPLASTY Left   . TUBAL LIGATION      There were no vitals filed for this visit.   Subjective Assessment - 04/05/20 0906    Subjective I did not have any spasms yesterday.    Pertinent History s/p Right mastectomy 1997 for an estrogen receptor and HER-2 positive breast cancer, treated with adjuvant chemotherapy according to  CALGB 9640 (taxotere x 4 cycles), high-dose chemotherapy (cyclophosphamide, carboplatin, BCNU) followed by [facility] cell rescue at [facility] 12/21/17, biopsy-proven metastatic disease November 2007, estrogen receptor and HER-2 positive, tamoxifen completed 2002, status post right iliac crest biopsy 10/10/2013 for invasive ductal carcinoma, grade 2, estrogen and progesterone receptor positive, zoledronate started 10/12/2013,  repeated every 12 weeks, biopsy of a scalp lesion 10/17/2013 showed metastatic breast cancer, HER-2/neu negative, biopsy of a right anterior chest wall nodule August 2017 shows adenocarcinoma, anastrozole started 10/28/2013, changed to letrozole 06/27/2016, new skin lesion removed from right anterior chest wall August 2017, status post radiation to the anterior lower right chest wall completed 02/25/2017 (20 sessions) PET scan 02/03/2017 shows her bone metastases, and in addition a 0.5 cm right middle lobe nodule which may be new, MRI of the cervical spine shows collapse of the left lateral mass of C3.  There is no soft tissue mass or focal neural impingement. palbociclib reduced to 75 mg as of September 2018, palbociclib held during cervical spine irradiation finished in mid February, 201l, letrozole discontinued February 2019 secondary to likely progression, neoplasia associated pain, painful blastic lesion left femoral intertrochanteric region: Status post radiation completed November 2015, pain left knee, status post TKR remotely, left total hip replacement 02/2016, palliative radiation to the cervical spine completed 05/08/2017 in [place], PET scan  01/03/2020 finds no evidence of disease activity    Patient Stated Goals to get rid of the pain under the breast    Currently in Pain? No/denies    Pain Score 0-No pain                             [facility] Adult PT Treatment/Exercise - 04/05/20 0001      Manual Therapy   Manual Therapy Soft tissue mobilization;Passive ROM     Soft tissue mobilization in supine using cocoa butter to R pec muscle, area of scar tissue where pt had previous radiation at inferior chest and to R chest wall in area of tightness- numerous areas of tightness and discomfort palpated today    Passive ROM to R shoulder in to abduction briefly to increase stretch across R pec                       PT Long Term Goals - 04/03/20 1203      PT LONG TERM GOAL #1   Title Pt will report 75% improvement in pain in inferior right chest to allow improved comfort.    Time 4    Period Weeks    Status New    Target Date 05/01/20      PT LONG TERM GOAL #2   Title Pt will demonstrate 150 degrees of right shoulder abduction to allow her to reach out to the sides without limitation by tight pec muscle.    Baseline 138    Time 4    Period Weeks    Status New    Target Date 05/01/20      PT LONG TERM GOAL #3   Title Pt will be independent in a home exercise program for continued strengthening and stretching.    Time 4    Period Weeks    Status New    Target Date 05/01/20      PT LONG TERM GOAL #4   Title Pt will demonstrate improved right scapular positioning for optimal R shoulder function.    Baseline scapula is abducted and depressed    Time 4    Period Weeks    Status New    Target Date 05/01/20                 Plan - 04/05/20 1001    Clinical Impression Statement Began soft tissue moblization to R pec and to scar tissue at inferior chest from previous radiation. Pt had increased tightness and discomfort in this area but this did ease with massage. Educated pt to be mindful of how many spasms she has prior to her next session and how her pain is following to soft tissue mobilization. Also educated pt about proper shoulder positioning and trying to improve R scapular retraction.    PT Frequency 2x / week    PT Duration 4 weeks    PT Treatment/Interventions ADLs/Self Care Home Management;Therapeutic exercise;Therapeutic  activities;Patient/family education;Manual techniques;Scar mobilization;Passive range of motion;Taping    PT Next Visit Plan begin gentle strengthening of R scap - no resistance due to bone mets, STM to inferior R chest and R rhomboids, stretching R pec    PT Home Exercise Plan scapular squeezes    Consulted and Agree with Plan of Care Patient           Patient will benefit from skilled therapeutic intervention in order to improve the following deficits and  impairments:  Pain,Postural dysfunction,Increased fascial restricitons,Decreased strength,Decreased range of motion,Decreased scar mobility  Visit Diagnosis: Disorder of the skin and subcutaneous tissue related to radiation, unspecified  Acute pain of right shoulder     Problem List Patient Active Problem List   Diagnosis Date Noted  . Aortic atherosclerosis ([place]) 08/24/2017  . Malignant neoplasm of overlapping sites of right breast in female, estrogen receptor positive ([place]) 04/21/2016  . Goals of care, counseling/discussion 04/21/2016  . OA (osteoarthritis) of hip 03/12/2016  . Pain from bone metastases ([place]) 10/02/2015  . Metastatic breast cancer ([place]) 07/12/2015  . Breast cancer metastasized to bone ([place]) 10/12/2013  . TIA (transient ischemic attack)     Allyson Sabal [facility] 04/05/2020, 10:03 AM  [place], [place], [address] Phone: [phone]   Fax:  [phone]  Name: Jade Mooney MRN: [id number] Date of Birth: 1946/02/03  Manus Gunning, PT 04/05/20 10:03 AM

## 2020-04-06 DIAGNOSIS — D519 Vitamin B12 deficiency anemia, unspecified: Secondary | ICD-10-CM | POA: Diagnosis not present

## 2020-04-10 ENCOUNTER — Other Ambulatory Visit: Payer: Self-pay

## 2020-04-10 ENCOUNTER — Ambulatory Visit: Payer: Medicare Other | Admitting: Physical Therapy

## 2020-04-10 ENCOUNTER — Encounter: Payer: Self-pay | Admitting: Physical Therapy

## 2020-04-10 DIAGNOSIS — M25611 Stiffness of right shoulder, not elsewhere classified: Secondary | ICD-10-CM

## 2020-04-10 DIAGNOSIS — M6281 Muscle weakness (generalized): Secondary | ICD-10-CM | POA: Diagnosis not present

## 2020-04-10 DIAGNOSIS — R293 Abnormal posture: Secondary | ICD-10-CM | POA: Diagnosis not present

## 2020-04-10 DIAGNOSIS — L599 Disorder of the skin and subcutaneous tissue related to radiation, unspecified: Secondary | ICD-10-CM

## 2020-04-10 DIAGNOSIS — M25511 Pain in right shoulder: Secondary | ICD-10-CM | POA: Diagnosis not present

## 2020-04-10 NOTE — Patient Instructions (Signed)
Over Head Pull: Narrow and Wide Grip   Cancer Rehab [phone]   On back, knees bent, feet flat, band across thighs, elbows straight but relaxed. Pull hands apart (start). Keeping elbows straight, bring arms up and over head, hands toward floor. Keep pull steady on band. Hold momentarily. Return slowly, keeping pull steady, back to start. Then do same with a wider grip on the band (past shoulder width) Repeat _10__ times. Band color __yellow____   Side Pull: Double Arm   On back, knees bent, feet flat. Arms perpendicular to body, shoulder level, elbows straight but relaxed. Pull arms out to sides, elbows straight. Resistance band comes across collarbones, hands toward floor. Hold momentarily. Slowly return to starting position. Repeat _10__ times. Band color _yellow____   Sword   On back, knees bent, feet flat, left hand on left hip, right hand above left. Pull right arm DIAGONALLY (hip to shoulder) across chest. Bring right arm along head toward floor. Thumb down when by hip but rotates upwards when by head. Hold momentarily. Slowly return to starting position. Repeat _10__ times. Do with left arm. Band color _yellow_____   Shoulder Rotation: Double Arm   On back, knees bent, feet flat, elbows tucked at sides, bent 90, hands palms up. Pull hands apart and down toward floor, keeping elbows near sides. Hold momentarily. Slowly return to starting position. Repeat _10__ times. Band color __yellow____   Really think about squeezing shoulder blades together through all of these exercises and do not let the shoulder come forward.

## 2020-04-10 NOTE — Therapy (Signed)
[place], [place], [address] Phone: [phone]   Fax:  [phone]  Physical Therapy Treatment  Patient Details  Name: Jade Mooney MRN: [id number] Date of Birth: Mar 05, 1946 Referring Provider (PT): Causey   Encounter Date: 04/10/2020   PT End of Session - 04/10/20 1316    Visit Number 3    Number of Visits 9    Date for PT Re-Evaluation 05/01/20    PT Start Time 1004    PT Stop Time 1059    PT Time Calculation (min) 55 min    Activity Tolerance Patient tolerated treatment well    Behavior During Therapy [facility] for tasks assessed/performed           Past Medical History:  Diagnosis Date  . Anxiety   . Cancer [facility])    Breast 1997 right tx with mastectomy and chemo, metastatic now  . GERD (gastroesophageal reflux disease)   . Hypercholesterolemia   . Hypertension   . Hypothyroidism   . Osteoarthritis    oa  . Personal history of chemotherapy   . Personal history of radiation therapy   . Secondary malignant neoplasm of bone and bone marrow ([place])   . TIA (transient ischemic attack) last 11-08-15   x 2 total    Past Surgical History:  Procedure Laterality Date  . CATARACT EXTRACTION, BILATERAL    . MASTECTOMY MODIFIED RADICAL Right 1997  . porta cath insertion  1997   later removed  . radiation tx  finished 02-25-16   x 20 tx  . TONSILLECTOMY    . TOTAL HIP ARTHROPLASTY Left 03/12/2016   Procedure: LEFT TOTAL HIP ARTHROPLASTY ANTERIOR APPROACH;  Surgeon: Gaynelle Arabian, MD;  Location: [facility];  Service: Orthopedics;  Laterality: Left;  . TOTAL KNEE ARTHROPLASTY Left   . TUBAL LIGATION      There were no vitals filed for this visit.   Subjective Assessment - 04/10/20 1004    Subjective I was sore after last time on Friday and Saturday. I was sore if I coughed. I think when the soreness wore off it felt a little better. I did not have the pains across my ribs like I was having.     Pertinent History s/p Right mastectomy 1997 for an estrogen receptor and HER-2 positive breast cancer, treated with adjuvant chemotherapy according to CALGB 9640 (taxotere x 4 cycles), high-dose chemotherapy (cyclophosphamide, carboplatin, BCNU) followed by stem cell rescue at [facility] 12/21/17, biopsy-proven metastatic disease November 2007, estrogen receptor and HER-2 positive, tamoxifen completed 2002, status post right iliac crest biopsy 10/10/2013 for invasive ductal carcinoma, grade 2, estrogen and progesterone receptor positive, zoledronate started 10/12/2013,  repeated every 12 weeks, biopsy of a scalp lesion 10/17/2013 showed metastatic breast cancer, HER-2/neu negative, biopsy of a right anterior chest wall nodule August 2017 shows adenocarcinoma, anastrozole started 10/28/2013, changed to letrozole 06/27/2016, new skin lesion removed from right anterior chest wall August 2017, status post radiation to the anterior lower right chest wall completed 02/25/2017 (20 sessions) PET scan 02/03/2017 shows her bone metastases, and in addition a 0.5 cm right middle lobe nodule which may be new, MRI of the cervical spine shows collapse of the left lateral mass of C3.  There is no soft tissue mass or focal neural impingement. palbociclib reduced to 75 mg as of September 2018, palbociclib held during cervical spine irradiation finished in mid February, 201l, letrozole discontinued February 2019 secondary to likely progression, neoplasia associated pain, painful blastic lesion  left femoral intertrochanteric region: Status post radiation completed November 2015, pain left knee, status post TKR remotely, left total hip replacement 02/2016, palliative radiation to the cervical spine completed 05/08/2017 in [place], PET scan 01/03/2020 finds no evidence of disease activity    Patient Stated Goals to get rid of the pain under the breast    Currently in Pain? No/denies    Pain Score 0-No pain                              [facility] Adult PT Treatment/Exercise - 04/10/20 0001      Shoulder Exercises: Supine   Horizontal ABduction AROM;Both;10 reps   v/c to engage scapular muscles   External Rotation AROM;Both;10 reps   v/c to engage rhomboids   Flexion AROM   narrow and wide grip, v/c to keep shoulder from protracting   Diagonals AROM;Both;10 reps   v/c to keep scapular muscles engaged     Manual Therapy   Soft tissue mobilization in supine using cocoa butter to R serratus anterior and inferior anterior chest where pt had previous radiation in numerous areas of tightness and discomfort                       PT Long Term Goals - 04/03/20 1203      PT LONG TERM GOAL #1   Title Pt will report 75% improvement in pain in inferior right chest to allow improved comfort.    Time 4    Period Weeks    Status New    Target Date 05/01/20      PT LONG TERM GOAL #2   Title Pt will demonstrate 150 degrees of right shoulder abduction to allow her to reach out to the sides without limitation by tight pec muscle.    Baseline 138    Time 4    Period Weeks    Status New    Target Date 05/01/20      PT LONG TERM GOAL #3   Title Pt will be independent in a home exercise program for continued strengthening and stretching.    Time 4    Period Weeks    Status New    Target Date 05/01/20      PT LONG TERM GOAL #4   Title Pt will demonstrate improved right scapular positioning for optimal R shoulder function.    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 Pt reports she has been having less spasms since last session. Began instructing pt in supine scapular series to help her muscle  memory to keep scapular muscles engaged throughout R UE ROM. Did not use any resistance due to bony mets. Pt felt the AROM was challenging since she was focusing on keeping scapular muscles especially rhomboids engaged to keep R shoulder retracted and depressed throughout.    PT Frequency 2x / week    PT Duration 4 weeks    PT Treatment/Interventions ADLs/Self Care Home Management;Therapeutic exercise;Therapeutic activities;Patient/family education;Manual techniques;Scar mobilization;Passive range of motion;Taping    PT Next  Visit Plan continue gentle strengthening of R scap - no resistance due to bone mets, STM to inferior R chest and R rhomboids, stretching R pec    PT Home Exercise Plan scapular squeezes, supine scap series with no resisitance    Consulted and Agree with Plan of Care Patient           Patient will benefit from skilled therapeutic intervention in order to improve the following deficits and impairments:  Pain,Postural dysfunction,Increased fascial restricitons,Decreased strength,Decreased range of motion,Decreased scar mobility  Visit Diagnosis: Stiffness of right shoulder, not elsewhere classified  Muscle weakness (generalized)  Disorder of the skin and subcutaneous tissue related to radiation, unspecified     Problem List Patient Active Problem List   Diagnosis Date Noted  . Aortic atherosclerosis ([place]) 08/24/2017  . Malignant neoplasm of overlapping sites of right breast in female, estrogen receptor positive ([place]) 04/21/2016  . Goals of care, counseling/discussion 04/21/2016  . OA (osteoarthritis) of hip 03/12/2016  . Pain from bone metastases ([place]) 10/02/2015  . Metastatic breast cancer ([place]) 07/12/2015  . Breast cancer metastasized to bone ([place]) 10/12/2013  . TIA (transient ischemic attack)     Allyson Sabal [place] 04/10/2020, 1:30 PM  [place], [place], [address] Phone:  [phone]   Fax:  [phone]  Name: Jade Mooney MRN: [id number] Date of Birth: 04-04-1945  Manus Gunning, PT 04/10/20 1:30 PM

## 2020-04-12 ENCOUNTER — Other Ambulatory Visit: Payer: Self-pay

## 2020-04-12 ENCOUNTER — Encounter: Payer: Self-pay | Admitting: Physical Therapy

## 2020-04-12 ENCOUNTER — Ambulatory Visit: Payer: Medicare Other | Admitting: Physical Therapy

## 2020-04-12 DIAGNOSIS — L599 Disorder of the skin and subcutaneous tissue related to radiation, unspecified: Secondary | ICD-10-CM | POA: Diagnosis not present

## 2020-04-12 DIAGNOSIS — M6281 Muscle weakness (generalized): Secondary | ICD-10-CM

## 2020-04-12 DIAGNOSIS — R293 Abnormal posture: Secondary | ICD-10-CM | POA: Diagnosis not present

## 2020-04-12 DIAGNOSIS — M25611 Stiffness of right shoulder, not elsewhere classified: Secondary | ICD-10-CM | POA: Diagnosis not present

## 2020-04-12 DIAGNOSIS — M25511 Pain in right shoulder: Secondary | ICD-10-CM

## 2020-04-12 NOTE — Therapy (Signed)
[place], [place], [address] Phone: [phone]   Fax:  [phone]  Physical Therapy Treatment  Patient Details  Name: Jade Mooney MRN: [id number] Date of Birth: 1945-05-10 Referring Provider (PT): Causey   Encounter Date: 04/12/2020   PT End of Session - 04/12/20 1609    Visit Number 4    Number of Visits 9    Date for PT Re-Evaluation 05/01/20    PT Start Time 7408    PT Stop Time 1600    PT Time Calculation (min) 54 min    Activity Tolerance Patient tolerated treatment well    Behavior During Therapy [facility] for tasks assessed/performed           Past Medical History:  Diagnosis Date  . Anxiety   . Cancer [facility])    Breast 1997 right tx with mastectomy and chemo, metastatic now  . GERD (gastroesophageal reflux disease)   . Hypercholesterolemia   . Hypertension   . Hypothyroidism   . Osteoarthritis    oa  . Personal history of chemotherapy   . Personal history of radiation therapy   . Secondary malignant neoplasm of bone and bone marrow ([place])   . TIA (transient ischemic attack) last 11-08-15   x 2 total    Past Surgical History:  Procedure Laterality Date  . CATARACT EXTRACTION, BILATERAL    . MASTECTOMY MODIFIED RADICAL Right 1997  . porta cath insertion  1997   later removed  . radiation tx  finished 02-25-16   x 20 tx  . TONSILLECTOMY    . TOTAL HIP ARTHROPLASTY Left 03/12/2016   Procedure: LEFT TOTAL HIP ARTHROPLASTY ANTERIOR APPROACH;  Surgeon: Gaynelle Arabian, MD;  [facility];  Service: Orthopedics;  Laterality: Left;  . TOTAL KNEE ARTHROPLASTY Left   . TUBAL LIGATION      There were no vitals filed for this visit.   Subjective Assessment - 04/12/20 1510    Subjective Yesterday was a good day. I have had a decrease in the number of spasms I was having. I did the exercises.    Pertinent History s/p Right mastectomy 1997 for an estrogen receptor and HER-2  positive breast cancer, treated with adjuvant chemotherapy according to CALGB 9640 (taxotere x 4 cycles), high-dose chemotherapy (cyclophosphamide, carboplatin, BCNU) followed by stem cell rescue at [facility] 12/21/17, biopsy-proven metastatic disease November 2007, estrogen receptor and HER-2 positive, tamoxifen completed 2002, status post right iliac crest biopsy 10/10/2013 for invasive ductal carcinoma, grade 2, estrogen and progesterone receptor positive, zoledronate started 10/12/2013,  repeated every 12 weeks, biopsy of a scalp lesion 10/17/2013 showed metastatic breast cancer, HER-2/neu negative, biopsy of a right anterior chest wall nodule August 2017 shows adenocarcinoma, anastrozole started 10/28/2013, changed to letrozole 06/27/2016, new skin lesion removed from right anterior chest wall August 2017, status post radiation to the anterior lower right chest wall completed 02/25/2017 (20 sessions) PET scan 02/03/2017 shows her bone metastases, and in addition a 0.5 cm right middle lobe nodule which may be new, MRI of the cervical spine shows collapse of the left lateral mass of C3.  There is no soft tissue mass or focal neural impingement. palbociclib reduced to 75 mg as of September 2018, palbociclib held during cervical spine irradiation finished in mid February, 201l, letrozole discontinued February 2019 secondary to likely progression, neoplasia associated pain, painful blastic lesion left femoral intertrochanteric region: Status post radiation completed November 2015, pain left knee, status post TKR remotely, left total  hip replacement 02/2016, palliative radiation to the cervical spine completed 05/08/2017 in [place], PET scan 01/03/2020 finds no evidence of disease activity    Patient Stated Goals to get rid of the pain under the breast    Currently in Pain? No/denies    Pain Score 0-No pain                             [facility] Adult PT Treatment/Exercise - 04/12/20 0001       Shoulder Exercises: Supine   Horizontal ABduction AROM;Both;10 reps   v/c to engage scapular muscles   External Rotation AROM;Both;10 reps   v/c to engage rhomboids   Flexion AROM   narrow and wide grip, v/c to keep shoulder from protracting   Diagonals AROM;Both;10 reps   v/c to keep scapular muscles engaged     Manual Therapy   Soft tissue mobilization in left sidelying using cocoa butter to R serratus anterior and inferior anterior chest where pt had previous radiation in numerous areas of tightness and discomfort    Passive ROM to R shoulder in to flexion and abduction and to R scapula in to protraction and retraction as well as upward rotation to improve scapular mobility                       PT Long Term Goals - 04/03/20 1203      PT LONG TERM GOAL #1   Title Pt will report 75% improvement in pain in inferior right chest to allow improved comfort.    Time 4    Period Weeks    Status New    Target Date 05/01/20      PT LONG TERM GOAL #2   Title Pt will demonstrate 150 degrees of right shoulder abduction to allow her to reach out to the sides without limitation by tight pec muscle.    Baseline 138    Time 4    Period Weeks    Status New    Target Date 05/01/20      PT LONG TERM GOAL #3   Title Pt will be independent in a home exercise program for continued strengthening and stretching.    Time 4    Period Weeks    Status New    Target Date 05/01/20      PT LONG TERM GOAL #4   Title Pt will demonstrate improved right scapular positioning for optimal R shoulder function.    Baseline scapula is abducted and depressed    Time 4    Period Weeks    Status New    Target Date 05/01/20                 Plan - 04/12/20 1610    Clinical Impression Statement Had pt return demonstrate supine scapular series with no resistance but with focus on correct movement pattern and keeping scapular muscles engaged. Pt reports she continues to have fewer spasms and  overall her pain is improving. She still requires cueing for correct position of her shoulder. Continued with manual therapy to anterior and lateral chest in area of spasms and also added gentle ROM to scapula to decrease tightness.    PT Frequency 2x / week    PT Duration 4 weeks    PT Treatment/Interventions ADLs/Self Care Home Management;Therapeutic exercise;Therapeutic activities;Patient/family education;Manual techniques;Scar mobilization;Passive range of motion;Taping    PT Next Visit Plan continue gentle strengthening of  R scap - no resistance due to bone mets, STM to inferior R chest and R rhomboids, stretching R pec    PT Home Exercise Plan scapular squeezes, supine scap series with no resisitance    Consulted and Agree with Plan of Care Patient           Patient will benefit from skilled therapeutic intervention in order to improve the following deficits and impairments:  Pain,Postural dysfunction,Increased fascial restricitons,Decreased strength,Decreased range of motion,Decreased scar mobility  Visit Diagnosis: Stiffness of right shoulder, not elsewhere classified  Muscle weakness (generalized)  Acute pain of right shoulder     Problem List Patient Active Problem List   Diagnosis Date Noted  . Aortic atherosclerosis ([place]) 08/24/2017  . Malignant neoplasm of overlapping sites of right breast in female, estrogen receptor positive ([place]) 04/21/2016  . Goals of care, counseling/discussion 04/21/2016  . OA (osteoarthritis) of hip 03/12/2016  . Pain from bone metastases ([place]) 10/02/2015  . Metastatic breast cancer ([place]) 07/12/2015  . Breast cancer metastasized to bone ([place]) 10/12/2013  . TIA (transient ischemic attack)     Manus Gunning 04/12/2020, 4:12 PM  [place], [place], [address] Phone: [phone]   Fax:  [phone]  Name: Jade Mooney MRN: [id number] Date of  Birth: 26-Feb-1946  Allyson Sabal [place], PT 04/12/20 4:13 PM

## 2020-04-17 ENCOUNTER — Encounter: Payer: Self-pay | Admitting: Physical Therapy

## 2020-04-17 ENCOUNTER — Ambulatory Visit: Payer: Medicare Other | Admitting: Physical Therapy

## 2020-04-17 ENCOUNTER — Other Ambulatory Visit: Payer: Self-pay

## 2020-04-17 DIAGNOSIS — M25611 Stiffness of right shoulder, not elsewhere classified: Secondary | ICD-10-CM

## 2020-04-17 DIAGNOSIS — L599 Disorder of the skin and subcutaneous tissue related to radiation, unspecified: Secondary | ICD-10-CM | POA: Diagnosis not present

## 2020-04-17 DIAGNOSIS — M25511 Pain in right shoulder: Secondary | ICD-10-CM

## 2020-04-17 DIAGNOSIS — R293 Abnormal posture: Secondary | ICD-10-CM

## 2020-04-17 DIAGNOSIS — M6281 Muscle weakness (generalized): Secondary | ICD-10-CM | POA: Diagnosis not present

## 2020-04-17 NOTE — Therapy (Signed)
[place], [place], [address] Phone: [phone]   Fax:  [phone]  Physical Therapy Treatment  Patient Details  Name: Jade Mooney MRN: [id number] Date of Birth: January 07, 1946 Referring Provider (PT): Causey   Encounter Date: 04/17/2020   PT End of Session - 04/17/20 1056    Visit Number 5    Number of Visits 9    Date for PT Re-Evaluation 05/01/20    PT Start Time 1008    PT Stop Time 1057    PT Time Calculation (min) 49 min    Activity Tolerance Patient tolerated treatment well    Behavior During Therapy [facility] for tasks assessed/performed           Past Medical History:  Diagnosis Date  . Anxiety   . Cancer [facility])    Breast 1997 right tx with mastectomy and chemo, metastatic now  . GERD (gastroesophageal reflux disease)   . Hypercholesterolemia   . Hypertension   . Hypothyroidism   . Osteoarthritis    oa  . Personal history of chemotherapy   . Personal history of radiation therapy   . Secondary malignant neoplasm of bone and bone marrow ([place])   . TIA (transient ischemic attack) last 11-08-15   x 2 total    Past Surgical History:  Procedure Laterality Date  . CATARACT EXTRACTION, BILATERAL    . MASTECTOMY MODIFIED RADICAL Right 1997  . porta cath insertion  1997   later removed  . radiation tx  finished 02-25-16   x 20 tx  . TONSILLECTOMY    . TOTAL HIP ARTHROPLASTY Left 03/12/2016   Procedure: LEFT TOTAL HIP ARTHROPLASTY ANTERIOR APPROACH;  Surgeon: Gaynelle Arabian, MD;  Location: [facility];  Service: Orthopedics;  Laterality: Left;  . TOTAL KNEE ARTHROPLASTY Left   . TUBAL LIGATION      There were no vitals filed for this visit.   Subjective Assessment - 04/17/20 1009    Subjective Things went really well after last time. I think we are on the right track.    Pertinent History s/p Right mastectomy 1997 for an estrogen receptor and HER-2 positive breast cancer, treated  with adjuvant chemotherapy according to CALGB 9640 (taxotere x 4 cycles), high-dose chemotherapy (cyclophosphamide, carboplatin, BCNU) followed by [facility] cell rescue at [facility] 12/21/17, biopsy-proven metastatic disease November 2007, estrogen receptor and HER-2 positive, tamoxifen completed 2002, status post right iliac crest biopsy 10/10/2013 for invasive ductal carcinoma, grade 2, estrogen and progesterone receptor positive, zoledronate started 10/12/2013,  repeated every 12 weeks, biopsy of a scalp lesion 10/17/2013 showed metastatic breast cancer, HER-2/neu negative, biopsy of a right anterior chest wall nodule August 2017 shows adenocarcinoma, anastrozole started 10/28/2013, changed to letrozole 06/27/2016, new skin lesion removed from right anterior chest wall August 2017, status post radiation to the anterior lower right chest wall completed 02/25/2017 (20 sessions) PET scan 02/03/2017 shows her bone metastases, and in addition a 0.5 cm right middle lobe nodule which may be new, MRI of the cervical spine shows collapse of the left lateral mass of C3.  There is no soft tissue mass or focal neural impingement. palbociclib reduced to 75 mg as of September 2018, palbociclib held during cervical spine irradiation finished in mid February, 201l, letrozole discontinued February 2019 secondary to likely progression, neoplasia associated pain, painful blastic lesion left femoral intertrochanteric region: Status post radiation completed November 2015, pain left knee, status post TKR remotely, left total hip replacement 02/2016, palliative radiation to the  cervical spine completed 05/08/2017 in [place], PET scan 01/03/2020 finds no evidence of disease activity    Patient Stated Goals to get rid of the pain under the breast    Currently in Pain? No/denies    Pain Score 0-No pain                             [facility] Adult PT Treatment/Exercise - 04/17/20 0001      Shoulder Exercises: Standing    Horizontal ABduction AROM;Both;10 reps    External Rotation AROM;10 reps;Both    Flexion AROM;Both;10 reps   narrow and wide grip   Diagonals AROM;10 reps;Both      Manual Therapy   Soft tissue mobilization in supine using cocoa butter to R serratus anterior and inferior anterior chest where pt had previous radiation in numerous areas of tightness and discomfort    Passive ROM to R shoulder in to flexion and abduction and ER                       PT Long Term Goals - 04/03/20 1203      PT LONG TERM GOAL #1   Title Pt will report 75% improvement in pain in inferior right chest to allow improved comfort.    Time 4    Period Weeks    Status New    Target Date 05/01/20      PT LONG TERM GOAL #2   Title Pt will demonstrate 150 degrees of right shoulder abduction to allow her to reach out to the sides without limitation by tight pec muscle.    Baseline 138    Time 4    Period Weeks    Status New    Target Date 05/01/20      PT LONG TERM GOAL #3   Title Pt will be independent in a home exercise program for continued strengthening and stretching.    Time 4    Period Weeks    Status New    Target Date 05/01/20      PT LONG TERM GOAL #4   Title Pt will demonstrate improved right scapular positioning for optimal R shoulder function.    Baseline scapula is abducted and depressed    Time 4    Period Weeks    Status New    Target Date 05/01/20                 Plan - 04/17/20 1058    Clinical Impression Statement Pt is progressing towards her goals. She is having less pain and no longer having muscle spasms. Her standing posture is improving. Progressed pt to standing scapular strengthening exercises today with no resistance. Pt is requiring less cueing for position of her shoulder/scapula. She is having trouble with her IT band and if this persists will add this to her plan of care. Continued with manual therapy today with focus on stretching and soft tissue   mobilization to R pec.    PT Frequency 2x / week    PT Duration 4 weeks    PT Treatment/Interventions ADLs/Self Care Home Management;Therapeutic exercise;Therapeutic activities;Patient/family education;Manual techniques;Scar mobilization;Passive range of motion;Taping    PT Next Visit Plan continue gentle strengthening of R scap - no resistance due to bone mets, STM to inferior R chest and R rhomboids, stretching R pec    PT Home Exercise Plan scapular squeezes, supine scap series with no resisitance  Consulted and Agree with Plan of Care Patient           Patient will benefit from skilled therapeutic intervention in order to improve the following deficits and impairments:  Pain,Postural dysfunction,Increased fascial restricitons,Decreased strength,Decreased range of motion,Decreased scar mobility  Visit Diagnosis: Stiffness of right shoulder, not elsewhere classified  Acute pain of right shoulder  Disorder of the skin and subcutaneous tissue related to radiation, unspecified  Abnormal posture  Muscle weakness (generalized)     Problem List Patient Active Problem List   Diagnosis Date Noted  . Aortic atherosclerosis ([place]) 08/24/2017  . 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 Goals of care, counseling/discussion 04/21/2016  . OA (osteoarthritis) of hip 03/12/2016  . Pain from bone metastases ([place]) 10/02/2015  . Metastatic breast cancer ([place]) 07/12/2015  . Breast cancer metastasized to bone ([place]) 10/12/2013  . TIA (transient ischemic attack)     Allyson Sabal [facility] 04/17/2020, 11:01 AM  [place], [place], [address] Phone: [phone]   Fax:  [phone]  Name: Jeylin [place] MRN: [id number] Date of Birth: 1946/01/14  Manus Gunning, PT 04/17/20 11:02 AM

## 2020-04-18 DIAGNOSIS — M1711 Unilateral primary osteoarthritis, right knee: Secondary | ICD-10-CM | POA: Diagnosis not present

## 2020-04-19 ENCOUNTER — Encounter: Payer: Self-pay | Admitting: Physical Therapy

## 2020-04-19 ENCOUNTER — Ambulatory Visit: Payer: Medicare Other | Admitting: Physical Therapy

## 2020-04-19 ENCOUNTER — Other Ambulatory Visit: Payer: Self-pay

## 2020-04-19 DIAGNOSIS — R293 Abnormal posture: Secondary | ICD-10-CM | POA: Diagnosis not present

## 2020-04-19 DIAGNOSIS — M25611 Stiffness of right shoulder, not elsewhere classified: Secondary | ICD-10-CM

## 2020-04-19 DIAGNOSIS — M25511 Pain in right shoulder: Secondary | ICD-10-CM

## 2020-04-19 DIAGNOSIS — L599 Disorder of the skin and subcutaneous tissue related to radiation, unspecified: Secondary | ICD-10-CM | POA: Diagnosis not present

## 2020-04-19 DIAGNOSIS — M6281 Muscle weakness (generalized): Secondary | ICD-10-CM | POA: Diagnosis not present

## 2020-04-19 NOTE — Therapy (Signed)
[place], [place], [address] Phone: [phone]   Fax:  [phone]  Physical Therapy Treatment  Patient Details  Name: Jade Mooney MRN: [id number] Date of Birth: 1946-02-07 Referring Provider (PT): Causey   Encounter Date: 04/19/2020   PT End of Session - 04/19/20 0905    Visit Number 6    Number of Visits 9    Date for PT Re-Evaluation 05/01/20    PT Start Time 0903    PT Stop Time 0959    PT Time Calculation (min) 56 min    Activity Tolerance Patient tolerated treatment well    Behavior During Therapy [facility] for tasks assessed/performed           Past Medical History:  Diagnosis Date  . Anxiety   . Cancer [facility])    Breast 1997 right tx with mastectomy and chemo, metastatic now  . GERD (gastroesophageal reflux disease)   . Hypercholesterolemia   . Hypertension   . Hypothyroidism   . Osteoarthritis    oa  . Personal history of chemotherapy   . Personal history of radiation therapy   . Secondary malignant neoplasm of bone and bone marrow ([place])   . TIA (transient ischemic attack) last 11-08-15   x 2 total    Past Surgical History:  Procedure Laterality Date  . CATARACT EXTRACTION, BILATERAL    . MASTECTOMY MODIFIED RADICAL Right 1997  . porta cath insertion  1997   later removed  . radiation tx  finished 02-25-16   x 20 tx  . TONSILLECTOMY    . TOTAL HIP ARTHROPLASTY Left 03/12/2016   Procedure: LEFT TOTAL HIP ARTHROPLASTY ANTERIOR APPROACH;  Surgeon: Gaynelle Arabian, MD;  Location: [facility];  Service: Orthopedics;  Laterality: Left;  . TOTAL KNEE ARTHROPLASTY Left   . TUBAL LIGATION      There were no vitals filed for this visit.   Subjective Assessment - 04/19/20 0904    Subjective I did not have spasm but I had some soreness in my back but we did go out an walk and sometimes my back is sore after that.    Pertinent History s/p Right mastectomy 1997 for an estrogen  receptor and HER-2 positive breast cancer, treated with adjuvant chemotherapy according to CALGB 9640 (taxotere x 4 cycles), high-dose chemotherapy (cyclophosphamide, carboplatin, BCNU) followed by stem cell rescue at [facility] 12/21/17, biopsy-proven metastatic disease November 2007, estrogen receptor and HER-2 positive, tamoxifen completed 2002, status post right iliac crest biopsy 10/10/2013 for invasive ductal carcinoma, grade 2, estrogen and progesterone receptor positive, zoledronate started 10/12/2013,  repeated every 12 weeks, biopsy of a scalp lesion 10/17/2013 showed metastatic breast cancer, HER-2/neu negative, biopsy of a right anterior chest wall nodule August 2017 shows adenocarcinoma, anastrozole started 10/28/2013, changed to letrozole 06/27/2016, new skin lesion removed from right anterior chest wall August 2017, status post radiation to the anterior lower right chest wall completed 02/25/2017 (20 sessions) PET scan 02/03/2017 shows her bone metastases, and in addition a 0.5 cm right middle lobe nodule which may be new, MRI of the cervical spine shows collapse of the left lateral mass of C3.  There is no soft tissue mass or focal neural impingement. palbociclib reduced to 75 mg as of September 2018, palbociclib held during cervical spine irradiation finished in mid February, 201l, letrozole discontinued February 2019 secondary to likely progression, neoplasia associated pain, painful blastic lesion left femoral intertrochanteric region: Status post radiation completed November 2015, pain left knee,  status post TKR remotely, left total hip replacement 02/2016, palliative radiation to the cervical spine completed 05/08/2017 in [place], PET scan 01/03/2020 finds no evidence of disease activity    Patient Stated Goals to get rid of the pain under the breast    Currently in Pain? No/denies    Pain Score 0-No pain                             [facility] Adult PT Treatment/Exercise -  04/19/20 0001      Manual Therapy   Soft tissue mobilization in supine using cocoa butter to R serratus anterior and inferior anterior chest where pt had previous radiation in numerous areas of tightness and discomfort, then to R sidelying to R lats, serratus, and rhomboids where pt had increased tightness and muscle tension that improved with STM    Passive ROM to R shoulder in to flexion and abduction and ER                       PT Long Term Goals - 04/03/20 1203      PT LONG TERM GOAL #1   Title Pt will report 75% improvement in pain in inferior right chest to allow improved comfort.    Time 4    Period Weeks    Status New    Target Date 05/01/20      PT LONG TERM GOAL #2   Title Pt will demonstrate 150 degrees of right shoulder abduction to allow her to reach out to the sides without limitation by tight pec muscle.    Baseline 138    Time 4    Period Weeks    Status New    Target Date 05/01/20      PT LONG TERM GOAL #3   Title Pt will be independent in a home exercise program for continued strengthening and stretching.    Time 4    Period Weeks    Status New    Target Date 05/01/20      PT LONG TERM GOAL #4   Title Pt will demonstrate improved right scapular positioning for optimal R shoulder function.    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 She felt it may have been due to taking a walk. There was palpable muscle tightness in this area that was released with soft tissue moblization. Pt felt much better by end of session. Continued with PROM to R shoulder and soft tissue mobilization to R pec with focus around area that was radiation.    PT Frequency 2x / week    PT Duration 4 weeks    PT Treatment/Interventions ADLs/Self Care Home  Management;Therapeutic exercise;Therapeutic activities;Patient/family education;Manual techniques;Scar mobilization;Passive range of motion;Taping    PT Next Visit Plan continue gentle strengthening of R scap - no resistance due to bone mets, STM to inferior R chest and R rhomboids, stretching R pec    PT Home Exercise Plan scapular squeezes, supine scap series with no resisitance    Consulted and Agree with Plan of Care Patient  Patient will benefit from skilled therapeutic intervention in order to improve the following deficits and impairments:  Pain,Postural dysfunction,Increased fascial restricitons,Decreased strength,Decreased range of motion,Decreased scar mobility  Visit Diagnosis: Stiffness of right shoulder, not elsewhere classified  Acute pain of right shoulder  Disorder of the skin and subcutaneous tissue related to radiation, unspecified     Problem List Patient Active Problem List   Diagnosis Date Noted  . Aortic atherosclerosis ([place]) 08/24/2017  . Malignant neoplasm of overlapping sites of right breast in female, estrogen receptor positive ([place]) 04/21/2016  . Goals of care, counseling/discussion 04/21/2016  . OA (osteoarthritis) of hip 03/12/2016  . Pain from bone metastases ([place]) 10/02/2015  . Metastatic breast cancer ([place]) 07/12/2015  . Breast cancer metastasized to bone ([place]) 10/12/2013  . TIA (transient ischemic attack)     Allyson Sabal [facility] 04/19/2020, 10:03 AM  [place], [place], [address] Phone: [phone]   Fax:  [phone]  Name: Jade Mooney MRN: [id number] Date of Birth: February 06, 1946  Manus Gunning, PT 04/19/20 10:03 AM

## 2020-04-22 ENCOUNTER — Other Ambulatory Visit: Payer: Self-pay | Admitting: Oncology

## 2020-04-22 DIAGNOSIS — C50919 Malignant neoplasm of unspecified site of unspecified female breast: Secondary | ICD-10-CM

## 2020-04-22 DIAGNOSIS — E785 Hyperlipidemia, unspecified: Secondary | ICD-10-CM | POA: Diagnosis not present

## 2020-04-22 DIAGNOSIS — K219 Gastro-esophageal reflux disease without esophagitis: Secondary | ICD-10-CM | POA: Diagnosis not present

## 2020-04-22 DIAGNOSIS — E039 Hypothyroidism, unspecified: Secondary | ICD-10-CM | POA: Diagnosis not present

## 2020-04-22 DIAGNOSIS — I10 Essential (primary) hypertension: Secondary | ICD-10-CM | POA: Diagnosis not present

## 2020-04-24 ENCOUNTER — Encounter: Payer: Self-pay | Admitting: Physical Therapy

## 2020-04-24 ENCOUNTER — Other Ambulatory Visit: Payer: Self-pay | Admitting: Hematology and Oncology

## 2020-04-24 ENCOUNTER — Telehealth: Payer: Self-pay | Admitting: Oncology

## 2020-04-24 ENCOUNTER — Inpatient Hospital Stay: Payer: Medicare Other | Attending: Oncology

## 2020-04-24 ENCOUNTER — Ambulatory Visit: Payer: Medicare Other | Attending: Adult Health | Admitting: Physical Therapy

## 2020-04-24 ENCOUNTER — Other Ambulatory Visit: Payer: Self-pay

## 2020-04-24 DIAGNOSIS — M25611 Stiffness of right shoulder, not elsewhere classified: Secondary | ICD-10-CM | POA: Diagnosis not present

## 2020-04-24 DIAGNOSIS — C44501 Unspecified malignant neoplasm of skin of breast: Secondary | ICD-10-CM | POA: Insufficient documentation

## 2020-04-24 DIAGNOSIS — L599 Disorder of the skin and subcutaneous tissue related to radiation, unspecified: Secondary | ICD-10-CM | POA: Insufficient documentation

## 2020-04-24 DIAGNOSIS — Z5111 Encounter for antineoplastic chemotherapy: Secondary | ICD-10-CM | POA: Insufficient documentation

## 2020-04-24 DIAGNOSIS — M79605 Pain in left leg: Secondary | ICD-10-CM | POA: Insufficient documentation

## 2020-04-24 DIAGNOSIS — R293 Abnormal posture: Secondary | ICD-10-CM | POA: Insufficient documentation

## 2020-04-24 DIAGNOSIS — C7951 Secondary malignant neoplasm of bone: Secondary | ICD-10-CM | POA: Diagnosis not present

## 2020-04-24 DIAGNOSIS — M6281 Muscle weakness (generalized): Secondary | ICD-10-CM | POA: Insufficient documentation

## 2020-04-24 DIAGNOSIS — M25511 Pain in right shoulder: Secondary | ICD-10-CM | POA: Insufficient documentation

## 2020-04-24 DIAGNOSIS — D649 Anemia, unspecified: Secondary | ICD-10-CM | POA: Diagnosis not present

## 2020-04-24 DIAGNOSIS — C7989 Secondary malignant neoplasm of other specified sites: Secondary | ICD-10-CM | POA: Insufficient documentation

## 2020-04-24 LAB — CBC AND DIFFERENTIAL
HCT: 37 (ref 36–46)
Hemoglobin: 12.7 (ref 12.0–16.0)
Neutrophils Absolute: 1.23
Platelets: 204 (ref 150–399)
WBC: 2.2

## 2020-04-24 LAB — HEPATIC FUNCTION PANEL
ALT: 18 (ref 7–35)
AST: 30 (ref 13–35)
Alkaline Phosphatase: 57 (ref 25–125)
Bilirubin, Total: 0.5

## 2020-04-24 LAB — CBC: RBC: 4.09 (ref 3.87–5.11)

## 2020-04-24 LAB — BASIC METABOLIC PANEL
BUN: 15 (ref 4–21)
CO2: 26 — AB (ref 13–22)
Chloride: 103 (ref 99–108)
Creatinine: 0.9 (ref 0.5–1.1)
Glucose: 109
Potassium: 3.6 (ref 3.4–5.3)
Sodium: 136 — AB (ref 137–147)

## 2020-04-24 LAB — COMPREHENSIVE METABOLIC PANEL
Albumin: 4.4 (ref 3.5–5.0)
Calcium: 8.9 (ref 8.7–10.7)

## 2020-04-24 NOTE — Telephone Encounter (Signed)
Patient scheduled for March/April Appt's.  Gave patient Appt Summary

## 2020-04-24 NOTE — Therapy (Signed)
[place], [place], [address] Phone: [phone]   Fax:  [phone]  Physical Therapy Treatment  Patient Details  Name: Jade Mooney MRN: [id number] Date of Birth: December 13, 1945 Referring Provider (PT): Causey   Encounter Date: 04/24/2020   PT End of Session - 04/24/20 1510    Visit Number 7    Number of Visits 9    Date for PT Re-Evaluation 05/01/20    PT Start Time 1404    PT Stop Time 1459    PT Time Calculation (min) 55 min    Activity Tolerance Patient tolerated treatment well    Behavior During Therapy [facility] for tasks assessed/performed           Past Medical History:  Diagnosis Date  . Anxiety   . Cancer [facility])    Breast 1997 right tx with mastectomy and chemo, metastatic now  . GERD (gastroesophageal reflux disease)   . Hypercholesterolemia   . Hypertension   . Hypothyroidism   . Osteoarthritis    oa  . Personal history of chemotherapy   . Personal history of radiation therapy   . Secondary malignant neoplasm of bone and bone marrow ([place])   . TIA (transient ischemic attack) last 11-08-15   x 2 total    Past Surgical History:  Procedure Laterality Date  . CATARACT EXTRACTION, BILATERAL    . MASTECTOMY MODIFIED RADICAL Right 1997  . porta cath insertion  1997   later removed  . radiation tx  finished 02-25-16   x 20 tx  . TONSILLECTOMY    . TOTAL HIP ARTHROPLASTY Left 03/12/2016   Procedure: LEFT TOTAL HIP ARTHROPLASTY ANTERIOR APPROACH;  Surgeon: Gaynelle Arabian, MD;  Location: [facility];  Service: Orthopedics;  Laterality: Left;  . TOTAL KNEE ARTHROPLASTY Left   . TUBAL LIGATION      There were no vitals filed for this visit.   Subjective Assessment - 04/24/20 1406    Subjective Overall my pain is getting better. When I walk though I notice I have increased back pain.    Pertinent History s/p Right mastectomy 1997 for an estrogen receptor and HER-2 positive breast  cancer, treated with adjuvant chemotherapy according to CALGB 9640 (taxotere x 4 cycles), high-dose chemotherapy (cyclophosphamide, carboplatin, BCNU) followed by stem cell rescue at [facility] 12/21/17, biopsy-proven metastatic disease November 2007, estrogen receptor and HER-2 positive, tamoxifen completed 2002, status post right iliac crest biopsy 10/10/2013 for invasive ductal carcinoma, grade 2, estrogen and progesterone receptor positive, zoledronate started 10/12/2013,  repeated every 12 weeks, biopsy of a scalp lesion 10/17/2013 showed metastatic breast cancer, HER-2/neu negative, biopsy of a right anterior chest wall nodule August 2017 shows adenocarcinoma, anastrozole started 10/28/2013, changed to letrozole 06/27/2016, new skin lesion removed from right anterior chest wall August 2017, status post radiation to the anterior lower right chest wall completed 02/25/2017 (20 sessions) PET scan 02/03/2017 shows her bone metastases, and in addition a 0.5 cm right middle lobe nodule which may be new, MRI of the cervical spine shows collapse of the left lateral mass of C3.  There is no soft tissue mass or focal neural impingement. palbociclib reduced to 75 mg as of September 2018, palbociclib held during cervical spine irradiation finished in mid February, 201l, letrozole discontinued February 2019 secondary to likely progression, neoplasia associated pain, painful blastic lesion left femoral intertrochanteric region: Status post radiation completed November 2015, pain left knee, status post TKR remotely, left total hip replacement 02/2016, palliative radiation  to the cervical spine completed 05/08/2017 in [place], PET scan 01/03/2020 finds no evidence of disease activity    Patient Stated Goals to get rid of the pain under the breast    Currently in Pain? Yes    Pain Score 2     Pain Location Back    Pain Orientation Mid;Left    Pain Descriptors / Indicators Aching    Pain Type Acute pain    Pain Onset In the  past 7 days    Pain Frequency Intermittent    Aggravating Factors  walking    Pain Relieving Factors tylenol    Effect of Pain on Daily Activities none                             [facility] Adult PT Treatment/Exercise - 04/24/20 0001      Exercises   Exercises Other Exercises    Other Exercises  Meeks decompression exercises all with 5 sec holds x 5 times for back pain      Manual Therapy   Soft tissue mobilization in left sidelying to bilateral rhomboids, paraspinals and lats with more time spent on L side where pt has increased pain after walking yesterday                       PT Long Term Goals - 04/03/20 1203      PT LONG TERM GOAL #1   Title Pt will report 75% improvement in pain in inferior right chest to allow improved comfort.    Time 4    Period Weeks    Status New    Target Date 05/01/20      PT LONG TERM GOAL #2   Title Pt will demonstrate 150 degrees of right shoulder abduction to allow her to reach out to the sides without limitation by tight pec muscle.    Baseline 138    Time 4    Period Weeks    Status New    Target Date 05/01/20      PT LONG TERM GOAL #3   Title Pt will be independent in a home exercise program for continued strengthening and stretching.    Time 4    Period Weeks    Status New    Target Date 05/01/20      PT LONG TERM GOAL #4   Title Pt will demonstrate improved right scapular positioning for optimal R shoulder function.    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 She has been having mid back pain more on the left side especially since she has begun walking in the neighborhood. Focused on soft tissue mobilization to musculature in this area today with increased tightness noted on the L side but pt  reported her pain improved by end of session. Also instructed pt in Meeks decompression exercsies to help reduce back pain today. Began instructing pt in core strengthening exercises to also help her back and overall posture.    PT Frequency 2x / week    PT Duration 4 weeks    PT Treatment/Interventions ADLs/Self Care Home Management;Therapeutic  exercise;Therapeutic activities;Patient/family education;Manual techniques;Scar mobilization;Passive range of motion;Taping    PT Next Visit Plan assess indep with new exercises, continue gentle strengthening of R scap - no resistance due to bone mets, STM to inferior R chest and R rhomboids, stretching R pec    PT Home Exercise Plan scapular squeezes, supine scap series with no resisitance, pelvic tilts, meeks decompression    Consulted and Agree with Plan of Care Patient           Patient will benefit from skilled therapeutic intervention in order to improve the following deficits and impairments:  Pain,Postural dysfunction,Increased fascial restricitons,Decreased strength,Decreased range of motion,Decreased scar mobility  Visit Diagnosis: Disorder of the skin and subcutaneous tissue related to radiation, unspecified  Abnormal posture  Stiffness of right shoulder, not elsewhere classified     Problem List Patient Active Problem List   Diagnosis Date Noted  . Aortic atherosclerosis ([place]) 08/24/2017  . Malignant neoplasm of overlapping sites of right breast in female, estrogen receptor positive ([place]) 04/21/2016  . Goals of care, counseling/discussion 04/21/2016  . OA (osteoarthritis) of hip 03/12/2016  . Pain from bone metastases ([place]) 10/02/2015  . Metastatic breast cancer ([place]) 07/12/2015  . Breast cancer metastasized to bone ([place]) 10/12/2013  . TIA (transient ischemic attack)     Allyson Sabal [facility] 04/24/2020, 3:13 PM  [place], [place],  [address] Phone: [phone]   Fax:  [phone]  Name: Jade Mooney MRN: [id number] Date of Birth: [birth year]  Allyson Sabal [place], PT 04/24/20 3:13 PM

## 2020-04-24 NOTE — Patient Instructions (Signed)
1. Decompression Exercise     Cancer Rehab [phone]    Lie on back on firm surface, knees bent, feet flat, arms turned up, out to sides, backs of hands down. Time _5-15__ minutes. Surface: floor   2. Shoulder Press    Start in Decompression Exercise position. Press shoulders downward towards supporting surface. Hold __2-3__ seconds while counting out loud. Repeat _3-5___ times. Do _1-2___ times per day.   3. Head Press    Bring cervical spine (neck) into neutral position (by either tucking the chin towards the chest or tilting the chin upward). Feel weight on back of head. Press head downward into supporting surface.    Hold _2-3__ seconds. Repeat _3-5__ times. Do _1-2__ times per day.   4. Leg Lengthener    Straighten one leg. Pull toes AND forefoot toward knee, extend heel. Lengthen leg by pulling pelvis away from ribs. Hold _2-3__ seconds. Relax. Repeat __4-6__ times. Do other leg.  Surface: floor   5. Leg Press    Straighten one leg down to floor keeping leg aligned with hip. Pull toes AND forefoot toward knee; extend heel.  Press entire leg downward (as if pressing leg into sandy beach). DO NOT BEND KNEE. Hold _2-3__ seconds. Do __4-6__ times. Repeat with other leg.    Pelvic Tilt (Flexion)    With feet flat and knees bent, flatten lower back into bed. Tighten stomach muscles. Hold _5___ seconds. Repeat __10__ times. Do _1-2___ sessions per day.  http://[email or web address].exer.us/230   Copyright  VHI. All rights reserved.

## 2020-04-25 ENCOUNTER — Encounter: Payer: Self-pay | Admitting: Oncology

## 2020-04-26 ENCOUNTER — Ambulatory Visit: Payer: Medicare Other | Admitting: Physical Therapy

## 2020-04-26 ENCOUNTER — Inpatient Hospital Stay: Payer: Medicare Other

## 2020-04-26 ENCOUNTER — Encounter: Payer: Self-pay | Admitting: Physical Therapy

## 2020-04-26 ENCOUNTER — Other Ambulatory Visit: Payer: Self-pay

## 2020-04-26 VITALS — BP 138/73 | HR 68 | Temp 98.4°F | Resp 18 | Ht 64.0 in | Wt 173.8 lb

## 2020-04-26 DIAGNOSIS — C7951 Secondary malignant neoplasm of bone: Secondary | ICD-10-CM

## 2020-04-26 DIAGNOSIS — M6281 Muscle weakness (generalized): Secondary | ICD-10-CM

## 2020-04-26 DIAGNOSIS — L599 Disorder of the skin and subcutaneous tissue related to radiation, unspecified: Secondary | ICD-10-CM | POA: Diagnosis not present

## 2020-04-26 DIAGNOSIS — C50919 Malignant neoplasm of unspecified site of unspecified female breast: Secondary | ICD-10-CM

## 2020-04-26 DIAGNOSIS — M79605 Pain in left leg: Secondary | ICD-10-CM

## 2020-04-26 DIAGNOSIS — C7989 Secondary malignant neoplasm of other specified sites: Secondary | ICD-10-CM | POA: Diagnosis not present

## 2020-04-26 DIAGNOSIS — C44501 Unspecified malignant neoplasm of skin of breast: Secondary | ICD-10-CM | POA: Diagnosis not present

## 2020-04-26 DIAGNOSIS — M25511 Pain in right shoulder: Secondary | ICD-10-CM

## 2020-04-26 DIAGNOSIS — R293 Abnormal posture: Secondary | ICD-10-CM

## 2020-04-26 DIAGNOSIS — Z5111 Encounter for antineoplastic chemotherapy: Secondary | ICD-10-CM | POA: Diagnosis not present

## 2020-04-26 DIAGNOSIS — M25611 Stiffness of right shoulder, not elsewhere classified: Secondary | ICD-10-CM | POA: Diagnosis not present

## 2020-04-26 MED ORDER — DENOSUMAB 120 MG/1.7ML ~~LOC~~ SOLN
SUBCUTANEOUS | Status: AC
  Filled 2020-04-26: qty 1.7

## 2020-04-26 MED ORDER — DENOSUMAB 120 MG/1.7ML ~~LOC~~ SOLN
120.0000 mg | Freq: Once | SUBCUTANEOUS | Status: AC
  Administered 2020-04-26: 120 mg via SUBCUTANEOUS

## 2020-04-26 MED ORDER — FULVESTRANT 250 MG/5ML IM SOLN
500.0000 mg | Freq: Once | INTRAMUSCULAR | Status: AC
  Administered 2020-04-26: 500 mg via INTRAMUSCULAR

## 2020-04-26 MED ORDER — FULVESTRANT 250 MG/5ML IM SOLN
INTRAMUSCULAR | Status: AC
  Filled 2020-04-26: qty 10

## 2020-04-26 NOTE — Patient Instructions (Signed)
Denosumab injection What is this medicine? DENOSUMAB (den oh sue mab) slows bone breakdown. Prolia is used to treat osteoporosis in women after menopause and in men, and in people who are taking corticosteroids for 6 months or more. Xgeva is used to treat a high calcium level due to cancer and to prevent bone fractures and other bone problems caused by multiple myeloma or cancer bone metastases. Xgeva is also used to treat giant cell tumor of the bone. This medicine may be used for other purposes; ask your health care provider or pharmacist if you have questions. COMMON BRAND NAME(S): Prolia, XGEVA What should I tell my health care provider before I take this medicine? They need to know if you have any of these conditions:  dental disease  having surgery or tooth extraction  infection  kidney disease  low levels of calcium or Vitamin D in the blood  malnutrition  on hemodialysis  skin conditions or sensitivity  thyroid or parathyroid disease  an unusual reaction to denosumab, other medicines, foods, dyes, or preservatives  pregnant or trying to get pregnant  breast-feeding How should I use this medicine? This medicine is for injection under the skin. It is given by a health care professional in a hospital or clinic setting. A special MedGuide will be given to you before each treatment. Be sure to read this information carefully each time. For Prolia, talk to your pediatrician regarding the use of this medicine in children. Special care may be needed. For Xgeva, talk to your pediatrician regarding the use of this medicine in children. While this drug may be prescribed for children as young as 13 years for selected conditions, precautions do apply. Overdosage: If you think you have taken too much of this medicine contact a poison control center or emergency room at once. NOTE: This medicine is only for you. Do not share this medicine with others. What if I miss a dose? It is  important not to miss your dose. Call your doctor or health care professional if you are unable to keep an appointment. What may interact with this medicine? Do not take this medicine with any of the following medications:  other medicines containing denosumab This medicine may also interact with the following medications:  medicines that lower your chance of fighting infection  steroid medicines like prednisone or cortisone This list may not describe all possible interactions. Give your health care provider a list of all the medicines, herbs, non-prescription drugs, or dietary supplements you use. Also tell them if you smoke, drink alcohol, or use illegal drugs. Some items may interact with your medicine. What should I watch for while using this medicine? Visit your doctor or health care professional for regular checks on your progress. Your doctor or health care professional may order blood tests and other tests to see how you are doing. Call your doctor or health care professional for advice if you get a fever, chills or sore throat, or other symptoms of a cold or flu. Do not treat yourself. This drug may decrease your body's ability to fight infection. Try to avoid being around people who are sick. You should make sure you get enough calcium and vitamin D while you are taking this medicine, unless your doctor tells you not to. Discuss the foods you eat and the vitamins you take with your health care professional. See your dentist regularly. Brush and floss your teeth as directed. Before you have any dental work done, tell your dentist you are   receiving this medicine. Do not become pregnant while taking this medicine or for 5 months after stopping it. Talk with your doctor or health care professional about your birth control options while taking this medicine. Women should inform their doctor if they wish to become pregnant or think they might be pregnant. There is a potential for serious side  effects to an unborn child. Talk to your health care professional or pharmacist for more information. What side effects may I notice from receiving this medicine? Side effects that you should report to your doctor or health care professional as soon as possible:  allergic reactions like skin rash, itching or hives, swelling of the face, lips, or tongue  bone pain  breathing problems  dizziness  jaw pain, especially after dental work  redness, blistering, peeling of the skin  signs and symptoms of infection like fever or chills; cough; sore throat; pain or trouble passing urine  signs of low calcium like fast heartbeat, muscle cramps or muscle pain; pain, tingling, numbness in the hands or feet; seizures  unusual bleeding or bruising  unusually weak or tired Side effects that usually do not require medical attention (report to your doctor or health care professional if they continue or are bothersome):  constipation  diarrhea  headache  joint pain  loss of appetite  muscle pain  runny nose  tiredness  upset stomach This list may not describe all possible side effects. Call your doctor for medical advice about side effects. You may report side effects to FDA at 1-800-FDA-1088. Where should I keep my medicine? This medicine is only given in a clinic, doctor's office, or other health care setting and will not be stored at home. NOTE: This sheet is a summary. It may not cover all possible information. If you have questions about this medicine, talk to your doctor, pharmacist, or health care provider.  2021 Elsevier/Gold Standard (2017-07-17 16:10:44) Fulvestrant injection What is this medicine? FULVESTRANT (ful VES trant) blocks the effects of estrogen. It is used to treat breast cancer. This medicine may be used for other purposes; ask your health care provider or pharmacist if you have questions. COMMON BRAND NAME(S): FASLODEX What should I tell my health care  provider before I take this medicine? They need to know if you have any of these conditions:  bleeding disorders  liver disease  low blood counts, like low white cell, platelet, or red cell counts  an unusual or allergic reaction to fulvestrant, other medicines, foods, dyes, or preservatives  pregnant or trying to get pregnant  breast-feeding How should I use this medicine? This medicine is for injection into a muscle. It is usually given by a health care professional in a hospital or clinic setting. Talk to your pediatrician regarding the use of this medicine in children. Special care may be needed. Overdosage: If you think you have taken too much of this medicine contact a poison control center or emergency room at once. NOTE: This medicine is only for you. Do not share this medicine with others. What if I miss a dose? It is important not to miss your dose. Call your doctor or health care professional if you are unable to keep an appointment. What may interact with this medicine?  medicines that treat or prevent blood clots like warfarin, enoxaparin, dalteparin, apixaban, dabigatran, and rivaroxaban This list may not describe all possible interactions. Give your health care provider a list of all the medicines, herbs, non-prescription drugs, or dietary supplements you use.   Also tell them if you smoke, drink alcohol, or use illegal drugs. Some items may interact with your medicine. What should I watch for while using this medicine? Your condition will be monitored carefully while you are receiving this medicine. You will need important blood work done while you are taking this medicine. Do not become pregnant while taking this medicine or for at least 1 year after stopping it. Women of child-bearing potential will need to have a negative pregnancy test before starting this medicine. Women should inform their doctor if they wish to become pregnant or think they might be pregnant. There is  a potential for serious side effects to an unborn child. Men should inform their doctors if they wish to father a child. This medicine may lower sperm counts. Talk to your health care professional or pharmacist for more information. Do not breast-feed an infant while taking this medicine or for 1 year after the last dose. What side effects may I notice from receiving this medicine? Side effects that you should report to your doctor or health care professional as soon as possible:  allergic reactions like skin rash, itching or hives, swelling of the face, lips, or tongue  feeling faint or lightheaded, falls  pain, tingling, numbness, or weakness in the legs  signs and symptoms of infection like fever or chills; cough; flu-like symptoms; sore throat  vaginal bleeding Side effects that usually do not require medical attention (report to your doctor or health care professional if they continue or are bothersome):  aches, pains  constipation  diarrhea  headache  hot flashes  nausea, vomiting  pain at site where injected  stomach pain This list may not describe all possible side effects. Call your doctor for medical advice about side effects. You may report side effects to FDA at 1-800-FDA-1088. Where should I keep my medicine? This drug is given in a hospital or clinic and will not be stored at home. NOTE: This sheet is a summary. It may not cover all possible information. If you have questions about this medicine, talk to your doctor, pharmacist, or health care provider.  2021 Elsevier/Gold Standard (2017-06-18 11:34:41)

## 2020-04-26 NOTE — Therapy (Signed)
[facility] [address] [place], [place], [address] Phone: [phone]   Fax:  [phone]  Physical Therapy Treatment  Patient Details  Name: Jade Mooney MRN: [id number] Date of Birth: 10-11-1945 Referring Provider (PT): Causey   Encounter Date: 04/26/2020   PT End of Session - 04/26/20 1458    Visit Number 8    Number of Visits 17    Date for PT Re-Evaluation 05/24/20    PT Start Time 1405    PT Stop Time 1459    PT Time Calculation (min) 54 min    Activity Tolerance Patient tolerated treatment well    Behavior During Therapy [facility] for tasks assessed/performed           Past Medical History:  Diagnosis Date  . Anxiety   . Cancer [facility])    Breast 1997 right tx with mastectomy and chemo, metastatic now  . GERD (gastroesophageal reflux disease)   . Hypercholesterolemia   . Hypertension   . Hypothyroidism   . Osteoarthritis    oa  . Personal history of chemotherapy   . Personal history of radiation therapy   . Secondary malignant neoplasm of bone and bone marrow (HCC)   . TIA (transient ischemic attack) last 11-08-15   x 2 total    Past Surgical History:  Procedure Laterality Date  . CATARACT EXTRACTION, BILATERAL    . MASTECTOMY MODIFIED RADICAL Right 1997  . porta cath insertion  1997   later removed  . radiation tx  finished 02-25-16   x 20 tx  . TONSILLECTOMY    . TOTAL HIP ARTHROPLASTY Left 03/12/2016   Procedure: LEFT TOTAL HIP ARTHROPLASTY ANTERIOR APPROACH;  Surgeon: Ollen Gross, MD;  Location: [facility];  Service: Orthopedics;  Laterality: Left;  . TOTAL KNEE ARTHROPLASTY Left   . TUBAL LIGATION      There were no vitals filed for this visit.   Subjective Assessment - 04/26/20 1405    Subjective The pain is good. The left side of my back is a little sore still. My shoulders flare up but then the pain goes away.    Pertinent History s/p Right mastectomy 1997 for an estrogen receptor and  HER-2 positive breast cancer, treated with adjuvant chemotherapy according to CALGB 9640 (taxotere x 4 cycles), high-dose chemotherapy (cyclophosphamide, carboplatin, BCNU) followed by stem cell rescue at [facility] 12/21/17, biopsy-proven metastatic disease November 2007, estrogen receptor and HER-2 positive, tamoxifen completed 2002, status post right iliac crest biopsy 10/10/2013 for invasive ductal carcinoma, grade 2, estrogen and progesterone receptor positive, zoledronate started 10/12/2013,  repeated every 12 weeks, biopsy of a scalp lesion 10/17/2013 showed metastatic breast cancer, HER-2/neu negative, biopsy of a right anterior chest wall nodule August 2017 shows adenocarcinoma, anastrozole started 10/28/2013, changed to letrozole 06/27/2016, new skin lesion removed from right anterior chest wall August 2017, status post radiation to the anterior lower right chest wall completed 02/25/2017 (20 sessions) PET scan 02/03/2017 shows her bone metastases, and in addition a 0.5 cm right middle lobe nodule which may be new, MRI of the cervical spine shows collapse of the left lateral mass of C3.  There is no soft tissue mass or focal neural impingement. palbociclib reduced to 75 mg as of September 2018, palbociclib held during cervical spine irradiation finished in mid February, 201l, letrozole discontinued February 2019 secondary to likely progression, neoplasia associated pain, painful blastic lesion left femoral intertrochanteric region: Status post radiation completed November 2015, pain left knee, status post TKR  remotely, left total hip replacement 02/2016, palliative radiation to the cervical spine completed 05/08/2017 in [place], PET scan 01/03/2020 finds no evidence of disease activity    Patient Stated Goals to get rid of the pain under the breast    Currently in Pain? No/denies    Pain Score 0-No pain              [facility] PT Assessment - 04/26/20 0001      AROM   Right Shoulder ABduction 165  Degrees                         [facility] Adult PT Treatment/Exercise - 04/26/20 0001      Manual Therapy   Soft tissue mobilization in supine to anterior chest where pt had previous radiation in numerous areas of tightness and discomfort, these are much improved also to R pec muscle and to L deltoid and across rotator cuff tendons using cross friction massage since pt has been having pain in that area recent                       PT Long Term Goals - 04/26/20 1409      PT LONG TERM GOAL #1   Title Pt will report 75% improvement in pain in inferior right chest to allow improved comfort.    Baseline 04/26/20- 90% improvement    Time 4    Period Weeks    Status Achieved      PT LONG TERM GOAL #2   Title Pt will demonstrate 150 degrees of right shoulder abduction to allow her to reach out to the sides without limitation by tight pec muscle.    Baseline 138; 04/26/20- 165 degrees    Time 4    Period Weeks    Status Achieved      PT LONG TERM GOAL #3   Title Pt will be independent in a home exercise program for continued strengthening and stretching.    Time 4    Period Weeks    Status On-going      PT LONG TERM GOAL #4   Title Pt will demonstrate improved right scapular positioning for optimal R shoulder function.    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    Time 4    Period Weeks    Status New    Target Date 05/24/20                 Plan - 04/26/20 1452    Clinical Impression Statement Assessed pt's progress towards goals in therapy. She has met her goal for pain in anterior chest/rib pain. Her back on the left side is still sore. She has been having trouble with her IT band and having increased pain in this area. She has pain getting in and out of the car  and sitting in certain positions.  Continued with soft tissue mobilization to R chest and to left deltoid in area of pain. Pt would benefit from skilled PT services to continue to progress pt towards independence with a home exercise program and to decrease musculoskeletal pain and improve IT band pain. Will add appropriate  goals for this.    PT Frequency 2x / week    PT Duration 4 weeks    PT Treatment/Interventions ADLs/Self Care Home Management;Therapeutic exercise;Therapeutic activities;Patient/family education;Manual techniques;Scar mobilization;Passive range of motion;Taping    PT Next Visit Plan address ITB, assess indep with new exercises, continue gentle strengthening of R scap - no resistance due to bone mets, STM to inferior R chest and R rhomboids, stretching R pec    PT Home Exercise Plan scapular squeezes, supine scap series with no resisitance, pelvic tilts, meeks decompression    Consulted and Agree with Plan of Care Patient           Patient will benefit from skilled therapeutic intervention in order to improve the following deficits and impairments:  Pain,Postural dysfunction,Increased fascial restricitons,Decreased strength,Decreased range of motion,Decreased scar mobility  Visit Diagnosis: Pain in left leg  Stiffness of right shoulder, not elsewhere classified  Acute pain of right shoulder  Disorder of the skin and subcutaneous tissue related to radiation, unspecified  Muscle weakness (generalized)  Abnormal posture     Problem List Patient Active Problem List   Diagnosis Date Noted  . Aortic atherosclerosis ([place]) 08/24/2017  . Malignant neoplasm of overlapping sites of right breast in female, estrogen receptor positive ([place]) 04/21/2016  . Goals of care, counseling/discussion 04/21/2016  . OA (osteoarthritis) of hip 03/12/2016  . Pain from bone metastases ([place]) 10/02/2015  . Metastatic breast cancer ([place]) 07/12/2015  . Breast cancer metastasized to bone  ([place]) 10/12/2013  . TIA (transient ischemic attack)     Allyson Sabal [facility] 04/26/2020, 3:02 PM  [place], [place], [address] Phone: [phone]   Fax:  [phone]  Name: Jade Mooney MRN: [id number] Date of Birth: [birth year]  Allyson Sabal [place], PT 04/26/20 3:02 PM

## 2020-04-30 ENCOUNTER — Encounter: Payer: Self-pay | Admitting: Physical Therapy

## 2020-04-30 ENCOUNTER — Other Ambulatory Visit: Payer: Self-pay

## 2020-04-30 ENCOUNTER — Other Ambulatory Visit: Payer: Self-pay | Admitting: Oncology

## 2020-04-30 ENCOUNTER — Ambulatory Visit
Admission: RE | Admit: 2020-04-30 | Discharge: 2020-04-30 | Disposition: A | Discharge status: Home / Self Care | Payer: Medicare Other | Source: Ambulatory Visit | Attending: Oncology | Admitting: Oncology

## 2020-04-30 ENCOUNTER — Ambulatory Visit: Payer: Medicare Other | Admitting: Physical Therapy

## 2020-04-30 DIAGNOSIS — M79605 Pain in left leg: Secondary | ICD-10-CM

## 2020-04-30 DIAGNOSIS — L599 Disorder of the skin and subcutaneous tissue related to radiation, unspecified: Secondary | ICD-10-CM | POA: Diagnosis not present

## 2020-04-30 DIAGNOSIS — Z1231 Encounter for screening mammogram for malignant neoplasm of breast: Secondary | ICD-10-CM

## 2020-04-30 DIAGNOSIS — R293 Abnormal posture: Secondary | ICD-10-CM | POA: Diagnosis not present

## 2020-04-30 DIAGNOSIS — M6281 Muscle weakness (generalized): Secondary | ICD-10-CM | POA: Diagnosis not present

## 2020-04-30 DIAGNOSIS — M25511 Pain in right shoulder: Secondary | ICD-10-CM | POA: Diagnosis not present

## 2020-04-30 DIAGNOSIS — M25611 Stiffness of right shoulder, not elsewhere classified: Secondary | ICD-10-CM | POA: Diagnosis not present

## 2020-04-30 NOTE — Patient Instructions (Signed)
Access Code: [email or web address] URL: [email or web address] Date: 04/30/2020 Prepared by: Manus Gunning  Exercises ITB Stretch at Wall - 1 x daily - 7 x weekly - 1 sets - 3 reps - 30-60 sec hold Supine ITB Stretch with Strap - 1 x daily - 7 x weekly - 1 sets - 3 reps - 30-60 sec hold

## 2020-04-30 NOTE — Therapy (Signed)
[place], [place], [address] Phone: [phone]   Fax:  [phone]  Physical Therapy Treatment  Patient Details  Name: Jade Mooney MRN: [id number] Date of Birth: 15-Jun-1945 Referring Provider (PT): Causey   Encounter Date: 04/30/2020   PT End of Session - 04/30/20 1143    Visit Number 9    Number of Visits 17    Date for PT Re-Evaluation 05/24/20    PT Start Time 1030    PT Stop Time 1135    PT Time Calculation (min) 65 min    Activity Tolerance Patient tolerated treatment well    Behavior During Therapy [facility] for tasks assessed/performed           Past Medical History:  Diagnosis Date  . Anxiety   . Cancer [facility])    Breast 1997 right tx with mastectomy and chemo, metastatic now  . GERD (gastroesophageal reflux disease)   . Hypercholesterolemia   . Hypertension   . Hypothyroidism   . Osteoarthritis    oa  . Personal history of chemotherapy   . Personal history of radiation therapy   . Secondary malignant neoplasm of bone and bone marrow ([place])   . TIA (transient ischemic attack) last 11-08-15   x 2 total    Past Surgical History:  Procedure Laterality Date  . CATARACT EXTRACTION, BILATERAL    . MASTECTOMY MODIFIED RADICAL Right 1997  . porta cath insertion  1997   later removed  . radiation tx  finished 02-25-16   x 20 tx  . TONSILLECTOMY    . TOTAL HIP ARTHROPLASTY Left 03/12/2016   Procedure: LEFT TOTAL HIP ARTHROPLASTY ANTERIOR APPROACH;  Surgeon: Gaynelle Arabian, MD;  Location: [facility];  Service: Orthopedics;  Laterality: Left;  . TOTAL KNEE ARTHROPLASTY Left   . TUBAL LIGATION      There were no vitals filed for this visit.   Subjective Assessment - 04/30/20 1034    Subjective I have been doing my IT band stretches - the windmill and lying down    Pertinent History s/p Right mastectomy 1997 for an estrogen receptor and HER-2 positive breast cancer, treated with  adjuvant chemotherapy according to CALGB 9640 (taxotere x 4 cycles), high-dose chemotherapy (cyclophosphamide, carboplatin, BCNU) followed by stem cell rescue at [facility] 12/21/17, biopsy-proven metastatic disease November 2007, estrogen receptor and HER-2 positive, tamoxifen completed 2002, status post right iliac crest biopsy 10/10/2013 for invasive ductal carcinoma, grade 2, estrogen and progesterone receptor positive, zoledronate started 10/12/2013,  repeated every 12 weeks, biopsy of a scalp lesion 10/17/2013 showed metastatic breast cancer, HER-2/neu negative, biopsy of a right anterior chest wall nodule August 2017 shows adenocarcinoma, anastrozole started 10/28/2013, changed to letrozole 06/27/2016, new skin lesion removed from right anterior chest wall August 2017, status post radiation to the anterior lower right chest wall completed 02/25/2017 (20 sessions) PET scan 02/03/2017 shows her bone metastases, and in addition a 0.5 cm right middle lobe nodule which may be new, MRI of the cervical spine shows collapse of the left lateral mass of C3.  There is no soft tissue mass or focal neural impingement. palbociclib reduced to 75 mg as of September 2018, palbociclib held during cervical spine irradiation finished in mid February, 201l, letrozole discontinued February 2019 secondary to likely progression, neoplasia associated pain, painful blastic lesion left femoral intertrochanteric region: Status post radiation completed November 2015, pain left knee, status post TKR remotely, left total hip replacement 02/2016, palliative radiation to the cervical  spine completed 05/08/2017 in [place], PET scan 01/03/2020 finds no evidence of disease activity    Patient Stated Goals to get rid of the pain under the breast    Currently in Pain? No/denies    Pain Score 0-No pain                             [facility] Adult PT Treatment/Exercise - 04/30/20 0001      Manual Therapy   Soft tissue mobilization  in right sidelying to left IT band using cocoa butter from along entire portion to decrease pain and tightness                  PT Education - 04/30/20 1152    Education Details Access Code: [email or web address]  URL: [email or web address]  Date: 04/30/2020  Prepared by: Manus Gunning    Exercises  ITB Stretch at [employer] - 1 x daily - 7 x weekly - 1 sets - 3 reps - 30-60 sec hold  Supine ITB Stretch with Strap - 1 x daily - 7 x weekly - 1 sets - 3 reps - 30-60 sec hold    Person(s) Educated Patient    Methods Explanation;Demonstration;Handout    Comprehension Verbalized understanding               PT Long Term Goals - 04/26/20 1409      PT LONG TERM GOAL #1   Title Pt will report 75% improvement in pain in inferior right chest to allow improved comfort.    Baseline 04/26/20- 90% improvement    Time 4    Period Weeks    Status Achieved      PT LONG TERM GOAL #2   Title Pt will demonstrate 150 degrees of right shoulder abduction to allow her to reach out to the sides without limitation by tight pec muscle.    Baseline 138; 04/26/20- 165 degrees    Time 4    Period Weeks    Status Achieved      PT LONG TERM GOAL #3   Title Pt will be independent in a home exercise program for continued strengthening and stretching.    Time 4    Period Weeks    Status On-going      PT LONG TERM GOAL #4   Title Pt will demonstrate improved right scapular positioning for optimal R shoulder function.    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    Time 4    Period Weeks    Status New    Target Date 05/24/20                 Plan - 04/30/20 1144    Clinical Impression Statement Focused on improving pain in L leg by doing soft tissue mobilization to pt's LLE. Had pt demonstrate the  exercises she has been doing to stretch her IT band and then educated pt in new IT band stretches including in supine with strap and also in standing at wall. Pt had increased tightness near insertion of IT band but this did improve with treatment today. Pt did not have any pain when getting up  or getting dressed and pt reports sometimes she has sharp pain with this. Educated pt in new stretches today for her IT band to add to her current stretches.    PT Frequency 2x / week    PT Duration 4 weeks    PT Treatment/Interventions ADLs/Self Care Home Management;Therapeutic exercise;Therapeutic activities;Patient/family education;Manual techniques;Scar mobilization;Passive range of motion;Taping    PT Next Visit Plan address ITB, assess indep with new exercises, continue gentle strengthening of R scap - no resistance due to bone mets, STM to inferior R chest and R rhomboids, stretching R pec    PT Home Exercise Plan scapular squeezes, supine scap series with no resisitance, pelvic tilts, meeks decompression;Access Code: [email or web address]    Consulted and Agree with Plan of Care Patient           Patient will benefit from skilled therapeutic intervention in order to improve the following deficits and impairments:  Pain,Postural dysfunction,Increased fascial restricitons,Decreased strength,Decreased range of motion,Decreased scar mobility  Visit Diagnosis: Pain in left leg     Problem List Patient Active Problem List   Diagnosis Date Noted  . Aortic atherosclerosis ([place]) 08/24/2017  . Malignant neoplasm of overlapping sites of right breast in female, estrogen receptor positive ([place]) 04/21/2016  . Goals of care, counseling/discussion 04/21/2016  . OA (osteoarthritis) of hip 03/12/2016  . Pain from bone metastases ([place]) 10/02/2015  . Metastatic breast cancer ([place]) 07/12/2015  . Breast cancer metastasized to bone ([place]) 10/12/2013  . TIA (transient ischemic attack)     Allyson Sabal [facility] 04/30/2020,  11:53 AM  [place], [place], [address] Phone: [phone]   Fax:  [phone]  Name: Jade Mooney MRN: [id number] Date of Birth: [birth year]  Manus Gunning, PT 04/30/20 11:55 AM

## 2020-05-01 ENCOUNTER — Encounter: Payer: Medicare Other | Admitting: Physical Therapy

## 2020-05-01 DIAGNOSIS — D485 Neoplasm of uncertain behavior of skin: Secondary | ICD-10-CM | POA: Diagnosis not present

## 2020-05-02 ENCOUNTER — Ambulatory Visit: Payer: Medicare Other | Admitting: Physical Therapy

## 2020-05-02 ENCOUNTER — Other Ambulatory Visit: Payer: Self-pay

## 2020-05-02 ENCOUNTER — Encounter: Payer: Self-pay | Admitting: Physical Therapy

## 2020-05-02 DIAGNOSIS — M25611 Stiffness of right shoulder, not elsewhere classified: Secondary | ICD-10-CM | POA: Diagnosis not present

## 2020-05-02 DIAGNOSIS — R293 Abnormal posture: Secondary | ICD-10-CM | POA: Diagnosis not present

## 2020-05-02 DIAGNOSIS — M6281 Muscle weakness (generalized): Secondary | ICD-10-CM | POA: Diagnosis not present

## 2020-05-02 DIAGNOSIS — M79605 Pain in left leg: Secondary | ICD-10-CM

## 2020-05-02 DIAGNOSIS — M25511 Pain in right shoulder: Secondary | ICD-10-CM | POA: Diagnosis not present

## 2020-05-02 DIAGNOSIS — L599 Disorder of the skin and subcutaneous tissue related to radiation, unspecified: Secondary | ICD-10-CM | POA: Diagnosis not present

## 2020-05-02 NOTE — Therapy (Signed)
[place]ade [place], [place], [address] Phone: [phone]   Fax:  [phone]  Physical Therapy Treatment  Patient Details  Name: Jade Mooney MRN: [id number] Date of Birth: 17-Oct-1945 Referring Provider (PT): Causey   Encounter Date: 05/02/2020   PT End of Session - 05/02/20 1053    Visit Number 10    Number of Visits 17    Date for PT Re-Evaluation 05/24/20    PT Start Time 1005    PT Stop Time 1058    PT Time Calculation (min) 53 min    Activity Tolerance Patient tolerated treatment well    Behavior During Therapy [facility] for tasks assessed/performed           Past Medical History:  Diagnosis Date  . Anxiety   . Cancer [facility])    Breast 1997 right tx with mastectomy and chemo, metastatic now  . GERD (gastroesophageal reflux disease)   . Hypercholesterolemia   . Hypertension   . Hypothyroidism   . Osteoarthritis    oa  . Personal history of chemotherapy   . Personal history of radiation therapy   . Secondary malignant neoplasm of bone and bone marrow ([place])   . TIA (transient ischemic attack) last 11-08-15   x 2 total    Past Surgical History:  Procedure Laterality Date  . CATARACT EXTRACTION, BILATERAL    . MASTECTOMY MODIFIED RADICAL Right 1997  . porta cath insertion  1997   later removed  . radiation tx  finished 02-25-16   x 20 tx  . TONSILLECTOMY    . TOTAL HIP ARTHROPLASTY Left 03/12/2016   Procedure: LEFT TOTAL HIP ARTHROPLASTY ANTERIOR APPROACH;  Surgeon: Gaynelle Arabian, MD;  [facility];  Service: Orthopedics;  Laterality: Left;  . TOTAL KNEE ARTHROPLASTY Left   . TUBAL LIGATION      There were no vitals filed for this visit.   Subjective Assessment - 05/02/20 1006    Subjective My leg did well. It does feel so much better. I did the exercises.    Pertinent History s/p Right mastectomy 1997 for an estrogen receptor and HER-2 positive breast cancer, treated with  adjuvant chemotherapy according to CALGB 9640 (taxotere x 4 cycles), high-dose chemotherapy (cyclophosphamide, carboplatin, BCNU) followed by stem cell rescue at [facility] 12/21/17, biopsy-proven metastatic disease November 2007, estrogen receptor and HER-2 positive, tamoxifen completed 2002, status post right iliac crest biopsy 10/10/2013 for invasive ductal carcinoma, grade 2, estrogen and progesterone receptor positive, zoledronate started 10/12/2013,  repeated every 12 weeks, biopsy of a scalp lesion 10/17/2013 showed metastatic breast cancer, HER-2/neu negative, biopsy of a right anterior chest wall nodule August 2017 shows adenocarcinoma, anastrozole started 10/28/2013, changed to letrozole 06/27/2016, new skin lesion removed from right anterior chest wall August 2017, status post radiation to the anterior lower right chest wall completed 02/25/2017 (20 sessions) PET scan 02/03/2017 shows her bone metastases, and in addition a 0.5 cm right middle lobe nodule which may be new, MRI of the cervical spine shows collapse of the left lateral mass of C3.  There is no soft tissue mass or focal neural impingement. palbociclib reduced to 75 mg as of September 2018, palbociclib held during cervical spine irradiation finished in mid February, 201l, letrozole discontinued February 2019 secondary to likely progression, neoplasia associated pain, painful blastic lesion left femoral intertrochanteric region: Status post radiation completed November 2015, pain left knee, status post TKR remotely, left total hip replacement 02/2016, palliative radiation to the cervical  spine completed 05/08/2017 in [place], PET scan 01/03/2020 finds no evidence of disease activity    Patient Stated Goals to get rid of the pain under the breast    Currently in Pain? No/denies    Pain Score 0-No pain                             [facility] Adult PT Treatment/Exercise - 05/02/20 0001      Exercises   Other Exercises  Instructed  pt in exercises to help reduce hip bursitis pain and IT band pain: clams in sidelying, figure 4 stretch in supine, hip abduction in sidelying      Manual Therapy   Soft tissue mobilization in right sidelying to left IT band using cocoa butter from along entire portion to decrease pain and tightness and to bilateral rhomboids in area of discomfort                       PT Long Term Goals - 04/26/20 1409      PT LONG TERM GOAL #1   Title Pt will report 75% improvement in pain in inferior right chest to allow improved comfort.    Baseline 04/26/20- 90% improvement    Time 4    Period Weeks    Status Achieved      PT LONG TERM GOAL #2   Title Pt will demonstrate 150 degrees of right shoulder abduction to allow her to reach out to the sides without limitation by tight pec muscle.    Baseline 138; 04/26/20- 165 degrees    Time 4    Period Weeks    Status Achieved      PT LONG TERM GOAL #3   Title Pt will be independent in a home exercise program for continued strengthening and stretching.    Time 4    Period Weeks    Status On-going      PT LONG TERM GOAL #4   Title Pt will demonstrate improved right scapular positioning for optimal R shoulder function.    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    Time 4    Period Weeks    Status New    Target Date 05/24/20                 Plan - 05/02/20 1021    Clinical Impression Statement Pt felt the soft tissue mobilization helped her IT band pain yesterday so continued with this today. Pt is still very tight near insertion of IT band and is still tender throughout IT band. They worked on soft tissue mobilization to bilateral rhomboids since pt feels discomfort here most likely due to her posture exercises and muscle fatigue in this  area. Instructed pt in new exercises for IT band stretching and hip bursitis.    PT Frequency 2x / week    PT Duration 4 weeks    PT Treatment/Interventions ADLs/Self Care Home Management;Therapeutic exercise;Therapeutic activities;Patient/family education;Manual techniques;Scar mobilization;Passive range of motion;Taping    PT Next Visit Plan cont STM to  ITB, assess indep with new exercises, continue gentle strengthening of R scap - no resistance  due to bone mets, STM to inferior R chest and R rhomboids, stretching R pec    PT Home Exercise Plan scapular squeezes, supine scap series with no resisitance, pelvic tilts, meeks decompression;Access Code: [email or web address]    Consulted and Agree with Plan of Care Patient           Patient will benefit from skilled therapeutic intervention in order to improve the following deficits and impairments:  Pain,Postural dysfunction,Increased fascial restricitons,Decreased strength,Decreased range of motion,Decreased scar mobility  Visit Diagnosis: Pain in left leg  Muscle weakness (generalized)     Problem List Patient Active Problem List   Diagnosis Date Noted  . Aortic atherosclerosis ([place]) 08/24/2017  . Malignant neoplasm of overlapping sites of right breast in female, estrogen receptor positive ([place]) 04/21/2016  . Goals of care, counseling/discussion 04/21/2016  . OA (osteoarthritis) of hip 03/12/2016  . Pain from bone metastases ([place]) 10/02/2015  . Metastatic breast cancer ([place]) 07/12/2015  . Breast cancer metastasized to bone ([place]) 10/12/2013  . TIA (transient ischemic attack)     Allyson Sabal [facility] 05/02/2020, 11:07 AM  [place], [place], [address] Phone: [phone]   Fax:  [phone]  Name: Ellarose Brandi MRN: [id number] Date of Birth: [birth year]  Manus Gunning, PT 05/02/20 11:07 AM

## 2020-05-02 NOTE — Patient Instructions (Signed)
Access Code: [email or web address] URL: [email or web address] Date: 05/02/2020 Prepared by: Manus Gunning  Exercises ITB Stretch at [employer] - 1 x daily - 7 x weekly - 1 sets - 3 reps - 30-60 sec hold Supine ITB Stretch with Strap - 1 x daily - 7 x weekly - 1 sets - 3 reps - 30-60 sec hold Sidelying Hip Abduction - 1 x daily - 7 x weekly - 1 sets - 10 reps Clam - 1 x daily - 7 x weekly - 1 sets - 10 reps Supine Figure 4 Piriformis Stretch - 1 x daily - 7 x weekly - 1 sets - 3 reps - 15-30 sec hold

## 2020-05-08 ENCOUNTER — Encounter: Payer: Self-pay | Admitting: Physical Therapy

## 2020-05-08 ENCOUNTER — Other Ambulatory Visit: Payer: Self-pay

## 2020-05-08 ENCOUNTER — Ambulatory Visit: Payer: Medicare Other | Admitting: Physical Therapy

## 2020-05-08 DIAGNOSIS — M25611 Stiffness of right shoulder, not elsewhere classified: Secondary | ICD-10-CM | POA: Diagnosis not present

## 2020-05-08 DIAGNOSIS — M79605 Pain in left leg: Secondary | ICD-10-CM

## 2020-05-08 DIAGNOSIS — D044 Carcinoma in situ of skin of scalp and neck: Secondary | ICD-10-CM | POA: Diagnosis not present

## 2020-05-08 DIAGNOSIS — L599 Disorder of the skin and subcutaneous tissue related to radiation, unspecified: Secondary | ICD-10-CM | POA: Diagnosis not present

## 2020-05-08 DIAGNOSIS — R293 Abnormal posture: Secondary | ICD-10-CM | POA: Diagnosis not present

## 2020-05-08 DIAGNOSIS — M25511 Pain in right shoulder: Secondary | ICD-10-CM | POA: Diagnosis not present

## 2020-05-08 DIAGNOSIS — M6281 Muscle weakness (generalized): Secondary | ICD-10-CM | POA: Diagnosis not present

## 2020-05-08 NOTE — Therapy (Signed)
[place], [place], [address] Phone: [phone]   Fax:  [phone]  Physical Therapy Treatment  Patient Details  Name: Jade Mooney MRN: [id number] Date of Birth: Mar 22, 1946 Referring Provider (PT): Causey   Encounter Date: 05/08/2020   PT End of Session - 05/08/20 1058    Visit Number 11    Number of Visits 17    Date for PT Re-Evaluation 05/24/20    PT Start Time 1007    PT Stop Time 1056    PT Time Calculation (min) 49 min    Activity Tolerance Patient tolerated treatment well    Behavior During Therapy [facility] for tasks assessed/performed           Past Medical History:  Diagnosis Date  . Anxiety   . Cancer [facility])    Breast 1997 right tx with mastectomy and chemo, metastatic now  . GERD (gastroesophageal reflux disease)   . Hypercholesterolemia   . Hypertension   . Hypothyroidism   . Osteoarthritis    oa  . Personal history of chemotherapy   . Personal history of radiation therapy   . Secondary malignant neoplasm of bone and bone marrow ([place])   . TIA (transient ischemic attack) last 11-08-15   x 2 total    Past Surgical History:  Procedure Laterality Date  . CATARACT EXTRACTION, BILATERAL    . MASTECTOMY MODIFIED RADICAL Right 1997  . porta cath insertion  1997   later removed  . radiation tx  finished 02-25-16   x 20 tx  . TONSILLECTOMY    . TOTAL HIP ARTHROPLASTY Left 03/12/2016   Procedure: LEFT TOTAL HIP ARTHROPLASTY ANTERIOR APPROACH;  Surgeon: Gaynelle Arabian, MD;  [facility];  Service: Orthopedics;  Laterality: Left;  . TOTAL KNEE ARTHROPLASTY Left   . TUBAL LIGATION      There were no vitals filed for this visit.   Subjective Assessment - 05/08/20 1008    Subjective I did well. I think things are doing well. I still feel this left IT band. My chest seems to be doing really well. My left arm is very sore today after my shingles vaccine yesterday.     Pertinent History s/p Right mastectomy 1997 for an estrogen receptor and HER-2 positive breast cancer, treated with adjuvant chemotherapy according to CALGB 9640 (taxotere x 4 cycles), high-dose chemotherapy (cyclophosphamide, carboplatin, BCNU) followed by stem cell rescue at [facility] 12/21/17, biopsy-proven metastatic disease November 2007, estrogen receptor and HER-2 positive, tamoxifen completed 2002, status post right iliac crest biopsy 10/10/2013 for invasive ductal carcinoma, grade 2, estrogen and progesterone receptor positive, zoledronate started 10/12/2013,  repeated every 12 weeks, biopsy of a scalp lesion 10/17/2013 showed metastatic breast cancer, HER-2/neu negative, biopsy of a right anterior chest wall nodule August 2017 shows adenocarcinoma, anastrozole started 10/28/2013, changed to letrozole 06/27/2016, new skin lesion removed from right anterior chest wall August 2017, status post radiation to the anterior lower right chest wall completed 02/25/2017 (20 sessions) PET scan 02/03/2017 shows her bone metastases, and in addition a 0.5 cm right middle lobe nodule which may be new, MRI of the cervical spine shows collapse of the left lateral mass of C3.  There is no soft tissue mass or focal neural impingement. palbociclib reduced to 75 mg as of September 2018, palbociclib held during cervical spine irradiation finished in mid February, 201l, letrozole discontinued February 2019 secondary to likely progression, neoplasia associated pain, painful blastic lesion left femoral intertrochanteric region: Status  post radiation completed November 2015, pain left knee, status post TKR remotely, left total hip replacement 02/2016, palliative radiation to the cervical spine completed 05/08/2017 in [place], PET scan 01/03/2020 finds no evidence of disease activity    Patient Stated Goals to get rid of the pain under the breast    Currently in Pain? No/denies    Pain Score 0-No pain                              [facility] Adult PT Treatment/Exercise - 05/08/20 0001      Manual Therapy   Soft tissue mobilization in right sidelying to left IT band using cocoa butter from along entire portion to decrease pain and tightness with emphasis placed at greater trochanter where pt has increased sensitivity                       PT Long Term Goals - 04/26/20 1409      PT LONG TERM GOAL #1   Title Pt will report 75% improvement in pain in inferior right chest to allow improved comfort.    Baseline 04/26/20- 90% improvement    Time 4    Period Weeks    Status Achieved      PT LONG TERM GOAL #2   Title Pt will demonstrate 150 degrees of right shoulder abduction to allow her to reach out to the sides without limitation by tight pec muscle.    Baseline 138; 04/26/20- 165 degrees    Time 4    Period Weeks    Status Achieved      PT LONG TERM GOAL #3   Title Pt will be independent in a home exercise program for continued strengthening and stretching.    Time 4    Period Weeks    Status On-going      PT LONG TERM GOAL #4   Title Pt will demonstrate improved right scapular positioning for optimal R shoulder function.    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    Time 4    Period Weeks    Status New    Target Date 05/24/20                 Plan - 05/08/20 1058    Clinical Impression Statement Pt's chest spasms have improved greatly and pt is feeling decrease tightness in her shoulders. Her L hip and ITB is still bothering her. She has pin point pain at her greater trochanter so continued with soft tissue mobilization along L IT band. Edcuated pt that she may have bursitis and that she may need to follow up with her orthopedic doctor if she continues to have pain  despite soft tissue mobilization and stretching.    PT Frequency 2x / week    PT Duration 4 weeks    PT Treatment/Interventions ADLs/Self Care Home Management;Therapeutic exercise;Therapeutic activities;Patient/family education;Manual techniques;Scar mobilization;Passive range of motion;Taping    PT Next Visit Plan cont STM to  ITB, assess indep with new exercises, continue gentle strengthening of R scap - no resistance due to bone mets, STM to inferior R chest and R  rhomboids, stretching R pec    PT Home Exercise Plan scapular squeezes, supine scap series with no resisitance, pelvic tilts, meeks decompression;Access Code: [email or web address]    Consulted and Agree with Plan of Care Patient           Patient will benefit from skilled therapeutic intervention in order to improve the following deficits and impairments:  Pain,Postural dysfunction,Increased fascial restricitons,Decreased strength,Decreased range of motion,Decreased scar mobility  Visit Diagnosis: Pain in left leg     Problem List Patient Active Problem List   Diagnosis Date Noted  . Aortic atherosclerosis ([place]) 08/24/2017  . Malignant neoplasm of overlapping sites of right breast in female, estrogen receptor positive ([place]) 04/21/2016  . Goals of care, counseling/discussion 04/21/2016  . OA (osteoarthritis) of hip 03/12/2016  . Pain from bone metastases ([place]) 10/02/2015  . Metastatic breast cancer ([place]) 07/12/2015  . Breast cancer metastasized to bone ([place]) 10/12/2013  . TIA (transient ischemic attack)     Allyson Sabal [facility] 05/08/2020, 11:01 AM  [place], [place], [address] Phone: [phone]   Fax:  [phone]  Name: Chasta Deshpande MRN: [id number] Date of Birth: 31-Jan-1946  Manus Gunning, PT 05/08/20 11:01 AM

## 2020-05-10 ENCOUNTER — Encounter: Payer: Self-pay | Admitting: Physical Therapy

## 2020-05-10 ENCOUNTER — Ambulatory Visit: Payer: Medicare Other | Admitting: Physical Therapy

## 2020-05-10 ENCOUNTER — Other Ambulatory Visit: Payer: Self-pay

## 2020-05-10 DIAGNOSIS — M6281 Muscle weakness (generalized): Secondary | ICD-10-CM | POA: Diagnosis not present

## 2020-05-10 DIAGNOSIS — L599 Disorder of the skin and subcutaneous tissue related to radiation, unspecified: Secondary | ICD-10-CM | POA: Diagnosis not present

## 2020-05-10 DIAGNOSIS — M25611 Stiffness of right shoulder, not elsewhere classified: Secondary | ICD-10-CM | POA: Diagnosis not present

## 2020-05-10 DIAGNOSIS — M25511 Pain in right shoulder: Secondary | ICD-10-CM | POA: Diagnosis not present

## 2020-05-10 DIAGNOSIS — M79605 Pain in left leg: Secondary | ICD-10-CM | POA: Diagnosis not present

## 2020-05-10 DIAGNOSIS — R293 Abnormal posture: Secondary | ICD-10-CM | POA: Diagnosis not present

## 2020-05-10 DIAGNOSIS — E538 Deficiency of other specified B group vitamins: Secondary | ICD-10-CM | POA: Diagnosis not present

## 2020-05-10 NOTE — Therapy (Signed)
[place], [place], [address] Phone: [phone]   Fax:  [phone]  Physical Therapy Treatment  Patient Details  Name: Jade Mooney MRN: [id number] Date of Birth: 11-14-1945 Referring Provider (PT): Causey   Encounter Date: 05/10/2020   PT End of Session - 05/10/20 1351    Visit Number 12    Number of Visits 17    Date for PT Re-Evaluation 05/24/20    PT Start Time 1304    PT Stop Time 1352    PT Time Calculation (min) 48 min    Activity Tolerance Patient tolerated treatment well    Behavior During Therapy [facility] for tasks assessed/performed           Past Medical History:  Diagnosis Date  . Anxiety   . Cancer [facility])    Breast 1997 right tx with mastectomy and chemo, metastatic now  . GERD (gastroesophageal reflux disease)   . Hypercholesterolemia   . Hypertension   . Hypothyroidism   . Osteoarthritis    oa  . Personal history of chemotherapy   . Personal history of radiation therapy   . Secondary malignant neoplasm of bone and bone marrow ([place])   . TIA (transient ischemic attack) last 11-08-15   x 2 total    Past Surgical History:  Procedure Laterality Date  . CATARACT EXTRACTION, BILATERAL    . MASTECTOMY MODIFIED RADICAL Right 1997  . porta cath insertion  1997   later removed  . radiation tx  finished 02-25-16   x 20 tx  . TONSILLECTOMY    . TOTAL HIP ARTHROPLASTY Left 03/12/2016   Procedure: LEFT TOTAL HIP ARTHROPLASTY ANTERIOR APPROACH;  Surgeon: Gaynelle Arabian, MD;  Location: [facility];  Service: Orthopedics;  Laterality: Left;  . TOTAL KNEE ARTHROPLASTY Left   . TUBAL LIGATION      There were no vitals filed for this visit.   Subjective Assessment - 05/10/20 1305    Subjective My hip is feeling pretty good but when I got in the car this morning it talked to me.    Pertinent History s/p Right mastectomy 1997 for an estrogen receptor and HER-2 positive breast cancer,  treated with adjuvant chemotherapy according to CALGB 9640 (taxotere x 4 cycles), high-dose chemotherapy (cyclophosphamide, carboplatin, BCNU) followed by [facility] cell rescue at [facility] 12/21/17, biopsy-proven metastatic disease November 2007, estrogen receptor and HER-2 positive, tamoxifen completed 2002, status post right iliac crest biopsy 10/10/2013 for invasive ductal carcinoma, grade 2, estrogen and progesterone receptor positive, zoledronate started 10/12/2013,  repeated every 12 weeks, biopsy of a scalp lesion 10/17/2013 showed metastatic breast cancer, HER-2/neu negative, biopsy of a right anterior chest wall nodule August 2017 shows adenocarcinoma, anastrozole started 10/28/2013, changed to letrozole 06/27/2016, new skin lesion removed from right anterior chest wall August 2017, status post radiation to the anterior lower right chest wall completed 02/25/2017 (20 sessions) PET scan 02/03/2017 shows her bone metastases, and in addition a 0.5 cm right middle lobe nodule which may be new, MRI of the cervical spine shows collapse of the left lateral mass of C3.  There is no soft tissue mass or focal neural impingement. palbociclib reduced to 75 mg as of September 2018, palbociclib held during cervical spine irradiation finished in mid February, 201l, letrozole discontinued February 2019 secondary to likely progression, neoplasia associated pain, painful blastic lesion left femoral intertrochanteric region: Status post radiation completed November 2015, pain left knee, status post TKR remotely, left total hip replacement 02/2016,  palliative radiation to the cervical spine completed 05/08/2017 in [place], PET scan 01/03/2020 finds no evidence of disease activity    Patient Stated Goals to get rid of the pain under the breast    Currently in Pain? No/denies    Pain Score 0-No pain                             [facility] Adult PT Treatment/Exercise - 05/10/20 0001      Manual Therapy   Soft tissue  mobilization in right sidelying to left IT band using cocoa butter from along entire portion to decrease pain and tightness with emphasis placed at greater trochanter and surrounding musculature where pt has increased sensitivity                       PT Long Term Goals - 04/26/20 1409      PT LONG TERM GOAL #1   Title Pt will report 75% improvement in pain in inferior right chest to allow improved comfort.    Baseline 04/26/20- 90% improvement    Time 4    Period Weeks    Status Achieved      PT LONG TERM GOAL #2   Title Pt will demonstrate 150 degrees of right shoulder abduction to allow her to reach out to the sides without limitation by tight pec muscle.    Baseline 138; 04/26/20- 165 degrees    Time 4    Period Weeks    Status Achieved      PT LONG TERM GOAL #3   Title Pt will be independent in a home exercise program for continued strengthening and stretching.    Time 4    Period Weeks    Status On-going      PT LONG TERM GOAL #4   Title Pt will demonstrate improved right scapular positioning for optimal R shoulder function.    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    Time 4    Period Weeks    Status New    Target Date 05/24/20                 Plan - 05/10/20 1353    Clinical Impression Statement Pt feels that overall her pain is improving. She is still having a catching pain when she gets in and out of the car. Continued with manual therapy to ITB and musculature surround greater trochanter where pt has the most discomfort. Pt had increased sensitivity at beginning of session but this eased by end of sesion. Numerous areas of muscle tightness palpable that improved with treatment today.    PT Frequency 2x / week    PT Duration 4 weeks    PT  Treatment/Interventions ADLs/Self Care Home Management;Therapeutic exercise;Therapeutic activities;Patient/family education;Manual techniques;Scar mobilization;Passive range of motion;Taping    PT Next Visit Plan cont STM to  ITB, assess indep with new exercises, continue gentle strengthening of R scap - no resistance due to bone mets, STM to inferior R chest and R rhomboids, stretching R pec    PT Home Exercise Plan scapular squeezes, supine scap series with no resisitance, pelvic tilts,  meeks decompression;Access Code: [email or web address]    Consulted and Agree with Plan of Care Patient           Patient will benefit from skilled therapeutic intervention in order to improve the following deficits and impairments:  Pain,Postural dysfunction,Increased fascial restricitons,Decreased strength,Decreased range of motion,Decreased scar mobility  Visit Diagnosis: Pain in left leg     Problem List Patient Active Problem List   Diagnosis Date Noted  . Aortic atherosclerosis ([place]) 08/24/2017  . Malignant neoplasm of overlapping sites of right breast in female, estrogen receptor positive ([place]) 04/21/2016  . Goals of care, counseling/discussion 04/21/2016  . OA (osteoarthritis) of hip 03/12/2016  . Pain from bone metastases ([place]) 10/02/2015  . Metastatic breast cancer ([place]) 07/12/2015  . Breast cancer metastasized to bone ([place]) 10/12/2013  . TIA (transient ischemic attack)     Allyson Sabal [facility] 05/10/2020, 1:56 PM  [place], [place], [address] Phone: [phone]   Fax:  [phone]  Name: Callan Yontz MRN: [id number] Date of Birth: October 12, 1945  Manus Gunning, PT 05/10/20 1:56 PM

## 2020-05-11 DIAGNOSIS — E782 Mixed hyperlipidemia: Secondary | ICD-10-CM | POA: Diagnosis not present

## 2020-05-11 DIAGNOSIS — Z79899 Other long term (current) drug therapy: Secondary | ICD-10-CM | POA: Diagnosis not present

## 2020-05-11 DIAGNOSIS — E039 Hypothyroidism, unspecified: Secondary | ICD-10-CM | POA: Diagnosis not present

## 2020-05-14 ENCOUNTER — Encounter: Payer: Self-pay | Admitting: Physical Therapy

## 2020-05-14 ENCOUNTER — Other Ambulatory Visit: Payer: Self-pay

## 2020-05-14 ENCOUNTER — Ambulatory Visit: Payer: Medicare Other | Admitting: Physical Therapy

## 2020-05-14 DIAGNOSIS — M79605 Pain in left leg: Secondary | ICD-10-CM

## 2020-05-14 DIAGNOSIS — M25511 Pain in right shoulder: Secondary | ICD-10-CM | POA: Diagnosis not present

## 2020-05-14 DIAGNOSIS — M6281 Muscle weakness (generalized): Secondary | ICD-10-CM | POA: Diagnosis not present

## 2020-05-14 DIAGNOSIS — M25611 Stiffness of right shoulder, not elsewhere classified: Secondary | ICD-10-CM | POA: Diagnosis not present

## 2020-05-14 DIAGNOSIS — R293 Abnormal posture: Secondary | ICD-10-CM | POA: Diagnosis not present

## 2020-05-14 DIAGNOSIS — L599 Disorder of the skin and subcutaneous tissue related to radiation, unspecified: Secondary | ICD-10-CM | POA: Diagnosis not present

## 2020-05-14 NOTE — Therapy (Signed)
[place], [place], [address] Phone: [phone]   Fax:  [phone]  Physical Therapy Treatment  Patient Details  Name: Jade Mooney MRN: [id number] Date of Birth: 17-Jun-1945 Referring Provider (PT): Causey   Encounter Date: 05/14/2020   PT End of Session - 05/14/20 1102    Visit Number 13    Number of Visits 17    Date for PT Re-Evaluation 05/24/20    PT Start Time 1007    PT Stop Time 1058    PT Time Calculation (min) 51 min    Activity Tolerance Patient tolerated treatment well    Behavior During Therapy [facility] for tasks assessed/performed           Past Medical History:  Diagnosis Date  . Anxiety   . Cancer [facility])    Breast 1997 right tx with mastectomy and chemo, metastatic now  . GERD (gastroesophageal reflux disease)   . Hypercholesterolemia   . Hypertension   . Hypothyroidism   . Osteoarthritis    oa  . Personal history of chemotherapy   . Personal history of radiation therapy   . Secondary malignant neoplasm of bone and bone marrow ([place])   . TIA (transient ischemic attack) last 11-08-15   x 2 total    Past Surgical History:  Procedure Laterality Date  . CATARACT EXTRACTION, BILATERAL    . MASTECTOMY MODIFIED RADICAL Right 1997  . porta cath insertion  1997   later removed  . radiation tx  finished 02-25-16   x 20 tx  . TONSILLECTOMY    . TOTAL HIP ARTHROPLASTY Left 03/12/2016   Procedure: LEFT TOTAL HIP ARTHROPLASTY ANTERIOR APPROACH;  Surgeon: Gaynelle Arabian, MD;  Location: [facility];  Service: Orthopedics;  Laterality: Left;  . TOTAL KNEE ARTHROPLASTY Left   . TUBAL LIGATION      There were no vitals filed for this visit.   Subjective Assessment - 05/14/20 1008    Subjective My hip is doing great! Even doing my exercises it is better. I have not felt the catch getting in and out of the car.    Pertinent History s/p Right mastectomy 1997 for an estrogen receptor  and HER-2 positive breast cancer, treated with adjuvant chemotherapy according to CALGB 9640 (taxotere x 4 cycles), high-dose chemotherapy (cyclophosphamide, carboplatin, BCNU) followed by stem cell rescue at [facility] 12/21/17, biopsy-proven metastatic disease November 2007, estrogen receptor and HER-2 positive, tamoxifen completed 2002, status post right iliac crest biopsy 10/10/2013 for invasive ductal carcinoma, grade 2, estrogen and progesterone receptor positive, zoledronate started 10/12/2013,  repeated every 12 weeks, biopsy of a scalp lesion 10/17/2013 showed metastatic breast cancer, HER-2/neu negative, biopsy of a right anterior chest wall nodule August 2017 shows adenocarcinoma, anastrozole started 10/28/2013, changed to letrozole 06/27/2016, new skin lesion removed from right anterior chest wall August 2017, status post radiation to the anterior lower right chest wall completed 02/25/2017 (20 sessions) PET scan 02/03/2017 shows her bone metastases, and in addition a 0.5 cm right middle lobe nodule which may be new, MRI of the cervical spine shows collapse of the left lateral mass of C3.  There is no soft tissue mass or focal neural impingement. palbociclib reduced to 75 mg as of September 2018, palbociclib held during cervical spine irradiation finished in mid February, 201l, letrozole discontinued February 2019 secondary to likely progression, neoplasia associated pain, painful blastic lesion left femoral intertrochanteric region: Status post radiation completed November 2015, pain left knee, status post TKR  remotely, left total hip replacement 02/2016, palliative radiation to the cervical spine completed 05/08/2017 in [place], PET scan 01/03/2020 finds no evidence of disease activity    Patient Stated Goals to get rid of the pain under the breast    Currently in Pain? Yes    Pain Score 3     Pain Location Scapula    Pain Orientation Right;Left    Pain Descriptors / Indicators Aching    Pain Type  Acute pain    Pain Onset Today    Pain Frequency Intermittent    Aggravating Factors  nothing    Pain Relieving Factors nothing    Effect of Pain on Daily Activities none                             [facility] Adult PT Treatment/Exercise - 05/14/20 0001      Manual Therapy   Soft tissue mobilization in right sidelying to left IT band using cocoa butter from along entire portion to decrease pain and tightness with emphasis placed at greater trochanter and surrounding musculature where pt has increased sensitivity - less muscle tightness palpable today than last session; also to bilateral rhomboids and upper traps with focus on L upper trap/levator where pt was having discomfort today with improvement noted by end of session                       PT Long Term Goals - 04/26/20 1409      PT LONG TERM GOAL #1   Title Pt will report 75% improvement in pain in inferior right chest to allow improved comfort.    Baseline 04/26/20- 90% improvement    Time 4    Period Weeks    Status Achieved      PT LONG TERM GOAL #2   Title Pt will demonstrate 150 degrees of right shoulder abduction to allow her to reach out to the sides without limitation by tight pec muscle.    Baseline 138; 04/26/20- 165 degrees    Time 4    Period Weeks    Status Achieved      PT LONG TERM GOAL #3   Title Pt will be independent in a home exercise program for continued strengthening and stretching.    Time 4    Period Weeks    Status On-going      PT LONG TERM GOAL #4   Title Pt will demonstrate improved right scapular positioning for optimal R shoulder function.    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    Time 4    Period Weeks    Status New    Target Date 05/24/20                 Plan  - 05/14/20 1103    Clinical Impression Statement Pt is making excellent progress towards her goals in therapy. She is no longer having pain and tightness across her chest. Her hip pain has improved greatly and pt did not feel any pain getting in or out of the car since last session. She reports no pain in her hip since last  session. She did have some pain in her upper shoulders so addressed that with soft tissue moblization today. Plan will be to assess pain at next session and possibly discharge if pt's pain is still controlled.    PT Frequency 2x / week    PT Duration 4 weeks    PT Treatment/Interventions ADLs/Self Care Home Management;Therapeutic exercise;Therapeutic activities;Patient/family education;Manual techniques;Scar mobilization;Passive range of motion;Taping    PT Next Visit Plan cont STM to  ITB, assess indep with new exercises, continue gentle strengthening of R scap - no resistance due to bone mets, STM to inferior R chest and R rhomboids, stretching R pec    PT Home Exercise Plan scapular squeezes, supine scap series with no resisitance, pelvic tilts, meeks decompression;Access Code: [email or web address]    Consulted and Agree with Plan of Care Patient           Patient will benefit from skilled therapeutic intervention in order to improve the following deficits and impairments:  Pain,Postural dysfunction,Increased fascial restricitons,Decreased strength,Decreased range of motion,Decreased scar mobility  Visit Diagnosis: Pain in left leg  Abnormal posture     Problem List Patient Active Problem List   Diagnosis Date Noted  . Aortic atherosclerosis ([place]) 08/24/2017  . Malignant neoplasm of overlapping sites of right breast in female, estrogen receptor positive ([place]) 04/21/2016  . Goals of care, counseling/discussion 04/21/2016  . OA (osteoarthritis) of hip 03/12/2016  . Pain from bone metastases ([place]) 10/02/2015  . Metastatic breast cancer ([place]) 07/12/2015  . Breast cancer  metastasized to bone ([place]) 10/12/2013  . TIA (transient ischemic attack)     Allyson Sabal [facility] 05/14/2020, 11:05 AM  [place], [place], [address] Phone: [phone]   Fax:  [phone]  Name: Jade Mooney MRN: [id number] Date of Birth: Jun 27, 1945  Manus Gunning, PT 05/14/20 11:05 AM

## 2020-05-17 ENCOUNTER — Other Ambulatory Visit: Payer: Self-pay

## 2020-05-17 ENCOUNTER — Ambulatory Visit: Payer: Medicare Other | Admitting: Physical Therapy

## 2020-05-17 ENCOUNTER — Encounter: Payer: Self-pay | Admitting: Physical Therapy

## 2020-05-17 DIAGNOSIS — M6281 Muscle weakness (generalized): Secondary | ICD-10-CM

## 2020-05-17 DIAGNOSIS — L599 Disorder of the skin and subcutaneous tissue related to radiation, unspecified: Secondary | ICD-10-CM | POA: Diagnosis not present

## 2020-05-17 DIAGNOSIS — E782 Mixed hyperlipidemia: Secondary | ICD-10-CM | POA: Diagnosis not present

## 2020-05-17 DIAGNOSIS — M79605 Pain in left leg: Secondary | ICD-10-CM

## 2020-05-17 DIAGNOSIS — M25511 Pain in right shoulder: Secondary | ICD-10-CM | POA: Diagnosis not present

## 2020-05-17 DIAGNOSIS — R293 Abnormal posture: Secondary | ICD-10-CM

## 2020-05-17 DIAGNOSIS — E039 Hypothyroidism, unspecified: Secondary | ICD-10-CM | POA: Diagnosis not present

## 2020-05-17 DIAGNOSIS — M25611 Stiffness of right shoulder, not elsewhere classified: Secondary | ICD-10-CM | POA: Diagnosis not present

## 2020-05-17 DIAGNOSIS — I10 Essential (primary) hypertension: Secondary | ICD-10-CM | POA: Diagnosis not present

## 2020-05-17 DIAGNOSIS — C50911 Malignant neoplasm of unspecified site of right female breast: Secondary | ICD-10-CM | POA: Diagnosis not present

## 2020-05-17 NOTE — Patient Instructions (Signed)
Access Code: [email or web address] URL: [email or web address] Date: 05/17/2020 Prepared by: Manus Gunning  Exercises ITB Stretch at [employer] - 1 x daily - 7 x weekly - 1 sets - 3 reps - 30-60 sec hold Supine ITB Stretch with Strap - 1 x daily - 7 x weekly - 1 sets - 3 reps - 30-60 sec hold Sidelying Hip Abduction - 1 x daily - 7 x weekly - 1 sets - 10 reps Clam - 1 x daily - 7 x weekly - 1 sets - 10 reps Supine Figure 4 Piriformis Stretch - 1 x daily - 7 x weekly - 1 sets - 3 reps - 15-30 sec hold Doorway Pec Stretch at 90 Degrees Abduction - 1 x daily - 7 x weekly - 1 sets - 3 reps - 30-60 sec hold Standing Hip Flexion with Counter Support - 1 x daily - 7 x weekly - 1 sets - 10 reps Standing Hip Abduction with Counter Support - 1 x daily - 7 x weekly - 1 sets - 10 reps Standing Hip Extension with Counter Support - 1 x daily - 7 x weekly - 1 sets - 10 reps Mini Squat with Chair - 1 x daily - 7 x weekly - 1 sets - 10 reps Sit to Stand Without Arm Support - 1 x daily - 7 x weekly - 1 sets - 10 reps

## 2020-05-17 NOTE — Therapy (Signed)
[place], [place], [address] Phone: [phone]   Fax:  [phone]  Physical Therapy Treatment  Patient Details  Name: Jade Mooney MRN: [id number] Date of Birth: September 06, 1945 Referring Provider (PT): Causey   Encounter Date: 05/17/2020   PT End of Session - 05/17/20 1100    Visit Number 14    Number of Visits 17    Date for PT Re-Evaluation 05/24/20    PT Start Time 1006    PT Stop Time 1058    PT Time Calculation (min) 52 min    Activity Tolerance Patient tolerated treatment well    Behavior During Therapy [facility] for tasks assessed/performed           Past Medical History:  Diagnosis Date  . Anxiety   . Cancer [facility])    Breast 1997 right tx with mastectomy and chemo, metastatic now  . GERD (gastroesophageal reflux disease)   . Hypercholesterolemia   . Hypertension   . Hypothyroidism   . Osteoarthritis    oa  . Personal history of chemotherapy   . Personal history of radiation therapy   . Secondary malignant neoplasm of bone and bone marrow ([place])   . TIA (transient ischemic attack) last 11-08-15   x 2 total    Past Surgical History:  Procedure Laterality Date  . CATARACT EXTRACTION, BILATERAL    . MASTECTOMY MODIFIED RADICAL Right 1997  . porta cath insertion  1997   later removed  . radiation tx  finished 02-25-16   x 20 tx  . TONSILLECTOMY    . TOTAL HIP ARTHROPLASTY Left 03/12/2016   Procedure: LEFT TOTAL HIP ARTHROPLASTY ANTERIOR APPROACH;  Surgeon: Gaynelle Arabian, MD;  Location: [facility];  Service: Orthopedics;  Laterality: Left;  . TOTAL KNEE ARTHROPLASTY Left   . TUBAL LIGATION      There were no vitals filed for this visit.   Subjective Assessment - 05/17/20 1008    Subjective The last few days I have felt some pain in my hip in my computer chair. I have not felt it any other time. I don't have any issues getting in the car. My back is fine today.    Pertinent  History s/p Right mastectomy 1997 for an estrogen receptor and HER-2 positive breast cancer, treated with adjuvant chemotherapy according to CALGB 9640 (taxotere x 4 cycles), high-dose chemotherapy (cyclophosphamide, carboplatin, BCNU) followed by stem cell rescue at [facility] 12/21/17, biopsy-proven metastatic disease November 2007, estrogen receptor and HER-2 positive, tamoxifen completed 2002, status post right iliac crest biopsy 10/10/2013 for invasive ductal carcinoma, grade 2, estrogen and progesterone receptor positive, zoledronate started 10/12/2013,  repeated every 12 weeks, biopsy of a scalp lesion 10/17/2013 showed metastatic breast cancer, HER-2/neu negative, biopsy of a right anterior chest wall nodule August 2017 shows adenocarcinoma, anastrozole started 10/28/2013, changed to letrozole 06/27/2016, new skin lesion removed from right anterior chest wall August 2017, status post radiation to the anterior lower right chest wall completed 02/25/2017 (20 sessions) PET scan 02/03/2017 shows her bone metastases, and in addition a 0.5 cm right middle lobe nodule which may be new, MRI of the cervical spine shows collapse of the left lateral mass of C3.  There is no soft tissue mass or focal neural impingement. palbociclib reduced to 75 mg as of September 2018, palbociclib held during cervical spine irradiation finished in mid February, 201l, letrozole discontinued February 2019 secondary to likely progression, neoplasia associated pain, painful blastic lesion left femoral intertrochanteric  region: Status post radiation completed November 2015, pain left knee, status post TKR remotely, left total hip replacement 02/2016, palliative radiation to the cervical spine completed 05/08/2017 in [place], PET scan 01/03/2020 finds no evidence of disease activity    Patient Stated Goals to get rid of the pain under the breast    Currently in Pain? No/denies    Pain Score 0-No pain                              [facility] Adult PT Treatment/Exercise - 05/17/20 0001      Exercises   Exercises Other Exercises    Other Exercises  Instructed pt in 3 way hip exercises with pt educated to use minimal support at counter top in direction of flexion ,abduction and extension with v/c for slow controlled movement and knee straight, mini squats in front of mat table with v/c for correct form, standing doorway stretch for pecs x 30 sec holds x2, sit to stands from chair without using hands- added all to pt's HEP      Manual Therapy   Soft tissue mobilization in right sidelying to muscles surrounding greater trochanter where pt had some discomfort in the past few days                       PT Long Term Goals - 05/17/20 1010      PT LONG TERM GOAL #1   Title Pt will report 75% improvement in pain in inferior right chest to allow improved comfort.    Baseline 04/26/20- 90% improvement    Time 4    Period Weeks    Status Achieved      PT LONG TERM GOAL #2   Title Pt will demonstrate 150 degrees of right shoulder abduction to allow her to reach out to the sides without limitation by tight pec muscle.    Baseline 138; 04/26/20- 165 degrees    Time 4    Period Weeks    Status Achieved      PT LONG TERM GOAL #3   Title Pt will be independent in a home exercise program for continued strengthening and stretching.    Time 4    Period Weeks    Status Achieved      PT LONG TERM GOAL #4   Title Pt will demonstrate improved right scapular positioning for optimal R shoulder function.    Baseline scapula is abducted and depressed; 05/17/20- pt has improved upright posture and is able to self correct    Time 4    Period Weeks    Status Achieved      PT LONG TERM GOAL #5   Title Pt will report 75% improvement in L LE pain to allow pt to get in and out of the car with less discomfort.    Baseline 05/17/20- 80% improvement    Time 4    Period Weeks    Status Achieved                  Plan - 05/17/20 1013    Clinical Impression Statement Assessed pt's progress towards goals in therapy. Pt has now met all goals for therapy. She is still not having any pain getting in or out of her car. She was having some discomfort over the last couple of days in her left hip when sitting in her computer chair but she feels this is  because she was sitting too close to the edge. Upgraded pt's home exercise program futher to include additional strengthening and stretching exercises. Pt no longer has pain and tightness across her chest. She is now ready for discharge from skilled PT services.    PT Frequency 2x / week    PT Duration 4 weeks    PT Treatment/Interventions ADLs/Self Care Home Management;Therapeutic exercise;Therapeutic activities;Patient/family education;Manual techniques;Scar mobilization;Passive range of motion;Taping    PT Next Visit Plan d/c this visit    PT Home Exercise Plan scapular squeezes, supine scap series with no resisitance, pelvic tilts, meeks decompression;Access Code: [email or web address]    Consulted and Agree with Plan of Care Patient           Patient will benefit from skilled therapeutic intervention in order to improve the following deficits and impairments:  Pain,Postural dysfunction,Increased fascial restricitons,Decreased strength,Decreased range of motion,Decreased scar mobility  Visit Diagnosis: Pain in left leg  Abnormal posture  Muscle weakness (generalized)     Problem List Patient Active Problem List   Diagnosis Date Noted  . Aortic atherosclerosis ([place]) 08/24/2017  . Malignant neoplasm of overlapping sites of right breast in female, estrogen receptor positive ([place]) 04/21/2016  . Goals of care, counseling/discussion 04/21/2016  . OA (osteoarthritis) of hip 03/12/2016  . Pain from bone metastases ([place]) 10/02/2015  . Metastatic breast cancer ([place]) 07/12/2015  . Breast cancer metastasized to bone ([place]) 10/12/2013  . TIA  (transient ischemic attack)     Allyson Sabal [facility] 05/17/2020, 12:03 PM  [place], [place], [address] Phone: [phone]   Fax:  [phone]  Name: Renada Cronin MRN: [id number] Date of Birth: 1945-09-13  PHYSICAL THERAPY DISCHARGE SUMMARY  Visits from Start of Care: 14  Current functional level related to goals / functional outcomes: All goals met   Remaining deficits: None   Education / Equipment: HEP  Plan: Patient agrees to discharge.  Patient goals were met. Patient is being discharged due to meeting the stated rehab goals.  ?????    Allyson Sabal [place], [place] 05/17/20 12:03 PM

## 2020-05-21 DIAGNOSIS — K219 Gastro-esophageal reflux disease without esophagitis: Secondary | ICD-10-CM | POA: Diagnosis not present

## 2020-05-21 DIAGNOSIS — E785 Hyperlipidemia, unspecified: Secondary | ICD-10-CM | POA: Diagnosis not present

## 2020-05-21 DIAGNOSIS — E039 Hypothyroidism, unspecified: Secondary | ICD-10-CM | POA: Diagnosis not present

## 2020-05-21 DIAGNOSIS — I10 Essential (primary) hypertension: Secondary | ICD-10-CM | POA: Diagnosis not present

## 2020-05-22 ENCOUNTER — Inpatient Hospital Stay: Payer: Medicare Other | Attending: Oncology

## 2020-05-22 ENCOUNTER — Other Ambulatory Visit: Payer: Self-pay

## 2020-05-22 ENCOUNTER — Other Ambulatory Visit: Payer: Self-pay | Admitting: Hematology and Oncology

## 2020-05-22 DIAGNOSIS — C7951 Secondary malignant neoplasm of bone: Secondary | ICD-10-CM | POA: Insufficient documentation

## 2020-05-22 DIAGNOSIS — C50911 Malignant neoplasm of unspecified site of right female breast: Secondary | ICD-10-CM

## 2020-05-22 DIAGNOSIS — C50919 Malignant neoplasm of unspecified site of unspecified female breast: Secondary | ICD-10-CM

## 2020-05-22 DIAGNOSIS — Z79899 Other long term (current) drug therapy: Secondary | ICD-10-CM | POA: Insufficient documentation

## 2020-05-22 DIAGNOSIS — C50811 Malignant neoplasm of overlapping sites of right female breast: Secondary | ICD-10-CM

## 2020-05-22 DIAGNOSIS — Z5111 Encounter for antineoplastic chemotherapy: Secondary | ICD-10-CM | POA: Diagnosis not present

## 2020-05-22 DIAGNOSIS — R978 Other abnormal tumor markers: Secondary | ICD-10-CM | POA: Insufficient documentation

## 2020-05-22 DIAGNOSIS — C44501 Unspecified malignant neoplasm of skin of breast: Secondary | ICD-10-CM | POA: Insufficient documentation

## 2020-05-22 DIAGNOSIS — C7989 Secondary malignant neoplasm of other specified sites: Secondary | ICD-10-CM | POA: Insufficient documentation

## 2020-05-22 DIAGNOSIS — Z170 Estrogen receptor positive status [ER+]: Secondary | ICD-10-CM

## 2020-05-22 DIAGNOSIS — D649 Anemia, unspecified: Secondary | ICD-10-CM | POA: Diagnosis not present

## 2020-05-22 LAB — CBC AND DIFFERENTIAL
HCT: 39 (ref 36–46)
Hemoglobin: 13 (ref 12.0–16.0)
Neutrophils Absolute: 1.4
Platelets: 223 (ref 150–399)
WBC: 2.5

## 2020-05-22 LAB — BASIC METABOLIC PANEL
BUN: 16 (ref 4–21)
CO2: 25 — AB (ref 13–22)
Chloride: 103 (ref 99–108)
Creatinine: 0.8 (ref 0.5–1.1)
Glucose: 144
Potassium: 4 (ref 3.4–5.3)
Sodium: 135 — AB (ref 137–147)

## 2020-05-22 LAB — COMPREHENSIVE METABOLIC PANEL
Albumin: 4.5 (ref 3.5–5.0)
Calcium: 9 (ref 8.7–10.7)

## 2020-05-22 LAB — HEPATIC FUNCTION PANEL
ALT: 18 (ref 7–35)
AST: 29 (ref 13–35)
Alkaline Phosphatase: 58 (ref 25–125)
Bilirubin, Total: 0.6

## 2020-05-22 LAB — CBC: RBC: 4.24 (ref 3.87–5.11)

## 2020-05-23 LAB — CANCER ANTIGEN 27.29: CA 27.29: 28.5 U/mL (ref 0.0–38.6)

## 2020-05-24 ENCOUNTER — Inpatient Hospital Stay: Payer: Medicare Other

## 2020-05-24 ENCOUNTER — Other Ambulatory Visit: Payer: Self-pay

## 2020-05-24 VITALS — BP 148/78 | HR 61 | Temp 98.1°F | Resp 18 | Ht 64.0 in | Wt 172.5 lb

## 2020-05-24 DIAGNOSIS — C7951 Secondary malignant neoplasm of bone: Secondary | ICD-10-CM | POA: Diagnosis not present

## 2020-05-24 DIAGNOSIS — Z5111 Encounter for antineoplastic chemotherapy: Secondary | ICD-10-CM | POA: Diagnosis not present

## 2020-05-24 DIAGNOSIS — C7989 Secondary malignant neoplasm of other specified sites: Secondary | ICD-10-CM | POA: Diagnosis not present

## 2020-05-24 DIAGNOSIS — Z79899 Other long term (current) drug therapy: Secondary | ICD-10-CM | POA: Diagnosis not present

## 2020-05-24 DIAGNOSIS — C50919 Malignant neoplasm of unspecified site of unspecified female breast: Secondary | ICD-10-CM

## 2020-05-24 DIAGNOSIS — R978 Other abnormal tumor markers: Secondary | ICD-10-CM | POA: Diagnosis not present

## 2020-05-24 DIAGNOSIS — C44501 Unspecified malignant neoplasm of skin of breast: Secondary | ICD-10-CM | POA: Diagnosis not present

## 2020-05-24 MED ORDER — FULVESTRANT 250 MG/5ML IM SOLN
500.0000 mg | Freq: Once | INTRAMUSCULAR | Status: AC
  Administered 2020-05-24: 500 mg via INTRAMUSCULAR

## 2020-05-24 MED ORDER — FULVESTRANT 250 MG/5ML IM SOLN
INTRAMUSCULAR | Status: AC
  Filled 2020-05-24: qty 10

## 2020-05-24 MED ORDER — DENOSUMAB 120 MG/1.7ML ~~LOC~~ SOLN
SUBCUTANEOUS | Status: AC
  Filled 2020-05-24: qty 1.7

## 2020-05-24 MED ORDER — DENOSUMAB 120 MG/1.7ML ~~LOC~~ SOLN
120.0000 mg | Freq: Once | SUBCUTANEOUS | Status: AC
  Administered 2020-05-24: 120 mg via SUBCUTANEOUS

## 2020-05-24 NOTE — Patient Instructions (Signed)
Denosumab injection What is this medicine? DENOSUMAB (den oh sue mab) slows bone breakdown. Prolia is used to treat osteoporosis in women after menopause and in men, and in people who are taking corticosteroids for 6 months or more. Xgeva is used to treat a high calcium level due to cancer and to prevent bone fractures and other bone problems caused by multiple myeloma or cancer bone metastases. Xgeva is also used to treat giant cell tumor of the bone. This medicine may be used for other purposes; ask your health care provider or pharmacist if you have questions. COMMON BRAND NAME(S): Prolia, XGEVA What should I tell my health care provider before I take this medicine? They need to know if you have any of these conditions:  dental disease  having surgery or tooth extraction  infection  kidney disease  low levels of calcium or Vitamin D in the blood  malnutrition  on hemodialysis  skin conditions or sensitivity  thyroid or parathyroid disease  an unusual reaction to denosumab, other medicines, foods, dyes, or preservatives  pregnant or trying to get pregnant  breast-feeding How should I use this medicine? This medicine is for injection under the skin. It is given by a health care professional in a hospital or clinic setting. A special MedGuide will be given to you before each treatment. Be sure to read this information carefully each time. For Prolia, talk to your pediatrician regarding the use of this medicine in children. Special care may be needed. For Xgeva, talk to your pediatrician regarding the use of this medicine in children. While this drug may be prescribed for children as young as 13 years for selected conditions, precautions do apply. Overdosage: If you think you have taken too much of this medicine contact a poison control center or emergency room at once. NOTE: This medicine is only for you. Do not share this medicine with others. What if I miss a dose? It is  important not to miss your dose. Call your doctor or health care professional if you are unable to keep an appointment. What may interact with this medicine? Do not take this medicine with any of the following medications:  other medicines containing denosumab This medicine may also interact with the following medications:  medicines that lower your chance of fighting infection  steroid medicines like prednisone or cortisone This list may not describe all possible interactions. Give your health care provider a list of all the medicines, herbs, non-prescription drugs, or dietary supplements you use. Also tell them if you smoke, drink alcohol, or use illegal drugs. Some items may interact with your medicine. What should I watch for while using this medicine? Visit your doctor or health care professional for regular checks on your progress. Your doctor or health care professional may order blood tests and other tests to see how you are doing. Call your doctor or health care professional for advice if you get a fever, chills or sore throat, or other symptoms of a cold or flu. Do not treat yourself. This drug may decrease your body's ability to fight infection. Try to avoid being around people who are sick. You should make sure you get enough calcium and vitamin D while you are taking this medicine, unless your doctor tells you not to. Discuss the foods you eat and the vitamins you take with your health care professional. See your dentist regularly. Brush and floss your teeth as directed. Before you have any dental work done, tell your dentist you are   receiving this medicine. Do not become pregnant while taking this medicine or for 5 months after stopping it. Talk with your doctor or health care professional about your birth control options while taking this medicine. Women should inform their doctor if they wish to become pregnant or think they might be pregnant. There is a potential for serious side  effects to an unborn child. Talk to your health care professional or pharmacist for more information. What side effects may I notice from receiving this medicine? Side effects that you should report to your doctor or health care professional as soon as possible:  allergic reactions like skin rash, itching or hives, swelling of the face, lips, or tongue  bone pain  breathing problems  dizziness  jaw pain, especially after dental work  redness, blistering, peeling of the skin  signs and symptoms of infection like fever or chills; cough; sore throat; pain or trouble passing urine  signs of low calcium like fast heartbeat, muscle cramps or muscle pain; pain, tingling, numbness in the hands or feet; seizures  unusual bleeding or bruising  unusually weak or tired Side effects that usually do not require medical attention (report to your doctor or health care professional if they continue or are bothersome):  constipation  diarrhea  headache  joint pain  loss of appetite  muscle pain  runny nose  tiredness  upset stomach This list may not describe all possible side effects. Call your doctor for medical advice about side effects. You may report side effects to FDA at 1-800-FDA-1088. Where should I keep my medicine? This medicine is only given in a clinic, doctor's office, or other health care setting and will not be stored at home. NOTE: This sheet is a summary. It may not cover all possible information. If you have questions about this medicine, talk to your doctor, pharmacist, or health care provider.  2021 Elsevier/Gold Standard (2017-07-17 16:10:44) Fulvestrant injection What is this medicine? FULVESTRANT (ful VES trant) blocks the effects of estrogen. It is used to treat breast cancer. This medicine may be used for other purposes; ask your health care provider or pharmacist if you have questions. COMMON BRAND NAME(S): FASLODEX What should I tell my health care  provider before I take this medicine? They need to know if you have any of these conditions:  bleeding disorders  liver disease  low blood counts, like low white cell, platelet, or red cell counts  an unusual or allergic reaction to fulvestrant, other medicines, foods, dyes, or preservatives  pregnant or trying to get pregnant  breast-feeding How should I use this medicine? This medicine is for injection into a muscle. It is usually given by a health care professional in a hospital or clinic setting. Talk to your pediatrician regarding the use of this medicine in children. Special care may be needed. Overdosage: If you think you have taken too much of this medicine contact a poison control center or emergency room at once. NOTE: This medicine is only for you. Do not share this medicine with others. What if I miss a dose? It is important not to miss your dose. Call your doctor or health care professional if you are unable to keep an appointment. What may interact with this medicine?  medicines that treat or prevent blood clots like warfarin, enoxaparin, dalteparin, apixaban, dabigatran, and rivaroxaban This list may not describe all possible interactions. Give your health care provider a list of all the medicines, herbs, non-prescription drugs, or dietary supplements you use.   Also tell them if you smoke, drink alcohol, or use illegal drugs. Some items may interact with your medicine. What should I watch for while using this medicine? Your condition will be monitored carefully while you are receiving this medicine. You will need important blood work done while you are taking this medicine. Do not become pregnant while taking this medicine or for at least 1 year after stopping it. Women of child-bearing potential will need to have a negative pregnancy test before starting this medicine. Women should inform their doctor if they wish to become pregnant or think they might be pregnant. There is  a potential for serious side effects to an unborn child. Men should inform their doctors if they wish to father a child. This medicine may lower sperm counts. Talk to your health care professional or pharmacist for more information. Do not breast-feed an infant while taking this medicine or for 1 year after the last dose. What side effects may I notice from receiving this medicine? Side effects that you should report to your doctor or health care professional as soon as possible:  allergic reactions like skin rash, itching or hives, swelling of the face, lips, or tongue  feeling faint or lightheaded, falls  pain, tingling, numbness, or weakness in the legs  signs and symptoms of infection like fever or chills; cough; flu-like symptoms; sore throat  vaginal bleeding Side effects that usually do not require medical attention (report to your doctor or health care professional if they continue or are bothersome):  aches, pains  constipation  diarrhea  headache  hot flashes  nausea, vomiting  pain at site where injected  stomach pain This list may not describe all possible side effects. Call your doctor for medical advice about side effects. You may report side effects to FDA at 1-800-FDA-1088. Where should I keep my medicine? This drug is given in a hospital or clinic and will not be stored at home. NOTE: This sheet is a summary. It may not cover all possible information. If you have questions about this medicine, talk to your doctor, pharmacist, or health care provider.  2021 Elsevier/Gold Standard (2017-06-18 11:34:41)

## 2020-06-06 DIAGNOSIS — E538 Deficiency of other specified B group vitamins: Secondary | ICD-10-CM | POA: Diagnosis not present

## 2020-06-16 ENCOUNTER — Other Ambulatory Visit: Payer: Self-pay | Admitting: Oncology

## 2020-06-20 NOTE — Progress Notes (Signed)
[facility]  [phone] [phone]) [phone]   ID: Jade Mooney DOB: [birth year]  MR#: [id number]  NMM#:[id number]  Patient Care Team: Street, Sharon Mt, MD as PCP - General (Family Medicine) Clarene Essex, MD as Consulting Physician (Gastroenterology) Skyleigh Windle, Virgie Dad, MD as Consulting Physician (Oncology) Gaynelle Arabian, MD as Consulting Physician (Orthopedic Surgery) Gatha Mayer, MD as Consulting Physician (Radiation Oncology) Kristeen Miss, MD as Consulting Physician (Neurosurgery) Derwood Kaplan, MD as Consulting Physician (Oncology)   CHIEF COMPLAINT: metastatic breast cancer (s/p right mastectomy)  CURRENT TREATMENT: fulvestrant, denosumab/Xgeva, palbociclib   INTERVAL HISTORY: Jade Mooney returns today for follow-up and treatment of her estrogen receptor positive breast cancer.  Her most recent restaging PET scan was completed on 01/03/2020 showing: no abnormal FDG uptake identified to suggest residual or recurrent metabolically active disease; widespread sclerotic bone masses without corresponding hypermetabolism, favored to represent treated bone metastases.  She continues on palbociclib/Ibrance at 75 mg every other day, 21 days on 7 days off.  She tolerates this with no side effects that she is aware of.  Her [place] today remains favorable  She also continues on fulvestrant, which she receives today and every 28 days.  Aside from the discomfort of the administration (which can last up to a week).  She toelrates this well.    Finally, she also continues on denosumab/Xgeva.  She receives that also every 28 days and again 2 out of every 3 doses she receives in [place] and every third dose including today's she receives it here.  There have been no dental issues to date.  Since her last visit, she underwent left screening mammography with tomography at [place] on 04/30/2020 showing: breast density category C; no evidence of  malignancy.   We are following the CA 27-29: Results for Jade Mooney, Jade Mooney (MRN [id number]) as of 06/21/2020 10:54  Ref. Range 01/05/2020 09:19 02/01/2020 09:49 02/28/2020 11:41 03/29/2020 09:33 05/22/2020 10:07  CA 27.29 Latest Ref Range: 0.0 - 38.6 U/mL 30.1 32.6 25.1 30.8 28.5    REVIEW OF SYSTEMS: Jade Mooney continues to do remarkably well.  She is very active in her church and just supervised the production of 380 chicken pies which is a self.  She and [name] like to take walks although they do not do this regularly.  She says he is having some back issues.  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  She tells me she is scheduled for a total body skin cancer screening in the next month.  She is having some issues with her left knee.  She is having spasms in the right chest wall but physical therapy was very helpful and she is continuing to do those exercises.  She has a little bit of tingling around the left neck area which is the way the problem originally started which led to her need for neck surgery.  The problem is not bad enough for her to go back to neurosurgery she says but she has been alerted that she might need to do that.  Detailed review of systems today was otherwise stable.   COVID 19 VACCINATION STATUS:    BREAST CANCER HISTORY: From the earlier summary notes:  Jade Mooney was referred to Dr. Clarene Essex for evaluation of abdominal discomfort and nausea. Exam and blood work including liver function tests was unremarkable aside from normochromic normocytic anemia. Cholecystitis was suspected and the patient  underwent endoscopy followed by abdominal/ pelvic CT scan on 09/29/2013. This showed showed a normal gallbladder. Incidental findings included multiple simple cysts in the liver and aortic atherosclerosis. However mixed lytic and sclerotic bony lesions were noted. On 10/10/2013 the patient underwent biopsy  of the right iliac bone, and this showed ([id number]) metastatic invasive ductal carcinoma, grade 2, estrogen and progesterone receptor strongly positive.  Dorothee has a remote history of breast cancer, which we tried to reconstruct. She was originally diagnosed early in 1997 with stage III disease, and underwent right mastectomy followed by adjuvant chemotherapy. She then participated in a [facility] protocol with high-dose chemotherapy followed by stem cell rescue 12/22/1995. Unfortunately later that year liver lesions were noted and were biopsy-proven to be metastatic breast cancer. This was not felt to be resectable. However the tumor was estrogen receptor positive and HER-2 positive. The patient was treated with Herceptin (she does not know for what period of time) and was then on tamoxifen until November of 2002. She also participated in a vaccine study at [facility].  Feige's subsequent history is as detailed below   PAST MEDICAL HISTORY: Past Medical History:  Diagnosis Date  . Anxiety   . Cancer [facility])    Breast 1997 right tx with mastectomy and chemo, metastatic now  . GERD (gastroesophageal reflux disease)   . Hypercholesterolemia   . Hypertension   . Hypothyroidism   . Osteoarthritis    oa  . Personal history of chemotherapy   . Personal history of radiation therapy   . Secondary malignant neoplasm of bone and bone marrow ([place])   . TIA (transient ischemic attack) last 11-08-15   x 2 total    PAST SURGICAL HISTORY: Past Surgical History:  Procedure Laterality Date  . CATARACT EXTRACTION, BILATERAL    . MASTECTOMY MODIFIED RADICAL Right 1997  . porta cath insertion  1997   later removed  . radiation tx  finished 02-25-16   x 20 tx  . TONSILLECTOMY    . TOTAL HIP ARTHROPLASTY Left 03/12/2016   Procedure: LEFT TOTAL HIP ARTHROPLASTY ANTERIOR APPROACH;  Surgeon: Gaynelle Arabian, MD;  Location: [facility];  Service: Orthopedics;  Laterality: Left;  . TOTAL KNEE ARTHROPLASTY Left   . TUBAL  LIGATION      FAMILY HISTORY: Family History  Problem Relation Age of Onset  . Breast cancer Maternal Aunt   The patient's father died at the age of 79 from heart disease. The patient's mother died at the age of 40. The patient had no brothers, one sister. One of the patient's mother's 5 sisters was diagnosed with breast cancer in her 39s   GYNECOLOGIC HISTORY:  No LMP recorded. Patient is postmenopausal. Menarche age 14, the patient is GX P0. She stopped having periods approximately 1994. She used hormone replacement until 1997. 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 Her husband [name] (goes by [name]") was [employer]. of information all services at [employer]. They live alone, with no pets.   ADVANCED DIRECTIVES: In place   HEALTH MAINTENANCE: Social History   Tobacco Use  . Smoking status: Never Smoker  . Smokeless tobacco: Never Used  Substance Use Topics  . Alcohol use: Yes    Comment: 1 glass wine 4-5 x week  . Drug use: No    Colonoscopy: 2013  PAP: 2010  Bone density: 2012  Lipid panel:  Allergies  Allergen Reactions  . Acetaminophen   . Amoxicillin Hives    Has patient had a PCN reaction causing immediate rash, facial/tongue/throat swelling, SOB or lightheadedness with hypotension:unsure Has patient had a PCN reaction causing severe rash involving mucus membranes or skin necrosis:No Has patient had a PCN reaction that required hospitalization:No Has patient had a PCN reaction occurring within the last 10 years: Yes If all of the above answers are "NO", then may proceed with Cephalosporin use.   . Aspirin   . Erythromycin Other (See Comments)  . Oxycodone   . Oxycodone Hcl   . Percocet [Oxycodone-Acetaminophen] Nausea And Vomiting  . Percodan [Oxycodone-Aspirin] Nausea And Vomiting  . Tramadol Itching    Current Outpatient Medications  Medication  Sig Dispense Refill  . acetaminophen (TYLENOL) 500 MG tablet Take 1,000 mg by mouth every 6 (six) hours as needed for mild pain. Reported on 04/17/2015    . ascorbic acid (VITAMIN C) 1000 MG tablet Take 1,000 mg by mouth daily.    . beta carotene 10000 UNIT capsule Take 10,000 Units by mouth daily.    . calcium carbonate (OSCAL) 1500 (600 Ca) MG TABS tablet Take 600 mg of elemental calcium by mouth 2 (two) times daily with a meal.    . calcium citrate-vitamin D (CITRACAL+D) 315-200 MG-UNIT tablet Take 315 tablets by mouth in the morning and at bedtime.    . calcium gluconate 500 MG tablet Take 1 tablet by mouth 3 (three) times daily.    . cholecalciferol (VITAMIN D) 1000 units tablet Take 1,000 Units by mouth daily.    . clopidogrel (PLAVIX) 75 MG tablet Take 1 tablet (75 mg total) by mouth daily.    . cyanocobalamin (,VITAMIN B-12,) 1000 MCG/ML injection Inject 1,000 mcg into the muscle once.    . Denosumab (XGEVA [place]) Inject into the skin.    . famotidine (PEPCID) 40 MG tablet Take 1 tablet (40 mg total) by mouth 2 (two) times daily.    . Fulvestrant (FASLODEX IM) Inject 500 mg into the muscle.    Marland Kitchen glucosamine-chondroitin 500-400 MG tablet Take 1 tablet by mouth 3 (three) times daily.    Marland Kitchen ketotifen (ZADITOR) 0.025 % ophthalmic solution 1 drop 2 (two) times daily.    . Lactobacillus (LACTINEX) PACK Take 100 Million Cells by mouth daily.    Marland Kitchen levothyroxine (SYNTHROID, LEVOTHROID) 88 MCG tablet Take 88 mcg by mouth daily before breakfast.    . Lutein 6 MG TABS Take 6 mg by mouth 2 (two) times daily.     . meclizine (ANTIVERT) 25 MG tablet Take 25 mg by mouth 3 (three) times daily as needed for dizziness.    . metroNIDAZOLE (METROCREAM) 0.75 % cream Apply 1 application topically 2 (two) times daily.    Marland Kitchen METRONIDAZOLE, TOPICAL, 0.75 % LOTN Apply 1 application topically at bedtime. Apply to face for rosacea    . Multiple Vitamins-Minerals (MULTIVITAMIN WITH MINERALS) tablet Take 1 tablet by mouth  daily.    . Omega-3 1000 MG CAPS Take 300-1,000 mg by mouth in the morning and at bedtime.    . palbociclib (IBRANCE) 75 MG capsule Take 1 capsule (75 mg total) by mouth daily with breakfast. Take whole with food on days 1-21, every 28 days. 21 capsule 6  . pantoprazole (PROTONIX) 40 MG tablet Take 40 mg by mouth 2 (two) times daily. Before breakfast and before supper    . Polyethyl Glycol-Propyl Glycol 0.4-0.3 % SOLN Place 1-2 drops into both eyes 2 (two)  times daily.    . prednisoLONE acetate (PRED FORTE) 1 % ophthalmic suspension Place 1 drop into both eyes daily.    Marland Kitchen rOPINIRole (REQUIP) 2 MG tablet Take 3-4 mg by mouth at bedtime.    . rosuvastatin (CRESTOR) 20 MG tablet Take 1 tablet (20 mg total) by mouth daily.    Marland Kitchen Specialty Vitamins Products (ELON MATRIX 5000) TABS Take 5,000 mcg by mouth daily.    . vitamin E 180 MG (400 UNITS) capsule Take 180 Units by mouth daily.    Marland Kitchen zinc gluconate 50 MG tablet Take 50 mg by mouth daily.     No current facility-administered medications for this visit.    OBJECTIVE: White Mooney who appears stated age 75:   06/21/20 1054  BP: (!) 157/76  Pulse: 60  Resp: 18  Temp: 98 F (36.7 C)  SpO2: 99%     Body mass index is 29.66 kg/m.    ECOG FS:1 - Symptomatic but completely ambulatory  Sclerae unicteric, EOMs intact Wearing a mask No cervical or supraclavicular adenopathy Lungs no rales or rhonchi Heart regular rate and rhythm Abd soft, nontender, positive bowel sounds MSK no focal spinal tenderness, no upper extremity lymphedema Neuro: nonfocal, well oriented, appropriate affect Breasts: The right breast is status post mastectomy and radiation.  There are some telangiectasias.  There is no evidence of local recurrence.  The left breast is benign.  Both axillae are benign.   LAB RESULTS:  CMP     Component Value Date/Time   NA 140 06/21/2020 1027   NA 135 (A) 05/22/2020 0000   NA 138 02/09/2017 1213   K 4.2 06/21/2020 1027   K  3.9 02/09/2017 1213   CL 106 06/21/2020 1027   CO2 23 06/21/2020 1027   CO2 27 02/09/2017 1213   GLUCOSE 91 06/21/2020 1027   GLUCOSE 100 02/09/2017 1213   BUN 12 06/21/2020 1027   BUN 16 05/22/2020 0000   BUN 12.0 02/09/2017 1213   CREATININE 0.80 06/21/2020 1027   CREATININE 0.9 02/09/2017 1213   CALCIUM 8.6 (L) 06/21/2020 1027   CALCIUM 8.7 02/09/2017 1213   PROT 6.7 06/21/2020 1027   PROT 6.5 02/09/2017 1213   ALBUMIN 4.2 06/21/2020 1027   ALBUMIN 4.1 02/09/2017 1213   AST 27 06/21/2020 1027   AST 24 02/09/2017 1213   ALT 18 06/21/2020 1027   ALT 18 02/09/2017 1213   ALKPHOS 53 06/21/2020 1027   ALKPHOS 62 02/09/2017 1213   BILITOT 0.7 06/21/2020 1027   BILITOT 0.34 02/09/2017 1213   GFRNONAA >60 06/21/2020 1027   GFRAA >60 10/13/2019 1201   No results found for: SPEP  Lab Results  Component Value Date   WBC 2.6 (L) 06/21/2020   NEUTROABS 1.3 (L) 06/21/2020   HGB 13.2 06/21/2020   HCT 39.6 06/21/2020   MCV 91.2 06/21/2020   PLT 197 06/21/2020      Chemistry      Component Value Date/Time   NA 140 06/21/2020 1027   NA 135 (A) 05/22/2020 0000   NA 138 02/09/2017 1213   K 4.2 06/21/2020 1027   K 3.9 02/09/2017 1213   CL 106 06/21/2020 1027   CO2 23 06/21/2020 1027   CO2 27 02/09/2017 1213   BUN 12 06/21/2020 1027   BUN 16 05/22/2020 0000   BUN 12.0 02/09/2017 1213   CREATININE 0.80 06/21/2020 1027   CREATININE 0.9 02/09/2017 1213   GLU 144 05/22/2020 0000      Component  Value Date/Time   CALCIUM 8.6 (L) 06/21/2020 1027   CALCIUM 8.7 02/09/2017 1213   ALKPHOS 53 06/21/2020 1027   ALKPHOS 62 02/09/2017 1213   AST 27 06/21/2020 1027   AST 24 02/09/2017 1213   ALT 18 06/21/2020 1027   ALT 18 02/09/2017 1213   BILITOT 0.7 06/21/2020 1027   BILITOT 0.34 02/09/2017 1213      No results for input(s): INR in the last 168 hours.  Urinalysis    Component Value Date/Time   COLORURINE STRAW (A) 03/05/2016 1350    STUDIES:  No results  found.   ASSESSMENT: 75 y.o. Jade Mooney with stage IV right breast cancer as of NOV 2007 (bone metastases, subcutaneous nodules)  (1) s/p Right mastectomy 1997 for an estrogen receptor and HER-2 positive breast cancer, treated with  (a) adjuvant chemotherapy according to CALGB 9640 (taxotere x 4 cycles)  (b) high-dose chemotherapy (cyclophosphamide, carboplatin, BCNU) followed by stem cell rescue at [place] 12/22/1995  (c) biopsy-proven metastatic disease November 2007, estrogen receptor and HER-2 positive  (d) trastuzumab (dose? Duration?)  (e) tamoxifen for 5 years completed 2002  (f) participation in a vaccine study at [facility] 2003 (CEA primed dendritic cells, HER-2 primed dendritic cells)  (2) status post right iliac crest biopsy 10/10/2013 for invasive ductal carcinoma, grade 2, estrogen and progesterone receptor positive  (3) zoledronate started 10/12/2013,  repeated every 12 weeks  (a) dexa scan 07/28/2013 normal  (b) zolendronate discontinued after January 2019 dose  (4) biopsy of a scalp lesion 10/17/2013 showed metastatic breast cancer, HER-2/neu negative  (a) biopsy of a right anterior chest wall nodule August 2017 shows adenocarcinoma  (5) anastrozole started 10/28/2013, changed to letrozole 06/27/2016  (a) measurable disease = subcutaneous nodules in scalp and LLQ abdomen  (b)  CA-27-29 is informative  (c)  Repeat PET scan 12/28/2014 shows continuing response ; exam shows resolution of scalp lesions  (d) new skin lesion removed from right anterior chest wall August 2017  (e) status post radiation to the anterior lower right chest wall completed 02/25/2017 (20 sessions)   (f) PET scan 04/04/2016 shows 2 new foci of metabolic activity (T8 and manubrium)  (g) palbociclib added 04/21/2016 at 125 mg 21/7  (h) palbociclib dose dropped to 100 mg 21/7 starting with the March 2018 cycle  (i) bone scan 09/26/2016 is stable  (j) PET scan 02/03/2017 shows her bone metastases, and in  addition a 0.5 cm right middle lobe nodule which may be new  (k) MRI of the cervical spine shows collapse of the left lateral mass of C3.  There is no soft tissue mass or focal neural impingement.  (l) palbociclib reduced to 75 mg as of September 2018  (m) palbociclib held during cervical spine irradiation finished in mid February, 2019  (n) letrozole discontinued February 2019 secondary to likely progression   (6) neoplasia associated pain  (a) painful blastic lesion left femoral intertrochanteric region: Status post radiation completed November 2015  (b) pain left knee, status post TKR remotely  (c) left total hip replacement 03/12/2016  (7) palliative radiation to the cervical spine completed 05/08/2017 in [place]  (8) fulvestrant started 05/04/2017  (a) Palbociclib resumed 06/29/2017, currently at 75 mg every other day, 21 days on 7 days off  (9) denosumab/Xgeva started 06/01/2017, repeated every 28 days  (10) follow-up/staging studies  (a) PET scan 08/11/2017 shows no evidence for metabolically active tumor.  Sclerotic bone mets do not show hypermetabolism  (b) PET scan 02/11/2018 shows continuing excellent  tumor control  (c) PET scan 08/05/2018 shows same bone lesions but no active disease  (d) PET scan 01/18/2019 finds stable disease  (e) PET scan 01/03/2020 finds no evidence of disease activity.   PLAN: Jade Mooney is 14-1/2 years out from initial diagnosis of her metastatic breast cancer.  At present her disease is very well controlled and she does not have symptoms related to her disease as far as I can tell.  She is also tolerating her treatment well.  She does not like the shots in terms of the actual receiving them but in terms of side effects she is doing very well.  Her tumor marker as well as her clinical exam are all very favorable.  She will be restaged with a repeat PET scan in October of this year.  She is having a little bit more issue with her left shoulder.  I  encouraged her to contact neurosurgery/Dr. Ellene Route if that gets any worse.  That is going to be due to arthritis and not to cancer.  I also reassured her that some of the changes she is noticing around her left knee are going to be postoperative and again not related to cancer.  She has some spasms on the right chest wall which again are related to prior treatment.  Overall I am delighted at how well she is doing.  I have encouraged her to exercise more and specifically walk more regularly now that the weather is improving  Total encounter time 35 minutes.Sarajane Jews C. Zenita Kister, MD 06/21/20 11:13 AM Medical Oncology and Hematology [facility] [place], [place] [address] Tel. [phone]    Fax. [phone]   I, Wilburn Mylar, am acting as scribe for Dr. Virgie Dad. Jade Mooney.  I, Lurline Del MD, have reviewed the above documentation for accuracy and completeness, and I agree with the above.    *Total Encounter Time as defined by the Centers for Medicare and Medicaid Services includes, in addition to the face-to-face time of a patient visit (documented in the note above) non-face-to-face time: obtaining and reviewing outside history, ordering and reviewing medications, tests or procedures, care coordination (communications with other health care professionals or caregivers) and documentation in the medical record.

## 2020-06-21 ENCOUNTER — Inpatient Hospital Stay (HOSPITAL_BASED_OUTPATIENT_CLINIC_OR_DEPARTMENT_OTHER): Payer: Medicare Other | Admitting: Oncology

## 2020-06-21 ENCOUNTER — Inpatient Hospital Stay: Payer: Medicare Other

## 2020-06-21 ENCOUNTER — Other Ambulatory Visit: Payer: Self-pay

## 2020-06-21 VITALS — BP 157/76 | HR 60 | Temp 98.0°F | Resp 18 | Ht 64.0 in | Wt 172.8 lb

## 2020-06-21 DIAGNOSIS — C7989 Secondary malignant neoplasm of other specified sites: Secondary | ICD-10-CM | POA: Diagnosis not present

## 2020-06-21 DIAGNOSIS — Z170 Estrogen receptor positive status [ER+]: Secondary | ICD-10-CM

## 2020-06-21 DIAGNOSIS — C7951 Secondary malignant neoplasm of bone: Secondary | ICD-10-CM

## 2020-06-21 DIAGNOSIS — C50811 Malignant neoplasm of overlapping sites of right female breast: Secondary | ICD-10-CM | POA: Diagnosis not present

## 2020-06-21 DIAGNOSIS — C50919 Malignant neoplasm of unspecified site of unspecified female breast: Secondary | ICD-10-CM

## 2020-06-21 DIAGNOSIS — Z79899 Other long term (current) drug therapy: Secondary | ICD-10-CM | POA: Diagnosis not present

## 2020-06-21 DIAGNOSIS — R978 Other abnormal tumor markers: Secondary | ICD-10-CM | POA: Diagnosis not present

## 2020-06-21 DIAGNOSIS — Z5111 Encounter for antineoplastic chemotherapy: Secondary | ICD-10-CM | POA: Diagnosis not present

## 2020-06-21 DIAGNOSIS — C50911 Malignant neoplasm of unspecified site of right female breast: Secondary | ICD-10-CM

## 2020-06-21 DIAGNOSIS — C44501 Unspecified malignant neoplasm of skin of breast: Secondary | ICD-10-CM | POA: Diagnosis not present

## 2020-06-21 LAB — COMPREHENSIVE METABOLIC PANEL
ALT: 18 U/L (ref 0–44)
AST: 27 U/L (ref 15–41)
Albumin: 4.2 g/dL (ref 3.5–5.0)
Alkaline Phosphatase: 53 U/L (ref 38–126)
Anion gap: 11 (ref 5–15)
BUN: 12 mg/dL (ref 8–23)
CO2: 23 mmol/L (ref 22–32)
Calcium: 8.6 mg/dL — ABNORMAL LOW (ref 8.9–10.3)
Chloride: 106 mmol/L (ref 98–111)
Creatinine, Ser: 0.8 mg/dL (ref 0.44–1.00)
GFR, Estimated: >60 mL/min (ref >60)
Glucose, Bld: 91 mg/dL (ref 70–99)
Potassium: 4.2 mmol/L (ref 3.5–5.1)
Sodium: 140 mmol/L (ref 135–145)
Total Bilirubin: 0.7 mg/dL (ref 0.3–1.2)
Total Protein: 6.7 g/dL (ref 6.5–8.1)

## 2020-06-21 LAB — CBC WITH DIFFERENTIAL/PLATELET
Abs Immature Granulocytes: 0.02 10*3/uL (ref 0.00–0.07)
Basophils Absolute: 0 10*3/uL (ref 0.0–0.1)
Basophils Relative: 2 %
Eosinophils Absolute: 0.1 10*3/uL (ref 0.0–0.5)
Eosinophils Relative: 3 %
HCT: 39.6 % (ref 36.0–46.0)
Hemoglobin: 13.2 g/dL (ref 12.0–15.0)
Immature Granulocytes: 1 %
Lymphocytes Relative: 33 %
Lymphs Abs: 0.9 10*3/uL (ref 0.7–4.0)
MCH: 30.4 pg (ref 26.0–34.0)
MCHC: 33.3 g/dL (ref 30.0–36.0)
MCV: 91.2 fL (ref 80.0–100.0)
Monocytes Absolute: 0.4 10*3/uL (ref 0.1–1.0)
Monocytes Relative: 13 %
Neutro Abs: 1.3 10*3/uL — ABNORMAL LOW (ref 1.7–7.7)
Neutrophils Relative %: 48 %
Platelets: 197 10*3/uL (ref 150–400)
RBC: 4.34 MIL/uL (ref 3.87–5.11)
RDW: 14.7 % (ref 11.5–15.5)
WBC: 2.6 10*3/uL — ABNORMAL LOW (ref 4.0–10.5)
nRBC: 0 % (ref 0.0–0.2)

## 2020-06-21 MED ORDER — FULVESTRANT 250 MG/5ML IM SOLN
INTRAMUSCULAR | Status: AC
  Filled 2020-06-21: qty 10

## 2020-06-21 MED ORDER — DENOSUMAB 120 MG/1.7ML ~~LOC~~ SOLN
SUBCUTANEOUS | Status: AC
  Filled 2020-06-21: qty 1.7

## 2020-06-21 MED ORDER — DENOSUMAB 120 MG/1.7ML ~~LOC~~ SOLN
120.0000 mg | Freq: Once | SUBCUTANEOUS | Status: AC
  Administered 2020-06-21: 120 mg via SUBCUTANEOUS

## 2020-06-21 MED ORDER — FULVESTRANT 250 MG/5ML IM SOLN
500.0000 mg | Freq: Once | INTRAMUSCULAR | Status: AC
  Administered 2020-06-21: 500 mg via INTRAMUSCULAR

## 2020-06-21 NOTE — Patient Instructions (Signed)
Denosumab injection What is this medicine? DENOSUMAB (den oh sue mab) slows bone breakdown. Prolia is used to treat osteoporosis in women after menopause and in men, and in people who are taking corticosteroids for 6 months or more. Xgeva is used to treat a high calcium level due to cancer and to prevent bone fractures and other bone problems caused by multiple myeloma or cancer bone metastases. Xgeva is also used to treat giant cell tumor of the bone. This medicine may be used for other purposes; ask your health care provider or pharmacist if you have questions. COMMON BRAND NAME(S): Prolia, XGEVA What should I tell my health care provider before I take this medicine? They need to know if you have any of these conditions:  dental disease  having surgery or tooth extraction  infection  kidney disease  low levels of calcium or Vitamin D in the blood  malnutrition  on hemodialysis  skin conditions or sensitivity  thyroid or parathyroid disease  an unusual reaction to denosumab, other medicines, foods, dyes, or preservatives  pregnant or trying to get pregnant  breast-feeding How should I use this medicine? This medicine is for injection under the skin. It is given by a health care professional in a hospital or clinic setting. A special MedGuide will be given to you before each treatment. Be sure to read this information carefully each time. For Prolia, talk to your pediatrician regarding the use of this medicine in children. Special care may be needed. For Xgeva, talk to your pediatrician regarding the use of this medicine in children. While this drug may be prescribed for children as young as 13 years for selected conditions, precautions do apply. Overdosage: If you think you have taken too much of this medicine contact a poison control center or emergency room at once. NOTE: This medicine is only for you. Do not share this medicine with others. What if I miss a dose? It is  important not to miss your dose. Call your doctor or health care professional if you are unable to keep an appointment. What may interact with this medicine? Do not take this medicine with any of the following medications:  other medicines containing denosumab This medicine may also interact with the following medications:  medicines that lower your chance of fighting infection  steroid medicines like prednisone or cortisone This list may not describe all possible interactions. Give your health care provider a list of all the medicines, herbs, non-prescription drugs, or dietary supplements you use. Also tell them if you smoke, drink alcohol, or use illegal drugs. Some items may interact with your medicine. What should I watch for while using this medicine? Visit your doctor or health care professional for regular checks on your progress. Your doctor or health care professional may order blood tests and other tests to see how you are doing. Call your doctor or health care professional for advice if you get a fever, chills or sore throat, or other symptoms of a cold or flu. Do not treat yourself. This drug may decrease your body's ability to fight infection. Try to avoid being around people who are sick. You should make sure you get enough calcium and vitamin D while you are taking this medicine, unless your doctor tells you not to. Discuss the foods you eat and the vitamins you take with your health care professional. See your dentist regularly. Brush and floss your teeth as directed. Before you have any dental work done, tell your dentist you are   receiving this medicine. Do not become pregnant while taking this medicine or for 5 months after stopping it. Talk with your doctor or health care professional about your birth control options while taking this medicine. Women should inform their doctor if they wish to become pregnant or think they might be pregnant. There is a potential for serious side  effects to an unborn child. Talk to your health care professional or pharmacist for more information. What side effects may I notice from receiving this medicine? Side effects that you should report to your doctor or health care professional as soon as possible:  allergic reactions like skin rash, itching or hives, swelling of the face, lips, or tongue  bone pain  breathing problems  dizziness  jaw pain, especially after dental work  redness, blistering, peeling of the skin  signs and symptoms of infection like fever or chills; cough; sore throat; pain or trouble passing urine  signs of low calcium like fast heartbeat, muscle cramps or muscle pain; pain, tingling, numbness in the hands or feet; seizures  unusual bleeding or bruising  unusually weak or tired Side effects that usually do not require medical attention (report to your doctor or health care professional if they continue or are bothersome):  constipation  diarrhea  headache  joint pain  loss of appetite  muscle pain  runny nose  tiredness  upset stomach This list may not describe all possible side effects. Call your doctor for medical advice about side effects. You may report side effects to FDA at 1-800-FDA-1088. Where should I keep my medicine? This medicine is only given in a clinic, doctor's office, or other health care setting and will not be stored at home. NOTE: This sheet is a summary. It may not cover all possible information. If you have questions about this medicine, talk to your doctor, pharmacist, or health care provider.  2021 [employer] Standard (2017-07-17 16:10:44) Fulvestrant injection What is this medicine? FULVESTRANT (ful VES trant) blocks the effects of estrogen. It is used to treat breast cancer. This medicine may be used for other purposes; ask your health care provider or pharmacist if you have questions. COMMON BRAND NAME(S): FASLODEX What should I tell my health care  provider before I take this medicine? They need to know if you have any of these conditions:  bleeding disorders  liver disease  low blood counts, like low white cell, platelet, or red cell counts  an unusual or allergic reaction to fulvestrant, other medicines, foods, dyes, or preservatives  pregnant or trying to get pregnant  breast-feeding How should I use this medicine? This medicine is for injection into a muscle. It is usually given by a health care professional in a hospital or clinic setting. Talk to your pediatrician regarding the use of this medicine in children. Special care may be needed. Overdosage: If you think you have taken too much of this medicine contact a poison control center or emergency room at once. NOTE: This medicine is only for you. Do not share this medicine with others. What if I miss a dose? It is important not to miss your dose. Call your doctor or health care professional if you are unable to keep an appointment. What may interact with this medicine?  medicines that treat or prevent blood clots like warfarin, enoxaparin, dalteparin, apixaban, dabigatran, and rivaroxaban This list may not describe all possible interactions. Give your health care provider a list of all the medicines, herbs, non-prescription drugs, or dietary supplements you use.   Also tell them if you smoke, drink alcohol, or use illegal drugs. Some items may interact with your medicine. What should I watch for while using this medicine? Your condition will be monitored carefully while you are receiving this medicine. You will need important blood work done while you are taking this medicine. Do not become pregnant while taking this medicine or for at least 1 year after stopping it. Women of child-bearing potential will need to have a negative pregnancy test before starting this medicine. Women should inform their doctor if they wish to become pregnant or think they might be pregnant. There is  a potential for serious side effects to an unborn child. Men should inform their doctors if they wish to father a child. This medicine may lower sperm counts. Talk to your health care professional or pharmacist for more information. Do not breast-feed an infant while taking this medicine or for 1 year after the last dose. What side effects may I notice from receiving this medicine? Side effects that you should report to your doctor or health care professional as soon as possible:  allergic reactions like skin rash, itching or hives, swelling of the face, lips, or tongue  feeling faint or lightheaded, falls  pain, tingling, numbness, or weakness in the legs  signs and symptoms of infection like fever or chills; cough; flu-like symptoms; sore throat  vaginal bleeding Side effects that usually do not require medical attention (report to your doctor or health care professional if they continue or are bothersome):  aches, pains  constipation  diarrhea  headache  hot flashes  nausea, vomiting  pain at site where injected  stomach pain This list may not describe all possible side effects. Call your doctor for medical advice about side effects. You may report side effects to FDA at 1-800-FDA-1088. Where should I keep my medicine? This drug is given in a hospital or clinic and will not be stored at home. NOTE: This sheet is a summary. It may not cover all possible information. If you have questions about this medicine, talk to your doctor, pharmacist, or health care provider.  2021 [employer] Standard (2017-06-18 11:34:41)  

## 2020-06-22 ENCOUNTER — Telehealth: Payer: Self-pay | Admitting: Oncology

## 2020-06-22 LAB — CANCER ANTIGEN 27.29: CA 27.29: 28.5 U/mL (ref 0.0–38.6)

## 2020-06-22 NOTE — Telephone Encounter (Signed)
Scheduled per 3/31 los. Called pt no answer and unable to leave a msg. Mailing appt letter and calendar printout

## 2020-07-03 DIAGNOSIS — E538 Deficiency of other specified B group vitamins: Secondary | ICD-10-CM | POA: Diagnosis not present

## 2020-07-17 ENCOUNTER — Telehealth: Payer: Self-pay | Admitting: Oncology

## 2020-07-17 ENCOUNTER — Other Ambulatory Visit: Payer: Self-pay | Admitting: Hematology and Oncology

## 2020-07-17 ENCOUNTER — Inpatient Hospital Stay: Payer: Medicare Other | Attending: Oncology

## 2020-07-17 DIAGNOSIS — C50919 Malignant neoplasm of unspecified site of unspecified female breast: Secondary | ICD-10-CM

## 2020-07-17 DIAGNOSIS — C7951 Secondary malignant neoplasm of bone: Secondary | ICD-10-CM | POA: Insufficient documentation

## 2020-07-17 DIAGNOSIS — Z5111 Encounter for antineoplastic chemotherapy: Secondary | ICD-10-CM | POA: Insufficient documentation

## 2020-07-17 DIAGNOSIS — C44501 Unspecified malignant neoplasm of skin of breast: Secondary | ICD-10-CM | POA: Insufficient documentation

## 2020-07-17 DIAGNOSIS — C7989 Secondary malignant neoplasm of other specified sites: Secondary | ICD-10-CM | POA: Insufficient documentation

## 2020-07-19 ENCOUNTER — Other Ambulatory Visit: Payer: Self-pay

## 2020-07-19 ENCOUNTER — Inpatient Hospital Stay: Payer: Medicare Other

## 2020-07-19 VITALS — BP 144/68 | HR 67 | Temp 98.4°F | Resp 18 | Ht 64.0 in | Wt 173.5 lb

## 2020-07-19 DIAGNOSIS — C50919 Malignant neoplasm of unspecified site of unspecified female breast: Secondary | ICD-10-CM

## 2020-07-19 DIAGNOSIS — C7951 Secondary malignant neoplasm of bone: Secondary | ICD-10-CM | POA: Diagnosis not present

## 2020-07-19 DIAGNOSIS — Z5111 Encounter for antineoplastic chemotherapy: Secondary | ICD-10-CM | POA: Diagnosis not present

## 2020-07-19 DIAGNOSIS — C7989 Secondary malignant neoplasm of other specified sites: Secondary | ICD-10-CM | POA: Diagnosis not present

## 2020-07-19 DIAGNOSIS — C44501 Unspecified malignant neoplasm of skin of breast: Secondary | ICD-10-CM | POA: Diagnosis not present

## 2020-07-19 MED ORDER — DENOSUMAB 120 MG/1.7ML ~~LOC~~ SOLN
120.0000 mg | Freq: Once | SUBCUTANEOUS | Status: AC
  Administered 2020-07-19: 120 mg via SUBCUTANEOUS

## 2020-07-19 MED ORDER — FULVESTRANT 250 MG/5ML IM SOLN
500.0000 mg | Freq: Once | INTRAMUSCULAR | Status: AC
  Administered 2020-07-19: 500 mg via INTRAMUSCULAR

## 2020-07-19 MED ORDER — DENOSUMAB 120 MG/1.7ML ~~LOC~~ SOLN
SUBCUTANEOUS | Status: AC
  Filled 2020-07-19: qty 1.7

## 2020-07-19 MED ORDER — FULVESTRANT 250 MG/5ML IM SOLN
INTRAMUSCULAR | Status: AC
  Filled 2020-07-19: qty 10

## 2020-07-19 NOTE — Patient Instructions (Signed)
Fulvestrant injection What is this medicine? FULVESTRANT (ful VES trant) blocks the effects of estrogen. It is used to treat breast cancer. This medicine may be used for other purposes; ask your health care provider or pharmacist if you have questions. COMMON BRAND NAME(S): FASLODEX What should I tell my health care provider before I take this medicine? They need to know if you have any of these conditions:  bleeding disorders  liver disease  low blood counts, like low white cell, platelet, or red cell counts  an unusual or allergic reaction to fulvestrant, other medicines, foods, dyes, or preservatives  pregnant or trying to get pregnant  breast-feeding How should I use this medicine? This medicine is for injection into a muscle. It is usually given by a health care professional in a hospital or clinic setting. Talk to your pediatrician regarding the use of this medicine in children. Special care may be needed. Overdosage: If you think you have taken too much of this medicine contact a poison control center or emergency room at once. NOTE: This medicine is only for you. Do not share this medicine with others. What if I miss a dose? It is important not to miss your dose. Call your doctor or health care professional if you are unable to keep an appointment. What may interact with this medicine?  medicines that treat or prevent blood clots like warfarin, enoxaparin, dalteparin, apixaban, dabigatran, and rivaroxaban This list may not describe all possible interactions. Give your health care provider a list of all the medicines, herbs, non-prescription drugs, or dietary supplements you use. Also tell them if you smoke, drink alcohol, or use illegal drugs. Some items may interact with your medicine. What should I watch for while using this medicine? Your condition will be monitored carefully while you are receiving this medicine. You will need important blood work done while you are taking  this medicine. Do not become pregnant while taking this medicine or for at least 1 year after stopping it. Women of child-bearing potential will need to have a negative pregnancy test before starting this medicine. Women should inform their doctor if they wish to become pregnant or think they might be pregnant. There is a potential for serious side effects to an unborn child. Men should inform their doctors if they wish to father a child. This medicine may lower sperm counts. Talk to your health care professional or pharmacist for more information. Do not breast-feed an infant while taking this medicine or for 1 year after the last dose. What side effects may I notice from receiving this medicine? Side effects that you should report to your doctor or health care professional as soon as possible:  allergic reactions like skin rash, itching or hives, swelling of the face, lips, or tongue  feeling faint or lightheaded, falls  pain, tingling, numbness, or weakness in the legs  signs and symptoms of infection like fever or chills; cough; flu-like symptoms; sore throat  vaginal bleeding Side effects that usually do not require medical attention (report to your doctor or health care professional if they continue or are bothersome):  aches, pains  constipation  diarrhea  headache  hot flashes  nausea, vomiting  pain at site where injected  stomach pain This list may not describe all possible side effects. Call your doctor for medical advice about side effects. You may report side effects to FDA at 1-800-FDA-1088. Where should I keep my medicine? This drug is given in a hospital or clinic and will  not be stored at home. NOTE: This sheet is a summary. It may not cover all possible information. If you have questions about this medicine, talk to your doctor, pharmacist, or health care provider.  2021 [employer] Standard (2017-06-18 11:34:41) Denosumab injection What is this  medicine? DENOSUMAB (den oh sue mab) slows bone breakdown. Prolia is used to treat osteoporosis in women after menopause and in men, and in people who are taking corticosteroids for 6 months or more. Delton See is used to treat a high calcium level due to cancer and to prevent bone fractures and other bone problems caused by multiple myeloma or cancer bone metastases. Delton See is also used to treat giant cell tumor of the bone. This medicine may be used for other purposes; ask your health care provider or pharmacist if you have questions. COMMON BRAND NAME(S): Prolia, XGEVA What should I tell my health care provider before I take this medicine? They need to know if you have any of these conditions:  dental disease  having surgery or tooth extraction  infection  kidney disease  low levels of calcium or Vitamin D in the blood  malnutrition  on hemodialysis  skin conditions or sensitivity  thyroid or parathyroid disease  an unusual reaction to denosumab, other medicines, foods, dyes, or preservatives  pregnant or trying to get pregnant  breast-feeding How should I use this medicine? This medicine is for injection under the skin. It is given by a health care professional in a hospital or clinic setting. A special MedGuide will be given to you before each treatment. Be sure to read this information carefully each time. For Prolia, talk to your pediatrician regarding the use of this medicine in children. Special care may be needed. For Delton See, talk to your pediatrician regarding the use of this medicine in children. While this drug may be prescribed for children as young as 13 years for selected conditions, precautions do apply. Overdosage: If you think you have taken too much of this medicine contact a poison control center or emergency room at once. NOTE: This medicine is only for you. Do not share this medicine with others. What if I miss a dose? It is important not to miss your dose. Call  your doctor or health care professional if you are unable to keep an appointment. What may interact with this medicine? Do not take this medicine with any of the following medications:  other medicines containing denosumab This medicine may also interact with the following medications:  medicines that lower your chance of fighting infection  steroid medicines like prednisone or cortisone This list may not describe all possible interactions. Give your health care provider a list of all the medicines, herbs, non-prescription drugs, or dietary supplements you use. Also tell them if you smoke, drink alcohol, or use illegal drugs. Some items may interact with your medicine. What should I watch for while using this medicine? Visit your doctor or health care professional for regular checks on your progress. Your doctor or health care professional may order blood tests and other tests to see how you are doing. Call your doctor or health care professional for advice if you get a fever, chills or sore throat, or other symptoms of a cold or flu. Do not treat yourself. This drug may decrease your body's ability to fight infection. Try to avoid being around people who are sick. You should make sure you get enough calcium and vitamin D while you are taking this medicine, unless your doctor tells  you not to. Discuss the foods you eat and the vitamins you take with your health care professional. See your dentist regularly. Brush and floss your teeth as directed. Before you have any dental work done, tell your dentist you are receiving this medicine. Do not become pregnant while taking this medicine or for 5 months after stopping it. Talk with your doctor or health care professional about your birth control options while taking this medicine. Women should inform their doctor if they wish to become pregnant or think they might be pregnant. There is a potential for serious side effects to an unborn child. Talk to your  health care professional or pharmacist for more information. What side effects may I notice from receiving this medicine? Side effects that you should report to your doctor or health care professional as soon as possible:  allergic reactions like skin rash, itching or hives, swelling of the face, lips, or tongue  bone pain  breathing problems  dizziness  jaw pain, especially after dental work  redness, blistering, peeling of the skin  signs and symptoms of infection like fever or chills; cough; sore throat; pain or trouble passing urine  signs of low calcium like fast heartbeat, muscle cramps or muscle pain; pain, tingling, numbness in the hands or feet; seizures  unusual bleeding or bruising  unusually weak or tired Side effects that usually do not require medical attention (report to your doctor or health care professional if they continue or are bothersome):  constipation  diarrhea  headache  joint pain  loss of appetite  muscle pain  runny nose  tiredness  upset stomach This list may not describe all possible side effects. Call your doctor for medical advice about side effects. You may report side effects to FDA at 1-800-FDA-1088. Where should I keep my medicine? This medicine is only given in a clinic, doctor's office, or other health care setting and will not be stored at home. NOTE: This sheet is a summary. It may not cover all possible information. If you have questions about this medicine, talk to your doctor, pharmacist, or health care provider.  2021 [employer] Standard (2017-07-17 16:10:44)  

## 2020-07-21 DIAGNOSIS — K219 Gastro-esophageal reflux disease without esophagitis: Secondary | ICD-10-CM | POA: Diagnosis not present

## 2020-07-21 DIAGNOSIS — E785 Hyperlipidemia, unspecified: Secondary | ICD-10-CM | POA: Diagnosis not present

## 2020-07-21 DIAGNOSIS — E039 Hypothyroidism, unspecified: Secondary | ICD-10-CM | POA: Diagnosis not present

## 2020-07-30 DIAGNOSIS — E538 Deficiency of other specified B group vitamins: Secondary | ICD-10-CM | POA: Diagnosis not present

## 2020-07-30 DIAGNOSIS — Z6830 Body mass index (BMI) 30.0-30.9, adult: Secondary | ICD-10-CM | POA: Diagnosis not present

## 2020-07-30 DIAGNOSIS — E669 Obesity, unspecified: Secondary | ICD-10-CM | POA: Diagnosis not present

## 2020-07-30 DIAGNOSIS — M19072 Primary osteoarthritis, left ankle and foot: Secondary | ICD-10-CM | POA: Diagnosis not present

## 2020-08-14 ENCOUNTER — Inpatient Hospital Stay: Payer: Medicare Other | Attending: Oncology

## 2020-08-14 ENCOUNTER — Other Ambulatory Visit: Payer: Self-pay | Admitting: Hematology and Oncology

## 2020-08-14 ENCOUNTER — Telehealth: Payer: Self-pay | Admitting: Oncology

## 2020-08-14 ENCOUNTER — Encounter: Payer: Self-pay | Admitting: Hematology and Oncology

## 2020-08-14 ENCOUNTER — Other Ambulatory Visit: Payer: Self-pay

## 2020-08-14 DIAGNOSIS — C50919 Malignant neoplasm of unspecified site of unspecified female breast: Secondary | ICD-10-CM

## 2020-08-14 DIAGNOSIS — C7951 Secondary malignant neoplasm of bone: Secondary | ICD-10-CM | POA: Diagnosis not present

## 2020-08-14 DIAGNOSIS — D649 Anemia, unspecified: Secondary | ICD-10-CM | POA: Diagnosis not present

## 2020-08-14 DIAGNOSIS — C44501 Unspecified malignant neoplasm of skin of breast: Secondary | ICD-10-CM | POA: Insufficient documentation

## 2020-08-14 DIAGNOSIS — Z5111 Encounter for antineoplastic chemotherapy: Secondary | ICD-10-CM | POA: Insufficient documentation

## 2020-08-14 LAB — CBC: RBC: 4.27 (ref 3.87–5.11)

## 2020-08-14 LAB — HEPATIC FUNCTION PANEL
ALT: 18 (ref 7–35)
AST: 27 (ref 13–35)
Alkaline Phosphatase: 47 (ref 25–125)
Bilirubin, Total: 0.5

## 2020-08-14 LAB — CBC AND DIFFERENTIAL
HCT: 39 (ref 36–46)
Hemoglobin: 13.1 (ref 12.0–16.0)
Neutrophils Absolute: 1.46
Platelets: 175 (ref 150–399)
WBC: 2.8

## 2020-08-14 LAB — BASIC METABOLIC PANEL
BUN: 13 (ref 4–21)
CO2: 24 — AB (ref 13–22)
Chloride: 102 (ref 99–108)
Creatinine: 0.7 (ref 0.5–1.1)
Glucose: 108
Potassium: 4.1 (ref 3.4–5.3)
Sodium: 134 — AB (ref 137–147)

## 2020-08-14 LAB — COMPREHENSIVE METABOLIC PANEL
Albumin: 4.4 (ref 3.5–5.0)
Calcium: 8.9 (ref 8.7–10.7)

## 2020-08-14 NOTE — Telephone Encounter (Signed)
Per 5/24 los next appt scheduled and given to patient

## 2020-08-15 ENCOUNTER — Encounter: Payer: Self-pay | Admitting: Oncology

## 2020-08-16 ENCOUNTER — Inpatient Hospital Stay: Payer: Medicare Other

## 2020-08-16 ENCOUNTER — Other Ambulatory Visit: Payer: Self-pay

## 2020-08-16 VITALS — BP 147/86 | HR 70 | Temp 98.1°F | Resp 18 | Ht 64.0 in | Wt 170.1 lb

## 2020-08-16 DIAGNOSIS — C7951 Secondary malignant neoplasm of bone: Secondary | ICD-10-CM | POA: Diagnosis not present

## 2020-08-16 DIAGNOSIS — C44501 Unspecified malignant neoplasm of skin of breast: Secondary | ICD-10-CM | POA: Diagnosis not present

## 2020-08-16 DIAGNOSIS — C50919 Malignant neoplasm of unspecified site of unspecified female breast: Secondary | ICD-10-CM

## 2020-08-16 DIAGNOSIS — Z5111 Encounter for antineoplastic chemotherapy: Secondary | ICD-10-CM | POA: Diagnosis not present

## 2020-08-16 MED ORDER — DENOSUMAB 120 MG/1.7ML ~~LOC~~ SOLN
SUBCUTANEOUS | Status: AC
  Filled 2020-08-16: qty 1.7

## 2020-08-16 MED ORDER — FULVESTRANT 250 MG/5ML IM SOLN
500.0000 mg | Freq: Once | INTRAMUSCULAR | Status: AC
  Administered 2020-08-16: 500 mg via INTRAMUSCULAR

## 2020-08-16 MED ORDER — DENOSUMAB 120 MG/1.7ML ~~LOC~~ SOLN
120.0000 mg | Freq: Once | SUBCUTANEOUS | Status: AC
Start: 2020-08-16 — End: 2020-08-16
  Administered 2020-08-16: 120 mg via SUBCUTANEOUS

## 2020-08-16 MED ORDER — FULVESTRANT 250 MG/5ML IM SOLN
INTRAMUSCULAR | Status: AC
  Filled 2020-08-16: qty 10

## 2020-08-16 NOTE — Patient Instructions (Signed)
Fulvestrant injection What is this medicine? FULVESTRANT (ful VES trant) blocks the effects of estrogen. It is used to treat breast cancer. This medicine may be used for other purposes; ask your health care provider or pharmacist if you have questions. COMMON BRAND NAME(S): FASLODEX What should I tell my health care provider before I take this medicine? They need to know if you have any of these conditions:  bleeding disorders  liver disease  low blood counts, like low white cell, platelet, or red cell counts  an unusual or allergic reaction to fulvestrant, other medicines, foods, dyes, or preservatives  pregnant or trying to get pregnant  breast-feeding How should I use this medicine? This medicine is for injection into a muscle. It is usually given by a health care professional in a hospital or clinic setting. Talk to your pediatrician regarding the use of this medicine in children. Special care may be needed. Overdosage: If you think you have taken too much of this medicine contact a poison control center or emergency room at once. NOTE: This medicine is only for you. Do not share this medicine with others. What if I miss a dose? It is important not to miss your dose. Call your doctor or health care professional if you are unable to keep an appointment. What may interact with this medicine?  medicines that treat or prevent blood clots like warfarin, enoxaparin, dalteparin, apixaban, dabigatran, and rivaroxaban This list may not describe all possible interactions. Give your health care provider a list of all the medicines, herbs, non-prescription drugs, or dietary supplements you use. Also tell them if you smoke, drink alcohol, or use illegal drugs. Some items may interact with your medicine. What should I watch for while using this medicine? Your condition will be monitored carefully while you are receiving this medicine. You will need important blood work done while you are taking  this medicine. Do not become pregnant while taking this medicine or for at least 1 year after stopping it. Women of child-bearing potential will need to have a negative pregnancy test before starting this medicine. Women should inform their doctor if they wish to become pregnant or think they might be pregnant. There is a potential for serious side effects to an unborn child. Men should inform their doctors if they wish to father a child. This medicine may lower sperm counts. Talk to your health care professional or pharmacist for more information. Do not breast-feed an infant while taking this medicine or for 1 year after the last dose. What side effects may I notice from receiving this medicine? Side effects that you should report to your doctor or health care professional as soon as possible:  allergic reactions like skin rash, itching or hives, swelling of the face, lips, or tongue  feeling faint or lightheaded, falls  pain, tingling, numbness, or weakness in the legs  signs and symptoms of infection like fever or chills; cough; flu-like symptoms; sore throat  vaginal bleeding Side effects that usually do not require medical attention (report to your doctor or health care professional if they continue or are bothersome):  aches, pains  constipation  diarrhea  headache  hot flashes  nausea, vomiting  pain at site where injected  stomach pain This list may not describe all possible side effects. Call your doctor for medical advice about side effects. You may report side effects to FDA at 1-800-FDA-1088. Where should I keep my medicine? This drug is given in a hospital or clinic and will  not be stored at home. NOTE: This sheet is a summary. It may not cover all possible information. If you have questions about this medicine, talk to your doctor, pharmacist, or health care provider.  2021 [employer] Standard (2017-06-18 11:34:41) Denosumab injection What is this  medicine? DENOSUMAB (den oh sue mab) slows bone breakdown. Prolia is used to treat osteoporosis in women after menopause and in men, and in people who are taking corticosteroids for 6 months or more. Delton See is used to treat a high calcium level due to cancer and to prevent bone fractures and other bone problems caused by multiple myeloma or cancer bone metastases. Delton See is also used to treat giant cell tumor of the bone. This medicine may be used for other purposes; ask your health care provider or pharmacist if you have questions. COMMON BRAND NAME(S): Prolia, XGEVA What should I tell my health care provider before I take this medicine? They need to know if you have any of these conditions:  dental disease  having surgery or tooth extraction  infection  kidney disease  low levels of calcium or Vitamin D in the blood  malnutrition  on hemodialysis  skin conditions or sensitivity  thyroid or parathyroid disease  an unusual reaction to denosumab, other medicines, foods, dyes, or preservatives  pregnant or trying to get pregnant  breast-feeding How should I use this medicine? This medicine is for injection under the skin. It is given by a health care professional in a hospital or clinic setting. A special MedGuide will be given to you before each treatment. Be sure to read this information carefully each time. For Prolia, talk to your pediatrician regarding the use of this medicine in children. Special care may be needed. For Delton See, talk to your pediatrician regarding the use of this medicine in children. While this drug may be prescribed for children as young as 13 years for selected conditions, precautions do apply. Overdosage: If you think you have taken too much of this medicine contact a poison control center or emergency room at once. NOTE: This medicine is only for you. Do not share this medicine with others. What if I miss a dose? It is important not to miss your dose. Call  your doctor or health care professional if you are unable to keep an appointment. What may interact with this medicine? Do not take this medicine with any of the following medications:  other medicines containing denosumab This medicine may also interact with the following medications:  medicines that lower your chance of fighting infection  steroid medicines like prednisone or cortisone This list may not describe all possible interactions. Give your health care provider a list of all the medicines, herbs, non-prescription drugs, or dietary supplements you use. Also tell them if you smoke, drink alcohol, or use illegal drugs. Some items may interact with your medicine. What should I watch for while using this medicine? Visit your doctor or health care professional for regular checks on your progress. Your doctor or health care professional may order blood tests and other tests to see how you are doing. Call your doctor or health care professional for advice if you get a fever, chills or sore throat, or other symptoms of a cold or flu. Do not treat yourself. This drug may decrease your body's ability to fight infection. Try to avoid being around people who are sick. You should make sure you get enough calcium and vitamin D while you are taking this medicine, unless your doctor tells  you not to. Discuss the foods you eat and the vitamins you take with your health care professional. See your dentist regularly. Brush and floss your teeth as directed. Before you have any dental work done, tell your dentist you are receiving this medicine. Do not become pregnant while taking this medicine or for 5 months after stopping it. Talk with your doctor or health care professional about your birth control options while taking this medicine. Women should inform their doctor if they wish to become pregnant or think they might be pregnant. There is a potential for serious side effects to an unborn child. Talk to your  health care professional or pharmacist for more information. What side effects may I notice from receiving this medicine? Side effects that you should report to your doctor or health care professional as soon as possible:  allergic reactions like skin rash, itching or hives, swelling of the face, lips, or tongue  bone pain  breathing problems  dizziness  jaw pain, especially after dental work  redness, blistering, peeling of the skin  signs and symptoms of infection like fever or chills; cough; sore throat; pain or trouble passing urine  signs of low calcium like fast heartbeat, muscle cramps or muscle pain; pain, tingling, numbness in the hands or feet; seizures  unusual bleeding or bruising  unusually weak or tired Side effects that usually do not require medical attention (report to your doctor or health care professional if they continue or are bothersome):  constipation  diarrhea  headache  joint pain  loss of appetite  muscle pain  runny nose  tiredness  upset stomach This list may not describe all possible side effects. Call your doctor for medical advice about side effects. You may report side effects to FDA at 1-800-FDA-1088. Where should I keep my medicine? This medicine is only given in a clinic, doctor's office, or other health care setting and will not be stored at home. NOTE: This sheet is a summary. It may not cover all possible information. If you have questions about this medicine, talk to your doctor, pharmacist, or health care provider.  2021 [employer] Standard (2017-07-17 16:10:44)  

## 2020-08-16 NOTE — Progress Notes (Signed)
Pt d/c stable at 1030

## 2020-08-23 DIAGNOSIS — M778 Other enthesopathies, not elsewhere classified: Secondary | ICD-10-CM | POA: Diagnosis not present

## 2020-08-23 DIAGNOSIS — M19072 Primary osteoarthritis, left ankle and foot: Secondary | ICD-10-CM | POA: Diagnosis not present

## 2020-08-30 DIAGNOSIS — D510 Vitamin B12 deficiency anemia due to intrinsic factor deficiency: Secondary | ICD-10-CM | POA: Diagnosis not present

## 2020-09-06 ENCOUNTER — Other Ambulatory Visit: Payer: Self-pay | Admitting: Oncology

## 2020-09-10 DIAGNOSIS — Z885 Allergy status to narcotic agent status: Secondary | ICD-10-CM | POA: Diagnosis not present

## 2020-09-10 DIAGNOSIS — Z888 Allergy status to other drugs, medicaments and biological substances status: Secondary | ICD-10-CM | POA: Diagnosis not present

## 2020-09-10 DIAGNOSIS — K219 Gastro-esophageal reflux disease without esophagitis: Secondary | ICD-10-CM | POA: Diagnosis not present

## 2020-09-10 DIAGNOSIS — Z8673 Personal history of transient ischemic attack (TIA), and cerebral infarction without residual deficits: Secondary | ICD-10-CM | POA: Diagnosis not present

## 2020-09-10 DIAGNOSIS — G2581 Restless legs syndrome: Secondary | ICD-10-CM | POA: Diagnosis not present

## 2020-09-10 DIAGNOSIS — Z880 Allergy status to penicillin: Secondary | ICD-10-CM | POA: Diagnosis not present

## 2020-09-10 DIAGNOSIS — C50919 Malignant neoplasm of unspecified site of unspecified female breast: Secondary | ICD-10-CM | POA: Diagnosis not present

## 2020-09-10 DIAGNOSIS — D72819 Decreased white blood cell count, unspecified: Secondary | ICD-10-CM | POA: Diagnosis not present

## 2020-09-10 DIAGNOSIS — R4701 Aphasia: Secondary | ICD-10-CM | POA: Diagnosis not present

## 2020-09-10 DIAGNOSIS — G459 Transient cerebral ischemic attack, unspecified: Secondary | ICD-10-CM | POA: Diagnosis not present

## 2020-09-10 DIAGNOSIS — R4182 Altered mental status, unspecified: Secondary | ICD-10-CM | POA: Diagnosis not present

## 2020-09-10 DIAGNOSIS — I10 Essential (primary) hypertension: Secondary | ICD-10-CM | POA: Diagnosis not present

## 2020-09-10 DIAGNOSIS — Z87891 Personal history of nicotine dependence: Secondary | ICD-10-CM | POA: Diagnosis not present

## 2020-09-10 DIAGNOSIS — E039 Hypothyroidism, unspecified: Secondary | ICD-10-CM | POA: Diagnosis not present

## 2020-09-10 DIAGNOSIS — E7800 Pure hypercholesterolemia, unspecified: Secondary | ICD-10-CM | POA: Diagnosis not present

## 2020-09-10 DIAGNOSIS — Z79899 Other long term (current) drug therapy: Secondary | ICD-10-CM | POA: Diagnosis not present

## 2020-09-11 DIAGNOSIS — R4701 Aphasia: Secondary | ICD-10-CM | POA: Diagnosis not present

## 2020-09-11 DIAGNOSIS — C7951 Secondary malignant neoplasm of bone: Secondary | ICD-10-CM | POA: Diagnosis not present

## 2020-09-11 DIAGNOSIS — I361 Nonrheumatic tricuspid (valve) insufficiency: Secondary | ICD-10-CM | POA: Diagnosis not present

## 2020-09-11 DIAGNOSIS — I6521 Occlusion and stenosis of right carotid artery: Secondary | ICD-10-CM | POA: Diagnosis not present

## 2020-09-11 DIAGNOSIS — Z853 Personal history of malignant neoplasm of breast: Secondary | ICD-10-CM | POA: Diagnosis not present

## 2020-09-11 DIAGNOSIS — G9389 Other specified disorders of brain: Secondary | ICD-10-CM | POA: Diagnosis not present

## 2020-09-11 DIAGNOSIS — I340 Nonrheumatic mitral (valve) insufficiency: Secondary | ICD-10-CM | POA: Diagnosis not present

## 2020-09-12 NOTE — Progress Notes (Signed)
Jade Mooney  [phone] [phone]) [phone]   ID: Jade Mooney DOB: [birth year]  MR#: [id number]  CSN#:[id number]  Patient Care Team: Street, Sharon Mt, MD as PCP - General (Family Medicine) Clarene Essex, MD as Consulting Physician (Gastroenterology) Shaira Sova, Virgie Dad, MD as Consulting Physician (Oncology) Gaynelle Arabian, MD as Consulting Physician (Orthopedic Surgery) Gatha Mayer, MD as Consulting Physician (Radiation Oncology) Kristeen Miss, MD as Consulting Physician (Neurosurgery) Derwood Kaplan, MD as Consulting Physician (Oncology)   CHIEF COMPLAINT: metastatic breast cancer (s/p right mastectomy)  CURRENT TREATMENT: fulvestrant, denosumab/Xgeva, palbociclib   INTERVAL HISTORY: [name] returns today for follow-up and treatment of her estrogen receptor positive breast cancer.  She continues on palbociclib/Ibrance at 75 mg every other day, 21 days on 7 days off.  She tolerates this with no side effects that she is aware of.  Her [place] today remains favorable  She also continues on fulvestrant, which she receives today and every 28 days.  Aside from the discomfort of the administration (which can last up to a week) she tolerates this well.    Finally, she also continues on denosumab/Xgeva.  She receives that also every 28 days and again 2 out of every 3 doses she receives in [place] and every third dose including today's she receives it here.  There have been no dental issues to date.  We are following the CA 27-29: Lab Results  Component Value Date   CA2729 28.5 06/21/2020   CA2729 28.5 05/22/2020   CA2729 30.8 03/29/2020   CA2729 25.1 02/28/2020   CA2729 32.6 02/01/2020    REVIEW OF SYSTEMS: Alycea tells me she had a transient ischemic attack earlier this week.  She was in [facility] and had a CT of the brain and an MRI as well as other studies including an echocardiogram (not TEE).  She had neurologic evaluation and  she was started on a full aspirin in addition to Plavix.  She has also been referred to [facility] for further neurologic work-up but she does not yet have that appointment.  She tells me her symptoms include speech problems but no focal weakness.  There resolved over 45 minutes.  Currently she is "back to normal".  COVID 19 VACCINATION STATUS:    BREAST CANCER HISTORY: From the earlier summary notes:  Jade Mooney was referred to Dr. Clarene Essex for evaluation of abdominal discomfort and nausea. Exam and blood work including liver function tests was unremarkable aside from normochromic normocytic anemia. Cholecystitis was suspected and the patient underwent endoscopy followed by abdominal/ pelvic CT scan on 09/29/2013. This showed showed a normal gallbladder. Incidental findings included multiple simple cysts in the liver and aortic atherosclerosis. However mixed lytic and sclerotic bony lesions were noted. On 10/10/2013 the patient underwent biopsy of the right iliac bone, and this showed ([id number]) metastatic invasive ductal carcinoma, grade 2, estrogen and progesterone receptor strongly positive.  Jade Mooney has a remote history of breast cancer, which we tried to reconstruct. She was originally diagnosed early in 1997 with stage III disease, and underwent right mastectomy followed by adjuvant chemotherapy. She then participated in a [facility] protocol with high-dose chemotherapy followed by stem cell rescue 12/22/1995. Unfortunately later that year liver lesions were noted and were biopsy-proven to be metastatic breast cancer. This was not felt to be resectable. However the tumor was estrogen receptor positive and HER-2 positive. The patient was treated with Herceptin (she does not know for what period of time) and was then on tamoxifen until  November of 2002. She also participated in a vaccine study at [facility].  Delvina's subsequent history is as detailed below   PAST MEDICAL HISTORY: Past Medical History:   Diagnosis Date   Anxiety    Cancer [facility])    Breast 1997 right tx with mastectomy and chemo, metastatic now   GERD (gastroesophageal reflux disease)    Hypercholesterolemia    Hypertension    Hypothyroidism    Osteoarthritis    oa   Personal history of chemotherapy    Personal history of radiation therapy    Secondary malignant neoplasm of bone and bone marrow (HCC)    TIA (transient ischemic attack) last 76-17-17   x 2 total    PAST SURGICAL HISTORY: Past Surgical History:  Procedure Laterality Date   CATARACT EXTRACTION, BILATERAL     MASTECTOMY MODIFIED RADICAL Right 1997   porta cath insertion  1997   later removed   radiation tx  finished 02-25-16   x 20 tx   TONSILLECTOMY     TOTAL HIP ARTHROPLASTY Left 03/12/2016   Procedure: LEFT TOTAL HIP ARTHROPLASTY ANTERIOR APPROACH;  Surgeon: Gaynelle Arabian, MD;  Location: [facility];  Service: Orthopedics;  Laterality: Left;   TOTAL KNEE ARTHROPLASTY Left    TUBAL LIGATION      FAMILY HISTORY: Family History  Problem Relation Age of Onset   Breast cancer Maternal Aunt   The patient's father died at the age of 39 from heart disease. The patient's mother died at the age of 37. The patient had no brothers, one sister. One of the patient's mother's 5 sisters was diagnosed with breast cancer in her 66s   GYNECOLOGIC HISTORY:  No LMP recorded. Patient is postmenopausal. Menarche age 22, the patient is GX P0. She stopped having periods approximately 1994. She used hormone replacement until 1997. 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 Her husband Jade Mooney (goes by [name]") was [employer]. of information all services at [employer]. They live alone, with no pets.   ADVANCED DIRECTIVES: In place   HEALTH MAINTENANCE: Social History   Tobacco Use   Smoking status: Never   Smokeless tobacco: Never  Substance Use Topics   Alcohol  use: Yes    Comment: 1 glass wine 4-5 x week   Drug use: No    Colonoscopy: 2013  PAP: 2010  Bone density: 2012  Lipid panel:  Allergies  Allergen Reactions   Acetaminophen    Amoxicillin Hives    Has patient had a PCN reaction causing immediate rash, facial/tongue/throat swelling, SOB or lightheadedness with hypotension:unsure Has patient had a PCN reaction causing severe rash involving mucus membranes or skin necrosis:No Has patient had a PCN reaction that required hospitalization:No Has patient had a PCN reaction occurring within the last 10 years: Yes If all of the above answers are "NO", then may proceed with Cephalosporin use.    Aspirin    Erythromycin Other (See Comments)   Oxycodone    Oxycodone Hcl    Percocet [Oxycodone-Acetaminophen] Nausea And Vomiting   Percodan [Oxycodone-Aspirin] Nausea And Vomiting   Tramadol Itching    Current Outpatient Medications  Medication Sig Dispense Refill   acetaminophen (TYLENOL) 500 MG tablet Take 1,000 mg by mouth every 6 (six) hours as needed for mild pain. Reported on 04/17/2015     ascorbic acid (VITAMIN C) 1000 MG tablet Take 1,000 mg by mouth daily.  beta carotene 10000 UNIT capsule Take 10,000 Units by mouth daily.     calcium carbonate (OSCAL) 1500 (600 Ca) MG TABS tablet Take 600 mg of elemental calcium by mouth 2 (two) times daily with a meal.     calcium citrate-vitamin D (CITRACAL+D) 315-200 MG-UNIT tablet Take 315 tablets by mouth in the morning and at bedtime.     calcium gluconate 500 MG tablet Take 1 tablet by mouth 3 (three) times daily.     cholecalciferol (VITAMIN D) 1000 units tablet Take 1,000 Units by mouth daily.     clopidogrel (PLAVIX) 75 MG tablet Take 1 tablet (75 mg total) by mouth daily.     cyanocobalamin (,VITAMIN B-12,) 1000 MCG/ML injection Inject 1,000 mcg into the muscle once.     Denosumab (XGEVA [place]) Inject into the skin.     famotidine (PEPCID) 40 MG tablet Take 1 tablet (40 mg total) by mouth  2 (two) times daily.     Fulvestrant (FASLODEX IM) Inject 500 mg into the muscle.     glucosamine-chondroitin 500-400 MG tablet Take 1 tablet by mouth 3 (three) times daily.     ketotifen (ZADITOR) 0.025 % ophthalmic solution 1 drop 2 (two) times daily.     Lactobacillus (LACTINEX) PACK Take 100 Million Cells by mouth daily.     levothyroxine (SYNTHROID, LEVOTHROID) 88 MCG tablet Take 88 mcg by mouth daily before breakfast.     Lutein 6 MG TABS Take 6 mg by mouth 2 (two) times daily.      meclizine (ANTIVERT) 25 MG tablet Take 25 mg by mouth 3 (three) times daily as needed for dizziness.     metroNIDAZOLE (METROCREAM) 0.75 % cream Apply 1 application topically 2 (two) times daily.     METRONIDAZOLE, TOPICAL, 0.75 % LOTN Apply 1 application topically at bedtime. Apply to face for rosacea     Multiple Vitamins-Minerals (MULTIVITAMIN WITH MINERALS) tablet Take 1 tablet by mouth daily.     Omega-3 1000 MG CAPS Take 300-1,000 mg by mouth in the morning and at bedtime.     palbociclib (IBRANCE) 75 MG capsule Take 1 capsule (75 mg total) by mouth daily with breakfast. Take whole with food on days 1-21, every 28 days. 21 capsule 6   pantoprazole (PROTONIX) 40 MG tablet Take 40 mg by mouth 2 (two) times daily. Before breakfast and before supper     Polyethyl Glycol-Propyl Glycol 0.4-0.3 % SOLN Place 1-2 drops into both eyes 2 (two) times daily.     prednisoLONE acetate (PRED FORTE) 1 % ophthalmic suspension Place 1 drop into both eyes daily.     rOPINIRole (REQUIP) 2 MG tablet Take 3-4 mg by mouth at bedtime.     rosuvastatin (CRESTOR) 20 MG tablet Take 1 tablet (20 mg total) by mouth daily.     Specialty Vitamins Products (ELON MATRIX 5000) TABS Take 5,000 mcg by mouth daily.     vitamin E 180 MG (400 UNITS) capsule Take 180 Units by mouth daily.     zinc gluconate 50 MG tablet Take 50 mg by mouth daily.     No current facility-administered medications for this visit.    OBJECTIVE: White woman who  appears stated age 20:   09/13/20 0915  BP: (!) 146/71  Pulse: (!) 59  Resp: 18  Temp: (!) 97.5 F (36.4 C)  SpO2: 99%     Body mass index is 29.32 kg/m.    ECOG FS:1 - Symptomatic but completely ambulatory  Sclerae unicteric,  EOMs intact Wearing a mask No cervical or supraclavicular adenopathy Lungs no rales or rhonchi Heart regular rate and rhythm Abd soft, nontender, positive bowel sounds MSK no focal spinal tenderness, no upper extremity lymphedema Neuro: Entirely nonfocal, well oriented, normal speech and affect Breasts: Status post right mastectomy and radiation, with a well-defined area of telangiectasias as previously noted.  The left breast and both axillae are benign.   LAB RESULTS:  CMP     Component Value Date/Time   NA 134 (A) 08/14/2020 0000   NA 138 02/09/2017 1213   K 4.1 08/14/2020 0000   K 3.9 02/09/2017 1213   CL 102 08/14/2020 0000   CO2 24 (A) 08/14/2020 0000   CO2 27 02/09/2017 1213   GLUCOSE 91 06/21/2020 1027   GLUCOSE 100 02/09/2017 1213   BUN 13 08/14/2020 0000   BUN 12.0 02/09/2017 1213   CREATININE 0.7 08/14/2020 0000   CREATININE 0.80 06/21/2020 1027   CREATININE 0.9 02/09/2017 1213   CALCIUM 8.9 08/14/2020 0000   CALCIUM 8.7 02/09/2017 1213   PROT 6.7 06/21/2020 1027   PROT 6.5 02/09/2017 1213   ALBUMIN 4.4 08/14/2020 0000   ALBUMIN 4.1 02/09/2017 1213   AST 27 08/14/2020 0000   AST 24 02/09/2017 1213   ALT 18 08/14/2020 0000   ALT 18 02/09/2017 1213   ALKPHOS 47 08/14/2020 0000   ALKPHOS 62 02/09/2017 1213   BILITOT 0.7 06/21/2020 1027   BILITOT 0.34 02/09/2017 1213   GFRNONAA >60 06/21/2020 1027   GFRAA >60 10/13/2019 1201   No results found for: SPEP  Lab Results  Component Value Date   WBC 2.6 (L) 09/13/2020   NEUTROABS 1.4 (L) 09/13/2020   HGB 13.2 09/13/2020   HCT 39.4 09/13/2020   MCV 91.6 09/13/2020   PLT 198 09/13/2020      Chemistry      Component Value Date/Time   NA 134 (A) 08/14/2020 0000   NA  138 02/09/2017 1213   K 4.1 08/14/2020 0000   K 3.9 02/09/2017 1213   CL 102 08/14/2020 0000   CO2 24 (A) 08/14/2020 0000   CO2 27 02/09/2017 1213   BUN 13 08/14/2020 0000   BUN 12.0 02/09/2017 1213   CREATININE 0.7 08/14/2020 0000   CREATININE 0.80 06/21/2020 1027   CREATININE 0.9 02/09/2017 1213   GLU 108 08/14/2020 0000      Component Value Date/Time   CALCIUM 8.9 08/14/2020 0000   CALCIUM 8.7 02/09/2017 1213   ALKPHOS 47 08/14/2020 0000   ALKPHOS 62 02/09/2017 1213   AST 27 08/14/2020 0000   AST 24 02/09/2017 1213   ALT 18 08/14/2020 0000   ALT 18 02/09/2017 1213   BILITOT 0.7 06/21/2020 1027   BILITOT 0.34 02/09/2017 1213      No results for input(s): INR in the last 168 hours.  Urinalysis    Component Value Date/Time   COLORURINE STRAW (A) 03/05/2016 1350    STUDIES:  No results found.   ASSESSMENT: 75 y.o. [place] woman with stage IV right breast cancer as of NOV 2007 (bone metastases, subcutaneous nodules)  (1) s/p Right mastectomy 1997 for an estrogen receptor and HER-2 positive breast cancer, treated with  (a) adjuvant chemotherapy according to CALGB 9640 (taxotere x 4 cycles)  (b) high-dose chemotherapy (cyclophosphamide, carboplatin, BCNU) followed by stem cell rescue at [place] 12/22/1995  (c) biopsy-proven metastatic disease November 2007, estrogen receptor and HER-2 positive  (d) trastuzumab (dose? Duration?)  (e) tamoxifen for 5 years completed  2002  (f) participation in a vaccine study at [facility] 2003 (CEA primed dendritic cells, HER-2 primed dendritic cells)  (2) status post right iliac crest biopsy 10/10/2013 for invasive ductal carcinoma, grade 2, estrogen and progesterone receptor positive  (3) zoledronate started 10/12/2013,  repeated every 12 weeks  (a) dexa scan 07/28/2013 normal  (b) zolendronate discontinued after January 2019 dose  (4) biopsy of a scalp lesion 10/17/2013 showed metastatic breast cancer, HER-2/neu negative  (a) biopsy of a  right anterior chest wall nodule August 2017 shows adenocarcinoma  (5) anastrozole started 10/28/2013, changed to letrozole 06/27/2016  (a) measurable disease = subcutaneous nodules in scalp and LLQ abdomen  (b)  CA-27-29 is informative  (c)  Repeat PET scan 12/28/2014 shows continuing response ; exam shows resolution of scalp lesions  (d) new skin lesion removed from right anterior chest wall August 2017  (e) status post radiation to the anterior lower right chest wall completed 02/25/2017 (20 sessions)   (f) PET scan 04/04/2016 shows 2 new foci of metabolic activity (T8 and manubrium)  (g) palbociclib added 04/21/2016 at 125 mg 21/7  (h) palbociclib dose dropped to 100 mg 21/7 starting with the March 2018 cycle  (i) bone scan 09/26/2016 is stable  (j) PET scan 02/03/2017 shows her bone metastases, and in addition a 0.5 cm right middle lobe nodule which may be new  (k) MRI of the cervical spine shows collapse of the left lateral mass of C3.  There is no soft tissue mass or focal neural impingement.  (l) palbociclib reduced to 75 mg as of September 2018  (m) palbociclib held during cervical spine irradiation finished in mid February, 2019  (n) letrozole discontinued February 2019 secondary to likely progression   (6) neoplasia associated pain  (a) painful blastic lesion left femoral intertrochanteric region: Status post radiation completed November 2015  (b) pain left knee, status post TKR remotely  (c) left total hip replacement 03/12/2016  (7) palliative radiation to the cervical spine completed 05/08/2017 in [place]  (8) fulvestrant started 05/04/2017  (a) Palbociclib resumed 06/29/2017, currently at 75 mg every other day, 21 days on 7 days off  (9) denosumab/Xgeva started 06/01/2017, repeated every 28 days  (10) follow-up/staging studies  (a) PET scan 08/11/2017 shows no evidence for metabolically active tumor.  Sclerotic bone mets do not show hypermetabolism  (b) PET scan  02/11/2018 shows continuing excellent tumor control  (c) PET scan 08/05/2018 shows same bone lesions but no active disease  (d) PET scan 01/18/2019 finds stable disease  (e) PET scan 01/03/2020 finds no evidence of disease activity.  (11) history of recurrent transient ischemic attacks, most recently June 2022, currently on aspirin and Plavix for prevention, with further neurologic evaluation pending   PLAN: Kandie will soon be 15 years out from initial diagnosis of metastatic breast cancer, with very well-controlled disease.  She is tolerating treatment well and the plan is to continue the Faslodex, palbociclib, and Xgeva as before.  I do not think any of these medications are contributing to her history of transient ischemic attacks, which clearly precedes her cancer diagnosis and its treatment.  She was reluctant to take a full aspirin but I encouraged her to do that together with the Plavix as suggested by neuropathy and encouraged her to keep her appointment with further neurologic evaluation pending in [facility]  Otherwise she received treatment today and she will continue to be treated monthly here and through her primary care physician's office in [place].  She  will see me again in 3 months.  Towards the end of the year she will have a repeat PET scan  Total encounter time 35 minutes.* Total encounter time 35 minutes.Sarajane Jews C. Sargun Rummell, MD 09/13/20 9:30 AM Medical Oncology and Hematology [facility] [place], [place] [address] Tel. [phone]    Fax. [phone]   I, Wilburn Mylar, am acting as scribe for Dr. Virgie Dad. Bradan Congrove.  I, Lurline Del MD, have reviewed the above documentation for accuracy and completeness, and I agree with the above.   *Total Encounter Time as defined by the Centers for Medicare and Medicaid Services includes, in addition to the face-to-face time of a patient visit (documented in the note above)  non-face-to-face time: obtaining and reviewing outside history, ordering and reviewing medications, tests or procedures, care coordination (communications with other health care professionals or caregivers) and documentation in the medical record.

## 2020-09-13 ENCOUNTER — Inpatient Hospital Stay: Payer: Medicare Other

## 2020-09-13 ENCOUNTER — Other Ambulatory Visit: Payer: Self-pay

## 2020-09-13 ENCOUNTER — Telehealth: Payer: Self-pay | Admitting: Pharmacist

## 2020-09-13 ENCOUNTER — Inpatient Hospital Stay: Payer: Medicare Other | Attending: Oncology

## 2020-09-13 ENCOUNTER — Inpatient Hospital Stay (HOSPITAL_BASED_OUTPATIENT_CLINIC_OR_DEPARTMENT_OTHER): Payer: Medicare Other | Admitting: Oncology

## 2020-09-13 VITALS — BP 146/71 | HR 59 | Temp 97.5°F | Resp 18 | Ht 64.0 in | Wt 170.8 lb

## 2020-09-13 DIAGNOSIS — Z170 Estrogen receptor positive status [ER+]: Secondary | ICD-10-CM | POA: Diagnosis not present

## 2020-09-13 DIAGNOSIS — C7951 Secondary malignant neoplasm of bone: Secondary | ICD-10-CM

## 2020-09-13 DIAGNOSIS — C50911 Malignant neoplasm of unspecified site of right female breast: Secondary | ICD-10-CM

## 2020-09-13 DIAGNOSIS — Z5111 Encounter for antineoplastic chemotherapy: Secondary | ICD-10-CM | POA: Diagnosis not present

## 2020-09-13 DIAGNOSIS — C50811 Malignant neoplasm of overlapping sites of right female breast: Secondary | ICD-10-CM

## 2020-09-13 DIAGNOSIS — C50919 Malignant neoplasm of unspecified site of unspecified female breast: Secondary | ICD-10-CM

## 2020-09-13 LAB — CBC WITH DIFFERENTIAL/PLATELET
Abs Immature Granulocytes: 0.01 10*3/uL (ref 0.00–0.07)
Basophils Absolute: 0.1 10*3/uL (ref 0.0–0.1)
Basophils Relative: 2 %
Eosinophils Absolute: 0.1 10*3/uL (ref 0.0–0.5)
Eosinophils Relative: 3 %
HCT: 39.4 % (ref 36.0–46.0)
Hemoglobin: 13.2 g/dL (ref 12.0–15.0)
Immature Granulocytes: 0 %
Lymphocytes Relative: 28 %
Lymphs Abs: 0.7 10*3/uL (ref 0.7–4.0)
MCH: 30.7 pg (ref 26.0–34.0)
MCHC: 33.5 g/dL (ref 30.0–36.0)
MCV: 91.6 fL (ref 80.0–100.0)
Monocytes Absolute: 0.4 10*3/uL (ref 0.1–1.0)
Monocytes Relative: 14 %
Neutro Abs: 1.4 10*3/uL — ABNORMAL LOW (ref 1.7–7.7)
Neutrophils Relative %: 53 %
Platelets: 198 10*3/uL (ref 150–400)
RBC: 4.3 MIL/uL (ref 3.87–5.11)
RDW: 14.6 % (ref 11.5–15.5)
WBC: 2.6 10*3/uL — ABNORMAL LOW (ref 4.0–10.5)
nRBC: 0 % (ref 0.0–0.2)

## 2020-09-13 LAB — COMPREHENSIVE METABOLIC PANEL
ALT: 14 U/L (ref 0–44)
AST: 21 U/L (ref 15–41)
Albumin: 4.3 g/dL (ref 3.5–5.0)
Alkaline Phosphatase: 48 U/L (ref 38–126)
Anion gap: 10 (ref 5–15)
BUN: 14 mg/dL (ref 8–23)
CO2: 25 mmol/L (ref 22–32)
Calcium: 9 mg/dL (ref 8.9–10.3)
Chloride: 102 mmol/L (ref 98–111)
Creatinine, Ser: 0.82 mg/dL (ref 0.44–1.00)
GFR, Estimated: >60 mL/min (ref >60)
Glucose, Bld: 96 mg/dL (ref 70–99)
Potassium: 3.8 mmol/L (ref 3.5–5.1)
Sodium: 137 mmol/L (ref 135–145)
Total Bilirubin: 0.6 mg/dL (ref 0.3–1.2)
Total Protein: 6.7 g/dL (ref 6.5–8.1)

## 2020-09-13 MED ORDER — DENOSUMAB 120 MG/1.7ML ~~LOC~~ SOLN
SUBCUTANEOUS | Status: AC
  Filled 2020-09-13: qty 1.7

## 2020-09-13 MED ORDER — FULVESTRANT 250 MG/5ML IM SOLN
500.0000 mg | Freq: Once | INTRAMUSCULAR | Status: AC
  Administered 2020-09-13: 500 mg via INTRAMUSCULAR

## 2020-09-13 MED ORDER — FULVESTRANT 250 MG/5ML IM SOLN
INTRAMUSCULAR | Status: AC
  Filled 2020-09-13: qty 10

## 2020-09-13 MED ORDER — DENOSUMAB 120 MG/1.7ML ~~LOC~~ SOLN
120.0000 mg | Freq: Once | SUBCUTANEOUS | Status: AC
  Administered 2020-09-13: 120 mg via SUBCUTANEOUS

## 2020-09-13 NOTE — Telephone Encounter (Signed)
Oral Chemotherapy Pharmacist Encounter   Dispensed samples to patient:   Medication: Ibrance 75 mg tablets Instructions: Take 1 tablet by mouth every other day for 21 days on, 7 days off, repeat every 28 days Quantity dispensed: 42 Days supply: [place]: Pfizer Lot: [id number] Exp: 09/2022  Leron Croak, PharmD, BCPS Hematology/Oncology Clinical Pharmacist [facility] [phone] 09/13/2020 10:51 AM

## 2020-09-13 NOTE — Patient Instructions (Signed)
Denosumab injection What is this medication? DENOSUMAB (den oh sue mab) slows bone breakdown. Prolia is used to treat osteoporosis in women after menopause and in men, and in people who are taking corticosteroids for 6 months or more. Delton See is used to treat a high calcium level due to cancer and to prevent bone fractures and other bone problems caused by multiple myeloma or cancer bone metastases. Delton See is also used totreat giant cell tumor of the bone. This medicine may be used for other purposes; ask your health care provider orpharmacist if you have questions. COMMON BRAND NAME(S): Prolia, XGEVA What should I tell my care team before I take this medication? They need to know if you have any of these conditions: dental disease having surgery or tooth extraction infection kidney disease low levels of calcium or Vitamin D in the blood malnutrition on hemodialysis skin conditions or sensitivity thyroid or parathyroid disease an unusual reaction to denosumab, other medicines, foods, dyes, or preservatives pregnant or trying to get pregnant breast-feeding How should I use this medication? This medicine is for injection under the skin. It is given by a health careprofessional in a hospital or clinic setting. A special MedGuide will be given to you before each treatment. Be sure to readthis information carefully each time. For Prolia, talk to your pediatrician regarding the use of this medicine in children. Special care may be needed. For Delton See, talk to your pediatrician regarding the use of this medicine in children. While this drug may be prescribed for children as young as 13 years for selected conditions,precautions do apply. Overdosage: If you think you have taken too much of this medicine contact apoison control center or emergency room at once. NOTE: This medicine is only for you. Do not share this medicine with others. What if I miss a dose? It is important not to miss your dose. Call  your doctor or health careprofessional if you are unable to keep an appointment. What may interact with this medication? Do not take this medicine with any of the following medications: other medicines containing denosumab This medicine may also interact with the following medications: medicines that lower your chance of fighting infection steroid medicines like prednisone or cortisone This list may not describe all possible interactions. Give your health care provider a list of all the medicines, herbs, non-prescription drugs, or dietary supplements you use. Also tell them if you smoke, drink alcohol, or use illegaldrugs. Some items may interact with your medicine. What should I watch for while using this medication? Visit your doctor or health care professional for regular checks on your progress. Your doctor or health care professional may order blood tests andother tests to see how you are doing. Call your doctor or health care professional for advice if you get a fever, chills or sore throat, or other symptoms of a cold or flu. Do not treat yourself. This drug may decrease your body's ability to fight infection. Try toavoid being around people who are sick. You should make sure you get enough calcium and vitamin D while you are taking this medicine, unless your doctor tells you not to. Discuss the foods you eatand the vitamins you take with your health care professional. See your dentist regularly. Brush and floss your teeth as directed. Before youhave any dental work done, tell your dentist you are receiving this medicine. Do not become pregnant while taking this medicine or for 5 months after stopping it. Talk with your doctor or health care professional about your  birth control options while taking this medicine. Women should inform their doctor if they wish to become pregnant or think they might be pregnant. There is a potential for serious side effects to an unborn child. Talk to your health  careprofessional or pharmacist for more information. What side effects may I notice from receiving this medication? Side effects that you should report to your doctor or health care professionalas soon as possible: allergic reactions like skin rash, itching or hives, swelling of the face, lips, or tongue bone pain breathing problems dizziness jaw pain, especially after dental work redness, blistering, peeling of the skin signs and symptoms of infection like fever or chills; cough; sore throat; pain or trouble passing urine signs of low calcium like fast heartbeat, muscle cramps or muscle pain; pain, tingling, numbness in the hands or feet; seizures unusual bleeding or bruising unusually weak or tired Side effects that usually do not require medical attention (report to yourdoctor or health care professional if they continue or are bothersome): constipation diarrhea headache joint pain loss of appetite muscle pain runny nose tiredness upset stomach This list may not describe all possible side effects. Call your doctor for medical advice about side effects. You may report side effects to FDA at1-800-FDA-1088. Where should I keep my medication? This medicine is only given in a clinic, doctor's office, or other health caresetting and will not be stored at home. NOTE: This sheet is a summary. It may not cover all possible information. If you have questions about this medicine, talk to your doctor, pharmacist, orhealth care provider.  2022 [employer] Standard (2017-07-17 16:10:44) Fulvestrant injection What is this medication? FULVESTRANT (ful VES trant) blocks the effects of estrogen. It is used to treatbreast cancer. This medicine may be used for other purposes; ask your health care provider orpharmacist if you have questions. COMMON BRAND NAME(S): FASLODEX What should I tell my care team before I take this medication? They need to know if you have any of these conditions: bleeding  disorders liver disease low blood counts, like low white cell, platelet, or red cell counts an unusual or allergic reaction to fulvestrant, other medicines, foods, dyes, or preservatives pregnant or trying to get pregnant breast-feeding How should I use this medication? This medicine is for injection into a muscle. It is usually given by a healthcare professional in a hospital or clinic setting. Talk to your pediatrician regarding the use of this medicine in children.Special care may be needed. Overdosage: If you think you have taken too much of this medicine contact apoison control center or emergency room at once. NOTE: This medicine is only for you. Do not share this medicine with others. What if I miss a dose? It is important not to miss your dose. Call your doctor or health careprofessional if you are unable to keep an appointment. What may interact with this medication? medicines that treat or prevent blood clots like warfarin, enoxaparin, dalteparin, apixaban, dabigatran, and rivaroxaban This list may not describe all possible interactions. Give your health care provider a list of all the medicines, herbs, non-prescription drugs, or dietary supplements you use. Also tell them if you smoke, drink alcohol, or use illegaldrugs. Some items may interact with your medicine. What should I watch for while using this medication? Your condition will be monitored carefully while you are receiving this medicine. You will need important blood work done while you are taking thismedicine. Do not become pregnant while taking this medicine or for at least 1 year after stopping   it. Women of child-bearing potential will need to have a negative pregnancy test before starting this medicine. Women should inform their doctor if they wish to become pregnant or think they might be pregnant. There is a potential for serious side effects to an unborn child. Men should inform their doctors if they wish to father a  child. This medicine may lower sperm counts. Talk to your health care professional or pharmacist for more information. Do not breast-feed an infant while taking this medicine or for 1 year after thelast dose. What side effects may I notice from receiving this medication? Side effects that you should report to your doctor or health care professionalas soon as possible: allergic reactions like skin rash, itching or hives, swelling of the face, lips, or tongue feeling faint or lightheaded, falls pain, tingling, numbness, or weakness in the legs signs and symptoms of infection like fever or chills; cough; flu-like symptoms; sore throat vaginal bleeding Side effects that usually do not require medical attention (report to yourdoctor or health care professional if they continue or are bothersome): aches, pains constipation diarrhea headache hot flashes nausea, vomiting pain at site where injected stomach pain This list may not describe all possible side effects. Call your doctor for medical advice about side effects. You may report side effects to FDA at1-800-FDA-1088. Where should I keep my medication? This drug is given in a hospital or clinic and will not be stored at home. NOTE: This sheet is a summary. It may not cover all possible information. If you have questions about this medicine, talk to your doctor, pharmacist, orhealth care provider.  2022 [employer]/Gold Standard (2017-06-18 11:34:41)  

## 2020-09-14 LAB — CANCER ANTIGEN 27.29: CA 27.29: 28.2 U/mL (ref 0.0–38.6)

## 2020-09-20 DIAGNOSIS — E785 Hyperlipidemia, unspecified: Secondary | ICD-10-CM | POA: Diagnosis not present

## 2020-09-20 DIAGNOSIS — K219 Gastro-esophageal reflux disease without esophagitis: Secondary | ICD-10-CM | POA: Diagnosis not present

## 2020-09-20 DIAGNOSIS — I10 Essential (primary) hypertension: Secondary | ICD-10-CM | POA: Diagnosis not present

## 2020-09-20 DIAGNOSIS — E039 Hypothyroidism, unspecified: Secondary | ICD-10-CM | POA: Diagnosis not present

## 2020-09-25 DIAGNOSIS — I672 Cerebral atherosclerosis: Secondary | ICD-10-CM | POA: Diagnosis not present

## 2020-09-25 DIAGNOSIS — I10 Essential (primary) hypertension: Secondary | ICD-10-CM | POA: Diagnosis not present

## 2020-09-25 DIAGNOSIS — E039 Hypothyroidism, unspecified: Secondary | ICD-10-CM | POA: Diagnosis not present

## 2020-09-25 DIAGNOSIS — Z0000 Encounter for general adult medical examination without abnormal findings: Secondary | ICD-10-CM | POA: Diagnosis not present

## 2020-09-25 DIAGNOSIS — E782 Mixed hyperlipidemia: Secondary | ICD-10-CM | POA: Diagnosis not present

## 2020-10-04 DIAGNOSIS — M778 Other enthesopathies, not elsewhere classified: Secondary | ICD-10-CM | POA: Diagnosis not present

## 2020-10-04 DIAGNOSIS — M19072 Primary osteoarthritis, left ankle and foot: Secondary | ICD-10-CM | POA: Diagnosis not present

## 2020-10-09 ENCOUNTER — Inpatient Hospital Stay: Payer: Medicare Other | Attending: Oncology

## 2020-10-09 ENCOUNTER — Encounter: Payer: Self-pay | Admitting: Hematology and Oncology

## 2020-10-09 ENCOUNTER — Other Ambulatory Visit: Payer: Self-pay

## 2020-10-09 DIAGNOSIS — C7951 Secondary malignant neoplasm of bone: Secondary | ICD-10-CM | POA: Diagnosis not present

## 2020-10-09 DIAGNOSIS — C50811 Malignant neoplasm of overlapping sites of right female breast: Secondary | ICD-10-CM | POA: Insufficient documentation

## 2020-10-09 DIAGNOSIS — Z170 Estrogen receptor positive status [ER+]: Secondary | ICD-10-CM | POA: Insufficient documentation

## 2020-10-09 DIAGNOSIS — C50919 Malignant neoplasm of unspecified site of unspecified female breast: Secondary | ICD-10-CM

## 2020-10-09 DIAGNOSIS — Z5111 Encounter for antineoplastic chemotherapy: Secondary | ICD-10-CM | POA: Insufficient documentation

## 2020-10-09 LAB — CBC: RBC: 4.3 (ref 3.87–5.11)

## 2020-10-09 LAB — CBC AND DIFFERENTIAL
HCT: 39 (ref 36–46)
Hemoglobin: 13.2 (ref 12.0–16.0)
Neutrophils Absolute: 2.01
Platelets: 194 (ref 150–399)
WBC: 3.3

## 2020-10-09 LAB — BASIC METABOLIC PANEL
BUN: 17 (ref 4–21)
CO2: 24 — AB (ref 13–22)
Chloride: 100 (ref 99–108)
Creatinine: 0.8 (ref 0.5–1.1)
Glucose: 126
Potassium: 4.3 (ref 3.4–5.3)
Sodium: 133 — AB (ref 137–147)

## 2020-10-09 LAB — HEPATIC FUNCTION PANEL
ALT: 18 (ref 7–35)
AST: 41 — AB (ref 13–35)
Alkaline Phosphatase: 51 (ref 25–125)
Bilirubin, Total: 0.5

## 2020-10-09 LAB — COMPREHENSIVE METABOLIC PANEL
Albumin: 4.4 (ref 3.5–5.0)
Calcium: 9 (ref 8.7–10.7)

## 2020-10-11 ENCOUNTER — Other Ambulatory Visit: Payer: Self-pay

## 2020-10-11 ENCOUNTER — Inpatient Hospital Stay: Payer: Medicare Other

## 2020-10-11 VITALS — BP 153/66 | HR 63 | Temp 98.7°F | Resp 18 | Ht 64.0 in | Wt 168.8 lb

## 2020-10-11 DIAGNOSIS — Z170 Estrogen receptor positive status [ER+]: Secondary | ICD-10-CM | POA: Diagnosis not present

## 2020-10-11 DIAGNOSIS — Z5111 Encounter for antineoplastic chemotherapy: Secondary | ICD-10-CM | POA: Diagnosis not present

## 2020-10-11 DIAGNOSIS — C50811 Malignant neoplasm of overlapping sites of right female breast: Secondary | ICD-10-CM | POA: Diagnosis not present

## 2020-10-11 DIAGNOSIS — C7951 Secondary malignant neoplasm of bone: Secondary | ICD-10-CM | POA: Diagnosis not present

## 2020-10-11 DIAGNOSIS — E039 Hypothyroidism, unspecified: Secondary | ICD-10-CM | POA: Diagnosis not present

## 2020-10-11 DIAGNOSIS — C50919 Malignant neoplasm of unspecified site of unspecified female breast: Secondary | ICD-10-CM

## 2020-10-11 MED ORDER — FULVESTRANT 250 MG/5ML IM SOLN
INTRAMUSCULAR | Status: AC
  Filled 2020-10-11: qty 10

## 2020-10-11 MED ORDER — DENOSUMAB 120 MG/1.7ML ~~LOC~~ SOLN
SUBCUTANEOUS | Status: AC
  Filled 2020-10-11: qty 1.7

## 2020-10-11 MED ORDER — DENOSUMAB 120 MG/1.7ML ~~LOC~~ SOLN
120.0000 mg | Freq: Once | SUBCUTANEOUS | Status: AC
Start: 2020-10-11 — End: 2020-10-11
  Administered 2020-10-11: 120 mg via SUBCUTANEOUS

## 2020-10-11 MED ORDER — FULVESTRANT 250 MG/5ML IM SOLN
500.0000 mg | Freq: Once | INTRAMUSCULAR | Status: AC
Start: 2020-10-11 — End: 2020-10-11
  Administered 2020-10-11: 500 mg via INTRAMUSCULAR

## 2020-10-11 NOTE — Patient Instructions (Signed)
Fulvestrant injection What is this medication? FULVESTRANT (ful VES trant) blocks the effects of estrogen. It is used to treat breast cancer. This medicine may be used for other purposes; ask your health care provider or pharmacist if you have questions. COMMON BRAND NAME(S): FASLODEX What should I tell my care team before I take this medication? They need to know if you have any of these conditions: bleeding disorders liver disease low blood counts, like low white cell, platelet, or red cell counts an unusual or allergic reaction to fulvestrant, other medicines, foods, dyes, or preservatives pregnant or trying to get pregnant breast-feeding How should I use this medication? This medicine is for injection into a muscle. It is usually given by a health care professional in a hospital or clinic setting. Talk to your pediatrician regarding the use of this medicine in children. Special care may be needed. Overdosage: If you think you have taken too much of this medicine contact a poison control center or emergency room at once. NOTE: This medicine is only for you. Do not share this medicine with others. What if I miss a dose? It is important not to miss your dose. Call your doctor or health care professional if you are unable to keep an appointment. What may interact with this medication? medicines that treat or prevent blood clots like warfarin, enoxaparin, dalteparin, apixaban, dabigatran, and rivaroxaban This list may not describe all possible interactions. Give your health care provider a list of all the medicines, herbs, non-prescription drugs, or dietary supplements you use. Also tell them if you smoke, drink alcohol, or use illegal drugs. Some items may interact with your medicine. What should I watch for while using this medication? Your condition will be monitored carefully while you are receiving this medicine. You will need important blood work done while you are taking this  medicine. Do not become pregnant while taking this medicine or for at least 1 year after stopping it. Women of child-bearing potential will need to have a negative pregnancy test before starting this medicine. Women should inform their doctor if they wish to become pregnant or think they might be pregnant. There is a potential for serious side effects to an unborn child. Men should inform their doctors if they wish to father a child. This medicine may lower sperm counts. Talk to your health care professional or pharmacist for more information. Do not breast-feed an infant while taking this medicine or for 1 year after the last dose. What side effects may I notice from receiving this medication? Side effects that you should report to your doctor or health care professional as soon as possible: allergic reactions like skin rash, itching or hives, swelling of the face, lips, or tongue feeling faint or lightheaded, falls pain, tingling, numbness, or weakness in the legs signs and symptoms of infection like fever or chills; cough; flu-like symptoms; sore throat vaginal bleeding Side effects that usually do not require medical attention (report to your doctor or health care professional if they continue or are bothersome): aches, pains constipation diarrhea headache hot flashes nausea, vomiting pain at site where injected stomach pain This list may not describe all possible side effects. Call your doctor for medical advice about side effects. You may report side effects to FDA at 1-800-FDA-1088. Where should I keep my medication? This drug is given in a hospital or clinic and will not be stored at home. NOTE: This sheet is a summary. It may not cover all possible information. If you have   questions about this medicine, talk to your doctor, pharmacist, or health care provider.  2022 [employer] Standard (2017-06-18 11:34:41) Denosumab injection What is this medication? DENOSUMAB (den oh sue mab)  slows bone breakdown. Prolia is used to treat osteoporosis in women after menopause and in men, and in people who are taking corticosteroids for 6 months or more. Xgeva is used to treat a high calcium level due to cancer and to prevent bone fractures and other bone problems caused by multiple myeloma or cancer bone metastases. Xgeva is also used to treat giant cell tumor of the bone. This medicine may be used for other purposes; ask your health care provider or pharmacist if you have questions. COMMON BRAND NAME(S): Prolia, XGEVA What should I tell my care team before I take this medication? They need to know if you have any of these conditions: dental disease having surgery or tooth extraction infection kidney disease low levels of calcium or Vitamin D in the blood malnutrition on hemodialysis skin conditions or sensitivity thyroid or parathyroid disease an unusual reaction to denosumab, other medicines, foods, dyes, or preservatives pregnant or trying to get pregnant breast-feeding How should I use this medication? This medicine is for injection under the skin. It is given by a health care professional in a hospital or clinic setting. A special MedGuide will be given to you before each treatment. Be sure to read this information carefully each time. For Prolia, talk to your pediatrician regarding the use of this medicine in children. Special care may be needed. For Xgeva, talk to your pediatrician regarding the use of this medicine in children. While this drug may be prescribed for children as young as 13 years for selected conditions, precautions do apply. Overdosage: If you think you have taken too much of this medicine contact a poison control center or emergency room at once. NOTE: This medicine is only for you. Do not share this medicine with others. What if I miss a dose? It is important not to miss your dose. Call your doctor or health care professional if you are unable to keep an  appointment. What may interact with this medication? Do not take this medicine with any of the following medications: other medicines containing denosumab This medicine may also interact with the following medications: medicines that lower your chance of fighting infection steroid medicines like prednisone or cortisone This list may not describe all possible interactions. Give your health care provider a list of all the medicines, herbs, non-prescription drugs, or dietary supplements you use. Also tell them if you smoke, drink alcohol, or use illegal drugs. Some items may interact with your medicine. What should I watch for while using this medication? Visit your doctor or health care professional for regular checks on your progress. Your doctor or health care professional may order blood tests and other tests to see how you are doing. Call your doctor or health care professional for advice if you get a fever, chills or sore throat, or other symptoms of a cold or flu. Do not treat yourself. This drug may decrease your body's ability to fight infection. Try to avoid being around people who are sick. You should make sure you get enough calcium and vitamin D while you are taking this medicine, unless your doctor tells you not to. Discuss the foods you eat and the vitamins you take with your health care professional. See your dentist regularly. Brush and floss your teeth as directed. Before you have any dental work done, tell   your dentist you are receiving this medicine. Do not become pregnant while taking this medicine or for 5 months after stopping it. Talk with your doctor or health care professional about your birth control options while taking this medicine. Women should inform their doctor if they wish to become pregnant or think they might be pregnant. There is a potential for serious side effects to an unborn child. Talk to your health care professional or pharmacist for more information. What side  effects may I notice from receiving this medication? Side effects that you should report to your doctor or health care professional as soon as possible: allergic reactions like skin rash, itching or hives, swelling of the face, lips, or tongue bone pain breathing problems dizziness jaw pain, especially after dental work redness, blistering, peeling of the skin signs and symptoms of infection like fever or chills; cough; sore throat; pain or trouble passing urine signs of low calcium like fast heartbeat, muscle cramps or muscle pain; pain, tingling, numbness in the hands or feet; seizures unusual bleeding or bruising unusually weak or tired Side effects that usually do not require medical attention (report to your doctor or health care professional if they continue or are bothersome): constipation diarrhea headache joint pain loss of appetite muscle pain runny nose tiredness upset stomach This list may not describe all possible side effects. Call your doctor for medical advice about side effects. You may report side effects to FDA at 1-800-FDA-1088. Where should I keep my medication? This medicine is only given in a clinic, doctor's office, or other health care setting and will not be stored at home. NOTE: This sheet is a summary. It may not cover all possible information. If you have questions about this medicine, talk to your doctor, pharmacist, or health care provider.  2022 [employer] Standard (2017-07-17 16:10:44)  

## 2020-10-21 DIAGNOSIS — I10 Essential (primary) hypertension: Secondary | ICD-10-CM | POA: Diagnosis not present

## 2020-10-21 DIAGNOSIS — E039 Hypothyroidism, unspecified: Secondary | ICD-10-CM | POA: Diagnosis not present

## 2020-10-21 DIAGNOSIS — K219 Gastro-esophageal reflux disease without esophagitis: Secondary | ICD-10-CM | POA: Diagnosis not present

## 2020-10-21 DIAGNOSIS — E785 Hyperlipidemia, unspecified: Secondary | ICD-10-CM | POA: Diagnosis not present

## 2020-10-24 DIAGNOSIS — D225 Melanocytic nevi of trunk: Secondary | ICD-10-CM | POA: Diagnosis not present

## 2020-10-24 DIAGNOSIS — L821 Other seborrheic keratosis: Secondary | ICD-10-CM | POA: Diagnosis not present

## 2020-10-24 DIAGNOSIS — D485 Neoplasm of uncertain behavior of skin: Secondary | ICD-10-CM | POA: Diagnosis not present

## 2020-10-24 DIAGNOSIS — L814 Other melanin hyperpigmentation: Secondary | ICD-10-CM | POA: Diagnosis not present

## 2020-10-24 DIAGNOSIS — D2239 Melanocytic nevi of other parts of face: Secondary | ICD-10-CM | POA: Diagnosis not present

## 2020-10-24 DIAGNOSIS — L570 Actinic keratosis: Secondary | ICD-10-CM | POA: Diagnosis not present

## 2020-10-29 DIAGNOSIS — E538 Deficiency of other specified B group vitamins: Secondary | ICD-10-CM | POA: Diagnosis not present

## 2020-11-06 ENCOUNTER — Other Ambulatory Visit: Payer: Self-pay

## 2020-11-06 ENCOUNTER — Encounter: Payer: Self-pay | Admitting: Hematology and Oncology

## 2020-11-06 ENCOUNTER — Telehealth: Payer: Self-pay | Admitting: Oncology

## 2020-11-06 ENCOUNTER — Inpatient Hospital Stay: Payer: Medicare Other | Attending: Oncology

## 2020-11-06 DIAGNOSIS — C7951 Secondary malignant neoplasm of bone: Secondary | ICD-10-CM | POA: Insufficient documentation

## 2020-11-06 DIAGNOSIS — Z170 Estrogen receptor positive status [ER+]: Secondary | ICD-10-CM | POA: Insufficient documentation

## 2020-11-06 DIAGNOSIS — C50811 Malignant neoplasm of overlapping sites of right female breast: Secondary | ICD-10-CM | POA: Insufficient documentation

## 2020-11-06 DIAGNOSIS — C50919 Malignant neoplasm of unspecified site of unspecified female breast: Secondary | ICD-10-CM

## 2020-11-06 DIAGNOSIS — Z5111 Encounter for antineoplastic chemotherapy: Secondary | ICD-10-CM | POA: Insufficient documentation

## 2020-11-06 LAB — BASIC METABOLIC PANEL
BUN: 14 (ref 4–21)
CO2: 19 (ref 13–22)
Chloride: 104 (ref 99–108)
Creatinine: 0.8 (ref 0.5–1.1)
Glucose: 123
Potassium: 4.5 (ref 3.4–5.3)
Sodium: 134 — AB (ref 137–147)

## 2020-11-06 LAB — CBC AND DIFFERENTIAL
HCT: 37 (ref 36–46)
Hemoglobin: 12.3 (ref 12.0–16.0)
Neutrophils Absolute: 1.25
Platelets: 208 (ref 150–399)
WBC: 2.5

## 2020-11-06 LAB — COMPREHENSIVE METABOLIC PANEL
Albumin: 4.1 (ref 3.5–5.0)
Calcium: 8.7 (ref 8.7–10.7)

## 2020-11-06 LAB — HEPATIC FUNCTION PANEL
ALT: 14 (ref 7–35)
AST: 34 (ref 13–35)
Alkaline Phosphatase: 60 (ref 25–125)
Bilirubin, Total: 0.5

## 2020-11-06 LAB — CBC: RBC: 4.06 (ref 3.87–5.11)

## 2020-11-06 NOTE — Telephone Encounter (Signed)
Per 8/16 LOS next appt scheduled and confirmed with patient

## 2020-11-08 ENCOUNTER — Other Ambulatory Visit: Payer: Self-pay

## 2020-11-08 ENCOUNTER — Inpatient Hospital Stay: Payer: Medicare Other

## 2020-11-08 VITALS — BP 137/77 | HR 64 | Temp 98.6°F | Resp 18 | Ht 64.0 in | Wt 173.0 lb

## 2020-11-08 DIAGNOSIS — Z170 Estrogen receptor positive status [ER+]: Secondary | ICD-10-CM | POA: Diagnosis not present

## 2020-11-08 DIAGNOSIS — Z5111 Encounter for antineoplastic chemotherapy: Secondary | ICD-10-CM | POA: Diagnosis not present

## 2020-11-08 DIAGNOSIS — C7951 Secondary malignant neoplasm of bone: Secondary | ICD-10-CM | POA: Diagnosis not present

## 2020-11-08 DIAGNOSIS — C50811 Malignant neoplasm of overlapping sites of right female breast: Secondary | ICD-10-CM | POA: Diagnosis not present

## 2020-11-08 DIAGNOSIS — C50919 Malignant neoplasm of unspecified site of unspecified female breast: Secondary | ICD-10-CM

## 2020-11-08 MED ORDER — FULVESTRANT 250 MG/5ML IM SOLN
500.0000 mg | Freq: Once | INTRAMUSCULAR | Status: AC
  Administered 2020-11-08: 500 mg via INTRAMUSCULAR
  Filled 2020-11-08: qty 10

## 2020-11-08 MED ORDER — DENOSUMAB 120 MG/1.7ML ~~LOC~~ SOLN
120.0000 mg | Freq: Once | SUBCUTANEOUS | Status: AC
  Administered 2020-11-08: 120 mg via SUBCUTANEOUS
  Filled 2020-11-08: qty 1.7

## 2020-11-08 NOTE — Patient Instructions (Signed)
Fulvestrant injection What is this medication? FULVESTRANT (ful VES trant) blocks the effects of estrogen. It is used to treat breast cancer. This medicine may be used for other purposes; ask your health care provider or pharmacist if you have questions. COMMON BRAND NAME(S): FASLODEX What should I tell my care team before I take this medication? They need to know if you have any of these conditions: bleeding disorders liver disease low blood counts, like low white cell, platelet, or red cell counts an unusual or allergic reaction to fulvestrant, other medicines, foods, dyes, or preservatives pregnant or trying to get pregnant breast-feeding How should I use this medication? This medicine is for injection into a muscle. It is usually given by a health care professional in a hospital or clinic setting. Talk to your pediatrician regarding the use of this medicine in children. Special care may be needed. Overdosage: If you think you have taken too much of this medicine contact a poison control center or emergency room at once. NOTE: This medicine is only for you. Do not share this medicine with others. What if I miss a dose? It is important not to miss your dose. Call your doctor or health care professional if you are unable to keep an appointment. What may interact with this medication? medicines that treat or prevent blood clots like warfarin, enoxaparin, dalteparin, apixaban, dabigatran, and rivaroxaban This list may not describe all possible interactions. Give your health care provider a list of all the medicines, herbs, non-prescription drugs, or dietary supplements you use. Also tell them if you smoke, drink alcohol, or use illegal drugs. Some items may interact with your medicine. What should I watch for while using this medication? Your condition will be monitored carefully while you are receiving this medicine. You will need important blood work done while you are taking this  medicine. Do not become pregnant while taking this medicine or for at least 1 year after stopping it. Women of child-bearing potential will need to have a negative pregnancy test before starting this medicine. Women should inform their doctor if they wish to become pregnant or think they might be pregnant. There is a potential for serious side effects to an unborn child. Men should inform their doctors if they wish to father a child. This medicine may lower sperm counts. Talk to your health care professional or pharmacist for more information. Do not breast-feed an infant while taking this medicine or for 1 year after the last dose. What side effects may I notice from receiving this medication? Side effects that you should report to your doctor or health care professional as soon as possible: allergic reactions like skin rash, itching or hives, swelling of the face, lips, or tongue feeling faint or lightheaded, falls pain, tingling, numbness, or weakness in the legs signs and symptoms of infection like fever or chills; cough; flu-like symptoms; sore throat vaginal bleeding Side effects that usually do not require medical attention (report to your doctor or health care professional if they continue or are bothersome): aches, pains constipation diarrhea headache hot flashes nausea, vomiting pain at site where injected stomach pain This list may not describe all possible side effects. Call your doctor for medical advice about side effects. You may report side effects to FDA at 1-800-FDA-1088. Where should I keep my medication? This drug is given in a hospital or clinic and will not be stored at home. NOTE: This sheet is a summary. It may not cover all possible information. If you have   questions about this medicine, talk to your doctor, pharmacist, or health care provider.  2022 [employer] Standard (2017-06-18 11:34:41) Denosumab injection What is this medication? DENOSUMAB (den oh sue mab)  slows bone breakdown. Prolia is used to treat osteoporosis in women after menopause and in men, and in people who are taking corticosteroids for 6 months or more. Xgeva is used to treat a high calcium level due to cancer and to prevent bone fractures and other bone problems caused by multiple myeloma or cancer bone metastases. Xgeva is also used to treat giant cell tumor of the bone. This medicine may be used for other purposes; ask your health care provider or pharmacist if you have questions. COMMON BRAND NAME(S): Prolia, XGEVA What should I tell my care team before I take this medication? They need to know if you have any of these conditions: dental disease having surgery or tooth extraction infection kidney disease low levels of calcium or Vitamin D in the blood malnutrition on hemodialysis skin conditions or sensitivity thyroid or parathyroid disease an unusual reaction to denosumab, other medicines, foods, dyes, or preservatives pregnant or trying to get pregnant breast-feeding How should I use this medication? This medicine is for injection under the skin. It is given by a health care professional in a hospital or clinic setting. A special MedGuide will be given to you before each treatment. Be sure to read this information carefully each time. For Prolia, talk to your pediatrician regarding the use of this medicine in children. Special care may be needed. For Xgeva, talk to your pediatrician regarding the use of this medicine in children. While this drug may be prescribed for children as young as 13 years for selected conditions, precautions do apply. Overdosage: If you think you have taken too much of this medicine contact a poison control center or emergency room at once. NOTE: This medicine is only for you. Do not share this medicine with others. What if I miss a dose? It is important not to miss your dose. Call your doctor or health care professional if you are unable to keep an  appointment. What may interact with this medication? Do not take this medicine with any of the following medications: other medicines containing denosumab This medicine may also interact with the following medications: medicines that lower your chance of fighting infection steroid medicines like prednisone or cortisone This list may not describe all possible interactions. Give your health care provider a list of all the medicines, herbs, non-prescription drugs, or dietary supplements you use. Also tell them if you smoke, drink alcohol, or use illegal drugs. Some items may interact with your medicine. What should I watch for while using this medication? Visit your doctor or health care professional for regular checks on your progress. Your doctor or health care professional may order blood tests and other tests to see how you are doing. Call your doctor or health care professional for advice if you get a fever, chills or sore throat, or other symptoms of a cold or flu. Do not treat yourself. This drug may decrease your body's ability to fight infection. Try to avoid being around people who are sick. You should make sure you get enough calcium and vitamin D while you are taking this medicine, unless your doctor tells you not to. Discuss the foods you eat and the vitamins you take with your health care professional. See your dentist regularly. Brush and floss your teeth as directed. Before you have any dental work done, tell   your dentist you are receiving this medicine. Do not become pregnant while taking this medicine or for 5 months after stopping it. Talk with your doctor or health care professional about your birth control options while taking this medicine. Women should inform their doctor if they wish to become pregnant or think they might be pregnant. There is a potential for serious side effects to an unborn child. Talk to your health care professional or pharmacist for more information. What side  effects may I notice from receiving this medication? Side effects that you should report to your doctor or health care professional as soon as possible: allergic reactions like skin rash, itching or hives, swelling of the face, lips, or tongue bone pain breathing problems dizziness jaw pain, especially after dental work redness, blistering, peeling of the skin signs and symptoms of infection like fever or chills; cough; sore throat; pain or trouble passing urine signs of low calcium like fast heartbeat, muscle cramps or muscle pain; pain, tingling, numbness in the hands or feet; seizures unusual bleeding or bruising unusually weak or tired Side effects that usually do not require medical attention (report to your doctor or health care professional if they continue or are bothersome): constipation diarrhea headache joint pain loss of appetite muscle pain runny nose tiredness upset stomach This list may not describe all possible side effects. Call your doctor for medical advice about side effects. You may report side effects to FDA at 1-800-FDA-1088. Where should I keep my medication? This medicine is only given in a clinic, doctor's office, or other health care setting and will not be stored at home. NOTE: This sheet is a summary. It may not cover all possible information. If you have questions about this medicine, talk to your doctor, pharmacist, or health care provider.  2022 [employer] Standard (2017-07-17 16:10:44)  

## 2020-11-16 DIAGNOSIS — M1711 Unilateral primary osteoarthritis, right knee: Secondary | ICD-10-CM | POA: Diagnosis not present

## 2020-11-16 DIAGNOSIS — M25461 Effusion, right knee: Secondary | ICD-10-CM | POA: Diagnosis not present

## 2020-11-16 DIAGNOSIS — M25561 Pain in right knee: Secondary | ICD-10-CM | POA: Diagnosis not present

## 2020-11-21 DIAGNOSIS — I10 Essential (primary) hypertension: Secondary | ICD-10-CM | POA: Diagnosis not present

## 2020-11-21 DIAGNOSIS — J320 Chronic maxillary sinusitis: Secondary | ICD-10-CM | POA: Diagnosis not present

## 2020-11-21 DIAGNOSIS — E785 Hyperlipidemia, unspecified: Secondary | ICD-10-CM | POA: Diagnosis not present

## 2020-11-21 DIAGNOSIS — E039 Hypothyroidism, unspecified: Secondary | ICD-10-CM | POA: Diagnosis not present

## 2020-11-21 DIAGNOSIS — Z6830 Body mass index (BMI) 30.0-30.9, adult: Secondary | ICD-10-CM | POA: Diagnosis not present

## 2020-11-21 DIAGNOSIS — E559 Vitamin D deficiency, unspecified: Secondary | ICD-10-CM | POA: Diagnosis not present

## 2020-11-21 DIAGNOSIS — K219 Gastro-esophageal reflux disease without esophagitis: Secondary | ICD-10-CM | POA: Diagnosis not present

## 2020-11-22 DIAGNOSIS — L600 Ingrowing nail: Secondary | ICD-10-CM | POA: Diagnosis not present

## 2020-11-28 ENCOUNTER — Other Ambulatory Visit (HOSPITAL_COMMUNITY): Payer: Self-pay

## 2020-11-28 ENCOUNTER — Encounter: Payer: Self-pay | Admitting: Oncology

## 2020-11-29 DIAGNOSIS — E538 Deficiency of other specified B group vitamins: Secondary | ICD-10-CM | POA: Diagnosis not present

## 2020-12-04 DIAGNOSIS — C44311 Basal cell carcinoma of skin of nose: Secondary | ICD-10-CM | POA: Diagnosis not present

## 2020-12-05 DIAGNOSIS — M4722 Other spondylosis with radiculopathy, cervical region: Secondary | ICD-10-CM | POA: Diagnosis not present

## 2020-12-05 DIAGNOSIS — R030 Elevated blood-pressure reading, without diagnosis of hypertension: Secondary | ICD-10-CM | POA: Diagnosis not present

## 2020-12-05 DIAGNOSIS — Z6830 Body mass index (BMI) 30.0-30.9, adult: Secondary | ICD-10-CM | POA: Diagnosis not present

## 2020-12-05 DIAGNOSIS — M47892 Other spondylosis, cervical region: Secondary | ICD-10-CM | POA: Diagnosis not present

## 2020-12-05 NOTE — Progress Notes (Signed)
[place]  [phone] [phone]   ID: Jade Mooney DOB: 11-28-1945  MR#: [id number]  UUV#:[id number]  Patient Care Team: Street, Jade Mt, MD as PCP - General (Family Medicine) Jade Essex, MD as Consulting Physician (Gastroenterology) Jade Mooney, Jade Dad, MD as Consulting Physician (Oncology) Jade Arabian, MD as Consulting Physician (Orthopedic Surgery) Jade Mayer, MD as Consulting Physician (Radiation Oncology) Jade Miss, MD as Consulting Physician (Neurosurgery) Jade Kaplan, MD as Consulting Physician (Oncology)   CHIEF COMPLAINT: metastatic breast cancer (s/p right mastectomy)  CURRENT TREATMENT: fulvestrant, denosumab/Xgeva, palbociclib   INTERVAL HISTORY: Jade Mooney returns today for follow-up and treatment of her estrogen receptor positive breast cancer.  Her husband Jade Mooney was not with her today.  She continues on palbociclib/Ibrance at 75 mg every other day, 21 days on 7 days off.  She tolerates this with no side effects that she is aware of except some mornings after taking the Ibrance with breakfast she feels a little nauseated.  Somehow taking a lemon drop takes care of that problem.  Her ANC today remains acceptable.  She also continues on fulvestrant, which she receives today and every 28 days.  Aside from the discomfort of the administration (which can last up to a week) she tolerates this well.    Finally, she also continues on denosumab/Xgeva.  She receives that also every 28 days and again 2 out of every 3 doses she receives in [place] and every third dose including today's she receives it here.  There have been no dental issues to date--she saw her dentist earlier this month..  We are following the CA 27-29: Lab Results  Component Value Date   CA2729 28.2 09/13/2020   CA2729 28.5 06/21/2020   CA2729 28.5 05/22/2020   CA2729 30.8 03/29/2020   CA2729 25.1 02/28/2020   Her most recent systemic  reevaluation was a PET scan 01/03/2020 which showed no areas of pathologically increased metabolism.  REVIEW OF SYSTEMS: Jade Mooney tells me she is having problems with her right knee (recall she is status post left knee replacement).  She had to have some fluid drawn and this is keeping her from exercising regularly.  She and Jade Mooney her husband had COVID August 2022.  She had a fever cough, low energy and alteration in taste.  Those things likely have resolved.  She saw Dr. Ellene Mooney recently and he thinks there are problems in C4-5 and an MRI is pending.  She also was evaluated by her dermatologist in [place] Dr. Erasmo Score and as she had some nasal surgery which went well.  Aside from these issues a detailed review of systems today was stable   COVID 19 VACCINATION STATUS: Status post Pfizer x4; status post COVID infection 11/11/2018   BREAST CANCER HISTORY: From the earlier summary notes:  Jade Mooney was referred to Dr. Clarene Mooney for evaluation of abdominal discomfort and nausea. Exam and blood work including liver function tests was unremarkable aside from normochromic normocytic anemia. Cholecystitis was suspected and the patient underwent endoscopy followed by abdominal/ pelvic CT scan on 09/29/2013. This showed showed a normal gallbladder. Incidental findings included multiple simple cysts in the liver and aortic atherosclerosis. However mixed lytic and sclerotic bony lesions were noted. On 10/10/2013 the patient underwent biopsy of the right iliac bone, and this showed ([id number]) metastatic invasive ductal carcinoma, grade 2, estrogen and progesterone receptor strongly positive.  Jade Mooney has a remote history of breast cancer, which we tried to reconstruct. She was originally diagnosed early in 1997  with stage III disease, and underwent right mastectomy followed by adjuvant chemotherapy. She then participated in a [facility] protocol with high-dose chemotherapy followed by stem cell rescue 12/22/1995.  Unfortunately later that year liver lesions were noted and were biopsy-proven to be metastatic breast cancer. This was not felt to be resectable. However the tumor was estrogen receptor positive and HER-2 positive. The patient was treated with Herceptin (she does not know for what period of time) and was then on tamoxifen until November of 2002. She also participated in a vaccine study at [facility].  Jade Mooney's subsequent history is as detailed below   PAST MEDICAL HISTORY: Past Medical History:  Diagnosis Date   Anxiety    Cancer [facility])    Breast 1997 right tx with mastectomy and chemo, metastatic now   GERD (gastroesophageal reflux disease)    Hypercholesterolemia    Hypertension    Hypothyroidism    Osteoarthritis    oa   Personal history of chemotherapy    Personal history of radiation therapy    Secondary malignant neoplasm of bone and bone marrow (HCC)    TIA (transient ischemic attack) last 11-08-15   x 2 total    PAST SURGICAL HISTORY: Past Surgical History:  Procedure Laterality Date   CATARACT EXTRACTION, BILATERAL     MASTECTOMY MODIFIED RADICAL Right 1997   porta cath insertion  1997   later removed   radiation tx  finished 02-25-16   x 20 tx   TONSILLECTOMY     TOTAL HIP ARTHROPLASTY Left 03/12/2016   Procedure: LEFT TOTAL HIP ARTHROPLASTY ANTERIOR APPROACH;  Surgeon: Jade Arabian, MD;  Location: [facility];  Service: Orthopedics;  Laterality: Left;   TOTAL KNEE ARTHROPLASTY Left    TUBAL LIGATION      FAMILY HISTORY: Family History  Problem Relation Age of Onset   Breast cancer Maternal Aunt   The patient's father died at the age of 72 from heart disease. The patient's mother died at the age of 48. The patient had no brothers, one sister. One of the patient's mother's 5 sisters was diagnosed with breast cancer in her 69s   GYNECOLOGIC HISTORY:  No LMP recorded. Patient is postmenopausal. Menarche age 89, the patient is GX P0. She stopped having periods approximately  1994. She used hormone replacement until 1997. 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 Her husband [name] (goes by [name]") was [employer]. of information all services at [employer]. They live alone, with no pets.   ADVANCED DIRECTIVES: In place   HEALTH MAINTENANCE: Social History   Tobacco Use   Smoking status: Never   Smokeless tobacco: Never  Substance Use Topics   Alcohol use: Yes    Comment: 1 glass wine 4-5 x week   Drug use: No    Colonoscopy: 2013  PAP: 2010  Bone density: 2012  Lipid panel:  Allergies  Allergen Reactions   Acetaminophen    Amoxicillin Hives    Has patient had a PCN reaction causing immediate rash, facial/tongue/throat swelling, SOB or lightheadedness with hypotension:unsure Has patient had a PCN reaction causing severe rash involving mucus membranes or skin necrosis:No Has patient had a PCN reaction that required hospitalization:No Has patient had a PCN reaction occurring within the last 10 years: Yes If all of the above answers are "NO", then may proceed with Cephalosporin use.    Erythromycin Other (See Comments)  Oxycodone    Oxycodone Hcl    Percocet [Oxycodone-Acetaminophen] Nausea And Vomiting   Percodan [Oxycodone-Aspirin] Nausea And Vomiting   Tramadol Itching    Current Outpatient Medications  Medication Sig Dispense Refill   acetaminophen (TYLENOL) 500 MG tablet Take 1,000 mg by mouth every 6 (six) hours as needed for mild pain. Reported on 04/17/2015     ascorbic acid (VITAMIN C) 1000 MG tablet Take 1,000 mg by mouth daily.     beta carotene 10000 UNIT capsule Take 10,000 Units by mouth daily.     calcium carbonate (OSCAL) 1500 (600 Ca) MG TABS tablet Take 600 mg of elemental calcium by mouth 2 (two) times daily with a meal.     calcium citrate-vitamin D (CITRACAL+D) 315-200 MG-UNIT tablet Take 1 tablet by mouth in the morning  and at bedtime.     cholecalciferol (VITAMIN D) 1000 units tablet Take 1,000 Units by mouth daily.     clopidogrel (PLAVIX) 75 MG tablet Take 1 tablet (75 mg total) by mouth daily.     cyanocobalamin (,VITAMIN B-12,) 1000 MCG/ML injection Inject 1,000 mcg into the muscle once.     Denosumab (XGEVA [place]) Inject into the skin.     famotidine (PEPCID) 40 MG tablet Take 1 tablet (40 mg total) by mouth 2 (two) times daily.     Fulvestrant (FASLODEX IM) Inject 500 mg into the muscle.     glucosamine-chondroitin 500-400 MG tablet Take 1 tablet by mouth daily.     ketotifen (ZADITOR) 0.025 % ophthalmic solution 1 drop 2 (two) times daily.     levothyroxine (SYNTHROID, LEVOTHROID) 88 MCG tablet Take 88 mcg by mouth daily before breakfast.     Lutein 20 MG CAPS Take 20 mg by mouth daily.     Magnesium (RA NATURAL MAGNESIUM) 250 MG TABS Take by mouth.     meclizine (ANTIVERT) 25 MG tablet Take 25 mg by mouth 3 (three) times daily as needed for dizziness.     Multiple Vitamins-Minerals (MULTIVITAMIN WITH MINERALS) tablet Take 1 tablet by mouth daily.     Omega-3 1000 MG CAPS Take 300-1,000 mg by mouth in the morning and at bedtime.     palbociclib (IBRANCE) 75 MG capsule Take 1 capsule (75 mg total) by mouth daily with breakfast. Take whole with food on days 1-21, every 28 days. 21 capsule 6   pantoprazole (PROTONIX) 40 MG tablet Take 40 mg by mouth 2 (two) times daily. Before breakfast and before supper     Polyethyl Glycol-Propyl Glycol 0.4-0.3 % SOLN Place 1-2 drops into both eyes 2 (two) times daily.     prednisoLONE acetate (PRED FORTE) 1 % ophthalmic suspension Place 1 drop into both eyes daily.     rOPINIRole (REQUIP) 1 MG tablet Take 1-2 mg by mouth at bedtime.     rosuvastatin (CRESTOR) 20 MG tablet Take 1 tablet (20 mg total) by mouth daily.     SHINGRIX injection      Specialty Vitamins Products (ELON MATRIX 5000) TABS Take 5,000 mcg by mouth daily.     vitamin E 180 MG (400 UNITS) capsule Take  180 Units by mouth daily.     zinc gluconate 50 MG tablet Take 50 mg by mouth daily.     No current facility-administered medications for this visit.    OBJECTIVE: White woman who appears stated age Vitals:   12/06/20 0912 12/06/20 0920  BP: (!) 162/88 (!) 143/81  Pulse: 63   Resp: 18   Temp:  97.7 F (36.5 C)   SpO2: 100%      Body mass index is 29.9 kg/m.    ECOG FS:1 - Symptomatic but completely ambulatory  Sclerae unicteric, EOMs intact Wearing a mask No cervical or supraclavicular adenopathy Lungs no rales or rhonchi Heart regular rate and rhythm Abd soft, nontender, positive bowel sounds MSK no focal spinal tenderness, no upper extremity lymphedema Neuro: nonfocal, with no residuals from her June 2022 TIA; well oriented, appropriate affect Breasts: The right breast is status post mastectomy and radiation.  There is a focal area of telangiectasia as previously noted.  Left breast and both axillae are benign   LAB RESULTS:  CMP     Component Value Date/Time   NA 137 12/06/2020 0903   NA 134 (A) 11/06/2020 0000   NA 138 02/09/2017 1213   K 4.2 12/06/2020 0903   K 3.9 02/09/2017 1213   CL 104 12/06/2020 0903   CO2 22 12/06/2020 0903   CO2 27 02/09/2017 1213   GLUCOSE 87 12/06/2020 0903   GLUCOSE 100 02/09/2017 1213   BUN 15 12/06/2020 0903   BUN 14 11/06/2020 0000   BUN 12.0 02/09/2017 1213   CREATININE 0.97 12/06/2020 0903   CREATININE 0.9 02/09/2017 1213   CALCIUM 9.4 12/06/2020 0903   CALCIUM 8.7 02/09/2017 1213   PROT 6.7 12/06/2020 0903   PROT 6.5 02/09/2017 1213   ALBUMIN 4.2 12/06/2020 0903   ALBUMIN 4.1 02/09/2017 1213   AST 21 12/06/2020 0903   AST 24 02/09/2017 1213   ALT 16 12/06/2020 0903   ALT 18 02/09/2017 1213   ALKPHOS 65 12/06/2020 0903   ALKPHOS 62 02/09/2017 1213   BILITOT 0.5 12/06/2020 0903   BILITOT 0.34 02/09/2017 1213   GFRNONAA >60 12/06/2020 0903   GFRAA >60 10/13/2019 1201   No results found for: SPEP  Lab Results   Component Value Date   WBC 2.3 (L) 12/06/2020   NEUTROABS 1.1 (L) 12/06/2020   HGB 12.1 12/06/2020   HCT 35.4 (L) 12/06/2020   MCV 89.6 12/06/2020   PLT 170 12/06/2020      Chemistry      Component Value Date/Time   NA 137 12/06/2020 0903   NA 134 (A) 11/06/2020 0000   NA 138 02/09/2017 1213   K 4.2 12/06/2020 0903   K 3.9 02/09/2017 1213   CL 104 12/06/2020 0903   CO2 22 12/06/2020 0903   CO2 27 02/09/2017 1213   BUN 15 12/06/2020 0903   BUN 14 11/06/2020 0000   BUN 12.0 02/09/2017 1213   CREATININE 0.97 12/06/2020 0903   CREATININE 0.9 02/09/2017 1213   GLU 123 11/06/2020 0000      Component Value Date/Time   CALCIUM 9.4 12/06/2020 0903   CALCIUM 8.7 02/09/2017 1213   ALKPHOS 65 12/06/2020 0903   ALKPHOS 62 02/09/2017 1213   AST 21 12/06/2020 0903   AST 24 02/09/2017 1213   ALT 16 12/06/2020 0903   ALT 18 02/09/2017 1213   BILITOT 0.5 12/06/2020 0903   BILITOT 0.34 02/09/2017 1213      No results for input(s): INR in the last 168 hours.  Urinalysis    Component Value Date/Time   COLORURINE STRAW (A) 03/05/2016 1350    STUDIES:  No results found.   ASSESSMENT: 75 y.o. [place] woman with stage IV right breast cancer as of NOV 2007 (bone metastases, subcutaneous nodules)  (1) s/p Right mastectomy 1997 for an estrogen receptor and HER-2 positive breast cancer, treated  with  (a) adjuvant chemotherapy according to CALGB 9640 (taxotere x 4 cycles)  (b) high-dose chemotherapy (cyclophosphamide, carboplatin, BCNU) followed by stem cell rescue at [place] 12/22/1995  (c) biopsy-proven metastatic disease November 2007, estrogen receptor and HER-2 positive  (d) trastuzumab (dose? Duration?)  (e) tamoxifen for 5 years completed 2002  (f) participation in a vaccine study at [facility] 2003 (CEA primed dendritic cells, HER-2 primed dendritic cells)  (2) status post right iliac crest biopsy 10/10/2013 for invasive ductal carcinoma, grade 2, estrogen and progesterone receptor  positive  (3) zoledronate started 10/12/2013,  repeated every 12 weeks  (a) dexa scan 07/28/2013 normal  (b) zolendronate discontinued after January 2019 dose  (4) biopsy of a scalp lesion 10/17/2013 showed metastatic breast cancer, HER-2/neu negative  (a) biopsy of a right anterior chest wall nodule August 2017 shows adenocarcinoma  (5) anastrozole started 10/28/2013, changed to letrozole 06/27/2016  (a) measurable disease = subcutaneous nodules in scalp and LLQ abdomen  (b)  CA-27-29 is informative  (c)  Repeat PET scan 12/28/2014 shows continuing response ; exam shows resolution of scalp lesions  (d) new skin lesion removed from right anterior chest wall August 2017  (e) status post radiation to the anterior lower right chest wall completed 02/25/2017 (20 sessions)   (f) PET scan 04/04/2016 shows 2 new foci of metabolic activity (T8 and manubrium)  (g) palbociclib added 04/21/2016 at 125 mg 21/7  (h) palbociclib dose dropped to 100 mg 21/7 starting with the March 2018 cycle  (i) bone scan 09/26/2016 is stable  (j) PET scan 02/03/2017 shows her bone metastases, and in addition a 0.5 cm right middle lobe nodule which may be new  (k) MRI of the cervical spine shows collapse of the left lateral mass of C3.  There is no soft tissue mass or focal neural impingement.  (l) palbociclib reduced to 75 mg as of September 2018  (m) palbociclib held during cervical spine irradiation finished in mid February, 2019  (n) letrozole discontinued February 2019 secondary to likely progression   (6) neoplasia associated pain  (a) painful blastic lesion left femoral intertrochanteric region: Status post radiation completed November 2015  (b) pain left knee, status post TKR remotely  (c) left total hip replacement 03/12/2016  (7) palliative radiation to the cervical spine completed 05/08/2017 in [place]  (8) fulvestrant started 05/04/2017  (a) Palbociclib resumed 06/29/2017, currently at 75 mg every other  day, 21 days on 7 days off  (9) denosumab/Xgeva started 06/01/2017, repeated every 28 days  (10) follow-up/staging studies  (a) PET scan 08/11/2017 shows no evidence for metabolically active tumor.  Sclerotic bone mets do not show hypermetabolism  (b) PET scan 02/11/2018 shows continuing excellent tumor control  (c) PET scan 08/05/2018 shows same bone lesions but no active disease  (d) PET scan 01/18/2019 finds stable disease  (e) PET scan 01/03/2020 finds no evidence of disease activity.  (11) history of recurrent transient ischemic attacks, most recently June 2022, currently on aspirin and Plavix for prevention, with further neurologic evaluation pending   PLAN: Jade Mooney is now a little more than 15 years out from definitive diagnosis of metastatic breast cancer.  She has no symptoms or signs related to her disease.  She is tolerating treatment well.  Accordingly we are continuing as before.  We are at the lowest dose of palbociclib that we have data for.  So long as her [place] is 1.0 or greater we will continue that.  Otherwise we would discontinue that and consider  after her 73-monthbreak switching to abemaciclib.  She may or may not need surgery to the right knee.  While that is being worked out I suggested she participate in some stationary bike or water exercises.  Of course she also has neck MRI pending and she might or might not need surgery there.  She will have a PET scan late November and she will return to see me 02/28/2021.  She knows to call for any other issue that may develop before then  Total encounter time 35 minutes.*Sarajane JewsC. Magrinat, MD 12/06/20 9:51 AM [facility] [address]Tel. [phone]   Fax. [phone]  I, KWilburn Mylar am acting as scribe for Dr. GVirgie Mooney Magrinat.  I, GLurline DelMD, have reviewed the above documentation for accuracy and completeness, and I  agree with the above.   *Total Encounter Time as defined by the Centers for Medicare and Medicaid Services includes, in addition to the face-to-face time of a patient visit (documented in the note above) non-face-to-face time: obtaining and reviewing outside history, ordering and reviewing medications, tests or procedures, care coordination (communications with other health care professionals or caregivers) and documentation in the medical record.

## 2020-12-06 ENCOUNTER — Telehealth: Payer: Self-pay

## 2020-12-06 ENCOUNTER — Inpatient Hospital Stay (HOSPITAL_BASED_OUTPATIENT_CLINIC_OR_DEPARTMENT_OTHER): Payer: Medicare Other | Admitting: Oncology

## 2020-12-06 ENCOUNTER — Inpatient Hospital Stay: Payer: Medicare Other | Attending: Oncology

## 2020-12-06 ENCOUNTER — Inpatient Hospital Stay: Payer: Medicare Other

## 2020-12-06 ENCOUNTER — Other Ambulatory Visit: Payer: Self-pay

## 2020-12-06 VITALS — BP 143/81 | HR 63 | Temp 97.7°F | Resp 18 | Ht 64.0 in | Wt 174.2 lb

## 2020-12-06 DIAGNOSIS — Z5111 Encounter for antineoplastic chemotherapy: Secondary | ICD-10-CM | POA: Insufficient documentation

## 2020-12-06 DIAGNOSIS — C7951 Secondary malignant neoplasm of bone: Secondary | ICD-10-CM | POA: Insufficient documentation

## 2020-12-06 DIAGNOSIS — C50919 Malignant neoplasm of unspecified site of unspecified female breast: Secondary | ICD-10-CM

## 2020-12-06 DIAGNOSIS — C50912 Malignant neoplasm of unspecified site of left female breast: Secondary | ICD-10-CM

## 2020-12-06 DIAGNOSIS — C50811 Malignant neoplasm of overlapping sites of right female breast: Secondary | ICD-10-CM | POA: Insufficient documentation

## 2020-12-06 DIAGNOSIS — Z170 Estrogen receptor positive status [ER+]: Secondary | ICD-10-CM

## 2020-12-06 DIAGNOSIS — L600 Ingrowing nail: Secondary | ICD-10-CM | POA: Diagnosis not present

## 2020-12-06 LAB — COMPREHENSIVE METABOLIC PANEL
ALT: 16 U/L (ref 0–44)
AST: 21 U/L (ref 15–41)
Albumin: 4.2 g/dL (ref 3.5–5.0)
Alkaline Phosphatase: 65 U/L (ref 38–126)
Anion gap: 11 (ref 5–15)
BUN: 15 mg/dL (ref 8–23)
CO2: 22 mmol/L (ref 22–32)
Calcium: 9.4 mg/dL (ref 8.9–10.3)
Chloride: 104 mmol/L (ref 98–111)
Creatinine, Ser: 0.97 mg/dL (ref 0.44–1.00)
GFR, Estimated: >60 mL/min (ref >60)
Glucose, Bld: 87 mg/dL (ref 70–99)
Potassium: 4.2 mmol/L (ref 3.5–5.1)
Sodium: 137 mmol/L (ref 135–145)
Total Bilirubin: 0.5 mg/dL (ref 0.3–1.2)
Total Protein: 6.7 g/dL (ref 6.5–8.1)

## 2020-12-06 LAB — CBC WITH DIFFERENTIAL/PLATELET
Abs Immature Granulocytes: 0.01 10*3/uL (ref 0.00–0.07)
Basophils Absolute: 0 10*3/uL (ref 0.0–0.1)
Basophils Relative: 1 %
Eosinophils Absolute: 0.1 10*3/uL (ref 0.0–0.5)
Eosinophils Relative: 3 %
HCT: 35.4 % — ABNORMAL LOW (ref 36.0–46.0)
Hemoglobin: 12.1 g/dL (ref 12.0–15.0)
Immature Granulocytes: 0 %
Lymphocytes Relative: 32 %
Lymphs Abs: 0.7 10*3/uL (ref 0.7–4.0)
MCH: 30.6 pg (ref 26.0–34.0)
MCHC: 34.2 g/dL (ref 30.0–36.0)
MCV: 89.6 fL (ref 80.0–100.0)
Monocytes Absolute: 0.4 10*3/uL (ref 0.1–1.0)
Monocytes Relative: 15 %
Neutro Abs: 1.1 10*3/uL — ABNORMAL LOW (ref 1.7–7.7)
Neutrophils Relative %: 49 %
Platelets: 170 10*3/uL (ref 150–400)
RBC: 3.95 MIL/uL (ref 3.87–5.11)
RDW: 15.1 % (ref 11.5–15.5)
WBC: 2.3 10*3/uL — ABNORMAL LOW (ref 4.0–10.5)
nRBC: 0 % (ref 0.0–0.2)

## 2020-12-06 MED ORDER — DENOSUMAB 120 MG/1.7ML ~~LOC~~ SOLN
120.0000 mg | Freq: Once | SUBCUTANEOUS | Status: AC
  Administered 2020-12-06: 120 mg via SUBCUTANEOUS
  Filled 2020-12-06: qty 1.7

## 2020-12-06 MED ORDER — FULVESTRANT 250 MG/5ML IM SOLN
500.0000 mg | Freq: Once | INTRAMUSCULAR | Status: AC
  Administered 2020-12-06: 500 mg via INTRAMUSCULAR
  Filled 2020-12-06: qty 10

## 2020-12-06 NOTE — Telephone Encounter (Signed)
Oral Chemotherapy Pharmacist Encounter   Dispensed samples to patient:   Medication: Ibrance '75mg'$  tablets  Instructions: Take 1 tablet by mouth every other day for 21 days on, 7 days off, repeat every 28 days.  Quantity Dispensed: 21 Days Supply: 48 Manufacturer: Pfizer Lot: [id number] Exp: 09/2022  Drema Halon, PharmD Hematology/Oncology Clinical Pharmacist [facility] [phone] 12/06/2020 9:41AM

## 2020-12-06 NOTE — Addendum Note (Signed)
Addended by: Lurline Del C on: 12/06/2020 11:02 AM   Modules accepted: Orders

## 2020-12-07 ENCOUNTER — Other Ambulatory Visit: Payer: Self-pay | Admitting: Neurological Surgery

## 2020-12-07 DIAGNOSIS — M47892 Other spondylosis, cervical region: Secondary | ICD-10-CM

## 2020-12-19 DIAGNOSIS — Z23 Encounter for immunization: Secondary | ICD-10-CM | POA: Diagnosis not present

## 2020-12-27 ENCOUNTER — Other Ambulatory Visit: Payer: Self-pay | Admitting: Pharmacist

## 2020-12-28 ENCOUNTER — Ambulatory Visit
Admission: RE | Admit: 2020-12-28 | Discharge: 2020-12-28 | Disposition: A | Discharge status: Home / Self Care | Payer: Medicare Other | Source: Ambulatory Visit | Attending: Neurological Surgery | Admitting: Neurological Surgery

## 2020-12-28 ENCOUNTER — Other Ambulatory Visit: Payer: Self-pay

## 2020-12-28 ENCOUNTER — Other Ambulatory Visit: Payer: Self-pay | Admitting: Oncology

## 2020-12-28 DIAGNOSIS — M4802 Spinal stenosis, cervical region: Secondary | ICD-10-CM | POA: Diagnosis not present

## 2020-12-28 DIAGNOSIS — M47892 Other spondylosis, cervical region: Secondary | ICD-10-CM

## 2020-12-31 DIAGNOSIS — D519 Vitamin B12 deficiency anemia, unspecified: Secondary | ICD-10-CM | POA: Diagnosis not present

## 2021-01-01 ENCOUNTER — Other Ambulatory Visit: Payer: Self-pay | Admitting: Hematology and Oncology

## 2021-01-01 ENCOUNTER — Inpatient Hospital Stay: Payer: Medicare Other | Attending: Oncology

## 2021-01-01 DIAGNOSIS — Z170 Estrogen receptor positive status [ER+]: Secondary | ICD-10-CM | POA: Insufficient documentation

## 2021-01-01 DIAGNOSIS — C50919 Malignant neoplasm of unspecified site of unspecified female breast: Secondary | ICD-10-CM | POA: Diagnosis not present

## 2021-01-01 DIAGNOSIS — C7951 Secondary malignant neoplasm of bone: Secondary | ICD-10-CM | POA: Insufficient documentation

## 2021-01-01 DIAGNOSIS — C50811 Malignant neoplasm of overlapping sites of right female breast: Secondary | ICD-10-CM | POA: Insufficient documentation

## 2021-01-01 DIAGNOSIS — Z5111 Encounter for antineoplastic chemotherapy: Secondary | ICD-10-CM | POA: Insufficient documentation

## 2021-01-01 LAB — BASIC METABOLIC PANEL
BUN: 14 (ref 4–21)
CO2: 23 — AB (ref 13–22)
Chloride: 104 (ref 99–108)
Creatinine: 0.8 (ref 0.5–1.1)
Glucose: 97
Potassium: 4.2 (ref 3.4–5.3)
Sodium: 134 — AB (ref 137–147)

## 2021-01-01 LAB — HEPATIC FUNCTION PANEL
ALT: 15 (ref 7–35)
AST: 30 (ref 13–35)
Alkaline Phosphatase: 59 (ref 25–125)
Bilirubin, Total: 0.5

## 2021-01-01 LAB — CBC AND DIFFERENTIAL
HCT: 30 — AB (ref 36–46)
Hemoglobin: 12 (ref 12.0–16.0)
Neutrophils Absolute: 1.22
Platelets: 206 (ref 150–399)
WBC: 2.4

## 2021-01-01 LAB — CBC
MCV: 91 (ref 81–99)
RBC: 3.95 (ref 3.87–5.11)

## 2021-01-01 LAB — COMPREHENSIVE METABOLIC PANEL
Albumin: 4.2 (ref 3.5–5.0)
Calcium: 8.8 (ref 8.7–10.7)

## 2021-01-02 DIAGNOSIS — M4722 Other spondylosis with radiculopathy, cervical region: Secondary | ICD-10-CM | POA: Diagnosis not present

## 2021-01-03 ENCOUNTER — Inpatient Hospital Stay: Payer: Medicare Other

## 2021-01-03 ENCOUNTER — Other Ambulatory Visit: Payer: Self-pay

## 2021-01-03 VITALS — BP 177/84 | HR 67 | Temp 98.0°F | Resp 18 | Ht 64.0 in | Wt 174.5 lb

## 2021-01-03 DIAGNOSIS — C7951 Secondary malignant neoplasm of bone: Secondary | ICD-10-CM | POA: Diagnosis not present

## 2021-01-03 DIAGNOSIS — C50811 Malignant neoplasm of overlapping sites of right female breast: Secondary | ICD-10-CM | POA: Diagnosis not present

## 2021-01-03 DIAGNOSIS — Z170 Estrogen receptor positive status [ER+]: Secondary | ICD-10-CM | POA: Diagnosis not present

## 2021-01-03 DIAGNOSIS — Z5111 Encounter for antineoplastic chemotherapy: Secondary | ICD-10-CM | POA: Diagnosis not present

## 2021-01-03 DIAGNOSIS — C50919 Malignant neoplasm of unspecified site of unspecified female breast: Secondary | ICD-10-CM

## 2021-01-03 MED ORDER — DENOSUMAB 120 MG/1.7ML ~~LOC~~ SOLN
120.0000 mg | Freq: Once | SUBCUTANEOUS | Status: AC
  Administered 2021-01-03: 120 mg via SUBCUTANEOUS
  Filled 2021-01-03: qty 1.7

## 2021-01-03 MED ORDER — FULVESTRANT 250 MG/5ML IM SOLN
500.0000 mg | Freq: Once | INTRAMUSCULAR | Status: AC
  Administered 2021-01-03: 500 mg via INTRAMUSCULAR
  Filled 2021-01-03: qty 10

## 2021-01-03 NOTE — Patient Instructions (Signed)
Denosumab injection What is this medication? DENOSUMAB (den oh sue mab) slows bone breakdown. Prolia is used to treat osteoporosis in women after menopause and in men, and in people who are taking corticosteroids for 6 months or more. Xgeva is used to treat a high calcium level due to cancer and to prevent bone fractures and other bone problems caused by multiple myeloma or cancer bone metastases. Xgeva is also used to treat giant cell tumor of the bone. This medicine may be used for other purposes; ask your health care provider or pharmacist if you have questions. COMMON BRAND NAME(S): Prolia, XGEVA What should I tell my care team before I take this medication? They need to know if you have any of these conditions: dental disease having surgery or tooth extraction infection kidney disease low levels of calcium or Vitamin D in the blood malnutrition on hemodialysis skin conditions or sensitivity thyroid or parathyroid disease an unusual reaction to denosumab, other medicines, foods, dyes, or preservatives pregnant or trying to get pregnant breast-feeding How should I use this medication? This medicine is for injection under the skin. It is given by a health care professional in a hospital or clinic setting. A special MedGuide will be given to you before each treatment. Be sure to read this information carefully each time. For Prolia, talk to your pediatrician regarding the use of this medicine in children. Special care may be needed. For Xgeva, talk to your pediatrician regarding the use of this medicine in children. While this drug may be prescribed for children as young as 13 years for selected conditions, precautions do apply. Overdosage: If you think you have taken too much of this medicine contact a poison control center or emergency room at once. NOTE: This medicine is only for you. Do not share this medicine with others. What if I miss a dose? It is important not to miss your dose.  Call your doctor or health care professional if you are unable to keep an appointment. What may interact with this medication? Do not take this medicine with any of the following medications: other medicines containing denosumab This medicine may also interact with the following medications: medicines that lower your chance of fighting infection steroid medicines like prednisone or cortisone This list may not describe all possible interactions. Give your health care provider a list of all the medicines, herbs, non-prescription drugs, or dietary supplements you use. Also tell them if you smoke, drink alcohol, or use illegal drugs. Some items may interact with your medicine. What should I watch for while using this medication? Visit your doctor or health care professional for regular checks on your progress. Your doctor or health care professional may order blood tests and other tests to see how you are doing. Call your doctor or health care professional for advice if you get a fever, chills or sore throat, or other symptoms of a cold or flu. Do not treat yourself. This drug may decrease your body's ability to fight infection. Try to avoid being around people who are sick. You should make sure you get enough calcium and vitamin D while you are taking this medicine, unless your doctor tells you not to. Discuss the foods you eat and the vitamins you take with your health care professional. See your dentist regularly. Brush and floss your teeth as directed. Before you have any dental work done, tell your dentist you are receiving this medicine. Do not become pregnant while taking this medicine or for 5 months after   birth control options while taking this medicine. Women should inform their doctor if they wish to become pregnant or think they might be pregnant. There is a potential for serious side effects to an unborn child. Talk to your health  careprofessional or pharmacist for more information. What side effects may I notice from receiving this medication? Side effects that you should report to your doctor or health care professionalas soon as possible: allergic reactions like skin rash, itching or hives, swelling of the face, lips, or tongue bone pain breathing problems dizziness jaw pain, especially after dental work redness, blistering, peeling of the skin signs and symptoms of infection like fever or chills; cough; sore throat; pain or trouble passing urine signs of low calcium like fast heartbeat, muscle cramps or muscle pain; pain, tingling, numbness in the hands or feet; seizures unusual bleeding or bruising unusually weak or tired Side effects that usually do not require medical attention (report to yourdoctor or health care professional if they continue or are bothersome): constipation diarrhea headache joint pain loss of appetite muscle pain runny nose tiredness upset stomach This list may not describe all possible side effects. Call your doctor for medical advice about side effects. You may report side effects to FDA at1-800-FDA-1088. Where should I keep my medication? This medicine is only given in a clinic, doctor's office, or other health caresetting and will not be stored at home. NOTE: This sheet is a summary. It may not cover all possible information. If you have questions about this medicine, talk to your doctor, pharmacist, orhealth care provider.  2022 Elsevier/Gold Standard (2017-07-17 16:10:44) Fulvestrant injection What is this medication? FULVESTRANT (ful VES trant) blocks the effects of estrogen. It is used to treatbreast cancer. This medicine may be used for other purposes; ask your health care provider orpharmacist if you have questions. COMMON BRAND NAME(S): FASLODEX What should I tell my care team before I take this medication? They need to know if you have any of these conditions: bleeding  disorders liver disease low blood counts, like low white cell, platelet, or red cell counts an unusual or allergic reaction to fulvestrant, other medicines, foods, dyes, or preservatives pregnant or trying to get pregnant breast-feeding How should I use this medication? This medicine is for injection into a muscle. It is usually given by a healthcare professional in a hospital or clinic setting. Talk to your pediatrician regarding the use of this medicine in children.Special care may be needed. Overdosage: If you think you have taken too much of this medicine contact apoison control center or emergency room at once. NOTE: This medicine is only for you. Do not share this medicine with others. What if I miss a dose? It is important not to miss your dose. Call your doctor or health careprofessional if you are unable to keep an appointment. What may interact with this medication? medicines that treat or prevent blood clots like warfarin, enoxaparin, dalteparin, apixaban, dabigatran, and rivaroxaban This list may not describe all possible interactions. Give your health care provider a list of all the medicines, herbs, non-prescription drugs, or dietary supplements you use. Also tell them if you smoke, drink alcohol, or use illegaldrugs. Some items may interact with your medicine. What should I watch for while using this medication? Your condition will be monitored carefully while you are receiving this medicine. You will need important blood work done while you are taking thismedicine. Do not become pregnant while taking this medicine or for at least 1 year after stopping   it. Women of child-bearing potential will need to have a negative pregnancy test before starting this medicine. Women should inform their doctor if they wish to become pregnant or think they might be pregnant. There is a potential for serious side effects to an unborn child. Men should inform their doctors if they wish to father a  child. This medicine may lower sperm counts. Talk to your health care professional or pharmacist for more information. Do not breast-feed an infant while taking this medicine or for 1 year after thelast dose. What side effects may I notice from receiving this medication? Side effects that you should report to your doctor or health care professionalas soon as possible: allergic reactions like skin rash, itching or hives, swelling of the face, lips, or tongue feeling faint or lightheaded, falls pain, tingling, numbness, or weakness in the legs signs and symptoms of infection like fever or chills; cough; flu-like symptoms; sore throat vaginal bleeding Side effects that usually do not require medical attention (report to yourdoctor or health care professional if they continue or are bothersome): aches, pains constipation diarrhea headache hot flashes nausea, vomiting pain at site where injected stomach pain This list may not describe all possible side effects. Call your doctor for medical advice about side effects. You may report side effects to FDA at1-800-FDA-1088. Where should I keep my medication? This drug is given in a hospital or clinic and will not be stored at home. NOTE: This sheet is a summary. It may not cover all possible information. If you have questions about this medicine, talk to your doctor, pharmacist, orhealth care provider.  2022 Elsevier/Gold Standard (2017-06-18 11:34:41)  

## 2021-01-03 NOTE — Progress Notes (Signed)
1045: PT STABLE AT TIME OF DISCHARGE

## 2021-01-04 DIAGNOSIS — M1711 Unilateral primary osteoarthritis, right knee: Secondary | ICD-10-CM | POA: Diagnosis not present

## 2021-01-11 DIAGNOSIS — M1711 Unilateral primary osteoarthritis, right knee: Secondary | ICD-10-CM | POA: Diagnosis not present

## 2021-01-21 DIAGNOSIS — E538 Deficiency of other specified B group vitamins: Secondary | ICD-10-CM | POA: Diagnosis not present

## 2021-01-21 DIAGNOSIS — M4722 Other spondylosis with radiculopathy, cervical region: Secondary | ICD-10-CM | POA: Diagnosis not present

## 2021-01-21 DIAGNOSIS — I10 Essential (primary) hypertension: Secondary | ICD-10-CM | POA: Diagnosis not present

## 2021-01-21 DIAGNOSIS — R030 Elevated blood-pressure reading, without diagnosis of hypertension: Secondary | ICD-10-CM | POA: Diagnosis not present

## 2021-01-21 DIAGNOSIS — M47892 Other spondylosis, cervical region: Secondary | ICD-10-CM | POA: Diagnosis not present

## 2021-01-21 DIAGNOSIS — E782 Mixed hyperlipidemia: Secondary | ICD-10-CM | POA: Diagnosis not present

## 2021-01-21 DIAGNOSIS — Z6830 Body mass index (BMI) 30.0-30.9, adult: Secondary | ICD-10-CM | POA: Diagnosis not present

## 2021-01-21 DIAGNOSIS — C419 Malignant neoplasm of bone and articular cartilage, unspecified: Secondary | ICD-10-CM | POA: Diagnosis not present

## 2021-01-25 DIAGNOSIS — M26642 Arthritis of left temporomandibular joint: Secondary | ICD-10-CM | POA: Diagnosis not present

## 2021-01-25 DIAGNOSIS — K648 Other hemorrhoids: Secondary | ICD-10-CM | POA: Diagnosis not present

## 2021-01-29 ENCOUNTER — Telehealth: Payer: Self-pay | Admitting: Oncology

## 2021-01-29 ENCOUNTER — Inpatient Hospital Stay: Payer: Medicare Other | Attending: Oncology

## 2021-01-29 ENCOUNTER — Encounter: Payer: Self-pay | Admitting: Hematology and Oncology

## 2021-01-29 ENCOUNTER — Other Ambulatory Visit: Payer: Self-pay | Admitting: Hematology and Oncology

## 2021-01-29 DIAGNOSIS — C7951 Secondary malignant neoplasm of bone: Secondary | ICD-10-CM | POA: Insufficient documentation

## 2021-01-29 DIAGNOSIS — Z170 Estrogen receptor positive status [ER+]: Secondary | ICD-10-CM | POA: Insufficient documentation

## 2021-01-29 DIAGNOSIS — E538 Deficiency of other specified B group vitamins: Secondary | ICD-10-CM | POA: Diagnosis not present

## 2021-01-29 DIAGNOSIS — C50919 Malignant neoplasm of unspecified site of unspecified female breast: Secondary | ICD-10-CM

## 2021-01-29 DIAGNOSIS — Z5111 Encounter for antineoplastic chemotherapy: Secondary | ICD-10-CM | POA: Insufficient documentation

## 2021-01-29 DIAGNOSIS — C50811 Malignant neoplasm of overlapping sites of right female breast: Secondary | ICD-10-CM | POA: Insufficient documentation

## 2021-01-29 LAB — BASIC METABOLIC PANEL
BUN: 19 (ref 4–21)
CO2: 22 (ref 13–22)
Chloride: 104 (ref 99–108)
Creatinine: 0.8 (ref 0.5–1.1)
Glucose: 101
Potassium: 4.4 (ref 3.4–5.3)
Sodium: 137 (ref 137–147)

## 2021-01-29 LAB — HEPATIC FUNCTION PANEL
ALT: 15 (ref 7–35)
AST: 30 (ref 13–35)
Alkaline Phosphatase: 56 (ref 25–125)
Bilirubin, Total: 0.4

## 2021-01-29 LAB — CBC: RBC: 4.06 (ref 3.87–5.11)

## 2021-01-29 LAB — CBC AND DIFFERENTIAL
HCT: 37 (ref 36–46)
Hemoglobin: 12.4 (ref 12.0–16.0)
Neutrophils Absolute: 1.56
Platelets: 191 (ref 150–399)
WBC: 3

## 2021-01-29 LAB — COMPREHENSIVE METABOLIC PANEL
Albumin: 4.4 (ref 3.5–5.0)
Calcium: 8.6 — AB (ref 8.7–10.7)

## 2021-01-29 NOTE — Telephone Encounter (Signed)
Patient made her Jan/Feb 2023 Appt's - Dec Appt's are scheduled in [place] patient Appt Summary

## 2021-01-30 ENCOUNTER — Telehealth: Payer: Self-pay

## 2021-01-30 NOTE — Telephone Encounter (Signed)
Pt called and asked about getting PET scan scheduled. PET has been [place] by insurance. Pt was provided with number to central scheduling for PET. Pt verbalized thanks and understanding.

## 2021-01-31 ENCOUNTER — Inpatient Hospital Stay: Payer: Medicare Other

## 2021-01-31 ENCOUNTER — Other Ambulatory Visit: Payer: Self-pay

## 2021-01-31 VITALS — BP 171/74 | HR 86 | Temp 98.0°F | Resp 18 | Ht 64.0 in | Wt 172.1 lb

## 2021-01-31 DIAGNOSIS — C50919 Malignant neoplasm of unspecified site of unspecified female breast: Secondary | ICD-10-CM

## 2021-01-31 DIAGNOSIS — Z170 Estrogen receptor positive status [ER+]: Secondary | ICD-10-CM | POA: Diagnosis not present

## 2021-01-31 DIAGNOSIS — C7951 Secondary malignant neoplasm of bone: Secondary | ICD-10-CM | POA: Diagnosis not present

## 2021-01-31 DIAGNOSIS — C50811 Malignant neoplasm of overlapping sites of right female breast: Secondary | ICD-10-CM | POA: Diagnosis not present

## 2021-01-31 DIAGNOSIS — Z5111 Encounter for antineoplastic chemotherapy: Secondary | ICD-10-CM | POA: Diagnosis not present

## 2021-01-31 MED ORDER — FULVESTRANT 250 MG/5ML IM SOSY
500.0000 mg | PREFILLED_SYRINGE | Freq: Once | INTRAMUSCULAR | Status: AC
  Administered 2021-01-31: 500 mg via INTRAMUSCULAR
  Filled 2021-01-31: qty 10

## 2021-01-31 MED ORDER — DENOSUMAB 120 MG/1.7ML ~~LOC~~ SOLN
120.0000 mg | Freq: Once | SUBCUTANEOUS | Status: AC
  Administered 2021-01-31: 120 mg via SUBCUTANEOUS
  Filled 2021-01-31: qty 1.7

## 2021-01-31 NOTE — Progress Notes (Signed)
Notified patient to follow up with primary care doc about her bp

## 2021-01-31 NOTE — Patient Instructions (Signed)
Fulvestrant injection °What is this medication? °FULVESTRANT (ful VES trant) blocks the effects of estrogen. It is used to treat breast cancer. °This medicine may be used for other purposes; ask your health care provider or pharmacist if you have questions. °COMMON BRAND NAME(S): FASLODEX °What should I tell my care team before I take this medication? °They need to know if you have any of these conditions: °bleeding disorders °liver disease °low blood counts, like low white cell, platelet, or red cell counts °an unusual or allergic reaction to fulvestrant, other medicines, foods, dyes, or preservatives °pregnant or trying to get pregnant °breast-feeding °How should I use this medication? °This medicine is for injection into a muscle. It is usually given by a health care professional in a hospital or clinic setting. °Talk to your pediatrician regarding the use of this medicine in children. Special care may be needed. °Overdosage: If you think you have taken too much of this medicine contact a poison control center or emergency room at once. °NOTE: This medicine is only for you. Do not share this medicine with others. °What if I miss a dose? °It is important not to miss your dose. Call your doctor or health care professional if you are unable to keep an appointment. °What may interact with this medication? °medicines that treat or prevent blood clots like warfarin, enoxaparin, dalteparin, apixaban, dabigatran, and rivaroxaban °This list may not describe all possible interactions. Give your health care provider a list of all the medicines, herbs, non-prescription drugs, or dietary supplements you use. Also tell them if you smoke, drink alcohol, or use illegal drugs. Some items may interact with your medicine. °What should I watch for while using this medication? °Your condition will be monitored carefully while you are receiving this medicine. You will need important blood work done while you are taking this  medicine. °Do not become pregnant while taking this medicine or for at least 1 year after stopping it. Women of child-bearing potential will need to have a negative pregnancy test before starting this medicine. Women should inform their doctor if they wish to become pregnant or think they might be pregnant. There is a potential for serious side effects to an unborn child. Men should inform their doctors if they wish to father a child. This medicine may lower sperm counts. Talk to your health care professional or pharmacist for more information. Do not breast-feed an infant while taking this medicine or for 1 year after the last dose. °What side effects may I notice from receiving this medication? °Side effects that you should report to your doctor or health care professional as soon as possible: °allergic reactions like skin rash, itching or hives, swelling of the face, lips, or tongue °feeling faint or lightheaded, falls °pain, tingling, numbness, or weakness in the legs °signs and symptoms of infection like fever or chills; cough; flu-like symptoms; sore throat °vaginal bleeding °Side effects that usually do not require medical attention (report to your doctor or health care professional if they continue or are bothersome): °aches, pains °constipation °diarrhea °headache °hot flashes °nausea, vomiting °pain at site where injected °stomach pain °This list may not describe all possible side effects. Call your doctor for medical advice about side effects. You may report side effects to FDA at 1-800-FDA-1088. °Where should I keep my medication? °This drug is given in a hospital or clinic and will not be stored at home. °NOTE: This sheet is a summary. It may not cover all possible information. If you have   questions about this medicine, talk to your doctor, pharmacist, or health care provider. °© 2022 Elsevier/Gold Standard (2017-06-23 00:00:00) °Denosumab injection °What is this medication? °DENOSUMAB (den oh sue mab)  slows bone breakdown. Prolia is used to treat osteoporosis in women after menopause and in men, and in people who are taking corticosteroids for 6 months or more. Xgeva is used to treat a high calcium level due to cancer and to prevent bone fractures and other bone problems caused by multiple myeloma or cancer bone metastases. Xgeva is also used to treat giant cell tumor of the bone. °This medicine may be used for other purposes; ask your health care provider or pharmacist if you have questions. °COMMON BRAND NAME(S): Prolia, XGEVA °What should I tell my care team before I take this medication? °They need to know if you have any of these conditions: °dental disease °having surgery or tooth extraction °infection °kidney disease °low levels of calcium or Vitamin D in the blood °malnutrition °on hemodialysis °skin conditions or sensitivity °thyroid or parathyroid disease °an unusual reaction to denosumab, other medicines, foods, dyes, or preservatives °pregnant or trying to get pregnant °breast-feeding °How should I use this medication? °This medicine is for injection under the skin. It is given by a health care professional in a hospital or clinic setting. °A special MedGuide will be given to you before each treatment. Be sure to read this information carefully each time. °For Prolia, talk to your pediatrician regarding the use of this medicine in children. Special care may be needed. For Xgeva, talk to your pediatrician regarding the use of this medicine in children. While this drug may be prescribed for children as young as 13 years for selected conditions, precautions do apply. °Overdosage: If you think you have taken too much of this medicine contact a poison control center or emergency room at once. °NOTE: This medicine is only for you. Do not share this medicine with others. °What if I miss a dose? °It is important not to miss your dose. Call your doctor or health care professional if you are unable to keep an  appointment. °What may interact with this medication? °Do not take this medicine with any of the following medications: °other medicines containing denosumab °This medicine may also interact with the following medications: °medicines that lower your chance of fighting infection °steroid medicines like prednisone or cortisone °This list may not describe all possible interactions. Give your health care provider a list of all the medicines, herbs, non-prescription drugs, or dietary supplements you use. Also tell them if you smoke, drink alcohol, or use illegal drugs. Some items may interact with your medicine. °What should I watch for while using this medication? °Visit your doctor or health care professional for regular checks on your progress. Your doctor or health care professional may order blood tests and other tests to see how you are doing. °Call your doctor or health care professional for advice if you get a fever, chills or sore throat, or other symptoms of a cold or flu. Do not treat yourself. This drug may decrease your body's ability to fight infection. Try to avoid being around people who are sick. °You should make sure you get enough calcium and vitamin D while you are taking this medicine, unless your doctor tells you not to. Discuss the foods you eat and the vitamins you take with your health care professional. °See your dentist regularly. Brush and floss your teeth as directed. Before you have any dental work done, tell   your dentist you are receiving this medicine. °Do not become pregnant while taking this medicine or for 5 months after stopping it. Talk with your doctor or health care professional about your birth control options while taking this medicine. Women should inform their doctor if they wish to become pregnant or think they might be pregnant. There is a potential for serious side effects to an unborn child. Talk to your health care professional or pharmacist for more information. °What side  effects may I notice from receiving this medication? °Side effects that you should report to your doctor or health care professional as soon as possible: °allergic reactions like skin rash, itching or hives, swelling of the face, lips, or tongue °bone pain °breathing problems °dizziness °jaw pain, especially after dental work °redness, blistering, peeling of the skin °signs and symptoms of infection like fever or chills; cough; sore throat; pain or trouble passing urine °signs of low calcium like fast heartbeat, muscle cramps or muscle pain; pain, tingling, numbness in the hands or feet; seizures °unusual bleeding or bruising °unusually weak or tired °Side effects that usually do not require medical attention (report to your doctor or health care professional if they continue or are bothersome): °constipation °diarrhea °headache °joint pain °loss of appetite °muscle pain °runny nose °tiredness °upset stomach °This list may not describe all possible side effects. Call your doctor for medical advice about side effects. You may report side effects to FDA at 1-800-FDA-1088. °Where should I keep my medication? °This medicine is only given in a clinic, doctor's office, or other health care setting and will not be stored at home. °NOTE: This sheet is a summary. It may not cover all possible information. If you have questions about this medicine, talk to your doctor, pharmacist, or health care provider. °© 2022 [employer] Standard (2017-07-17 00:00:00) ° °

## 2021-02-13 DIAGNOSIS — H353131 Nonexudative age-related macular degeneration, bilateral, early dry stage: Secondary | ICD-10-CM | POA: Diagnosis not present

## 2021-02-15 DIAGNOSIS — Z23 Encounter for immunization: Secondary | ICD-10-CM | POA: Diagnosis not present

## 2021-02-21 ENCOUNTER — Ambulatory Visit (HOSPITAL_COMMUNITY)
Admission: RE | Admit: 2021-02-21 | Discharge: 2021-02-21 | Disposition: A | Discharge status: Home / Self Care | Payer: Medicare Other | Source: Ambulatory Visit | Attending: Oncology | Admitting: Oncology

## 2021-02-21 ENCOUNTER — Telehealth: Payer: Self-pay

## 2021-02-21 ENCOUNTER — Other Ambulatory Visit: Payer: Self-pay | Admitting: Oncology

## 2021-02-21 ENCOUNTER — Other Ambulatory Visit: Payer: Self-pay

## 2021-02-21 DIAGNOSIS — I2510 Atherosclerotic heart disease of native coronary artery without angina pectoris: Secondary | ICD-10-CM | POA: Diagnosis not present

## 2021-02-21 DIAGNOSIS — Z170 Estrogen receptor positive status [ER+]: Secondary | ICD-10-CM | POA: Diagnosis not present

## 2021-02-21 DIAGNOSIS — C50919 Malignant neoplasm of unspecified site of unspecified female breast: Secondary | ICD-10-CM

## 2021-02-21 DIAGNOSIS — C7951 Secondary malignant neoplasm of bone: Secondary | ICD-10-CM | POA: Insufficient documentation

## 2021-02-21 DIAGNOSIS — C50811 Malignant neoplasm of overlapping sites of right female breast: Secondary | ICD-10-CM | POA: Diagnosis not present

## 2021-02-21 DIAGNOSIS — K8020 Calculus of gallbladder without cholecystitis without obstruction: Secondary | ICD-10-CM | POA: Diagnosis not present

## 2021-02-21 DIAGNOSIS — N8189 Other female genital prolapse: Secondary | ICD-10-CM | POA: Diagnosis not present

## 2021-02-21 DIAGNOSIS — C50911 Malignant neoplasm of unspecified site of right female breast: Secondary | ICD-10-CM | POA: Diagnosis not present

## 2021-02-21 LAB — GLUCOSE, CAPILLARY: Glucose-Capillary: 120 mg/dL — ABNORMAL HIGH (ref 70–99)

## 2021-02-21 MED ORDER — FLUDEOXYGLUCOSE F - 18 (FDG) INJECTION
8.9000 | Freq: Once | INTRAVENOUS | Status: AC
  Administered 2021-02-21: 8.6 via INTRAVENOUS

## 2021-02-21 NOTE — Telephone Encounter (Signed)
Oral Chemotherapy Pharmacist Encounter   Dispensed samples to patient:   Medication: Ibrance 75 mg tablets Instructions: Take 1 tablet by mouth every other day for 21 days on, 7 days off, repeat every 28 days Quantity dispensed: 42 Days supply: 101 Manufacturer: [place] Lot: [id number] Exp: 09/2022  Drema Halon, PharmD Hematology/Oncology Clinical Pharmacist [facility] [phone]

## 2021-02-27 DIAGNOSIS — E538 Deficiency of other specified B group vitamins: Secondary | ICD-10-CM | POA: Diagnosis not present

## 2021-02-27 NOTE — Progress Notes (Signed)
[place]  [phone] [phone]    ID: Alphia Behanna DOB: 04-13-1945  MR#: [id number]  ZJQ#:[id number]  Patient Care Team: Street, Sharon Mt, MD as PCP - General (Family Medicine) Clarene Essex, MD as Consulting Physician (Gastroenterology) Jalena Vanderlinden, Virgie Dad, MD as Consulting Physician (Oncology) Gaynelle Arabian, MD as Consulting Physician (Orthopedic Surgery) Gatha Mayer, MD as Consulting Physician (Radiation Oncology) Kristeen Miss, MD as Consulting Physician (Neurosurgery) Derwood Kaplan, MD as Consulting Physician (Oncology)   CHIEF COMPLAINT: metastatic breast cancer (s/p right mastectomy)  CURRENT TREATMENT: fulvestrant, denosumab/Xgeva, palbociclib   INTERVAL HISTORY: Cortez returns today for follow-up and treatment of her estrogen receptor positive breast cancer.  She is accompanied by her husband [name]  Since her last visit, she underwent restaging PET scan on 02/21/2021 showing: no FDG-avid soft tissue metastasis; diffuse sclerotic osseous metastasis, without FDG affinity; similar left chest wall or medial left breast subcutaneous 5 mm nodule with low-level hypermetabolism, favored benign and incidental.  She continues on palbociclib/Ibrance at 75 mg every other day, 21 days on 7 days off.  She tolerates this with no side effects that she is aware of.  She is obtaining it at no cost out-of-pocket  She also continues on fulvestrant, which she receives today and every 28 days.  Aside from the discomfort of the administration (which can last up to a week) she tolerates this well.    Finally, she also continues on denosumab/Xgeva.  She receives that also every 28 days and again 2 out of every 3 doses she receives in [place] and every third dose including today's she receives it here.  There have been no dental issues to date.  We are following the CA 27-29: Lab Results  Component Value Date   CA2729 28.2 09/13/2020    CA2729 28.5 06/21/2020   CA2729 28.5 05/22/2020   CA2729 30.8 03/29/2020   CA2729 25.1 02/28/2020    REVIEW OF SYSTEMS: Mabrey is having significant problems with her right knee.  She has had left knee replacement before.  She also has some back issues.  None of this is new.  She went on a cruise which she enjoyed.  She is working at her church and is otherwise active although not exercising regularly.  She is gained a little weight.  A detailed review of systems is otherwise stable   COVID 19 VACCINATION STATUS: Status post Pfizer x4; status post COVID infection 11/11/2018   BREAST CANCER HISTORY: From the earlier summary notes:  Chelsee was referred to Dr. Clarene Essex for evaluation of abdominal discomfort and nausea. Exam and blood work including liver function tests was unremarkable aside from normochromic normocytic anemia. Cholecystitis was suspected and the patient underwent endoscopy followed by abdominal/ pelvic CT scan on 09/29/2013. This showed showed a normal gallbladder. Incidental findings included multiple simple cysts in the liver and aortic atherosclerosis. However mixed lytic and sclerotic bony lesions were noted. On 10/10/2013 the patient underwent biopsy of the right iliac bone, and this showed ([id number]) metastatic invasive ductal carcinoma, grade 2, estrogen and progesterone receptor strongly positive.  Leyah has a remote history of breast cancer, which we tried to reconstruct. She was originally diagnosed early in 1997 with stage III disease, and underwent right mastectomy followed by adjuvant chemotherapy. She then participated in a [facility] protocol with high-dose chemotherapy followed by stem cell rescue 12/22/1995. Unfortunately later that year liver lesions were noted and were biopsy-proven to be metastatic breast cancer. This was not felt to  be resectable. However the tumor was estrogen receptor positive and HER-2 positive. The patient was treated with Herceptin (she does  not know for what period of time) and was then on tamoxifen until November of 2002. She also participated in a vaccine study at [facility].  Christean's subsequent history is as detailed below   PAST MEDICAL HISTORY: Past Medical History:  Diagnosis Date   Anxiety    Cancer [facility])    Breast 1997 right tx with mastectomy and chemo, metastatic now   GERD (gastroesophageal reflux disease)    Hypercholesterolemia    Hypertension    Hypothyroidism    Osteoarthritis    oa   Personal history of chemotherapy    Personal history of radiation therapy    Secondary malignant neoplasm of bone and bone marrow (HCC)    TIA (transient ischemic attack) last 11-08-15   x 2 total    PAST SURGICAL HISTORY: Past Surgical History:  Procedure Laterality Date   CATARACT EXTRACTION, BILATERAL     MASTECTOMY MODIFIED RADICAL Right 1997   porta cath insertion  1997   later removed   radiation tx  finished 02-25-16   x 20 tx   TONSILLECTOMY     TOTAL HIP ARTHROPLASTY Left 03/12/2016   Procedure: LEFT TOTAL HIP ARTHROPLASTY ANTERIOR APPROACH;  Surgeon: Gaynelle Arabian, MD;  Location: [facility];  Service: Orthopedics;  Laterality: Left;   TOTAL KNEE ARTHROPLASTY Left    TUBAL LIGATION      FAMILY HISTORY: Family History  Problem Relation Age of Onset   Breast cancer Maternal Aunt   The patient's father died at the age of 42 from heart disease. The patient's mother died at the age of 33. The patient had no brothers, one sister. One of the patient's mother's 5 sisters was diagnosed with breast cancer in her 66s   GYNECOLOGIC HISTORY:  No LMP recorded. Patient is postmenopausal. Menarche age 61, the patient is GX P0. She stopped having periods approximately 1994. She used hormone replacement until 1997. 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 Her husband [name] (goes by [name]") was [employer]. of  information all services at [employer]. They live alone, with no pets.   ADVANCED DIRECTIVES: In place   HEALTH MAINTENANCE: Social History   Tobacco Use   Smoking status: Never   Smokeless tobacco: Never  Substance Use Topics   Alcohol use: Yes    Comment: 1 glass wine 4-5 x week   Drug use: No    Colonoscopy: 2013  PAP: 2010  Bone density: 2012  Lipid panel:  Allergies  Allergen Reactions   Acetaminophen    Amoxicillin Hives    Has patient had a PCN reaction causing immediate rash, facial/tongue/throat swelling, SOB or lightheadedness with hypotension:unsure Has patient had a PCN reaction causing severe rash involving mucus membranes or skin necrosis:No Has patient had a PCN reaction that required hospitalization:No Has patient had a PCN reaction occurring within the last 10 years: Yes If all of the above answers are "NO", then may proceed with Cephalosporin use.    Erythromycin Other (See Comments)   Oxycodone    Oxycodone Hcl    Percocet [Oxycodone-Acetaminophen] Nausea And Vomiting   Percodan [Oxycodone-Aspirin] Nausea And Vomiting   Tramadol Itching    Current Outpatient Medications  Medication Sig Dispense Refill   acetaminophen (TYLENOL) 500 MG tablet Take 1,000 mg by mouth  every 6 (six) hours as needed for mild pain. Reported on 04/17/2015     ascorbic acid (VITAMIN C) 1000 MG tablet Take 1,000 mg by mouth daily.     beta carotene 10000 UNIT capsule Take 10,000 Units by mouth daily.     calcium carbonate (OSCAL) 1500 (600 Ca) MG TABS tablet Take 600 mg of elemental calcium by mouth 2 (two) times daily with a meal.     calcium citrate-vitamin D (CITRACAL+D) 315-200 MG-UNIT tablet Take 1 tablet by mouth in the morning and at bedtime.     cholecalciferol (VITAMIN D) 1000 units tablet Take 1,000 Units by mouth daily.     clopidogrel (PLAVIX) 75 MG tablet Take 1 tablet (75 mg total) by mouth daily.     cyanocobalamin (,VITAMIN B-12,) 1000 MCG/ML injection Inject 1,000  mcg into the muscle once.     Denosumab (XGEVA [place]) Inject into the skin.     famotidine (PEPCID) 40 MG tablet Take 1 tablet (40 mg total) by mouth 2 (two) times daily.     Fulvestrant (FASLODEX IM) Inject 500 mg into the muscle.     glucosamine-chondroitin 500-400 MG tablet Take 1 tablet by mouth daily.     ketotifen (ZADITOR) 0.025 % ophthalmic solution 1 drop 2 (two) times daily.     levothyroxine (SYNTHROID, LEVOTHROID) 88 MCG tablet Take 88 mcg by mouth daily before breakfast.     Lutein 20 MG CAPS Take 20 mg by mouth daily.     Magnesium (RA NATURAL MAGNESIUM) 250 MG TABS Take by mouth.     meclizine (ANTIVERT) 25 MG tablet Take 25 mg by mouth 3 (three) times daily as needed for dizziness.     Multiple Vitamins-Minerals (MULTIVITAMIN WITH MINERALS) tablet Take 1 tablet by mouth daily.     Omega-3 1000 MG CAPS Take 300-1,000 mg by mouth in the morning and at bedtime.     palbociclib (IBRANCE) 75 MG capsule Take 1 capsule (75 mg total) by mouth daily with breakfast. Take whole with food on days 1-21, every 28 days. 21 capsule 6   pantoprazole (PROTONIX) 40 MG tablet Take 40 mg by mouth 2 (two) times daily. Before breakfast and before supper     Polyethyl Glycol-Propyl Glycol 0.4-0.3 % SOLN Place 1-2 drops into both eyes 2 (two) times daily.     prednisoLONE acetate (PRED FORTE) 1 % ophthalmic suspension Place 1 drop into both eyes daily.     rOPINIRole (REQUIP) 1 MG tablet Take 1-2 mg by mouth at bedtime.     rosuvastatin (CRESTOR) 20 MG tablet Take 1 tablet (20 mg total) by mouth daily.     SHINGRIX injection      Specialty Vitamins Products (ELON MATRIX 5000) TABS Take 5,000 mcg by mouth daily.     vitamin E 180 MG (400 UNITS) capsule Take 180 Units by mouth daily.     zinc gluconate 50 MG tablet Take 50 mg by mouth daily.     No current facility-administered medications for this visit.    OBJECTIVE: White woman who appears stated age 15:   02/28/21 0958  BP: (!) 131/59  Pulse:  62  Resp: 18  Temp: 97.6 F (36.4 C)  SpO2: 100%     Body mass index is 30.4 kg/m.    ECOG FS:1 - Symptomatic but completely ambulatory  Sclerae unicteric, EOMs intact Wearing a mask No cervical or supraclavicular adenopathy Lungs no rales or rhonchi Heart regular rate and rhythm Abd soft, nontender, positive  bowel sounds MSK kyphosis but no focal spinal tenderness, no right upper extremity lymphedema Neuro: nonfocal, well oriented, appropriate affect Breasts: The right breast has undergone mastectomy and radiation.  There is no evidence of chest wall recurrence.  The left breast is benign.  Both axillae are benign   LAB RESULTS:  CMP     Component Value Date/Time   NA 138 02/28/2021 0932   NA 137 01/29/2021 0000   NA 138 02/09/2017 1213   K 4.5 02/28/2021 0932   K 3.9 02/09/2017 1213   CL 106 02/28/2021 0932   CO2 22 02/28/2021 0932   CO2 27 02/09/2017 1213   GLUCOSE 84 02/28/2021 0932   GLUCOSE 100 02/09/2017 1213   BUN 18 02/28/2021 0932   BUN 19 01/29/2021 0000   BUN 12.0 02/09/2017 1213   CREATININE 1.06 (H) 02/28/2021 0932   CREATININE 0.9 02/09/2017 1213   CALCIUM 8.9 02/28/2021 0932   CALCIUM 8.7 02/09/2017 1213   PROT 6.6 02/28/2021 0932   PROT 6.5 02/09/2017 1213   ALBUMIN 4.2 02/28/2021 0932   ALBUMIN 4.1 02/09/2017 1213   AST 20 02/28/2021 0932   AST 24 02/09/2017 1213   ALT 16 02/28/2021 0932   ALT 18 02/09/2017 1213   ALKPHOS 58 02/28/2021 0932   ALKPHOS 62 02/09/2017 1213   BILITOT 0.5 02/28/2021 0932   BILITOT 0.34 02/09/2017 1213   GFRNONAA 54 (L) 02/28/2021 0932   GFRAA >60 10/13/2019 1201   No results found for: SPEP  Lab Results  Component Value Date   WBC 3.4 (L) 02/28/2021   NEUTROABS 1.8 02/28/2021   HGB 12.6 02/28/2021   HCT 37.8 02/28/2021   MCV 91.1 02/28/2021   PLT 213 02/28/2021      Chemistry      Component Value Date/Time   NA 138 02/28/2021 0932   NA 137 01/29/2021 0000   NA 138 02/09/2017 1213   K 4.5 02/28/2021  0932   K 3.9 02/09/2017 1213   CL 106 02/28/2021 0932   CO2 22 02/28/2021 0932   CO2 27 02/09/2017 1213   BUN 18 02/28/2021 0932   BUN 19 01/29/2021 0000   BUN 12.0 02/09/2017 1213   CREATININE 1.06 (H) 02/28/2021 0932   CREATININE 0.9 02/09/2017 1213   GLU 101 01/29/2021 0000      Component Value Date/Time   CALCIUM 8.9 02/28/2021 0932   CALCIUM 8.7 02/09/2017 1213   ALKPHOS 58 02/28/2021 0932   ALKPHOS 62 02/09/2017 1213   AST 20 02/28/2021 0932   AST 24 02/09/2017 1213   ALT 16 02/28/2021 0932   ALT 18 02/09/2017 1213   BILITOT 0.5 02/28/2021 0932   BILITOT 0.34 02/09/2017 1213      No results for input(s): INR in the last 168 hours.  Urinalysis    Component Value Date/Time   COLORURINE STRAW (A) 03/05/2016 1350    STUDIES:  NM PET Image Restag (PS) Skull Base To Thigh  Result Date: 02/21/2021 CLINICAL DATA:  Subsequent treatment strategy for restaging of right-sided breast cancer. EXAM: NUCLEAR MEDICINE PET SKULL BASE TO THIGH TECHNIQUE: 8.6 mCi F-18 FDG was injected intravenously. Full-ring PET imaging was performed from the skull base to thigh after the radiotracer. CT data was obtained and used for attenuation correction and anatomic localization. Fasting blood glucose: 120 mg/dl COMPARISON:  01/03/2020 FINDINGS: Mediastinal blood pool activity: SUV max 2.7 Liver activity: SUV max NA NECK: No cervical nodal hypermetabolism. Diffuse thyroid hypermetabolism at a S.U.V. max of 4.8  is similar to on the prior. Incidental CT findings: No cervical adenopathy. CHEST: No thoracic nodal or pulmonary parenchymal hypermetabolism identified. A subcutaneous left chest/medial breast 5 mm nodule corresponds to hypermetabolism at a S.U.V. max of 1.8 on 57/4 and is similar to on the prior exam. Incidental CT findings: Aortic and coronary artery calcification. Right mastectomy. ABDOMEN/PELVIS: No abdominopelvic parenchymal or nodal hypermetabolism. Incidental CT findings: Normal adrenal  glands. Subcentimeter hypoattenuating right liver lobe lesions are unchanged and not FDG avid. Dependent gallstones. Normal noncontrast appearance of the pancreas, kidneys, uterus. Degraded evaluation of the pelvis, secondary to beam hardening artifact from left hip arthroplasty. Extensive colonic diverticulosis. Aortic atherosclerosis. Moderate pelvic floor laxity. SKELETON: No abnormal marrow activity. Incidental CT findings: Diffuse sclerotic osseous metastasis. Cervical spine fixation. IMPRESSION: 1. No FDG avid soft tissue metastasis identified. 2. Diffuse sclerotic osseous metastasis, without FDG affinity. 3. Similar left chest wall or medial left breast subcutaneous 5 mm nodule with low-level hypermetabolism, favored to be benign and incidental. 4. Diffuse thyroid hypermetabolism can be seen with thyroiditis. This could be correlated with thyroid function tests. 5. Incidental findings, including: Cholelithiasis. Coronary artery atherosclerosis. Aortic Atherosclerosis (ICD10-I70.0). Pelvic floor laxity. Electronically Signed   By: Abigail Miyamoto M.D.   On: 02/21/2021 17:27     ASSESSMENT: 75 y.o. 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 Duration?)  (e) tamoxifen for 5 years completed 2002  (f) participation in a vaccine study at [facility] 2003 (CEA primed dendritic cells, HER-2 primed dendritic cells)  (2) status post right iliac crest biopsy 10/10/2013 for invasive ductal carcinoma, grade 2, estrogen and progesterone receptor positive  (3)  zoledronate started 10/12/2013,  repeated every 12 weeks  (a) dexa scan 07/28/2013 normal  (b) zolendronate discontinued after January 2019 dose  (4) biopsy of a scalp lesion 10/17/2013 showed metastatic breast cancer, HER-2/neu negative  (a) biopsy of a right anterior chest wall nodule August 2017 shows adenocarcinoma  (5) anastrozole started 10/28/2013, changed to letrozole 06/27/2016  (a) measurable disease = subcutaneous nodules in scalp and LLQ abdomen  (b)  CA-27-29 is informative  (c)  Repeat PET scan 12/28/2014 shows continuing response ; exam shows resolution of scalp lesions  (d) new skin lesion removed from right anterior chest wall August 2017  (e) status post radiation to the anterior lower right chest wall completed 02/25/2017 (20 sessions)   (f) PET scan 04/04/2016 shows 2 new foci of metabolic activity (T8 and manubrium)  (g) palbociclib added 04/21/2016 at 125 mg 21/7  (h) palbociclib dose dropped to 100 mg 21/7 starting with the March 2018 cycle  (i) bone scan 09/26/2016 is stable  (j) PET scan 02/03/2017 shows her bone metastases, and in addition a 0.5 cm right middle lobe nodule which may be new  (k) MRI of the cervical spine shows collapse of the left lateral mass of C3.  There is no soft tissue mass or focal neural impingement.  (l) palbociclib reduced to 75 mg as of September 2018  (m) palbociclib held during cervical spine irradiation finished in mid February, 2019  (n) letrozole discontinued February 2019 secondary  to likely progression   (6) neoplasia associated pain  (a) painful blastic lesion left femoral intertrochanteric region: Status post radiation completed November 2015  (b) pain left knee, status post TKR remotely  (c) left total hip replacement 03/12/2016  (7) palliative radiation to the cervical spine completed 05/08/2017 in [place]  (8) fulvestrant started 05/04/2017  (a) Palbociclib resumed 06/29/2017, currently at 75 mg every other day, 21 days on  7 days off  (9) denosumab/Xgeva started 06/01/2017, repeated every 28 days  (10) follow-up/staging studies  (a) PET scan 08/11/2017 shows no evidence for metabolically active tumor.  Sclerotic bone mets do not show hypermetabolism  (b) PET scan 02/11/2018 shows continuing excellent tumor control  (c) PET scan 08/05/2018 shows same bone lesions but no active disease  (d) PET scan 01/18/2019 finds stable disease  (e) PET scan 01/03/2020 finds no evidence of disease activity.  (11) history of recurrent transient ischemic attacks, most recently June 2022, currently on aspirin and Plavix for prevention, with further neurologic evaluation pending   PLAN: Hunter is now 15 years out from initial diagnosis of metastatic breast cancer.  Her disease is very well controlled on her current treatment and we are making no changes there.  Since I am retiring this is a good opportunity for her to return to Dr. Hinton Rao in [place].  We are placing that referral and hopefully she can be seen the same day as her next lab work there which will be 03/26/2021.  We will be glad to see Emilina in [place] at any point in the future if and when that would be advisable but at this point we are making no further routine appointments for her in this office  Total encounter time 25 minutes.Sarajane Jews C. Dezaray Shibuya, MD 02/28/21 10:29 AM Medical Oncology and Hematology [facility] [place], [place] [address] Tel. [phone]    Fax. [phone]   I, Wilburn Mylar, am acting as scribe for Dr. Virgie Dad. Marnita Poirier.  I, Lurline Del MD, have reviewed the above documentation for accuracy and completeness, and I agree with the above.   *Total Encounter Time as defined by the Centers for Medicare and Medicaid Services includes, in addition to the face-to-face time of a patient visit (documented in the note above) non-face-to-face time: obtaining and reviewing outside history, ordering  and reviewing medications, tests or procedures, care coordination (communications with other health care professionals or caregivers) and documentation in the medical record.

## 2021-02-28 ENCOUNTER — Inpatient Hospital Stay: Payer: Medicare Other

## 2021-02-28 ENCOUNTER — Telehealth: Payer: Self-pay | Admitting: *Deleted

## 2021-02-28 ENCOUNTER — Other Ambulatory Visit: Payer: Self-pay | Admitting: *Deleted

## 2021-02-28 ENCOUNTER — Inpatient Hospital Stay: Payer: Medicare Other | Attending: Oncology

## 2021-02-28 ENCOUNTER — Inpatient Hospital Stay (HOSPITAL_BASED_OUTPATIENT_CLINIC_OR_DEPARTMENT_OTHER): Payer: Medicare Other | Admitting: Oncology

## 2021-02-28 ENCOUNTER — Other Ambulatory Visit: Payer: Self-pay

## 2021-02-28 VITALS — BP 131/59 | HR 62 | Temp 97.6°F | Resp 18 | Ht 64.0 in | Wt 177.1 lb

## 2021-02-28 DIAGNOSIS — C50811 Malignant neoplasm of overlapping sites of right female breast: Secondary | ICD-10-CM | POA: Insufficient documentation

## 2021-02-28 DIAGNOSIS — C7951 Secondary malignant neoplasm of bone: Secondary | ICD-10-CM

## 2021-02-28 DIAGNOSIS — C50919 Malignant neoplasm of unspecified site of unspecified female breast: Secondary | ICD-10-CM

## 2021-02-28 DIAGNOSIS — Z79899 Other long term (current) drug therapy: Secondary | ICD-10-CM | POA: Insufficient documentation

## 2021-02-28 DIAGNOSIS — Z170 Estrogen receptor positive status [ER+]: Secondary | ICD-10-CM

## 2021-02-28 DIAGNOSIS — Z5111 Encounter for antineoplastic chemotherapy: Secondary | ICD-10-CM | POA: Insufficient documentation

## 2021-02-28 DIAGNOSIS — C50911 Malignant neoplasm of unspecified site of right female breast: Secondary | ICD-10-CM

## 2021-02-28 DIAGNOSIS — C50912 Malignant neoplasm of unspecified site of left female breast: Secondary | ICD-10-CM

## 2021-02-28 LAB — COMPREHENSIVE METABOLIC PANEL
ALT: 16 U/L (ref 0–44)
AST: 20 U/L (ref 15–41)
Albumin: 4.2 g/dL (ref 3.5–5.0)
Alkaline Phosphatase: 58 U/L (ref 38–126)
Anion gap: 10 (ref 5–15)
BUN: 18 mg/dL (ref 8–23)
CO2: 22 mmol/L (ref 22–32)
Calcium: 8.9 mg/dL (ref 8.9–10.3)
Chloride: 106 mmol/L (ref 98–111)
Creatinine, Ser: 1.06 mg/dL — ABNORMAL HIGH (ref 0.44–1.00)
GFR, Estimated: 54 mL/min — ABNORMAL LOW (ref >60)
Glucose, Bld: 84 mg/dL (ref 70–99)
Potassium: 4.5 mmol/L (ref 3.5–5.1)
Sodium: 138 mmol/L (ref 135–145)
Total Bilirubin: 0.5 mg/dL (ref 0.3–1.2)
Total Protein: 6.6 g/dL (ref 6.5–8.1)

## 2021-02-28 LAB — CBC WITH DIFFERENTIAL/PLATELET
Abs Immature Granulocytes: 0.01 10*3/uL (ref 0.00–0.07)
Basophils Absolute: 0.1 10*3/uL (ref 0.0–0.1)
Basophils Relative: 2 %
Eosinophils Absolute: 0.1 10*3/uL (ref 0.0–0.5)
Eosinophils Relative: 3 %
HCT: 37.8 % (ref 36.0–46.0)
Hemoglobin: 12.6 g/dL (ref 12.0–15.0)
Immature Granulocytes: 0 %
Lymphocytes Relative: 30 %
Lymphs Abs: 1 10*3/uL (ref 0.7–4.0)
MCH: 30.4 pg (ref 26.0–34.0)
MCHC: 33.3 g/dL (ref 30.0–36.0)
MCV: 91.1 fL (ref 80.0–100.0)
Monocytes Absolute: 0.4 10*3/uL (ref 0.1–1.0)
Monocytes Relative: 12 %
Neutro Abs: 1.8 10*3/uL (ref 1.7–7.7)
Neutrophils Relative %: 53 %
Platelets: 213 10*3/uL (ref 150–400)
RBC: 4.15 MIL/uL (ref 3.87–5.11)
RDW: 14.6 % (ref 11.5–15.5)
WBC: 3.4 10*3/uL — ABNORMAL LOW (ref 4.0–10.5)
nRBC: 0 % (ref 0.0–0.2)

## 2021-02-28 MED ORDER — DENOSUMAB 120 MG/1.7ML ~~LOC~~ SOLN
120.0000 mg | Freq: Once | SUBCUTANEOUS | Status: AC
  Administered 2021-02-28: 120 mg via SUBCUTANEOUS
  Filled 2021-02-28: qty 1.7

## 2021-02-28 MED ORDER — FULVESTRANT 250 MG/5ML IM SOSY
500.0000 mg | PREFILLED_SYRINGE | Freq: Once | INTRAMUSCULAR | Status: AC
  Administered 2021-02-28: 500 mg via INTRAMUSCULAR
  Filled 2021-02-28: qty 10

## 2021-02-28 NOTE — Telephone Encounter (Signed)
Referral placed for transfer of care per MD retirement to Dr Hinton Rao in [place].  Of note pt is currently established for labs and injections at their office- request placed for transfer of care with next lab/injection appt.

## 2021-03-01 ENCOUNTER — Telehealth: Payer: Self-pay | Admitting: Oncology

## 2021-03-01 LAB — CANCER ANTIGEN 27.29: CA 27.29: 25.9 U/mL (ref 0.0–38.6)

## 2021-03-01 LAB — THYROID PANEL WITH TSH
Free Thyroxine Index: 2.6 (ref 1.2–4.9)
T3 Uptake Ratio: 32 % (ref 24–39)
T4, Total: 8 ug/dL (ref 4.5–12.0)
TSH: 4.59 u[IU]/mL — ABNORMAL HIGH (ref 0.450–4.500)

## 2021-03-01 NOTE — Telephone Encounter (Signed)
Scheduled appt per 12/8 referral. Pt is aware of appt date and time.

## 2021-03-04 ENCOUNTER — Encounter: Payer: Self-pay | Admitting: Oncology

## 2021-03-05 DIAGNOSIS — M79662 Pain in left lower leg: Secondary | ICD-10-CM | POA: Diagnosis not present

## 2021-03-05 DIAGNOSIS — M7989 Other specified soft tissue disorders: Secondary | ICD-10-CM | POA: Diagnosis not present

## 2021-03-05 DIAGNOSIS — C50911 Malignant neoplasm of unspecified site of right female breast: Secondary | ICD-10-CM | POA: Diagnosis not present

## 2021-03-05 DIAGNOSIS — I10 Essential (primary) hypertension: Secondary | ICD-10-CM | POA: Diagnosis not present

## 2021-03-11 DIAGNOSIS — M79605 Pain in left leg: Secondary | ICD-10-CM | POA: Diagnosis not present

## 2021-03-11 DIAGNOSIS — M7989 Other specified soft tissue disorders: Secondary | ICD-10-CM | POA: Diagnosis not present

## 2021-03-11 DIAGNOSIS — M79662 Pain in left lower leg: Secondary | ICD-10-CM | POA: Diagnosis not present

## 2021-03-19 ENCOUNTER — Encounter: Payer: Self-pay | Admitting: Oncology

## 2021-03-21 ENCOUNTER — Other Ambulatory Visit: Payer: Self-pay | Admitting: Oncology

## 2021-03-22 NOTE — Progress Notes (Signed)
Jade  [address] [place],  [place]  [address] [phone]  Clinic Day:  03/26/2021  Referring physician: Venetia [place], Jade [place], *  This document serves as a record of services personally performed by Jade Poisson, MD. It was created on their behalf by [place], a trained medical scribe. The creation of this record is based on the scribe's personal observations and the provider's statements to them.  ASSESSMENT & PLAN:   Invasive ductal carcinoma of the breast, originally diagnosed in 1997 and treated with right mastectomy. She was treated with high dose chemotherapy followed by stem cell transplantation. She completed 5 years of tamoxifen in November 2002.  She later developed liver metastases, ER and HER2 positive, November 2007. She was treated with Herceptin.  Bone metastases and subcutaneous nodules found in November 2007. Treated with surgery, radiation, and hormonal therapy.   Scalp lesions which were confirmed to be metastatic breast cancer, but HER2 negative in 2015. She was placed on anastrozole in 2015 and changed to letrozole in 2018. At that time she had multiple lytic and sclerotic bone metastases.  She had a metastatic lesion of the right anterior chest in August 2017 confirmed to be adenocarcinoma and treated with excision and radiation.  Mass of the lateral C3 vertebral body with collapse treated with radiation in February 2019. At that time the letrozole was stopped and she was placed on fulvestrant injections.  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 Her doses had to be adjusted and she is now down to 75 mg every other day for 21 days on and 7 days off. She continues the fulvestrant every 28 days as well as denosumab every 28 days. She has been stable on this regimen for nearly 5 years now.  Generalized osteoarthritis.   Hypothyroidism, which has been evaluated with an  ultrasound of the thyroid and she is on supplement.  Hard mass of the posterior left knee with induration and swelling of the calf. She has had a negative Doppler for DVT and has no evidence of a Baker's cyst. This does not appear to be infectious as there is no heat or fluctuance, and feel this may represent metastatic breast cancer. This could be edema but is quite indurated. She agrees with evaluating this further as it is a new finding. I will order an MRI of the knee.   This is a pleasant 75 year old female with widely metastatic bone metastases, but no organ involvement at this time. She remains stable on the current regimen of fulvestrant, Xgeva and palbociclib, so I recommend that we continue this the same. However, she has a new finding in the left lower leg and we will evaluate this further with an MRI of the knee and soft tissue. This may require a biopsy for further evaluation. If this is an isolated area of breast cancer progression, we could treat it with localized radiotherapy and continue her current systemic treatment. She did request sedation for the MRI scan and so I will prescribe Ativan 1 mg to use as needed. I will contact her regarding the results of the MRI and she may need to come back for further discussion. She is already scheduled for her January 5th injections. If all is well, she will be due for her next labs on January 31st and injections on February 2nd and again 4 weeks later. I will see her in 12 weeks with CBC, CMP and CA 27.29 for repeat  evaluation. She and her husband understand and agree with this plan of care. They know to call with any concerns regarding her breast cancer.  Thank you for the opportunity to participate in the care of your patients  I provided 60 minutes of face-to-face time during this this encounter and > 50% was spent counseling as documented under my assessment and plan.    Jade [place], MD [facility] AT [facility] [address] [place] [address] Dept: [phone] Dept Fax: [phone]   CHIEF COMPLAINT:  CC: Metastatic breast cancer  Current Treatment:  Fulvestrant, Delton See and palbociclib   HISTORY OF PRESENT ILLNESS:  Jade [place] is a 75 y.o. female who presents as a transfer of care from Dr. Lurline [place] for the continued treatment and management of metastatic breast cancer. She was originally diagnosed in 1997, stage III, and underwent right mastectomy. She did receive adjuvant chemotherapy with Taxotere for 4 cycles, and also participated in a [facility] protocol with high-dose chemotherapy, cyclophosphamide/carboplatin/BCNU, followed by stem cell rescue in September 1997. She completed 5 years of tamoxifen in November 2002.  Unfortunately, she developed metastatic liver lesions which were biopsy proven metastatic breast cancer in November 2007. However, the tumor was estrogen receptor and HER2 positive, and so she was treated with Herceptin for a period of time.   In July 2015, she was found to have incidental findings of mixed lytic and sclerotic bony lesions on CT abdomen and pelvis. She underwent biopsy of the right iliac bone and surgical pathology confirmed invasive ductal carcinoma, grade 2, estrogen and progesterone receptor positive. Bone density from May 2015 was normal. She was started on zolendronic acid every 12 weeks in July 2015, but this was discontinued after January 2019. She was also found to have three scalp lesions, biopsied in July 2015 confirming metastatic breast cancer. HER2 negative. She was started on anastrozole in August 2015, and was switched to letrozole in April 2018. However, this was discontinued in February 2019 due to progression. She was started on fulvestrant in February 2019.  She developed a skin lesion of the right anterior chest wall in July 2015, and biopsy revealed adenocarcinoma. This was resected in August 2017, and  she received radiation, completed in December 2018. Palbociclib was added in January 2018 after PET imaging revealed 2 new foci at T8 and the manubrium. She received palliative radiation to the cervical spine in February 2019. Palbociclib was held during that time and resumed at 75 mg daily once completed. Delton See was started in March 2019.   INTERVAL HISTORY:  I have reviewed her chart and materials related to her cancer extensively and collaborated history with the patient. Summary of oncologic history is as follows: Oncology History  Breast cancer metastasized to bone ([place])  10/12/2013 Initial Diagnosis   Breast cancer metastasized to bone ([place])   02/02/2020 -  Chemotherapy   Patient is on Treatment Plan : BREAST FULVESTRANT & XGEVA Q28D     Metastatic breast cancer ([place])  07/12/2015 Initial Diagnosis   Metastatic breast cancer ([place])   02/02/2020 -  Chemotherapy   Patient is on Treatment Plan : BREAST FULVESTRANT & XGEVA Q28D       Jade [place] is here for transfer of care. PET imaging from December 1st 2022 revealed no FDG-avid soft tissue metastasis; diffuse sclerotic osseous metastasis, without FDG affinity; similar left chest wall or medial left breast subcutaneous 5 mm nodule with low-level hypermetabolism, favored benign and incidental.  She continues palbociclib 75 mg every other day for 21 days on and 7 days off without difficulty. She obtains this through the outpatient [facility] pharmacy and has 1 box left. She continues fulvestrant every 28 days since February 2019 and Xgeva without difficulty as well. She does complain of chronic pain of bilateral legs which she rates as a 4/5 and generalized arthritis, even involving her hands and feet. She uses Tylenol arthritis for this and testing has been negative for rheumatoid arthritis. She has had a recent swelling of her left leg at the popliteal fossa with induration inferior to that of the calf. She did have a Doppler which was negative for deep  venous thrombosis. This is mildly painful and needs to be evaluated further. Dr. Jana Hakim has been monitoring her CA 27-29 which was 28.2 in September and down to 25.9 on December 8th. It has remained in the normal range for a long time now. Her white count was stable at 3.1 with an ANC of 1.46, just slightly lower than previous. The rest of her CBC and CMP are unremarkable and her calcium is good at 8.8. Her  appetite is good, and she has gained 3 pounds.  She denies fever, chills or other signs of infection.  She denies nausea, vomiting, bowel issues, or abdominal pain.  She denies sore throat, cough, dyspnea, or chest pain.  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[facility])    TIA (transient ischemic attack) last 11-08-15, 09/10/20   x 2 total    Past Surgical History:  Procedure Laterality Date   CATARACT EXTRACTION, BILATERAL     MASTECTOMY MODIFIED RADICAL Right 1997   porta cath insertion  1997   later removed   radiation tx  finished 02-25-16   x 20 tx   TONSILLECTOMY     TOTAL HIP ARTHROPLASTY Left 03/12/2016   Procedure: LEFT TOTAL HIP ARTHROPLASTY ANTERIOR APPROACH;  Surgeon: Gaynelle Arabian, MD;  Location: [facility];  Service: Orthopedics;  Laterality: Left;   TOTAL KNEE ARTHROPLASTY Left    TUBAL LIGATION      Family History  Problem Relation Age of Onset   Breast cancer Maternal Aunt     Social History:  reports that she has never smoked. She has never used smokeless tobacco. She reports current alcohol use. She reports that she does not use drugs.The patient is accompanied by her husband Jade [place] today. She is married and  lives at home with her spouse. They are planning a cruise. She is retired from work, and has never been exposed to chemicals or other toxic agents.  Allergies:  Allergies  Allergen Reactions   Amoxicillin Hives    Has patient had a PCN reaction causing immediate rash, facial/tongue/throat swelling, SOB or lightheadedness with hypotension:unsure Has patient had a PCN reaction causing severe rash involving mucus membranes or skin necrosis:No Has patient had a PCN reaction that required hospitalization:No Has patient had a PCN reaction occurring within the last 10 years: Yes If all of the above answers are "NO", then may proceed with Cephalosporin use.  Erythromycin Other (See Comments)   Oxycodone    Oxycodone Hcl    Percocet [Oxycodone-Acetaminophen] Nausea And Vomiting   Percodan [Oxycodone-Aspirin] Nausea And Vomiting   Tramadol Itching    Current Medications: Current Outpatient Medications  Medication Sig Dispense Refill   acetaminophen (TYLENOL) 500 MG tablet Take 1,000 mg by mouth every 6 (six) hours as needed for mild pain. Reported on 04/17/2015     ascorbic acid (VITAMIN C) 1000 MG tablet Take 1,000 mg by mouth daily.     beta carotene 10000 UNIT capsule Take 10,000 Units by mouth daily.     calcium citrate-vitamin D (CITRACAL+D) 315-200 MG-UNIT tablet Take 1 tablet by mouth in the morning and at bedtime.     cholecalciferol (VITAMIN D) 1000 units tablet Take 1,000 Units by mouth daily.     clopidogrel (PLAVIX) 75 MG tablet Take 1 tablet (75 mg total) by mouth daily.     cyanocobalamin (,VITAMIN B-12,) 1000 MCG/ML injection Inject 1,000 mcg into the muscle once.     Denosumab (XGEVA [place]) Inject into the skin.     famotidine (PEPCID) 40 MG tablet Take 1 tablet (40 mg total) by mouth 2 (two) times daily.     Fulvestrant (FASLODEX IM) Inject 500 mg into the muscle.     glucosamine-chondroitin 500-400 MG tablet Take 1 tablet by mouth daily.     hydrocortisone (ANUSOL-HC) 2.5 %  rectal cream APPLY TO AFFECTED AREA TWICE A DAY     ketotifen (ZADITOR) 0.025 % ophthalmic solution 1 drop 2 (two) times daily.     levothyroxine (SYNTHROID, LEVOTHROID) 88 MCG tablet Take 88 mcg by mouth daily before breakfast.     LORazepam (ATIVAN) 1 MG tablet Take 1 tablet (1 mg total) by mouth every 8 (eight) hours as needed for anxiety. 10 tablet 0   Lutein 20 MG CAPS Take 20 mg by mouth daily.     Magnesium (RA NATURAL MAGNESIUM) 250 MG TABS Take by mouth.     meclizine (ANTIVERT) 25 MG tablet Take 25 mg by mouth 3 (three) times daily as needed for dizziness.     metroNIDAZOLE (METROCREAM) 0.75 % cream metronidazole 0.75 % topical cream  APPLY ONE APPLICATION 2X DAILY TO FACE AS DIRECTED     Multiple Vitamins-Minerals (MULTIVITAMIN WITH MINERALS) tablet Take 1 tablet by mouth daily.     mupirocin ointment (BACTROBAN) 2 % 2 (two) times daily.     Omega-3 1000 MG CAPS Take 300-1,000 mg by mouth in the morning and at bedtime.     palbociclib (IBRANCE) 75 MG capsule Take 1 capsule (75 mg total) by mouth daily with breakfast. Take whole with food on days 1-21, every 28 days. 21 capsule 6   pantoprazole (PROTONIX) 40 MG tablet Take 40 mg by mouth 2 (two) times daily. Before breakfast and before supper     Polyethyl Glycol-Propyl Glycol 0.4-0.3 % SOLN Place 1-2 drops into both eyes 2 (two) times daily.     prednisoLONE acetate (PRED FORTE) 1 % ophthalmic suspension Place 1 drop into both eyes daily.     rOPINIRole (REQUIP) 1 MG tablet Take 1-2 mg by mouth at bedtime.     rOPINIRole (REQUIP) 2 MG tablet ropinirole 2 mg tablet     rosuvastatin (CRESTOR) 20 MG tablet Take 1 tablet (20 mg total) by mouth daily.     SHINGRIX injection      Specialty Vitamins Products (ELON MATRIX 5000) TABS Take 5,000 mcg by mouth daily.  vitamin [place] 180 MG (400 UNITS) capsule Take 180 Units by mouth daily.     zinc gluconate 50 MG tablet Take 50 mg by mouth daily.     No current facility-administered medications  for this visit.    REVIEW OF SYSTEMS:  Review of Systems  Constitutional: Negative.  Negative for appetite change, chills, fatigue, fever and unexpected weight change.  HENT:  Negative.    Eyes: Negative.   Respiratory: Negative.  Negative for chest tightness, cough, hemoptysis, shortness of breath and wheezing.   Cardiovascular:  Positive for leg swelling (of the left medial leg near the popliteal fossa). Negative for chest pain and palpitations.  Gastrointestinal: Negative.  Negative for abdominal distention, abdominal pain, blood in stool, constipation, diarrhea, nausea and vomiting.  Endocrine: Negative.   Genitourinary: Negative.  Negative for difficulty urinating, dysuria, frequency and hematuria.   Musculoskeletal:  Positive for arthralgias (diffuse). Negative for back pain, flank pain, gait problem and myalgias.  Skin: Negative.   Neurological: Negative.  Negative for dizziness, extremity weakness, gait problem, headaches, light-headedness, numbness, seizures and speech difficulty.  Hematological: Negative.   Psychiatric/Behavioral: Negative.  Negative for depression and sleep disturbance. The patient is not nervous/anxious.      VITALS:  Blood pressure (!) 156/82, pulse 63, temperature 97.9 F (36.6 C), temperature source Oral, resp. rate 18, height _0  (1.626 m), weight 180 lb 1.6 oz (81.7 kg), SpO2 97 %.  Wt Readings from Last 3 Encounters:  03/28/21 179 lb 1.3 oz (81.2 kg)  03/26/21 180 lb 1.6 oz (81.7 kg)  02/28/21 177 lb 1.6 oz (80.3 kg)    Body mass index is 30.91 kg/m.  Performance status (ECOG): 1 - Symptomatic but completely ambulatory  PHYSICAL EXAM:  Physical Exam Constitutional:      General: She is not in acute distress.    Appearance: Normal appearance. She is normal weight.  HENT:     Head: Normocephalic and atraumatic.  Eyes:     General: No scleral icterus.    Extraocular Movements: Extraocular movements intact.     Conjunctiva/sclera:  Conjunctivae normal.     Pupils: Pupils are equal, round, and reactive to light.  Cardiovascular:     Rate and Rhythm: Normal rate and regular rhythm.     Pulses: Normal pulses.     Heart sounds: Normal heart sounds. No murmur heard.   No friction rub. No gallop.  Pulmonary:     Effort: Pulmonary effort is normal. No respiratory distress.     Breath sounds: Normal breath sounds.  Chest:     Comments: Right mastectomy is negative and left breast is without masses. Abdominal:     General: Bowel sounds are normal. There is no distension.     Palpations: Abdomen is soft. There is no hepatomegaly, splenomegaly or mass.     Tenderness: There is no abdominal tenderness.  Musculoskeletal:        General: Normal range of motion.     Cervical back: Normal range of motion and neck supple.     Right lower leg: No edema.     Left lower leg: No edema.     Comments: Large nodules of her PIP joints of the hands consistent with advanced osteoarthritis. Left leg has induration of the calf with a mass effect of the lower popliteal fossa. There is just minimal erythema. This is not hot or fluctuant.   Lymphadenopathy:     Cervical: No cervical adenopathy.  Skin:    General: Skin  is warm and dry.  Neurological:     General: No focal deficit present.     Mental Status: She is alert and oriented to person, place, and time. Mental status is at baseline.  Psychiatric:        Mood and Affect: Mood normal.        Behavior: Behavior normal.        Thought Content: Thought content normal.        Judgment: Judgment normal.     LABS:   CBC Latest Ref Rng & Units 03/26/2021 02/28/2021 01/29/2021  WBC - 3.1 3.4(L) 3.0  Hemoglobin 12.0 - 16.0 13.0 12.6 12.4  Hematocrit 36 - 46 39 37.8 37  Platelets 150 - 399 202 213 191   CMP Latest Ref Rng & Units 03/26/2021 02/28/2021 01/29/2021  Glucose 70 - 99 mg/dL - 84 -  BUN 4 - _0 Creatinine 0.5 - 1.1 0.8 1.06(H) 0.8  Sodium 137 - 147 136(A) 138 137   Potassium 3.4 - 5.3 4.4 4.5 4.4  Chloride 99 - 108 103 106 104  CO2 13 - 22 24(A) 22 22  Calcium 8.7 - 10.7 8.8 8.9 8.6(A)  Total Protein 6.5 - 8.1 g/dL - 6.6 -  Total Bilirubin 0.3 - 1.2 mg/dL - 0.5 -  Alkaline Phos 25 - 125 63 58 56  AST 13 - 35 _1 ALT 7 - 35 _2 Lab Results  Component Value Date   LDH 138 01/17/2011   LDH 155 12/28/2009   LDH 155 12/29/2008    STUDIES:  No results found.   EXAM: 09/11/2020 CTA HEAD AND NECK  IMPRESSION: No intracranial stenosis or large vessel occlusion No significant carotid or vertebral artery stenosis Moderate chronic microvascular ischemic change in the white matter Widespread metastatic disease to the skeleton  EXAM: 09/11/2020 MRI HEAD WITHOUT AND WITH CONTRAST  IMPRESSION: No acute finding or cause for symptoms. Negative for metastatic disease. Moderate chronic small vessel ischemia in the cerebral white matter  EXAM: 03/11/2021 LEFT LOWER EXTREMITY VENOUS DOPPLER ULTRASOUND  IMPRESSION: No evidence of deep venous thrombosis  I, Rita Ohara, am acting as scribe for Jade [place], MD  I have reviewed this report as typed by the medical scribe, and it is complete and accurate.

## 2021-03-25 ENCOUNTER — Other Ambulatory Visit: Payer: Self-pay | Admitting: Oncology

## 2021-03-25 DIAGNOSIS — C7951 Secondary malignant neoplasm of bone: Secondary | ICD-10-CM

## 2021-03-25 DIAGNOSIS — C50919 Malignant neoplasm of unspecified site of unspecified female breast: Secondary | ICD-10-CM

## 2021-03-26 ENCOUNTER — Other Ambulatory Visit: Payer: Self-pay | Admitting: Oncology

## 2021-03-26 ENCOUNTER — Other Ambulatory Visit: Payer: Self-pay

## 2021-03-26 ENCOUNTER — Inpatient Hospital Stay (INDEPENDENT_AMBULATORY_CARE_PROVIDER_SITE_OTHER): Payer: Medicare Other | Admitting: Oncology

## 2021-03-26 ENCOUNTER — Inpatient Hospital Stay: Payer: Medicare Other | Attending: Oncology

## 2021-03-26 ENCOUNTER — Other Ambulatory Visit: Payer: Self-pay | Admitting: Hematology and Oncology

## 2021-03-26 ENCOUNTER — Encounter: Payer: Self-pay | Admitting: Oncology

## 2021-03-26 VITALS — BP 156/82 | HR 63 | Temp 97.9°F | Resp 18 | Ht 64.0 in | Wt 180.1 lb

## 2021-03-26 DIAGNOSIS — Z5111 Encounter for antineoplastic chemotherapy: Secondary | ICD-10-CM | POA: Diagnosis not present

## 2021-03-26 DIAGNOSIS — C50919 Malignant neoplasm of unspecified site of unspecified female breast: Secondary | ICD-10-CM

## 2021-03-26 DIAGNOSIS — C7951 Secondary malignant neoplasm of bone: Secondary | ICD-10-CM | POA: Insufficient documentation

## 2021-03-26 DIAGNOSIS — C801 Malignant (primary) neoplasm, unspecified: Secondary | ICD-10-CM

## 2021-03-26 DIAGNOSIS — Z170 Estrogen receptor positive status [ER+]: Secondary | ICD-10-CM | POA: Diagnosis not present

## 2021-03-26 DIAGNOSIS — C50811 Malignant neoplasm of overlapping sites of right female breast: Secondary | ICD-10-CM | POA: Insufficient documentation

## 2021-03-26 DIAGNOSIS — F411 Generalized anxiety disorder: Secondary | ICD-10-CM

## 2021-03-26 DIAGNOSIS — D649 Anemia, unspecified: Secondary | ICD-10-CM | POA: Diagnosis not present

## 2021-03-26 LAB — BASIC METABOLIC PANEL
BUN: 15 (ref 4–21)
CO2: 24 — AB (ref 13–22)
Chloride: 103 (ref 99–108)
Creatinine: 0.8 (ref 0.5–1.1)
Glucose: 91
Potassium: 4.4 (ref 3.4–5.3)
Sodium: 136 — AB (ref 137–147)

## 2021-03-26 LAB — COMPREHENSIVE METABOLIC PANEL
Albumin: 4.4 (ref 3.5–5.0)
Calcium: 8.8 (ref 8.7–10.7)

## 2021-03-26 LAB — CBC AND DIFFERENTIAL
HCT: 39 (ref 36–46)
Hemoglobin: 13 (ref 12.0–16.0)
Neutrophils Absolute: 1.46
Platelets: 202 (ref 150–399)
WBC: 3.1

## 2021-03-26 LAB — HEPATIC FUNCTION PANEL
ALT: 21 (ref 7–35)
AST: 30 (ref 13–35)
Alkaline Phosphatase: 63 (ref 25–125)
Bilirubin, Total: 0.5

## 2021-03-26 LAB — CBC: RBC: 4.34 (ref 3.87–5.11)

## 2021-03-26 MED ORDER — LORAZEPAM 1 MG PO TABS: 1.0000 mg | ORAL_TABLET | Freq: Three times a day (TID) | ORAL | 0 refills | Status: DC | PRN

## 2021-03-27 DIAGNOSIS — J3489 Other specified disorders of nose and nasal sinuses: Secondary | ICD-10-CM | POA: Diagnosis not present

## 2021-03-27 LAB — CANCER ANTIGEN 27.29: CA 27.29: 26.7 U/mL (ref 0.0–38.6)

## 2021-03-28 ENCOUNTER — Encounter: Payer: Self-pay | Admitting: Oncology

## 2021-03-28 ENCOUNTER — Other Ambulatory Visit: Payer: Self-pay

## 2021-03-28 ENCOUNTER — Inpatient Hospital Stay: Payer: Medicare Other

## 2021-03-28 VITALS — BP 160/78 | HR 65 | Temp 98.2°F | Resp 18 | Ht 64.0 in | Wt 179.1 lb

## 2021-03-28 DIAGNOSIS — Z170 Estrogen receptor positive status [ER+]: Secondary | ICD-10-CM | POA: Diagnosis not present

## 2021-03-28 DIAGNOSIS — Z5111 Encounter for antineoplastic chemotherapy: Secondary | ICD-10-CM | POA: Diagnosis not present

## 2021-03-28 DIAGNOSIS — C7951 Secondary malignant neoplasm of bone: Secondary | ICD-10-CM | POA: Diagnosis not present

## 2021-03-28 DIAGNOSIS — C50919 Malignant neoplasm of unspecified site of unspecified female breast: Secondary | ICD-10-CM

## 2021-03-28 DIAGNOSIS — C50811 Malignant neoplasm of overlapping sites of right female breast: Secondary | ICD-10-CM | POA: Diagnosis not present

## 2021-03-28 MED ORDER — FULVESTRANT 250 MG/5ML IM SOSY
500.0000 mg | PREFILLED_SYRINGE | Freq: Once | INTRAMUSCULAR | Status: AC
  Administered 2021-03-28: 500 mg via INTRAMUSCULAR
  Filled 2021-03-28: qty 10

## 2021-03-28 MED ORDER — DENOSUMAB 120 MG/1.7ML ~~LOC~~ SOLN
120.0000 mg | Freq: Once | SUBCUTANEOUS | Status: AC
  Administered 2021-03-28: 120 mg via SUBCUTANEOUS
  Filled 2021-03-28: qty 1.7

## 2021-03-28 NOTE — Patient Instructions (Signed)
Denosumab injection What is this medication? DENOSUMAB (den oh sue mab) slows bone breakdown. Prolia is used to treat osteoporosis in women after menopause and in men, and in people who are taking corticosteroids for 6 months or more. Delton See is used to treat a high calcium level due to cancer and to prevent bone fractures and other bone problems caused by multiple myeloma or cancer bone metastases. Delton See is also used to treat giant cell tumor of the bone. This medicine may be used for other purposes; ask your health care provider or pharmacist if you have questions. COMMON BRAND NAME(S): Prolia, XGEVA What should I tell my care team before I take this medication? They need to know if you have any of these conditions: dental disease having surgery or tooth extraction infection kidney disease low levels of calcium or Vitamin D in the blood malnutrition on hemodialysis skin conditions or sensitivity thyroid or parathyroid disease an unusual reaction to denosumab, other medicines, foods, dyes, or preservatives pregnant or trying to get pregnant breast-feeding How should I use this medication? This medicine is for injection under the skin. It is given by a health care professional in a hospital or clinic setting. A special MedGuide will be given to you before each treatment. Be sure to read this information carefully each time. For Prolia, talk to your pediatrician regarding the use of this medicine in children. Special care may be needed. For Delton See, talk to your pediatrician regarding the use of this medicine in children. While this drug may be prescribed for children as young as 13 years for selected conditions, precautions do apply. Overdosage: If you think you have taken too much of this medicine contact a poison control center or emergency room at once. NOTE: This medicine is only for you. Do not share this medicine with others. What if I miss a dose? It is important not to miss your dose.  Call your doctor or health care professional if you are unable to keep an appointment. What may interact with this medication? Do not take this medicine with any of the following medications: other medicines containing denosumab This medicine may also interact with the following medications: medicines that lower your chance of fighting infection steroid medicines like prednisone or cortisone This list may not describe all possible interactions. Give your health care provider a list of all the medicines, herbs, non-prescription drugs, or dietary supplements you use. Also tell them if you smoke, drink alcohol, or use illegal drugs. Some items may interact with your medicine. What should I watch for while using this medication? Visit your doctor or health care professional for regular checks on your progress. Your doctor or health care professional may order blood tests and other tests to see how you are doing. Call your doctor or health care professional for advice if you get a fever, chills or sore throat, or other symptoms of a cold or flu. Do not treat yourself. This drug may decrease your body's ability to fight infection. Try to avoid being around people who are sick. You should make sure you get enough calcium and vitamin D while you are taking this medicine, unless your doctor tells you not to. Discuss the foods you eat and the vitamins you take with your health care professional. See your dentist regularly. Brush and floss your teeth as directed. Before you have any dental work done, tell your dentist you are receiving this medicine. Do not become pregnant while taking this medicine or for 5 months after  stopping it. Talk with your doctor or health care professional about your birth control options while taking this medicine. Women should inform their doctor if they wish to become pregnant or think they might be pregnant. There is a potential for serious side effects to an unborn child. Talk to  your health care professional or pharmacist for more information. What side effects may I notice from receiving this medication? Side effects that you should report to your doctor or health care professional as soon as possible: allergic reactions like skin rash, itching or hives, swelling of the face, lips, or tongue bone pain breathing problems dizziness jaw pain, especially after dental work redness, blistering, peeling of the skin signs and symptoms of infection like fever or chills; cough; sore throat; pain or trouble passing urine signs of low calcium like fast heartbeat, muscle cramps or muscle pain; pain, tingling, numbness in the hands or feet; seizures unusual bleeding or bruising unusually weak or tired Side effects that usually do not require medical attention (report to your doctor or health care professional if they continue or are bothersome): constipation diarrhea headache joint pain loss of appetite muscle pain runny nose tiredness upset stomach This list may not describe all possible side effects. Call your doctor for medical advice about side effects. You may report side effects to FDA at 1-800-FDA-1088. Where should I keep my medication? This medicine is only given in a clinic, doctor's office, or other health care setting and will not be stored at home. NOTE: This sheet is a summary. It may not cover all possible information. If you have questions about this medicine, talk to your doctor, pharmacist, or health care provider.  2022 Elsevier/Gold Standard (2017-07-17 00:00:00) Fulvestrant injection What is this medication? FULVESTRANT (ful VES trant) blocks the effects of estrogen. It is used to treat breast cancer. This medicine may be used for other purposes; ask your health care provider or pharmacist if you have questions. COMMON BRAND NAME(S): FASLODEX What should I tell my care team before I take this medication? They need to know if you have any of these  conditions: bleeding disorders liver disease low blood counts, like low white cell, platelet, or red cell counts an unusual or allergic reaction to fulvestrant, other medicines, foods, dyes, or preservatives pregnant or trying to get pregnant breast-feeding How should I use this medication? This medicine is for injection into a muscle. It is usually given by a health care professional in a hospital or clinic setting. Talk to your pediatrician regarding the use of this medicine in children. Special care may be needed. Overdosage: If you think you have taken too much of this medicine contact a poison control center or emergency room at once. NOTE: This medicine is only for you. Do not share this medicine with others. What if I miss a dose? It is important not to miss your dose. Call your doctor or health care professional if you are unable to keep an appointment. What may interact with this medication? medicines that treat or prevent blood clots like warfarin, enoxaparin, dalteparin, apixaban, dabigatran, and rivaroxaban This list may not describe all possible interactions. Give your health care provider a list of all the medicines, herbs, non-prescription drugs, or dietary supplements you use. Also tell them if you smoke, drink alcohol, or use illegal drugs. Some items may interact with your medicine. What should I watch for while using this medication? Your condition will be monitored carefully while you are receiving this medicine. You will need  important blood work done while you are taking this medicine. Do not become pregnant while taking this medicine or for at least 1 year after stopping it. Women of child-bearing potential will need to have a negative pregnancy test before starting this medicine. Women should inform their doctor if they wish to become pregnant or think they might be pregnant. There is a potential for serious side effects to an unborn child. Men should inform their doctors  if they wish to father a child. This medicine may lower sperm counts. Talk to your health care professional or pharmacist for more information. Do not breast-feed an infant while taking this medicine or for 1 year after the last dose. What side effects may I notice from receiving this medication? Side effects that you should report to your doctor or health care professional as soon as possible: allergic reactions like skin rash, itching or hives, swelling of the face, lips, or tongue feeling faint or lightheaded, falls pain, tingling, numbness, or weakness in the legs signs and symptoms of infection like fever or chills; cough; flu-like symptoms; sore throat vaginal bleeding Side effects that usually do not require medical attention (report to your doctor or health care professional if they continue or are bothersome): aches, pains constipation diarrhea headache hot flashes nausea, vomiting pain at site where injected stomach pain This list may not describe all possible side effects. Call your doctor for medical advice about side effects. You may report side effects to FDA at 1-800-FDA-1088. Where should I keep my medication? This drug is given in a hospital or clinic and will not be stored at home. NOTE: This sheet is a summary. It may not cover all possible information. If you have questions about this medicine, talk to your doctor, pharmacist, or health care provider.  2022 Elsevier/Gold Standard (2017-06-23 00:00:00)

## 2021-03-29 ENCOUNTER — Encounter: Payer: Self-pay | Admitting: Oncology

## 2021-03-29 DIAGNOSIS — C50919 Malignant neoplasm of unspecified site of unspecified female breast: Secondary | ICD-10-CM | POA: Diagnosis not present

## 2021-03-29 DIAGNOSIS — M898X6 Other specified disorders of bone, lower leg: Secondary | ICD-10-CM | POA: Diagnosis not present

## 2021-03-29 DIAGNOSIS — Z853 Personal history of malignant neoplasm of breast: Secondary | ICD-10-CM | POA: Diagnosis not present

## 2021-03-29 DIAGNOSIS — R600 Localized edema: Secondary | ICD-10-CM | POA: Diagnosis not present

## 2021-03-29 DIAGNOSIS — M25562 Pain in left knee: Secondary | ICD-10-CM | POA: Diagnosis not present

## 2021-04-01 ENCOUNTER — Encounter: Payer: Self-pay | Admitting: Oncology

## 2021-04-01 ENCOUNTER — Other Ambulatory Visit: Payer: Self-pay | Admitting: Oncology

## 2021-04-01 DIAGNOSIS — Z1231 Encounter for screening mammogram for malignant neoplasm of breast: Secondary | ICD-10-CM

## 2021-04-02 ENCOUNTER — Other Ambulatory Visit: Payer: Self-pay | Admitting: Oncology

## 2021-04-02 DIAGNOSIS — L03116 Cellulitis of left lower limb: Secondary | ICD-10-CM

## 2021-04-02 MED ORDER — CEPHALEXIN 500 MG PO CAPS: 500.0000 mg | ORAL_CAPSULE | Freq: Three times a day (TID) | ORAL | 0 refills | Status: DC

## 2021-04-03 DIAGNOSIS — D519 Vitamin B12 deficiency anemia, unspecified: Secondary | ICD-10-CM | POA: Diagnosis not present

## 2021-04-08 ENCOUNTER — Encounter: Payer: Self-pay | Admitting: Oncology

## 2021-04-23 ENCOUNTER — Other Ambulatory Visit: Payer: Self-pay

## 2021-04-23 ENCOUNTER — Other Ambulatory Visit: Payer: Self-pay | Admitting: Hematology and Oncology

## 2021-04-23 ENCOUNTER — Inpatient Hospital Stay: Payer: Medicare Other

## 2021-04-23 DIAGNOSIS — E782 Mixed hyperlipidemia: Secondary | ICD-10-CM | POA: Diagnosis not present

## 2021-04-23 DIAGNOSIS — C50919 Malignant neoplasm of unspecified site of unspecified female breast: Secondary | ICD-10-CM

## 2021-04-23 DIAGNOSIS — D649 Anemia, unspecified: Secondary | ICD-10-CM | POA: Diagnosis not present

## 2021-04-23 DIAGNOSIS — I10 Essential (primary) hypertension: Secondary | ICD-10-CM | POA: Diagnosis not present

## 2021-04-23 LAB — HEPATIC FUNCTION PANEL
ALT: 20 (ref 7–35)
AST: 33 (ref 13–35)
Alkaline Phosphatase: 46 (ref 25–125)
Bilirubin, Total: 0.6

## 2021-04-23 LAB — BASIC METABOLIC PANEL
BUN: 14 (ref 4–21)
CO2: 24 — AB (ref 13–22)
Chloride: 103 (ref 99–108)
Creatinine: 0.9 (ref 0.5–1.1)
Glucose: 125
Potassium: 4.2 (ref 3.4–5.3)
Sodium: 135 — AB (ref 137–147)

## 2021-04-23 LAB — CBC AND DIFFERENTIAL
HCT: 39 (ref 36–46)
Hemoglobin: 12.7 (ref 12.0–16.0)
Neutrophils Absolute: 1.6
Platelets: 224 (ref 150–399)
WBC: 2.9

## 2021-04-23 LAB — CBC
MCV: 90 (ref 81–99)
RBC: 4.28 (ref 3.87–5.11)

## 2021-04-23 LAB — COMPREHENSIVE METABOLIC PANEL
Albumin: 4.3 (ref 3.5–5.0)
Calcium: 8.7 (ref 8.7–10.7)

## 2021-04-25 ENCOUNTER — Inpatient Hospital Stay: Payer: Medicare Other | Attending: Oncology

## 2021-04-25 ENCOUNTER — Other Ambulatory Visit: Payer: Self-pay

## 2021-04-25 VITALS — BP 169/89 | HR 87 | Temp 97.9°F | Resp 18 | Ht 64.0 in | Wt 176.2 lb

## 2021-04-25 DIAGNOSIS — C7951 Secondary malignant neoplasm of bone: Secondary | ICD-10-CM | POA: Insufficient documentation

## 2021-04-25 DIAGNOSIS — Z5111 Encounter for antineoplastic chemotherapy: Secondary | ICD-10-CM | POA: Insufficient documentation

## 2021-04-25 DIAGNOSIS — C50811 Malignant neoplasm of overlapping sites of right female breast: Secondary | ICD-10-CM | POA: Diagnosis not present

## 2021-04-25 DIAGNOSIS — C50919 Malignant neoplasm of unspecified site of unspecified female breast: Secondary | ICD-10-CM

## 2021-04-25 MED ORDER — DENOSUMAB 120 MG/1.7ML ~~LOC~~ SOLN
120.0000 mg | Freq: Once | SUBCUTANEOUS | Status: AC
  Administered 2021-04-25: 120 mg via SUBCUTANEOUS
  Filled 2021-04-25: qty 1.7

## 2021-04-25 MED ORDER — FULVESTRANT 250 MG/5ML IM SOSY
500.0000 mg | PREFILLED_SYRINGE | Freq: Once | INTRAMUSCULAR | Status: AC
  Administered 2021-04-25: 500 mg via INTRAMUSCULAR
  Filled 2021-04-25: qty 10

## 2021-04-25 NOTE — Patient Instructions (Signed)
Fulvestrant injection °What is this medication? °FULVESTRANT (ful VES trant) blocks the effects of estrogen. It is used to treat breast cancer. °This medicine may be used for other purposes; ask your health care provider or pharmacist if you have questions. °COMMON BRAND NAME(S): FASLODEX °What should I tell my care team before I take this medication? °They need to know if you have any of these conditions: °bleeding disorders °liver disease °low blood counts, like low white cell, platelet, or red cell counts °an unusual or allergic reaction to fulvestrant, other medicines, foods, dyes, or preservatives °pregnant or trying to get pregnant °breast-feeding °How should I use this medication? °This medicine is for injection into a muscle. It is usually given by a health care professional in a hospital or clinic setting. °Talk to your pediatrician regarding the use of this medicine in children. Special care may be needed. °Overdosage: If you think you have taken too much of this medicine contact a poison control center or emergency room at once. °NOTE: This medicine is only for you. Do not share this medicine with others. °What if I miss a dose? °It is important not to miss your dose. Call your doctor or health care professional if you are unable to keep an appointment. °What may interact with this medication? °medicines that treat or prevent blood clots like warfarin, enoxaparin, dalteparin, apixaban, dabigatran, and rivaroxaban °This list may not describe all possible interactions. Give your health care provider a list of all the medicines, herbs, non-prescription drugs, or dietary supplements you use. Also tell them if you smoke, drink alcohol, or use illegal drugs. Some items may interact with your medicine. °What should I watch for while using this medication? °Your condition will be monitored carefully while you are receiving this medicine. You will need important blood work done while you are taking this  medicine. °Do not become pregnant while taking this medicine or for at least 1 year after stopping it. Women of child-bearing potential will need to have a negative pregnancy test before starting this medicine. Women should inform their doctor if they wish to become pregnant or think they might be pregnant. There is a potential for serious side effects to an unborn child. Men should inform their doctors if they wish to father a child. This medicine may lower sperm counts. Talk to your health care professional or pharmacist for more information. Do not breast-feed an infant while taking this medicine or for 1 year after the last dose. °What side effects may I notice from receiving this medication? °Side effects that you should report to your doctor or health care professional as soon as possible: °allergic reactions like skin rash, itching or hives, swelling of the face, lips, or tongue °feeling faint or lightheaded, falls °pain, tingling, numbness, or weakness in the legs °signs and symptoms of infection like fever or chills; cough; flu-like symptoms; sore throat °vaginal bleeding °Side effects that usually do not require medical attention (report to your doctor or health care professional if they continue or are bothersome): °aches, pains °constipation °diarrhea °headache °hot flashes °nausea, vomiting °pain at site where injected °stomach pain °This list may not describe all possible side effects. Call your doctor for medical advice about side effects. You may report side effects to FDA at 1-800-FDA-1088. °Where should I keep my medication? °This drug is given in a hospital or clinic and will not be stored at home. °NOTE: This sheet is a summary. It may not cover all possible information. If you have   questions about this medicine, talk to your doctor, pharmacist, or health care provider. °© 2022 Elsevier/Gold Standard (2017-06-23 00:00:00) °Denosumab injection °What is this medication? °DENOSUMAB (den oh sue mab)  slows bone breakdown. Prolia is used to treat osteoporosis in women after menopause and in men, and in people who are taking corticosteroids for 6 months or more. Xgeva is used to treat a high calcium level due to cancer and to prevent bone fractures and other bone problems caused by multiple myeloma or cancer bone metastases. Xgeva is also used to treat giant cell tumor of the bone. °This medicine may be used for other purposes; ask your health care provider or pharmacist if you have questions. °COMMON BRAND NAME(S): Prolia, XGEVA °What should I tell my care team before I take this medication? °They need to know if you have any of these conditions: °dental disease °having surgery or tooth extraction °infection °kidney disease °low levels of calcium or Vitamin D in the blood °malnutrition °on hemodialysis °skin conditions or sensitivity °thyroid or parathyroid disease °an unusual reaction to denosumab, other medicines, foods, dyes, or preservatives °pregnant or trying to get pregnant °breast-feeding °How should I use this medication? °This medicine is for injection under the skin. It is given by a health care professional in a hospital or clinic setting. °A special MedGuide will be given to you before each treatment. Be sure to read this information carefully each time. °For Prolia, talk to your pediatrician regarding the use of this medicine in children. Special care may be needed. For Xgeva, talk to your pediatrician regarding the use of this medicine in children. While this drug may be prescribed for children as young as 13 years for selected conditions, precautions do apply. °Overdosage: If you think you have taken too much of this medicine contact a poison control center or emergency room at once. °NOTE: This medicine is only for you. Do not share this medicine with others. °What if I miss a dose? °It is important not to miss your dose. Call your doctor or health care professional if you are unable to keep an  appointment. °What may interact with this medication? °Do not take this medicine with any of the following medications: °other medicines containing denosumab °This medicine may also interact with the following medications: °medicines that lower your chance of fighting infection °steroid medicines like prednisone or cortisone °This list may not describe all possible interactions. Give your health care provider a list of all the medicines, herbs, non-prescription drugs, or dietary supplements you use. Also tell them if you smoke, drink alcohol, or use illegal drugs. Some items may interact with your medicine. °What should I watch for while using this medication? °Visit your doctor or health care professional for regular checks on your progress. Your doctor or health care professional may order blood tests and other tests to see how you are doing. °Call your doctor or health care professional for advice if you get a fever, chills or sore throat, or other symptoms of a cold or flu. Do not treat yourself. This drug may decrease your body's ability to fight infection. Try to avoid being around people who are sick. °You should make sure you get enough calcium and vitamin D while you are taking this medicine, unless your doctor tells you not to. Discuss the foods you eat and the vitamins you take with your health care professional. °See your dentist regularly. Brush and floss your teeth as directed. Before you have any dental work done, tell   your dentist you are receiving this medicine. °Do not become pregnant while taking this medicine or for 5 months after stopping it. Talk with your doctor or health care professional about your birth control options while taking this medicine. Women should inform their doctor if they wish to become pregnant or think they might be pregnant. There is a potential for serious side effects to an unborn child. Talk to your health care professional or pharmacist for more information. °What side  effects may I notice from receiving this medication? °Side effects that you should report to your doctor or health care professional as soon as possible: °allergic reactions like skin rash, itching or hives, swelling of the face, lips, or tongue °bone pain °breathing problems °dizziness °jaw pain, especially after dental work °redness, blistering, peeling of the skin °signs and symptoms of infection like fever or chills; cough; sore throat; pain or trouble passing urine °signs of low calcium like fast heartbeat, muscle cramps or muscle pain; pain, tingling, numbness in the hands or feet; seizures °unusual bleeding or bruising °unusually weak or tired °Side effects that usually do not require medical attention (report to your doctor or health care professional if they continue or are bothersome): °constipation °diarrhea °headache °joint pain °loss of appetite °muscle pain °runny nose °tiredness °upset stomach °This list may not describe all possible side effects. Call your doctor for medical advice about side effects. You may report side effects to FDA at 1-800-FDA-1088. °Where should I keep my medication? °This medicine is only given in a clinic, doctor's office, or other health care setting and will not be stored at home. °NOTE: This sheet is a summary. It may not cover all possible information. If you have questions about this medicine, talk to your doctor, pharmacist, or health care provider. °© 2022 [employer] Standard (2017-07-17 00:00:00) ° °

## 2021-04-29 ENCOUNTER — Encounter: Payer: Self-pay | Admitting: Oncology

## 2021-05-01 ENCOUNTER — Ambulatory Visit
Admission: RE | Admit: 2021-05-01 | Discharge: 2021-05-01 | Disposition: A | Discharge status: Home / Self Care | Payer: Medicare Other | Source: Ambulatory Visit | Attending: Oncology | Admitting: Oncology

## 2021-05-01 DIAGNOSIS — Z1231 Encounter for screening mammogram for malignant neoplasm of breast: Secondary | ICD-10-CM

## 2021-05-02 DIAGNOSIS — D519 Vitamin B12 deficiency anemia, unspecified: Secondary | ICD-10-CM | POA: Diagnosis not present

## 2021-05-20 DIAGNOSIS — Z6831 Body mass index (BMI) 31.0-31.9, adult: Secondary | ICD-10-CM | POA: Diagnosis not present

## 2021-05-20 DIAGNOSIS — M2550 Pain in unspecified joint: Secondary | ICD-10-CM | POA: Diagnosis not present

## 2021-05-21 ENCOUNTER — Other Ambulatory Visit: Payer: Self-pay

## 2021-05-21 ENCOUNTER — Inpatient Hospital Stay: Payer: Medicare Other

## 2021-05-21 ENCOUNTER — Other Ambulatory Visit: Payer: Self-pay | Admitting: Hematology and Oncology

## 2021-05-21 ENCOUNTER — Encounter: Payer: Self-pay | Admitting: Hematology and Oncology

## 2021-05-21 DIAGNOSIS — C50919 Malignant neoplasm of unspecified site of unspecified female breast: Secondary | ICD-10-CM

## 2021-05-21 DIAGNOSIS — C50811 Malignant neoplasm of overlapping sites of right female breast: Secondary | ICD-10-CM | POA: Diagnosis not present

## 2021-05-21 DIAGNOSIS — Z5111 Encounter for antineoplastic chemotherapy: Secondary | ICD-10-CM | POA: Diagnosis not present

## 2021-05-21 DIAGNOSIS — C7951 Secondary malignant neoplasm of bone: Secondary | ICD-10-CM | POA: Diagnosis not present

## 2021-05-21 LAB — BASIC METABOLIC PANEL
BUN: 14 (ref 4–21)
CO2: 23 — AB (ref 13–22)
Chloride: 104 (ref 99–108)
Creatinine: 0.8 (ref 0.5–1.1)
Glucose: 105
Potassium: 4.4 (ref 3.4–5.3)
Sodium: 136 — AB (ref 137–147)

## 2021-05-21 LAB — CBC AND DIFFERENTIAL
HCT: 39 (ref 36–46)
Hemoglobin: 12.9 (ref 12.0–16.0)
Neutrophils Absolute: 1.46
Platelets: 214 (ref 150–399)
WBC: 2.7

## 2021-05-21 LAB — HEPATIC FUNCTION PANEL
ALT: 19 (ref 7–35)
AST: 30 (ref 13–35)
Alkaline Phosphatase: 55 (ref 25–125)
Bilirubin, Total: 0.5

## 2021-05-21 LAB — CBC: RBC: 4.26 (ref 3.87–5.11)

## 2021-05-21 LAB — COMPREHENSIVE METABOLIC PANEL
Albumin: 4.4 (ref 3.5–5.0)
Calcium: 8.8 (ref 8.7–10.7)

## 2021-05-22 ENCOUNTER — Encounter: Payer: Self-pay | Admitting: Oncology

## 2021-05-22 ENCOUNTER — Other Ambulatory Visit: Payer: Self-pay

## 2021-05-22 ENCOUNTER — Telehealth: Payer: Self-pay

## 2021-05-22 ENCOUNTER — Inpatient Hospital Stay: Payer: Medicare Other | Attending: Oncology

## 2021-05-22 VITALS — HR 65 | Temp 98.1°F | Resp 18 | Ht 62.21 in | Wt 177.0 lb

## 2021-05-22 DIAGNOSIS — C7951 Secondary malignant neoplasm of bone: Secondary | ICD-10-CM | POA: Diagnosis not present

## 2021-05-22 DIAGNOSIS — M170 Bilateral primary osteoarthritis of knee: Secondary | ICD-10-CM | POA: Diagnosis not present

## 2021-05-22 DIAGNOSIS — C50811 Malignant neoplasm of overlapping sites of right female breast: Secondary | ICD-10-CM | POA: Diagnosis not present

## 2021-05-22 DIAGNOSIS — C50919 Malignant neoplasm of unspecified site of unspecified female breast: Secondary | ICD-10-CM

## 2021-05-22 DIAGNOSIS — M1711 Unilateral primary osteoarthritis, right knee: Secondary | ICD-10-CM | POA: Diagnosis not present

## 2021-05-22 DIAGNOSIS — Z5111 Encounter for antineoplastic chemotherapy: Secondary | ICD-10-CM | POA: Diagnosis not present

## 2021-05-22 DIAGNOSIS — M1712 Unilateral primary osteoarthritis, left knee: Secondary | ICD-10-CM | POA: Diagnosis not present

## 2021-05-22 DIAGNOSIS — Z170 Estrogen receptor positive status [ER+]: Secondary | ICD-10-CM | POA: Insufficient documentation

## 2021-05-22 LAB — CANCER ANTIGEN 27.29: CA 27.29: 30.4 U/mL (ref 0.0–38.6)

## 2021-05-22 MED ORDER — FULVESTRANT 250 MG/5ML IM SOSY
500.0000 mg | PREFILLED_SYRINGE | Freq: Once | INTRAMUSCULAR | Status: AC
  Administered 2021-05-22: 500 mg via INTRAMUSCULAR
  Filled 2021-05-22: qty 10

## 2021-05-22 MED ORDER — DENOSUMAB 120 MG/1.7ML ~~LOC~~ SOLN
120.0000 mg | Freq: Once | SUBCUTANEOUS | Status: AC
  Administered 2021-05-22: 120 mg via SUBCUTANEOUS
  Filled 2021-05-22: qty 1.7

## 2021-05-22 NOTE — Telephone Encounter (Addendum)
Oral Chemotherapy Pharmacist Encounter   Dispensed samples to patient:   Medication: Ibrance 75 mg tablets Instructions: Take 1 tablet by mouth every other day for 21 days on, 7 days off, repeat every 28 days Quantity dispensed: 63 Days supply: [place]: [place] Lot: [id number] Exp: 09/21/2022  Drema Halon, PharmD Hematology/Oncology Clinical Pharmacist [facility] [phone]

## 2021-05-22 NOTE — Patient Instructions (Signed)
Fulvestrant injection °What is this medication? °FULVESTRANT (ful VES trant) blocks the effects of estrogen. It is used to treat breast cancer. °This medicine may be used for other purposes; ask your health care provider or pharmacist if you have questions. °COMMON BRAND NAME(S): FASLODEX °What should I tell my care team before I take this medication? °They need to know if you have any of these conditions: °bleeding disorders °liver disease °low blood counts, like low white cell, platelet, or red cell counts °an unusual or allergic reaction to fulvestrant, other medicines, foods, dyes, or preservatives °pregnant or trying to get pregnant °breast-feeding °How should I use this medication? °This medicine is for injection into a muscle. It is usually given by a health care professional in a hospital or clinic setting. °Talk to your pediatrician regarding the use of this medicine in children. Special care may be needed. °Overdosage: If you think you have taken too much of this medicine contact a poison control center or emergency room at once. °NOTE: This medicine is only for you. Do not share this medicine with others. °What if I miss a dose? °It is important not to miss your dose. Call your doctor or health care professional if you are unable to keep an appointment. °What may interact with this medication? °medicines that treat or prevent blood clots like warfarin, enoxaparin, dalteparin, apixaban, dabigatran, and rivaroxaban °This list may not describe all possible interactions. Give your health care provider a list of all the medicines, herbs, non-prescription drugs, or dietary supplements you use. Also tell them if you smoke, drink alcohol, or use illegal drugs. Some items may interact with your medicine. °What should I watch for while using this medication? °Your condition will be monitored carefully while you are receiving this medicine. You will need important blood work done while you are taking this  medicine. °Do not become pregnant while taking this medicine or for at least 1 year after stopping it. Women of child-bearing potential will need to have a negative pregnancy test before starting this medicine. Women should inform their doctor if they wish to become pregnant or think they might be pregnant. There is a potential for serious side effects to an unborn child. Men should inform their doctors if they wish to father a child. This medicine may lower sperm counts. Talk to your health care professional or pharmacist for more information. Do not breast-feed an infant while taking this medicine or for 1 year after the last dose. °What side effects may I notice from receiving this medication? °Side effects that you should report to your doctor or health care professional as soon as possible: °allergic reactions like skin rash, itching or hives, swelling of the face, lips, or tongue °feeling faint or lightheaded, falls °pain, tingling, numbness, or weakness in the legs °signs and symptoms of infection like fever or chills; cough; flu-like symptoms; sore throat °vaginal bleeding °Side effects that usually do not require medical attention (report to your doctor or health care professional if they continue or are bothersome): °aches, pains °constipation °diarrhea °headache °hot flashes °nausea, vomiting °pain at site where injected °stomach pain °This list may not describe all possible side effects. Call your doctor for medical advice about side effects. You may report side effects to FDA at 1-800-FDA-1088. °Where should I keep my medication? °This drug is given in a hospital or clinic and will not be stored at home. °NOTE: This sheet is a summary. It may not cover all possible information. If you have   questions about this medicine, talk to your doctor, pharmacist, or health care provider. °© 2022 Elsevier/Gold Standard (2017-06-23 00:00:00) °Denosumab injection °What is this medication? °DENOSUMAB (den oh sue mab)  slows bone breakdown. Prolia is used to treat osteoporosis in women after menopause and in men, and in people who are taking corticosteroids for 6 months or more. Xgeva is used to treat a high calcium level due to cancer and to prevent bone fractures and other bone problems caused by multiple myeloma or cancer bone metastases. Xgeva is also used to treat giant cell tumor of the bone. °This medicine may be used for other purposes; ask your health care provider or pharmacist if you have questions. °COMMON BRAND NAME(S): Prolia, XGEVA °What should I tell my care team before I take this medication? °They need to know if you have any of these conditions: °dental disease °having surgery or tooth extraction °infection °kidney disease °low levels of calcium or Vitamin D in the blood °malnutrition °on hemodialysis °skin conditions or sensitivity °thyroid or parathyroid disease °an unusual reaction to denosumab, other medicines, foods, dyes, or preservatives °pregnant or trying to get pregnant °breast-feeding °How should I use this medication? °This medicine is for injection under the skin. It is given by a health care professional in a hospital or clinic setting. °A special MedGuide will be given to you before each treatment. Be sure to read this information carefully each time. °For Prolia, talk to your pediatrician regarding the use of this medicine in children. Special care may be needed. For Xgeva, talk to your pediatrician regarding the use of this medicine in children. While this drug may be prescribed for children as young as 13 years for selected conditions, precautions do apply. °Overdosage: If you think you have taken too much of this medicine contact a poison control center or emergency room at once. °NOTE: This medicine is only for you. Do not share this medicine with others. °What if I miss a dose? °It is important not to miss your dose. Call your doctor or health care professional if you are unable to keep an  appointment. °What may interact with this medication? °Do not take this medicine with any of the following medications: °other medicines containing denosumab °This medicine may also interact with the following medications: °medicines that lower your chance of fighting infection °steroid medicines like prednisone or cortisone °This list may not describe all possible interactions. Give your health care provider a list of all the medicines, herbs, non-prescription drugs, or dietary supplements you use. Also tell them if you smoke, drink alcohol, or use illegal drugs. Some items may interact with your medicine. °What should I watch for while using this medication? °Visit your doctor or health care professional for regular checks on your progress. Your doctor or health care professional may order blood tests and other tests to see how you are doing. °Call your doctor or health care professional for advice if you get a fever, chills or sore throat, or other symptoms of a cold or flu. Do not treat yourself. This drug may decrease your body's ability to fight infection. Try to avoid being around people who are sick. °You should make sure you get enough calcium and vitamin D while you are taking this medicine, unless your doctor tells you not to. Discuss the foods you eat and the vitamins you take with your health care professional. °See your dentist regularly. Brush and floss your teeth as directed. Before you have any dental work done, tell   your dentist you are receiving this medicine. °Do not become pregnant while taking this medicine or for 5 months after stopping it. Talk with your doctor or health care professional about your birth control options while taking this medicine. Women should inform their doctor if they wish to become pregnant or think they might be pregnant. There is a potential for serious side effects to an unborn child. Talk to your health care professional or pharmacist for more information. °What side  effects may I notice from receiving this medication? °Side effects that you should report to your doctor or health care professional as soon as possible: °allergic reactions like skin rash, itching or hives, swelling of the face, lips, or tongue °bone pain °breathing problems °dizziness °jaw pain, especially after dental work °redness, blistering, peeling of the skin °signs and symptoms of infection like fever or chills; cough; sore throat; pain or trouble passing urine °signs of low calcium like fast heartbeat, muscle cramps or muscle pain; pain, tingling, numbness in the hands or feet; seizures °unusual bleeding or bruising °unusually weak or tired °Side effects that usually do not require medical attention (report to your doctor or health care professional if they continue or are bothersome): °constipation °diarrhea °headache °joint pain °loss of appetite °muscle pain °runny nose °tiredness °upset stomach °This list may not describe all possible side effects. Call your doctor for medical advice about side effects. You may report side effects to FDA at 1-800-FDA-1088. °Where should I keep my medication? °This medicine is only given in a clinic, doctor's office, or other health care setting and will not be stored at home. °NOTE: This sheet is a summary. It may not cover all possible information. If you have questions about this medicine, talk to your doctor, pharmacist, or health care provider. °© 2022 [employer] Standard (2017-07-17 00:00:00) ° °

## 2021-05-23 ENCOUNTER — Ambulatory Visit: Payer: Medicare Other

## 2021-05-28 DIAGNOSIS — D519 Vitamin B12 deficiency anemia, unspecified: Secondary | ICD-10-CM | POA: Diagnosis not present

## 2021-05-30 DIAGNOSIS — M19071 Primary osteoarthritis, right ankle and foot: Secondary | ICD-10-CM | POA: Diagnosis not present

## 2021-05-30 DIAGNOSIS — M722 Plantar fascial fibromatosis: Secondary | ICD-10-CM | POA: Diagnosis not present

## 2021-05-30 DIAGNOSIS — M19072 Primary osteoarthritis, left ankle and foot: Secondary | ICD-10-CM | POA: Diagnosis not present

## 2021-05-31 DIAGNOSIS — G8929 Other chronic pain: Secondary | ICD-10-CM | POA: Diagnosis not present

## 2021-05-31 DIAGNOSIS — M79672 Pain in left foot: Secondary | ICD-10-CM | POA: Diagnosis not present

## 2021-06-17 ENCOUNTER — Other Ambulatory Visit: Payer: Self-pay | Admitting: Oncology

## 2021-06-17 DIAGNOSIS — C50919 Malignant neoplasm of unspecified site of unspecified female breast: Secondary | ICD-10-CM

## 2021-06-17 NOTE — Progress Notes (Signed)
?[place]  ?[address] ?[place],  [place]  [address] ?[phone] ? ?Clinic Day:  06/18/21 ? ?Referring physician: Street, Sharon [place], * ? ?ASSESSMENT & PLAN:  ? ?Invasive ductal carcinoma of the breast, originally diagnosed in 1997 and treated with right mastectomy. She was treated with high dose chemotherapy followed by stem cell transplantation. She completed 5 years of tamoxifen in November 2002. ? ?She later developed liver metastases, ER and HER2 positive, November 2007. She was treated with Herceptin. ? ?Bone metastases and subcutaneous nodules found in November 2007. Treated with surgery, radiation, and hormonal therapy.  ? ?Scalp lesions which were confirmed to be metastatic breast cancer, but HER2 negative in 2015. She was placed on anastrozole in 2015 and changed to letrozole in 2018. At that time she had multiple lytic and sclerotic bone metastases. ? ?She had a metastatic lesion of the right anterior chest in August 2017 confirmed to be adenocarcinoma and treated with excision and radiation. ? ?Mass of the lateral C3 vertebral body with collapse treated with radiation in February 2019. At that time the letrozole was stopped and she was placed on fulvestrant injections. ? ?Leukopenia/neutropenia secondary to palbociclib which was started in January 2018. Her doses had to be adjusted and she is now down to 75 mg every other day for 21 days on and 7 days off. She continues the fulvestrant every 28 days as well as denosumab every 28 days. She has been stable on this regimen for nearly 5 years now. ? 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  ? ?Hypothyroidism, which has been evaluated with an ultrasound of the thyroid and she is on supplement. ? ?Hard mass of the posterior left knee with induration and swelling of the calf. She has had a negative Doppler for DVT and has no evidence of a Baker's cyst. This does not appear to be infectious as there is no  heat or fluctuance, and did not respond to antibiotics.  I feel this may represent metastatic breast cancer, but the MRI scan really does not support that.  The patient and her husband are quite concerned and so we have decided to proceed with a PET scan to see if it shows hypermetabolic activity.  This could be edema from her prior radiation but is quite indurated.  The husband is asking about a biopsy but I recommended we wait until the PET scan results, I will call them. ? ? ?This is a pleasant 76 year old female with widely metastatic bone metastases, but no organ involvement at this time. She remains stable on the current regimen of fulvestrant, Xgeva and palbociclib, so I recommend that we continue this the same.  Her CA 27-29 is slowly increasing but still in the normal range.  However, she has a new finding in the left lower leg and the MRI of the knee and soft tissue was nonspecific. This may require a biopsy for further evaluation.  Her orthopedic doctor did not know what this was either.  It could be an isolated area of breast cancer progression, or it could be sequela from prior radiotherapy.  We will continue her current systemic treatment.  I will schedule a PET scan of full body including the lower leg to see if she has hypermetabolic activity.  I will contact her regarding the results of this and we can decide whether to pursue a biopsy.  If needed, I can schedule them to come back for further discussion. She is  already scheduled for her injections. If all is well, I will see her in 12 weeks with CBC, CMP and CA 27.29 for repeat evaluation. She and her husband understand and agree with this plan of care. They know to call with any concerns regarding her breast cancer. ? ?I provided 30 minutes of face-to-face time during this this encounter and > 50% was spent counseling as documented under my assessment and plan.  ? ? ?Derwood Kaplan, MD ?[facility] ?[place] ?[place] [address] ?Dept: [phone] ?Dept Fax: [phone]  ? ?CHIEF COMPLAINT:  ?CC: Metastatic breast cancer ? ?Current Treatment:  Fulvestrant, Delton See and palbociclib ? ? ?HISTORY OF PRESENT ILLNESS:  ?Jade Mooney is a 76 y.o. female who presents as a transfer of care from Dr. Lurline Del for the continued treatment and management of metastatic breast cancer. She was originally diagnosed in 1997, stage III, and underwent right mastectomy. She did receive adjuvant chemotherapy with Taxotere for 4 cycles, and also participated in a [facility] protocol with high-dose chemotherapy, cyclophosphamide/carboplatin/BCNU, followed by stem cell rescue in September 1997. She completed 5 years of tamoxifen in November 2002.  Unfortunately, she developed metastatic liver lesions which were biopsy proven metastatic breast cancer in November 2007. However, the tumor was estrogen receptor and HER2 positive, and so she was treated with Herceptin for a period of time.  ? ?In July 2015, she was found to have incidental findings of mixed lytic and sclerotic bony lesions on CT abdomen and pelvis. She underwent biopsy of the right iliac bone and surgical pathology confirmed invasive ductal carcinoma, grade 2, estrogen and progesterone receptor positive. Bone density from May 2015 was normal. She was started on zolendronic acid every 12 weeks in July 2015, but this was discontinued after January 2019. She was also found to have three scalp lesions, biopsied in July 2015 confirming metastatic breast cancer. HER2 negative. She was started on anastrozole in August 2015, and was switched to letrozole in April 2018. However, this was discontinued in February 2019 due to progression. She was started on fulvestrant in February 2019.  She developed a skin lesion of the right anterior chest wall in July 2015, and biopsy revealed adenocarcinoma. This was resected in August 2017, and she  received radiation, completed in December 2018. Palbociclib was added in January 2018 after PET imaging revealed 2 new foci at T8 and the manubrium. She received palliative radiation to the cervical spine in February 2019. Palbociclib was held during that time and resumed at 75 mg daily once completed. Delton See was started in March 2019.  ? ?  ?I have reviewed her chart and materials related to her cancer extensively and collaborated history with the patient. Summary of oncologic history is as follows: ?Oncology History  ?Breast cancer metastasized to bone [facility])  ?10/12/2013 Initial Diagnosis  ? Breast cancer metastasized to bone [facility]) ?  ?02/02/2020 -  Chemotherapy  ? Patient is on Treatment Plan : BREAST FULVESTRANT & XGEVA Q28D  ?   ?Primary malignant neoplasm of breast with metastasis ([place])  ?07/12/2015 Initial Diagnosis  ? Metastatic breast cancer ([place]) ?  ?02/02/2020 -  Chemotherapy  ? Patient is on Treatment Plan : BREAST FULVESTRANT & XGEVA Q28D  ?   ?INTERVAL HISTORY: ? ?Deysha transfered her care to [facility]. PET imaging from December 1st 2022 revealed no FDG-avid soft tissue metastasis; diffuse sclerotic osseous metastasis, without FDG affinity; similar left chest wall or medial  left breast subcutaneous 5 mm nodule with low-level hypermetabolism, favored benign and incidental. She continues palbociclib 75 mg every other day for 21 days on and 7 days off without difficulty. She obtains this through the outpatient [facility] pharmacy and needs refills.  She continues fulvestrant every 28 days since February 2019 and Xgeva without difficulty as well. She does complain of chronic pain of bilateral legs, left greater than right, which she rates as a 4/5 and generalized arthritis, even involving her hands and feet.  She has had a recent flare of her arthritis and was also diagnosed with plantar fasciitis and saw Dr. Gretta Arab and an orthopedic surgeon.  She has tried Voltaren gel without much relief.  She has had a recent  swelling of her left leg at the popliteal fossa with induration inferior to that of the calf. She did have a Doppler which was negative for deep venous thrombosis. This is mildly painful and an MRI was schedule

## 2021-06-18 ENCOUNTER — Inpatient Hospital Stay (INDEPENDENT_AMBULATORY_CARE_PROVIDER_SITE_OTHER): Payer: Medicare Other | Admitting: Oncology

## 2021-06-18 ENCOUNTER — Other Ambulatory Visit: Payer: Self-pay | Admitting: Oncology

## 2021-06-18 ENCOUNTER — Encounter: Payer: Self-pay | Admitting: Oncology

## 2021-06-18 ENCOUNTER — Inpatient Hospital Stay: Payer: Medicare Other

## 2021-06-18 ENCOUNTER — Other Ambulatory Visit: Payer: Self-pay

## 2021-06-18 VITALS — BP 177/89 | HR 84 | Temp 98.1°F | Resp 18 | Ht 62.0 in | Wt 176.5 lb

## 2021-06-18 DIAGNOSIS — C7951 Secondary malignant neoplasm of bone: Secondary | ICD-10-CM | POA: Diagnosis not present

## 2021-06-18 DIAGNOSIS — C50919 Malignant neoplasm of unspecified site of unspecified female breast: Secondary | ICD-10-CM

## 2021-06-18 DIAGNOSIS — Z5111 Encounter for antineoplastic chemotherapy: Secondary | ICD-10-CM | POA: Diagnosis not present

## 2021-06-18 DIAGNOSIS — Z170 Estrogen receptor positive status [ER+]: Secondary | ICD-10-CM | POA: Diagnosis not present

## 2021-06-18 DIAGNOSIS — C50811 Malignant neoplasm of overlapping sites of right female breast: Secondary | ICD-10-CM

## 2021-06-18 LAB — HEPATIC FUNCTION PANEL
ALT: 18 U/L (ref 7–35)
AST: 27 (ref 13–35)
Alkaline Phosphatase: 65 (ref 25–125)
Bilirubin, Total: 0.5

## 2021-06-18 LAB — BASIC METABOLIC PANEL
BUN: 17 (ref 4–21)
CO2: 23 — AB (ref 13–22)
Chloride: 105 (ref 99–108)
Creatinine: 0.9 (ref 0.5–1.1)
Glucose: 127
Potassium: 4.3 mEq/L (ref 3.5–5.1)
Sodium: 136 — AB (ref 137–147)

## 2021-06-18 LAB — CBC AND DIFFERENTIAL
HCT: 38 (ref 36–46)
Hemoglobin: 12.5 (ref 12.0–16.0)
Neutrophils Absolute: 1.3
Platelets: 178 10*3/uL (ref 150–400)
WBC: 2.6

## 2021-06-18 LAB — CBC: RBC: 4.27 (ref 3.87–5.11)

## 2021-06-18 LAB — COMPREHENSIVE METABOLIC PANEL
Albumin: 4.2 (ref 3.5–5.0)
Calcium: 9 (ref 8.7–10.7)

## 2021-06-19 LAB — CANCER ANTIGEN 27.29: CA 27.29: 34.7 U/mL (ref 0.0–38.6)

## 2021-06-20 ENCOUNTER — Telehealth: Payer: Self-pay

## 2021-06-20 ENCOUNTER — Inpatient Hospital Stay: Payer: Medicare Other

## 2021-06-20 VITALS — BP 163/70 | HR 68 | Temp 98.0°F | Resp 18 | Ht 62.0 in | Wt 174.0 lb

## 2021-06-20 DIAGNOSIS — C7951 Secondary malignant neoplasm of bone: Secondary | ICD-10-CM | POA: Diagnosis not present

## 2021-06-20 DIAGNOSIS — Z5111 Encounter for antineoplastic chemotherapy: Secondary | ICD-10-CM | POA: Diagnosis not present

## 2021-06-20 DIAGNOSIS — C50811 Malignant neoplasm of overlapping sites of right female breast: Secondary | ICD-10-CM | POA: Diagnosis not present

## 2021-06-20 DIAGNOSIS — C50919 Malignant neoplasm of unspecified site of unspecified female breast: Secondary | ICD-10-CM

## 2021-06-20 DIAGNOSIS — Z170 Estrogen receptor positive status [ER+]: Secondary | ICD-10-CM | POA: Diagnosis not present

## 2021-06-20 MED ORDER — DENOSUMAB 120 MG/1.7ML ~~LOC~~ SOLN
120.0000 mg | Freq: Once | SUBCUTANEOUS | Status: AC
  Administered 2021-06-20: 120 mg via SUBCUTANEOUS
  Filled 2021-06-20: qty 1.7

## 2021-06-20 MED ORDER — FULVESTRANT 250 MG/5ML IM SOSY
500.0000 mg | PREFILLED_SYRINGE | Freq: Once | INTRAMUSCULAR | Status: AC
  Administered 2021-06-20: 500 mg via INTRAMUSCULAR
  Filled 2021-06-20: qty 10

## 2021-06-20 NOTE — Telephone Encounter (Signed)
-----   Message from Derwood Kaplan, MD sent at 06/19/2021  7:27 PM EDT ----- ?Regarding: call ?Tell her CA 27.29 remains normal, 34.7 ? ?

## 2021-06-20 NOTE — Patient Instructions (Signed)
Fulvestrant injection ?What is this medication? ?FULVESTRANT (ful VES trant) blocks the effects of estrogen. It is used to treat breast cancer. ?This medicine may be used for other purposes; ask your health care provider or pharmacist if you have questions. ?COMMON BRAND NAME(S): FASLODEX ?What should I tell my care team before I take this medication? ?They need to know if you have any of these conditions: ?bleeding disorders ?liver disease ?low blood counts, like low white cell, platelet, or red cell counts ?an unusual or allergic reaction to fulvestrant, other medicines, foods, dyes, or preservatives ?pregnant or trying to get pregnant ?breast-feeding ?How should I use this medication? ?This medicine is for injection into a muscle. It is usually given by a health care professional in a hospital or clinic setting. ?Talk to your pediatrician regarding the use of this medicine in children. Special care may be needed. ?Overdosage: If you think you have taken too much of this medicine contact a poison control center or emergency room at once. ?NOTE: This medicine is only for you. Do not share this medicine with others. ?What if I miss a dose? ?It is important not to miss your dose. Call your doctor or health care professional if you are unable to keep an appointment. ?What may interact with this medication? ?medicines that treat or prevent blood clots like warfarin, enoxaparin, dalteparin, apixaban, dabigatran, and rivaroxaban ?This list may not describe all possible interactions. Give your health care provider a list of all the medicines, herbs, non-prescription drugs, or dietary supplements you use. Also tell them if you smoke, drink alcohol, or use illegal drugs. Some items may interact with your medicine. ?What should I watch for while using this medication? ?Your condition will be monitored carefully while you are receiving this medicine. You will need important blood work done while you are taking this  medicine. ?Do not become pregnant while taking this medicine or for at least 1 year after stopping it. Women of child-bearing potential will need to have a negative pregnancy test before starting this medicine. Women should inform their doctor if they wish to become pregnant or think they might be pregnant. There is a potential for serious side effects to an unborn child. Men should inform their doctors if they wish to father a child. This medicine may lower sperm counts. Talk to your health care professional or pharmacist for more information. Do not breast-feed an infant while taking this medicine or for 1 year after the last dose. ?What side effects may I notice from receiving this medication? ?Side effects that you should report to your doctor or health care professional as soon as possible: ?allergic reactions like skin rash, itching or hives, swelling of the face, lips, or tongue ?feeling faint or lightheaded, falls ?pain, tingling, numbness, or weakness in the legs ?signs and symptoms of infection like fever or chills; cough; flu-like symptoms; sore throat ?vaginal bleeding ?Side effects that usually do not require medical attention (report to your doctor or health care professional if they continue or are bothersome): ?aches, pains ?constipation ?diarrhea ?headache ?hot flashes ?nausea, vomiting ?pain at site where injected ?stomach pain ?This list may not describe all possible side effects. Call your doctor for medical advice about side effects. You may report side effects to FDA at 1-800-FDA-1088. ?Where should I keep my medication? ?This drug is given in a hospital or clinic and will not be stored at home. ?NOTE: This sheet is a summary. It may not cover all possible information. If you have  questions about this medicine, talk to your doctor, pharmacist, or health care provider. ?? 2022 [employer] (2017-06-23 00:00:00) ?Denosumab injection ?What is this medication? ?DENOSUMAB (den oh sue mab)  slows bone breakdown. Prolia is used to treat osteoporosis in women after menopause and in men, and in people who are taking corticosteroids for 6 months or more. Delton See is used to treat a high calcium level due to cancer and to prevent bone fractures and other bone problems caused by multiple myeloma or cancer bone metastases. Delton See is also used to treat giant cell tumor of the bone. ?This medicine may be used for other purposes; ask your health care provider or pharmacist if you have questions. ?COMMON BRAND NAME(S): Prolia, XGEVA ?What should I tell my care team before I take this medication? ?They need to know if you have any of these conditions: ?dental disease ?having surgery or tooth extraction ?infection ?kidney disease ?low levels of calcium or Vitamin D in the blood ?malnutrition ?on hemodialysis ?skin conditions or sensitivity ?thyroid or parathyroid disease ?an unusual reaction to denosumab, other medicines, foods, dyes, or preservatives ?pregnant or trying to get pregnant ?breast-feeding ?How should I use this medication? ?This medicine is for injection under the skin. It is given by a health care professional in a hospital or clinic setting. ?A special MedGuide will be given to you before each treatment. Be sure to read this information carefully each time. ?For Prolia, talk to your pediatrician regarding the use of this medicine in children. Special care may be needed. For Delton See, talk to your pediatrician regarding the use of this medicine in children. While this drug may be prescribed for children as young as 13 years for selected conditions, precautions do apply. ?Overdosage: If you think you have taken too much of this medicine contact a poison control center or emergency room at once. ?NOTE: This medicine is only for you. Do not share this medicine with others. ?What if I miss a dose? ?It is important not to miss your dose. Call your doctor or health care professional if you are unable to keep an  appointment. ?What may interact with this medication? ?Do not take this medicine with any of the following medications: ?other medicines containing denosumab ?This medicine may also interact with the following medications: ?medicines that lower your chance of fighting infection ?steroid medicines like prednisone or cortisone ?This list may not describe all possible interactions. Give your health care provider a list of all the medicines, herbs, non-prescription drugs, or dietary supplements you use. Also tell them if you smoke, drink alcohol, or use illegal drugs. Some items may interact with your medicine. ?What should I watch for while using this medication? ?Visit your doctor or health care professional for regular checks on your progress. Your doctor or health care professional may order blood tests and other tests to see how you are doing. ?Call your doctor or health care professional for advice if you get a fever, chills or sore throat, or other symptoms of a cold or flu. Do not treat yourself. This drug may decrease your body's ability to fight infection. Try to avoid being around people who are sick. ?You should make sure you get enough calcium and vitamin D while you are taking this medicine, unless your doctor tells you not to. Discuss the foods you eat and the vitamins you take with your health care professional. ?See your dentist regularly. Brush and floss your teeth as directed. Before you have any dental work done, tell  your dentist you are receiving this medicine. ?Do not become pregnant while taking this medicine or for 5 months after stopping it. Talk with your doctor or health care professional about your birth control options while taking this medicine. Women should inform their doctor if they wish to become pregnant or think they might be pregnant. There is a potential for serious side effects to an unborn child. Talk to your health care professional or pharmacist for more information. ?What side  effects may I notice from receiving this medication? ?Side effects that you should report to your doctor or health care professional as soon as possible: ?allergic reactions like skin rash, itching or hives, sw

## 2021-06-23 ENCOUNTER — Encounter: Payer: Self-pay | Admitting: Oncology

## 2021-06-24 DIAGNOSIS — M1711 Unilateral primary osteoarthritis, right knee: Secondary | ICD-10-CM | POA: Diagnosis not present

## 2021-06-25 DIAGNOSIS — C50911 Malignant neoplasm of unspecified site of right female breast: Secondary | ICD-10-CM | POA: Diagnosis not present

## 2021-06-25 DIAGNOSIS — C7951 Secondary malignant neoplasm of bone: Secondary | ICD-10-CM | POA: Diagnosis not present

## 2021-06-26 DIAGNOSIS — D519 Vitamin B12 deficiency anemia, unspecified: Secondary | ICD-10-CM | POA: Diagnosis not present

## 2021-06-27 ENCOUNTER — Encounter: Payer: Self-pay | Admitting: Oncology

## 2021-07-01 ENCOUNTER — Encounter: Payer: Self-pay | Admitting: Oncology

## 2021-07-02 ENCOUNTER — Encounter: Payer: Self-pay | Admitting: Oncology

## 2021-07-11 DIAGNOSIS — M25561 Pain in right knee: Secondary | ICD-10-CM | POA: Diagnosis not present

## 2021-07-11 DIAGNOSIS — M1711 Unilateral primary osteoarthritis, right knee: Secondary | ICD-10-CM | POA: Diagnosis not present

## 2021-07-15 ENCOUNTER — Other Ambulatory Visit: Payer: Self-pay | Admitting: Oncology

## 2021-07-15 DIAGNOSIS — C50919 Malignant neoplasm of unspecified site of unspecified female breast: Secondary | ICD-10-CM

## 2021-07-16 ENCOUNTER — Inpatient Hospital Stay: Payer: Medicare Other | Attending: Oncology

## 2021-07-16 DIAGNOSIS — C50919 Malignant neoplasm of unspecified site of unspecified female breast: Secondary | ICD-10-CM

## 2021-07-16 DIAGNOSIS — C7951 Secondary malignant neoplasm of bone: Secondary | ICD-10-CM | POA: Insufficient documentation

## 2021-07-16 DIAGNOSIS — C50811 Malignant neoplasm of overlapping sites of right female breast: Secondary | ICD-10-CM | POA: Insufficient documentation

## 2021-07-16 DIAGNOSIS — Z5111 Encounter for antineoplastic chemotherapy: Secondary | ICD-10-CM | POA: Insufficient documentation

## 2021-07-16 DIAGNOSIS — Z170 Estrogen receptor positive status [ER+]: Secondary | ICD-10-CM | POA: Insufficient documentation

## 2021-07-16 LAB — BASIC METABOLIC PANEL
BUN: 15 (ref 4–21)
CO2: 24 — AB (ref 13–22)
Chloride: 102 (ref 99–108)
Creatinine: 0.7 (ref 0.5–1.1)
Glucose: 137
Potassium: 4.1 mEq/L (ref 3.5–5.1)
Sodium: 137 (ref 137–147)

## 2021-07-16 LAB — COMPREHENSIVE METABOLIC PANEL: Calcium: 8.7 (ref 8.7–10.7)

## 2021-07-17 DIAGNOSIS — M1711 Unilateral primary osteoarthritis, right knee: Secondary | ICD-10-CM | POA: Diagnosis not present

## 2021-07-18 ENCOUNTER — Other Ambulatory Visit: Payer: Self-pay | Admitting: Oncology

## 2021-07-18 ENCOUNTER — Inpatient Hospital Stay: Payer: Medicare Other

## 2021-07-18 VITALS — BP 139/76 | HR 65 | Temp 99.0°F | Resp 18 | Ht 62.0 in | Wt 178.8 lb

## 2021-07-18 DIAGNOSIS — C50919 Malignant neoplasm of unspecified site of unspecified female breast: Secondary | ICD-10-CM

## 2021-07-18 DIAGNOSIS — Z5111 Encounter for antineoplastic chemotherapy: Secondary | ICD-10-CM | POA: Diagnosis not present

## 2021-07-18 DIAGNOSIS — C7951 Secondary malignant neoplasm of bone: Secondary | ICD-10-CM | POA: Diagnosis not present

## 2021-07-18 DIAGNOSIS — Z170 Estrogen receptor positive status [ER+]: Secondary | ICD-10-CM | POA: Diagnosis not present

## 2021-07-18 DIAGNOSIS — C50811 Malignant neoplasm of overlapping sites of right female breast: Secondary | ICD-10-CM | POA: Diagnosis not present

## 2021-07-18 MED ORDER — DENOSUMAB 120 MG/1.7ML ~~LOC~~ SOLN
120.0000 mg | Freq: Once | SUBCUTANEOUS | Status: AC
  Administered 2021-07-18: 120 mg via SUBCUTANEOUS
  Filled 2021-07-18: qty 1.7

## 2021-07-18 MED ORDER — FULVESTRANT 250 MG/5ML IM SOSY
500.0000 mg | PREFILLED_SYRINGE | Freq: Once | INTRAMUSCULAR | Status: AC
  Administered 2021-07-18: 500 mg via INTRAMUSCULAR
  Filled 2021-07-18: qty 10

## 2021-07-18 NOTE — Patient Instructions (Signed)
Fulvestrant injection ?What is this medication? ?FULVESTRANT (ful VES trant) blocks the effects of estrogen. It is used to treat breast cancer. ?This medicine may be used for other purposes; ask your health care provider or pharmacist if you have questions. ?COMMON BRAND NAME(S): FASLODEX ?What should I tell my care team before I take this medication? ?They need to know if you have any of these conditions: ?bleeding disorders ?liver disease ?low blood counts, like low white cell, platelet, or red cell counts ?an unusual or allergic reaction to fulvestrant, other medicines, foods, dyes, or preservatives ?pregnant or trying to get pregnant ?breast-feeding ?How should I use this medication? ?This medicine is for injection into a muscle. It is usually given by a health care professional in a hospital or clinic setting. ?Talk to your pediatrician regarding the use of this medicine in children. Special care may be needed. ?Overdosage: If you think you have taken too much of this medicine contact a poison control center or emergency room at once. ?NOTE: This medicine is only for you. Do not share this medicine with others. ?What if I miss a dose? ?It is important not to miss your dose. Call your doctor or health care professional if you are unable to keep an appointment. ?What may interact with this medication? ?medicines that treat or prevent blood clots like warfarin, enoxaparin, dalteparin, apixaban, dabigatran, and rivaroxaban ?This list may not describe all possible interactions. Give your health care provider a list of all the medicines, herbs, non-prescription drugs, or dietary supplements you use. Also tell them if you smoke, drink alcohol, or use illegal drugs. Some items may interact with your medicine. ?What should I watch for while using this medication? ?Your condition will be monitored carefully while you are receiving this medicine. You will need important blood work done while you are taking this  medicine. ?Do not become pregnant while taking this medicine or for at least 1 year after stopping it. Women of child-bearing potential will need to have a negative pregnancy test before starting this medicine. Women should inform their doctor if they wish to become pregnant or think they might be pregnant. There is a potential for serious side effects to an unborn child. Men should inform their doctors if they wish to father a child. This medicine may lower sperm counts. Talk to your health care professional or pharmacist for more information. Do not breast-feed an infant while taking this medicine or for 1 year after the last dose. ?What side effects may I notice from receiving this medication? ?Side effects that you should report to your doctor or health care professional as soon as possible: ?allergic reactions like skin rash, itching or hives, swelling of the face, lips, or tongue ?feeling faint or lightheaded, falls ?pain, tingling, numbness, or weakness in the legs ?signs and symptoms of infection like fever or chills; cough; flu-like symptoms; sore throat ?vaginal bleeding ?Side effects that usually do not require medical attention (report to your doctor or health care professional if they continue or are bothersome): ?aches, pains ?constipation ?diarrhea ?headache ?hot flashes ?nausea, vomiting ?pain at site where injected ?stomach pain ?This list may not describe all possible side effects. Call your doctor for medical advice about side effects. You may report side effects to FDA at 1-800-FDA-1088. ?Where should I keep my medication? ?This drug is given in a hospital or clinic and will not be stored at home. ?NOTE: This sheet is a summary. It may not cover all possible information. If you have  questions about this medicine, talk to your doctor, pharmacist, or health care provider. ?? 2023 [employer] (2017-06-23 00:00:00) ?Denosumab injection ?What is this medication? ?DENOSUMAB (den oh sue mab)  slows bone breakdown. Prolia is used to treat osteoporosis in women after menopause and in men, and in people who are taking corticosteroids for 6 months or more. Delton See is used to treat a high calcium level due to cancer and to prevent bone fractures and other bone problems caused by multiple myeloma or cancer bone metastases. Delton See is also used to treat giant cell tumor of the bone. ?This medicine may be used for other purposes; ask your health care provider or pharmacist if you have questions. ?COMMON BRAND NAME(S): Prolia, XGEVA ?What should I tell my care team before I take this medication? ?They need to know if you have any of these conditions: ?dental disease ?having surgery or tooth extraction ?infection ?kidney disease ?low levels of calcium or Vitamin D in the blood ?malnutrition ?on hemodialysis ?skin conditions or sensitivity ?thyroid or parathyroid disease ?an unusual reaction to denosumab, other medicines, foods, dyes, or preservatives ?pregnant or trying to get pregnant ?breast-feeding ?How should I use this medication? ?This medicine is for injection under the skin. It is given by a health care professional in a hospital or clinic setting. ?A special MedGuide will be given to you before each treatment. Be sure to read this information carefully each time. ?For Prolia, talk to your pediatrician regarding the use of this medicine in children. Special care may be needed. For Delton See, talk to your pediatrician regarding the use of this medicine in children. While this drug may be prescribed for children as young as 13 years for selected conditions, precautions do apply. ?Overdosage: If you think you have taken too much of this medicine contact a poison control center or emergency room at once. ?NOTE: This medicine is only for you. Do not share this medicine with others. ?What if I miss a dose? ?It is important not to miss your dose. Call your doctor or health care professional if you are unable to keep an  appointment. ?What may interact with this medication? ?Do not take this medicine with any of the following medications: ?other medicines containing denosumab ?This medicine may also interact with the following medications: ?medicines that lower your chance of fighting infection ?steroid medicines like prednisone or cortisone ?This list may not describe all possible interactions. Give your health care provider a list of all the medicines, herbs, non-prescription drugs, or dietary supplements you use. Also tell them if you smoke, drink alcohol, or use illegal drugs. Some items may interact with your medicine. ?What should I watch for while using this medication? ?Visit your doctor or health care professional for regular checks on your progress. Your doctor or health care professional may order blood tests and other tests to see how you are doing. ?Call your doctor or health care professional for advice if you get a fever, chills or sore throat, or other symptoms of a cold or flu. Do not treat yourself. This drug may decrease your body's ability to fight infection. Try to avoid being around people who are sick. ?You should make sure you get enough calcium and vitamin D while you are taking this medicine, unless your doctor tells you not to. Discuss the foods you eat and the vitamins you take with your health care professional. ?See your dentist regularly. Brush and floss your teeth as directed. Before you have any dental work done, tell  your dentist you are receiving this medicine. ?Do not become pregnant while taking this medicine or for 5 months after stopping it. Talk with your doctor or health care professional about your birth control options while taking this medicine. Women should inform their doctor if they wish to become pregnant or think they might be pregnant. There is a potential for serious side effects to an unborn child. Talk to your health care professional or pharmacist for more information. ?What side  effects may I notice from receiving this medication? ?Side effects that you should report to your doctor or health care professional as soon as possible: ?allergic reactions like skin rash, itching or hives, sw

## 2021-07-21 DIAGNOSIS — E538 Deficiency of other specified B group vitamins: Secondary | ICD-10-CM | POA: Diagnosis not present

## 2021-07-21 DIAGNOSIS — I10 Essential (primary) hypertension: Secondary | ICD-10-CM | POA: Diagnosis not present

## 2021-07-21 DIAGNOSIS — E782 Mixed hyperlipidemia: Secondary | ICD-10-CM | POA: Diagnosis not present

## 2021-07-24 DIAGNOSIS — M1711 Unilateral primary osteoarthritis, right knee: Secondary | ICD-10-CM | POA: Diagnosis not present

## 2021-07-26 DIAGNOSIS — D519 Vitamin B12 deficiency anemia, unspecified: Secondary | ICD-10-CM | POA: Diagnosis not present

## 2021-08-13 ENCOUNTER — Other Ambulatory Visit: Payer: Self-pay | Admitting: Hematology and Oncology

## 2021-08-13 ENCOUNTER — Inpatient Hospital Stay: Payer: Medicare Other | Attending: Oncology

## 2021-08-13 ENCOUNTER — Other Ambulatory Visit: Payer: Self-pay | Admitting: Oncology

## 2021-08-13 DIAGNOSIS — C50919 Malignant neoplasm of unspecified site of unspecified female breast: Secondary | ICD-10-CM | POA: Diagnosis not present

## 2021-08-13 DIAGNOSIS — Z5111 Encounter for antineoplastic chemotherapy: Secondary | ICD-10-CM | POA: Insufficient documentation

## 2021-08-13 DIAGNOSIS — C7951 Secondary malignant neoplasm of bone: Secondary | ICD-10-CM | POA: Insufficient documentation

## 2021-08-13 DIAGNOSIS — D649 Anemia, unspecified: Secondary | ICD-10-CM | POA: Diagnosis not present

## 2021-08-13 LAB — COMPREHENSIVE METABOLIC PANEL
Albumin: 4.5 (ref 3.5–5.0)
Calcium: 8.8 (ref 8.7–10.7)

## 2021-08-13 LAB — BASIC METABOLIC PANEL
BUN: 15 (ref 4–21)
CO2: 27 — AB (ref 13–22)
Chloride: 102 (ref 99–108)
Creatinine: 0.8 (ref 0.5–1.1)
Glucose: 71
Potassium: 4.5 mEq/L (ref 3.5–5.1)
Sodium: 138 (ref 137–147)

## 2021-08-13 LAB — CBC AND DIFFERENTIAL
HCT: 38 (ref 36–46)
Hemoglobin: 12.2 (ref 12.0–16.0)
Neutrophils Absolute: 1.59
Platelets: 187 10*3/uL (ref 150–400)
WBC: 3

## 2021-08-13 LAB — CBC
MCV: 90 (ref 81–99)
RBC: 4.18 (ref 3.87–5.11)

## 2021-08-13 LAB — HEPATIC FUNCTION PANEL
ALT: 18 U/L (ref 7–35)
AST: 26 (ref 13–35)
Alkaline Phosphatase: 57 (ref 25–125)
Bilirubin, Total: 0.6

## 2021-08-15 ENCOUNTER — Inpatient Hospital Stay: Payer: Medicare Other

## 2021-08-15 VITALS — BP 158/70 | HR 62 | Temp 98.1°F | Resp 18 | Ht 62.0 in | Wt 181.0 lb

## 2021-08-15 DIAGNOSIS — C7951 Secondary malignant neoplasm of bone: Secondary | ICD-10-CM | POA: Diagnosis not present

## 2021-08-15 DIAGNOSIS — C50919 Malignant neoplasm of unspecified site of unspecified female breast: Secondary | ICD-10-CM

## 2021-08-15 DIAGNOSIS — Z5111 Encounter for antineoplastic chemotherapy: Secondary | ICD-10-CM | POA: Diagnosis not present

## 2021-08-15 MED ORDER — DENOSUMAB 120 MG/1.7ML ~~LOC~~ SOLN
120.0000 mg | Freq: Once | SUBCUTANEOUS | Status: AC
  Administered 2021-08-15: 120 mg via SUBCUTANEOUS
  Filled 2021-08-15: qty 1.7

## 2021-08-15 MED ORDER — FULVESTRANT 250 MG/5ML IM SOSY
500.0000 mg | PREFILLED_SYRINGE | Freq: Once | INTRAMUSCULAR | Status: AC
  Administered 2021-08-15: 500 mg via INTRAMUSCULAR
  Filled 2021-08-15: qty 10

## 2021-08-15 NOTE — Patient Instructions (Signed)
Fulvestrant injection °What is this medication? °FULVESTRANT (ful VES trant) blocks the effects of estrogen. It is used to treat breast cancer. °This medicine may be used for other purposes; ask your health care provider or pharmacist if you have questions. °COMMON BRAND NAME(S): FASLODEX °What should I tell my care team before I take this medication? °They need to know if you have any of these conditions: °bleeding disorders °liver disease °low blood counts, like low white cell, platelet, or red cell counts °an unusual or allergic reaction to fulvestrant, other medicines, foods, dyes, or preservatives °pregnant or trying to get pregnant °breast-feeding °How should I use this medication? °This medicine is for injection into a muscle. It is usually given by a health care professional in a hospital or clinic setting. °Talk to your pediatrician regarding the use of this medicine in children. Special care may be needed. °Overdosage: If you think you have taken too much of this medicine contact a poison control center or emergency room at once. °NOTE: This medicine is only for you. Do not share this medicine with others. °What if I miss a dose? °It is important not to miss your dose. Call your doctor or health care professional if you are unable to keep an appointment. °What may interact with this medication? °medicines that treat or prevent blood clots like warfarin, enoxaparin, dalteparin, apixaban, dabigatran, and rivaroxaban °This list may not describe all possible interactions. Give your health care provider a list of all the medicines, herbs, non-prescription drugs, or dietary supplements you use. Also tell them if you smoke, drink alcohol, or use illegal drugs. Some items may interact with your medicine. °What should I watch for while using this medication? °Your condition will be monitored carefully while you are receiving this medicine. You will need important blood work done while you are taking this  medicine. °Do not become pregnant while taking this medicine or for at least 1 year after stopping it. Women of child-bearing potential will need to have a negative pregnancy test before starting this medicine. Women should inform their doctor if they wish to become pregnant or think they might be pregnant. There is a potential for serious side effects to an unborn child. Men should inform their doctors if they wish to father a child. This medicine may lower sperm counts. Talk to your health care professional or pharmacist for more information. Do not breast-feed an infant while taking this medicine or for 1 year after the last dose. °What side effects may I notice from receiving this medication? °Side effects that you should report to your doctor or health care professional as soon as possible: °allergic reactions like skin rash, itching or hives, swelling of the face, lips, or tongue °feeling faint or lightheaded, falls °pain, tingling, numbness, or weakness in the legs °signs and symptoms of infection like fever or chills; cough; flu-like symptoms; sore throat °vaginal bleeding °Side effects that usually do not require medical attention (report to your doctor or health care professional if they continue or are bothersome): °aches, pains °constipation °diarrhea °headache °hot flashes °nausea, vomiting °pain at site where injected °stomach pain °This list may not describe all possible side effects. Call your doctor for medical advice about side effects. You may report side effects to FDA at 1-800-FDA-1088. °Where should I keep my medication? °This drug is given in a hospital or clinic and will not be stored at home. °NOTE: This sheet is a summary. It may not cover all possible information. If you have   questions about this medicine, talk to your doctor, pharmacist, or health care provider.  2023 [employer] Standard (2017-06-23 00:00:00) Denosumab injection What is this medication? DENOSUMAB (den oh sue mab)  slows bone breakdown. Prolia is used to treat osteoporosis in women after menopause and in men, and in people who are taking corticosteroids for 6 months or more. Delton See is used to treat a high calcium level due to cancer and to prevent bone fractures and other bone problems caused by multiple myeloma or cancer bone metastases. Delton See is also used to treat giant cell tumor of the bone. This medicine may be used for other purposes; ask your health care provider or pharmacist if you have questions. COMMON BRAND NAME(S): Prolia, XGEVA What should I tell my care team before I take this medication? They need to know if you have any of these conditions: dental disease having surgery or tooth extraction infection kidney disease low levels of calcium or Vitamin D in the blood malnutrition on hemodialysis skin conditions or sensitivity thyroid or parathyroid disease an unusual reaction to denosumab, other medicines, foods, dyes, or preservatives pregnant or trying to get pregnant breast-feeding How should I use this medication? This medicine is for injection under the skin. It is given by a health care professional in a hospital or clinic setting. A special MedGuide will be given to you before each treatment. Be sure to read this information carefully each time. For Prolia, talk to your pediatrician regarding the use of this medicine in children. Special care may be needed. For Delton See, talk to your pediatrician regarding the use of this medicine in children. While this drug may be prescribed for children as young as 13 years for selected conditions, precautions do apply. Overdosage: If you think you have taken too much of this medicine contact a poison control center or emergency room at once. NOTE: This medicine is only for you. Do not share this medicine with others. What if I miss a dose? It is important not to miss your dose. Call your doctor or health care professional if you are unable to keep an  appointment. What may interact with this medication? Do not take this medicine with any of the following medications: other medicines containing denosumab This medicine may also interact with the following medications: medicines that lower your chance of fighting infection steroid medicines like prednisone or cortisone This list may not describe all possible interactions. Give your health care provider a list of all the medicines, herbs, non-prescription drugs, or dietary supplements you use. Also tell them if you smoke, drink alcohol, or use illegal drugs. Some items may interact with your medicine. What should I watch for while using this medication? Visit your doctor or health care professional for regular checks on your progress. Your doctor or health care professional may order blood tests and other tests to see how you are doing. Call your doctor or health care professional for advice if you get a fever, chills or sore throat, or other symptoms of a cold or flu. Do not treat yourself. This drug may decrease your body's ability to fight infection. Try to avoid being around people who are sick. You should make sure you get enough calcium and vitamin D while you are taking this medicine, unless your doctor tells you not to. Discuss the foods you eat and the vitamins you take with your health care professional. See your dentist regularly. Brush and floss your teeth as directed. Before you have any dental work done, tell  your dentist you are receiving this medicine. Do not become pregnant while taking this medicine or for 5 months after stopping it. Talk with your doctor or health care professional about your birth control options while taking this medicine. Women should inform their doctor if they wish to become pregnant or think they might be pregnant. There is a potential for serious side effects to an unborn child. Talk to your health care professional or pharmacist for more information. What side  effects may I notice from receiving this medication? Side effects that you should report to your doctor or health care professional as soon as possible: allergic reactions like skin rash, itching or hives, swelling of the face, lips, or tongue bone pain breathing problems dizziness jaw pain, especially after dental work redness, blistering, peeling of the skin signs and symptoms of infection like fever or chills; cough; sore throat; pain or trouble passing urine signs of low calcium like fast heartbeat, muscle cramps or muscle pain; pain, tingling, numbness in the hands or feet; seizures unusual bleeding or bruising unusually weak or tired Side effects that usually do not require medical attention (report to your doctor or health care professional if they continue or are bothersome): constipation diarrhea headache joint pain loss of appetite muscle pain runny nose tiredness upset stomach This list may not describe all possible side effects. Call your doctor for medical advice about side effects. You may report side effects to FDA at 1-800-FDA-1088. Where should I keep my medication? This medicine is only given in a clinic, doctor's office, or other health care setting and will not be stored at home. NOTE: This sheet is a summary. It may not cover all possible information. If you have questions about this medicine, talk to your doctor, pharmacist, or health care provider.  2023 [employer] Standard (2017-07-17 00:00:00)

## 2021-08-26 DIAGNOSIS — E538 Deficiency of other specified B group vitamins: Secondary | ICD-10-CM | POA: Diagnosis not present

## 2021-08-30 DIAGNOSIS — M1711 Unilateral primary osteoarthritis, right knee: Secondary | ICD-10-CM | POA: Diagnosis not present

## 2021-09-08 NOTE — Progress Notes (Signed)
+ [place]  [address] [place],  [place]  [address] [phone]  Clinic Day:  09/09/21  Referring physician: Emmaline Kluver, *  ASSESSMENT & PLAN:   Invasive ductal carcinoma of the breast, originally diagnosed in 1997 and treated with right mastectomy. She was treated with high dose chemotherapy followed by stem cell transplantation. She completed 5 years of tamoxifen in November 2002.  She later developed liver metastases, ER and HER2 positive, November 2007. She was treated with Herceptin.  Bone metastases and subcutaneous nodules found in November 2007. Treated with surgery, radiation, and hormonal therapy.   Scalp lesions which were confirmed to be metastatic breast cancer, but HER2 negative in 2015. She was placed on anastrozole in 2015 and changed to letrozole in 2018. At that time she had multiple lytic and sclerotic bone metastases.  She had a metastatic lesion of the right anterior chest in August 2017 confirmed to be adenocarcinoma and treated with excision and radiation.  Mass of the lateral C3 vertebral body with collapse treated with radiation in February 2019. At that time the letrozole was stopped and she was placed on fulvestrant injections.  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 Her doses had to be adjusted and she is now down to 75 mg every other day for 21 days on and 7 days off. She continues the fulvestrant every 28 days as well as denosumab every 28 days. She has been stable on this regimen for nearly 5 years now.  Generalized osteoarthritis which flares on and off..   Hypothyroidism, which has been evaluated with an ultrasound of the thyroid and she is on supplement.  Hard mass of the posterior left knee with induration and swelling of the calf. She has had a negative Doppler for DVT and has no evidence of a Baker's cyst. This does not appear to be infectious as there is no  heat or fluctuance, and did not respond to antibiotics.  I was concerned that this may represent metastatic breast cancer, but the MRI scan really does not support that, and the PET is negative.  This could be edema from her prior radiation but is quite indurated.  The husband is asking about a biopsy and I agree.  The increased induration looks like a panniculitis.  Now there is also a nodule above the posterior popliteal fossa.  We will see if dermatology could resect that and biopsy the indurated area.   This is a pleasant 76 year old female with widely metastatic bone metastases, but no organ involvement at this time. She remains stable on the current regimen of fulvestrant, Xgeva and palbociclib, so I recommend that we continue this the same.  Her CA 27-29 is slowly increasing but still in the normal range.  However, she has a new finding in the left lower leg and the MRI of the knee and soft tissue was nonspecific. This may require a biopsy for further evaluation.  Her orthopedic doctor did not know what this was either.  It could be sequela from prior radiotherapy, but does not appear to be metastatic breast cancer.  We will continue her current systemic treatment.  The PET scan shows low-level hypermetabolic activity and is felt to be posttraumatic or postinflammatory.  She has stable widespread blastic osseous metastases and no new findings.  She will continue her injections every 4 weeks with fulvestrant and denosumab.  I will see her in 12 weeks with CBC, CMP and  CA 27.29 for repeat evaluation.  I will ask dermatology to consider biopsy of the posterior left knee.  She and her husband understand and agree with this plan of care. They know to call with any concerns regarding her breast cancer.  I provided 30 minutes of face-to-face time during this this encounter and > 50% was spent counseling as documented under my assessment and plan.    Derwood Kaplan, MD [facility] [address] [place] [address] Dept: [phone] Dept Fax: [phone]   CHIEF COMPLAINT:  CC: Metastatic breast cancer  Current Treatment:  Fulvestrant, Delton See and palbociclib   HISTORY OF PRESENT ILLNESS:  Jade Mooney is a 76 y.o. female who presents as a transfer of care from Dr. Lurline Del for the continued treatment and management of metastatic breast cancer. She was originally diagnosed in 1997, stage III, and underwent right mastectomy. She did receive adjuvant chemotherapy with Taxotere for 4 cycles, and also participated in a [facility] protocol with high-dose chemotherapy, cyclophosphamide/carboplatin/BCNU, followed by stem cell rescue in September 1997. She completed 5 years of tamoxifen in November 2002.  Unfortunately, she developed metastatic liver lesions which were biopsy proven metastatic breast cancer in November 2007. However, the tumor was estrogen receptor and HER2 positive, and so she was treated with Herceptin for a period of time.   In July 2015, she was found to have incidental findings of mixed lytic and sclerotic bony lesions on CT abdomen and pelvis. She underwent biopsy of the right iliac bone and surgical pathology confirmed invasive ductal carcinoma, grade 2, estrogen and progesterone receptor positive. Bone density from May 2015 was normal. She was started on zolendronic acid every 12 weeks in July 2015, but this was discontinued after January 2019. She was also found to have three scalp lesions, biopsied in July 2015 confirming metastatic breast cancer. HER2 negative. She was started on anastrozole in August 2015, and was switched to letrozole in April 2018. However, this was discontinued in February 2019 due to progression. She was started on fulvestrant in February 2019.  She developed a skin lesion of the right anterior chest wall in July 2015, and biopsy revealed adenocarcinoma. This was resected in  August 2017, and she received radiation, completed in December 2018. Palbociclib was added in January 2018 after PET imaging revealed 2 new foci at T8 and the manubrium. She received palliative radiation to the cervical spine in February 2019. Palbociclib was held during that time and resumed at 75 mg daily once completed. Delton See was started in March 2019.     I have reviewed her chart and materials related to her cancer extensively and collaborated history with the patient. Summary of oncologic history is as follows: Oncology History  Breast cancer metastasized to bone ([facility])  10/12/2013 Initial Diagnosis   Breast cancer metastasized to bone ([facility])   02/02/2020 -  Chemotherapy   Patient is on Treatment Plan : BREAST FULVESTRANT & XGEVA Q28D     Primary malignant neoplasm of breast with metastasis ([facility])  07/12/2015 Initial Diagnosis   Metastatic breast cancer ([facility])   02/02/2020 -  Chemotherapy   Patient is on Treatment Plan : BREAST FULVESTRANT & XGEVA Q28D     INTERVAL HISTORY:  [facility] transfered her care to [facility]. PET imaging from December 1st 2022 revealed no FDG-avid soft tissue metastasis; diffuse sclerotic osseous metastasis, without FDG affinity; similar left chest wall or medial left breast subcutaneous 5 mm nodule  with low-level hypermetabolism, favored benign and incidental. She continues palbociclib 75 mg every other day for 21 days on and 7 days off without difficulty. She obtains this through the outpatient [facility] pharmacy and needs refills.  She continues fulvestrant every 28 days since February 2019 and Xgeva without difficulty as well. She does complain of chronic continuous, especially of the right knee.  They are in the process of scheduling a right knee replacement.  She has had a swelling of her posterior left leg at the popliteal fossa with induration inferior to that of the calf. She did have a Doppler which was negative for deep venous thrombosis. This is mildly painful and an  MRI was very nonspecific with just skin thickening and subcutaneous edema at the posterior medial aspect of the proximal lower leg at the site of the swelling.  I had treated her with antibiotics in the event this could be cellulitis but this did not benefit her.  She previously had a bone lesion within the left fibular metadiaphysis which was radiated and now demonstrates predominantly fatty marrow signal consistent with fatty replacement of a treated osseous metastatic lesion.  I thought perhaps this could be postradiation effects but it seems to be worsening.  A PET scan was negative for metastatic disease.  This still showed her widespread sclerotic bone metastases but stable.  Her white count was 2.8 with an ANC of 1.54, just mildly lower than previous. The rest of her CBC and CMP are unremarkable and her calcium is good at 8.7. Her  appetite is good, and she has gained 3 pounds.  She denies fever, chills or other signs of infection.  She denies nausea, vomiting, bowel issues, or abdominal pain.  She denies sore throat, cough, dyspnea, or chest pain.  HISTORY:   Past Medical History:  Diagnosis Date   Anxiety    Cancer [facility])    Breast 1997 right tx with mastectomy and chemo, metastatic now   GERD (gastroesophageal reflux disease)    Hypercholesterolemia    Hypertension    Hypothyroidism    Osteoarthritis    oa   Personal history of chemotherapy    Personal history of radiation therapy    Secondary malignant neoplasm of bone and bone marrow ([facility])    TIA (transient ischemic attack) last 09/10/20   x 3 total, they seem to occur every 5 years    Past Surgical History:  Procedure Laterality Date   CATARACT EXTRACTION, BILATERAL     MASTECTOMY MODIFIED RADICAL Right 1997   porta cath insertion  1997   later removed   radiation tx  finished 02-25-16   x 20 tx   TONSILLECTOMY     TOTAL HIP ARTHROPLASTY Left 03/12/2016   Procedure: LEFT TOTAL HIP ARTHROPLASTY ANTERIOR APPROACH;  Surgeon:  Gaynelle Arabian, MD;  Location: [facility];  Service: Orthopedics;  Laterality: Left;   TOTAL KNEE ARTHROPLASTY Left    TUBAL LIGATION      Family History  Problem Relation Age of Onset   Breast cancer Maternal Aunt     Social History:  reports that she has never smoked. She has never used smokeless tobacco. She reports current alcohol use. She reports that she does not use drugs.The patient is accompanied by her husband [name] today. She is married and lives at home with her spouse. They are planning a cruise. She is retired from work, and has never been exposed to chemicals or other toxic agents.  Allergies:  Allergies  Allergen Reactions  Amoxicillin Hives    Has patient had a PCN reaction causing immediate rash, facial/tongue/throat swelling, SOB or lightheadedness with hypotension:unsure Has patient had a PCN reaction causing severe rash involving mucus membranes or skin necrosis:No Has patient had a PCN reaction that required hospitalization:No Has patient had a PCN reaction occurring within the last 10 years: Yes If all of the above answers are "NO", then may proceed with Cephalosporin use.    Erythromycin Other (See Comments)   Oxycodone    Oxycodone Hcl    Percocet [Oxycodone-Acetaminophen] Nausea And Vomiting   Percodan [Oxycodone-Aspirin] Nausea And Vomiting   Tramadol Itching    Current Medications: Current Outpatient Medications  Medication Sig Dispense Refill   acetaminophen (TYLENOL) 500 MG tablet Take 1,000 mg by mouth every 6 (six) hours as needed for mild pain. Reported on 04/17/2015     ascorbic acid (VITAMIN C) 1000 MG tablet Take 1,000 mg by mouth daily.     Aspirin 325 MG CAPS aspirin 325 mg capsule     beta carotene 10000 UNIT capsule Take 10,000 Units by mouth daily.     calcium citrate-vitamin D (CITRACAL+D) 315-200 MG-UNIT tablet Take 1 tablet by mouth in the morning and at bedtime.     cholecalciferol (VITAMIN D) 1000 units tablet Take 1,000 Units by mouth  daily.     clopidogrel (PLAVIX) 75 MG tablet Take 1 tablet (75 mg total) by mouth daily.     cyanocobalamin (,VITAMIN B-12,) 1000 MCG/ML injection Inject 1,000 mcg into the muscle once.     Denosumab (XGEVA [place]) Inject into the skin.     famotidine (PEPCID) 40 MG tablet Take 1 tablet (40 mg total) by mouth 2 (two) times daily.     Fulvestrant (FASLODEX IM) Inject 500 mg into the muscle.     glucosamine-chondroitin 500-400 MG tablet Take 1 tablet by mouth daily.     hydrocortisone (ANUSOL-HC) 2.5 % rectal cream APPLY TO AFFECTED AREA TWICE A DAY     ketotifen (ZADITOR) 0.025 % ophthalmic solution 1 drop as needed.     levothyroxine (SYNTHROID, LEVOTHROID) 88 MCG tablet Take 88 mcg by mouth daily before breakfast.     LORazepam (ATIVAN) 1 MG tablet Take 1 tablet (1 mg total) by mouth every 8 (eight) hours as needed for anxiety. 10 tablet 0   Lutein 20 MG CAPS Take 20 mg by mouth daily.     Magnesium (RA NATURAL MAGNESIUM) 250 MG TABS Take by mouth.     meclizine (ANTIVERT) 25 MG tablet Take 25 mg by mouth 3 (three) times daily as needed for dizziness.     metroNIDAZOLE (METROCREAM) 0.75 % cream metronidazole 0.75 % topical cream  apply TOPICALLY TWICE DAILY TO face as directed     Multiple Vitamins-Minerals (MULTIVITAMIN WITH MINERALS) tablet Take 1 tablet by mouth daily.     Omega-3 1000 MG CAPS Take 300-1,000 mg by mouth in the morning and at bedtime.     palbociclib (IBRANCE) 75 MG capsule Take 1 capsule (75 mg total) by mouth daily with breakfast. Take whole with food on days 1-21, every 28 days. (Patient taking differently: Take 75 mg by mouth every other day. Take whole every other day with food on days 1-21 with 7 days off, every 28 days.) 21 capsule 6   pantoprazole (PROTONIX) 40 MG tablet Take 40 mg by mouth 2 (two) times daily. Before breakfast and before supper     Polyethyl Glycol-Propyl Glycol 0.4-0.3 % SOLN Place 1-2 drops into both  eyes 2 (two) times daily.     rOPINIRole (REQUIP) 2  MG tablet Take 4 mg by mouth at bedtime.     rosuvastatin (CRESTOR) 20 MG tablet Take 1 tablet (20 mg total) by mouth daily.     SHINGRIX injection      vitamin E 180 MG (400 UNITS) capsule Take 180 Units by mouth daily.     zinc gluconate 50 MG tablet Take 50 mg by mouth daily.     No current facility-administered medications for this visit.    REVIEW OF SYSTEMS:  Review of Systems  Constitutional: Negative.  Negative for appetite change, chills, fatigue, fever and unexpected weight change.  HENT:  Negative.    Eyes: Negative.   Respiratory: Negative.  Negative for chest tightness, cough, hemoptysis, shortness of breath and wheezing.   Cardiovascular:  Positive for leg swelling (of the left medial leg near the popliteal fossa). Negative for chest pain and palpitations.       The posterior popliteal fossa has an increasing area of induration which is nontender with no obvious inflammation and looks similar to panniculitis to me.  She now also has a small nodule in the posterior leg just above the popliteal fossa.  Gastrointestinal: Negative.  Negative for abdominal distention, abdominal pain, blood in stool, constipation, diarrhea, nausea and vomiting.  Endocrine: Negative.   Genitourinary: Negative.  Negative for difficulty urinating, dysuria, frequency and hematuria.   Musculoskeletal:  Positive for arthralgias (diffuse). Negative for back pain, flank pain, gait problem and myalgias.  Skin: Negative.   Neurological: Negative.  Negative for dizziness, extremity weakness, gait problem, headaches, light-headedness, numbness, seizures and speech difficulty.  Hematological: Negative.   Psychiatric/Behavioral: Negative.  Negative for depression and sleep disturbance. The patient is not nervous/anxious.       VITALS:  Blood pressure (!) 162/83, pulse 86, temperature 98.1 F (36.7 C), temperature source Oral, resp. rate 18, height $RemoveBe'5\' 2"'JIOLxBjMe$  (1.575 m), weight 179 lb (81.2 kg), SpO2 97 %.  Wt  Readings from Last 3 Encounters:  09/09/21 179 lb (81.2 kg)  08/15/21 181 lb (82.1 kg)  07/18/21 178 lb 12 oz (81.1 kg)    Body mass index is 32.74 kg/m.  Performance status (ECOG): 1 - Symptomatic but completely ambulatory  PHYSICAL EXAM:  Physical Exam Constitutional:      General: She is not in acute distress.    Appearance: Normal appearance. She is normal weight.  HENT:     Head: Normocephalic and atraumatic.  Eyes:     General: No scleral icterus.    Extraocular Movements: Extraocular movements intact.     Conjunctiva/sclera: Conjunctivae normal.     Pupils: Pupils are equal, round, and reactive to light.  Cardiovascular:     Rate and Rhythm: Normal rate and regular rhythm.     Pulses: Normal pulses.     Heart sounds: Normal heart sounds. No murmur heard.    No friction rub. No gallop.  Pulmonary:     Effort: Pulmonary effort is normal. No respiratory distress.     Breath sounds: Normal breath sounds.  Chest:     Comments: Right mastectomy is negative and left breast is without masses. Abdominal:     General: Bowel sounds are normal. There is no distension.     Palpations: Abdomen is soft. There is no hepatomegaly, splenomegaly or mass.     Tenderness: There is no abdominal tenderness.  Musculoskeletal:        General: Normal range of motion.  Cervical back: Normal range of motion and neck supple.     Right lower leg: No edema.     Left lower leg: No edema.     Comments: Large nodules of her PIP joints of the hands consistent with advanced osteoarthritis. Left leg has induration of the calf with a mass effect of the lower popliteal fossa. This is not hot, erythematous or fluctuant.  She now has a small 1 cm nodule in the posterior left leg above the popliteal fossa  Lymphadenopathy:     Cervical: No cervical adenopathy.  Skin:    General: Skin is warm and dry.  Neurological:     General: No focal deficit present.     Mental Status: She is alert and oriented to  person, place, and time. Mental status is at baseline.  Psychiatric:        Mood and Affect: Mood normal.        Behavior: Behavior normal.        Thought Content: Thought content normal.        Judgment: Judgment normal.     LABS:      Latest Ref Rng & Units 09/09/2021   12:00 AM 08/13/2021   12:00 AM 06/18/2021   12:00 AM  CBC  WBC  2.8     3.0     2.6      Hemoglobin 12.0 - 16.0 12.2     12.2     12.5      Hematocrit 36 - 46 36     38     38      Platelets 150 - 400 K/uL 214     187     178         This result is from an external source.      Latest Ref Rng & Units 09/09/2021   12:00 AM 08/13/2021   12:00 AM 07/16/2021   12:00 AM  CMP  BUN 4 - $R'21 13     15     15      'fK$ Creatinine 0.5 - 1.1 0.7     0.8     0.7      Sodium 137 - 147 136     138     137      Potassium 3.5 - 5.1 mEq/L 4.1     4.5     4.1      Chloride 99 - 108 102     102     102      CO2 13 - $Re'22 23     27     24      'zDB$ Calcium 8.7 - 10.7 8.7     8.8     8.7      Alkaline Phos 25 - 125 52     57       AST 13 - 35 29     26       ALT 7 - 35 U/L 21     18          This result is from an external source.     Lab Results  Component Value Date   LDH 138 01/17/2011   LDH 155 12/28/2009   LDH 155 12/29/2008    STUDIES:    NUCLEAR MEDICINE PET WHOLE BODY 06/25/21 TECHNIQUE:  14.7 mCi F-18 FDG was injected intravenously. Full-ring PET imaging  was performed from the head to foot after the radiotracer. CT data  was obtained and used for attenuation correction and anatomic  localization.  Fasting blood glucose: NA mg/dl  COMPARISON: PET-CT 02/21/2021. Lower leg MRI 03/29/2021.  FINDINGS:  Mediastinal blood pool activity: SUV max 2.7  HEAD/ NECK:  No hypermetabolic cervical lymph nodes are identified.There are no  lesions of the pharyngeal mucosal space. Mild diffuse thyroid  activity, improved from previous study and likely physiologic.  Incidental CT findings: none  CHEST:  There are no hypermetabolic  mediastinal, hilar, axillary or internal  mammary lymph nodes. Small partially calcified subcutaneous nodule  medially in the left breast is again noted with low level  hypermetabolic activity, unchanged from the previous study. No  suspicious breast activity. There is also low level hypermetabolic  activity (SUV max 2.4) associated with a partially calcified lower  right paraspinal nodule (image 155/3), similar to previous study and  probably an osteophyte. No hypermetabolic pulmonary activity or  suspicious nodularity.  Incidental CT findings: Scattered pulmonary scarring bilaterally.  Mild aortic and coronary artery atherosclerosis. Previous right  mastectomy.  ABDOMEN/PELVIS:  There is no hypermetabolic activity within the liver, adrenal  glands, spleen or pancreas. There is no hypermetabolic nodal  activity.  Incidental CT findings: Ill-defined low-density hepatic lesions are  grossly stable, without hypermetabolic activity. Small gallstones  are present. There are diverticular changes throughout the colon and  mild aortic atherosclerosis.  SKELETON:  There is no hypermetabolic activity to suggest metabolically active  recurrent metastatic disease. Widespread blastic metastatic disease  appears grossly stable.  Incidental CT findings: As above, chronic widespread osseous  metastatic disease which appears grossly unchanged. Previous  cervical fusion and left total hip arthroplasty.  EXTREMITIES: There is no hypermetabolic activity within the bones to  suggest recurrent viable metastatic disease. Widespread treated  metastases are grossly stable. There are subcutaneous calcifications  posteromedially in the proximal left lower leg as seen on previous  MRI. These demonstrate low-level metabolic activity (SUV max 2.5).  No suspicious soft tissue masses.  Incidental CT findings: Status post left total knee arthroplasty.  Scattered vascular calcifications.  IMPRESSION:  1. No  evidence of local recurrent breast cancer or viable distant  metastatic disease.  2. Grossly stable widespread blastic osseous metastatic disease,  without hypermetabolic activity consistent with chronic treated  disease.  3. As seen on previous lower leg MRI, there is subcutaneous soft  tissue thickening and calcification posteromedially in the proximal  left lower leg with low level hypermetabolic activity, likely  posttraumatic or postinflammatory.  4. Incidental findings including cholelithiasis, colonic  diverticulosis, coronary and Aortic Atherosclerosis (ICD10-I70.0).  Electronically Signed  By: Richardean Sale M.D.  On: 06/26/2021 10:45       EXAM: 03/29/21 MRI OF THE LEFT TIBIA AND FIBULA WITHOUT AND WITH CONTRAST  TECHNIQUE:  Multiplanar, multisequence MR imaging of the left tibia and fibula  was performed both before and after administration of intravenous  contrast.  CONTRAST: 7.5 mL Gadavist IV contrast  COMPARISON: MRI 10/30/2014  FINDINGS:  Bones/Joint/Cartilage  Metallic susceptibility artifact related to patient's left knee  arthroplasty hardware degrades evaluation of the adjacent bony and  soft tissue structures the proximal aspect of the field of view.  This also results in regionally poor fat saturation which  unfortunately involves the area of clinical concern as demarcated by  a skin marker (series 14, image 8).  On the previous MRI of 10/30/2014, a bone lesion was visualized  within the left fibular metadiaphysis demonstrating low T1 marrow  signal. This area demonstrates predominantly fatty  T1 hyperintense  marrow signal on the current study (series 4, image 10), which may  represent fatty replacement of a treated osseous metastatic lesion.  Remaining osseous structures demonstrate no evidence of a marrow  replacing bone lesion. No bone marrow edema. No evidence of fracture  or malalignment within the limitations of this exam.  Ligaments  Not  characterized at the edge of the field of view.  Muscles and Tendons  Normal muscle bulk and signal intensity without edema, atrophy, or  fatty infiltration. Included tendinous structures appear intact and  unremarkable.  Soft tissues  Skin thickening and subcutaneous edema is seen at the posteromedial  aspect of the proximal lower leg at site of patient's clinical  concern. No organized fluid collection. No discernible solid or  cystic mass at this location. Evaluation for enhancement within the  tissues is unable to be assessed secondary to previously described  artifact. Relatively mild circumferential subcutaneous edema  throughout the remainder of the lower leg. Elsewhere, there is no  focal fluid collection or enhancing mass.   IMPRESSION:  1. Skin thickening and subcutaneous edema at the posteromedial  aspect of the proximal lower leg at site of patient's clinical  concern, nonspecific. Cellulitis could have this appearance in the  appropriate clinical setting. No fluid collection or discernible  mass is evident at this location. Evaluation of this location is  somewhat degraded by artifact, as discussed above.  2. On the previous MRI of 10/30/2014, a bone lesion was visualized  within the left fibular metadiaphysis demonstrating low T1 marrow  signal. This area demonstrates predominantly fatty marrow signal on  the current study, which may represent fatty replacement of a  treated osseous metastatic lesion. Remaining osseous structures  demonstrate no evidence of a marrow replacing bone lesion.   Electronically Signed  By: Davina Poke D.O.  On: 03/29/2021 10:45

## 2021-09-09 ENCOUNTER — Telehealth: Payer: Self-pay | Admitting: Oncology

## 2021-09-09 ENCOUNTER — Encounter: Payer: Self-pay | Admitting: Oncology

## 2021-09-09 ENCOUNTER — Other Ambulatory Visit: Payer: Self-pay

## 2021-09-09 ENCOUNTER — Other Ambulatory Visit: Payer: Self-pay | Admitting: Oncology

## 2021-09-09 ENCOUNTER — Inpatient Hospital Stay (INDEPENDENT_AMBULATORY_CARE_PROVIDER_SITE_OTHER): Payer: Medicare Other | Admitting: Oncology

## 2021-09-09 ENCOUNTER — Inpatient Hospital Stay: Payer: Medicare Other | Attending: Oncology

## 2021-09-09 VITALS — BP 162/83 | HR 86 | Temp 98.1°F | Resp 18 | Ht 62.0 in | Wt 179.0 lb

## 2021-09-09 DIAGNOSIS — C50919 Malignant neoplasm of unspecified site of unspecified female breast: Secondary | ICD-10-CM

## 2021-09-09 DIAGNOSIS — C50811 Malignant neoplasm of overlapping sites of right female breast: Secondary | ICD-10-CM

## 2021-09-09 DIAGNOSIS — C7951 Secondary malignant neoplasm of bone: Secondary | ICD-10-CM

## 2021-09-09 DIAGNOSIS — C787 Secondary malignant neoplasm of liver and intrahepatic bile duct: Secondary | ICD-10-CM | POA: Insufficient documentation

## 2021-09-09 DIAGNOSIS — Z170 Estrogen receptor positive status [ER+]: Secondary | ICD-10-CM

## 2021-09-09 DIAGNOSIS — E039 Hypothyroidism, unspecified: Secondary | ICD-10-CM | POA: Insufficient documentation

## 2021-09-09 DIAGNOSIS — Z9221 Personal history of antineoplastic chemotherapy: Secondary | ICD-10-CM | POA: Diagnosis not present

## 2021-09-09 DIAGNOSIS — Z5111 Encounter for antineoplastic chemotherapy: Secondary | ICD-10-CM | POA: Diagnosis not present

## 2021-09-09 DIAGNOSIS — Z923 Personal history of irradiation: Secondary | ICD-10-CM | POA: Insufficient documentation

## 2021-09-09 DIAGNOSIS — D649 Anemia, unspecified: Secondary | ICD-10-CM | POA: Diagnosis not present

## 2021-09-09 DIAGNOSIS — C50911 Malignant neoplasm of unspecified site of right female breast: Secondary | ICD-10-CM | POA: Diagnosis not present

## 2021-09-09 LAB — BASIC METABOLIC PANEL
BUN: 13 (ref 4–21)
CO2: 23 — AB (ref 13–22)
Chloride: 102 (ref 99–108)
Creatinine: 0.7 (ref 0.5–1.1)
Glucose: 135
Potassium: 4.1 mEq/L (ref 3.5–5.1)
Sodium: 136 — AB (ref 137–147)

## 2021-09-09 LAB — CBC AND DIFFERENTIAL
HCT: 36 (ref 36–46)
Hemoglobin: 12.2 (ref 12.0–16.0)
Neutrophils Absolute: 1.54
Platelets: 214 10*3/uL (ref 150–400)
WBC: 2.8

## 2021-09-09 LAB — HEPATIC FUNCTION PANEL
ALT: 21 U/L (ref 7–35)
AST: 29 (ref 13–35)
Alkaline Phosphatase: 52 (ref 25–125)
Bilirubin, Total: 0.6

## 2021-09-09 LAB — COMPREHENSIVE METABOLIC PANEL
Albumin: 4.5 (ref 3.5–5.0)
Calcium: 8.7 (ref 8.7–10.7)

## 2021-09-09 LAB — CBC: RBC: 4.06 (ref 3.87–5.11)

## 2021-09-09 NOTE — Telephone Encounter (Signed)
Per 09/09/21 los next appt scheduled and confirmed with patient 

## 2021-09-10 LAB — CANCER ANTIGEN 27.29: CA 27.29: 39.7 U/mL — ABNORMAL HIGH (ref 0.0–38.6)

## 2021-09-11 ENCOUNTER — Telehealth: Payer: Self-pay

## 2021-09-11 NOTE — Telephone Encounter (Signed)
-----   Message from Derwood Kaplan, MD sent at 09/10/2021  2:07 PM EDT ----- Regarding: call Tell her the CA 27.29 is 1 point above normal, but the PET done is April is stable so rec we keep monitoring her. We will hold her next Xgeva in anticipation of surgery I rec we hold the Ibrance for 2 weeks prior to knee surgery and 2 weeks after. Her note is done

## 2021-09-12 ENCOUNTER — Telehealth: Payer: Self-pay

## 2021-09-12 ENCOUNTER — Encounter: Payer: Self-pay | Admitting: Oncology

## 2021-09-12 ENCOUNTER — Other Ambulatory Visit: Payer: Self-pay | Admitting: Hematology and Oncology

## 2021-09-12 ENCOUNTER — Inpatient Hospital Stay: Payer: Medicare Other

## 2021-09-12 VITALS — BP 162/78 | HR 65 | Temp 98.1°F | Resp 18 | Ht 62.0 in | Wt 178.2 lb

## 2021-09-12 DIAGNOSIS — C50919 Malignant neoplasm of unspecified site of unspecified female breast: Secondary | ICD-10-CM

## 2021-09-12 DIAGNOSIS — C787 Secondary malignant neoplasm of liver and intrahepatic bile duct: Secondary | ICD-10-CM | POA: Diagnosis not present

## 2021-09-12 DIAGNOSIS — Z5111 Encounter for antineoplastic chemotherapy: Secondary | ICD-10-CM | POA: Diagnosis not present

## 2021-09-12 DIAGNOSIS — C801 Malignant (primary) neoplasm, unspecified: Secondary | ICD-10-CM

## 2021-09-12 DIAGNOSIS — Z170 Estrogen receptor positive status [ER+]: Secondary | ICD-10-CM | POA: Diagnosis not present

## 2021-09-12 DIAGNOSIS — C50911 Malignant neoplasm of unspecified site of right female breast: Secondary | ICD-10-CM | POA: Diagnosis not present

## 2021-09-12 DIAGNOSIS — C7951 Secondary malignant neoplasm of bone: Secondary | ICD-10-CM | POA: Diagnosis not present

## 2021-09-12 DIAGNOSIS — E039 Hypothyroidism, unspecified: Secondary | ICD-10-CM | POA: Diagnosis not present

## 2021-09-12 MED ORDER — DENOSUMAB 120 MG/1.7ML ~~LOC~~ SOLN
120.0000 mg | Freq: Once | SUBCUTANEOUS | Status: AC
  Administered 2021-09-12: 120 mg via SUBCUTANEOUS
  Filled 2021-09-12: qty 1.7

## 2021-09-12 MED ORDER — FULVESTRANT 250 MG/5ML IM SOSY
500.0000 mg | PREFILLED_SYRINGE | Freq: Once | INTRAMUSCULAR | Status: AC
  Administered 2021-09-12: 500 mg via INTRAMUSCULAR
  Filled 2021-09-12: qty 10

## 2021-09-12 MED ORDER — LORAZEPAM 1 MG PO TABS: 1.0000 mg | ORAL_TABLET | Freq: Three times a day (TID) | ORAL | 0 refills | Status: DC | PRN

## 2021-09-12 NOTE — Telephone Encounter (Signed)
Patient notified and voiced her understanding regarding holding her medications.  Has appointment with [facility] in July, 2023.

## 2021-09-12 NOTE — Telephone Encounter (Signed)
-----   Message from Derwood Kaplan, MD sent at 09/10/2021  2:07 PM EDT ----- Regarding: call Tell her the CA 27.29 is 1 point above normal, but the PET done is April is stable so rec we keep monitoring her. We will hold her next Xgeva in anticipation of surgery I rec we hold the Ibrance for 2 weeks prior to knee surgery and 2 weeks after. Her note is done

## 2021-09-12 NOTE — Patient Instructions (Signed)
Fulvestrant injection What is this medication? FULVESTRANT (ful VES trant) blocks the effects of estrogen. It is used to treat breast cancer. This medicine may be used for other purposes; ask your health care provider or pharmacist if you have questions. COMMON BRAND NAME(S): FASLODEX What should I tell my care team before I take this medication? They need to know if you have any of these conditions: bleeding disorders liver disease low blood counts, like low white cell, platelet, or red cell counts an unusual or allergic reaction to fulvestrant, other medicines, foods, dyes, or preservatives pregnant or trying to get pregnant breast-feeding How should I use this medication? This medicine is for injection into a muscle. It is usually given by a health care professional in a hospital or clinic setting. Talk to your pediatrician regarding the use of this medicine in children. Special care may be needed. Overdosage: If you think you have taken too much of this medicine contact a poison control center or emergency room at once. NOTE: This medicine is only for you. Do not share this medicine with others. What if I miss a dose? It is important not to miss your dose. Call your doctor or health care professional if you are unable to keep an appointment. What may interact with this medication? medicines that treat or prevent blood clots like warfarin, enoxaparin, dalteparin, apixaban, dabigatran, and rivaroxaban This list may not describe all possible interactions. Give your health care provider a list of all the medicines, herbs, non-prescription drugs, or dietary supplements you use. Also tell them if you smoke, drink alcohol, or use illegal drugs. Some items may interact with your medicine. What should I watch for while using this medication? Your condition will be monitored carefully while you are receiving this medicine. You will need important blood work done while you are taking this  medicine. Do not become pregnant while taking this medicine or for at least 1 year after stopping it. Women of child-bearing potential will need to have a negative pregnancy test before starting this medicine. Women should inform their doctor if they wish to become pregnant or think they might be pregnant. There is a potential for serious side effects to an unborn child. Men should inform their doctors if they wish to father a child. This medicine may lower sperm counts. Talk to your health care professional or pharmacist for more information. Do not breast-feed an infant while taking this medicine or for 1 year after the last dose. What side effects may I notice from receiving this medication? Side effects that you should report to your doctor or health care professional as soon as possible: allergic reactions like skin rash, itching or hives, swelling of the face, lips, or tongue feeling faint or lightheaded, falls pain, tingling, numbness, or weakness in the legs signs and symptoms of infection like fever or chills; cough; flu-like symptoms; sore throat vaginal bleeding Side effects that usually do not require medical attention (report to your doctor or health care professional if they continue or are bothersome): aches, pains constipation diarrhea headache hot flashes nausea, vomiting pain at site where injected stomach pain This list may not describe all possible side effects. Call your doctor for medical advice about side effects. You may report side effects to FDA at 1-800-FDA-1088. Where should I keep my medication? This drug is given in a hospital or clinic and will not be stored at home. NOTE: This sheet is a summary. It may not cover all possible information. If you have   questions about this medicine, talk to your doctor, pharmacist, or health care provider.  2023 Elsevier/Gold Standard (2017-06-23 00:00:00)  

## 2021-09-12 NOTE — Telephone Encounter (Signed)
Patient needs a refill on her Lorazepam and send it to [facility] on [address].

## 2021-09-16 DIAGNOSIS — M25561 Pain in right knee: Secondary | ICD-10-CM | POA: Diagnosis not present

## 2021-09-18 ENCOUNTER — Ambulatory Visit: Payer: Medicare Other | Admitting: Oncology

## 2021-09-18 ENCOUNTER — Other Ambulatory Visit: Payer: Medicare Other

## 2021-09-26 DIAGNOSIS — E538 Deficiency of other specified B group vitamins: Secondary | ICD-10-CM | POA: Diagnosis not present

## 2021-09-26 DIAGNOSIS — E559 Vitamin D deficiency, unspecified: Secondary | ICD-10-CM | POA: Diagnosis not present

## 2021-09-26 DIAGNOSIS — E039 Hypothyroidism, unspecified: Secondary | ICD-10-CM | POA: Diagnosis not present

## 2021-09-30 DIAGNOSIS — Z0000 Encounter for general adult medical examination without abnormal findings: Secondary | ICD-10-CM | POA: Diagnosis not present

## 2021-09-30 DIAGNOSIS — E782 Mixed hyperlipidemia: Secondary | ICD-10-CM | POA: Diagnosis not present

## 2021-09-30 DIAGNOSIS — I10 Essential (primary) hypertension: Secondary | ICD-10-CM | POA: Diagnosis not present

## 2021-09-30 DIAGNOSIS — C50911 Malignant neoplasm of unspecified site of right female breast: Secondary | ICD-10-CM | POA: Diagnosis not present

## 2021-09-30 DIAGNOSIS — M8589 Other specified disorders of bone density and structure, multiple sites: Secondary | ICD-10-CM | POA: Diagnosis not present

## 2021-09-30 DIAGNOSIS — E538 Deficiency of other specified B group vitamins: Secondary | ICD-10-CM | POA: Diagnosis not present

## 2021-10-01 DIAGNOSIS — M19071 Primary osteoarthritis, right ankle and foot: Secondary | ICD-10-CM | POA: Diagnosis not present

## 2021-10-01 DIAGNOSIS — M722 Plantar fascial fibromatosis: Secondary | ICD-10-CM | POA: Diagnosis not present

## 2021-10-01 DIAGNOSIS — M19072 Primary osteoarthritis, left ankle and foot: Secondary | ICD-10-CM | POA: Diagnosis not present

## 2021-10-07 ENCOUNTER — Other Ambulatory Visit: Payer: Self-pay | Admitting: Oncology

## 2021-10-07 DIAGNOSIS — Z170 Estrogen receptor positive status [ER+]: Secondary | ICD-10-CM

## 2021-10-08 ENCOUNTER — Inpatient Hospital Stay: Payer: Medicare Other | Attending: Oncology

## 2021-10-08 DIAGNOSIS — C50911 Malignant neoplasm of unspecified site of right female breast: Secondary | ICD-10-CM | POA: Insufficient documentation

## 2021-10-08 DIAGNOSIS — C7951 Secondary malignant neoplasm of bone: Secondary | ICD-10-CM | POA: Insufficient documentation

## 2021-10-08 DIAGNOSIS — C787 Secondary malignant neoplasm of liver and intrahepatic bile duct: Secondary | ICD-10-CM | POA: Diagnosis not present

## 2021-10-08 DIAGNOSIS — Z170 Estrogen receptor positive status [ER+]: Secondary | ICD-10-CM | POA: Insufficient documentation

## 2021-10-08 DIAGNOSIS — Z5111 Encounter for antineoplastic chemotherapy: Secondary | ICD-10-CM | POA: Insufficient documentation

## 2021-10-08 DIAGNOSIS — C50919 Malignant neoplasm of unspecified site of unspecified female breast: Secondary | ICD-10-CM | POA: Diagnosis not present

## 2021-10-08 LAB — CBC AND DIFFERENTIAL
HCT: 38 (ref 36–46)
Hemoglobin: 12.4 (ref 12.0–16.0)
Neutrophils Absolute: 1.65
Platelets: 195 10*3/uL (ref 150–400)
WBC: 3

## 2021-10-08 LAB — BASIC METABOLIC PANEL
BUN: 14 (ref 4–21)
CO2: 24 — AB (ref 13–22)
Chloride: 104 (ref 99–108)
Creatinine: 0.8 (ref 0.5–1.1)
Glucose: 105
Potassium: 4.2 mEq/L (ref 3.5–5.1)
Sodium: 135 — AB (ref 137–147)

## 2021-10-08 LAB — HEPATIC FUNCTION PANEL
ALT: 19 U/L (ref 7–35)
AST: 33 (ref 13–35)
Alkaline Phosphatase: 54 (ref 25–125)
Bilirubin, Total: 0.5

## 2021-10-08 LAB — COMPREHENSIVE METABOLIC PANEL
Albumin: 4.3 (ref 3.5–5.0)
Calcium: 9 (ref 8.7–10.7)

## 2021-10-08 LAB — CBC: RBC: 4.13 (ref 3.87–5.11)

## 2021-10-09 ENCOUNTER — Encounter: Payer: Self-pay | Admitting: Oncology

## 2021-10-09 DIAGNOSIS — L814 Other melanin hyperpigmentation: Secondary | ICD-10-CM | POA: Diagnosis not present

## 2021-10-09 DIAGNOSIS — D225 Melanocytic nevi of trunk: Secondary | ICD-10-CM | POA: Diagnosis not present

## 2021-10-09 DIAGNOSIS — D2239 Melanocytic nevi of other parts of face: Secondary | ICD-10-CM | POA: Diagnosis not present

## 2021-10-09 DIAGNOSIS — L821 Other seborrheic keratosis: Secondary | ICD-10-CM | POA: Diagnosis not present

## 2021-10-09 LAB — CANCER ANTIGEN 27.29: CA 27.29: 36.5 U/mL (ref 0.0–38.6)

## 2021-10-10 ENCOUNTER — Encounter: Payer: Self-pay | Admitting: Oncology

## 2021-10-10 ENCOUNTER — Inpatient Hospital Stay: Payer: Medicare Other

## 2021-10-10 VITALS — BP 140/72 | HR 57 | Temp 98.1°F | Resp 18 | Ht 62.0 in | Wt 178.2 lb

## 2021-10-10 DIAGNOSIS — C50919 Malignant neoplasm of unspecified site of unspecified female breast: Secondary | ICD-10-CM

## 2021-10-10 DIAGNOSIS — Z170 Estrogen receptor positive status [ER+]: Secondary | ICD-10-CM | POA: Diagnosis not present

## 2021-10-10 DIAGNOSIS — C787 Secondary malignant neoplasm of liver and intrahepatic bile duct: Secondary | ICD-10-CM | POA: Diagnosis not present

## 2021-10-10 DIAGNOSIS — C50911 Malignant neoplasm of unspecified site of right female breast: Secondary | ICD-10-CM | POA: Diagnosis not present

## 2021-10-10 DIAGNOSIS — C7951 Secondary malignant neoplasm of bone: Secondary | ICD-10-CM | POA: Diagnosis not present

## 2021-10-10 DIAGNOSIS — Z5111 Encounter for antineoplastic chemotherapy: Secondary | ICD-10-CM | POA: Diagnosis not present

## 2021-10-10 MED ORDER — FULVESTRANT 250 MG/5ML IM SOSY
500.0000 mg | PREFILLED_SYRINGE | Freq: Once | INTRAMUSCULAR | Status: AC
  Administered 2021-10-10: 500 mg via INTRAMUSCULAR
  Filled 2021-10-10: qty 10

## 2021-10-10 NOTE — Patient Instructions (Signed)
Fulvestrant injection What is this medication? FULVESTRANT (ful VES trant) blocks the effects of estrogen. It is used to treat breast cancer. This medicine may be used for other purposes; ask your health care provider or pharmacist if you have questions. COMMON BRAND NAME(S): FASLODEX What should I tell my care team before I take this medication? They need to know if you have any of these conditions: bleeding disorders liver disease low blood counts, like low white cell, platelet, or red cell counts an unusual or allergic reaction to fulvestrant, other medicines, foods, dyes, or preservatives pregnant or trying to get pregnant breast-feeding How should I use this medication? This medicine is for injection into a muscle. It is usually given by a health care professional in a hospital or clinic setting. Talk to your pediatrician regarding the use of this medicine in children. Special care may be needed. Overdosage: If you think you have taken too much of this medicine contact a poison control center or emergency room at once. NOTE: This medicine is only for you. Do not share this medicine with others. What if I miss a dose? It is important not to miss your dose. Call your doctor or health care professional if you are unable to keep an appointment. What may interact with this medication? medicines that treat or prevent blood clots like warfarin, enoxaparin, dalteparin, apixaban, dabigatran, and rivaroxaban This list may not describe all possible interactions. Give your health care provider a list of all the medicines, herbs, non-prescription drugs, or dietary supplements you use. Also tell them if you smoke, drink alcohol, or use illegal drugs. Some items may interact with your medicine. What should I watch for while using this medication? Your condition will be monitored carefully while you are receiving this medicine. You will need important blood work done while you are taking this  medicine. Do not become pregnant while taking this medicine or for at least 1 year after stopping it. Women of child-bearing potential will need to have a negative pregnancy test before starting this medicine. Women should inform their doctor if they wish to become pregnant or think they might be pregnant. There is a potential for serious side effects to an unborn child. Men should inform their doctors if they wish to father a child. This medicine may lower sperm counts. Talk to your health care professional or pharmacist for more information. Do not breast-feed an infant while taking this medicine or for 1 year after the last dose. What side effects may I notice from receiving this medication? Side effects that you should report to your doctor or health care professional as soon as possible: allergic reactions like skin rash, itching or hives, swelling of the face, lips, or tongue feeling faint or lightheaded, falls pain, tingling, numbness, or weakness in the legs signs and symptoms of infection like fever or chills; cough; flu-like symptoms; sore throat vaginal bleeding Side effects that usually do not require medical attention (report to your doctor or health care professional if they continue or are bothersome): aches, pains constipation diarrhea headache hot flashes nausea, vomiting pain at site where injected stomach pain This list may not describe all possible side effects. Call your doctor for medical advice about side effects. You may report side effects to FDA at 1-800-FDA-1088. Where should I keep my medication? This drug is given in a hospital or clinic and will not be stored at home. NOTE: This sheet is a summary. It may not cover all possible information. If you have   questions about this medicine, talk to your doctor, pharmacist, or health care provider.  2023 Elsevier/Gold Standard (2017-06-23 00:00:00)  

## 2021-10-10 NOTE — Progress Notes (Signed)
Sent message, via epic in basket, requesting order in epic from surgeon     10/10/21 1106  Preop Orders  Has preop orders? No  Name of staff/physician contacted for orders(Indicate phone or IB message) L. Haus, PA-C.

## 2021-10-10 NOTE — H&P (View-Only) (Signed)
Sent message, via epic in basket, requesting order in epic from surgeon     10/10/21 1106  Preop Orders  Has preop orders? No  Name of staff/physician contacted for orders(Indicate phone or IB message) L. Haus, PA-C.

## 2021-10-14 ENCOUNTER — Other Ambulatory Visit: Payer: Self-pay

## 2021-10-14 ENCOUNTER — Telehealth: Payer: Self-pay

## 2021-10-14 NOTE — Telephone Encounter (Signed)
-----   Message from Derwood Kaplan, MD sent at 10/10/2021  6:49 PM EDT ----- Regarding: call I see her CA 27.29 went back down from 39.7 to 36.5.

## 2021-10-14 NOTE — Telephone Encounter (Signed)
Patient notified

## 2021-10-16 ENCOUNTER — Encounter (HOSPITAL_COMMUNITY): Payer: Self-pay

## 2021-10-16 NOTE — Patient Instructions (Addendum)
DUE TO COVID-19 ONLY TWO VISITORS  (aged 77 and older)  ARE ALLOWED TO COME WITH YOU AND STAY IN THE WAITING ROOM ONLY DURING PRE OP AND PROCEDURE.   **NO VISITORS ARE ALLOWED IN THE SHORT STAY AREA OR RECOVERY ROOM!!**  IF YOU WILL BE ADMITTED INTO THE HOSPITAL YOU ARE ALLOWED ONLY FOUR SUPPORT PEOPLE DURING VISITATION HOURS ONLY (7 AM -8PM)   The support person(s) must pass our screening, gel in and out, and wear a mask at all times, including in the patient's room. Patients must also wear a mask when staff or their support person are in the room. Visitors GUEST BADGE MUST BE WORN VISIBLY  One adult visitor may remain with you overnight and MUST be in the room by 8 P.M.     Your procedure is scheduled on: 10/30/21   Report to [facility] Main Entrance    Report to admitting at : 9:00 AM   Call this number if you have problems the morning of surgery [phone]   Do not eat food :After Midnight.   After Midnight you may have the following liquids until : 8:00 AM DAY OF SURGERY  Water Black Coffee (sugar ok, NO MILK/CREAM OR CREAMERS)  Tea (sugar ok, NO MILK/CREAM OR CREAMERS) regular and decaf                             Plain Jell-O (NO RED)                                           Fruit ices (not with fruit pulp, NO RED)                                     Popsicles (NO RED)                                                                  Juice: apple, WHITE grape, WHITE cranberry Sports drinks like Gatorade (NO RED)   Oral Hygiene is also important to reduce your risk of infection.                                    Remember - BRUSH YOUR TEETH THE MORNING OF SURGERY WITH YOUR REGULAR TOOTHPASTE   Do NOT smoke after Midnight   Take these medicines the morning of surgery with A SIP OF WATER: ropinirole,synthroid,famotidine,pantoprazole.Eye drops as usual.Tylenol,lorazepam as needed.  DO NOT TAKE ANY ORAL DIABETIC MEDICATIONS DAY OF YOUR SURGERY  Bring CPAP mask and  tubing day of surgery.                              You may not have any metal on your body including hair pins, jewelry, and body piercing             Do not wear make-up, lotions, powders, perfumes/cologne, or deodorant  Do not wear  nail polish including gel and S&S, artificial/acrylic nails, or any other type of covering on natural nails including finger and toenails. If you have artificial nails, gel coating, etc. that needs to be removed by a nail salon please have this removed prior to surgery or surgery may need to be canceled/ delayed if the surgeon/ anesthesia feels like they are unable to be safely monitored.   Do not shave  48 hours prior to surgery.    Do not bring valuables to the hospital. [place].   Contacts, dentures or bridgework may not be worn into surgery.   Bring small overnight bag day of surgery.   DO NOT [place]. PHARMACY WILL DISPENSE MEDICATIONS LISTED ON YOUR MEDICATION LIST TO YOU DURING YOUR ADMISSION [place]!    Patients discharged on the day of surgery will not be allowed to drive home.  Someone NEEDS to stay with you for the first 24 hours after anesthesia.   Special Instructions: Bring a copy of your healthcare power of attorney and living will documents         the day of surgery if you haven't scanned them before.              Please read over the following fact sheets you were given: IF YOU HAVE QUESTIONS ABOUT YOUR PRE-OP INSTRUCTIONS PLEASE CALL [phone]     [facility] - Preparing for Surgery Before surgery, you can play an important role.  Because skin is not sterile, your skin needs to be as free of germs as possible.  You can reduce the number of germs on your skin by washing with CHG (chlorahexidine gluconate) soap before surgery.  CHG is an antiseptic cleaner which kills germs and bonds with the skin to continue killing germs even after  washing. Please DO NOT use if you have an allergy to CHG or antibacterial soaps.  If your skin becomes reddened/irritated stop using the CHG and inform your nurse when you arrive at Short Stay. Do not shave (including legs and underarms) for at least 48 hours prior to the first CHG shower.  You may shave your face/neck. Please follow these instructions carefully:  1.  Shower with CHG Soap the night before surgery and the  morning of Surgery.  2.  If you choose to wash your hair, wash your hair first as usual with your  normal  shampoo.  3.  After you shampoo, rinse your hair and body thoroughly to remove the  shampoo.                           4.  Use CHG as you would any other liquid soap.  You can apply chg directly  to the skin and wash                       Gently with a scrungie or clean washcloth.  5.  Apply the CHG Soap to your body ONLY FROM THE NECK DOWN.   Do not use on face/ open                           Wound or open sores. Avoid contact with eyes, ears mouth and genitals (private parts).  Wash face,  Genitals (private parts) with your normal soap.             6.  Wash thoroughly, paying special attention to the area where your surgery  will be performed.  7.  Thoroughly rinse your body with warm water from the neck down.  8.  DO NOT shower/wash with your normal soap after using and rinsing off  the CHG Soap.                9.  Pat yourself dry with a clean towel.            10.  Wear clean pajamas.            11.  Place clean sheets on your bed the night of your first shower and do not  sleep with pets. Day of Surgery : Do not apply any lotions/deodorants the morning of surgery.  Please wear clean clothes to the hospital/surgery center.  FAILURE TO FOLLOW THESE INSTRUCTIONS MAY RESULT IN THE CANCELLATION OF YOUR SURGERY PATIENT SIGNATURE_________________________________  NURSE  SIGNATURE__________________________________  ________________________________________________________________________

## 2021-10-17 ENCOUNTER — Encounter (HOSPITAL_COMMUNITY): Payer: Self-pay

## 2021-10-17 ENCOUNTER — Encounter (HOSPITAL_COMMUNITY)
Admission: RE | Admit: 2021-10-17 | Discharge: 2021-10-17 | Disposition: A | Discharge status: Home / Self Care | Payer: Medicare Other | Source: Ambulatory Visit | Attending: Specialist | Admitting: Specialist

## 2021-10-17 ENCOUNTER — Other Ambulatory Visit: Payer: Self-pay

## 2021-10-17 VITALS — BP 156/92 | HR 82 | Temp 98.0°F | Ht 62.0 in | Wt 177.0 lb

## 2021-10-17 DIAGNOSIS — Z01818 Encounter for other preprocedural examination: Secondary | ICD-10-CM | POA: Insufficient documentation

## 2021-10-17 DIAGNOSIS — G459 Transient cerebral ischemic attack, unspecified: Secondary | ICD-10-CM | POA: Insufficient documentation

## 2021-10-17 LAB — SURGICAL PCR SCREEN
MRSA, PCR: NEGATIVE
Staphylococcus aureus: NEGATIVE

## 2021-10-17 NOTE — Progress Notes (Signed)
For Short Stay: [place] appointment date: Date of COVID positive in last 79 days:  Bowel Prep reminder:   For Anesthesia: PCP - Dr. Christa See Cardiologist - N/A  Chest x-ray -  EKG -  Stress Test -  ECHO - 01/07/21 Cardiac Cath -  Pacemaker/ICD device last checked: Pacemaker orders received: Device Rep notified:  Spinal Cord Stimulator:  Sleep Study -  CPAP -   Fasting Blood Sugar -  Checks Blood Sugar _____ times a day Date and result of last Hgb A1c-  Blood Thinner Instructions: Plavix and aspirin: hold 5 days before surgery: Dr. Theda Sers. Aspirin Instructions: Last Dose:  Activity level: Can go up a flight of stairs and activities of daily living without stopping and without chest pain and/or shortness of breath   Able to exercise without chest pain and/or shortness of breath   Unable to go up a flight of stairs without chest pain and/or shortness of breath     Anesthesia review: Hx: HTN,TIA's.  Patient denies shortness of breath, fever, cough and chest pain at PAT appointment   Patient verbalized understanding of instructions that were given to them at the PAT appointment. Patient was also instructed that they will need to review over the PAT instructions again at home before surgery.

## 2021-10-21 ENCOUNTER — Encounter: Payer: Self-pay | Admitting: Oncology

## 2021-10-21 DIAGNOSIS — I10 Essential (primary) hypertension: Secondary | ICD-10-CM | POA: Diagnosis not present

## 2021-10-21 DIAGNOSIS — E538 Deficiency of other specified B group vitamins: Secondary | ICD-10-CM | POA: Diagnosis not present

## 2021-10-21 DIAGNOSIS — E782 Mixed hyperlipidemia: Secondary | ICD-10-CM | POA: Diagnosis not present

## 2021-10-23 ENCOUNTER — Telehealth: Payer: Self-pay

## 2021-10-23 DIAGNOSIS — E538 Deficiency of other specified B group vitamins: Secondary | ICD-10-CM | POA: Diagnosis not present

## 2021-10-23 NOTE — Telephone Encounter (Signed)
Oral Chemotherapy Pharmacist Encounter   Dispensed samples to patient:   Medication: Ibrance 75 mg tablets Instructions: Take 1 tablet by mouth every other day for 21 days on, 7 days off, repeat every 28 days Quantity dispensed: 42 Days supply: 101 Manufacturer: [place] Lot: [id number] Exp: 03/24/2023   Drema Halon, PharmD Hematology/Oncology Clinical Pharmacist [facility] [phone]

## 2021-10-25 NOTE — H&P (Signed)
TOTAL KNEE ADMISSION H&P  Patient is being admitted for right total knee arthroplasty.  Subjective:  Chief Complaint:right knee pain.  HPI: Jade Mooney, 76 y.o. female, has a history of pain and functional disability in the right knee due to arthritis and has failed non-surgical conservative treatments for greater than 12 weeks to includeNSAID's and/or analgesics, corticosteriod injections, viscosupplementation injections, and supervised PT with diminished ADL's post treatment.  Onset of symptoms was gradual, starting 2 years ago with gradually worsening course since that time. The patient noted no past surgery on the right knee(s).  Patient currently rates pain in the right knee(s) at 5 out of 10 with activity. Patient has night pain, worsening of pain with activity and weight bearing, pain that interferes with activities of daily living, and joint swelling.  Patient has evidence of periarticular osteophytes and joint space narrowing by imaging studies. This patient has had  No previous injury . There is no active infection.  Patient Active Problem List   Diagnosis Date Noted   Aortic atherosclerosis ([place]) 08/24/2017   Malignant neoplasm of overlapping sites of right breast in female, estrogen receptor positive ([name]) 04/21/2016   Goals of care, counseling/discussion 04/21/2016   OA (osteoarthritis) of hip 03/12/2016   Pain from bone metastases ([place]) 10/02/2015   Primary malignant neoplasm of breast with metastasis ([name]) 07/12/2015   Breast cancer metastasized to bone ([name]) 10/12/2013   TIA (transient ischemic attack)    Past Medical History:  Diagnosis Date   Anxiety    Cancer [facility])    Breast 1997 right tx with mastectomy and chemo, metastatic now   GERD (gastroesophageal reflux disease)    Hypercholesterolemia    Hypertension    Hypothyroidism    Osteoarthritis    oa   Personal history of chemotherapy    Personal history of radiation therapy    Secondary malignant  neoplasm of bone and bone marrow ([name])    TIA (transient ischemic attack) last 09/10/20   x 3 total, they seem to occur every 5 years    Past Surgical History:  Procedure Laterality Date   CATARACT EXTRACTION, BILATERAL     CERVICAL FUSION  2020   MASTECTOMY MODIFIED RADICAL Right 1997   porta cath insertion  1997   later removed   radiation tx  finished 02-25-16   x 20 tx   TONSILLECTOMY     TOTAL HIP ARTHROPLASTY Left 03/12/2016   Procedure: LEFT TOTAL HIP ARTHROPLASTY ANTERIOR APPROACH;  Surgeon: Gaynelle Arabian, MD;  Location: [facility] ORS;  Service: Orthopedics;  Laterality: Left;   TOTAL KNEE ARTHROPLASTY Left    TUBAL LIGATION      No current facility-administered medications for this encounter.   Current Outpatient Medications  Medication Sig Dispense Refill Last Dose   acetaminophen (TYLENOL) 500 MG tablet Take 1,000 mg by mouth every 6 (six) hours as needed for mild pain.      acidophilus (RISAQUAD) CAPS capsule Take 1 capsule by mouth daily.      ascorbic acid (VITAMIN C) 1000 MG tablet Take 1,000 mg by mouth daily.      aspirin 325 MG tablet Take 325 mg by mouth daily.      beta carotene 15 MG capsule Take 15 mg by mouth daily.      Biotin 5000 MCG TABS Take 5,000 mcg by mouth daily.      calcium citrate-vitamin D (CITRACAL+D) 315-200 MG-UNIT tablet Take 1 tablet by mouth in the morning and at bedtime.  cholecalciferol (VITAMIN D) 1000 units tablet Take 1,000 Units by mouth daily.      clopidogrel (PLAVIX) 75 MG tablet Take 1 tablet (75 mg total) by mouth daily.      cyanocobalamin (,VITAMIN B-12,) 1000 MCG/ML injection Inject 1,000 mcg into the muscle every 30 (thirty) days.      Denosumab (XGEVA [place]) Inject 1 Dose into the skin every 30 (thirty) days.      famotidine (PEPCID) 40 MG tablet Take 40 mg by mouth daily.      glucosamine-chondroitin 500-400 MG tablet Take 1 tablet by mouth daily.      ketotifen (ZADITOR) 0.025 % ophthalmic solution Place 1 drop into both eyes  daily as needed (allergies).      levothyroxine (SYNTHROID, LEVOTHROID) 88 MCG tablet Take 88 mcg by mouth daily before breakfast.      LORazepam (ATIVAN) 1 MG tablet Take 1 tablet (1 mg total) by mouth every 8 (eight) hours as needed for anxiety. 30 tablet 0    Lutein 20 MG CAPS Take 20 mg by mouth daily.      Magnesium (RA NATURAL MAGNESIUM) 250 MG TABS Take 250 mg by mouth daily.      meclizine (ANTIVERT) 25 MG tablet Take 25 mg by mouth 3 (three) times daily as needed for dizziness.      metroNIDAZOLE (METROCREAM) 0.75 % cream Apply 1 Application topically daily.      Multiple Vitamins-Minerals (MULTIVITAMIN WITH MINERALS) tablet Take 1 tablet by mouth daily.      Omega-3 1000 MG CAPS Take 1,000 mg by mouth in the morning and at bedtime.      palbociclib (IBRANCE) 75 MG capsule Take 1 capsule (75 mg total) by mouth daily with breakfast. Take whole with food on days 1-21, every 28 days. (Patient taking differently: Take 75 mg by mouth See admin instructions. Take 75 mg by mouth every other day with food on days 1-21 with 7 days off, every 28 days.) 21 capsule 6    pantoprazole (PROTONIX) 40 MG tablet Take 40 mg by mouth 2 (two) times daily. Before breakfast and before supper      Polyethyl Glycol-Propyl Glycol 0.4-0.3 % SOLN Place 1-2 drops into both eyes 2 (two) times daily.      rOPINIRole (REQUIP) 2 MG tablet Take 4 mg by mouth at bedtime.      rosuvastatin (CRESTOR) 20 MG tablet Take 1 tablet (20 mg total) by mouth daily.      vitamin E 180 MG (400 UNITS) capsule Take 400 Units by mouth daily.      zinc gluconate 50 MG tablet Take 50 mg by mouth daily.      Allergies  Allergen Reactions   Amoxicillin Hives    Has patient had a PCN reaction causing immediate rash, facial/tongue/throat swelling, SOB or lightheadedness with hypotension:unsure Has patient had a PCN reaction causing severe rash involving mucus membranes or skin necrosis:No Has patient had a PCN reaction that required  hospitalization:No Has patient had a PCN reaction occurring within the last 10 years: Yes If all of the above answers are "NO", then may proceed with Cephalosporin use.    Erythromycin Other (See Comments)   Oxycodone Hcl Nausea And Vomiting   Percocet [Oxycodone-Acetaminophen] Nausea And Vomiting   Percodan [Oxycodone-Aspirin] Nausea And Vomiting   Tramadol Itching    Social History   Tobacco Use   Smoking status: Never   Smokeless tobacco: Never  Substance Use Topics   Alcohol use: Yes  Comment: 1 glass wine 4-5 x week    Family History  Problem Relation Age of Onset   Breast cancer Maternal Aunt      Review of Systems  All other systems reviewed and are negative.   Objective:  Physical Exam Constitutional:      Appearance: Normal appearance.  HENT:     Head: Normocephalic and atraumatic.  Cardiovascular:     Rate and Rhythm: Normal rate and regular rhythm.     Pulses: Normal pulses.     Heart sounds: Normal heart sounds.  Pulmonary:     Effort: Pulmonary effort is normal.     Breath sounds: Normal breath sounds.  Musculoskeletal:     Comments: Musculoskeletal: On exam of their right knee, no skin changes or effusions noted. Patient has tenderness with palpation over the medial and lateral joint spaces. Full extension, full flexion. Crepitus under the patella with flexion and extension.No laxity of varus valgus pressure. 5 out of 5 strength with resisted knee flexion and extension. Calf is supple. Neurovascularly intact in their right lower extremity.   Skin:    General: Skin is warm and dry.     Capillary Refill: Capillary refill takes less than 2 seconds.  Neurological:     General: No focal deficit present.     Mental Status: She is alert and oriented to person, place, and time.  Psychiatric:        Mood and Affect: Mood normal.        Behavior: Behavior normal.     Vital signs in last 24 hours:    Labs:   Estimated body mass index is 32.37 kg/m  as calculated from the following:   Height as of 10/17/21: '5\' 2"'$  (1.575 m).   Weight as of 10/17/21: 80.3 kg.   Imaging Review Plain radiographs demonstrate severe degenerative joint disease of the right knee(s). The overall alignment issignificant varus. The bone quality appears to be good for age and reported activity level.      Assessment/Plan:  End stage arthritis, right knee   The patient history, physical examination, clinical judgment of the provider and imaging studies are consistent with end stage degenerative joint disease of the right knee(s) and total knee arthroplasty is deemed medically necessary. The treatment options including medical management, injection therapy arthroscopy and arthroplasty were discussed at length. The risks and benefits of total knee arthroplasty were presented and reviewed. The risks due to aseptic loosening, infection, stiffness, patella tracking problems, thromboembolic complications and other imponderables were discussed. The patient acknowledged the explanation, agreed to proceed with the plan and consent was signed. Patient is being admitted for inpatient treatment for surgery, pain control, PT, OT, prophylactic antibiotics, VTE prophylaxis, progressive ambulation and ADL's and discharge planning. The patient is planning to be discharged  Home with family and will be doing OPPT     Patient's anticipated LOS is less than 2 midnights, meeting these requirements: - Younger than 68 - Lives within 1 hour of care - Has a competent adult at home to recover with post-op recover - NO history of  - Chronic pain requiring opiods  - Diabetes  - Coronary Artery Disease  - Heart failure  - Heart attack  - Stroke  - DVT/VTE  - Cardiac arrhythmia  - Respiratory Failure/COPD  - Renal failure  - Anemia  - Advanced Liver disease

## 2021-10-26 ENCOUNTER — Other Ambulatory Visit: Payer: Self-pay

## 2021-10-30 ENCOUNTER — Observation Stay (HOSPITAL_COMMUNITY)
Admission: RE | Admit: 2021-10-30 | Discharge: 2021-10-31 | Disposition: A | Discharge status: Home / Self Care | Payer: Medicare Other | Attending: Specialist | Admitting: Specialist

## 2021-10-30 ENCOUNTER — Encounter (HOSPITAL_COMMUNITY)
Admission: RE | Disposition: A | Discharge status: Home / Self Care | Payer: Self-pay | Source: Home / Self Care | Attending: Specialist

## 2021-10-30 ENCOUNTER — Encounter (HOSPITAL_COMMUNITY): Payer: Self-pay | Admitting: Specialist

## 2021-10-30 ENCOUNTER — Other Ambulatory Visit: Payer: Self-pay

## 2021-10-30 ENCOUNTER — Ambulatory Visit (HOSPITAL_BASED_OUTPATIENT_CLINIC_OR_DEPARTMENT_OTHER): Payer: Medicare Other | Admitting: Anesthesiology

## 2021-10-30 ENCOUNTER — Ambulatory Visit (HOSPITAL_COMMUNITY): Payer: Medicare Other | Admitting: Physician Assistant

## 2021-10-30 DIAGNOSIS — I10 Essential (primary) hypertension: Secondary | ICD-10-CM

## 2021-10-30 DIAGNOSIS — Z01818 Encounter for other preprocedural examination: Secondary | ICD-10-CM

## 2021-10-30 DIAGNOSIS — E039 Hypothyroidism, unspecified: Secondary | ICD-10-CM

## 2021-10-30 DIAGNOSIS — M1990 Unspecified osteoarthritis, unspecified site: Secondary | ICD-10-CM

## 2021-10-30 DIAGNOSIS — Z96652 Presence of left artificial knee joint: Secondary | ICD-10-CM | POA: Insufficient documentation

## 2021-10-30 DIAGNOSIS — Z7982 Long term (current) use of aspirin: Secondary | ICD-10-CM | POA: Diagnosis not present

## 2021-10-30 DIAGNOSIS — M1711 Unilateral primary osteoarthritis, right knee: Secondary | ICD-10-CM

## 2021-10-30 DIAGNOSIS — Z7902 Long term (current) use of antithrombotics/antiplatelets: Secondary | ICD-10-CM | POA: Insufficient documentation

## 2021-10-30 DIAGNOSIS — Z8673 Personal history of transient ischemic attack (TIA), and cerebral infarction without residual deficits: Secondary | ICD-10-CM | POA: Insufficient documentation

## 2021-10-30 DIAGNOSIS — Z79899 Other long term (current) drug therapy: Secondary | ICD-10-CM | POA: Diagnosis not present

## 2021-10-30 DIAGNOSIS — G8918 Other acute postprocedural pain: Secondary | ICD-10-CM | POA: Diagnosis not present

## 2021-10-30 DIAGNOSIS — Z96642 Presence of left artificial hip joint: Secondary | ICD-10-CM | POA: Insufficient documentation

## 2021-10-30 DIAGNOSIS — Z853 Personal history of malignant neoplasm of breast: Secondary | ICD-10-CM | POA: Diagnosis not present

## 2021-10-30 HISTORY — PX: TOTAL KNEE ARTHROPLASTY: SHX125

## 2021-10-30 LAB — CBC
HCT: 40.7 % (ref 36.0–46.0)
Hemoglobin: 13.2 g/dL (ref 12.0–15.0)
MCH: 30.2 pg (ref 26.0–34.0)
MCHC: 32.4 g/dL (ref 30.0–36.0)
MCV: 93.1 fL (ref 80.0–100.0)
Platelets: 143 10*3/uL — ABNORMAL LOW (ref 150–400)
RBC: 4.37 MIL/uL (ref 3.87–5.11)
RDW: 15.1 % (ref 11.5–15.5)
WBC: 4.2 10*3/uL (ref 4.0–10.5)
nRBC: 0 % (ref 0.0–0.2)

## 2021-10-30 LAB — COMPREHENSIVE METABOLIC PANEL
ALT: 21 U/L (ref 0–44)
AST: 29 U/L (ref 15–41)
Albumin: 4.6 g/dL (ref 3.5–5.0)
Alkaline Phosphatase: 48 U/L (ref 38–126)
Anion gap: 10 (ref 5–15)
BUN: 11 mg/dL (ref 8–23)
CO2: 24 mmol/L (ref 22–32)
Calcium: 9 mg/dL (ref 8.9–10.3)
Chloride: 107 mmol/L (ref 98–111)
Creatinine, Ser: 0.81 mg/dL (ref 0.44–1.00)
GFR, Estimated: >60 mL/min (ref >60)
Glucose, Bld: 127 mg/dL — ABNORMAL HIGH (ref 70–99)
Potassium: 4.1 mmol/L (ref 3.5–5.1)
Sodium: 141 mmol/L (ref 135–145)
Total Bilirubin: 0.5 mg/dL (ref 0.3–1.2)
Total Protein: 7.3 g/dL (ref 6.5–8.1)

## 2021-10-30 SURGERY — ARTHROPLASTY, KNEE, TOTAL
Anesthesia: Spinal | Site: Knee | Laterality: Right

## 2021-10-30 MED ORDER — ONDANSETRON HCL 4 MG PO TABS: 4.0000 mg | ORAL_TABLET | Freq: Every day | ORAL | 1 refills | Status: DC | PRN

## 2021-10-30 MED ORDER — LACTATED RINGERS IV SOLN: INTRAVENOUS | Status: DC

## 2021-10-30 MED ORDER — ONDANSETRON HCL 4 MG/2ML IJ SOLN
INTRAMUSCULAR | Status: DC | PRN
  Administered 2021-10-30: 4 mg via INTRAVENOUS

## 2021-10-30 MED ORDER — SODIUM CHLORIDE 0.9 % IV SOLN: INTRAVENOUS | Status: DC

## 2021-10-30 MED ORDER — CEFAZOLIN SODIUM-DEXTROSE 2-4 GM/100ML-% IV SOLN
2.0000 g | INTRAVENOUS | Status: AC
  Administered 2021-10-30: 2 g via INTRAVENOUS
  Filled 2021-10-30: qty 100

## 2021-10-30 MED ORDER — PHENYLEPHRINE 80 MCG/ML (10ML) SYRINGE FOR IV PUSH (FOR BLOOD PRESSURE SUPPORT)
PREFILLED_SYRINGE | INTRAVENOUS | Status: AC
  Filled 2021-10-30: qty 10

## 2021-10-30 MED ORDER — HYDROMORPHONE HCL 2 MG PO TABS
2.0000 mg | ORAL_TABLET | ORAL | Status: DC | PRN
  Administered 2021-10-30 – 2021-10-31 (×2): 2 mg via ORAL

## 2021-10-30 MED ORDER — DIPHENHYDRAMINE HCL 12.5 MG/5ML PO ELIX: 12.5000 mg | ORAL_SOLUTION | ORAL | Status: DC | PRN

## 2021-10-30 MED ORDER — BUPIVACAINE LIPOSOME 1.3 % IJ SUSP
INTRAMUSCULAR | Status: DC | PRN
  Administered 2021-10-30: 20 mL

## 2021-10-30 MED ORDER — ROSUVASTATIN CALCIUM 20 MG PO TABS
20.0000 mg | ORAL_TABLET | Freq: Every day | ORAL | Status: DC
  Administered 2021-10-30: 20 mg via ORAL
  Filled 2021-10-30: qty 1

## 2021-10-30 MED ORDER — MECLIZINE HCL 25 MG PO TABS: 25.0000 mg | ORAL_TABLET | Freq: Three times a day (TID) | ORAL | Status: DC | PRN

## 2021-10-30 MED ORDER — TRANEXAMIC ACID-NACL 1000-0.7 MG/100ML-% IV SOLN
1000.0000 mg | INTRAVENOUS | Status: AC
  Administered 2021-10-30: 1000 mg via INTRAVENOUS
  Filled 2021-10-30: qty 100

## 2021-10-30 MED ORDER — EPHEDRINE SULFATE (PRESSORS) 50 MG/ML IJ SOLN
INTRAMUSCULAR | Status: DC | PRN
  Administered 2021-10-30: 5 mg via INTRAVENOUS

## 2021-10-30 MED ORDER — MIDAZOLAM HCL 2 MG/2ML IJ SOLN
INTRAMUSCULAR | Status: AC
  Filled 2021-10-30: qty 2

## 2021-10-30 MED ORDER — CHLORHEXIDINE GLUCONATE 0.12 % MT SOLN
15.0000 mL | Freq: Once | OROMUCOSAL | Status: AC
  Administered 2021-10-30: 15 mL via OROMUCOSAL

## 2021-10-30 MED ORDER — HYDROMORPHONE HCL 2 MG PO TABS
1.0000 mg | ORAL_TABLET | ORAL | Status: DC | PRN
  Administered 2021-10-30 – 2021-10-31 (×2): 2 mg via ORAL
  Filled 2021-10-30 (×4): qty 1

## 2021-10-30 MED ORDER — HYDROMORPHONE HCL 1 MG/ML IJ SOLN
0.5000 mg | INTRAMUSCULAR | Status: DC | PRN
  Administered 2021-10-31: 0.5 mg via INTRAVENOUS
  Filled 2021-10-30: qty 1

## 2021-10-30 MED ORDER — FAMOTIDINE 20 MG PO TABS
40.0000 mg | ORAL_TABLET | Freq: Every day | ORAL | Status: DC
  Filled 2021-10-30: qty 2

## 2021-10-30 MED ORDER — EPHEDRINE 5 MG/ML INJ
INTRAVENOUS | Status: AC
  Filled 2021-10-30: qty 5

## 2021-10-30 MED ORDER — SODIUM CHLORIDE (PF) 0.9 % IJ SOLN
INTRAMUSCULAR | Status: AC
  Filled 2021-10-30: qty 50

## 2021-10-30 MED ORDER — ROPINIROLE HCL 1 MG PO TABS
4.0000 mg | ORAL_TABLET | Freq: Every day | ORAL | Status: DC
  Administered 2021-10-30: 4 mg via ORAL
  Filled 2021-10-30: qty 4

## 2021-10-30 MED ORDER — ACETAMINOPHEN 500 MG PO TABS: 1000.0000 mg | ORAL_TABLET | Freq: Three times a day (TID) | ORAL | 2 refills | Status: DC | PRN

## 2021-10-30 MED ORDER — PROPOFOL 500 MG/50ML IV EMUL
INTRAVENOUS | Status: DC | PRN
  Administered 2021-10-30: 100 ug/kg/min via INTRAVENOUS

## 2021-10-30 MED ORDER — ROPIVACAINE HCL 5 MG/ML IJ SOLN
INTRAMUSCULAR | Status: DC | PRN
  Administered 2021-10-30: 20 mL via PERINEURAL

## 2021-10-30 MED ORDER — CEPHALEXIN 500 MG PO CAPS: 500.0000 mg | ORAL_CAPSULE | Freq: Four times a day (QID) | ORAL | 0 refills | Status: AC

## 2021-10-30 MED ORDER — DEXAMETHASONE SODIUM PHOSPHATE 10 MG/ML IJ SOLN
8.0000 mg | Freq: Once | INTRAMUSCULAR | Status: AC
  Administered 2021-10-30: 8 mg via INTRAVENOUS

## 2021-10-30 MED ORDER — LACTATED RINGERS IV SOLN
INTRAVENOUS | Status: DC
Start: 2021-10-30 — End: 2021-10-30

## 2021-10-30 MED ORDER — CLOPIDOGREL BISULFATE 75 MG PO TABS
75.0000 mg | ORAL_TABLET | Freq: Every day | ORAL | Status: DC
  Administered 2021-10-31: 75 mg via ORAL
  Filled 2021-10-30: qty 1

## 2021-10-30 MED ORDER — FENTANYL CITRATE PF 50 MCG/ML IJ SOSY
PREFILLED_SYRINGE | INTRAMUSCULAR | Status: AC
  Administered 2021-10-30: 50 ug via INTRAVENOUS
  Filled 2021-10-30: qty 2

## 2021-10-30 MED ORDER — SODIUM CHLORIDE (PF) 0.9 % IJ SOLN
INTRAMUSCULAR | Status: AC
  Filled 2021-10-30: qty 10

## 2021-10-30 MED ORDER — DEXAMETHASONE SODIUM PHOSPHATE 10 MG/ML IJ SOLN
INTRAMUSCULAR | Status: AC
  Filled 2021-10-30: qty 1

## 2021-10-30 MED ORDER — PANTOPRAZOLE SODIUM 40 MG PO TBEC
40.0000 mg | DELAYED_RELEASE_TABLET | Freq: Two times a day (BID) | ORAL | Status: DC
  Administered 2021-10-30 – 2021-10-31 (×2): 40 mg via ORAL
  Filled 2021-10-30 (×2): qty 1

## 2021-10-30 MED ORDER — SODIUM CHLORIDE 0.9 % IR SOLN
Status: DC | PRN
  Administered 2021-10-30: 1000 mL

## 2021-10-30 MED ORDER — ONDANSETRON HCL 4 MG/2ML IJ SOLN: 4.0000 mg | Freq: Four times a day (QID) | INTRAMUSCULAR | Status: DC | PRN

## 2021-10-30 MED ORDER — HYDROMORPHONE HCL 2 MG PO TABS: 2.0000 mg | ORAL_TABLET | ORAL | 0 refills | Status: AC | PRN

## 2021-10-30 MED ORDER — CEFAZOLIN SODIUM-DEXTROSE 1-4 GM/50ML-% IV SOLN
1.0000 g | Freq: Four times a day (QID) | INTRAVENOUS | Status: AC
  Administered 2021-10-30 – 2021-10-31 (×3): 1 g via INTRAVENOUS
  Filled 2021-10-30 (×3): qty 50

## 2021-10-30 MED ORDER — PROPOFOL 1000 MG/100ML IV EMUL
INTRAVENOUS | Status: AC
  Filled 2021-10-30: qty 100

## 2021-10-30 MED ORDER — FENTANYL CITRATE (PF) 100 MCG/2ML IJ SOLN
INTRAMUSCULAR | Status: AC
  Filled 2021-10-30: qty 2

## 2021-10-30 MED ORDER — ACETAMINOPHEN 500 MG PO TABS
1000.0000 mg | ORAL_TABLET | Freq: Four times a day (QID) | ORAL | Status: AC
  Administered 2021-10-30 – 2021-10-31 (×4): 1000 mg via ORAL
  Filled 2021-10-30 (×4): qty 2

## 2021-10-30 MED ORDER — PHENYLEPHRINE HCL (PRESSORS) 10 MG/ML IV SOLN
INTRAVENOUS | Status: DC | PRN
  Administered 2021-10-30 (×4): 160 ug via INTRAVENOUS

## 2021-10-30 MED ORDER — ONDANSETRON HCL 4 MG PO TABS: 4.0000 mg | ORAL_TABLET | Freq: Four times a day (QID) | ORAL | Status: DC | PRN

## 2021-10-30 MED ORDER — 0.9 % SODIUM CHLORIDE (POUR BTL) OPTIME
TOPICAL | Status: DC | PRN
  Administered 2021-10-30: 1000 mL

## 2021-10-30 MED ORDER — MAGNESIUM CITRATE PO SOLN: 1.0000 | Freq: Once | ORAL | Status: DC | PRN

## 2021-10-30 MED ORDER — ORAL CARE MOUTH RINSE: 15.0000 mL | Freq: Once | OROMUCOSAL | Status: AC

## 2021-10-30 MED ORDER — BISACODYL 5 MG PO TBEC: 5.0000 mg | DELAYED_RELEASE_TABLET | Freq: Every day | ORAL | Status: DC | PRN

## 2021-10-30 MED ORDER — FENTANYL CITRATE (PF) 100 MCG/2ML IJ SOLN
INTRAMUSCULAR | Status: DC | PRN
Start: 2021-10-30 — End: 2021-10-30
  Administered 2021-10-30 (×2): 50 ug via INTRAVENOUS

## 2021-10-30 MED ORDER — ONDANSETRON HCL 4 MG/2ML IJ SOLN
INTRAMUSCULAR | Status: AC
  Filled 2021-10-30: qty 2

## 2021-10-30 MED ORDER — LEVOTHYROXINE SODIUM 88 MCG PO TABS
88.0000 ug | ORAL_TABLET | Freq: Every day | ORAL | Status: DC
  Administered 2021-10-31: 88 ug via ORAL
  Filled 2021-10-30: qty 1

## 2021-10-30 MED ORDER — METHOCARBAMOL 500 MG IVPB - SIMPLE MED: 500.0000 mg | Freq: Four times a day (QID) | INTRAVENOUS | Status: DC | PRN

## 2021-10-30 MED ORDER — GABAPENTIN 300 MG PO CAPS
300.0000 mg | ORAL_CAPSULE | Freq: Every day | ORAL | Status: DC
  Administered 2021-10-30: 300 mg via ORAL
  Filled 2021-10-30: qty 1

## 2021-10-30 MED ORDER — ACETAMINOPHEN 325 MG PO TABS: 325.0000 mg | ORAL_TABLET | Freq: Four times a day (QID) | ORAL | Status: DC | PRN

## 2021-10-30 MED ORDER — LORAZEPAM 1 MG PO TABS: 1.0000 mg | ORAL_TABLET | Freq: Three times a day (TID) | ORAL | Status: DC | PRN

## 2021-10-30 MED ORDER — SODIUM CHLORIDE (PF) 0.9 % IJ SOLN
INTRAMUSCULAR | Status: DC | PRN
  Administered 2021-10-30: 60 mL

## 2021-10-30 MED ORDER — SENNOSIDES-DOCUSATE SODIUM 8.6-50 MG PO TABS: 1.0000 | ORAL_TABLET | Freq: Every evening | ORAL | Status: DC | PRN

## 2021-10-30 MED ORDER — ACETAMINOPHEN 10 MG/ML IV SOLN: 1000.0000 mg | Freq: Once | INTRAVENOUS | Status: DC | PRN

## 2021-10-30 MED ORDER — BUPIVACAINE LIPOSOME 1.3 % IJ SUSP
INTRAMUSCULAR | Status: AC
Start: 2021-10-30 — End: ?
  Filled 2021-10-30: qty 20

## 2021-10-30 MED ORDER — METHOCARBAMOL 500 MG PO TABS
500.0000 mg | ORAL_TABLET | Freq: Four times a day (QID) | ORAL | Status: DC | PRN
  Administered 2021-10-30 – 2021-10-31 (×3): 500 mg via ORAL
  Filled 2021-10-30 (×3): qty 1

## 2021-10-30 MED ORDER — MIDAZOLAM HCL 2 MG/2ML IJ SOLN: 1.0000 mg | INTRAMUSCULAR | Status: DC

## 2021-10-30 MED ORDER — ONDANSETRON HCL 4 MG/2ML IJ SOLN: 4.0000 mg | Freq: Once | INTRAMUSCULAR | Status: DC | PRN

## 2021-10-30 MED ORDER — POVIDONE-IODINE 10 % EX SWAB
2.0000 | Freq: Once | CUTANEOUS | Status: AC
  Administered 2021-10-30: 2 via TOPICAL

## 2021-10-30 MED ORDER — BUPIVACAINE LIPOSOME 1.3 % IJ SUSP: 20.0000 mL | Freq: Once | INTRAMUSCULAR | Status: DC

## 2021-10-30 MED ORDER — HYDROMORPHONE HCL 1 MG/ML IJ SOLN: 0.2500 mg | INTRAMUSCULAR | Status: DC | PRN

## 2021-10-30 MED ORDER — FENTANYL CITRATE PF 50 MCG/ML IJ SOSY: 50.0000 ug | PREFILLED_SYRINGE | INTRAMUSCULAR | Status: DC

## 2021-10-30 SURGICAL SUPPLY — 54 items
ATTUNE MED DOME PAT 32 KNEE (Knees) ×1 IMPLANT
ATTUNE PSFEM RTSZ5 NARCEM KNEE (Femur) ×1 IMPLANT
ATTUNE PSRP INSR SZ5 8 KNEE (Insert) ×1 IMPLANT
BAG COUNTER SPONGE SURGICOUNT (BAG) ×2 IMPLANT
BAG ZIPLOCK 12X15 (MISCELLANEOUS) ×2 IMPLANT
BASE TIBIAL ROT PLAT SZ 5 KNEE (Knees) IMPLANT
BLADE SAG 18X100X1.27 (BLADE) ×2 IMPLANT
BLADE SAW SGTL 11.0X1.19X90.0M (BLADE) ×2 IMPLANT
BNDG ELASTIC 4X5.8 VLCR STR LF (GAUZE/BANDAGES/DRESSINGS) ×2 IMPLANT
BNDG ELASTIC 6X5.8 VLCR STR LF (GAUZE/BANDAGES/DRESSINGS) ×2 IMPLANT
BOWL SMART MIX CTS (DISPOSABLE) ×2 IMPLANT
CEMENT HV SMART SET (Cement) ×2 IMPLANT
COVER SURGICAL LIGHT HANDLE (MISCELLANEOUS) ×2 IMPLANT
CUFF TOURN SGL QUICK 34 (TOURNIQUET CUFF) ×2
CUFF TRNQT CYL 34X4.125X (TOURNIQUET CUFF) ×1 IMPLANT
DERMABOND ADVANCED (GAUZE/BANDAGES/DRESSINGS) ×1
DERMABOND ADVANCED .7 DNX12 (GAUZE/BANDAGES/DRESSINGS) ×1 IMPLANT
DRAPE INCISE IOBAN 66X45 STRL (DRAPES) ×2 IMPLANT
DRAPE U-SHAPE 47X51 STRL (DRAPES) ×2 IMPLANT
DRSG AQUACEL AG ADV 3.5X10 (GAUZE/BANDAGES/DRESSINGS) ×2 IMPLANT
DURAPREP 26ML APPLICATOR (WOUND CARE) ×4 IMPLANT
ELECT REM PT RETURN 15FT ADLT (MISCELLANEOUS) ×2 IMPLANT
GLOVE BIOGEL PI IND STRL 7.5 (GLOVE) ×1 IMPLANT
GLOVE BIOGEL PI IND STRL 8 (GLOVE) ×1 IMPLANT
GLOVE BIOGEL PI INDICATOR 7.5 (GLOVE) ×1
GLOVE BIOGEL PI INDICATOR 8 (GLOVE) ×1
GLOVE ECLIPSE 8.0 STRL XLNG CF (GLOVE) ×2 IMPLANT
GLOVE SURG ORTHO 9.0 STRL STRW (GLOVE) ×2 IMPLANT
GLOVE SURG SS PI 7.0 STRL IVOR (GLOVE) ×2 IMPLANT
GOWN SPEC L4 XLG W/TWL (GOWN DISPOSABLE) ×4 IMPLANT
HANDPIECE INTERPULSE COAX TIP (DISPOSABLE) ×2
KIT TURNOVER KIT A (KITS) ×1 IMPLANT
NS IRRIG 1000ML POUR BTL (IV SOLUTION) ×2 IMPLANT
PACK ICE MAXI GEL EZY WRAP (MISCELLANEOUS) ×1 IMPLANT
PACK TOTAL KNEE CUSTOM (KITS) ×2 IMPLANT
PROTECTOR NERVE ULNAR (MISCELLANEOUS) ×2 IMPLANT
SET HNDPC FAN SPRY TIP SCT (DISPOSABLE) ×1 IMPLANT
SET PAD KNEE POSITIONER (MISCELLANEOUS) ×2 IMPLANT
SOLUTION PRONTOSAN WOUND 350ML (IRRIGATION / IRRIGATOR) ×1 IMPLANT
SPIKE FLUID TRANSFER (MISCELLANEOUS) ×2 IMPLANT
SPONGE SURGIFOAM ABS GEL 100 (HEMOSTASIS) ×2 IMPLANT
STOCKINETTE 6  STRL (DRAPES) ×2
STOCKINETTE 6 STRL (DRAPES) ×1 IMPLANT
SUT MNCRL AB 3-0 PS2 18 (SUTURE) ×2 IMPLANT
SUT VIC AB 1 CT1 27 (SUTURE) ×6
SUT VIC AB 1 CT1 27XBRD ANTBC (SUTURE) ×3 IMPLANT
SUT VIC AB 2-0 CT1 27 (SUTURE) ×4
SUT VIC AB 2-0 CT1 TAPERPNT 27 (SUTURE) ×2 IMPLANT
SUT VLOC 180 0 24IN GS25 (SUTURE) ×2 IMPLANT
SYR 50ML LL SCALE MARK (SYRINGE) ×2 IMPLANT
TAPE STRIPS DRAPE STRL (GAUZE/BANDAGES/DRESSINGS) ×2 IMPLANT
TIBIAL BASE ROT PLAT SZ 5 KNEE (Knees) ×2 IMPLANT
TRAY CATH INTERMITTENT SS 16FR (CATHETERS) ×2 IMPLANT
WATER STERILE IRR 1000ML POUR (IV SOLUTION) ×4 IMPLANT

## 2021-10-30 NOTE — Interval H&P Note (Cosign Needed)
History and Physical Interval Note:  10/30/2021 11:01 AM  Jade Mooney  has presented today for surgery, with the diagnosis of Right knee osteoarthritis.  The various methods of treatment have been discussed with the patient and family. After consideration of risks, benefits and other options for treatment, the patient has consented to  Procedure(s) with comments: TOTAL KNEE ARTHROPLASTY (Right) - adductor canal 120 as a surgical intervention.  The patient's history has been reviewed, patient examined, no change in status, stable for surgery.  I have reviewed the patient's chart and labs.  Questions were answered to the patient's satisfaction.     Sharlynn Seckinger ANDREW

## 2021-10-30 NOTE — Anesthesia Preprocedure Evaluation (Signed)
Anesthesia Evaluation  Patient identified by MRN, date of birth, ID band Patient awake    Reviewed: Allergy & Precautions, NPO status , Patient's Chart, lab work & pertinent test results  Airway Mallampati: II  TM Distance: >3 FB Neck ROM: Full    Dental no notable dental hx.    Pulmonary neg pulmonary ROS,    Pulmonary exam normal breath sounds clear to auscultation       Cardiovascular hypertension, Normal cardiovascular exam Rhythm:Regular Rate:Normal     Neuro/Psych TIAnegative psych ROS   GI/Hepatic negative GI ROS, Neg liver ROS,   Endo/Other  Hypothyroidism   Renal/GU negative Renal ROS  negative genitourinary   Musculoskeletal  (+) Arthritis , Osteoarthritis,    Abdominal   Peds negative pediatric ROS (+)  Hematology negative hematology ROS (+)   Anesthesia Other Findings   Reproductive/Obstetrics negative OB ROS                             Anesthesia Physical Anesthesia Plan  ASA: 3  Anesthesia Plan: Spinal   Post-op Pain Management: Regional block*   Induction: Intravenous  PONV Risk Score and Plan: 2 and Ondansetron, Propofol infusion and Treatment may vary due to age or medical condition  Airway Management Planned: Simple Face Mask  Additional Equipment:   Intra-op Plan:   Post-operative Plan:   Informed Consent: I have reviewed the patients History and Physical, chart, labs and discussed the procedure including the risks, benefits and alternatives for the proposed anesthesia with the patient or authorized representative who has indicated his/her understanding and acceptance.     Dental advisory given  Plan Discussed with: CRNA and Surgeon  Anesthesia Plan Comments:         Anesthesia Quick Evaluation

## 2021-10-30 NOTE — Care Plan (Signed)
Ortho Bundle Case Management Note  Patient Details  Name: Jade Mooney MRN: [id number] Date of Birth: 03/27/1945  R TKA on 10-30-21 DCP:  Home with husband DME:  No needs, has a RW PT:  [place] on 11-04-21.                   DME Arranged:  N/A DME Agency:  NA  HH Arranged:  NA HH Agency:  NA  Additional Comments: Please contact me with any questions of if this plan should need to change.  Marianne Sofia, RN,CCM [facility]  [phone] 10/30/2021, 9:49 AM

## 2021-10-30 NOTE — Plan of Care (Signed)
  Problem: Activity: Goal: Risk for activity intolerance will decrease Outcome: Progressing   Problem: Pain Managment: Goal: General experience of comfort will improve Outcome: Progressing   Problem: Safety: Goal: Ability to remain free from injury will improve Outcome: Progressing   

## 2021-10-30 NOTE — Evaluation (Signed)
Physical Therapy Evaluation Patient Details Name: Jade Mooney MRN: [id number] DOB: 1945-06-17 Today's Date: 10/30/2021  History of Present Illness  Pt is a 76yo female presenting s/p R-TKA on 10/30/21. PMH: hx of breast cancer s/p mastectomy/chemo/radiation, GERD, HTN, hypothyroidism, hx of TIA, L-THA 2017, cervical fusion, L-TKA.   Clinical Impression  Jade Mooney is a 76 y.o. female POD 0 s/p R-TKA. Patient reports independence with mobility at baseline. Patient is now limited by functional impairments (see PT problem list below) and requires supervision for bed mobility and min guard for transfers. Patient was able to ambulate 25 feet with RW and min guard level of assist. Patient instructed in exercise to facilitate ROM and circulation to manage edema. Provided incentive spirometer and with Vcs pt able to achieve 1565m. Patient will benefit from continued skilled PT interventions to address impairments and progress towards PLOF. Acute PT will follow to progress mobility and stair training in preparation for safe discharge home.       Recommendations for follow up therapy are one component of a multi-disciplinary discharge planning process, led by the attending physician.  Recommendations may be updated based on patient status, additional functional criteria and insurance authorization.  Follow Up Recommendations Follow physician's recommendations for discharge plan and follow up therapies      Assistance Recommended at Discharge Set up Supervision/Assistance  Patient can return home with the following  A little help with walking and/or transfers;A little help with bathing/dressing/bathroom;Assistance with cooking/housework;Assist for transportation;Help with stairs or ramp for entrance    Equipment Recommendations None recommended by PT  Recommendations for Other Services       Functional Status Assessment Patient has had a recent decline in their functional status  and demonstrates the ability to make significant improvements in function in a reasonable and predictable amount of time.     Precautions / Restrictions Precautions Precautions: Fall Restrictions Weight Bearing Restrictions: No Other Position/Activity Restrictions: WBAT      Mobility  Bed Mobility Overal bed mobility: Needs Assistance Bed Mobility: Supine to Sit     Supine to sit: Supervision     General bed mobility comments: for safety only    Transfers Overall transfer level: Needs assistance Equipment used: Rolling walker (2 wheels) Transfers: Sit to/from Stand Sit to Stand: Min guard           General transfer comment: for safety only, VCs for using BUE to power up. Lateral weight shifts in stance to demonstrate WBAT status.    Ambulation/Gait Ambulation/Gait assistance: Min guard Gait Distance (Feet): 25 Feet Assistive device: Rolling walker (2 wheels) Gait Pattern/deviations: Step-to pattern Gait velocity: decreased     General Gait Details: Pt ambulated with RW and min guard, no physical assist required or overt LOB noted. At end of ambulation task, pt reporting slight dizzines but after ~343m sitting symptoms cessated.  Stairs            Wheelchair Mobility    Modified Rankin (Stroke Patients Only)       Balance Overall balance assessment: Needs assistance Sitting-balance support: Feet supported, No upper extremity supported Sitting balance-Leahy Scale: Good     Standing balance support: Reliant on assistive device for balance, During functional activity, Bilateral upper extremity supported Standing balance-Leahy Scale: Poor                               Pertinent Vitals/Pain Pain Assessment Pain Assessment: 0-10 Pain Score:  3  Pain Location: right knee Pain Descriptors / Indicators: Operative site guarding Pain Intervention(s): Limited activity within patient's tolerance, Monitored during session, Repositioned, Ice  applied    Home Living Family/patient expects to be discharged to:: Private residence Living Arrangements: Spouse/significant other Available Help at Discharge: Family;Available 24 hours/day Type of Home: House Home Access: Stairs to enter Entrance Stairs-Rails: None (grab bar on garage stairs) Entrance Stairs-Number of Steps: 3   Home Layout: Able to live on main level with bedroom/bathroom;Full bath on main level Home Equipment: BSC/3in1;Grab bars - toilet;Grab bars - tub/shower;Rolling Walker (2 wheels);Cane - single point      Prior Function Prior Level of Function : Independent/Modified Independent             Mobility Comments: ind ADLs Comments: ind     Hand Dominance   Dominant Hand: Right    Extremity/Trunk Assessment   Upper Extremity Assessment Upper Extremity Assessment: Overall WFL for tasks assessed    Lower Extremity Assessment Lower Extremity Assessment: RLE deficits/detail;LLE deficits/detail RLE Deficits / Details: MMT ank DF/PF 5/5, no extensor lag noted RLE Sensation: WNL LLE Deficits / Details: MMT ank DF/PF 5/5 LLE Sensation: WNL    Cervical / Trunk Assessment Cervical / Trunk Assessment: Neck Surgery  Communication   Communication: No difficulties  Cognition Arousal/Alertness: Awake/alert Behavior During Therapy: WFL for tasks assessed/performed Overall Cognitive Status: Within Functional Limits for tasks assessed                                          General Comments      Exercises Total Joint Exercises Ankle Circles/Pumps: AROM, Both, 10 reps   Assessment/Plan    PT Assessment Patient needs continued PT services  PT Problem List Decreased strength;Decreased range of motion;Decreased activity tolerance;Decreased balance;Decreased mobility;Decreased coordination;Pain       PT Treatment Interventions DME instruction;Gait training;Stair training;Functional mobility training;Therapeutic activities;Therapeutic  exercise;Balance training;Neuromuscular re-education;Patient/family education    PT Goals (Current goals can be found in the Care Plan section)  Acute Rehab PT Goals Patient Stated Goal: Improve balance PT Goal Formulation: With patient Time For Goal Achievement: 11/06/21 Potential to Achieve Goals: Good    Frequency 7X/week     Co-evaluation               AM-PAC PT "6 Clicks" Mobility  Outcome Measure Help needed turning from your back to your side while in a flat bed without using bedrails?: None Help needed moving from lying on your back to sitting on the side of a flat bed without using bedrails?: A Little Help needed moving to and from a bed to a chair (including a wheelchair)?: A Little Help needed standing up from a chair using your arms (e.g., wheelchair or bedside chair)?: A Little Help needed to walk in hospital room?: A Little Help needed climbing 3-5 steps with a railing? : A Little 6 Click Score: 19    End of Session Equipment Utilized During Treatment: Gait belt Activity Tolerance: Patient tolerated treatment well;No increased pain Patient left: in chair;with call bell/phone within reach;with chair alarm set;with SCD's reapplied Nurse Communication: Mobility status PT Visit Diagnosis: Pain;Difficulty in walking, not elsewhere classified (R26.2) Pain - Right/Left: Left Pain - part of body: Knee    Time: 1750-1820 PT Time Calculation (min) (ACUTE ONLY): 30 min   Charges:   PT Evaluation $PT Eval Low Complexity: 1  Low PT Treatments $Gait Training: 8-22 mins        Coolidge Breeze, PT, DPT [facility] Department Office: [phone] Pager: [phone]  Coolidge Breeze 10/30/2021, 6:58 PM

## 2021-10-30 NOTE — Anesthesia Procedure Notes (Signed)
Anesthesia Regional Block: Adductor canal block   Pre-Anesthetic Checklist: , timeout performed,  Correct Patient, Correct Site, Correct Laterality,  Correct Procedure, Correct Position, site marked,  Risks and benefits discussed,  Surgical consent,  Pre-op evaluation,  At surgeon's request and post-op pain management  Laterality: Right  Prep: chloraprep       Needles:  Injection technique: Single-shot  Needle Type: Echogenic Needle     Needle Length: 9cm      Additional Needles:   Procedures:,,,, ultrasound used (permanent image in chart),,    Narrative:  Start time: 10/30/2021 11:10 AM End time: 10/30/2021 11:16 AM Injection made incrementally with aspirations every 5 mL.  Performed by: Personally  Anesthesiologist: Myrtie Soman, MD  Additional Notes: Patient tolerated the procedure well without complications

## 2021-10-30 NOTE — Anesthesia Procedure Notes (Signed)
Anesthesia Procedure Image    

## 2021-10-30 NOTE — Transfer of Care (Signed)
Immediate Anesthesia Transfer of Care Note  Patient: Jade Mooney  Procedure(s) Performed: TOTAL KNEE ARTHROPLASTY (Right: Knee)  Patient Location: PACU  Anesthesia Type:Spinal  Level of Consciousness: awake, alert , oriented and patient cooperative  Airway & Oxygen Therapy: Patient Spontanous Breathing and Patient connected to face mask oxygen  Post-op Assessment: Report given to RN and Post -op Vital signs reviewed and stable  Post vital signs: Reviewed and stable  Last Vitals:  Vitals Value Taken Time  BP 124/104 10/30/21 1356  Temp    Pulse 86 10/30/21 1359  Resp 17 10/30/21 1359  SpO2 95 % 10/30/21 1359  Vitals shown include unvalidated device data.  Last Pain:  Vitals:   10/30/21 0918  PainSc: 0-No pain      Patients Stated Pain Goal: 4 (76/80/88 1103)  Complications: No notable events documented.

## 2021-10-30 NOTE — Anesthesia Postprocedure Evaluation (Signed)
Anesthesia Post Note  Patient: Jade Mooney  Procedure(s) Performed: TOTAL KNEE ARTHROPLASTY (Right: Knee)     Patient location during evaluation: PACU Anesthesia Type: Spinal Level of consciousness: oriented and awake and alert Pain management: pain level controlled Vital Signs Assessment: post-procedure vital signs reviewed and stable Respiratory status: spontaneous breathing, respiratory function stable and patient connected to nasal cannula oxygen Cardiovascular status: blood pressure returned to baseline and stable Postop Assessment: no headache, no backache and no apparent nausea or vomiting Anesthetic complications: no   No notable events documented.  Last Vitals:  Vitals:   10/30/21 1500 10/30/21 1522  BP: (!) 123/99 (!) 169/87  Pulse: 62 82  Resp: (!) 9   Temp: 36.7 C 36.6 C  SpO2: 100% 95%    Last Pain:  Vitals:   10/30/21 1522  TempSrc: Oral  PainSc:     LLE Motor Response: Purposeful movement (10/30/21 1534) LLE Sensation: Numbness;Decreased (10/30/21 1534) RLE Motor Response: Purposeful movement (10/30/21 1534) RLE Sensation: Decreased;Numbness (10/30/21 1534)      Behr Cislo S

## 2021-10-30 NOTE — Interval H&P Note (Signed)
History and Physical Interval Note:  10/30/2021 11:00 AM  [facility]  has presented today for surgery, with the diagnosis of Right knee osteoarthritis.  The various methods of treatment have been discussed with the patient and family. After consideration of risks, benefits and other options for treatment, the patient has consented to  Procedure(s) with comments: TOTAL KNEE ARTHROPLASTY (Right) - adductor canal 120 as a surgical intervention.  The patient's history has been reviewed, patient examined, no change in status, stable for surgery.  I have reviewed the patient's chart and labs.  Questions were answered to the patient's satisfaction.     Jade Mooney

## 2021-10-30 NOTE — Op Note (Signed)
DATE OF SURGERY:  10/30/2021  TIME: 1:28 PM  PATIENT NAME:  Jade Mooney    AGE: 76 y.o.   PRE-OPERATIVE DIAGNOSIS:  Right knee osteoarthritis  POST-OPERATIVE DIAGNOSIS:  Right knee osteoarthritis  PROCEDURE:  Procedure(s): TOTAL KNEE ARTHROPLASTY  SURGEON:  Torrence Hammack ANDREW  ASSISTANT:  Leeanne Haus, PA-C, present and scrubbed throughout the case, critical for assistance with exposure, retraction, instrumentation, and closure.  OPERATIVE IMPLANTS: Depuy PFC Attune Rotating Platform.  Femur size 5, Tibia size 5, Patella size 32 3-peg oval button, with a 8 mm polyethylene insert.   PREOPERATIVE INDICATIONS:   Jade Mooney is a 75 y.o. year old female with end stage bone on bone arthritis of the knee who failed conservative treatment and elected for Total Knee Arthroplasty.   The risks, benefits, and alternatives were discussed at length including but not limited to the risks of infection, bleeding, nerve injury, stiffness, blood clots, the need for revision surgery, cardiopulmonary complications, among others, and they were willing to proceed.  OPERATIVE DESCRIPTION:  The patient was brought to the operative room and placed in a supine position.  Spinal anesthesia was administered.  IV antibiotics were given.  The lower extremity was prepped and draped in the usual sterile fashion.  Time out was performed.  The leg was elevated and exsanguinated and the tourniquet was inflated.  Anterior quadriceps tendon splitting approach was performed.  The patella was retracted and osteophytes were removed.  The anterior horn of the medial and lateral meniscus was removed and cruciate ligaments resected.   The distal femur was opened with the drill and the intramedullary distal femoral cutting jig was utilized, set at 5 degrees resecting 10 mm off the distal femur.  Care was taken to protect the collateral ligaments.  The distal femoral sizing jig was applied, taking  care to avoid notching.  Then the 4-in-1 cutting jig was applied and the anterior and posterior femur was cut, along with the chamfer cuts.    Then the extramedullary tibial cutting jig was utilized making the appropriate cut using the anterior tibial crest as a reference building in appropriate posterior slope.  Care was taken during the cut to protect the medial and collateral ligaments.  The proximal tibia was removed along with the posterior horns of the menisci.   The posterior medial femoral osteophytes and posterior lateral femoral osteophytes were removed.    The flexion gap was then measured and was symmetric with the extension gap, measured at 8.  I completed the distal femoral preparation using the appropriate jig to prepare the box.  The patella was then measured, and cut with the saw.    The proximal tibia sized and prepared accordingly with the reamer and the punch, and then all components were trialed with the trial insert.  The knee was found to have excellent balance and full motion.    The above named components were then cemented into place and all excess cement was removed.  The trial polyethylene component was in place during cementation, and then was exchanged for the real polyethylene component.    The knee was easily taken through a range of motion and the patella tracked well and the knee irrigated copiously and the parapatellar and subcutaneous tissue closed with vicryl, and monocryl with steri strips for the skin.  The arthrotomy was closed at 90 of flexion. The wounds were dressed with sterile gauze and the tourniquet released and the patient was awakened and returned to the PACU in  stable and satisfactory condition.  There were no complications.  Total tourniquet time was 78 minutes.

## 2021-10-30 NOTE — Anesthesia Procedure Notes (Signed)
Spinal  Patient location during procedure: OR Start time: 10/30/2021 11:55 AM End time: 10/30/2021 12:05 PM Reason for block: surgical anesthesia Staffing Performed: resident/CRNA  Anesthesiologist: Myrtie Soman, MD Resident/CRNA: Jonna Munro, CRNA Performed by: Jonna Munro, CRNA Authorized by: Myrtie Soman, MD   Preanesthetic Checklist Completed: patient identified, IV checked, site marked, risks and benefits discussed, surgical consent, monitors and equipment checked, pre-op evaluation and timeout performed Spinal Block Patient position: sitting Prep: DuraPrep Patient monitoring: cardiac monitor, continuous pulse ox and blood pressure Approach: midline Location: L4-5 Injection technique: single-shot Needle Needle type: Pencan  Needle gauge: 24 G Needle length: 10 cm Assessment Sensory level: T6

## 2021-10-31 ENCOUNTER — Encounter (HOSPITAL_COMMUNITY): Payer: Self-pay | Admitting: Specialist

## 2021-10-31 DIAGNOSIS — I10 Essential (primary) hypertension: Secondary | ICD-10-CM | POA: Diagnosis not present

## 2021-10-31 DIAGNOSIS — M1711 Unilateral primary osteoarthritis, right knee: Secondary | ICD-10-CM | POA: Diagnosis not present

## 2021-10-31 DIAGNOSIS — Z853 Personal history of malignant neoplasm of breast: Secondary | ICD-10-CM | POA: Diagnosis not present

## 2021-10-31 DIAGNOSIS — E039 Hypothyroidism, unspecified: Secondary | ICD-10-CM | POA: Diagnosis not present

## 2021-10-31 DIAGNOSIS — Z96652 Presence of left artificial knee joint: Secondary | ICD-10-CM | POA: Diagnosis not present

## 2021-10-31 DIAGNOSIS — Z8673 Personal history of transient ischemic attack (TIA), and cerebral infarction without residual deficits: Secondary | ICD-10-CM | POA: Diagnosis not present

## 2021-10-31 MED ORDER — METHOCARBAMOL 500 MG PO TABS: 500.0000 mg | ORAL_TABLET | Freq: Four times a day (QID) | ORAL | 0 refills | Status: DC | PRN

## 2021-10-31 MED ORDER — GABAPENTIN 300 MG PO CAPS: 300.0000 mg | ORAL_CAPSULE | Freq: Every day | ORAL | 0 refills | Status: DC

## 2021-10-31 NOTE — Progress Notes (Signed)
Subjective: 1 Day Post-Op Procedure(s) (LRB): TOTAL KNEE ARTHROPLASTY (Right) Patient reports pain as mild.  Pain is well-controlled on pain medication Unfortunately did have a dip in her oxygen overnight, went down to 89% was placed on 2 L of O2 via nasal cannula and brought oxygen back up to 90% Catheter was removed this morning Patient was able to get up with physical therapy yesterday and ambulate in the hallway  Objective: Vital signs in last 24 hours: Temp:  [97.6 F (36.4 C)-98.6 F (37 C)] 97.9 F (36.6 C) (08/10 0554) Pulse Rate:  [59-88] 59 (08/10 0554) Resp:  [9-26] 17 (08/10 0554) BP: (95-193)/(60-104) 107/62 (08/10 0554) SpO2:  [93 %-100 %] 98 % (08/10 0554) Weight:  [80.3 kg] 80.3 kg (08/09 0918)  Intake/Output from previous day: 08/09 0701 - 08/10 0700 In: 2510 [P.O.:360; I.V.:1900; IV Piggyback:250] Out: 2825 [Urine:2800; Blood:25] Intake/Output this shift: Total I/O In: 810 [P.O.:360; I.V.:400; IV Piggyback:50] Out: 700 [Urine:700]  Recent Labs    10/30/21 1531  HGB 13.2   Recent Labs    10/30/21 1531  WBC 4.2  RBC 4.37  HCT 40.7  PLT 143*   Recent Labs    10/30/21 1531  NA 141  K 4.1  CL 107  CO2 24  BUN 11  CREATININE 0.81  GLUCOSE 127*  CALCIUM 9.0   No results for input(s): "LABPT", "INR" in the last 72 hours.  Neurologically intact Neurovascular intact Sensation intact distally Intact pulses distally Dorsiflexion/Plantar flexion intact Incision: dressing C/D/I Compartment soft   Assessment/Plan: 1 Day Post-Op Procedure(s) (LRB): TOTAL KNEE ARTHROPLASTY (Right) Advance diet Up with therapy Medications of already been sent to the pharmacy Hopeful for discharge today Will resume Plavix for DVT prevention Will follow-up in 2 weeks in the office for first postop appointment Has outpatient physical therapy already scheduled for next week    Patient's anticipated LOS is less than 2 midnights, meeting these requirements: -  Younger than 91 - Lives within 1 hour of care - Has a competent adult at home to recover with post-op recover - NO history of  - Chronic pain requiring opiods  - Diabetes  - Coronary Artery Disease  - Heart failure  - Heart attack  - Stroke  - DVT/VTE  - Cardiac arrhythmia  - Respiratory Failure/COPD  - Renal failure  - Anemia  - Advanced Liver disease     Derrick Ravel [phone] 10/31/2021, 6:27 AM

## 2021-10-31 NOTE — Progress Notes (Signed)
Physical Therapy Treatment Patient Details Name: Jade Mooney MRN: [id number] DOB: [birth year] Today's Date: 10/31/2021   History of Present Illness Pt is a 76yo female presenting s/p R-TKA on 10/30/21. PMH: hx of breast cancer s/p mastectomy/chemo/radiation, GERD, HTN, hypothyroidism, hx of TIA, L-THA 2017, cervical fusion, L-TKA.    PT Comments    Pt ambulated 120' with RW, no loss of balance. Initiated TKA HEP, she demonstrates good understanding. Will plan to do stair training this afternoon, then pt is expected to be ready to DC home from a PT standpoint.    Recommendations for follow up therapy are one component of a multi-disciplinary discharge planning process, led by the attending physician.  Recommendations may be updated based on patient status, additional functional criteria and insurance authorization.  Follow Up Recommendations  Follow physician's recommendations for discharge plan and follow up therapies     Assistance Recommended at Discharge Set up Supervision/Assistance  Patient can return home with the following A little help with walking and/or transfers;A little help with bathing/dressing/bathroom;Assistance with cooking/housework;Assist for transportation;Help with stairs or ramp for entrance   Equipment Recommendations  None recommended by PT    Recommendations for Other Services       Precautions / Restrictions Precautions Precautions: Fall;Knee Precaution Booklet Issued: Yes (comment) Precaution Comments: reviewed no pillow under knee Restrictions Weight Bearing Restrictions: No RLE Weight Bearing: Weight bearing as tolerated     Mobility  Bed Mobility Overal bed mobility: Modified Independent Bed Mobility: Supine to Sit     Supine to sit: Modified independent (Device/Increase time), HOB elevated     General bed mobility comments: used rail    Transfers Overall transfer level: Needs assistance Equipment used: Rolling walker (2  wheels) Transfers: Sit to/from Stand Sit to Stand: Min guard           General transfer comment: VCs hand placement    Ambulation/Gait Ambulation/Gait assistance: Min guard Gait Distance (Feet): 120 Feet Assistive device: Rolling walker (2 wheels) Gait Pattern/deviations: Step-to pattern Gait velocity: decreased     General Gait Details: steady, no loss of balance, no buckling, VCs sequencing   Stairs             Wheelchair Mobility    Modified Rankin (Stroke Patients Only)       Balance Overall balance assessment: Needs assistance Sitting-balance support: Feet supported, No upper extremity supported Sitting balance-Leahy Scale: Good     Standing balance support: Reliant on assistive device for balance, During functional activity, Bilateral upper extremity supported Standing balance-Leahy Scale: Poor                              Cognition Arousal/Alertness: Awake/alert Behavior During Therapy: WFL for tasks assessed/performed Overall Cognitive Status: Within Functional Limits for tasks assessed                                          Exercises Total Joint Exercises Ankle Circles/Pumps: AROM, Both, 10 reps Quad Sets: AROM, Right, 5 reps, Supine Short Arc Quad: AROM, Right, 5 reps, Supine Heel Slides: AAROM, Right, 5 reps, Supine Hip ABduction/ADduction: AROM, Right, 5 reps, Supine Straight Leg Raises: AROM, Right, 5 reps, Supine Knee Flexion: AAROM, Right, 10 reps, Seated Goniometric ROM: 5-55* AAROM R knee    General Comments        Pertinent Vitals/Pain  Pain Assessment Pain Score: 5  Pain Location: right knee Pain Descriptors / Indicators: Operative site guarding, Aching Pain Intervention(s): Limited activity within patient's tolerance, Monitored during session, Premedicated before session, Ice applied    Home Living                          Prior Function            PT Goals (current goals  can now be found in the care plan section) Acute Rehab PT Goals Patient Stated Goal: Improve balance, go for walks PT Goal Formulation: With patient Time For Goal Achievement: 11/06/21 Potential to Achieve Goals: Good Progress towards PT goals: Progressing toward goals    Frequency    7X/week      PT Plan Current plan remains appropriate    Co-evaluation              AM-PAC PT "6 Clicks" Mobility   Outcome Measure  Help needed turning from your back to your side while in a flat bed without using bedrails?: None Help needed moving from lying on your back to sitting on the side of a flat bed without using bedrails?: A Little Help needed moving to and from a bed to a chair (including a wheelchair)?: A Little Help needed standing up from a chair using your arms (e.g., wheelchair or bedside chair)?: A Little Help needed to walk in hospital room?: A Little Help needed climbing 3-5 steps with a railing? : A Little 6 Click Score: 19    End of Session Equipment Utilized During Treatment: Gait belt Activity Tolerance: Patient tolerated treatment well;No increased pain Patient left: in chair;with call bell/phone within reach;with chair alarm set Nurse Communication: Mobility status PT Visit Diagnosis: Pain;Difficulty in walking, not elsewhere classified (R26.2) Pain - Right/Left: Right Pain - part of body: Knee     Time: 0932-6712 PT Time Calculation (min) (ACUTE ONLY): 33 min  Charges:  $Gait Training: 8-22 mins $Therapeutic Exercise: 8-22 mins                     Blondell Reveal Kistler PT 10/31/2021  [place]  Office [phone]

## 2021-10-31 NOTE — Progress Notes (Signed)
Physical Therapy Treatment Patient Details Name: Jade Mooney MRN: [id number] DOB: 1946-02-21 Today's Date: 10/31/2021   History of Present Illness Pt is a 76yo female presenting s/p R-TKA on 10/30/21. PMH: hx of breast cancer s/p mastectomy/chemo/radiation, GERD, HTN, hypothyroidism, hx of TIA, L-THA 2017, cervical fusion, L-TKA.    PT Comments    Pt is progressing well with mobility, she ambulated 140' with RW, no loss of balance. Stair training completed. She demonstrates good understanding of HEP. She is ready to DC home from a PT standpoint.    Recommendations for follow up therapy are one component of a multi-disciplinary discharge planning process, led by the attending physician.  Recommendations may be updated based on patient status, additional functional criteria and insurance authorization.  Follow Up Recommendations  Follow physician's recommendations for discharge plan and follow up therapies     Assistance Recommended at Discharge Set up Supervision/Assistance  Patient can return home with the following A little help with walking and/or transfers;A little help with bathing/dressing/bathroom;Assistance with cooking/housework;Assist for transportation;Help with stairs or ramp for entrance   Equipment Recommendations  None recommended by PT    Recommendations for Other Services       Precautions / Restrictions Precautions Precautions: Fall;Knee Precaution Booklet Issued: Yes (comment) Precaution Comments: reviewed no pillow under knee Restrictions Weight Bearing Restrictions: No RLE Weight Bearing: Weight bearing as tolerated     Mobility  Bed Mobility Overal bed mobility: Needs Assistance Bed Mobility: Sit to Supine     Supine to sit: Modified independent (Device/Increase time), HOB elevated Sit to supine: Min assist   General bed mobility comments: assist for RLE into bed    Transfers Overall transfer level: Needs assistance Equipment used:  Rolling walker (2 wheels) Transfers: Sit to/from Stand Sit to Stand: Min guard           General transfer comment: VCs hand placement    Ambulation/Gait Ambulation/Gait assistance: Min guard Gait Distance (Feet): 140 Feet Assistive device: Rolling walker (2 wheels) Gait Pattern/deviations: Step-to pattern Gait velocity: decreased     General Gait Details: steady, no loss of balance, no buckling, good sequencing   Stairs Stairs: Yes Stairs assistance: Min guard Stair Management: One rail Left, With cane, Step to pattern, Forwards Number of Stairs: 3 General stair comments: VCs sequencing, min/guard safety   Wheelchair Mobility    Modified Rankin (Stroke Patients Only)       Balance Overall balance assessment: Needs assistance Sitting-balance support: Feet supported, No upper extremity supported Sitting balance-Leahy Scale: Good     Standing balance support: Reliant on assistive device for balance, During functional activity, Bilateral upper extremity supported Standing balance-Leahy Scale: Poor                              Cognition Arousal/Alertness: Awake/alert Behavior During Therapy: WFL for tasks assessed/performed Overall Cognitive Status: Within Functional Limits for tasks assessed                                          Exercises Total Joint Exercises Ankle Circles/Pumps: AROM, Both, 10 reps Quad Sets: AROM, Right, 5 reps, Supine Short Arc Quad: AROM, Right, 5 reps, Supine Heel Slides: AAROM, Right, 5 reps, Supine Hip ABduction/ADduction: AROM, Right, 5 reps, Supine Straight Leg Raises: AROM, Right, 5 reps, Supine Long Arc Quad: Right, 5 reps, Seated,  AROM Knee Flexion: AAROM, Right, 10 reps, Seated Goniometric ROM: 5-55* AAROM R knee    General Comments        Pertinent Vitals/Pain Pain Assessment Pain Score: 5  Pain Location: right knee Pain Descriptors / Indicators: Aching, Sore Pain Intervention(s):  Limited activity within patient's tolerance, Monitored during session, Premedicated before session, Ice applied    Home Living                          Prior Function            PT Goals (current goals can now be found in the care plan section) Acute Rehab PT Goals Patient Stated Goal: Improve balance, go for walks PT Goal Formulation: With patient Time For Goal Achievement: 11/06/21 Potential to Achieve Goals: Good Progress towards PT goals: Progressing toward goals    Frequency    7X/week      PT Plan Current plan remains appropriate    Co-evaluation              AM-PAC PT "6 Clicks" Mobility   Outcome Measure  Help needed turning from your back to your side while in a flat bed without using bedrails?: None Help needed moving from lying on your back to sitting on the side of a flat bed without using bedrails?: A Little Help needed moving to and from a bed to a chair (including a wheelchair)?: None Help needed standing up from a chair using your arms (e.g., wheelchair or bedside chair)?: None Help needed to walk in hospital room?: None Help needed climbing 3-5 steps with a railing? : A Little 6 Click Score: 22    End of Session Equipment Utilized During Treatment: Gait belt Activity Tolerance: Patient tolerated treatment well;No increased pain Patient left: with call bell/phone within reach;in bed;with bed alarm set Nurse Communication: Mobility status PT Visit Diagnosis: Pain;Difficulty in walking, not elsewhere classified (R26.2) Pain - Right/Left: Right Pain - part of body: Knee     Time: 1610-9604 PT Time Calculation (min) (ACUTE ONLY): 34 min  Charges:  $Gait Training: 8-22 mins $Therapeutic Exercise: 8-22 mins                    Blondell Reveal Kistler PT 10/31/2021  [place]  Office [phone]

## 2021-10-31 NOTE — Plan of Care (Signed)
Problem: Education: Goal: Knowledge of General Education information will improve Description: Including pain rating scale, medication(s)/side effects and non-pharmacologic comfort measures Outcome: Progressing   Problem: Clinical Measurements: Goal: Ability to maintain clinical measurements within normal limits will improve Outcome: Progressing   Problem: Pain Managment: Goal: General experience of comfort will improve Outcome: [place] V Ozie Dimaria, RN 10/31/21 10:31 AM

## 2021-10-31 NOTE — TOC Transition Note (Signed)
Transition of Care [facility]) - CM/SW Discharge Note  Patient Details  Name: Jade Mooney MRN: [id number] Date of Birth: Aug 27, 1945  Transition of Care [facility]) CM/SW Contact:  Sherie Don, LCSW Phone Number: 10/31/2021, 9:38 AM  Clinical Narrative: Patient is expected to discharge home after working with PT. CSW met with patient to confirm discharge plan. Patient will go home with OPPT at [place]. Patient has a rolling walker, cane, chair that goes over the commode, and wheelchair at home so there are no DME needs at this time. TOC signing off.  Final next level of care: OP Rehab Barriers to Discharge: No Barriers Identified  Patient Goals and CMS Choice Patient states their goals for this hospitalization and ongoing recovery are:: Discharge home with OPPT at [place] Choice offered to / list presented to : NA  Discharge Plan and Services        DME Arranged: N/A DME Agency: NA HH Arranged: NA [place] Agency: NA  Readmission Risk Interventions     No data to display

## 2021-10-31 NOTE — Discharge Summary (Signed)
Physician Discharge Summary  Patient ID: Jade Mooney MRN: [id number] DOB/AGE: 11/09/1945 76 y.o.  Admit date: 10/30/2021 Discharge date: 10/31/2021  Admission Diagnoses: Right knee osteoarthritis  Discharge Diagnoses:  Principal Problem:   Osteoarthritis of right knee   Discharged Condition: good  Hospital Course: Patient was admitted on August 9 for a right total knee arthroplasty due to end-stage right knee osteoarthritis.  Patient tolerated surgery well.  She was sent to PACU in postop floor in stable condition.  She worked with physical therapy and was able to get up and ambulate.  No events overnight.  Postop day 1 Foley removed.  She was able to work with physical therapy and ambulate more.  Patient was able to be discharged.  Medications have been sent to the pharmacy, she will resume her Plavix for DVT prevention.  She will start outpatient physical therapy next week  Consults: None  Significant Diagnostic Studies: none  Treatments: IV hydration, antibiotics: Ancef, analgesia: acetaminophen and Dilaudid, therapies: PT, and surgery: Right TKA  Discharge Exam: Blood pressure 107/62, pulse (!) 59, temperature 97.9 F (36.6 C), temperature source Oral, resp. rate 17, height '5\' 2"'$  (1.575 m), weight 80.3 kg, SpO2 98 %. General appearance: alert, cooperative, appears stated age, and no distress Extremities: LIMITED ROM OF RIGHT KNEE, FULL DORSIFLEXION AND PLANTARFLEXION OF RIGHT ANKLE extremities normal, atraumatic, no cyanosis or edema Pulses: 2+ and symmetric Skin: Skin color, texture, turgor normal. No rashes or lesions Neurologic: Grossly normal Incision/Wound: Dressings C/D/I  Disposition: Discharge disposition: 01-Home or Self Care       Discharge Instructions     Call MD / Call 911   Complete by: As directed    If you experience chest pain or shortness of breath, CALL 911 and be transported to the hospital emergency room.  If you develope a fever above  101 F, pus (white drainage) or increased drainage or redness at the wound, or calf pain, call your surgeon's office.   Call MD / Call 911   Complete by: As directed    If you experience chest pain or shortness of breath, CALL 911 and be transported to the hospital emergency room.  If you develope a fever above 101 F, pus (white drainage) or increased drainage or redness at the wound, or calf pain, call your surgeon's office.   Constipation Prevention   Complete by: As directed    Drink plenty of fluids.  Prune juice may be helpful.  You may use a stool softener, such as Colace (over the counter) 100 mg twice a day.  Use MiraLax (over the counter) for constipation as needed.   Constipation Prevention   Complete by: As directed    Drink plenty of fluids.  Prune juice may be helpful.  You may use a stool softener, such as Colace (over the counter) 100 mg twice a day.  Use MiraLax (over the counter) for constipation as needed.   Diet - low sodium heart healthy   Complete by: As directed    Diet - low sodium heart healthy   Complete by: As directed    Discharge instructions   Complete by: As directed    Dr. Sydnee Cabal [facility] [address] [place], [place] [address] [phone]  TOTAL KNEE REPLACEMENT POSTOPERATIVE DIRECTIONS  Knee Rehabilitation, Guidelines Following Surgery  Results after knee surgery are often greatly improved when you follow the exercise, range of motion and muscle strengthening exercises prescribed by your doctor. Safety measures are also  important to protect the knee from further injury. Any time any of these exercises cause you to have increased pain or swelling in your knee joint, decrease the amount until you are comfortable again and slowly increase them. If you have problems or questions, call your caregiver or physical therapist for advice.   HOME CARE INSTRUCTIONS  Remove items at home which could result in a fall. This includes throw rugs  or furniture in walking pathways.  ICE to the affected knee every three hours for 30 minutes at a time and then as needed for pain and swelling.  Continue to use ice on the knee for pain and swelling from surgery. You may notice swelling that will progress down to the foot and ankle.  This is normal after surgery.  Elevate the leg when you are not up walking on it.   Continue to use the breathing machine which will help keep your temperature down.  It is common for your temperature to cycle up and down following surgery, especially at night when you are not up moving around and exerting yourself.  The breathing machine keeps your lungs expanded and your temperature down. Do not place pillow under knee, focus on keeping the knee straight while resting   DIET You may resume your previous home diet once your are discharged from the hospital.  DRESSING / WOUND CARE / SHOWERING Keep the surgical dressing until follow up.  The dressing is water proof, but you need to put extra covering over it like plastic wrap.  IF THE DRESSING FALLS OFF or the wound gets wet inside, change the dressing with sterile gauze.  Please use good hand washing techniques before changing the dressing.  Do not use any lotions or creams on the incision until instructed by your surgeon.   You may start showering once you are discharged home but do not submerge the incision under water.  You are sent home with an ACE bandage on over the leg, this can be removed at 3 days from surgery. At this time you can start showering. Please place the white TED stocking on the surgical leg after. This needs to be worn on the surgical leg for 2 weeks after surgery.   ACTIVITY Walk with your walker as instructed. Use walker as long as suggested by your caregivers. Avoid periods of inactivity such as sitting longer than an hour when not asleep. This helps prevent blood clots.  You may resume a sexual relationship in one month or when given the OK by  your doctor.  You may return to work once you are cleared by your doctor.  Do not drive a car for 6 weeks or until released by you surgeon.  Do not drive while taking narcotics.  WEIGHT BEARING Weight bearing as tolerated with assist device (walker, cane, etc) as directed, use it as long as suggested by your surgeon or therapist, typically at least 4-6 weeks.  POSTOPERATIVE CONSTIPATION PROTOCOL Constipation - defined medically as fewer than three stools per week and severe constipation as less than one stool per week.  One of the most common issues patients have following surgery is constipation.  Even if you have a regular bowel pattern at home, your normal regimen is likely to be disrupted due to multiple reasons following surgery.  Combination of anesthesia, postoperative narcotics, change in appetite and fluid intake all can affect your bowels.  In order to avoid complications following surgery, here are some recommendations in order to help you  during your recovery period.  Colace (docusate) - Pick up an over-the-counter form of Colace or another stool softener and take twice a day as long as you are requiring postoperative pain medications.  Take with a full glass of water daily.  If you experience loose stools or diarrhea, hold the colace until you stool forms back up.  If your symptoms do not get better within 1 week or if they get worse, check with your doctor.  Dulcolax (bisacodyl) - Pick up over-the-counter and take as directed by the product packaging as needed to assist with the movement of your bowels.  Take with a full glass of water.  Use this product as needed if not relieved by Colace only.   MiraLax (polyethylene glycol) - Pick up over-the-counter to have on hand.  MiraLax is a solution that will increase the amount of water in your bowels to assist with bowel movements.  Take as directed and can mix with a glass of water, juice, soda, coffee, or tea.  Take if you go more than  two days without a movement. Do not use MiraLax more than once per day. Call your doctor if you are still constipated or irregular after using this medication for 7 days in a row.  If you continue to have problems with postoperative constipation, please contact the office for further assistance and recommendations.  If you experience "the worst abdominal pain ever" or develop nausea or vomiting, please contact the office immediatly for further recommendations for treatment.  ITCHING  If you experience itching with your medications, try taking only a single pain pill, or even half a pain pill at a time.  You can also use Benadryl over the counter for itching or also to help with sleep.   TED HOSE STOCKINGS Wear the elastic stockings on both legs for two weeks following surgery during the day but you may remove then at night for sleeping.  Okay to remove ACE in 3 days, put TED on after  MEDICATIONS See your medication summary on the "After Visit Summary" that the nursing staff will review with you prior to discharge.  You may have some home medications which will be placed on hold until you complete the course of blood thinner medication.  It is important for you to complete the blood thinner medication as prescribed by your surgeon.  Continue your approved medications as instructed at time of discharge. If you were not previously taking any blood thinners prior to surgery please start taking Aspirin 325 mg tabs twice daily for 6 weeks. If you are unable to take Aspirin please let your doctor know.   PRECAUTIONS If you experience chest pain or shortness of breath - call 911 immediately for transfer to the hospital emergency department.  If you develop a fever greater that 101 F, purulent drainage from wound, increased redness or drainage from wound, foul odor from the wound/dressing, or calf pain - CONTACT YOUR SURGEON.                                                   FOLLOW-UP APPOINTMENTS Make  sure you keep all of your appointments after your operation with your surgeon and caregivers. You should call the office at the above phone number and make an appointment for approximately two weeks after the date of your surgery or  on the date instructed by your surgeon outlined in the "After Visit Summary".   RANGE OF MOTION AND STRENGTHENING EXERCISES  Rehabilitation of the knee is important following a knee injury or an operation. After just a few days of immobilization, the muscles of the thigh which control the knee become weakened and shrink (atrophy). Knee exercises are designed to build up the tone and strength of the thigh muscles and to improve knee motion. Often times heat used for twenty to thirty minutes before working out will loosen up your tissues and help with improving the range of motion but do not use heat for the first two weeks following surgery. These exercises can be done on a training (exercise) mat, on the floor, on a table or on a bed. Use what ever works the best and is most comfortable for you Knee exercises include:  Leg Lifts - While your knee is still immobilized in a splint or cast, you can do straight leg raises. Lift the leg to 60 degrees, hold for 3 sec, and slowly lower the leg. Repeat 10-20 times 2-3 times daily. Perform this exercise against resistance later as your knee gets better.  Quad and Hamstring Sets - Tighten up the muscle on the front of the thigh (Quad) and hold for 5-10 sec. Repeat this 10-20 times hourly. Hamstring sets are done by pushing the foot backward against an object and holding for 5-10 sec. Repeat as with quad sets.  Leg Slides: Lying on your back, slowly slide your foot toward your buttocks, bending your knee up off the floor (only go as far as is comfortable). Then slowly slide your foot back down until your leg is flat on the floor again. Angel Wings: Lying on your back spread your legs to the side as far apart as you can without causing  discomfort.  A rehabilitation program following serious knee injuries can speed recovery and prevent re-injury in the future due to weakened muscles. Contact your doctor or a physical therapist for more information on knee rehabilitation.   IF YOU ARE TRANSFERRED TO A SKILLED REHAB FACILITY If the patient is transferred to a skilled rehab facility following release from the hospital, a list of the current medications will be sent to the facility for the patient to continue.  When discharged from the skilled rehab facility, please have the facility set up the patient's [place] prior to being released. Also, the skilled facility will be responsible for providing the patient with their medications at time of release from the facility to include their pain medication, the muscle relaxants, and their blood thinner medication. If the patient is still at the rehab facility at time of the two week follow up appointment, the skilled rehab facility will also need to assist the patient in arranging follow up appointment in our office and any transportation needs.  MAKE SURE YOU:  Understand these instructions.  Get help right away if you are not doing well or get worse.    Pick up stool softner and laxative for home use following surgery while on pain medications. May shower starting three days after surgery. Please use a clean towel to pat the leg dry following showers. Continue to use ice for pain and swelling after surgery. Do not use any lotions or creams on the incision until instructed by you Start Aspirin immediately following surgery.   Discharge instructions   Complete by: As directed    Dr. Sydnee Cabal [facility] [address] [place], [place] [address] [phone]  TOTAL KNEE REPLACEMENT POSTOPERATIVE DIRECTIONS  Knee Rehabilitation, Guidelines Following Surgery  Results after knee surgery are often greatly improved when you follow the exercise,  range of motion and muscle strengthening exercises prescribed by your doctor. Safety measures are also important to protect the knee from further injury. Any time any of these exercises cause you to have increased pain or swelling in your knee joint, decrease the amount until you are comfortable again and slowly increase them. If you have problems or questions, call your caregiver or physical therapist for advice.   HOME CARE INSTRUCTIONS  Remove items at home which could result in a fall. This includes throw rugs or furniture in walking pathways.  ICE to the affected knee every three hours for 30 minutes at a time and then as needed for pain and swelling.  Continue to use ice on the knee for pain and swelling from surgery. You may notice swelling that will progress down to the foot and ankle.  This is normal after surgery.  Elevate the leg when you are not up walking on it.   Continue to use the breathing machine which will help keep your temperature down.  It is common for your temperature to cycle up and down following surgery, especially at night when you are not up moving around and exerting yourself.  The breathing machine keeps your lungs expanded and your temperature down. Do not place pillow under knee, focus on keeping the knee straight while resting   DIET You may resume your previous home diet once your are discharged from the hospital.  DRESSING / WOUND CARE / SHOWERING Keep the surgical dressing until follow up.  The dressing is water proof, but you need to put extra covering over it like plastic wrap.  IF THE DRESSING FALLS OFF or the wound gets wet inside, change the dressing with sterile gauze.  Please use good hand washing techniques before changing the dressing.  Do not use any lotions or creams on the incision until instructed by your surgeon.   You may start showering once you are discharged home but do not submerge the incision under water.  You are sent home with an ACE bandage  on over the leg, this can be removed at 3 days from surgery. At this time you can start showering. Please place the white TED stocking on the surgical leg after. This needs to be worn on the surgical leg for 2 weeks after surgery.   ACTIVITY Walk with your walker as instructed. Use walker as long as suggested by your caregivers. Avoid periods of inactivity such as sitting longer than an hour when not asleep. This helps prevent blood clots.  You may resume a sexual relationship in one month or when given the OK by your doctor.  You may return to work once you are cleared by your doctor.  Do not drive a car for 6 weeks or until released by you surgeon.  Do not drive while taking narcotics.  WEIGHT BEARING Weight bearing as tolerated with assist device (walker, cane, etc) as directed, use it as long as suggested by your surgeon or therapist, typically at least 4-6 weeks.  POSTOPERATIVE CONSTIPATION PROTOCOL Constipation - defined medically as fewer than three stools per week and severe constipation as less than one stool per week.  One of the most common issues patients have following surgery is constipation.  Even if you have a regular bowel pattern at  home, your normal regimen is likely to be disrupted due to multiple reasons following surgery.  Combination of anesthesia, postoperative narcotics, change in appetite and fluid intake all can affect your bowels.  In order to avoid complications following surgery, here are some recommendations in order to help you during your recovery period.  Colace (docusate) - Pick up an over-the-counter form of Colace or another stool softener and take twice a day as long as you are requiring postoperative pain medications.  Take with a full glass of water daily.  If you experience loose stools or diarrhea, hold the colace until you stool forms back up.  If your symptoms do not get better within 1 week or if they get worse, check with your doctor.  Dulcolax  (bisacodyl) - Pick up over-the-counter and take as directed by the product packaging as needed to assist with the movement of your bowels.  Take with a full glass of water.  Use this product as needed if not relieved by Colace only.   MiraLax (polyethylene glycol) - Pick up over-the-counter to have on hand.  MiraLax is a solution that will increase the amount of water in your bowels to assist with bowel movements.  Take as directed and can mix with a glass of water, juice, soda, coffee, or tea.  Take if you go more than two days without a movement. Do not use MiraLax more than once per day. Call your doctor if you are still constipated or irregular after using this medication for 7 days in a row.  If you continue to have problems with postoperative constipation, please contact the office for further assistance and recommendations.  If you experience "the worst abdominal pain ever" or develop nausea or vomiting, please contact the office immediatly for further recommendations for treatment.  ITCHING  If you experience itching with your medications, try taking only a single pain pill, or even half a pain pill at a time.  You can also use Benadryl over the counter for itching or also to help with sleep.   TED HOSE STOCKINGS Wear the elastic stockings on both legs for two weeks following surgery during the day but you may remove then at night for sleeping.  Okay to remove ACE in 3 days, put TED on after  MEDICATIONS See your medication summary on the "After Visit Summary" that the nursing staff will review with you prior to discharge.  You may have some home medications which will be placed on hold until you complete the course of blood thinner medication.  It is important for you to complete the blood thinner medication as prescribed by your surgeon.  Continue your approved medications as instructed at time of discharge. If you were not previously taking any blood thinners prior to surgery please start  taking Aspirin 325 mg tabs twice daily for 6 weeks. If you are unable to take Aspirin please let your doctor know.   PRECAUTIONS If you experience chest pain or shortness of breath - call 911 immediately for transfer to the hospital emergency department.  If you develop a fever greater that 101 F, purulent drainage from wound, increased redness or drainage from wound, foul odor from the wound/dressing, or calf pain - CONTACT YOUR SURGEON.  FOLLOW-UP APPOINTMENTS Make sure you keep all of your appointments after your operation with your surgeon and caregivers. You should call the office at the above phone number and make an appointment for approximately two weeks after the date of your surgery or on the date instructed by your surgeon outlined in the "After Visit Summary".   RANGE OF MOTION AND STRENGTHENING EXERCISES  Rehabilitation of the knee is important following a knee injury or an operation. After just a few days of immobilization, the muscles of the thigh which control the knee become weakened and shrink (atrophy). Knee exercises are designed to build up the tone and strength of the thigh muscles and to improve knee motion. Often times heat used for twenty to thirty minutes before working out will loosen up your tissues and help with improving the range of motion but do not use heat for the first two weeks following surgery. These exercises can be done on a training (exercise) mat, on the floor, on a table or on a bed. Use what ever works the best and is most comfortable for you Knee exercises include:  Leg Lifts - While your knee is still immobilized in a splint or cast, you can do straight leg raises. Lift the leg to 60 degrees, hold for 3 sec, and slowly lower the leg. Repeat 10-20 times 2-3 times daily. Perform this exercise against resistance later as your knee gets better.  Quad and Hamstring Sets - Tighten up the muscle on the front of the  thigh (Quad) and hold for 5-10 sec. Repeat this 10-20 times hourly. Hamstring sets are done by pushing the foot backward against an object and holding for 5-10 sec. Repeat as with quad sets.  Leg Slides: Lying on your back, slowly slide your foot toward your buttocks, bending your knee up off the floor (only go as far as is comfortable). Then slowly slide your foot back down until your leg is flat on the floor again. Angel Wings: Lying on your back spread your legs to the side as far apart as you can without causing discomfort.  A rehabilitation program following serious knee injuries can speed recovery and prevent re-injury in the future due to weakened muscles. Contact your doctor or a physical therapist for more information on knee rehabilitation.   IF YOU ARE TRANSFERRED TO A SKILLED REHAB FACILITY If the patient is transferred to a skilled rehab facility following release from the hospital, a list of the current medications will be sent to the facility for the patient to continue.  When discharged from the skilled rehab facility, please have the facility set up the patient's [place] prior to being released. Also, the skilled facility will be responsible for providing the patient with their medications at time of release from the facility to include their pain medication, the muscle relaxants, and their blood thinner medication. If the patient is still at the rehab facility at time of the two week follow up appointment, the skilled rehab facility will also need to assist the patient in arranging follow up appointment in our office and any transportation needs.  MAKE SURE YOU:  Understand these instructions.  Get help right away if you are not doing well or get worse.    Pick up stool softner and laxative for home use following surgery while on pain medications. May shower starting three days after surgery. Please use a clean towel to pat the leg dry following  showers. Continue to use ice for  pain and swelling after surgery. Do not use any lotions or creams on the incision until instructed by you Start Aspirin immediately following surgery.   Do not put a pillow under the knee. Place it under the heel.   Complete by: As directed    Do not put a pillow under the knee. Place it under the heel.   Complete by: As directed    Driving restrictions   Complete by: As directed    No driving for two weeks   Driving restrictions   Complete by: As directed    No driving for two weeks   Post-operative opioid taper instructions:   Complete by: As directed    POST-OPERATIVE OPIOID TAPER INSTRUCTIONS: It is important to wean off of your opioid medication as soon as possible. If you do not need pain medication after your surgery it is ok to stop day one. Opioids include: Codeine, Hydrocodone(Norco, Vicodin), Oxycodone(Percocet, oxycontin) and hydromorphone amongst others.  Long term and even short term use of opiods can cause: Increased pain response Dependence Constipation Depression Respiratory depression And more.  Withdrawal symptoms can include Flu like symptoms Nausea, vomiting And more Techniques to manage these symptoms Hydrate well Eat regular healthy meals Stay active Use relaxation techniques(deep breathing, meditating, yoga) Do Not substitute Alcohol to help with tapering If you have been on opioids for less than two weeks and do not have pain than it is ok to stop all together.  Plan to wean off of opioids This plan should start within one week post op of your joint replacement. Maintain the same interval or time between taking each dose and first decrease the dose.  Cut the total daily intake of opioids by one tablet each day Next start to increase the time between doses. The last dose that should be eliminated is the evening dose.      Post-operative opioid taper instructions:   Complete by: As directed    POST-OPERATIVE  OPIOID TAPER INSTRUCTIONS: It is important to wean off of your opioid medication as soon as possible. If you do not need pain medication after your surgery it is ok to stop day one. Opioids include: Codeine, Hydrocodone(Norco, Vicodin), Oxycodone(Percocet, oxycontin) and hydromorphone amongst others.  Long term and even short term use of opiods can cause: Increased pain response Dependence Constipation Depression Respiratory depression And more.  Withdrawal symptoms can include Flu like symptoms Nausea, vomiting And more Techniques to manage these symptoms Hydrate well Eat regular healthy meals Stay active Use relaxation techniques(deep breathing, meditating, yoga) Do Not substitute Alcohol to help with tapering If you have been on opioids for less than two weeks and do not have pain than it is ok to stop all together.  Plan to wean off of opioids This plan should start within one week post op of your joint replacement. Maintain the same interval or time between taking each dose and first decrease the dose.  Cut the total daily intake of opioids by one tablet each day Next start to increase the time between doses. The last dose that should be eliminated is the evening dose.      TED hose   Complete by: As directed    Use stockings (TED hose) for three weeks on both leg(s).  You may remove them at night for sleeping.   TED hose   Complete by: As directed    Use stockings (TED hose) for three weeks on both leg(s).  You may remove them at night for  sleeping.   Weight bearing as tolerated   Complete by: As directed    Weight bearing as tolerated   Complete by: As directed       Allergies as of 10/31/2021       Reactions   Amoxicillin Hives   Has patient had a PCN reaction causing immediate rash, facial/tongue/throat swelling, SOB or lightheadedness with hypotension:unsure Has patient had a PCN reaction causing severe rash involving mucus membranes or skin necrosis:No Has  patient had a PCN reaction that required hospitalization:No Has patient had a PCN reaction occurring within the last 10 years: Yes If all of the above answers are "NO", then may proceed with Cephalosporin use.   Erythromycin Other (See Comments)   Oxycodone Hcl Nausea And Vomiting   Percocet [oxycodone-acetaminophen] Nausea And Vomiting   Percodan [oxycodone-aspirin] Nausea And Vomiting   Tramadol Itching        Medication List     TAKE these medications    acetaminophen 500 MG tablet Commonly known as: TYLENOL Take 2 tablets (1,000 mg total) by mouth every 8 (eight) hours as needed. What changed: You were already taking a medication with the same name, and this prescription was added. Make sure you understand how and when to take each.   acetaminophen 500 MG tablet Commonly known as: TYLENOL Take 1,000 mg by mouth every 6 (six) hours as needed for mild pain. What changed: Another medication with the same name was added. Make sure you understand how and when to take each.   acidophilus Caps capsule Take 1 capsule by mouth daily.   ascorbic acid 1000 MG tablet Commonly known as: VITAMIN C Take 1,000 mg by mouth daily.   aspirin 325 MG tablet Take 325 mg by mouth daily.   beta carotene 15 MG capsule Take 15 mg by mouth daily.   Biotin 5000 MCG Tabs Take 5,000 mcg by mouth daily.   calcium citrate-vitamin D 315-200 MG-UNIT tablet Commonly known as: CITRACAL+D Take 1 tablet by mouth in the morning and at bedtime.   cephALEXin 500 MG capsule Commonly known as: KEFLEX Take 1 capsule (500 mg total) by mouth 4 (four) times daily for 3 days.   cholecalciferol 1000 units tablet Commonly known as: VITAMIN D Take 1,000 Units by mouth daily.   clopidogrel 75 MG tablet Commonly known as: PLAVIX Take 1 tablet (75 mg total) by mouth daily.   cyanocobalamin 1000 MCG/ML injection Commonly known as: VITAMIN B12 Inject 1,000 mcg into the muscle every 30 (thirty) days.    famotidine 40 MG tablet Commonly known as: PEPCID Take 40 mg by mouth daily.   gabapentin 300 MG capsule Commonly known as: NEURONTIN Take 1 capsule (300 mg total) by mouth at bedtime for 21 days.   glucosamine-chondroitin 500-400 MG tablet Take 1 tablet by mouth daily.   HYDROmorphone 2 MG tablet Commonly known as: Dilaudid Take 1 tablet (2 mg total) by mouth every 4 (four) hours as needed for up to 7 days for severe pain.   ketotifen 0.025 % ophthalmic solution Commonly known as: ZADITOR Place 1 drop into both eyes daily as needed (allergies).   levothyroxine 88 MCG tablet Commonly known as: SYNTHROID Take 88 mcg by mouth daily before breakfast.   LORazepam 1 MG tablet Commonly known as: ATIVAN Take 1 tablet (1 mg total) by mouth every 8 (eight) hours as needed for anxiety.   Lutein 20 MG Caps Take 20 mg by mouth daily.   meclizine 25 MG tablet Commonly known  as: ANTIVERT Take 25 mg by mouth 3 (three) times daily as needed for dizziness.   methocarbamol 500 MG tablet Commonly known as: ROBAXIN Take 1-2 tablets (500-1,000 mg total) by mouth every 6 (six) hours as needed for muscle spasms.   metroNIDAZOLE 0.75 % cream Commonly known as: METROCREAM Apply 1 Application topically daily.   multivitamin with minerals tablet Take 1 tablet by mouth daily.   Omega-3 1000 MG Caps Take 1,000 mg by mouth in the morning and at bedtime.   ondansetron 4 MG tablet Commonly known as: Zofran Take 1 tablet (4 mg total) by mouth daily as needed for nausea or vomiting.   palbociclib 75 MG capsule Commonly known as: IBRANCE Take 1 capsule (75 mg total) by mouth daily with breakfast. Take whole with food on days 1-21, every 28 days. What changed:  when to take this additional instructions   pantoprazole 40 MG tablet Commonly known as: PROTONIX Take 40 mg by mouth 2 (two) times daily. Before breakfast and before supper   Polyethyl Glycol-Propyl Glycol 0.4-0.3 % Soln Place  1-2 drops into both eyes 2 (two) times daily.   RA Natural Magnesium 250 MG Tabs Generic drug: Magnesium Take 250 mg by mouth daily.   rOPINIRole 2 MG tablet Commonly known as: REQUIP Take 4 mg by mouth at bedtime.   rosuvastatin 20 MG tablet Commonly known as: CRESTOR Take 1 tablet (20 mg total) by mouth daily.   vitamin E 180 MG (400 UNITS) capsule Take 400 Units by mouth daily.   XGEVA Jennings Inject 1 Dose into the skin every 30 (thirty) days.   zinc gluconate 50 MG tablet Take 50 mg by mouth daily.               Discharge Care Instructions  (From admission, onward)           Start     Ordered   10/31/21 0000  Weight bearing as tolerated        10/31/21 0631   10/30/21 0000  Weight bearing as tolerated        10/30/21 1354            Follow-up Information     Drue Novel, PA. Go on 11/15/2021.   Specialty: Orthopedic Surgery Why: You are scheduled for a follow up appointment on 11-15-21 at 9:50 am. Contact information: [address] [address] [phone]                 Signed: Derrick Ravel [phone] 10/31/2021, 6:31 AM

## 2021-11-05 ENCOUNTER — Other Ambulatory Visit: Payer: Medicare Other

## 2021-11-05 DIAGNOSIS — M6281 Muscle weakness (generalized): Secondary | ICD-10-CM | POA: Diagnosis not present

## 2021-11-05 DIAGNOSIS — Z96651 Presence of right artificial knee joint: Secondary | ICD-10-CM | POA: Diagnosis not present

## 2021-11-05 DIAGNOSIS — M25561 Pain in right knee: Secondary | ICD-10-CM | POA: Diagnosis not present

## 2021-11-05 DIAGNOSIS — R2689 Other abnormalities of gait and mobility: Secondary | ICD-10-CM | POA: Diagnosis not present

## 2021-11-07 ENCOUNTER — Ambulatory Visit: Payer: Medicare Other

## 2021-11-07 DIAGNOSIS — M6281 Muscle weakness (generalized): Secondary | ICD-10-CM | POA: Diagnosis not present

## 2021-11-07 DIAGNOSIS — M25561 Pain in right knee: Secondary | ICD-10-CM | POA: Diagnosis not present

## 2021-11-07 DIAGNOSIS — Z96651 Presence of right artificial knee joint: Secondary | ICD-10-CM | POA: Diagnosis not present

## 2021-11-07 DIAGNOSIS — R2689 Other abnormalities of gait and mobility: Secondary | ICD-10-CM | POA: Diagnosis not present

## 2021-11-08 DIAGNOSIS — M6281 Muscle weakness (generalized): Secondary | ICD-10-CM | POA: Diagnosis not present

## 2021-11-08 DIAGNOSIS — Z96651 Presence of right artificial knee joint: Secondary | ICD-10-CM | POA: Diagnosis not present

## 2021-11-08 DIAGNOSIS — M25561 Pain in right knee: Secondary | ICD-10-CM | POA: Diagnosis not present

## 2021-11-08 DIAGNOSIS — R2689 Other abnormalities of gait and mobility: Secondary | ICD-10-CM | POA: Diagnosis not present

## 2021-11-11 DIAGNOSIS — M25561 Pain in right knee: Secondary | ICD-10-CM | POA: Diagnosis not present

## 2021-11-11 DIAGNOSIS — Z96651 Presence of right artificial knee joint: Secondary | ICD-10-CM | POA: Diagnosis not present

## 2021-11-11 DIAGNOSIS — R2689 Other abnormalities of gait and mobility: Secondary | ICD-10-CM | POA: Diagnosis not present

## 2021-11-11 DIAGNOSIS — M6281 Muscle weakness (generalized): Secondary | ICD-10-CM | POA: Diagnosis not present

## 2021-11-12 ENCOUNTER — Inpatient Hospital Stay: Payer: Medicare Other | Attending: Oncology

## 2021-11-12 DIAGNOSIS — Z170 Estrogen receptor positive status [ER+]: Secondary | ICD-10-CM | POA: Diagnosis not present

## 2021-11-12 DIAGNOSIS — C50911 Malignant neoplasm of unspecified site of right female breast: Secondary | ICD-10-CM | POA: Insufficient documentation

## 2021-11-12 DIAGNOSIS — R2689 Other abnormalities of gait and mobility: Secondary | ICD-10-CM | POA: Diagnosis not present

## 2021-11-12 DIAGNOSIS — C50919 Malignant neoplasm of unspecified site of unspecified female breast: Secondary | ICD-10-CM

## 2021-11-12 DIAGNOSIS — Z5111 Encounter for antineoplastic chemotherapy: Secondary | ICD-10-CM | POA: Insufficient documentation

## 2021-11-12 DIAGNOSIS — C7951 Secondary malignant neoplasm of bone: Secondary | ICD-10-CM | POA: Diagnosis not present

## 2021-11-12 DIAGNOSIS — M25561 Pain in right knee: Secondary | ICD-10-CM | POA: Diagnosis not present

## 2021-11-12 DIAGNOSIS — M6281 Muscle weakness (generalized): Secondary | ICD-10-CM | POA: Diagnosis not present

## 2021-11-12 DIAGNOSIS — Z96651 Presence of right artificial knee joint: Secondary | ICD-10-CM | POA: Diagnosis not present

## 2021-11-12 DIAGNOSIS — C787 Secondary malignant neoplasm of liver and intrahepatic bile duct: Secondary | ICD-10-CM | POA: Diagnosis not present

## 2021-11-12 DIAGNOSIS — D649 Anemia, unspecified: Secondary | ICD-10-CM | POA: Diagnosis not present

## 2021-11-12 LAB — CBC AND DIFFERENTIAL
HCT: 29 — AB (ref 36–46)
Hemoglobin: 9.9 — AB (ref 12.0–16.0)
Neutrophils Absolute: 6.93
Platelets: 433 10*3/uL — AB (ref 150–400)
WBC: 9

## 2021-11-12 LAB — BASIC METABOLIC PANEL
BUN: 13 (ref 4–21)
CO2: 25 — AB (ref 13–22)
Chloride: 97 — AB (ref 99–108)
Creatinine: 0.7 (ref 0.5–1.1)
Glucose: 111
Potassium: 4.5 mEq/L (ref 3.5–5.1)
Sodium: 130 — AB (ref 137–147)

## 2021-11-12 LAB — COMPREHENSIVE METABOLIC PANEL
Albumin: 4.5 (ref 3.5–5.0)
Calcium: 9.4 (ref 8.7–10.7)

## 2021-11-12 LAB — CBC: RBC: 3.29 — AB (ref 3.87–5.11)

## 2021-11-12 LAB — HEPATIC FUNCTION PANEL
ALT: 25 U/L (ref 7–35)
AST: 37 — AB (ref 13–35)
Alkaline Phosphatase: 61 (ref 25–125)
Bilirubin, Total: 0.9

## 2021-11-13 ENCOUNTER — Other Ambulatory Visit: Payer: Self-pay | Admitting: Oncology

## 2021-11-13 LAB — CANCER ANTIGEN 27.29: CA 27.29: 27.1 U/mL (ref 0.0–38.6)

## 2021-11-14 ENCOUNTER — Other Ambulatory Visit: Payer: Self-pay | Admitting: Pharmacist

## 2021-11-14 ENCOUNTER — Inpatient Hospital Stay: Payer: Medicare Other

## 2021-11-14 VITALS — BP 147/65 | HR 67 | Temp 98.4°F | Resp 18 | Ht 62.0 in | Wt 178.0 lb

## 2021-11-14 DIAGNOSIS — R2689 Other abnormalities of gait and mobility: Secondary | ICD-10-CM | POA: Diagnosis not present

## 2021-11-14 DIAGNOSIS — C50919 Malignant neoplasm of unspecified site of unspecified female breast: Secondary | ICD-10-CM

## 2021-11-14 DIAGNOSIS — M25561 Pain in right knee: Secondary | ICD-10-CM | POA: Diagnosis not present

## 2021-11-14 DIAGNOSIS — Z170 Estrogen receptor positive status [ER+]: Secondary | ICD-10-CM | POA: Diagnosis not present

## 2021-11-14 DIAGNOSIS — Z96651 Presence of right artificial knee joint: Secondary | ICD-10-CM | POA: Diagnosis not present

## 2021-11-14 DIAGNOSIS — Z5111 Encounter for antineoplastic chemotherapy: Secondary | ICD-10-CM | POA: Diagnosis not present

## 2021-11-14 DIAGNOSIS — M6281 Muscle weakness (generalized): Secondary | ICD-10-CM | POA: Diagnosis not present

## 2021-11-14 DIAGNOSIS — C50911 Malignant neoplasm of unspecified site of right female breast: Secondary | ICD-10-CM | POA: Diagnosis not present

## 2021-11-14 DIAGNOSIS — C787 Secondary malignant neoplasm of liver and intrahepatic bile duct: Secondary | ICD-10-CM | POA: Diagnosis not present

## 2021-11-14 DIAGNOSIS — C7951 Secondary malignant neoplasm of bone: Secondary | ICD-10-CM | POA: Diagnosis not present

## 2021-11-14 MED ORDER — FULVESTRANT 250 MG/5ML IM SOSY
500.0000 mg | PREFILLED_SYRINGE | Freq: Once | INTRAMUSCULAR | Status: AC
  Administered 2021-11-14: 500 mg via INTRAMUSCULAR
  Filled 2021-11-14: qty 10

## 2021-11-14 MED ORDER — DENOSUMAB 120 MG/1.7ML ~~LOC~~ SOLN
120.0000 mg | Freq: Once | SUBCUTANEOUS | Status: AC
  Administered 2021-11-14: 120 mg via SUBCUTANEOUS
  Filled 2021-11-14: qty 1.7

## 2021-11-14 NOTE — Patient Instructions (Signed)
Denosumab Injection (Oncology) What is this medication? DENOSUMAB (den oh SUE mab) prevents weakened bones caused by cancer. It may also be used to treat noncancerous bone tumors that cannot be removed by surgery. It can also be used to treat high calcium levels in the blood caused by cancer. It works by blocking a protein that causes bones to break down quickly. This slows down the release of calcium from bones, which lowers calcium levels in your blood. It also makes your bones stronger and less likely to break (fracture). This medicine may be used for other purposes; ask your health care provider or pharmacist if you have questions. COMMON BRAND NAME(S): XGEVA What should I tell my care team before I take this medication? They need to know if you have any of these conditions: Dental disease Having surgery or tooth extraction Infection Kidney disease Low levels of calcium or vitamin D in the blood Malnutrition On hemodialysis Skin conditions or sensitivity Thyroid or parathyroid disease An unusual reaction to denosumab, other medications, foods, dyes, or preservatives Pregnant or trying to get pregnant Breast-feeding How should I use this medication? This medication is for injection under the skin. It is given by your care team in a hospital or clinic setting. A special MedGuide will be given to you before each treatment. Be sure to read this information carefully each time. Talk to your care team about the use of this medication in children. While it may be prescribed for children as young as 13 years for selected conditions, precautions do apply. Overdosage: If you think you have taken too much of this medicine contact a poison control center or emergency room at once. NOTE: This medicine is only for you. Do not share this medicine with others. What if I miss a dose? Keep appointments for follow-up doses. It is important not to miss your dose. Call your care team if you are unable to  keep an appointment. What may interact with this medication? Do not take this medication with any of the following: Other medications containing denosumab This medication may also interact with the following: Medications that lower your chance of fighting infection Steroid medications, such as prednisone or cortisone This list may not describe all possible interactions. Give your health care provider a list of all the medicines, herbs, non-prescription drugs, or dietary supplements you use. Also tell them if you smoke, drink alcohol, or use illegal drugs. Some items may interact with your medicine. What should I watch for while using this medication? Your condition will be monitored carefully while you are receiving this medication. You may need blood work while taking this medication. This medication may increase your risk of getting an infection. Call your care team for advice if you get a fever, chills, sore throat, or other symptoms of a cold or flu. Do not treat yourself. Try to avoid being around people who are sick. You should make sure you get enough calcium and vitamin D while you are taking this medication, unless your care team tells you not to. Discuss the foods you eat and the vitamins you take with your care team. Some people who take this medication have severe bone, joint, or muscle pain. This medication may also increase your risk for jaw problems or a broken thigh bone. Tell your care team right away if you have severe pain in your jaw, bones, joints, or muscles. Tell your care team if you have any pain that does not go away or that gets worse. Talk   to your care team if you may be pregnant. Serious birth defects can occur if you take this medication during pregnancy and for 5 months after the last dose. You will need a negative pregnancy test before starting this medication. Contraception is recommended while taking this medication and for 5 months after the last dose. Your care team  can help you find the option that works for you. What side effects may I notice from receiving this medication? Side effects that you should report to your care team as soon as possible: Allergic reactions--skin rash, itching, hives, swelling of the face, lips, tongue, or throat Bone, joint, or muscle pain Low calcium level--muscle pain or cramps, confusion, tingling, or numbness in the hands or feet Osteonecrosis of the jaw--pain, swelling, or redness in the mouth, numbness of the jaw, poor healing after dental work, unusual discharge from the mouth, visible bones in the mouth Side effects that usually do not require medical attention (report to your care team if they continue or are bothersome): Cough Diarrhea Fatigue Headache Nausea This list may not describe all possible side effects. Call your doctor for medical advice about side effects. You may report side effects to FDA at 1-800-FDA-1088. Where should I keep my medication? This medication is given in a hospital or clinic. It will not be stored at home. NOTE: This sheet is a summary. It may not cover all possible information. If you have questions about this medicine, talk to your doctor, pharmacist, or health care provider.  2023 Elsevier/Gold Standard (2021-07-29 00:00:00) Fulvestrant Injection What is this medication? FULVESTRANT (ful VES trant) treats breast cancer. It works by blocking the hormone estrogen in breast tissue, which prevents breast cancer cells from spreading or growing. This medicine may be used for other purposes; ask your health care provider or pharmacist if you have questions. COMMON BRAND NAME(S): FASLODEX What should I tell my care team before I take this medication? They need to know if you have any of these conditions: Bleeding disorder Liver disease Low blood cell levels, such as low white cells, red cells, and platelets An unusual or allergic reaction to fulvestrant, other medications, foods, dyes, or  preservatives Pregnant or trying to get pregnant Breast-feeding How should I use this medication? This medication is injected into a muscle. It is given by your care team in a hospital or clinic setting. Talk to your care team about the use of this medication in children. Special care may be needed. Overdosage: If you think you have taken too much of this medicine contact a poison control center or emergency room at once. NOTE: This medicine is only for you. Do not share this medicine with others. What if I miss a dose? Keep appointments for follow-up doses. It is important not to miss your dose. Call your care team if you are unable to keep an appointment. What may interact with this medication? Certain medications that prevent or treat blood clots, such as warfarin, enoxaparin, dalteparin, apixaban, dabigatran, rivaroxaban This list may not describe all possible interactions. Give your health care provider a list of all the medicines, herbs, non-prescription drugs, or dietary supplements you use. Also tell them if you smoke, drink alcohol, or use illegal drugs. Some items may interact with your medicine. What should I watch for while using this medication? Your condition will be monitored carefully while you are receiving this medication. You may need blood work while taking this medication. Talk to your care team if you may be pregnant. Serious  birth defects can occur if you take this medication during pregnancy and for 1 year after the last dose. You will need a negative pregnancy test before starting this medication. Contraception is recommended while taking this medication and for 1 year after the last dose. Your care team can help you find the option that works for you. Do not breastfeed while taking this medication and for 1 year after the last dose. This medication may cause infertility. Talk to your care team if you are concerned about your fertility. What side effects may I notice from  receiving this medication? Side effects that you should report to your care team as soon as possible: Allergic reactions or angioedema--skin rash, itching or hives, swelling of the face, eyes, lips, tongue, arms, or legs, trouble swallowing or breathing Pain, tingling, or numbness in the hands or feet Side effects that usually do not require medical attention (report to your care team if they continue or are bothersome): Bone, joint, or muscle pain Constipation Headache Hot flashes Nausea Pain, redness, or irritation at injection site Unusual weakness or fatigue This list may not describe all possible side effects. Call your doctor for medical advice about side effects. You may report side effects to FDA at 1-800-FDA-1088. Where should I keep my medication? This medication is given in a hospital or clinic. It will not be stored at home. NOTE: This sheet is a summary. It may not cover all possible information. If you have questions about this medicine, talk to your doctor, pharmacist, or health care provider.  2023 [employer]/Gold Standard (2021-07-22 00:00:00)  

## 2021-11-15 DIAGNOSIS — Z4789 Encounter for other orthopedic aftercare: Secondary | ICD-10-CM | POA: Diagnosis not present

## 2021-11-18 DIAGNOSIS — M25561 Pain in right knee: Secondary | ICD-10-CM | POA: Diagnosis not present

## 2021-11-18 DIAGNOSIS — R2689 Other abnormalities of gait and mobility: Secondary | ICD-10-CM | POA: Diagnosis not present

## 2021-11-18 DIAGNOSIS — M6281 Muscle weakness (generalized): Secondary | ICD-10-CM | POA: Diagnosis not present

## 2021-11-18 DIAGNOSIS — Z96651 Presence of right artificial knee joint: Secondary | ICD-10-CM | POA: Diagnosis not present

## 2021-11-20 DIAGNOSIS — M25561 Pain in right knee: Secondary | ICD-10-CM | POA: Diagnosis not present

## 2021-11-20 DIAGNOSIS — R2689 Other abnormalities of gait and mobility: Secondary | ICD-10-CM | POA: Diagnosis not present

## 2021-11-20 DIAGNOSIS — M6281 Muscle weakness (generalized): Secondary | ICD-10-CM | POA: Diagnosis not present

## 2021-11-20 DIAGNOSIS — Z96651 Presence of right artificial knee joint: Secondary | ICD-10-CM | POA: Diagnosis not present

## 2021-11-22 DIAGNOSIS — M25561 Pain in right knee: Secondary | ICD-10-CM | POA: Diagnosis not present

## 2021-11-22 DIAGNOSIS — Z96651 Presence of right artificial knee joint: Secondary | ICD-10-CM | POA: Diagnosis not present

## 2021-11-22 DIAGNOSIS — R2689 Other abnormalities of gait and mobility: Secondary | ICD-10-CM | POA: Diagnosis not present

## 2021-11-22 DIAGNOSIS — M6281 Muscle weakness (generalized): Secondary | ICD-10-CM | POA: Diagnosis not present

## 2021-11-22 DIAGNOSIS — M25661 Stiffness of right knee, not elsewhere classified: Secondary | ICD-10-CM | POA: Diagnosis not present

## 2021-11-25 ENCOUNTER — Other Ambulatory Visit: Payer: Self-pay | Admitting: Pharmacist

## 2021-11-26 DIAGNOSIS — Z96651 Presence of right artificial knee joint: Secondary | ICD-10-CM | POA: Diagnosis not present

## 2021-11-26 DIAGNOSIS — M6281 Muscle weakness (generalized): Secondary | ICD-10-CM | POA: Diagnosis not present

## 2021-11-26 DIAGNOSIS — M25661 Stiffness of right knee, not elsewhere classified: Secondary | ICD-10-CM | POA: Diagnosis not present

## 2021-11-26 DIAGNOSIS — R2689 Other abnormalities of gait and mobility: Secondary | ICD-10-CM | POA: Diagnosis not present

## 2021-11-26 DIAGNOSIS — M25561 Pain in right knee: Secondary | ICD-10-CM | POA: Diagnosis not present

## 2021-11-27 DIAGNOSIS — Z96651 Presence of right artificial knee joint: Secondary | ICD-10-CM | POA: Diagnosis not present

## 2021-11-27 DIAGNOSIS — M25661 Stiffness of right knee, not elsewhere classified: Secondary | ICD-10-CM | POA: Diagnosis not present

## 2021-11-27 DIAGNOSIS — D510 Vitamin B12 deficiency anemia due to intrinsic factor deficiency: Secondary | ICD-10-CM | POA: Diagnosis not present

## 2021-11-27 DIAGNOSIS — M25561 Pain in right knee: Secondary | ICD-10-CM | POA: Diagnosis not present

## 2021-11-27 DIAGNOSIS — R2689 Other abnormalities of gait and mobility: Secondary | ICD-10-CM | POA: Diagnosis not present

## 2021-11-27 DIAGNOSIS — M6281 Muscle weakness (generalized): Secondary | ICD-10-CM | POA: Diagnosis not present

## 2021-11-29 DIAGNOSIS — M25561 Pain in right knee: Secondary | ICD-10-CM | POA: Diagnosis not present

## 2021-11-29 DIAGNOSIS — M25661 Stiffness of right knee, not elsewhere classified: Secondary | ICD-10-CM | POA: Diagnosis not present

## 2021-11-29 DIAGNOSIS — Z96651 Presence of right artificial knee joint: Secondary | ICD-10-CM | POA: Diagnosis not present

## 2021-11-29 DIAGNOSIS — M6281 Muscle weakness (generalized): Secondary | ICD-10-CM | POA: Diagnosis not present

## 2021-11-29 DIAGNOSIS — R2689 Other abnormalities of gait and mobility: Secondary | ICD-10-CM | POA: Diagnosis not present

## 2021-12-02 ENCOUNTER — Ambulatory Visit: Payer: Medicare Other | Admitting: Oncology

## 2021-12-02 ENCOUNTER — Other Ambulatory Visit: Payer: Medicare Other

## 2021-12-02 DIAGNOSIS — M25561 Pain in right knee: Secondary | ICD-10-CM | POA: Diagnosis not present

## 2021-12-02 DIAGNOSIS — M6281 Muscle weakness (generalized): Secondary | ICD-10-CM | POA: Diagnosis not present

## 2021-12-02 DIAGNOSIS — R2689 Other abnormalities of gait and mobility: Secondary | ICD-10-CM | POA: Diagnosis not present

## 2021-12-02 DIAGNOSIS — Z96651 Presence of right artificial knee joint: Secondary | ICD-10-CM | POA: Diagnosis not present

## 2021-12-02 DIAGNOSIS — M25661 Stiffness of right knee, not elsewhere classified: Secondary | ICD-10-CM | POA: Diagnosis not present

## 2021-12-04 DIAGNOSIS — Z96651 Presence of right artificial knee joint: Secondary | ICD-10-CM | POA: Diagnosis not present

## 2021-12-04 DIAGNOSIS — R2689 Other abnormalities of gait and mobility: Secondary | ICD-10-CM | POA: Diagnosis not present

## 2021-12-04 DIAGNOSIS — M6281 Muscle weakness (generalized): Secondary | ICD-10-CM | POA: Diagnosis not present

## 2021-12-04 DIAGNOSIS — M25661 Stiffness of right knee, not elsewhere classified: Secondary | ICD-10-CM | POA: Diagnosis not present

## 2021-12-04 DIAGNOSIS — M25561 Pain in right knee: Secondary | ICD-10-CM | POA: Diagnosis not present

## 2021-12-05 ENCOUNTER — Ambulatory Visit: Payer: Medicare Other

## 2021-12-06 DIAGNOSIS — M25561 Pain in right knee: Secondary | ICD-10-CM | POA: Diagnosis not present

## 2021-12-06 DIAGNOSIS — R2689 Other abnormalities of gait and mobility: Secondary | ICD-10-CM | POA: Diagnosis not present

## 2021-12-06 DIAGNOSIS — M25661 Stiffness of right knee, not elsewhere classified: Secondary | ICD-10-CM | POA: Diagnosis not present

## 2021-12-06 DIAGNOSIS — M6281 Muscle weakness (generalized): Secondary | ICD-10-CM | POA: Diagnosis not present

## 2021-12-06 DIAGNOSIS — Z96651 Presence of right artificial knee joint: Secondary | ICD-10-CM | POA: Diagnosis not present

## 2021-12-09 ENCOUNTER — Other Ambulatory Visit: Payer: Self-pay | Admitting: Oncology

## 2021-12-09 ENCOUNTER — Encounter: Payer: Self-pay | Admitting: Oncology

## 2021-12-09 ENCOUNTER — Inpatient Hospital Stay (INDEPENDENT_AMBULATORY_CARE_PROVIDER_SITE_OTHER): Payer: Medicare Other | Admitting: Oncology

## 2021-12-09 ENCOUNTER — Inpatient Hospital Stay: Payer: Medicare Other | Attending: Oncology

## 2021-12-09 VITALS — BP 185/81 | HR 62 | Temp 97.2°F | Resp 17 | Ht 62.0 in | Wt 175.5 lb

## 2021-12-09 DIAGNOSIS — C787 Secondary malignant neoplasm of liver and intrahepatic bile duct: Secondary | ICD-10-CM | POA: Insufficient documentation

## 2021-12-09 DIAGNOSIS — C50919 Malignant neoplasm of unspecified site of unspecified female breast: Secondary | ICD-10-CM | POA: Diagnosis not present

## 2021-12-09 DIAGNOSIS — C50811 Malignant neoplasm of overlapping sites of right female breast: Secondary | ICD-10-CM

## 2021-12-09 DIAGNOSIS — Z170 Estrogen receptor positive status [ER+]: Secondary | ICD-10-CM | POA: Diagnosis not present

## 2021-12-09 DIAGNOSIS — C7951 Secondary malignant neoplasm of bone: Secondary | ICD-10-CM

## 2021-12-09 DIAGNOSIS — Z5111 Encounter for antineoplastic chemotherapy: Secondary | ICD-10-CM | POA: Insufficient documentation

## 2021-12-09 DIAGNOSIS — Z96651 Presence of right artificial knee joint: Secondary | ICD-10-CM | POA: Diagnosis not present

## 2021-12-09 DIAGNOSIS — R2689 Other abnormalities of gait and mobility: Secondary | ICD-10-CM | POA: Diagnosis not present

## 2021-12-09 DIAGNOSIS — D649 Anemia, unspecified: Secondary | ICD-10-CM | POA: Diagnosis not present

## 2021-12-09 DIAGNOSIS — M25561 Pain in right knee: Secondary | ICD-10-CM | POA: Diagnosis not present

## 2021-12-09 DIAGNOSIS — M6281 Muscle weakness (generalized): Secondary | ICD-10-CM | POA: Diagnosis not present

## 2021-12-09 DIAGNOSIS — M25661 Stiffness of right knee, not elsewhere classified: Secondary | ICD-10-CM | POA: Diagnosis not present

## 2021-12-09 LAB — BASIC METABOLIC PANEL
BUN: 13 (ref 4–21)
CO2: 24 — AB (ref 13–22)
Chloride: 97 — AB (ref 99–108)
Creatinine: 0.7 (ref 0.5–1.1)
Glucose: 93
Potassium: 3.7 mEq/L (ref 3.5–5.1)
Sodium: 133 — AB (ref 137–147)

## 2021-12-09 LAB — CBC AND DIFFERENTIAL
HCT: 33 — AB (ref 36–46)
Hemoglobin: 11.1 — AB (ref 12.0–16.0)
Neutrophils Absolute: 2.31
Platelets: 285 10*3/uL (ref 150–400)
WBC: 3.5

## 2021-12-09 LAB — HEPATIC FUNCTION PANEL
ALT: 16 U/L (ref 7–35)
AST: 25 (ref 13–35)
Alkaline Phosphatase: 64 (ref 25–125)
Bilirubin, Total: 0.5

## 2021-12-09 LAB — CBC: RBC: 3.71 — AB (ref 3.87–5.11)

## 2021-12-09 LAB — COMPREHENSIVE METABOLIC PANEL
Albumin: 4.5 (ref 3.5–5.0)
Calcium: 9.1 (ref 8.7–10.7)

## 2021-12-09 NOTE — Progress Notes (Signed)
+ [place]  [place],  [place]  [address] [phone]  Clinic Day:  12/09/21  Referring physician: Emmaline Kluver, *  ASSESSMENT & PLAN:   Invasive ductal carcinoma of the breast, originally diagnosed in 1997 and treated with right mastectomy. She was treated with high dose chemotherapy followed by stem cell transplantation. She completed 5 years of tamoxifen in November 2002.  She later developed liver metastases, ER and HER2 positive, November 2007. She was treated with Herceptin.  Bone metastases and subcutaneous nodules found in November 2007. Treated with surgery, radiation, and hormonal therapy.   Scalp lesions which were confirmed to be metastatic breast cancer, but HER2 negative in 2015. She was placed on anastrozole in 2015 and changed to letrozole in 2018. At that time she had multiple lytic and sclerotic bone metastases.  She had a metastatic lesion of the right anterior chest in August 2017 confirmed to be adenocarcinoma and treated with excision and radiation.  Mass of the lateral C3 vertebral body with collapse treated with radiation in February 2019. At that time the letrozole was stopped and she was placed on fulvestrant injections.  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 Her doses had to be adjusted and she is now down to 75 mg every other day for 21 days on and 7 days off. She continues the fulvestrant every 28 days as well as denosumab every 28 days. She has been stable on this regimen for over 5 years now. Her white count is mildly low today but neutrophils are normal.  Generalized osteoarthritis which flares on and off..   Hypothyroidism, which has been evaluated with an ultrasound of the thyroid and she is on supplement.  Hard mass of the posterior left knee with induration and swelling of the calf. She has had a negative Doppler for DVT and has no evidence of a  Baker's cyst. This does not appear to be infectious as there is no heat or fluctuance, and did not respond to antibiotics.  I was concerned that this may represent metastatic breast cancer, but the MRI scan really does not support that, and the PET is negative.  This could be edema from her prior radiation but is quite indurated.  The husband is asking about a biopsy and I agree.  The increased induration looks like a panniculitis.  Dermatology has also seen and they are unsure of what has caused this indurated area.   This is a pleasant 76 year old female with widely metastatic bone metastases, but no organ involvement at this time. She remains stable on the current regimen of fulvestrant, Xgeva and palbociclib, so I recommend that we continue this the same.  Her CA 27-29 was slowly increasing and went above the normal range in June to 39.7 but was back down to 36.5 in July and then 27.1 in August. We will continue her current systemic treatment.  The PET scan showed low-level hypermetabolic activity and is felt to be posttraumatic or postinflammatory.  She has stable widespread blastic osseous metastases and no new findings.  She will continue her injections every 4 weeks with fulvestrant and denosumab.  I will see her in 12 weeks with CBC, CMP and CA 27.29 for repeat evaluation.  She and her husband understand and agree with this plan of care. They know to call with any concerns regarding her breast cancer.  I provided 20 minutes of face-to-face time during this this encounter and >  50% was spent counseling as documented under my assessment and plan.    Derwood Kaplan, MD [facility] AT [facility] [address] [place] [address] Dept: [phone] Dept Fax: [phone]   CHIEF COMPLAINT:  CC: Metastatic breast cancer  Current Treatment:  Fulvestrant, Delton See and palbociclib   HISTORY OF PRESENT ILLNESS:  Jade Mooney is a  76 y.o. female who presents as a transfer of care from Dr. Lurline Del for the continued treatment and management of metastatic breast cancer. She was originally diagnosed in 1997, stage III, and underwent right mastectomy. She did receive adjuvant chemotherapy with Taxotere for 4 cycles, and also participated in a [facility] protocol with high-dose chemotherapy, cyclophosphamide/carboplatin/BCNU, followed by stem cell rescue in September 1997. She completed 5 years of tamoxifen in November 2002.  Unfortunately, she developed metastatic liver lesions which were biopsy proven metastatic breast cancer in November 2007. However, the tumor was estrogen receptor and HER2 positive, and so she was treated with Herceptin for a period of time.   In July 2015, she was found to have incidental findings of mixed lytic and sclerotic bony lesions on CT abdomen and pelvis. She underwent biopsy of the right iliac bone and surgical pathology confirmed invasive ductal carcinoma, grade 2, estrogen and progesterone receptor positive. Bone density from May 2015 was normal. She was started on zolendronic acid every 12 weeks in July 2015, but this was discontinued after January 2019. She was also found to have three scalp lesions, biopsied in July 2015 confirming metastatic breast cancer. HER2 negative. She was started on anastrozole in August 2015, and was switched to letrozole in April 2018. However, this was discontinued in February 2019 due to progression. She was started on fulvestrant in February 2019.  She developed a skin lesion of the right anterior chest wall in July 2015, and biopsy revealed adenocarcinoma. This was resected in August 2017, and she received radiation, completed in December 2018. Palbociclib was added in January 2018 after PET imaging revealed 2 new foci at T8 and the manubrium. She received palliative radiation to the cervical spine in February 2019. Palbociclib was held during that time and resumed at 75 mg  daily once completed. Delton See was started in March 2019.     I have reviewed her chart and materials related to her cancer extensively and collaborated history with the patient. Summary of oncologic history is as follows: Oncology History  Breast cancer metastasized to bone ([facility])  10/12/2013 Initial Diagnosis   Breast cancer metastasized to bone ([facility])   02/02/2020 - 11/14/2021 Chemotherapy   Patient is on Treatment Plan : BREAST FULVESTRANT & XGEVA Q28D     Primary malignant neoplasm of breast with metastasis ([facility])  07/12/2015 Initial Diagnosis   Metastatic breast cancer ([facility])   02/02/2020 - 11/14/2021 Chemotherapy   Patient is on Treatment Plan : BREAST FULVESTRANT & XGEVA Q28D     INTERVAL HISTORY:  Amy[facility] transfered her care to [facility] in late 2022. PET imaging from December 1st 2022 revealed no FDG-avid soft tissue metastasis; diffuse sclerotic osseous metastasis, without FDG affinity; similar left chest wall or medial left breast subcutaneous 5 mm nodule with low-level hypermetabolism, favored benign and incidental. She continues palbociclib 75 mg every other day for 21 days on and 7 days off without difficulty. She continues fulvestrant every 28 days since February 2019 and Xgeva without difficulty as well. She does complain of chronic continuous pain of the right knee that she  rates as a 5/10, and had her total knee replacement 6 weeks ago  She has had a swelling of her posterior left leg at the popliteal fossa with induration inferior to that of the calf. She did have a Doppler which was negative for deep venous thrombosis. This is mildly painful and an MRI was very nonspecific with just skin thickening and subcutaneous edema at the posterior medial aspect of the proximal lower leg at the site of the swelling. She previously had a bone lesion within the left fibular metadiaphysis which was radiated and now demonstrates predominantly fatty marrow signal consistent with fatty replacement of a  treated osseous metastatic lesion.  I thought perhaps this could be postradiation effects but it seems to be worsening.  A PET scan was negative for metastatic disease.  This still showed her widespread sclerotic bone metastases but stable.  Her white count was 2.8 with an ANC of 1.54, just mildly lower than previous. The rest of her CBC and CMP are unremarkable and her We held her palbociclib before and after her recent surgery, and she notes some nausea since resuming it. She goes to PT and takes Dilaudid prior to those appointments bbut otherwise is not requiring pain medication. Her calcium is good at 9.1. Her sodium is a little better at 133. Her WBC's are mildly low at 3.5 but with a normal ANC. Her hemoglobin has increased from 9.9 to 11.1. Her  appetite is good, and she has gained 3 pounds.  She denies fever, chills or other signs of infection.  She denies nausea, vomiting, bowel issues, or abdominal pain.  She denies sore throat, cough, dyspnea, or chest pain.  HISTORY:   Past Medical History:  Diagnosis Date   Anxiety    Cancer [facility])    Breast 1997 right tx with mastectomy and chemo, metastatic now   GERD (gastroesophageal reflux disease)    Hypercholesterolemia    Hypertension    Hypothyroidism    Osteoarthritis    oa   Personal history of chemotherapy    Personal history of radiation therapy    Secondary malignant neoplasm of bone and bone marrow ([facility])    TIA (transient ischemic attack) last 09/10/20   x 3 total, they seem to occur every 5 years    Past Surgical History:  Procedure Laterality Date   CATARACT EXTRACTION, BILATERAL     CERVICAL FUSION  2020   MASTECTOMY MODIFIED RADICAL Right 1997   porta cath insertion  1997   later removed   radiation tx  finished 02-25-16   x 20 tx   TONSILLECTOMY     TOTAL HIP ARTHROPLASTY Left 03/12/2016   Procedure: LEFT TOTAL HIP ARTHROPLASTY ANTERIOR APPROACH;  Surgeon: Gaynelle Arabian, MD;  Location: [facility];  Service: Orthopedics;   Laterality: Left;   TOTAL KNEE ARTHROPLASTY Left    TOTAL KNEE ARTHROPLASTY Right 10/30/2021   Procedure: TOTAL KNEE ARTHROPLASTY;  Surgeon: Sydnee Cabal, MD;  Location: [facility];  Service: Orthopedics;  Laterality: Right;  adductor canal 120   TUBAL LIGATION      Family History  Problem Relation Age of Onset   Breast cancer Maternal Aunt     Social History:  reports that she has never smoked. She has never used smokeless tobacco. She reports current alcohol use. She reports that she does not use drugs.The patient is accompanied by her husband [name] today. She is married and lives at home with her spouse. They are planning a cruise. She is retired  from work, and has never been exposed to chemicals or other toxic agents.  Allergies:  Allergies  Allergen Reactions   Amoxicillin Hives    Has patient had a PCN reaction causing immediate rash, facial/tongue/throat swelling, SOB or lightheadedness with hypotension:unsure Has patient had a PCN reaction causing severe rash involving mucus membranes or skin necrosis:No Has patient had a PCN reaction that required hospitalization:No Has patient had a PCN reaction occurring within the last 10 years: Yes If all of the above answers are "NO", then may proceed with Cephalosporin use.    Erythromycin Other (See Comments)   Oxycodone Hcl Nausea And Vomiting   Percocet [Oxycodone-Acetaminophen] Nausea And Vomiting   Percodan [Oxycodone-Aspirin] Nausea And Vomiting   Tramadol Itching    Current Medications: Current Outpatient Medications  Medication Sig Dispense Refill   melatonin 5 MG TABS Take 5 mg by mouth at bedtime as needed.     acetaminophen (TYLENOL) 500 MG tablet Take 1,000 mg by mouth every 6 (six) hours as needed for mild pain.     acidophilus (RISAQUAD) CAPS capsule Take 1 capsule by mouth daily.     ascorbic acid (VITAMIN C) 1000 MG tablet Take 1,000 mg by mouth daily.     aspirin 325 MG tablet Take 325 mg by mouth daily.     beta  carotene 15 MG capsule Take 15 mg by mouth daily.     Biotin 5000 MCG TABS Take 5,000 mcg by mouth daily.     calcium citrate-vitamin D (CITRACAL+D) 315-200 MG-UNIT tablet Take 1 tablet by mouth in the morning and at bedtime.     cholecalciferol (VITAMIN D) 1000 units tablet Take 1,000 Units by mouth daily.     clopidogrel (PLAVIX) 75 MG tablet Take 1 tablet (75 mg total) by mouth daily.     cyanocobalamin (,VITAMIN B-12,) 1000 MCG/ML injection Inject 1,000 mcg into the muscle every 30 (thirty) days.     Denosumab (XGEVA [place]) Inject 1 Dose into the skin every 30 (thirty) days.     famotidine (PEPCID) 40 MG tablet Take 40 mg by mouth daily.     gabapentin (NEURONTIN) 300 MG capsule Take 1 capsule (300 mg total) by mouth at bedtime for 21 days. 21 capsule 0   glucosamine-chondroitin 500-400 MG tablet Take 1 tablet by mouth daily.     HYDROmorphone (DILAUDID) 2 MG tablet TAKE 1 TABLET (2 MG TOTAL) BY MOUTH EVERY 4 (FOUR) HOURS AS NEEDED FOR UP TO 7 DAYS FOR SEVERE PAIN.     ketotifen (ZADITOR) 0.025 % ophthalmic solution Place 1 drop into both eyes daily as needed (allergies).     levothyroxine (SYNTHROID, LEVOTHROID) 88 MCG tablet Take 88 mcg by mouth daily before breakfast.     LORazepam (ATIVAN) 1 MG tablet Take 1 tablet (1 mg total) by mouth every 8 (eight) hours as needed for anxiety. 30 tablet 0   Lutein 20 MG CAPS Take 20 mg by mouth daily.     Magnesium (RA NATURAL MAGNESIUM) 250 MG TABS Take 250 mg by mouth daily.     meclizine (ANTIVERT) 25 MG tablet Take 25 mg by mouth 3 (three) times daily as needed for dizziness.     metroNIDAZOLE (METROCREAM) 0.75 % cream Apply 1 Application topically daily.     Multiple Vitamins-Minerals (MULTIVITAMIN WITH MINERALS) tablet Take 1 tablet by mouth daily.     Omega-3 1000 MG CAPS Take 1,000 mg by mouth in the morning and at bedtime.     palbociclib [facility])  75 MG capsule Take 1 capsule (75 mg total) by mouth daily with breakfast. Take whole with food on  days 1-21, every 28 days. (Patient taking differently: Take 75 mg by mouth See admin instructions. Take 75 mg by mouth every other day with food on days 1-21 with 7 days off, every 28 days.) 21 capsule 6   pantoprazole (PROTONIX) 40 MG tablet Take 40 mg by mouth 2 (two) times daily. Before breakfast and before supper     Polyethyl Glycol-Propyl Glycol 0.4-0.3 % SOLN Place 1-2 drops into both eyes 2 (two) times daily.     rOPINIRole (REQUIP) 2 MG tablet Take 4 mg by mouth at bedtime.     rosuvastatin (CRESTOR) 20 MG tablet Take 1 tablet (20 mg total) by mouth daily.     vitamin E 180 MG (400 UNITS) capsule Take 400 Units by mouth daily.     zinc gluconate 50 MG tablet Take 50 mg by mouth daily.     No current facility-administered medications for this visit.    REVIEW OF SYSTEMS:  Review of Systems  Constitutional: Negative.  Negative for appetite change, chills, fatigue, fever and unexpected weight change.  HENT:  Negative.    Eyes: Negative.   Respiratory: Negative.  Negative for chest tightness, cough, hemoptysis, shortness of breath and wheezing.   Cardiovascular:  Positive for leg swelling (of the left medial leg near the popliteal fossa). Negative for chest pain and palpitations.       The posterior popliteal fossa has an increasing area of induration which is nontender with no obvious inflammation and looks similar to panniculitis to me.  She now also has a small nodule in the posterior leg just above the popliteal fossa.  Gastrointestinal: Negative.  Negative for abdominal distention, abdominal pain, blood in stool, constipation, diarrhea, nausea and vomiting.  Endocrine: Negative.   Genitourinary: Negative.  Negative for difficulty urinating, dysuria, frequency and hematuria.   Musculoskeletal:  Positive for arthralgias (diffuse). Negative for back pain, flank pain, gait problem and myalgias.  Skin: Negative.   Neurological: Negative.  Negative for dizziness, extremity weakness, gait  problem, headaches, light-headedness, numbness, seizures and speech difficulty.  Hematological: Negative.   Psychiatric/Behavioral: Negative.  Negative for depression and sleep disturbance. The patient is not nervous/anxious.       VITALS:  Blood pressure (!) 185/81, pulse 62, temperature (!) 97.2 F (36.2 C), temperature source Oral, resp. rate 17, height $RemoveBe'5\' 2"'QGGbFMeBa$  (1.575 m), weight 175 lb 8 oz (79.6 kg), SpO2 99 %.  Wt Readings from Last 3 Encounters:  12/12/21 174 lb 4 oz (79 kg)  12/09/21 175 lb 8 oz (79.6 kg)  11/14/21 178 lb (80.7 kg)    Body mass index is 32.1 kg/m.  Performance status (ECOG): 1 - Symptomatic but completely ambulatory  PHYSICAL EXAM:  Physical Exam Constitutional:      General: She is not in acute distress.    Appearance: Normal appearance. She is normal weight.  HENT:     Head: Normocephalic and atraumatic.  Eyes:     General: No scleral icterus.    Extraocular Movements: Extraocular movements intact.     Conjunctiva/sclera: Conjunctivae normal.     Pupils: Pupils are equal, round, and reactive to light.  Cardiovascular:     Rate and Rhythm: Normal rate and regular rhythm.     Pulses: Normal pulses.     Heart sounds: Normal heart sounds. No murmur heard.    No friction rub. No gallop.  Pulmonary:  Effort: Pulmonary effort is normal. No respiratory distress.     Breath sounds: Normal breath sounds.  Chest:     Comments: Right mastectomy is negative and left breast is without masses. Abdominal:     General: Bowel sounds are normal. There is no distension.     Palpations: Abdomen is soft. There is no hepatomegaly, splenomegaly or mass.     Tenderness: There is no abdominal tenderness.  Musculoskeletal:        General: Normal range of motion.     Cervical back: Normal range of motion and neck supple.     Right lower leg: No edema.     Left lower leg: No edema.     Comments: Large nodules of her PIP joints of the hands consistent with advanced  osteoarthritis. Left leg has induration of the calf with a mass effect of the lower popliteal fossa. This is not hot, erythematous or fluctuant.  She now has a small 1 cm nodule in the posterior left leg above the popliteal fossa  Lymphadenopathy:     Cervical: No cervical adenopathy.  Skin:    General: Skin is warm and dry.  Neurological:     General: No focal deficit present.     Mental Status: She is alert and oriented to person, place, and time. Mental status is at baseline.  Psychiatric:        Mood and Affect: Mood normal.        Behavior: Behavior normal.        Thought Content: Thought content normal.        Judgment: Judgment normal.      LABS:      Latest Ref Rng & Units 12/09/2021   12:00 AM 11/12/2021   12:00 AM 10/30/2021    3:31 PM  CBC  WBC  3.5     9.0     4.2   Hemoglobin 12.0 - 16.0 11.1     9.9     13.2   Hematocrit 36 - 46 33     29     40.7   Platelets 150 - 400 K/uL 285     433     143      This result is from an external source.      Latest Ref Rng & Units 12/09/2021   12:00 AM 11/12/2021   12:00 AM 10/30/2021    3:31 PM  CMP  Glucose 70 - 99 mg/dL   127   BUN 4 - $R'21 13     13     11   'xh$ Creatinine 0.5 - 1.1 0.7     0.7     0.81   Sodium 137 - 147 133     130     141   Potassium 3.5 - 5.1 mEq/L 3.7     4.5     4.1   Chloride 99 - 108 97     97     107   CO2 13 - $Re'22 24     25     24   'TCy$ Calcium 8.7 - 10.7 9.1     9.4     9.0   Total Protein 6.5 - 8.1 g/dL   7.3   Total Bilirubin 0.3 - 1.2 mg/dL   0.5   Alkaline Phos 25 - 125 64     61     48   AST 13 - 35 25     37  29   ALT 7 - 35 U/L $Remo'16     25     21      'PiYgj$ This result is from an external source.     Lab Results  Component Value Date   LDH 138 01/17/2011   LDH 155 12/28/2009   LDH 155 12/29/2008    STUDIES:    NUCLEAR MEDICINE PET WHOLE BODY 06/25/21 TECHNIQUE:  14.7 mCi F-18 FDG was injected intravenously. Full-ring PET imaging  was performed from the head to foot after the radiotracer.  CT data  was obtained and used for attenuation correction and anatomic  localization.  Fasting blood glucose: NA mg/dl  COMPARISON: PET-CT 02/21/2021. Lower leg MRI 03/29/2021.  FINDINGS:  Mediastinal blood pool activity: SUV max 2.7  HEAD/ NECK:  No hypermetabolic cervical lymph nodes are identified.There are no  lesions of the pharyngeal mucosal space. Mild diffuse thyroid  activity, improved from previous study and likely physiologic.  Incidental CT findings: none  CHEST:  There are no hypermetabolic mediastinal, hilar, axillary or internal  mammary lymph nodes. Small partially calcified subcutaneous nodule  medially in the left breast is again noted with low level  hypermetabolic activity, unchanged from the previous study. No  suspicious breast activity. There is also low level hypermetabolic  activity (SUV max 2.4) associated with a partially calcified lower  right paraspinal nodule (image 155/3), similar to previous study and  probably an osteophyte. No hypermetabolic pulmonary activity or  suspicious nodularity.  Incidental CT findings: Scattered pulmonary scarring bilaterally.  Mild aortic and coronary artery atherosclerosis. Previous right  mastectomy.  ABDOMEN/PELVIS:  There is no hypermetabolic activity within the liver, adrenal  glands, spleen or pancreas. There is no hypermetabolic nodal  activity.  Incidental CT findings: Ill-defined low-density hepatic lesions are  grossly stable, without hypermetabolic activity. Small gallstones  are present. There are diverticular changes throughout the colon and  mild aortic atherosclerosis.  SKELETON:  There is no hypermetabolic activity to suggest metabolically active  recurrent metastatic disease. Widespread blastic metastatic disease  appears grossly stable.  Incidental CT findings: As above, chronic widespread osseous  metastatic disease which appears grossly unchanged. Previous  cervical fusion and left total hip  arthroplasty.  EXTREMITIES: There is no hypermetabolic activity within the bones to  suggest recurrent viable metastatic disease. Widespread treated  metastases are grossly stable. There are subcutaneous calcifications  posteromedially in the proximal left lower leg as seen on previous  MRI. These demonstrate low-level metabolic activity (SUV max 2.5).  No suspicious soft tissue masses.  Incidental CT findings: Status post left total knee arthroplasty.  Scattered vascular calcifications.  IMPRESSION:  1. No evidence of local recurrent breast cancer or viable distant  metastatic disease.  2. Grossly stable widespread blastic osseous metastatic disease,  without hypermetabolic activity consistent with chronic treated  disease.  3. As seen on previous lower leg MRI, there is subcutaneous soft  tissue thickening and calcification posteromedially in the proximal  left lower leg with low level hypermetabolic activity, likely  posttraumatic or postinflammatory.  4. Incidental findings including cholelithiasis, colonic  diverticulosis, coronary and Aortic Atherosclerosis (ICD10-I70.0).  Electronically Signed  By: Richardean Sale M.D.  On: 06/26/2021 10:45       EXAM: 03/29/21 MRI OF THE LEFT TIBIA AND FIBULA WITHOUT AND WITH CONTRAST  TECHNIQUE:  Multiplanar, multisequence MR imaging of the left tibia and fibula  was performed both before and after administration of intravenous  contrast.  CONTRAST: 7.5 mL Gadavist IV contrast  COMPARISON:  MRI 10/30/2014  FINDINGS:  Bones/Joint/Cartilage  Metallic susceptibility artifact related to patient's left knee  arthroplasty hardware degrades evaluation of the adjacent bony and  soft tissue structures the proximal aspect of the field of view.  This also results in regionally poor fat saturation which  unfortunately involves the area of clinical concern as demarcated by  a skin marker (series 14, image 8).  On the previous MRI of 10/30/2014, a  bone lesion was visualized  within the left fibular metadiaphysis demonstrating low T1 marrow  signal. This area demonstrates predominantly fatty T1 hyperintense  marrow signal on the current study (series 4, image 10), which may  represent fatty replacement of a treated osseous metastatic lesion.  Remaining osseous structures demonstrate no evidence of a marrow  replacing bone lesion. No bone marrow edema. No evidence of fracture  or malalignment within the limitations of this exam.  Ligaments  Not characterized at the edge of the field of view.  Muscles and Tendons  Normal muscle bulk and signal intensity without edema, atrophy, or  fatty infiltration. Included tendinous structures appear intact and  unremarkable.  Soft tissues  Skin thickening and subcutaneous edema is seen at the posteromedial  aspect of the proximal lower leg at site of patient's clinical  concern. No organized fluid collection. No discernible solid or  cystic mass at this location. Evaluation for enhancement within the  tissues is unable to be assessed secondary to previously described  artifact. Relatively mild circumferential subcutaneous edema  throughout the remainder of the lower leg. Elsewhere, there is no  focal fluid collection or enhancing mass.   IMPRESSION:  1. Skin thickening and subcutaneous edema at the posteromedial  aspect of the proximal lower leg at site of patient's clinical  concern, nonspecific. Cellulitis could have this appearance in the  appropriate clinical setting. No fluid collection or discernible  mass is evident at this location. Evaluation of this location is  somewhat degraded by artifact, as discussed above.  2. On the previous MRI of 10/30/2014, a bone lesion was visualized  within the left fibular metadiaphysis demonstrating low T1 marrow  signal. 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 Remaining osseous structures  demonstrate no evidence of a marrow replacing bone lesion.   Electronically Signed  By: Davina Poke D.O.  On: 03/29/2021 10:45

## 2021-12-10 LAB — CANCER ANTIGEN 27.29: CA 27.29: 31.4 U/mL (ref 0.0–38.6)

## 2021-12-11 DIAGNOSIS — M6281 Muscle weakness (generalized): Secondary | ICD-10-CM | POA: Diagnosis not present

## 2021-12-11 DIAGNOSIS — Z96651 Presence of right artificial knee joint: Secondary | ICD-10-CM | POA: Diagnosis not present

## 2021-12-11 DIAGNOSIS — M25561 Pain in right knee: Secondary | ICD-10-CM | POA: Diagnosis not present

## 2021-12-11 DIAGNOSIS — M25661 Stiffness of right knee, not elsewhere classified: Secondary | ICD-10-CM | POA: Diagnosis not present

## 2021-12-11 DIAGNOSIS — R2689 Other abnormalities of gait and mobility: Secondary | ICD-10-CM | POA: Diagnosis not present

## 2021-12-12 ENCOUNTER — Inpatient Hospital Stay: Payer: Medicare Other

## 2021-12-12 VITALS — BP 137/79 | HR 79 | Temp 98.0°F | Resp 18 | Ht 62.0 in | Wt 174.2 lb

## 2021-12-12 DIAGNOSIS — C50919 Malignant neoplasm of unspecified site of unspecified female breast: Secondary | ICD-10-CM

## 2021-12-12 DIAGNOSIS — Z5111 Encounter for antineoplastic chemotherapy: Secondary | ICD-10-CM | POA: Diagnosis not present

## 2021-12-12 DIAGNOSIS — C787 Secondary malignant neoplasm of liver and intrahepatic bile duct: Secondary | ICD-10-CM | POA: Diagnosis not present

## 2021-12-12 DIAGNOSIS — Z170 Estrogen receptor positive status [ER+]: Secondary | ICD-10-CM | POA: Diagnosis not present

## 2021-12-12 DIAGNOSIS — C50912 Malignant neoplasm of unspecified site of left female breast: Secondary | ICD-10-CM

## 2021-12-12 DIAGNOSIS — C7951 Secondary malignant neoplasm of bone: Secondary | ICD-10-CM | POA: Diagnosis not present

## 2021-12-12 MED ORDER — FULVESTRANT 250 MG/5ML IM SOSY
500.0000 mg | PREFILLED_SYRINGE | Freq: Once | INTRAMUSCULAR | Status: AC
  Administered 2021-12-12: 500 mg via INTRAMUSCULAR
  Filled 2021-12-12: qty 10

## 2021-12-12 MED ORDER — DENOSUMAB 120 MG/1.7ML ~~LOC~~ SOLN
120.0000 mg | Freq: Once | SUBCUTANEOUS | Status: AC
  Administered 2021-12-12: 120 mg via SUBCUTANEOUS
  Filled 2021-12-12: qty 1.7

## 2021-12-12 NOTE — Patient Instructions (Signed)
Fulvestrant Injection What is this medication? FULVESTRANT (ful VES trant) treats breast cancer. It works by blocking the hormone estrogen in breast tissue, which prevents breast cancer cells from spreading or growing. This medicine may be used for other purposes; ask your health care provider or pharmacist if you have questions. COMMON BRAND NAME(S): FASLODEX What should I tell my care team before I take this medication? They need to know if you have any of these conditions: Bleeding disorder Liver disease Low blood cell levels, such as low white cells, red cells, and platelets An unusual or allergic reaction to fulvestrant, other medications, foods, dyes, or preservatives Pregnant or trying to get pregnant Breast-feeding How should I use this medication? This medication is injected into a muscle. It is given by your care team in a hospital or clinic setting. Talk to your care team about the use of this medication in children. Special care may be needed. Overdosage: If you think you have taken too much of this medicine contact a poison control center or emergency room at once. NOTE: This medicine is only for you. Do not share this medicine with others. What if I miss a dose? Keep appointments for follow-up doses. It is important not to miss your dose. Call your care team if you are unable to keep an appointment. What may interact with this medication? Certain medications that prevent or treat blood clots, such as warfarin, enoxaparin, dalteparin, apixaban, dabigatran, rivaroxaban This list may not describe all possible interactions. Give your health care provider a list of all the medicines, herbs, non-prescription drugs, or dietary supplements you use. Also tell them if you smoke, drink alcohol, or use illegal drugs. Some items may interact with your medicine. What should I watch for while using this medication? Your condition will be monitored carefully while you are receiving this  medication. You may need blood work while taking this medication. Talk to your care team if you may be pregnant. Serious birth defects can occur if you take this medication during pregnancy and for 1 year after the last dose. You will need a negative pregnancy test before starting this medication. Contraception is recommended while taking this medication and for 1 year after the last dose. Your care team can help you find the option that works for you. Do not breastfeed while taking this medication and for 1 year after the last dose. This medication may cause infertility. Talk to your care team if you are concerned about your fertility. What side effects may I notice from receiving this medication? Side effects that you should report to your care team as soon as possible: Allergic reactions or angioedema--skin rash, itching or hives, swelling of the face, eyes, lips, tongue, arms, or legs, trouble swallowing or breathing Pain, tingling, or numbness in the hands or feet Side effects that usually do not require medical attention (report to your care team if they continue or are bothersome): Bone, joint, or muscle pain Constipation Headache Hot flashes Nausea Pain, redness, or irritation at injection site Unusual weakness or fatigue This list may not describe all possible side effects. Call your doctor for medical advice about side effects. You may report side effects to FDA at 1-800-FDA-1088. Where should I keep my medication? This medication is given in a hospital or clinic. It will not be stored at home. NOTE: This sheet is a summary. It may not cover all possible information. If you have questions about this medicine, talk to your doctor, pharmacist, or health care provider.    2023 Elsevier/Gold Standard (2021-07-22 00:00:00)  Denosumab Injection (Oncology) What is this medication? DENOSUMAB (den oh SUE mab) prevents weakened bones caused by cancer. It may also be used to treat noncancerous  bone tumors that cannot be removed by surgery. It can also be used to treat high calcium levels in the blood caused by cancer. It works by blocking a protein that causes bones to break down quickly. This slows down the release of calcium from bones, which lowers calcium levels in your blood. It also makes your bones stronger and less likely to break (fracture). This medicine may be used for other purposes; ask your health care provider or pharmacist if you have questions. COMMON BRAND NAME(S): XGEVA What should I tell my care team before I take this medication? They need to know if you have any of these conditions: Dental disease Having surgery or tooth extraction Infection Kidney disease Low levels of calcium or vitamin D in the blood Malnutrition On hemodialysis Skin conditions or sensitivity Thyroid or parathyroid disease An unusual reaction to denosumab, other medications, foods, dyes, or preservatives Pregnant or trying to get pregnant Breast-feeding How should I use this medication? This medication is for injection under the skin. It is given by your care team in a hospital or clinic setting. A special MedGuide will be given to you before each treatment. Be sure to read this information carefully each time. Talk to your care team about the use of this medication in children. While it may be prescribed for children as young as 13 years for selected conditions, precautions do apply. Overdosage: If you think you have taken too much of this medicine contact a poison control center or emergency room at once. NOTE: This medicine is only for you. Do not share this medicine with others. What if I miss a dose? Keep appointments for follow-up doses. It is important not to miss your dose. Call your care team if you are unable to keep an appointment. What may interact with this medication? Do not take this medication with any of the following: Other medications containing denosumab This  medication may also interact with the following: Medications that lower your chance of fighting infection Steroid medications, such as prednisone or cortisone This list may not describe all possible interactions. Give your health care provider a list of all the medicines, herbs, non-prescription drugs, or dietary supplements you use. Also tell them if you smoke, drink alcohol, or use illegal drugs. Some items may interact with your medicine. What should I watch for while using this medication? Your condition will be monitored carefully while you are receiving this medication. You may need blood work while taking this medication. This medication may increase your risk of getting an infection. Call your care team for advice if you get a fever, chills, sore throat, or other symptoms of a cold or flu. Do not treat yourself. Try to avoid being around people who are sick. You should make sure you get enough calcium and vitamin D while you are taking this medication, unless your care team tells you not to. Discuss the foods you eat and the vitamins you take with your care team. Some people who take this medication have severe bone, joint, or muscle pain. This medication may also increase your risk for jaw problems or a broken thigh bone. Tell your care team right away if you have severe pain in your jaw, bones, joints, or muscles. Tell your care team if you have any pain that does not   go away or that gets worse. Talk to your care team if you may be pregnant. Serious birth defects can occur if you take this medication during pregnancy and for 5 months after the last dose. You will need a negative pregnancy test before starting this medication. Contraception is recommended while taking this medication and for 5 months after the last dose. Your care team can help you find the option that works for you. What side effects may I notice from receiving this medication? Side effects that you should report to your care  team as soon as possible: Allergic reactions--skin rash, itching, hives, swelling of the face, lips, tongue, or throat Bone, joint, or muscle pain Low calcium level--muscle pain or cramps, confusion, tingling, or numbness in the hands or feet Osteonecrosis of the jaw--pain, swelling, or redness in the mouth, numbness of the jaw, poor healing after dental work, unusual discharge from the mouth, visible bones in the mouth Side effects that usually do not require medical attention (report to your care team if they continue or are bothersome): Cough Diarrhea Fatigue Headache Nausea This list may not describe all possible side effects. Call your doctor for medical advice about side effects. You may report side effects to FDA at 1-800-FDA-1088. Where should I keep my medication? This medication is given in a hospital or clinic. It will not be stored at home. NOTE: This sheet is a summary. It may not cover all possible information. If you have questions about this medicine, talk to your doctor, pharmacist, or health care provider.  2023 Elsevier/Gold Standard (2021-07-29 00:00:00)  

## 2021-12-13 DIAGNOSIS — M25561 Pain in right knee: Secondary | ICD-10-CM | POA: Diagnosis not present

## 2021-12-13 DIAGNOSIS — R2689 Other abnormalities of gait and mobility: Secondary | ICD-10-CM | POA: Diagnosis not present

## 2021-12-13 DIAGNOSIS — Z96651 Presence of right artificial knee joint: Secondary | ICD-10-CM | POA: Diagnosis not present

## 2021-12-13 DIAGNOSIS — M25661 Stiffness of right knee, not elsewhere classified: Secondary | ICD-10-CM | POA: Diagnosis not present

## 2021-12-13 DIAGNOSIS — M6281 Muscle weakness (generalized): Secondary | ICD-10-CM | POA: Diagnosis not present

## 2021-12-16 DIAGNOSIS — M25661 Stiffness of right knee, not elsewhere classified: Secondary | ICD-10-CM | POA: Diagnosis not present

## 2021-12-16 DIAGNOSIS — R2689 Other abnormalities of gait and mobility: Secondary | ICD-10-CM | POA: Diagnosis not present

## 2021-12-16 DIAGNOSIS — Z96651 Presence of right artificial knee joint: Secondary | ICD-10-CM | POA: Diagnosis not present

## 2021-12-16 DIAGNOSIS — M25561 Pain in right knee: Secondary | ICD-10-CM | POA: Diagnosis not present

## 2021-12-16 DIAGNOSIS — M6281 Muscle weakness (generalized): Secondary | ICD-10-CM | POA: Diagnosis not present

## 2021-12-18 DIAGNOSIS — M25661 Stiffness of right knee, not elsewhere classified: Secondary | ICD-10-CM | POA: Diagnosis not present

## 2021-12-18 DIAGNOSIS — Z96651 Presence of right artificial knee joint: Secondary | ICD-10-CM | POA: Diagnosis not present

## 2021-12-18 DIAGNOSIS — M25561 Pain in right knee: Secondary | ICD-10-CM | POA: Diagnosis not present

## 2021-12-18 DIAGNOSIS — M6281 Muscle weakness (generalized): Secondary | ICD-10-CM | POA: Diagnosis not present

## 2021-12-18 DIAGNOSIS — R2689 Other abnormalities of gait and mobility: Secondary | ICD-10-CM | POA: Diagnosis not present

## 2021-12-19 DIAGNOSIS — Z96651 Presence of right artificial knee joint: Secondary | ICD-10-CM | POA: Diagnosis not present

## 2021-12-19 DIAGNOSIS — M25561 Pain in right knee: Secondary | ICD-10-CM | POA: Diagnosis not present

## 2021-12-19 DIAGNOSIS — R2689 Other abnormalities of gait and mobility: Secondary | ICD-10-CM | POA: Diagnosis not present

## 2021-12-19 DIAGNOSIS — M6281 Muscle weakness (generalized): Secondary | ICD-10-CM | POA: Diagnosis not present

## 2021-12-19 DIAGNOSIS — M25661 Stiffness of right knee, not elsewhere classified: Secondary | ICD-10-CM | POA: Diagnosis not present

## 2021-12-24 DIAGNOSIS — M25661 Stiffness of right knee, not elsewhere classified: Secondary | ICD-10-CM | POA: Diagnosis not present

## 2021-12-24 DIAGNOSIS — M25561 Pain in right knee: Secondary | ICD-10-CM | POA: Diagnosis not present

## 2021-12-24 DIAGNOSIS — R2689 Other abnormalities of gait and mobility: Secondary | ICD-10-CM | POA: Diagnosis not present

## 2021-12-24 DIAGNOSIS — Z96651 Presence of right artificial knee joint: Secondary | ICD-10-CM | POA: Diagnosis not present

## 2021-12-24 DIAGNOSIS — M6281 Muscle weakness (generalized): Secondary | ICD-10-CM | POA: Diagnosis not present

## 2021-12-26 DIAGNOSIS — M6281 Muscle weakness (generalized): Secondary | ICD-10-CM | POA: Diagnosis not present

## 2021-12-26 DIAGNOSIS — M25561 Pain in right knee: Secondary | ICD-10-CM | POA: Diagnosis not present

## 2021-12-26 DIAGNOSIS — R2689 Other abnormalities of gait and mobility: Secondary | ICD-10-CM | POA: Diagnosis not present

## 2021-12-26 DIAGNOSIS — M25661 Stiffness of right knee, not elsewhere classified: Secondary | ICD-10-CM | POA: Diagnosis not present

## 2021-12-26 DIAGNOSIS — Z96651 Presence of right artificial knee joint: Secondary | ICD-10-CM | POA: Diagnosis not present

## 2021-12-27 ENCOUNTER — Encounter: Payer: Self-pay | Admitting: Oncology

## 2021-12-27 DIAGNOSIS — Z23 Encounter for immunization: Secondary | ICD-10-CM | POA: Diagnosis not present

## 2021-12-30 DIAGNOSIS — E538 Deficiency of other specified B group vitamins: Secondary | ICD-10-CM | POA: Diagnosis not present

## 2021-12-30 DIAGNOSIS — R2689 Other abnormalities of gait and mobility: Secondary | ICD-10-CM | POA: Diagnosis not present

## 2021-12-30 DIAGNOSIS — M25661 Stiffness of right knee, not elsewhere classified: Secondary | ICD-10-CM | POA: Diagnosis not present

## 2021-12-30 DIAGNOSIS — Z96651 Presence of right artificial knee joint: Secondary | ICD-10-CM | POA: Diagnosis not present

## 2021-12-30 DIAGNOSIS — M25561 Pain in right knee: Secondary | ICD-10-CM | POA: Diagnosis not present

## 2021-12-30 DIAGNOSIS — M6281 Muscle weakness (generalized): Secondary | ICD-10-CM | POA: Diagnosis not present

## 2022-01-02 DIAGNOSIS — Z96651 Presence of right artificial knee joint: Secondary | ICD-10-CM | POA: Diagnosis not present

## 2022-01-02 DIAGNOSIS — M6281 Muscle weakness (generalized): Secondary | ICD-10-CM | POA: Diagnosis not present

## 2022-01-02 DIAGNOSIS — M25561 Pain in right knee: Secondary | ICD-10-CM | POA: Diagnosis not present

## 2022-01-02 DIAGNOSIS — R2689 Other abnormalities of gait and mobility: Secondary | ICD-10-CM | POA: Diagnosis not present

## 2022-01-02 DIAGNOSIS — M25661 Stiffness of right knee, not elsewhere classified: Secondary | ICD-10-CM | POA: Diagnosis not present

## 2022-01-06 DIAGNOSIS — Z96651 Presence of right artificial knee joint: Secondary | ICD-10-CM | POA: Diagnosis not present

## 2022-01-06 DIAGNOSIS — M25561 Pain in right knee: Secondary | ICD-10-CM | POA: Diagnosis not present

## 2022-01-06 DIAGNOSIS — M6281 Muscle weakness (generalized): Secondary | ICD-10-CM | POA: Diagnosis not present

## 2022-01-06 DIAGNOSIS — R2689 Other abnormalities of gait and mobility: Secondary | ICD-10-CM | POA: Diagnosis not present

## 2022-01-06 DIAGNOSIS — M25661 Stiffness of right knee, not elsewhere classified: Secondary | ICD-10-CM | POA: Diagnosis not present

## 2022-01-07 ENCOUNTER — Inpatient Hospital Stay: Payer: Medicare Other | Attending: Oncology

## 2022-01-07 DIAGNOSIS — Z5111 Encounter for antineoplastic chemotherapy: Secondary | ICD-10-CM | POA: Diagnosis not present

## 2022-01-07 DIAGNOSIS — Z170 Estrogen receptor positive status [ER+]: Secondary | ICD-10-CM | POA: Diagnosis not present

## 2022-01-07 DIAGNOSIS — C787 Secondary malignant neoplasm of liver and intrahepatic bile duct: Secondary | ICD-10-CM | POA: Diagnosis not present

## 2022-01-07 DIAGNOSIS — C7951 Secondary malignant neoplasm of bone: Secondary | ICD-10-CM | POA: Insufficient documentation

## 2022-01-07 DIAGNOSIS — C50919 Malignant neoplasm of unspecified site of unspecified female breast: Secondary | ICD-10-CM | POA: Diagnosis not present

## 2022-01-07 DIAGNOSIS — D649 Anemia, unspecified: Secondary | ICD-10-CM | POA: Diagnosis not present

## 2022-01-07 LAB — BASIC METABOLIC PANEL
BUN: 15 (ref 4–21)
CO2: 23 — AB (ref 13–22)
Chloride: 101 (ref 99–108)
Creatinine: 0.8 (ref 0.5–1.1)
Glucose: 145
Potassium: 4.2 mEq/L (ref 3.5–5.1)
Sodium: 130 — AB (ref 137–147)

## 2022-01-07 LAB — CBC AND DIFFERENTIAL
HCT: 34 — AB (ref 36–46)
Hemoglobin: 11.6 — AB (ref 12.0–16.0)
Neutrophils Absolute: 1.74
Platelets: 264 10*3/uL (ref 150–400)
WBC: 3

## 2022-01-07 LAB — COMPREHENSIVE METABOLIC PANEL
Albumin: 4.4 (ref 3.5–5.0)
Calcium: 9.1 (ref 8.7–10.7)

## 2022-01-07 LAB — CBC: RBC: 3.85 — AB (ref 3.87–5.11)

## 2022-01-07 LAB — HEPATIC FUNCTION PANEL
ALT: 16 U/L (ref 7–35)
AST: 27 (ref 13–35)
Alkaline Phosphatase: 57 (ref 25–125)
Bilirubin, Total: 0.4

## 2022-01-08 LAB — CANCER ANTIGEN 27.29: CA 27.29: 28.9 U/mL (ref 0.0–38.6)

## 2022-01-09 ENCOUNTER — Inpatient Hospital Stay: Payer: Medicare Other

## 2022-01-09 VITALS — BP 144/68 | HR 94 | Temp 98.3°F | Resp 18 | Ht 62.0 in | Wt 173.1 lb

## 2022-01-09 DIAGNOSIS — C50912 Malignant neoplasm of unspecified site of left female breast: Secondary | ICD-10-CM

## 2022-01-09 DIAGNOSIS — M25661 Stiffness of right knee, not elsewhere classified: Secondary | ICD-10-CM | POA: Diagnosis not present

## 2022-01-09 DIAGNOSIS — Z96651 Presence of right artificial knee joint: Secondary | ICD-10-CM | POA: Diagnosis not present

## 2022-01-09 DIAGNOSIS — C787 Secondary malignant neoplasm of liver and intrahepatic bile duct: Secondary | ICD-10-CM | POA: Diagnosis not present

## 2022-01-09 DIAGNOSIS — Z170 Estrogen receptor positive status [ER+]: Secondary | ICD-10-CM | POA: Diagnosis not present

## 2022-01-09 DIAGNOSIS — C50919 Malignant neoplasm of unspecified site of unspecified female breast: Secondary | ICD-10-CM

## 2022-01-09 DIAGNOSIS — C7951 Secondary malignant neoplasm of bone: Secondary | ICD-10-CM | POA: Diagnosis not present

## 2022-01-09 DIAGNOSIS — Z5111 Encounter for antineoplastic chemotherapy: Secondary | ICD-10-CM | POA: Diagnosis not present

## 2022-01-09 DIAGNOSIS — R2689 Other abnormalities of gait and mobility: Secondary | ICD-10-CM | POA: Diagnosis not present

## 2022-01-09 DIAGNOSIS — M6281 Muscle weakness (generalized): Secondary | ICD-10-CM | POA: Diagnosis not present

## 2022-01-09 DIAGNOSIS — M25561 Pain in right knee: Secondary | ICD-10-CM | POA: Diagnosis not present

## 2022-01-09 MED ORDER — DENOSUMAB 120 MG/1.7ML ~~LOC~~ SOLN
120.0000 mg | Freq: Once | SUBCUTANEOUS | Status: AC
  Administered 2022-01-09: 120 mg via SUBCUTANEOUS
  Filled 2022-01-09: qty 1.7

## 2022-01-09 MED ORDER — FULVESTRANT 250 MG/5ML IM SOSY
500.0000 mg | PREFILLED_SYRINGE | Freq: Once | INTRAMUSCULAR | Status: AC
  Administered 2022-01-09: 500 mg via INTRAMUSCULAR
  Filled 2022-01-09: qty 10

## 2022-01-09 NOTE — Patient Instructions (Signed)
Denosumab Injection (Oncology) What is this medication? DENOSUMAB (den oh SUE mab) prevents weakened bones caused by cancer. It may also be used to treat noncancerous bone tumors that cannot be removed by surgery. It can also be used to treat high calcium levels in the blood caused by cancer. It works by blocking a protein that causes bones to break down quickly. This slows down the release of calcium from bones, which lowers calcium levels in your blood. It also makes your bones stronger and less likely to break (fracture). This medicine may be used for other purposes; ask your health care provider or pharmacist if you have questions. COMMON BRAND NAME(S): XGEVA What should I tell my care team before I take this medication? They need to know if you have any of these conditions: Dental disease Having surgery or tooth extraction Infection Kidney disease Low levels of calcium or vitamin D in the blood Malnutrition On hemodialysis Skin conditions or sensitivity Thyroid or parathyroid disease An unusual reaction to denosumab, other medications, foods, dyes, or preservatives Pregnant or trying to get pregnant Breast-feeding How should I use this medication? This medication is for injection under the skin. It is given by your care team in a hospital or clinic setting. A special MedGuide will be given to you before each treatment. Be sure to read this information carefully each time. Talk to your care team about the use of this medication in children. While it may be prescribed for children as young as 13 years for selected conditions, precautions do apply. Overdosage: If you think you have taken too much of this medicine contact a poison control center or emergency room at once. NOTE: This medicine is only for you. Do not share this medicine with others. What if I miss a dose? Keep appointments for follow-up doses. It is important not to miss your dose. Call your care team if you are unable to  keep an appointment. What may interact with this medication? Do not take this medication with any of the following: Other medications containing denosumab This medication may also interact with the following: Medications that lower your chance of fighting infection Steroid medications, such as prednisone or cortisone This list may not describe all possible interactions. Give your health care provider a list of all the medicines, herbs, non-prescription drugs, or dietary supplements you use. Also tell them if you smoke, drink alcohol, or use illegal drugs. Some items may interact with your medicine. What should I watch for while using this medication? Your condition will be monitored carefully while you are receiving this medication. You may need blood work while taking this medication. This medication may increase your risk of getting an infection. Call your care team for advice if you get a fever, chills, sore throat, or other symptoms of a cold or flu. Do not treat yourself. Try to avoid being around people who are sick. You should make sure you get enough calcium and vitamin D while you are taking this medication, unless your care team tells you not to. Discuss the foods you eat and the vitamins you take with your care team. Some people who take this medication have severe bone, joint, or muscle pain. This medication may also increase your risk for jaw problems or a broken thigh bone. Tell your care team right away if you have severe pain in your jaw, bones, joints, or muscles. Tell your care team if you have any pain that does not go away or that gets worse. Talk   to your care team if you may be pregnant. Serious birth defects can occur if you take this medication during pregnancy and for 5 months after the last dose. You will need a negative pregnancy test before starting this medication. Contraception is recommended while taking this medication and for 5 months after the last dose. Your care team  can help you find the option that works for you. What side effects may I notice from receiving this medication? Side effects that you should report to your care team as soon as possible: Allergic reactions--skin rash, itching, hives, swelling of the face, lips, tongue, or throat Bone, joint, or muscle pain Low calcium level--muscle pain or cramps, confusion, tingling, or numbness in the hands or feet Osteonecrosis of the jaw--pain, swelling, or redness in the mouth, numbness of the jaw, poor healing after dental work, unusual discharge from the mouth, visible bones in the mouth Side effects that usually do not require medical attention (report to your care team if they continue or are bothersome): Cough Diarrhea Fatigue Headache Nausea This list may not describe all possible side effects. Call your doctor for medical advice about side effects. You may report side effects to FDA at 1-800-FDA-1088. Where should I keep my medication? This medication is given in a hospital or clinic. It will not be stored at home. NOTE: This sheet is a summary. It may not cover all possible information. If you have questions about this medicine, talk to your doctor, pharmacist, or health care provider.  2023 Elsevier/Gold Standard (2021-07-29 00:00:00) Fulvestrant Injection What is this medication? FULVESTRANT (ful VES trant) treats breast cancer. It works by blocking the hormone estrogen in breast tissue, which prevents breast cancer cells from spreading or growing. This medicine may be used for other purposes; ask your health care provider or pharmacist if you have questions. COMMON BRAND NAME(S): FASLODEX What should I tell my care team before I take this medication? They need to know if you have any of these conditions: Bleeding disorder Liver disease Low blood cell levels, such as low white cells, red cells, and platelets An unusual or allergic reaction to fulvestrant, other medications, foods, dyes, or  preservatives Pregnant or trying to get pregnant Breast-feeding How should I use this medication? This medication is injected into a muscle. It is given by your care team in a hospital or clinic setting. Talk to your care team about the use of this medication in children. Special care may be needed. Overdosage: If you think you have taken too much of this medicine contact a poison control center or emergency room at once. NOTE: This medicine is only for you. Do not share this medicine with others. What if I miss a dose? Keep appointments for follow-up doses. It is important not to miss your dose. Call your care team if you are unable to keep an appointment. What may interact with this medication? Certain medications that prevent or treat blood clots, such as warfarin, enoxaparin, dalteparin, apixaban, dabigatran, rivaroxaban This list may not describe all possible interactions. Give your health care provider a list of all the medicines, herbs, non-prescription drugs, or dietary supplements you use. Also tell them if you smoke, drink alcohol, or use illegal drugs. Some items may interact with your medicine. What should I watch for while using this medication? Your condition will be monitored carefully while you are receiving this medication. You may need blood work while taking this medication. Talk to your care team if you may be pregnant. Serious  birth defects can occur if you take this medication during pregnancy and for 1 year after the last dose. You will need a negative pregnancy test before starting this medication. Contraception is recommended while taking this medication and for 1 year after the last dose. Your care team can help you find the option that works for you. Do not breastfeed while taking this medication and for 1 year after the last dose. This medication may cause infertility. Talk to your care team if you are concerned about your fertility. What side effects may I notice from  receiving this medication? Side effects that you should report to your care team as soon as possible: Allergic reactions or angioedema--skin rash, itching or hives, swelling of the face, eyes, lips, tongue, arms, or legs, trouble swallowing or breathing Pain, tingling, or numbness in the hands or feet Side effects that usually do not require medical attention (report to your care team if they continue or are bothersome): Bone, joint, or muscle pain Constipation Headache Hot flashes Nausea Pain, redness, or irritation at injection site Unusual weakness or fatigue This list may not describe all possible side effects. Call your doctor for medical advice about side effects. You may report side effects to FDA at 1-800-FDA-1088. Where should I keep my medication? This medication is given in a hospital or clinic. It will not be stored at home. NOTE: This sheet is a summary. It may not cover all possible information. If you have questions about this medicine, talk to your doctor, pharmacist, or health care provider.  2023 [employer]/Gold Standard (2021-07-22 00:00:00)  

## 2022-01-13 DIAGNOSIS — M25561 Pain in right knee: Secondary | ICD-10-CM | POA: Diagnosis not present

## 2022-01-13 DIAGNOSIS — Z96651 Presence of right artificial knee joint: Secondary | ICD-10-CM | POA: Diagnosis not present

## 2022-01-13 DIAGNOSIS — M6281 Muscle weakness (generalized): Secondary | ICD-10-CM | POA: Diagnosis not present

## 2022-01-13 DIAGNOSIS — R2689 Other abnormalities of gait and mobility: Secondary | ICD-10-CM | POA: Diagnosis not present

## 2022-01-13 DIAGNOSIS — M25661 Stiffness of right knee, not elsewhere classified: Secondary | ICD-10-CM | POA: Diagnosis not present

## 2022-01-15 DIAGNOSIS — R198 Other specified symptoms and signs involving the digestive system and abdomen: Secondary | ICD-10-CM | POA: Diagnosis not present

## 2022-01-15 DIAGNOSIS — R202 Paresthesia of skin: Secondary | ICD-10-CM | POA: Diagnosis not present

## 2022-01-15 DIAGNOSIS — J301 Allergic rhinitis due to pollen: Secondary | ICD-10-CM | POA: Diagnosis not present

## 2022-01-15 DIAGNOSIS — K2960 Other gastritis without bleeding: Secondary | ICD-10-CM | POA: Diagnosis not present

## 2022-01-16 DIAGNOSIS — M25561 Pain in right knee: Secondary | ICD-10-CM | POA: Diagnosis not present

## 2022-01-16 DIAGNOSIS — Z96651 Presence of right artificial knee joint: Secondary | ICD-10-CM | POA: Diagnosis not present

## 2022-01-16 DIAGNOSIS — M6281 Muscle weakness (generalized): Secondary | ICD-10-CM | POA: Diagnosis not present

## 2022-01-16 DIAGNOSIS — M25661 Stiffness of right knee, not elsewhere classified: Secondary | ICD-10-CM | POA: Diagnosis not present

## 2022-01-16 DIAGNOSIS — R2689 Other abnormalities of gait and mobility: Secondary | ICD-10-CM | POA: Diagnosis not present

## 2022-01-23 DIAGNOSIS — R2689 Other abnormalities of gait and mobility: Secondary | ICD-10-CM | POA: Diagnosis not present

## 2022-01-23 DIAGNOSIS — M6281 Muscle weakness (generalized): Secondary | ICD-10-CM | POA: Diagnosis not present

## 2022-01-23 DIAGNOSIS — M25661 Stiffness of right knee, not elsewhere classified: Secondary | ICD-10-CM | POA: Diagnosis not present

## 2022-01-23 DIAGNOSIS — Z96651 Presence of right artificial knee joint: Secondary | ICD-10-CM | POA: Diagnosis not present

## 2022-01-23 DIAGNOSIS — M25561 Pain in right knee: Secondary | ICD-10-CM | POA: Diagnosis not present

## 2022-01-27 DIAGNOSIS — R202 Paresthesia of skin: Secondary | ICD-10-CM | POA: Diagnosis not present

## 2022-01-27 DIAGNOSIS — M79641 Pain in right hand: Secondary | ICD-10-CM | POA: Diagnosis not present

## 2022-01-27 DIAGNOSIS — M25631 Stiffness of right wrist, not elsewhere classified: Secondary | ICD-10-CM | POA: Diagnosis not present

## 2022-01-27 DIAGNOSIS — M62541 Muscle wasting and atrophy, not elsewhere classified, right hand: Secondary | ICD-10-CM | POA: Diagnosis not present

## 2022-01-29 DIAGNOSIS — M79641 Pain in right hand: Secondary | ICD-10-CM | POA: Diagnosis not present

## 2022-01-29 DIAGNOSIS — Z5189 Encounter for other specified aftercare: Secondary | ICD-10-CM | POA: Diagnosis not present

## 2022-01-29 DIAGNOSIS — Z4789 Encounter for other orthopedic aftercare: Secondary | ICD-10-CM | POA: Diagnosis not present

## 2022-01-29 DIAGNOSIS — M25631 Stiffness of right wrist, not elsewhere classified: Secondary | ICD-10-CM | POA: Diagnosis not present

## 2022-01-29 DIAGNOSIS — E538 Deficiency of other specified B group vitamins: Secondary | ICD-10-CM | POA: Diagnosis not present

## 2022-01-29 DIAGNOSIS — M62541 Muscle wasting and atrophy, not elsewhere classified, right hand: Secondary | ICD-10-CM | POA: Diagnosis not present

## 2022-01-29 DIAGNOSIS — R202 Paresthesia of skin: Secondary | ICD-10-CM | POA: Diagnosis not present

## 2022-01-30 ENCOUNTER — Other Ambulatory Visit: Payer: Self-pay | Admitting: Oncology

## 2022-01-30 DIAGNOSIS — Z170 Estrogen receptor positive status [ER+]: Secondary | ICD-10-CM

## 2022-02-04 ENCOUNTER — Inpatient Hospital Stay: Payer: Medicare Other | Attending: Oncology

## 2022-02-04 DIAGNOSIS — M79641 Pain in right hand: Secondary | ICD-10-CM | POA: Diagnosis not present

## 2022-02-04 DIAGNOSIS — R202 Paresthesia of skin: Secondary | ICD-10-CM | POA: Diagnosis not present

## 2022-02-04 DIAGNOSIS — Z170 Estrogen receptor positive status [ER+]: Secondary | ICD-10-CM | POA: Insufficient documentation

## 2022-02-04 DIAGNOSIS — C787 Secondary malignant neoplasm of liver and intrahepatic bile duct: Secondary | ICD-10-CM | POA: Diagnosis not present

## 2022-02-04 DIAGNOSIS — C50919 Malignant neoplasm of unspecified site of unspecified female breast: Secondary | ICD-10-CM | POA: Diagnosis not present

## 2022-02-04 DIAGNOSIS — M62541 Muscle wasting and atrophy, not elsewhere classified, right hand: Secondary | ICD-10-CM | POA: Diagnosis not present

## 2022-02-04 DIAGNOSIS — C7951 Secondary malignant neoplasm of bone: Secondary | ICD-10-CM | POA: Diagnosis not present

## 2022-02-04 DIAGNOSIS — Z5111 Encounter for antineoplastic chemotherapy: Secondary | ICD-10-CM | POA: Diagnosis not present

## 2022-02-04 DIAGNOSIS — C50811 Malignant neoplasm of overlapping sites of right female breast: Secondary | ICD-10-CM

## 2022-02-04 DIAGNOSIS — M25631 Stiffness of right wrist, not elsewhere classified: Secondary | ICD-10-CM | POA: Diagnosis not present

## 2022-02-04 LAB — CBC WITH DIFFERENTIAL (CANCER CENTER ONLY)
Abs Immature Granulocytes: 0.01 10*3/uL (ref 0.00–0.07)
Basophils Absolute: 0.1 10*3/uL (ref 0.0–0.1)
Basophils Relative: 2 %
Eosinophils Absolute: 0.1 10*3/uL (ref 0.0–0.5)
Eosinophils Relative: 2 %
HCT: 36.3 % (ref 36.0–46.0)
Hemoglobin: 11.5 g/dL — ABNORMAL LOW (ref 12.0–15.0)
Immature Granulocytes: 0 %
Lymphocytes Relative: 24 %
Lymphs Abs: 0.8 10*3/uL (ref 0.7–4.0)
MCH: 29 pg (ref 26.0–34.0)
MCHC: 31.7 g/dL (ref 30.0–36.0)
MCV: 91.4 fL (ref 80.0–100.0)
Monocytes Absolute: 0.4 10*3/uL (ref 0.1–1.0)
Monocytes Relative: 12 %
Neutro Abs: 2.1 10*3/uL (ref 1.7–7.7)
Neutrophils Relative %: 60 %
Platelet Count: 248 10*3/uL (ref 150–400)
RBC: 3.97 MIL/uL (ref 3.87–5.11)
RDW: 16.5 % — ABNORMAL HIGH (ref 11.5–15.5)
WBC Count: 3.5 10*3/uL — ABNORMAL LOW (ref 4.0–10.5)
nRBC: 0 % (ref 0.0–0.2)

## 2022-02-04 LAB — CMP (CANCER CENTER ONLY)
ALT: 14 U/L (ref 0–44)
AST: 21 U/L (ref 15–41)
Albumin: 4 g/dL (ref 3.5–5.0)
Alkaline Phosphatase: 55 U/L (ref 38–126)
Anion gap: 8 (ref 5–15)
BUN: 15 mg/dL (ref 8–23)
CO2: 24 mmol/L (ref 22–32)
Calcium: 8.9 mg/dL (ref 8.9–10.3)
Chloride: 105 mmol/L (ref 98–111)
Creatinine: 0.83 mg/dL (ref 0.44–1.00)
GFR, Estimated: >60 mL/min (ref >60)
Glucose, Bld: 114 mg/dL — ABNORMAL HIGH (ref 70–99)
Potassium: 4.1 mmol/L (ref 3.5–5.1)
Sodium: 137 mmol/L (ref 135–145)
Total Bilirubin: 0.7 mg/dL (ref 0.3–1.2)
Total Protein: 6.7 g/dL (ref 6.5–8.1)

## 2022-02-05 ENCOUNTER — Telehealth: Payer: Self-pay

## 2022-02-05 LAB — CANCER ANTIGEN 27.29: CA 27.29: 34.7 U/mL (ref 0.0–38.6)

## 2022-02-05 NOTE — Telephone Encounter (Signed)
  Oral Chemotherapy Pharmacist Encounter   Dispensed samples to patient:   Medication: Ibrance 75 mg tablets Instructions: Take 1 tablet by mouth every other day for 21 days on, 7 days off, repeat every 28 days Quantity dispensed: 42 Days supply: 101 Manufacturer: Pfizer Lot: [id number] Exp: 07/22/2023   Drema Halon, PharmD Hematology/Oncology Clinical Pharmacist [facility] [phone]

## 2022-02-06 ENCOUNTER — Inpatient Hospital Stay: Payer: Medicare Other

## 2022-02-06 VITALS — BP 152/82 | HR 90 | Temp 98.2°F | Resp 20 | Ht 62.0 in | Wt 176.8 lb

## 2022-02-06 DIAGNOSIS — R202 Paresthesia of skin: Secondary | ICD-10-CM | POA: Diagnosis not present

## 2022-02-06 DIAGNOSIS — M25631 Stiffness of right wrist, not elsewhere classified: Secondary | ICD-10-CM | POA: Diagnosis not present

## 2022-02-06 DIAGNOSIS — M79641 Pain in right hand: Secondary | ICD-10-CM | POA: Diagnosis not present

## 2022-02-06 DIAGNOSIS — Z5111 Encounter for antineoplastic chemotherapy: Secondary | ICD-10-CM | POA: Diagnosis not present

## 2022-02-06 DIAGNOSIS — C787 Secondary malignant neoplasm of liver and intrahepatic bile duct: Secondary | ICD-10-CM | POA: Diagnosis not present

## 2022-02-06 DIAGNOSIS — C50919 Malignant neoplasm of unspecified site of unspecified female breast: Secondary | ICD-10-CM | POA: Diagnosis not present

## 2022-02-06 DIAGNOSIS — C50912 Malignant neoplasm of unspecified site of left female breast: Secondary | ICD-10-CM

## 2022-02-06 DIAGNOSIS — C7951 Secondary malignant neoplasm of bone: Secondary | ICD-10-CM | POA: Diagnosis not present

## 2022-02-06 DIAGNOSIS — Z170 Estrogen receptor positive status [ER+]: Secondary | ICD-10-CM | POA: Diagnosis not present

## 2022-02-06 DIAGNOSIS — M62541 Muscle wasting and atrophy, not elsewhere classified, right hand: Secondary | ICD-10-CM | POA: Diagnosis not present

## 2022-02-06 MED ORDER — FULVESTRANT 250 MG/5ML IM SOSY
500.0000 mg | PREFILLED_SYRINGE | Freq: Once | INTRAMUSCULAR | Status: AC
  Administered 2022-02-06: 500 mg via INTRAMUSCULAR
  Filled 2022-02-06: qty 10

## 2022-02-06 MED ORDER — DENOSUMAB 120 MG/1.7ML ~~LOC~~ SOLN
120.0000 mg | Freq: Once | SUBCUTANEOUS | Status: AC
  Administered 2022-02-06: 120 mg via SUBCUTANEOUS
  Filled 2022-02-06: qty 1.7

## 2022-02-06 NOTE — Patient Instructions (Signed)
Fulvestrant Injection What is this medication? FULVESTRANT (ful VES trant) treats breast cancer. It works by blocking the hormone estrogen in breast tissue, which prevents breast cancer cells from spreading or growing. This medicine may be used for other purposes; ask your health care provider or pharmacist if you have questions. COMMON BRAND NAME(S): FASLODEX What should I tell my care team before I take this medication? They need to know if you have any of these conditions: Bleeding disorder Liver disease Low blood cell levels, such as low white cells, red cells, and platelets An unusual or allergic reaction to fulvestrant, other medications, foods, dyes, or preservatives Pregnant or trying to get pregnant Breast-feeding How should I use this medication? This medication is injected into a muscle. It is given by your care team in a hospital or clinic setting. Talk to your care team about the use of this medication in children. Special care may be needed. Overdosage: If you think you have taken too much of this medicine contact a poison control center or emergency room at once. NOTE: This medicine is only for you. Do not share this medicine with others. What if I miss a dose? Keep appointments for follow-up doses. It is important not to miss your dose. Call your care team if you are unable to keep an appointment. What may interact with this medication? Certain medications that prevent or treat blood clots, such as warfarin, enoxaparin, dalteparin, apixaban, dabigatran, rivaroxaban This list may not describe all possible interactions. Give your health care provider a list of all the medicines, herbs, non-prescription drugs, or dietary supplements you use. Also tell them if you smoke, drink alcohol, or use illegal drugs. Some items may interact with your medicine. What should I watch for while using this medication? Your condition will be monitored carefully while you are receiving this  medication. You may need blood work while taking this medication. Talk to your care team if you may be pregnant. Serious birth defects can occur if you take this medication during pregnancy and for 1 year after the last dose. You will need a negative pregnancy test before starting this medication. Contraception is recommended while taking this medication and for 1 year after the last dose. Your care team can help you find the option that works for you. Do not breastfeed while taking this medication and for 1 year after the last dose. This medication may cause infertility. Talk to your care team if you are concerned about your fertility. What side effects may I notice from receiving this medication? Side effects that you should report to your care team as soon as possible: Allergic reactions or angioedema--skin rash, itching or hives, swelling of the face, eyes, lips, tongue, arms, or legs, trouble swallowing or breathing Pain, tingling, or numbness in the hands or feet Side effects that usually do not require medical attention (report to your care team if they continue or are bothersome): Bone, joint, or muscle pain Constipation Headache Hot flashes Nausea Pain, redness, or irritation at injection site Unusual weakness or fatigue This list may not describe all possible side effects. Call your doctor for medical advice about side effects. You may report side effects to FDA at 1-800-FDA-1088. Where should I keep my medication? This medication is given in a hospital or clinic. It will not be stored at home. NOTE: This sheet is a summary. It may not cover all possible information. If you have questions about this medicine, talk to your doctor, pharmacist, or health care provider.    2023 Elsevier/Gold Standard (2021-07-22 00:00:00) Denosumab Injection (Oncology) What is this medication? DENOSUMAB (den oh SUE mab) prevents weakened bones caused by cancer. It may also be used to treat noncancerous  bone tumors that cannot be removed by surgery. It can also be used to treat high calcium levels in the blood caused by cancer. It works by blocking a protein that causes bones to break down quickly. This slows down the release of calcium from bones, which lowers calcium levels in your blood. It also makes your bones stronger and less likely to break (fracture). This medicine may be used for other purposes; ask your health care provider or pharmacist if you have questions. COMMON BRAND NAME(S): XGEVA What should I tell my care team before I take this medication? They need to know if you have any of these conditions: Dental disease Having surgery or tooth extraction Infection Kidney disease Low levels of calcium or vitamin D in the blood Malnutrition On hemodialysis Skin conditions or sensitivity Thyroid or parathyroid disease An unusual reaction to denosumab, other medications, foods, dyes, or preservatives Pregnant or trying to get pregnant Breast-feeding How should I use this medication? This medication is for injection under the skin. It is given by your care team in a hospital or clinic setting. A special MedGuide will be given to you before each treatment. Be sure to read this information carefully each time. Talk to your care team about the use of this medication in children. While it may be prescribed for children as young as 13 years for selected conditions, precautions do apply. Overdosage: If you think you have taken too much of this medicine contact a poison control center or emergency room at once. NOTE: This medicine is only for you. Do not share this medicine with others. What if I miss a dose? Keep appointments for follow-up doses. It is important not to miss your dose. Call your care team if you are unable to keep an appointment. What may interact with this medication? Do not take this medication with any of the following: Other medications containing denosumab This  medication may also interact with the following: Medications that lower your chance of fighting infection Steroid medications, such as prednisone or cortisone This list may not describe all possible interactions. Give your health care provider a list of all the medicines, herbs, non-prescription drugs, or dietary supplements you use. Also tell them if you smoke, drink alcohol, or use illegal drugs. Some items may interact with your medicine. What should I watch for while using this medication? Your condition will be monitored carefully while you are receiving this medication. You may need blood work while taking this medication. This medication may increase your risk of getting an infection. Call your care team for advice if you get a fever, chills, sore throat, or other symptoms of a cold or flu. Do not treat yourself. Try to avoid being around people who are sick. You should make sure you get enough calcium and vitamin D while you are taking this medication, unless your care team tells you not to. Discuss the foods you eat and the vitamins you take with your care team. Some people who take this medication have severe bone, joint, or muscle pain. This medication may also increase your risk for jaw problems or a broken thigh bone. Tell your care team right away if you have severe pain in your jaw, bones, joints, or muscles. Tell your care team if you have any pain that does not go  away or that gets worse. Talk to your care team if you may be pregnant. Serious birth defects can occur if you take this medication during pregnancy and for 5 months after the last dose. You will need a negative pregnancy test before starting this medication. Contraception is recommended while taking this medication and for 5 months after the last dose. Your care team can help you find the option that works for you. What side effects may I notice from receiving this medication? Side effects that you should report to your care  team as soon as possible: Allergic reactions--skin rash, itching, hives, swelling of the face, lips, tongue, or throat Bone, joint, or muscle pain Low calcium level--muscle pain or cramps, confusion, tingling, or numbness in the hands or feet Osteonecrosis of the jaw--pain, swelling, or redness in the mouth, numbness of the jaw, poor healing after dental work, unusual discharge from the mouth, visible bones in the mouth Side effects that usually do not require medical attention (report to your care team if they continue or are bothersome): Cough Diarrhea Fatigue Headache Nausea This list may not describe all possible side effects. Call your doctor for medical advice about side effects. You may report side effects to FDA at 1-800-FDA-1088. Where should I keep my medication? This medication is given in a hospital or clinic. It will not be stored at home. NOTE: This sheet is a summary. It may not cover all possible information. If you have questions about this medicine, talk to your doctor, pharmacist, or health care provider.  2023 Elsevier/Gold Standard (2021-07-29 00:00:00)

## 2022-02-17 DIAGNOSIS — M62541 Muscle wasting and atrophy, not elsewhere classified, right hand: Secondary | ICD-10-CM | POA: Diagnosis not present

## 2022-02-17 DIAGNOSIS — M25631 Stiffness of right wrist, not elsewhere classified: Secondary | ICD-10-CM | POA: Diagnosis not present

## 2022-02-17 DIAGNOSIS — Z961 Presence of intraocular lens: Secondary | ICD-10-CM | POA: Diagnosis not present

## 2022-02-17 DIAGNOSIS — M79641 Pain in right hand: Secondary | ICD-10-CM | POA: Diagnosis not present

## 2022-02-17 DIAGNOSIS — R202 Paresthesia of skin: Secondary | ICD-10-CM | POA: Diagnosis not present

## 2022-02-17 DIAGNOSIS — H353131 Nonexudative age-related macular degeneration, bilateral, early dry stage: Secondary | ICD-10-CM | POA: Diagnosis not present

## 2022-02-18 DIAGNOSIS — D485 Neoplasm of uncertain behavior of skin: Secondary | ICD-10-CM | POA: Diagnosis not present

## 2022-02-19 DIAGNOSIS — M62541 Muscle wasting and atrophy, not elsewhere classified, right hand: Secondary | ICD-10-CM | POA: Diagnosis not present

## 2022-02-19 DIAGNOSIS — R202 Paresthesia of skin: Secondary | ICD-10-CM | POA: Diagnosis not present

## 2022-02-19 DIAGNOSIS — M25631 Stiffness of right wrist, not elsewhere classified: Secondary | ICD-10-CM | POA: Diagnosis not present

## 2022-02-19 DIAGNOSIS — M79641 Pain in right hand: Secondary | ICD-10-CM | POA: Diagnosis not present

## 2022-02-24 DIAGNOSIS — R202 Paresthesia of skin: Secondary | ICD-10-CM | POA: Diagnosis not present

## 2022-02-24 DIAGNOSIS — M79641 Pain in right hand: Secondary | ICD-10-CM | POA: Diagnosis not present

## 2022-02-24 DIAGNOSIS — M25631 Stiffness of right wrist, not elsewhere classified: Secondary | ICD-10-CM | POA: Diagnosis not present

## 2022-02-24 DIAGNOSIS — M62541 Muscle wasting and atrophy, not elsewhere classified, right hand: Secondary | ICD-10-CM | POA: Diagnosis not present

## 2022-02-26 DIAGNOSIS — M25631 Stiffness of right wrist, not elsewhere classified: Secondary | ICD-10-CM | POA: Diagnosis not present

## 2022-02-26 DIAGNOSIS — M79641 Pain in right hand: Secondary | ICD-10-CM | POA: Diagnosis not present

## 2022-02-26 DIAGNOSIS — M62541 Muscle wasting and atrophy, not elsewhere classified, right hand: Secondary | ICD-10-CM | POA: Diagnosis not present

## 2022-02-26 DIAGNOSIS — R202 Paresthesia of skin: Secondary | ICD-10-CM | POA: Diagnosis not present

## 2022-02-27 DIAGNOSIS — E538 Deficiency of other specified B group vitamins: Secondary | ICD-10-CM | POA: Diagnosis not present

## 2022-02-27 NOTE — Progress Notes (Signed)
+ Jade [facility]oney  [place],  Jade Mooney  [id number] [phone]  Clinic Day:  03/03/22   Referring physician: Emmaline Kluver, *  ASSESSMENT & PLAN:   Invasive ductal carcinoma of the breast, originally diagnosed in 1997 and treated with right mastectomy. She was treated with high dose chemotherapy followed by stem cell transplantation. She completed 5 years of tamoxifen in November 2002.  She later developed liver metastases, ER and HER2 positive, November 2007. She was treated with Herceptin.  Bone metastases and subcutaneous nodules found in November 2007. Treated with surgery, radiation, and hormonal therapy.   Scalp lesions which were confirmed to be metastatic breast cancer, but HER2 negative in 2015. She was placed on anastrozole in 2015 and changed to letrozole in 2018. At that time she had multiple lytic and sclerotic bone metastases.  She had a metastatic lesion of the right anterior chest in August 2017 confirmed to be adenocarcinoma and treated with excision and radiation.  Mass of the lateral C3 vertebral body with collapse treated with radiation in February 2019. At that time the letrozole was stopped and she was placed on fulvestrant injections.  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 Her doses had to be adjusted and she is now down to 75 mg every other day for 21 days on and 7 days off. She continues the fulvestrant every 28 days as well as denosumab every 28 days. She has been stable on this regimen for over 5 years now. Her white count is mildly low today but neutrophils are normal.  Generalized osteoarthritis which flares on and off..   Hypothyroidism, which has been evaluated with an ultrasound of the thyroid and she is on supplement.  Hard mass of the posterior left knee with induration and swelling of the calf. She has had a negative Doppler for DVT and has no evidence of a  Baker's cyst. This does not appear to be infectious as there is no heat or fluctuance, and did not respond to antibiotics.  I was concerned that this may represent metastatic breast cancer, but the MRI scan really does not support that, and the PET is negative.  This could be edema from her prior radiation but is quite indurated.  Dermatology has also seen and have performed a biopsy on November 28.  The pathology reveals dermal sclerosis and fat necrosis with no evidence of malignancy.  This is most consistent with radiation-induced fibrosis.   Plan: This is a pleasant 76 year old female with widely metastatic bone metastases, but no organ involvement at this time. She remains stable on the current regimen of fulvestrant, Xgeva and palbociclib, so I recommend that we continue this the same.  Her CA 27-29 was went above the normal range in June to 39.7 but has steadily decreased since that time, to 34.7 in November and 26.0 in December 2023. We will continue her current systemic treatment.  The PET scan showed low-level hypermetabolic activity and is felt to be posttraumatic or postinflammatory.  She has stable widespread blastic osseous metastases and no new findings.  She will continue her injections every 4 weeks with fulvestrant and denosumab, but she will skip the second week in January due to other plans.  She will be due for her annual mammogram in early February.  I will see her in three months with CBC, CMP and CA 27.29 for repeat evaluation.  She and her husband understand and agree with this  plan of care. They know to call with any concerns regarding her breast cancer.  I provided 20 minutes of face-to-face time during this this encounter and > 50% was spent counseling as documented under my assessment and plan.    Derwood Kaplan, MD [facility] [address] [place] [address] Dept: [phone] Dept Fax: [phone]    CHIEF COMPLAINT:  CC: Metastatic breast cancer  Current Treatment:  Fulvestrant, Delton See and palbociclib   HISTORY OF PRESENT ILLNESS:  Jade Mooney is a 76 y.o. female who presents as a transfer of care from Dr. Lurline Del for the continued treatment and management of metastatic breast cancer. She was originally diagnosed in 1997, stage III, and underwent right mastectomy. She did receive adjuvant chemotherapy with Taxotere for 4 cycles, and also participated in a [facility] protocol with high-dose chemotherapy, cyclophosphamide/carboplatin/BCNU, followed by stem cell rescue in September 1997. She completed 5 years of tamoxifen in November 2002.  Unfortunately, she developed metastatic liver lesions which were biopsy proven metastatic breast cancer in November 2007. However, the tumor was estrogen receptor and HER2 positive, and so she was treated with Herceptin for a period of time.   In July 2015, she was found to have incidental findings of mixed lytic and sclerotic bony lesions on CT abdomen and pelvis. She underwent biopsy of the right iliac bone and surgical pathology confirmed invasive ductal carcinoma, grade 2, estrogen and progesterone receptor positive. Bone density from May 2015 was normal. She was started on zolendronic acid every 12 weeks in July 2015, but this was discontinued after January 2019. She was also found to have three scalp lesions, biopsied in July 2015 confirming metastatic breast cancer. HER2 negative. She was started on anastrozole in August 2015, and was switched to letrozole in April 2018. However, this was discontinued in February 2019 due to progression. She was started on fulvestrant in February 2019.  She developed a skin lesion of the right anterior chest wall in July 2015, and biopsy revealed adenocarcinoma. This was resected in August 2017, and she received radiation, completed in December 2018. Palbociclib was added in January 2018 after PET imaging  revealed 2 new foci at T8 and the manubrium. She received palliative radiation to the cervical spine in February 2019. Palbociclib was held during that time and resumed at 75 mg daily once completed. Delton See was started in March 2019.     I have reviewed her chart and materials related to her cancer extensively and collaborated history with the patient. Summary of oncologic history is as follows: Oncology History  Breast cancer metastasized to bone ([place])  10/12/2013 Initial Diagnosis   Breast cancer metastasized to bone ([place])   02/02/2020 - 11/14/2021 Chemotherapy   Patient is on Treatment Plan : BREAST FULVESTRANT & XGEVA Q28D     Primary malignant neoplasm of breast with metastasis ([place])  07/12/2015 Initial Diagnosis   Metastatic breast cancer ([place])   02/02/2020 - 11/14/2021 Chemotherapy   Patient is on Treatment Plan : BREAST FULVESTRANT & XGEVA Q28D     INTERVAL HISTORY:  Shamela transfered her care to [facility] in late 2022. PET imaging from December 1st 2022 revealed no FDG-avid soft tissue metastasis; diffuse sclerotic osseous metastasis, without FDG affinity; similar left chest wall or medial left breast subcutaneous 5 mm nodule with low-level hypermetabolism, favored benign and incidental. She continues palbociclib 75 mg every other day for 21 days on and 7 days off without difficulty.  She continues fulvestrant every 28 days since February 2019 and Xgeva without difficulty as well. She does complain of chronic continuous pain of the right knee that she rates as a 5/10, and had a total knee replacement.  She has had a swelling of her posterior left leg at the popliteal fossa with induration inferior to that of the calf. She did have a Doppler which was negative for deep venous thrombosis. This is mildly painful and an MRI was very nonspecific with just skin thickening and subcutaneous edema at the posterior medial aspect of the proximal lower leg at the site of the swelling. She previously had a  bone lesion within the left fibular metadiaphysis which was radiated and now demonstrates predominantly fatty marrow signal consistent with fatty replacement of a treated osseous metastatic lesion.  She has now seen the dermatologist and a biopsy was performed.  This reveals dermal sclerosis and fat necrosis but no evidence of malignancy.  This is most consistent with radiation-induced fibrosis. Her widespread sclerotic bone metastases are stable. Her WBC's are normal and her hemoglobin has increased from 11.5 to 11.8.  CMP is unremarkable.  Her CA 27.29 was elevated 1 point above normal last year but has steadily decreased since that time to 34.7 in November and 26.0 in December.  Her  appetite is good, and her weight is stable.  He does describe occasional gagging spells and has tried a nasal spray for sinusitis and postnasal drip.  We plan a PET scan at the end of December.  She will continue her injections every 4 weeks but we will skip the second week in January.  She is up-to-date on her vaccines, and I did recommend the RSV vaccine.  She denies fever, chills or other signs of infection.  She denies nausea, vomiting, bowel issues, or abdominal pain.  She denies sore throat, cough, dyspnea, or chest pain.  HISTORY:   Past Medical History:  Diagnosis Date   Anxiety    Cancer [facility])    Breast 1997 right tx with mastectomy and chemo, metastatic now   GERD (gastroesophageal reflux disease)    Hypercholesterolemia    Hypertension    Hypothyroidism    Osteoarthritis    oa   Personal history of chemotherapy    Personal history of radiation therapy    Secondary malignant neoplasm of bone and bone marrow ([facility])    TIA (transient ischemic attack) last 09/10/20   x 3 total, they seem to occur every 5 years    Past Surgical History:  Procedure Laterality Date   CATARACT EXTRACTION, BILATERAL     CERVICAL FUSION  2020   MASTECTOMY MODIFIED RADICAL Right 1997   porta cath insertion  1997   later  removed   radiation tx  finished 02-25-16   x 20 tx   TONSILLECTOMY     TOTAL HIP ARTHROPLASTY Left 03/12/2016   Procedure: LEFT TOTAL HIP ARTHROPLASTY ANTERIOR APPROACH;  Surgeon: Gaynelle Arabian, MD;  Location: [facility];  Service: Orthopedics;  Laterality: Left;   TOTAL KNEE ARTHROPLASTY Left    TOTAL KNEE ARTHROPLASTY Right 10/30/2021   Procedure: TOTAL KNEE ARTHROPLASTY;  Surgeon: Sydnee Cabal, MD;  Location: [facility];  Service: Orthopedics;  Laterality: Right;  adductor canal 120   TUBAL LIGATION      Family History  Problem Relation Age of Onset   Breast cancer Maternal Aunt     Social History:  reports that she has never smoked. She has never used smokeless tobacco. She  reports current alcohol use. She reports that she does not use drugs.The patient is accompanied by her husband [name] today. She is married and lives at home with her spouse. They are planning a cruise. She is retired from work, and has never been exposed to chemicals or other toxic agents.  Allergies:  Allergies  Allergen Reactions   Other Nausea And Vomiting   Amoxicillin Hives    Has patient had a PCN reaction causing immediate rash, facial/tongue/throat swelling, SOB or lightheadedness with hypotension:unsure Has patient had a PCN reaction causing severe rash involving mucus membranes or skin necrosis:No Has patient had a PCN reaction that required hospitalization:No Has patient had a PCN reaction occurring within the last 10 years: Yes If all of the above answers are "NO", then may proceed with Cephalosporin use.    Erythromycin Other (See Comments)   Oxycodone Hcl Nausea And Vomiting   Percocet [Oxycodone-Acetaminophen] Nausea And Vomiting   Percodan [Oxycodone-Aspirin] Nausea And Vomiting   Tramadol Itching    Current Medications: Current Outpatient Medications  Medication Sig Dispense Refill   acetaminophen (TYLENOL) 500 MG tablet Take 1,000 mg by mouth every 6 (six) hours as needed for mild pain.      acidophilus (RISAQUAD) CAPS capsule Take 1 capsule by mouth daily.     ascorbic acid (VITAMIN C) 1000 MG tablet Take 1,000 mg by mouth daily.     aspirin 325 MG tablet Take 325 mg by mouth daily.     beta carotene 15 MG capsule Take 15 mg by mouth daily.     Biotin 5000 MCG TABS Take 5,000 mcg by mouth daily.     calcium citrate-vitamin D (CITRACAL+D) 315-200 MG-UNIT tablet Take 1 tablet by mouth in the morning and at bedtime.     cholecalciferol (VITAMIN D) 1000 units tablet Take 1,000 Units by mouth daily.     clopidogrel (PLAVIX) 75 MG tablet Take 1 tablet (75 mg total) by mouth daily.     cyanocobalamin (,VITAMIN B-12,) 1000 MCG/ML injection Inject 1,000 mcg into the muscle every 30 (thirty) days.     Denosumab (XGEVA [place]) Inject 1 Dose into the skin every 30 (thirty) days.     famotidine (PEPCID) 40 MG tablet Take 40 mg by mouth daily.     glucosamine-chondroitin 500-400 MG tablet Take 1 tablet by mouth daily.     ketotifen (ZADITOR) 0.025 % ophthalmic solution Place 1 drop into both eyes daily as needed (allergies).     levothyroxine (SYNTHROID, LEVOTHROID) 88 MCG tablet Take 88 mcg by mouth daily before breakfast.     LORazepam (ATIVAN) 1 MG tablet Take 1 tablet (1 mg total) by mouth every 8 (eight) hours as needed for anxiety. 30 tablet 0   Lutein 20 MG CAPS Take 20 mg by mouth daily.     meclizine (ANTIVERT) 25 MG tablet Take 25 mg by mouth 3 (three) times daily as needed for dizziness.     melatonin 5 MG TABS Take 5 mg by mouth at bedtime as needed.     metroNIDAZOLE (METROCREAM) 0.75 % cream Apply 1 Application topically daily.     Multiple Vitamins-Minerals (MULTIVITAMIN WITH MINERALS) tablet Take 1 tablet by mouth daily.     Omega-3 1000 MG CAPS Take 1,000 mg by mouth in the morning and at bedtime.     palbociclib (IBRANCE) 75 MG capsule Take 1 capsule (75 mg total) by mouth daily with breakfast. Take whole with food on days 1-21, every 28 days. (Patient taking differently:  Take 75  mg by mouth See admin instructions. Take 75 mg by mouth every other day with food on days 1-21 with 7 days off, every 28 days.) 21 capsule 6   pantoprazole (PROTONIX) 40 MG tablet Take 40 mg by mouth 2 (two) times daily. Before breakfast and before supper     Polyethyl Glycol-Propyl Glycol 0.4-0.3 % SOLN Place 1-2 drops into both eyes 2 (two) times daily.     rOPINIRole (REQUIP) 2 MG tablet Take 4 mg by mouth at bedtime.     rosuvastatin (CRESTOR) 20 MG tablet Take 1 tablet (20 mg total) by mouth daily.     vitamin E 180 MG (400 UNITS) capsule Take 400 Units by mouth daily.     zinc gluconate 50 MG tablet Take 50 mg by mouth daily.     No current facility-administered medications for this visit.    REVIEW OF SYSTEMS:  Review of Systems  Constitutional: Negative.  Negative for appetite change, chills, fatigue, fever and unexpected weight change.  HENT:  Negative.    Eyes: Negative.   Respiratory: Negative.  Negative for chest tightness, cough, hemoptysis, shortness of breath and wheezing.   Cardiovascular:  Positive for leg swelling (of the left medial leg near the popliteal fossa). Negative for chest pain and palpitations.       The posterior popliteal fossa has an increasing area of induration which is nontender with no obvious inflammation and looks similar to panniculitis to me.  She now also has a small nodule in the posterior leg just above the popliteal fossa.  Gastrointestinal: Negative.  Negative for abdominal distention, abdominal pain, blood in stool, constipation, diarrhea, nausea and vomiting.  Endocrine: Negative.   Genitourinary: Negative.  Negative for difficulty urinating, dysuria, frequency and hematuria.   Musculoskeletal:  Positive for arthralgias (diffuse). Negative for back pain, flank pain, gait problem and myalgias.  Skin: Negative.   Neurological: Negative.  Negative for dizziness, extremity weakness, gait problem, headaches, light-headedness, numbness, seizures and  speech difficulty.  Hematological: Negative.   Psychiatric/Behavioral: Negative.  Negative for depression and sleep disturbance. The patient is not nervous/anxious.       VITALS:  Blood pressure (!) 183/85, pulse 72, temperature 98.5 F (36.9 C), temperature source Oral, resp. rate 18, height _0  (1.575 m), weight 176 lb 1.6 oz (79.9 kg), SpO2 99 %.  Wt Readings from Last 3 Encounters:  03/06/22 174 lb (78.9 kg)  03/03/22 176 lb 1.6 oz (79.9 kg)  02/06/22 176 lb 12 oz (80.2 kg)    Body mass index is 32.21 kg/m.  Performance status (ECOG): 1 - Symptomatic but completely ambulatory  PHYSICAL EXAM:  Physical Exam Constitutional:      General: She is not in acute distress.    Appearance: Normal appearance. She is normal weight.  HENT:     Head: Normocephalic and atraumatic.  Eyes:     General: No scleral icterus.    Extraocular Movements: Extraocular movements intact.     Conjunctiva/sclera: Conjunctivae normal.     Pupils: Pupils are equal, round, and reactive to light.  Cardiovascular:     Rate and Rhythm: Normal rate and regular rhythm.     Pulses: Normal pulses.     Heart sounds: Normal heart sounds. No murmur heard.    No friction rub. No gallop.  Pulmonary:     Effort: Pulmonary effort is normal. No respiratory distress.     Breath sounds: Normal breath sounds.  Chest:     Comments:  Right mastectomy is negative and left breast is without masses. Abdominal:     General: Bowel sounds are normal. There is no distension.     Palpations: Abdomen is soft. There is no hepatomegaly, splenomegaly or mass.     Tenderness: There is no abdominal tenderness.  Musculoskeletal:        General: Normal range of motion.     Cervical back: Normal range of motion and neck supple.     Right lower leg: No edema.     Left lower leg: No edema.     Comments: Large nodules of her PIP joints of the hands consistent with advanced osteoarthritis. Left leg has induration of the calf with a  mass effect of the lower popliteal fossa. This is not hot, erythematous or fluctuant.  She now has a small 1 cm nodule in the posterior left leg above the popliteal fossa  Lymphadenopathy:     Cervical: No cervical adenopathy.  Skin:    General: Skin is warm and dry.  Neurological:     General: No focal deficit present.     Mental Status: She is alert and oriented to person, place, and time. Mental status is at baseline.  Psychiatric:        Mood and Affect: Mood normal.        Behavior: Behavior normal.        Thought Content: Thought content normal.        Judgment: Judgment normal.      LABS:      Latest Ref Rng & Units 03/03/2022   10:11 AM 02/04/2022   10:25 AM 01/07/2022   12:00 AM  CBC  WBC 4.0 - 10.5 K/uL 4.0  3.5  3.0      Hemoglobin 12.0 - 15.0 g/dL 11.8  11.5  11.6      Hematocrit 36.0 - 46.0 % 37.2  36.3  34      Platelets 150 - 400 K/uL 251  248  264         This result is from an external source.      Latest Ref Rng & Units 03/03/2022   10:11 AM 02/04/2022   10:25 AM 01/07/2022   12:00 AM  CMP  Glucose 70 - 99 mg/dL 123  114    BUN 8 - 23 mg/dL _0 Creatinine 0.44 - 1.00 mg/dL 0.85  0.83  0.8      Sodium 135 - 145 mmol/L 137  137  130      Potassium 3.5 - 5.1 mmol/L 4.0  4.1  4.2      Chloride 98 - 111 mmol/L 105  105  101      CO2 22 - 32 mmol/L _1 Calcium 8.9 - 10.3 mg/dL 9.1  8.9  9.1      Total Protein 6.5 - 8.1 g/dL 6.5  6.7    Total Bilirubin 0.3 - 1.2 mg/dL 0.5  0.7    Alkaline Phos 38 - 126 U/L 49  55  57      AST 15 - 41 U/L _2 ALT 0 - 44 U/L _3 This result is from an external source.     Lab Results  Component Value Date   LDH 138 01/17/2011   LDH 155  12/28/2009   LDH 155 12/29/2008    STUDIES:    NUCLEAR MEDICINE PET WHOLE BODY 06/25/21 TECHNIQUE:  14.7 mCi F-18 FDG was injected intravenously. Full-ring PET imaging  was performed from the head to foot after the radiotracer.  CT data  was obtained and used for attenuation correction and anatomic  localization.  Fasting blood glucose: NA mg/dl  COMPARISON: PET-CT 02/21/2021. Lower leg MRI 03/29/2021.  FINDINGS:  Mediastinal blood pool activity: SUV max 2.7  HEAD/ NECK:  No hypermetabolic cervical lymph nodes are identified.There are no  lesions of the pharyngeal mucosal space. Mild diffuse thyroid  activity, improved from previous study and likely physiologic.  Incidental CT findings: none  CHEST:  There are no hypermetabolic mediastinal, hilar, axillary or internal  mammary lymph nodes. Small partially calcified subcutaneous nodule  medially in the left breast is again noted with low level  hypermetabolic activity, unchanged from the previous study. No  suspicious breast activity. There is also low level hypermetabolic  activity (SUV max 2.4) associated with a partially calcified lower  right paraspinal nodule (image 155/3), similar to previous study and  probably an osteophyte. No hypermetabolic pulmonary activity or  suspicious nodularity.  Incidental CT findings: Scattered pulmonary scarring bilaterally.  Mild aortic and coronary artery atherosclerosis. Previous right  mastectomy.  ABDOMEN/PELVIS:  There is no hypermetabolic activity within the liver, adrenal  glands, spleen or pancreas. There is no hypermetabolic nodal  activity.  Incidental CT findings: Ill-defined low-density hepatic lesions are  grossly stable, without hypermetabolic activity. Small gallstones  are present. There are diverticular changes throughout the colon and  mild aortic atherosclerosis.  SKELETON:  There is no hypermetabolic activity to suggest metabolically active  recurrent metastatic disease. Widespread blastic metastatic disease  appears grossly stable.  Incidental CT findings: As above, chronic widespread osseous  metastatic disease which appears grossly unchanged. Previous  cervical fusion and left total hip  arthroplasty.  EXTREMITIES: There is no hypermetabolic activity within the bones to  suggest recurrent viable metastatic disease. Widespread treated  metastases are grossly stable. There are subcutaneous calcifications  posteromedially in the proximal left lower leg as seen on previous  MRI. These demonstrate low-level metabolic activity (SUV max 2.5).  No suspicious soft tissue masses.  Incidental CT findings: Status post left total knee arthroplasty.  Scattered vascular calcifications.  IMPRESSION:  1. No evidence of local recurrent breast cancer or viable distant  metastatic disease.  2. Grossly stable widespread blastic osseous metastatic disease,  without hypermetabolic activity consistent with chronic treated  disease.  3. As seen on previous lower leg MRI, there is subcutaneous soft  tissue thickening and calcification posteromedially in the proximal  left lower leg with low level hypermetabolic activity, likely  posttraumatic or postinflammatory.  4. Incidental findings including cholelithiasis, colonic  diverticulosis, coronary and Aortic Atherosclerosis (ICD10-I70.0).  Electronically Signed  By: Richardean Sale M.D.  On: 06/26/2021 10:45       EXAM: 03/29/21 MRI OF THE LEFT TIBIA AND FIBULA WITHOUT AND WITH CONTRAST  TECHNIQUE:  Multiplanar, multisequence MR imaging of the left tibia and fibula  was performed both before and after administration of intravenous  contrast.  CONTRAST: 7.5 mL Gadavist IV contrast  COMPARISON: MRI 10/30/2014  FINDINGS:  Bones/Joint/Cartilage  Metallic susceptibility artifact related to patient's left knee  arthroplasty hardware degrades evaluation of the adjacent bony and  soft tissue structures the proximal aspect of the field of view.  This also results in regionally poor fat saturation which  unfortunately involves  the area of clinical concern as demarcated by  a skin marker (series 14, image 8).  On the previous MRI of 10/30/2014, a  bone lesion was visualized  within the left fibular metadiaphysis demonstrating low T1 marrow  signal. This area demonstrates predominantly fatty T1 hyperintense  marrow signal on the current study (series 4, image 10), which may  represent fatty replacement of a treated osseous metastatic lesion.  Remaining osseous structures demonstrate no evidence of a marrow  replacing bone lesion. No bone marrow edema. No evidence of fracture  or malalignment within the limitations of this exam.  Ligaments  Not characterized at the edge of the field of view.  Muscles and Tendons  Normal muscle bulk and signal intensity without edema, atrophy, or  fatty infiltration. Included tendinous structures appear intact and  unremarkable.  Soft tissues  Skin thickening and subcutaneous edema is seen at the posteromedial  aspect of the proximal lower leg at site of patient's clinical  concern. No organized fluid collection. No discernible solid or  cystic mass at this location. Evaluation for enhancement within the  tissues is unable to be assessed secondary to previously described  artifact. Relatively mild circumferential subcutaneous edema  throughout the remainder of the lower leg. Elsewhere, there is no  focal fluid collection or enhancing mass.   IMPRESSION:  1. Skin thickening and subcutaneous edema at the posteromedial  aspect of the proximal lower leg at site of patient's clinical  concern, nonspecific. Cellulitis could have this appearance in the  appropriate clinical setting. No fluid collection or discernible  mass is evident at this location. Evaluation of this location is  somewhat degraded by artifact, as discussed above.  2. On the previous MRI of 10/30/2014, a bone lesion was visualized  within the left fibular metadiaphysis demonstrating low T1 marrow  signal. 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 Remaining osseous structures  demonstrate no evidence of a marrow replacing bone lesion.   Electronically Signed  By: Davina Poke D.O.  On: 03/29/2021 10:45

## 2022-03-03 ENCOUNTER — Encounter: Payer: Self-pay | Admitting: Oncology

## 2022-03-03 ENCOUNTER — Other Ambulatory Visit: Payer: Self-pay | Admitting: Oncology

## 2022-03-03 ENCOUNTER — Inpatient Hospital Stay: Payer: Medicare Other | Attending: Oncology | Admitting: Oncology

## 2022-03-03 ENCOUNTER — Inpatient Hospital Stay: Payer: Medicare Other

## 2022-03-03 VITALS — BP 183/85 | HR 72 | Temp 98.5°F | Resp 18 | Ht 62.0 in | Wt 176.1 lb

## 2022-03-03 DIAGNOSIS — C50919 Malignant neoplasm of unspecified site of unspecified female breast: Secondary | ICD-10-CM | POA: Diagnosis not present

## 2022-03-03 DIAGNOSIS — Z5111 Encounter for antineoplastic chemotherapy: Secondary | ICD-10-CM | POA: Insufficient documentation

## 2022-03-03 DIAGNOSIS — M25631 Stiffness of right wrist, not elsewhere classified: Secondary | ICD-10-CM | POA: Diagnosis not present

## 2022-03-03 DIAGNOSIS — Z170 Estrogen receptor positive status [ER+]: Secondary | ICD-10-CM

## 2022-03-03 DIAGNOSIS — C7951 Secondary malignant neoplasm of bone: Secondary | ICD-10-CM | POA: Diagnosis not present

## 2022-03-03 DIAGNOSIS — M79641 Pain in right hand: Secondary | ICD-10-CM | POA: Diagnosis not present

## 2022-03-03 DIAGNOSIS — M62541 Muscle wasting and atrophy, not elsewhere classified, right hand: Secondary | ICD-10-CM | POA: Diagnosis not present

## 2022-03-03 DIAGNOSIS — C50811 Malignant neoplasm of overlapping sites of right female breast: Secondary | ICD-10-CM | POA: Diagnosis not present

## 2022-03-03 DIAGNOSIS — R202 Paresthesia of skin: Secondary | ICD-10-CM | POA: Diagnosis not present

## 2022-03-03 DIAGNOSIS — C787 Secondary malignant neoplasm of liver and intrahepatic bile duct: Secondary | ICD-10-CM | POA: Diagnosis not present

## 2022-03-03 LAB — CBC WITH DIFFERENTIAL (CANCER CENTER ONLY)
Abs Immature Granulocytes: 0.02 10*3/uL (ref 0.00–0.07)
Basophils Absolute: 0.1 10*3/uL (ref 0.0–0.1)
Basophils Relative: 2 %
Eosinophils Absolute: 0.1 10*3/uL (ref 0.0–0.5)
Eosinophils Relative: 2 %
HCT: 37.2 % (ref 36.0–46.0)
Hemoglobin: 11.8 g/dL — ABNORMAL LOW (ref 12.0–15.0)
Immature Granulocytes: 1 %
Lymphocytes Relative: 22 %
Lymphs Abs: 0.9 10*3/uL (ref 0.7–4.0)
MCH: 28.3 pg (ref 26.0–34.0)
MCHC: 31.7 g/dL (ref 30.0–36.0)
MCV: 89.2 fL (ref 80.0–100.0)
Monocytes Absolute: 0.5 10*3/uL (ref 0.1–1.0)
Monocytes Relative: 11 %
Neutro Abs: 2.5 10*3/uL (ref 1.7–7.7)
Neutrophils Relative %: 62 %
Platelet Count: 251 10*3/uL (ref 150–400)
RBC: 4.17 MIL/uL (ref 3.87–5.11)
RDW: 16.3 % — ABNORMAL HIGH (ref 11.5–15.5)
WBC Count: 4 10*3/uL (ref 4.0–10.5)
nRBC: 0 % (ref 0.0–0.2)

## 2022-03-03 LAB — CMP (CANCER CENTER ONLY)
ALT: 15 U/L (ref 0–44)
AST: 23 U/L (ref 15–41)
Albumin: 4.1 g/dL (ref 3.5–5.0)
Alkaline Phosphatase: 49 U/L (ref 38–126)
Anion gap: 9 (ref 5–15)
BUN: 15 mg/dL (ref 8–23)
CO2: 23 mmol/L (ref 22–32)
Calcium: 9.1 mg/dL (ref 8.9–10.3)
Chloride: 105 mmol/L (ref 98–111)
Creatinine: 0.85 mg/dL (ref 0.44–1.00)
GFR, Estimated: >60 mL/min (ref >60)
Glucose, Bld: 123 mg/dL — ABNORMAL HIGH (ref 70–99)
Potassium: 4 mmol/L (ref 3.5–5.1)
Sodium: 137 mmol/L (ref 135–145)
Total Bilirubin: 0.5 mg/dL (ref 0.3–1.2)
Total Protein: 6.5 g/dL (ref 6.5–8.1)

## 2022-03-04 ENCOUNTER — Other Ambulatory Visit: Payer: Medicare Other

## 2022-03-04 LAB — CANCER ANTIGEN 27.29: CA 27.29: 26 U/mL (ref 0.0–38.6)

## 2022-03-05 ENCOUNTER — Telehealth: Payer: Self-pay

## 2022-03-05 DIAGNOSIS — M62541 Muscle wasting and atrophy, not elsewhere classified, right hand: Secondary | ICD-10-CM | POA: Diagnosis not present

## 2022-03-05 DIAGNOSIS — M79641 Pain in right hand: Secondary | ICD-10-CM | POA: Diagnosis not present

## 2022-03-05 DIAGNOSIS — M25631 Stiffness of right wrist, not elsewhere classified: Secondary | ICD-10-CM | POA: Diagnosis not present

## 2022-03-05 DIAGNOSIS — R202 Paresthesia of skin: Secondary | ICD-10-CM | POA: Diagnosis not present

## 2022-03-05 NOTE — Telephone Encounter (Signed)
-----   Message from Derwood Kaplan, MD sent at 03/04/2022 10:16 AM EST ----- Regarding: call Tell her labs look good and CA 27.29 back down to 26

## 2022-03-05 NOTE — Telephone Encounter (Signed)
Patient notified of lab results

## 2022-03-06 ENCOUNTER — Inpatient Hospital Stay: Payer: Medicare Other

## 2022-03-06 ENCOUNTER — Encounter: Payer: Self-pay | Admitting: Oncology

## 2022-03-06 VITALS — BP 158/76 | HR 70 | Temp 98.1°F | Resp 18 | Ht 62.0 in | Wt 174.0 lb

## 2022-03-06 DIAGNOSIS — C50919 Malignant neoplasm of unspecified site of unspecified female breast: Secondary | ICD-10-CM

## 2022-03-06 DIAGNOSIS — C50912 Malignant neoplasm of unspecified site of left female breast: Secondary | ICD-10-CM

## 2022-03-06 DIAGNOSIS — Z170 Estrogen receptor positive status [ER+]: Secondary | ICD-10-CM | POA: Diagnosis not present

## 2022-03-06 DIAGNOSIS — Z5111 Encounter for antineoplastic chemotherapy: Secondary | ICD-10-CM | POA: Diagnosis not present

## 2022-03-06 DIAGNOSIS — C7951 Secondary malignant neoplasm of bone: Secondary | ICD-10-CM | POA: Diagnosis not present

## 2022-03-06 DIAGNOSIS — C787 Secondary malignant neoplasm of liver and intrahepatic bile duct: Secondary | ICD-10-CM | POA: Diagnosis not present

## 2022-03-06 MED ORDER — FULVESTRANT 250 MG/5ML IM SOSY
500.0000 mg | PREFILLED_SYRINGE | Freq: Once | INTRAMUSCULAR | Status: AC
  Administered 2022-03-06: 500 mg via INTRAMUSCULAR
  Filled 2022-03-06: qty 10

## 2022-03-06 MED ORDER — DENOSUMAB 120 MG/1.7ML ~~LOC~~ SOLN
120.0000 mg | Freq: Once | SUBCUTANEOUS | Status: AC
  Administered 2022-03-06: 120 mg via SUBCUTANEOUS
  Filled 2022-03-06: qty 1.7

## 2022-03-06 NOTE — Patient Instructions (Signed)
Denosumab Injection (Oncology) What is this medication? DENOSUMAB (den oh SUE mab) prevents weakened bones caused by cancer. It may also be used to treat noncancerous bone tumors that cannot be removed by surgery. It can also be used to treat high calcium levels in the blood caused by cancer. It works by blocking a protein that causes bones to break down quickly. This slows down the release of calcium from bones, which lowers calcium levels in your blood. It also makes your bones stronger and less likely to break (fracture). This medicine may be used for other purposes; ask your health care provider or pharmacist if you have questions. COMMON BRAND NAME(S): XGEVA What should I tell my care team before I take this medication? They need to know if you have any of these conditions: Dental disease Having surgery or tooth extraction Infection Kidney disease Low levels of calcium or vitamin D in the blood Malnutrition On hemodialysis Skin conditions or sensitivity Thyroid or parathyroid disease An unusual reaction to denosumab, other medications, foods, dyes, or preservatives Pregnant or trying to get pregnant Breast-feeding How should I use this medication? This medication is for injection under the skin. It is given by your care team in a hospital or clinic setting. A special MedGuide will be given to you before each treatment. Be sure to read this information carefully each time. Talk to your care team about the use of this medication in children. While it may be prescribed for children as young as 13 years for selected conditions, precautions do apply. Overdosage: If you think you have taken too much of this medicine contact a poison control center or emergency room at once. NOTE: This medicine is only for you. Do not share this medicine with others. What if I miss a dose? Keep appointments for follow-up doses. It is important not to miss your dose. Call your care team if you are unable to  keep an appointment. What may interact with this medication? Do not take this medication with any of the following: Other medications containing denosumab This medication may also interact with the following: Medications that lower your chance of fighting infection Steroid medications, such as prednisone or cortisone This list may not describe all possible interactions. Give your health care provider a list of all the medicines, herbs, non-prescription drugs, or dietary supplements you use. Also tell them if you smoke, drink alcohol, or use illegal drugs. Some items may interact with your medicine. What should I watch for while using this medication? Your condition will be monitored carefully while you are receiving this medication. You may need blood work while taking this medication. This medication may increase your risk of getting an infection. Call your care team for advice if you get a fever, chills, sore throat, or other symptoms of a cold or flu. Do not treat yourself. Try to avoid being around people who are sick. You should make sure you get enough calcium and vitamin D while you are taking this medication, unless your care team tells you not to. Discuss the foods you eat and the vitamins you take with your care team. Some people who take this medication have severe bone, joint, or muscle pain. This medication may also increase your risk for jaw problems or a broken thigh bone. Tell your care team right away if you have severe pain in your jaw, bones, joints, or muscles. Tell your care team if you have any pain that does not go away or that gets worse. Talk   to your care team if you may be pregnant. Serious birth defects can occur if you take this medication during pregnancy and for 5 months after the last dose. You will need a negative pregnancy test before starting this medication. Contraception is recommended while taking this medication and for 5 months after the last dose. Your care team  can help you find the option that works for you. What side effects may I notice from receiving this medication? Side effects that you should report to your care team as soon as possible: Allergic reactions--skin rash, itching, hives, swelling of the face, lips, tongue, or throat Bone, joint, or muscle pain Low calcium level--muscle pain or cramps, confusion, tingling, or numbness in the hands or feet Osteonecrosis of the jaw--pain, swelling, or redness in the mouth, numbness of the jaw, poor healing after dental work, unusual discharge from the mouth, visible bones in the mouth Side effects that usually do not require medical attention (report to your care team if they continue or are bothersome): Cough Diarrhea Fatigue Headache Nausea This list may not describe all possible side effects. Call your doctor for medical advice about side effects. You may report side effects to FDA at 1-800-FDA-1088. Where should I keep my medication? This medication is given in a hospital or clinic. It will not be stored at home. NOTE: This sheet is a summary. It may not cover all possible information. If you have questions about this medicine, talk to your doctor, pharmacist, or health care provider.  2023 Elsevier/Gold Standard (2021-07-29 00:00:00) Fulvestrant Injection What is this medication? FULVESTRANT (ful VES trant) treats breast cancer. It works by blocking the hormone estrogen in breast tissue, which prevents breast cancer cells from spreading or growing. This medicine may be used for other purposes; ask your health care provider or pharmacist if you have questions. COMMON BRAND NAME(S): FASLODEX What should I tell my care team before I take this medication? They need to know if you have any of these conditions: Bleeding disorder Liver disease Low blood cell levels, such as low white cells, red cells, and platelets An unusual or allergic reaction to fulvestrant, other medications, foods, dyes, or  preservatives Pregnant or trying to get pregnant Breast-feeding How should I use this medication? This medication is injected into a muscle. It is given by your care team in a hospital or clinic setting. Talk to your care team about the use of this medication in children. Special care may be needed. Overdosage: If you think you have taken too much of this medicine contact a poison control center or emergency room at once. NOTE: This medicine is only for you. Do not share this medicine with others. What if I miss a dose? Keep appointments for follow-up doses. It is important not to miss your dose. Call your care team if you are unable to keep an appointment. What may interact with this medication? Certain medications that prevent or treat blood clots, such as warfarin, enoxaparin, dalteparin, apixaban, dabigatran, rivaroxaban This list may not describe all possible interactions. Give your health care provider a list of all the medicines, herbs, non-prescription drugs, or dietary supplements you use. Also tell them if you smoke, drink alcohol, or use illegal drugs. Some items may interact with your medicine. What should I watch for while using this medication? Your condition will be monitored carefully while you are receiving this medication. You may need blood work while taking this medication. Talk to your care team if you may be pregnant. Serious  birth defects can occur if you take this medication during pregnancy and for 1 year after the last dose. You will need a negative pregnancy test before starting this medication. Contraception is recommended while taking this medication and for 1 year after the last dose. Your care team can help you find the option that works for you. Do not breastfeed while taking this medication and for 1 year after the last dose. This medication may cause infertility. Talk to your care team if you are concerned about your fertility. What side effects may I notice from  receiving this medication? Side effects that you should report to your care team as soon as possible: Allergic reactions or angioedema--skin rash, itching or hives, swelling of the face, eyes, lips, tongue, arms, or legs, trouble swallowing or breathing Pain, tingling, or numbness in the hands or feet Side effects that usually do not require medical attention (report to your care team if they continue or are bothersome): Bone, joint, or muscle pain Constipation Headache Hot flashes Nausea Pain, redness, or irritation at injection site Unusual weakness or fatigue This list may not describe all possible side effects. Call your doctor for medical advice about side effects. You may report side effects to FDA at 1-800-FDA-1088. Where should I keep my medication? This medication is given in a hospital or clinic. It will not be stored at home. NOTE: This sheet is a summary. It may not cover all possible information. If you have questions about this medicine, talk to your doctor, pharmacist, or health care provider.  2023 Elsevier/Gold Standard (2021-07-22 00:00:00)

## 2022-03-06 NOTE — Addendum Note (Signed)
Addended by: Juanetta Beets on: 03/06/2022 12:39 PM   Modules accepted: Orders

## 2022-03-10 DIAGNOSIS — M79641 Pain in right hand: Secondary | ICD-10-CM | POA: Diagnosis not present

## 2022-03-10 DIAGNOSIS — M25631 Stiffness of right wrist, not elsewhere classified: Secondary | ICD-10-CM | POA: Diagnosis not present

## 2022-03-10 DIAGNOSIS — R202 Paresthesia of skin: Secondary | ICD-10-CM | POA: Diagnosis not present

## 2022-03-10 DIAGNOSIS — M62541 Muscle wasting and atrophy, not elsewhere classified, right hand: Secondary | ICD-10-CM | POA: Diagnosis not present

## 2022-03-13 DIAGNOSIS — R202 Paresthesia of skin: Secondary | ICD-10-CM | POA: Diagnosis not present

## 2022-03-13 DIAGNOSIS — M25631 Stiffness of right wrist, not elsewhere classified: Secondary | ICD-10-CM | POA: Diagnosis not present

## 2022-03-13 DIAGNOSIS — M62541 Muscle wasting and atrophy, not elsewhere classified, right hand: Secondary | ICD-10-CM | POA: Diagnosis not present

## 2022-03-13 DIAGNOSIS — M79641 Pain in right hand: Secondary | ICD-10-CM | POA: Diagnosis not present

## 2022-03-19 DIAGNOSIS — C50919 Malignant neoplasm of unspecified site of unspecified female breast: Secondary | ICD-10-CM | POA: Diagnosis not present

## 2022-03-19 DIAGNOSIS — C7951 Secondary malignant neoplasm of bone: Secondary | ICD-10-CM | POA: Diagnosis not present

## 2022-03-19 DIAGNOSIS — I700 Atherosclerosis of aorta: Secondary | ICD-10-CM | POA: Diagnosis not present

## 2022-03-27 ENCOUNTER — Encounter: Payer: Self-pay | Admitting: Oncology

## 2022-03-28 ENCOUNTER — Other Ambulatory Visit: Payer: Self-pay | Admitting: Oncology

## 2022-03-28 DIAGNOSIS — E538 Deficiency of other specified B group vitamins: Secondary | ICD-10-CM | POA: Diagnosis not present

## 2022-03-28 DIAGNOSIS — C50911 Malignant neoplasm of unspecified site of right female breast: Secondary | ICD-10-CM | POA: Diagnosis not present

## 2022-03-28 DIAGNOSIS — R198 Other specified symptoms and signs involving the digestive system and abdomen: Secondary | ICD-10-CM | POA: Diagnosis not present

## 2022-03-28 DIAGNOSIS — Z1231 Encounter for screening mammogram for malignant neoplasm of breast: Secondary | ICD-10-CM

## 2022-03-28 DIAGNOSIS — K2960 Other gastritis without bleeding: Secondary | ICD-10-CM | POA: Diagnosis not present

## 2022-04-02 ENCOUNTER — Encounter: Payer: Self-pay | Admitting: Oncology

## 2022-04-03 ENCOUNTER — Encounter: Payer: Self-pay | Admitting: Oncology

## 2022-04-03 ENCOUNTER — Other Ambulatory Visit: Payer: Self-pay | Admitting: Hematology and Oncology

## 2022-04-03 ENCOUNTER — Telehealth: Payer: Self-pay | Admitting: Pharmacy Technician

## 2022-04-03 ENCOUNTER — Other Ambulatory Visit (HOSPITAL_COMMUNITY): Payer: Self-pay

## 2022-04-03 DIAGNOSIS — Z170 Estrogen receptor positive status [ER+]: Secondary | ICD-10-CM

## 2022-04-03 MED ORDER — PALBOCICLIB 75 MG PO TABS
75.0000 mg | ORAL_TABLET | Freq: Every day | ORAL | 5 refills | Status: DC
  Filled 2022-04-03: qty 21, 21d supply, fill #0
  Filled 2022-05-06: qty 21, 28d supply, fill #0
  Filled 2022-05-06: qty 21, 21d supply, fill #0
  Filled 2022-07-03: qty 21, 21d supply, fill #1
  Filled 2022-08-05: qty 21, 28d supply, fill #2
  Filled 2022-10-08: qty 21, 28d supply, fill #3
  Filled 2022-11-19: qty 21, 28d supply, fill #4
  Filled 2023-01-12: qty 21, 28d supply, fill #5

## 2022-04-03 NOTE — Telephone Encounter (Signed)
Oral Oncology Patient Advocate Encounter  Was successful in securing patient a $15,000 grant from [employer] to provide copayment coverage for [place].  This will keep the out of pocket expense at $0.     [employer] ID: [id number]  I have spoken with the patient.   The billing information is as follows and has been shared with [facility].    RxBin: [id number] PCN: [id number] Member ID: [id number] Group ID: [id number] Dates of Eligibility: 03/04/22 through 03/04/23  Fund:  [place], CPhT-Adv Oncology Pharmacy Patient [place] Direct Number: [phone]  Fax: [phone]

## 2022-04-05 ENCOUNTER — Encounter: Payer: Self-pay | Admitting: Oncology

## 2022-04-08 ENCOUNTER — Inpatient Hospital Stay: Payer: Medicare Other | Attending: Oncology

## 2022-04-08 ENCOUNTER — Encounter: Payer: Self-pay | Admitting: Oncology

## 2022-04-08 DIAGNOSIS — C50919 Malignant neoplasm of unspecified site of unspecified female breast: Secondary | ICD-10-CM | POA: Diagnosis not present

## 2022-04-08 DIAGNOSIS — C7951 Secondary malignant neoplasm of bone: Secondary | ICD-10-CM | POA: Diagnosis not present

## 2022-04-08 DIAGNOSIS — Z170 Estrogen receptor positive status [ER+]: Secondary | ICD-10-CM | POA: Insufficient documentation

## 2022-04-08 DIAGNOSIS — Z5111 Encounter for antineoplastic chemotherapy: Secondary | ICD-10-CM | POA: Insufficient documentation

## 2022-04-08 DIAGNOSIS — C787 Secondary malignant neoplasm of liver and intrahepatic bile duct: Secondary | ICD-10-CM | POA: Diagnosis not present

## 2022-04-08 LAB — CMP (CANCER CENTER ONLY)
ALT: 19 U/L (ref 0–44)
AST: 27 U/L (ref 15–41)
Albumin: 4.3 g/dL (ref 3.5–5.0)
Alkaline Phosphatase: 54 U/L (ref 38–126)
Anion gap: 9 (ref 5–15)
BUN: 16 mg/dL (ref 8–23)
CO2: 23 mmol/L (ref 22–32)
Calcium: 9 mg/dL (ref 8.9–10.3)
Chloride: 103 mmol/L (ref 98–111)
Creatinine: 0.84 mg/dL (ref 0.44–1.00)
GFR, Estimated: >60 mL/min (ref >60)
Glucose, Bld: 88 mg/dL (ref 70–99)
Potassium: 4 mmol/L (ref 3.5–5.1)
Sodium: 135 mmol/L (ref 135–145)
Total Bilirubin: 0.6 mg/dL (ref 0.3–1.2)
Total Protein: 7 g/dL (ref 6.5–8.1)

## 2022-04-08 LAB — CBC WITH DIFFERENTIAL (CANCER CENTER ONLY)
Abs Immature Granulocytes: 0.02 10*3/uL (ref 0.00–0.07)
Basophils Absolute: 0.1 10*3/uL (ref 0.0–0.1)
Basophils Relative: 2 %
Eosinophils Absolute: 0.1 10*3/uL (ref 0.0–0.5)
Eosinophils Relative: 2 %
HCT: 36.6 % (ref 36.0–46.0)
Hemoglobin: 11.9 g/dL — ABNORMAL LOW (ref 12.0–15.0)
Immature Granulocytes: 1 %
Lymphocytes Relative: 28 %
Lymphs Abs: 0.9 10*3/uL (ref 0.7–4.0)
MCH: 29.2 pg (ref 26.0–34.0)
MCHC: 32.5 g/dL (ref 30.0–36.0)
MCV: 89.7 fL (ref 80.0–100.0)
Monocytes Absolute: 0.5 10*3/uL (ref 0.1–1.0)
Monocytes Relative: 16 %
Neutro Abs: 1.7 10*3/uL (ref 1.7–7.7)
Neutrophils Relative %: 51 %
Platelet Count: 205 10*3/uL (ref 150–400)
RBC: 4.08 MIL/uL (ref 3.87–5.11)
RDW: 16.8 % — ABNORMAL HIGH (ref 11.5–15.5)
WBC Count: 3.3 10*3/uL — ABNORMAL LOW (ref 4.0–10.5)
nRBC: 0 % (ref 0.0–0.2)

## 2022-04-09 LAB — CANCER ANTIGEN 27.29: CA 27.29: 29.9 U/mL (ref 0.0–38.6)

## 2022-04-10 ENCOUNTER — Inpatient Hospital Stay: Payer: Medicare Other

## 2022-04-10 VITALS — BP 148/74 | HR 65 | Temp 98.5°F | Resp 18 | Ht 62.0 in | Wt 178.8 lb

## 2022-04-10 DIAGNOSIS — Z5111 Encounter for antineoplastic chemotherapy: Secondary | ICD-10-CM | POA: Diagnosis not present

## 2022-04-10 DIAGNOSIS — C50912 Malignant neoplasm of unspecified site of left female breast: Secondary | ICD-10-CM

## 2022-04-10 DIAGNOSIS — C50919 Malignant neoplasm of unspecified site of unspecified female breast: Secondary | ICD-10-CM

## 2022-04-10 DIAGNOSIS — Z170 Estrogen receptor positive status [ER+]: Secondary | ICD-10-CM | POA: Diagnosis not present

## 2022-04-10 DIAGNOSIS — C7951 Secondary malignant neoplasm of bone: Secondary | ICD-10-CM | POA: Diagnosis not present

## 2022-04-10 DIAGNOSIS — C787 Secondary malignant neoplasm of liver and intrahepatic bile duct: Secondary | ICD-10-CM | POA: Diagnosis not present

## 2022-04-10 MED ORDER — FULVESTRANT 250 MG/5ML IM SOSY
500.0000 mg | PREFILLED_SYRINGE | Freq: Once | INTRAMUSCULAR | Status: AC
  Administered 2022-04-10: 500 mg via INTRAMUSCULAR
  Filled 2022-04-10: qty 10

## 2022-04-10 MED ORDER — DENOSUMAB 120 MG/1.7ML ~~LOC~~ SOLN
120.0000 mg | Freq: Once | SUBCUTANEOUS | Status: AC
  Administered 2022-04-10: 120 mg via SUBCUTANEOUS
  Filled 2022-04-10: qty 1.7

## 2022-04-10 NOTE — Patient Instructions (Signed)
Denosumab Injection (Oncology) What is this medication? DENOSUMAB (den oh SUE mab) prevents weakened bones caused by cancer. It may also be used to treat noncancerous bone tumors that cannot be removed by surgery. It can also be used to treat high calcium levels in the blood caused by cancer. It works by blocking a protein that causes bones to break down quickly. This slows down the release of calcium from bones, which lowers calcium levels in your blood. It also makes your bones stronger and less likely to break (fracture). This medicine may be used for other purposes; ask your health care provider or pharmacist if you have questions. COMMON BRAND NAME(S): XGEVA What should I tell my care team before I take this medication? They need to know if you have any of these conditions: Dental disease Having surgery or tooth extraction Infection Kidney disease Low levels of calcium or vitamin D in the blood Malnutrition On hemodialysis Skin conditions or sensitivity Thyroid or parathyroid disease An unusual reaction to denosumab, other medications, foods, dyes, or preservatives Pregnant or trying to get pregnant Breast-feeding How should I use this medication? This medication is for injection under the skin. It is given by your care team in a hospital or clinic setting. A special MedGuide will be given to you before each treatment. Be sure to read this information carefully each time. Talk to your care team about the use of this medication in children. While it may be prescribed for children as young as 13 years for selected conditions, precautions do apply. Overdosage: If you think you have taken too much of this medicine contact a poison control center or emergency room at once. NOTE: This medicine is only for you. Do not share this medicine with others. What if I miss a dose? Keep appointments for follow-up doses. It is important not to miss your dose. Call your care team if you are unable to  keep an appointment. What may interact with this medication? Do not take this medication with any of the following: Other medications containing denosumab This medication may also interact with the following: Medications that lower your chance of fighting infection Steroid medications, such as prednisone or cortisone This list may not describe all possible interactions. Give your health care provider a list of all the medicines, herbs, non-prescription drugs, or dietary supplements you use. Also tell them if you smoke, drink alcohol, or use illegal drugs. Some items may interact with your medicine. What should I watch for while using this medication? Your condition will be monitored carefully while you are receiving this medication. You may need blood work while taking this medication. This medication may increase your risk of getting an infection. Call your care team for advice if you get a fever, chills, sore throat, or other symptoms of a cold or flu. Do not treat yourself. Try to avoid being around people who are sick. You should make sure you get enough calcium and vitamin D while you are taking this medication, unless your care team tells you not to. Discuss the foods you eat and the vitamins you take with your care team. Some people who take this medication have severe bone, joint, or muscle pain. This medication may also increase your risk for jaw problems or a broken thigh bone. Tell your care team right away if you have severe pain in your jaw, bones, joints, or muscles. Tell your care team if you have any pain that does not go away or that gets worse. Talk  to your care team if you may be pregnant. Serious birth defects can occur if you take this medication during pregnancy and for 5 months after the last dose. You will need a negative pregnancy test before starting this medication. Contraception is recommended while taking this medication and for 5 months after the last dose. Your care team  can help you find the option that works for you. What side effects may I notice from receiving this medication? Side effects that you should report to your care team as soon as possible: Allergic reactions--skin rash, itching, hives, swelling of the face, lips, tongue, or throat Bone, joint, or muscle pain Low calcium level--muscle pain or cramps, confusion, tingling, or numbness in the hands or feet Osteonecrosis of the jaw--pain, swelling, or redness in the mouth, numbness of the jaw, poor healing after dental work, unusual discharge from the mouth, visible bones in the mouth Side effects that usually do not require medical attention (report to your care team if they continue or are bothersome): Cough Diarrhea Fatigue Headache Nausea This list may not describe all possible side effects. Call your doctor for medical advice about side effects. You may report side effects to FDA at 1-800-FDA-1088. Where should I keep my medication? This medication is given in a hospital or clinic. It will not be stored at home. NOTE: This sheet is a summary. It may not cover all possible information. If you have questions about this medicine, talk to your doctor, pharmacist, or health care provider.  2023 Elsevier/Gold Standard (2021-07-29 00:00:00) Fulvestrant Injection What is this medication? FULVESTRANT (ful VES trant) treats breast cancer. It works by blocking the hormone estrogen in breast tissue, which prevents breast cancer cells from spreading or growing. This medicine may be used for other purposes; ask your health care provider or pharmacist if you have questions. COMMON BRAND NAME(S): FASLODEX What should I tell my care team before I take this medication? They need to know if you have any of these conditions: Bleeding disorder Liver disease Low blood cell levels, such as low white cells, red cells, and platelets An unusual or allergic reaction to fulvestrant, other medications, foods, dyes, or  preservatives Pregnant or trying to get pregnant Breast-feeding How should I use this medication? This medication is injected into a muscle. It is given by your care team in a hospital or clinic setting. Talk to your care team about the use of this medication in children. Special care may be needed. Overdosage: If you think you have taken too much of this medicine contact a poison control center or emergency room at once. NOTE: This medicine is only for you. Do not share this medicine with others. What if I miss a dose? Keep appointments for follow-up doses. It is important not to miss your dose. Call your care team if you are unable to keep an appointment. What may interact with this medication? Certain medications that prevent or treat blood clots, such as warfarin, enoxaparin, dalteparin, apixaban, dabigatran, rivaroxaban This list may not describe all possible interactions. Give your health care provider a list of all the medicines, herbs, non-prescription drugs, or dietary supplements you use. Also tell them if you smoke, drink alcohol, or use illegal drugs. Some items may interact with your medicine. What should I watch for while using this medication? Your condition will be monitored carefully while you are receiving this medication. You may need blood work while taking this medication. Talk to your care team if you may be pregnant. Serious   Serious birth defects can occur if you take this medication during pregnancy and for 1 year after the last dose. You will need a negative pregnancy test before starting this medication. Contraception is recommended while taking this medication and for 1 year after the last dose. Your care team can help you find the option that works for you. Do not breastfeed while taking this medication and for 1 year after the last dose. This medication may cause infertility. Talk to your care team if you are concerned about your fertility. What side effects may I notice  from receiving this medication? Side effects that you should report to your care team as soon as possible: Allergic reactions or angioedema--skin rash, itching or hives, swelling of the face, eyes, lips, tongue, arms, or legs, trouble swallowing or breathing Pain, tingling, or numbness in the hands or feet Side effects that usually do not require medical attention (report to your care team if they continue or are bothersome): Bone, joint, or muscle pain Constipation Headache Hot flashes Nausea Pain, redness, or irritation at injection site Unusual weakness or fatigue This list may not describe all possible side effects. Call your doctor for medical advice about side effects. You may report side effects to FDA at 1-800-FDA-1088. Where should I keep my medication? This medication is given in a hospital or clinic. It will not be stored at home. NOTE: This sheet is a summary. It may not cover all possible information. If you have questions about this medicine, talk to your doctor, pharmacist, or health care provider.  2023 Elsevier/Gold Standard (2021-07-22 00:00:00)

## 2022-04-14 ENCOUNTER — Telehealth: Payer: Self-pay

## 2022-04-14 NOTE — Telephone Encounter (Signed)
-----  Message from Derwood Kaplan, MD sent at 04/14/2022  9:17 AM EST ----- Regarding: call Tell her WBC down a little to 3.3 but ANC looks good, CMP looks good and CA 27.29 remains normal

## 2022-04-14 NOTE — Telephone Encounter (Signed)
Patient notified of lab results

## 2022-04-29 DIAGNOSIS — D519 Vitamin B12 deficiency anemia, unspecified: Secondary | ICD-10-CM | POA: Diagnosis not present

## 2022-04-30 DIAGNOSIS — J301 Allergic rhinitis due to pollen: Secondary | ICD-10-CM | POA: Diagnosis not present

## 2022-04-30 DIAGNOSIS — J40 Bronchitis, not specified as acute or chronic: Secondary | ICD-10-CM | POA: Diagnosis not present

## 2022-04-30 DIAGNOSIS — J329 Chronic sinusitis, unspecified: Secondary | ICD-10-CM | POA: Diagnosis not present

## 2022-05-05 DIAGNOSIS — M79642 Pain in left hand: Secondary | ICD-10-CM | POA: Diagnosis not present

## 2022-05-05 DIAGNOSIS — R202 Paresthesia of skin: Secondary | ICD-10-CM | POA: Diagnosis not present

## 2022-05-05 DIAGNOSIS — M25532 Pain in left wrist: Secondary | ICD-10-CM | POA: Diagnosis not present

## 2022-05-05 DIAGNOSIS — M62542 Muscle wasting and atrophy, not elsewhere classified, left hand: Secondary | ICD-10-CM | POA: Diagnosis not present

## 2022-05-06 ENCOUNTER — Other Ambulatory Visit (HOSPITAL_COMMUNITY): Payer: Self-pay

## 2022-05-06 ENCOUNTER — Other Ambulatory Visit: Payer: Self-pay

## 2022-05-06 ENCOUNTER — Inpatient Hospital Stay: Payer: Medicare Other | Attending: Oncology

## 2022-05-06 DIAGNOSIS — C787 Secondary malignant neoplasm of liver and intrahepatic bile duct: Secondary | ICD-10-CM | POA: Diagnosis not present

## 2022-05-06 DIAGNOSIS — Z5111 Encounter for antineoplastic chemotherapy: Secondary | ICD-10-CM | POA: Diagnosis not present

## 2022-05-06 DIAGNOSIS — C50919 Malignant neoplasm of unspecified site of unspecified female breast: Secondary | ICD-10-CM | POA: Diagnosis not present

## 2022-05-06 DIAGNOSIS — C7951 Secondary malignant neoplasm of bone: Secondary | ICD-10-CM | POA: Insufficient documentation

## 2022-05-06 DIAGNOSIS — Z170 Estrogen receptor positive status [ER+]: Secondary | ICD-10-CM

## 2022-05-06 LAB — CMP (CANCER CENTER ONLY)
ALT: 17 U/L (ref 0–44)
AST: 22 U/L (ref 15–41)
Albumin: 3.8 g/dL (ref 3.5–5.0)
Alkaline Phosphatase: 48 U/L (ref 38–126)
Anion gap: 11 (ref 5–15)
BUN: 20 mg/dL (ref 8–23)
CO2: 25 mmol/L (ref 22–32)
Calcium: 9.1 mg/dL (ref 8.9–10.3)
Chloride: 98 mmol/L (ref 98–111)
Creatinine: 1.02 mg/dL — ABNORMAL HIGH (ref 0.44–1.00)
GFR, Estimated: 57 mL/min — ABNORMAL LOW (ref >60)
Glucose, Bld: 70 mg/dL (ref 70–99)
Potassium: 4 mmol/L (ref 3.5–5.1)
Sodium: 134 mmol/L — ABNORMAL LOW (ref 135–145)
Total Bilirubin: 0.4 mg/dL (ref 0.3–1.2)
Total Protein: 7.1 g/dL (ref 6.5–8.1)

## 2022-05-06 LAB — CBC WITH DIFFERENTIAL (CANCER CENTER ONLY)
Abs Immature Granulocytes: 0.17 10*3/uL — ABNORMAL HIGH (ref 0.00–0.07)
Basophils Absolute: 0.1 10*3/uL (ref 0.0–0.1)
Basophils Relative: 1 %
Eosinophils Absolute: 0.2 10*3/uL (ref 0.0–0.5)
Eosinophils Relative: 3 %
HCT: 36.7 % (ref 36.0–46.0)
Hemoglobin: 12 g/dL (ref 12.0–15.0)
Immature Granulocytes: 2 %
Lymphocytes Relative: 25 %
Lymphs Abs: 2.2 10*3/uL (ref 0.7–4.0)
MCH: 28.9 pg (ref 26.0–34.0)
MCHC: 32.7 g/dL (ref 30.0–36.0)
MCV: 88.4 fL (ref 80.0–100.0)
Monocytes Absolute: 0.8 10*3/uL (ref 0.1–1.0)
Monocytes Relative: 9 %
Neutro Abs: 5.1 10*3/uL (ref 1.7–7.7)
Neutrophils Relative %: 60 %
Platelet Count: 358 10*3/uL (ref 150–400)
RBC: 4.15 MIL/uL (ref 3.87–5.11)
RDW: 16 % — ABNORMAL HIGH (ref 11.5–15.5)
WBC Count: 8.5 10*3/uL (ref 4.0–10.5)
nRBC: 0.2 % (ref 0.0–0.2)

## 2022-05-07 LAB — CANCER ANTIGEN 27.29: CA 27.29: 15.1 U/mL (ref 0.0–38.6)

## 2022-05-08 ENCOUNTER — Inpatient Hospital Stay: Payer: Medicare Other

## 2022-05-08 VITALS — BP 157/74 | HR 85 | Temp 98.0°F | Resp 18 | Ht 62.0 in | Wt 172.8 lb

## 2022-05-08 DIAGNOSIS — Z5111 Encounter for antineoplastic chemotherapy: Secondary | ICD-10-CM | POA: Diagnosis not present

## 2022-05-08 DIAGNOSIS — C50912 Malignant neoplasm of unspecified site of left female breast: Secondary | ICD-10-CM

## 2022-05-08 DIAGNOSIS — C7951 Secondary malignant neoplasm of bone: Secondary | ICD-10-CM | POA: Diagnosis not present

## 2022-05-08 DIAGNOSIS — C50919 Malignant neoplasm of unspecified site of unspecified female breast: Secondary | ICD-10-CM | POA: Diagnosis not present

## 2022-05-08 DIAGNOSIS — C787 Secondary malignant neoplasm of liver and intrahepatic bile duct: Secondary | ICD-10-CM | POA: Diagnosis not present

## 2022-05-08 MED ORDER — FULVESTRANT 250 MG/5ML IM SOSY
500.0000 mg | PREFILLED_SYRINGE | Freq: Once | INTRAMUSCULAR | Status: AC
  Administered 2022-05-08: 500 mg via INTRAMUSCULAR
  Filled 2022-05-08: qty 10

## 2022-05-08 MED ORDER — DENOSUMAB 120 MG/1.7ML ~~LOC~~ SOLN
120.0000 mg | Freq: Once | SUBCUTANEOUS | Status: AC
  Administered 2022-05-08: 120 mg via SUBCUTANEOUS
  Filled 2022-05-08: qty 1.7

## 2022-05-08 NOTE — Patient Instructions (Signed)
Fulvestrant Injection What is this medication? FULVESTRANT (ful VES trant) treats breast cancer. It works by blocking the hormone estrogen in breast tissue, which prevents breast cancer cells from spreading or growing. This medicine may be used for other purposes; ask your health care provider or pharmacist if you have questions. COMMON BRAND NAME(S): FASLODEX What should I tell my care team before I take this medication? They need to know if you have any of these conditions: Bleeding disorder Liver disease Low blood cell levels, such as low white cells, red cells, and platelets An unusual or allergic reaction to fulvestrant, other medications, foods, dyes, or preservatives Pregnant or trying to get pregnant Breast-feeding How should I use this medication? This medication is injected into a muscle. It is given by your care team in a hospital or clinic setting. Talk to your care team about the use of this medication in children. Special care may be needed. Overdosage: If you think you have taken too much of this medicine contact a poison control center or emergency room at once. NOTE: This medicine is only for you. Do not share this medicine with others. What if I miss a dose? Keep appointments for follow-up doses. It is important not to miss your dose. Call your care team if you are unable to keep an appointment. What may interact with this medication? Certain medications that prevent or treat blood clots, such as warfarin, enoxaparin, dalteparin, apixaban, dabigatran, rivaroxaban This list may not describe all possible interactions. Give your health care provider a list of all the medicines, herbs, non-prescription drugs, or dietary supplements you use. Also tell them if you smoke, drink alcohol, or use illegal drugs. Some items may interact with your medicine. What should I watch for while using this medication? Your condition will be monitored carefully while you are receiving this  medication. You may need blood work while taking this medication. Talk to your care team if you may be pregnant. Serious birth defects can occur if you take this medication during pregnancy and for 1 year after the last dose. You will need a negative pregnancy test before starting this medication. Contraception is recommended while taking this medication and for 1 year after the last dose. Your care team can help you find the option that works for you. Do not breastfeed while taking this medication and for 1 year after the last dose. This medication may cause infertility. Talk to your care team if you are concerned about your fertility. What side effects may I notice from receiving this medication? Side effects that you should report to your care team as soon as possible: Allergic reactions or angioedema--skin rash, itching or hives, swelling of the face, eyes, lips, tongue, arms, or legs, trouble swallowing or breathing Pain, tingling, or numbness in the hands or feet Side effects that usually do not require medical attention (report to your care team if they continue or are bothersome): Bone, joint, or muscle pain Constipation Headache Hot flashes Nausea Pain, redness, or irritation at injection site Unusual weakness or fatigue This list may not describe all possible side effects. Call your doctor for medical advice about side effects. You may report side effects to FDA at 1-800-FDA-1088. Where should I keep my medication? This medication is given in a hospital or clinic. It will not be stored at home. NOTE: This sheet is a summary. It may not cover all possible information. If you have questions about this medicine, talk to your doctor, pharmacist, or health care provider.  2023 [employer]/Gold Standard (2021-07-22 00:00:00) Denosumab Injection (Oncology) What is this medication? DENOSUMAB (den oh SUE mab) prevents weakened bones caused by cancer. It may also be used to treat noncancerous  bone tumors that cannot be removed by surgery. It can also be used to treat high calcium levels in the blood caused by cancer. It works by blocking a protein that causes bones to break down quickly. This slows down the release of calcium from bones, which lowers calcium levels in your blood. It also makes your bones stronger and less likely to break (fracture). This medicine may be used for other purposes; ask your health care provider or pharmacist if you have questions. COMMON BRAND NAME(S): XGEVA What should I tell my care team before I take this medication? They need to know if you have any of these conditions: Dental disease Having surgery or tooth extraction Infection Kidney disease Low levels of calcium or vitamin D in the blood Malnutrition On hemodialysis Skin conditions or sensitivity Thyroid or parathyroid disease An unusual reaction to denosumab, other medications, foods, dyes, or preservatives Pregnant or trying to get pregnant Breast-feeding How should I use this medication? This medication is for injection under the skin. It is given by your care team in a hospital or clinic setting. A special MedGuide will be given to you before each treatment. Be sure to read this information carefully each time. Talk to your care team about the use of this medication in children. While it may be prescribed for children as young as 13 years for selected conditions, precautions do apply. Overdosage: If you think you have taken too much of this medicine contact a poison control center or emergency room at once. NOTE: This medicine is only for you. Do not share this medicine with others. What if I miss a dose? Keep appointments for follow-up doses. It is important not to miss your dose. Call your care team if you are unable to keep an appointment. What may interact with this medication? Do not take this medication with any of the following: Other medications containing denosumab This  medication may also interact with the following: Medications that lower your chance of fighting infection Steroid medications, such as prednisone or cortisone This list may not describe all possible interactions. Give your health care provider a list of all the medicines, herbs, non-prescription drugs, or dietary supplements you use. Also tell them if you smoke, drink alcohol, or use illegal drugs. Some items may interact with your medicine. What should I watch for while using this medication? Your condition will be monitored carefully while you are receiving this medication. You may need blood work while taking this medication. This medication may increase your risk of getting an infection. Call your care team for advice if you get a fever, chills, sore throat, or other symptoms of a cold or flu. Do not treat yourself. Try to avoid being around people who are sick. You should make sure you get enough calcium and vitamin D while you are taking this medication, unless your care team tells you not to. Discuss the foods you eat and the vitamins you take with your care team. Some people who take this medication have severe bone, joint, or muscle pain. This medication may also increase your risk for jaw problems or a broken thigh bone. Tell your care team right away if you have severe pain in your jaw, bones, joints, or muscles. Tell your care team if you have any pain that does not go  away or that gets worse. Talk to your care team if you may be pregnant. Serious birth defects can occur if you take this medication during pregnancy and for 5 months after the last dose. You will need a negative pregnancy test before starting this medication. Contraception is recommended while taking this medication and for 5 months after the last dose. Your care team can help you find the option that works for you. What side effects may I notice from receiving this medication? Side effects that you should report to your care  team as soon as possible: Allergic reactions--skin rash, itching, hives, swelling of the face, lips, tongue, or throat Bone, joint, or muscle pain Low calcium level--muscle pain or cramps, confusion, tingling, or numbness in the hands or feet Osteonecrosis of the jaw--pain, swelling, or redness in the mouth, numbness of the jaw, poor healing after dental work, unusual discharge from the mouth, visible bones in the mouth Side effects that usually do not require medical attention (report to your care team if they continue or are bothersome): Cough Diarrhea Fatigue Headache Nausea This list may not describe all possible side effects. Call your doctor for medical advice about side effects. You may report side effects to FDA at 1-800-FDA-1088. Where should I keep my medication? This medication is given in a hospital or clinic. It will not be stored at home. NOTE: This sheet is a summary. It may not cover all possible information. If you have questions about this medicine, talk to your doctor, pharmacist, or health care provider.  2023 [employer]/Gold Standard (2021-07-29 00:00:00)

## 2022-05-09 ENCOUNTER — Telehealth: Payer: Self-pay

## 2022-05-09 DIAGNOSIS — R202 Paresthesia of skin: Secondary | ICD-10-CM | POA: Diagnosis not present

## 2022-05-09 DIAGNOSIS — M62542 Muscle wasting and atrophy, not elsewhere classified, left hand: Secondary | ICD-10-CM | POA: Diagnosis not present

## 2022-05-09 DIAGNOSIS — M25532 Pain in left wrist: Secondary | ICD-10-CM | POA: Diagnosis not present

## 2022-05-09 DIAGNOSIS — M79642 Pain in left hand: Secondary | ICD-10-CM | POA: Diagnosis not present

## 2022-05-09 NOTE — Telephone Encounter (Signed)
Attempted to contact patient. No answer. 

## 2022-05-09 NOTE — Telephone Encounter (Signed)
-----   Message from Derwood Kaplan, MD sent at 05/08/2022  4:23 PM EST ----- Regarding: call Tell her labs look good, blood counts back to normal, CA 27.29 staying normal

## 2022-05-12 ENCOUNTER — Telehealth: Payer: Self-pay

## 2022-05-12 NOTE — Telephone Encounter (Signed)
-----   Message from Derwood Kaplan, MD sent at 05/08/2022  4:23 PM EST ----- Regarding: call Tell her labs look good, blood counts back to normal, CA 27.29 staying normal

## 2022-05-12 NOTE — Telephone Encounter (Signed)
Called patient and notified her of lab results.

## 2022-05-13 DIAGNOSIS — M79642 Pain in left hand: Secondary | ICD-10-CM | POA: Diagnosis not present

## 2022-05-13 DIAGNOSIS — R202 Paresthesia of skin: Secondary | ICD-10-CM | POA: Diagnosis not present

## 2022-05-13 DIAGNOSIS — M62542 Muscle wasting and atrophy, not elsewhere classified, left hand: Secondary | ICD-10-CM | POA: Diagnosis not present

## 2022-05-13 DIAGNOSIS — M25532 Pain in left wrist: Secondary | ICD-10-CM | POA: Diagnosis not present

## 2022-05-15 ENCOUNTER — Other Ambulatory Visit (HOSPITAL_COMMUNITY): Payer: Self-pay

## 2022-05-15 DIAGNOSIS — M62542 Muscle wasting and atrophy, not elsewhere classified, left hand: Secondary | ICD-10-CM | POA: Diagnosis not present

## 2022-05-15 DIAGNOSIS — R202 Paresthesia of skin: Secondary | ICD-10-CM | POA: Diagnosis not present

## 2022-05-15 DIAGNOSIS — M25532 Pain in left wrist: Secondary | ICD-10-CM | POA: Diagnosis not present

## 2022-05-15 DIAGNOSIS — M79642 Pain in left hand: Secondary | ICD-10-CM | POA: Diagnosis not present

## 2022-05-16 ENCOUNTER — Ambulatory Visit
Admission: RE | Admit: 2022-05-16 | Discharge: 2022-05-16 | Disposition: A | Discharge status: Home / Self Care | Payer: Medicare Other | Source: Ambulatory Visit | Attending: Oncology | Admitting: Oncology

## 2022-05-16 ENCOUNTER — Other Ambulatory Visit: Payer: Self-pay | Admitting: Oncology

## 2022-05-16 DIAGNOSIS — Z1231 Encounter for screening mammogram for malignant neoplasm of breast: Secondary | ICD-10-CM | POA: Diagnosis not present

## 2022-05-19 DIAGNOSIS — M79642 Pain in left hand: Secondary | ICD-10-CM | POA: Diagnosis not present

## 2022-05-19 DIAGNOSIS — R202 Paresthesia of skin: Secondary | ICD-10-CM | POA: Diagnosis not present

## 2022-05-19 DIAGNOSIS — M25532 Pain in left wrist: Secondary | ICD-10-CM | POA: Diagnosis not present

## 2022-05-19 DIAGNOSIS — M62542 Muscle wasting and atrophy, not elsewhere classified, left hand: Secondary | ICD-10-CM | POA: Diagnosis not present

## 2022-05-21 DIAGNOSIS — R202 Paresthesia of skin: Secondary | ICD-10-CM | POA: Diagnosis not present

## 2022-05-21 DIAGNOSIS — M79642 Pain in left hand: Secondary | ICD-10-CM | POA: Diagnosis not present

## 2022-05-21 DIAGNOSIS — M25532 Pain in left wrist: Secondary | ICD-10-CM | POA: Diagnosis not present

## 2022-05-21 DIAGNOSIS — M62542 Muscle wasting and atrophy, not elsewhere classified, left hand: Secondary | ICD-10-CM | POA: Diagnosis not present

## 2022-05-22 DIAGNOSIS — J329 Chronic sinusitis, unspecified: Secondary | ICD-10-CM | POA: Diagnosis not present

## 2022-05-22 DIAGNOSIS — J40 Bronchitis, not specified as acute or chronic: Secondary | ICD-10-CM | POA: Diagnosis not present

## 2022-05-22 DIAGNOSIS — Z6831 Body mass index (BMI) 31.0-31.9, adult: Secondary | ICD-10-CM | POA: Diagnosis not present

## 2022-05-22 DIAGNOSIS — I10 Essential (primary) hypertension: Secondary | ICD-10-CM | POA: Diagnosis not present

## 2022-05-27 ENCOUNTER — Telehealth: Payer: Self-pay

## 2022-05-27 NOTE — Telephone Encounter (Signed)
-----   Message from Belva Chimes, LPN sent at [id number]  9:58 AM EST ----- Regarding: FW: call  ----- Message ----- From: Derwood Kaplan, MD Sent: 05/23/2022   6:46 PM EST To: Belva Chimes, LPN Subject: call                                           I see mammo is all clear

## 2022-05-27 NOTE — Telephone Encounter (Signed)
Message left on answering machine mammogram is clear, call with any questions.

## 2022-06-03 ENCOUNTER — Inpatient Hospital Stay: Payer: Medicare Other

## 2022-06-03 ENCOUNTER — Inpatient Hospital Stay: Payer: Medicare Other | Attending: Oncology | Admitting: Oncology

## 2022-06-03 ENCOUNTER — Encounter: Payer: Self-pay | Admitting: Oncology

## 2022-06-03 VITALS — BP 165/68 | HR 85 | Temp 98.0°F | Resp 18 | Ht 62.0 in | Wt 177.6 lb

## 2022-06-03 DIAGNOSIS — C7951 Secondary malignant neoplasm of bone: Secondary | ICD-10-CM | POA: Diagnosis not present

## 2022-06-03 DIAGNOSIS — Z5111 Encounter for antineoplastic chemotherapy: Secondary | ICD-10-CM | POA: Diagnosis not present

## 2022-06-03 DIAGNOSIS — M62542 Muscle wasting and atrophy, not elsewhere classified, left hand: Secondary | ICD-10-CM | POA: Diagnosis not present

## 2022-06-03 DIAGNOSIS — C50919 Malignant neoplasm of unspecified site of unspecified female breast: Secondary | ICD-10-CM | POA: Insufficient documentation

## 2022-06-03 DIAGNOSIS — C50811 Malignant neoplasm of overlapping sites of right female breast: Secondary | ICD-10-CM

## 2022-06-03 DIAGNOSIS — Z170 Estrogen receptor positive status [ER+]: Secondary | ICD-10-CM

## 2022-06-03 DIAGNOSIS — M79642 Pain in left hand: Secondary | ICD-10-CM | POA: Diagnosis not present

## 2022-06-03 DIAGNOSIS — D702 Other drug-induced agranulocytosis: Secondary | ICD-10-CM

## 2022-06-03 DIAGNOSIS — D519 Vitamin B12 deficiency anemia, unspecified: Secondary | ICD-10-CM | POA: Diagnosis not present

## 2022-06-03 DIAGNOSIS — R202 Paresthesia of skin: Secondary | ICD-10-CM | POA: Diagnosis not present

## 2022-06-03 DIAGNOSIS — M25532 Pain in left wrist: Secondary | ICD-10-CM | POA: Diagnosis not present

## 2022-06-03 LAB — CBC WITH DIFFERENTIAL (CANCER CENTER ONLY)
Abs Immature Granulocytes: 0.04 10*3/uL (ref 0.00–0.07)
Basophils Absolute: 0.1 10*3/uL (ref 0.0–0.1)
Basophils Relative: 2 %
Eosinophils Absolute: 0.1 10*3/uL (ref 0.0–0.5)
Eosinophils Relative: 2 %
HCT: 35.8 % — ABNORMAL LOW (ref 36.0–46.0)
Hemoglobin: 11.7 g/dL — ABNORMAL LOW (ref 12.0–15.0)
Immature Granulocytes: 1 %
Lymphocytes Relative: 25 %
Lymphs Abs: 0.9 10*3/uL (ref 0.7–4.0)
MCH: 29.5 pg (ref 26.0–34.0)
MCHC: 32.7 g/dL (ref 30.0–36.0)
MCV: 90.4 fL (ref 80.0–100.0)
Monocytes Absolute: 0.3 10*3/uL (ref 0.1–1.0)
Monocytes Relative: 10 %
Neutro Abs: 2 10*3/uL (ref 1.7–7.7)
Neutrophils Relative %: 60 %
Platelet Count: 183 10*3/uL (ref 150–400)
RBC: 3.96 MIL/uL (ref 3.87–5.11)
RDW: 16.9 % — ABNORMAL HIGH (ref 11.5–15.5)
WBC Count: 3.4 10*3/uL — ABNORMAL LOW (ref 4.0–10.5)
nRBC: 0 % (ref 0.0–0.2)

## 2022-06-03 LAB — CMP (CANCER CENTER ONLY)
ALT: 17 U/L (ref 0–44)
AST: 25 U/L (ref 15–41)
Albumin: 4 g/dL (ref 3.5–5.0)
Alkaline Phosphatase: 51 U/L (ref 38–126)
Anion gap: 8 (ref 5–15)
BUN: 14 mg/dL (ref 8–23)
CO2: 22 mmol/L (ref 22–32)
Calcium: 8.2 mg/dL — ABNORMAL LOW (ref 8.9–10.3)
Chloride: 103 mmol/L (ref 98–111)
Creatinine: 0.85 mg/dL (ref 0.44–1.00)
GFR, Estimated: >60 mL/min (ref >60)
Glucose, Bld: 173 mg/dL — ABNORMAL HIGH (ref 70–99)
Potassium: 3.8 mmol/L (ref 3.5–5.1)
Sodium: 133 mmol/L — ABNORMAL LOW (ref 135–145)
Total Bilirubin: 0.8 mg/dL (ref 0.3–1.2)
Total Protein: 6.5 g/dL (ref 6.5–8.1)

## 2022-06-03 NOTE — Progress Notes (Signed)
+ [facility]  [address] [place],  [place]  [address] [phone]  Clinic Day:  06/03/22    Referring physician: Bobbye Morton, *  ASSESSMENT & PLAN:   Invasive ductal carcinoma of the breast, originally diagnosed in 1997 and treated with right mastectomy. She was treated with high dose chemotherapy followed by stem cell transplantation. She completed 5 years of tamoxifen in November 2002.  She later developed liver metastases, ER and HER2 positive, November 2007. She was treated with Herceptin.  Bone metastases and subcutaneous nodules found in November 2007. Treated with surgery, radiation, and hormonal therapy.   Scalp lesions which were confirmed to be metastatic breast cancer, but HER2 negative in 2015. She was placed on anastrozole in 2015 and changed to letrozole in 2018. At that time she had multiple lytic and sclerotic bone metastases.  She had a metastatic lesion of the right anterior chest in August 2017 confirmed to be adenocarcinoma and treated with excision and radiation.  Mass of the lateral C3 vertebral body with collapse treated with radiation in February 2019. At that time the letrozole was stopped and she was placed on fulvestrant injections.  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 Her doses had to be adjusted and she is now down to 75 mg every other day for 21 days on and 7 days off. She continues the fulvestrant every 28 days as well as denosumab every 28 days. She has been stable on this regimen for over 5 years now.   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  Hypothyroidism, which has been evaluated with an ultrasound of the thyroid and she is on supplement.  Hard mass of the posterior left knee with induration and swelling of the calf. She has had a negative Doppler for DVT and has no evidence of a Baker's cyst. This does not appear to be infectious as there is  no heat or fluctuance, and did not respond to antibiotics.  I was concerned that this may represent metastatic breast cancer, but the MRI scan really does not support that, and the PET is negative.  This could be edema from her prior radiation but is quite indurated.  Dermatology has also seen and have performed a biopsy on November 28.  The pathology reveals dermal sclerosis and fat necrosis with no evidence of malignancy.  This is most consistent with radiation-induced fibrosis.  Plan: Her annual mammogram from February was clear. She will continue with her injections every 4 weeks and will get a basic metabolic profile for the next 2 times.  I will see her in 12 weeks with CBC, CMP and CA 27.29 for repeat evaluation.  She and her husband understand and agree with this plan of care. They know to call with any concerns regarding her breast cancer.   ADDENDUM: She has mild stable anemia with a hemoglobin of 11.7 and leukopenia with a white count of 3.4 and ANC of 2000.  The rest of her labs are normal.  Her white count previously had been normal in February.  We will continue to monitor this.  She is already on a reduced dose of the palbociclib.  I provided 20 minutes of face-to-face time during this this encounter and > 50% was spent counseling as documented under my assessment and plan.    Dellia Beckwith, MD [facility] AT [facility] [address] [place] [address] Dept: [phone] Dept  Fax: [phone]   CHIEF COMPLAINT:  CC: Metastatic breast cancer  Current Treatment:  Fulvestrant, Rivka Barbara and palbociclib   HISTORY OF PRESENT ILLNESS:  Jade Mooney is a 77 y.o. female who presents as a transfer of care from Dr. Ruthann Cancer for the continued treatment and management of metastatic breast cancer. She was originally diagnosed in 1997, stage III, and underwent right mastectomy. She did receive adjuvant chemotherapy  with Taxotere for 4 cycles, and also participated in a [facility] protocol with high-dose chemotherapy, cyclophosphamide/carboplatin/BCNU, followed by stem cell rescue in September 1997. She completed 5 years of tamoxifen in November 2002.  Unfortunately, she developed metastatic liver lesions which were biopsy proven metastatic breast cancer in November 2007. However, the tumor was estrogen receptor and HER2 positive, and so she was treated with Herceptin for a period of time.   In July 2015, she was found to have incidental findings of mixed lytic and sclerotic bony lesions on CT abdomen and pelvis. She underwent biopsy of the right iliac bone and surgical pathology confirmed invasive ductal carcinoma, grade 2, estrogen and progesterone receptor positive. Bone density from May 2015 was normal. She was started on zolendronic acid every 12 weeks in July 2015, but this was discontinued after January 2019. She was also found to have three scalp lesions, biopsied in July 2015 confirming metastatic breast cancer. HER2 negative. She was started on anastrozole in August 2015, and was switched to letrozole in April 2018. However, this was discontinued in February 2019 due to progression. She was started on fulvestrant in February 2019.  She developed a skin lesion of the right anterior chest wall in July 2015, and biopsy revealed adenocarcinoma. This was resected in August 2017, and she received radiation, completed in December 2018. Palbociclib was added in January 2018 after PET imaging revealed 2 new foci at T8 and the manubrium. She received palliative radiation to the cervical spine in February 2019. Palbociclib was held during that time and resumed at 75 mg daily once completed. Rivka Barbara was started in March 2019.     I have reviewed her chart and materials related to her cancer extensively and collaborated history with the patient. Summary of oncologic history is as follows: Oncology History  Breast cancer  metastasized to bone  10/12/2013 Initial Diagnosis   Breast cancer metastasized to bone (HCC)   02/02/2020 - 11/14/2021 Chemotherapy   Patient is on Treatment Plan : BREAST FULVESTRANT & XGEVA Q28D     Primary malignant neoplasm of breast with metastasis  07/12/2015 Initial Diagnosis   Metastatic breast cancer (HCC)   02/02/2020 - 11/14/2021 Chemotherapy   Patient is on Treatment Plan : BREAST FULVESTRANT & XGEVA Q28D     INTERVAL HISTORY:  Jade Mooney is here today for a routine follow up. Her mammogram from 05/20/2022 was clear. CA 27-29 was elevated in June of 2023 and has been normal ever since.  She is on physical therapy to strengthen the pelvic floor muscles.  Her  appetite is good, and her weight is increased 5 pounds.  She denies fever, chills or other signs of infection.  She denies nausea, vomiting, bowel issues, or abdominal pain.  She denies sore throat, cough, dyspnea, or chest pain.  HISTORY:   Past Medical History:  Diagnosis Date   Anxiety    Cancer [facility])    Breast 1997 right tx with mastectomy and chemo, metastatic now   GERD (gastroesophageal reflux disease)    Hypercholesterolemia    Hypertension    Hypothyroidism  Osteoarthritis    oa   Personal history of chemotherapy    Personal history of radiation therapy    Secondary malignant neoplasm of bone and bone marrow (HCC)    TIA (transient ischemic attack) last 09/10/20   x 3 total, they seem to occur every 5 years    Past Surgical History:  Procedure Laterality Date   CATARACT EXTRACTION, BILATERAL     CERVICAL FUSION  2020   MASTECTOMY MODIFIED RADICAL Right 1997   porta cath insertion  1997   later removed   radiation tx  finished 02-25-16   x 20 tx   TONSILLECTOMY     TOTAL HIP ARTHROPLASTY Left 03/12/2016   Procedure: LEFT TOTAL HIP ARTHROPLASTY ANTERIOR APPROACH;  Surgeon: Ollen Gross, MD;  Location: [facility];  Service: Orthopedics;  Laterality: Left;   TOTAL KNEE ARTHROPLASTY Left    TOTAL KNEE  ARTHROPLASTY Right 10/30/2021   Procedure: TOTAL KNEE ARTHROPLASTY;  Surgeon: Eugenia Mcalpine, MD;  Location: [facility];  Service: Orthopedics;  Laterality: Right;  adductor canal 120   TUBAL LIGATION      Family History  Problem Relation Age of Onset   Breast cancer Maternal Aunt     Social History:  reports that she has never smoked. She has never used smokeless tobacco. She reports current alcohol use. She reports that she does not use drugs.The patient is accompanied by her husband Jade Mooney today. She is married and lives at home with her spouse. They are planning a cruise. She is retired from work, and has never been exposed to chemicals or other toxic agents.  Allergies:  Allergies  Allergen Reactions   Other Nausea And Vomiting   Oxycodone-Acetaminophen Other (See Comments)   Oxycodone-Aspirin Other (See Comments)   Erythromycin Other (See Comments)   Oxycodone Other (See Comments)   Oxycodone Hcl Nausea And Vomiting   Percocet [Oxycodone-Acetaminophen] Nausea And Vomiting   Percodan [Oxycodone-Aspirin] Nausea And Vomiting   Tramadol Itching   Amoxicillin Hives    Has patient had a PCN reaction causing immediate rash, facial/tongue/throat swelling, SOB or lightheadedness with hypotension:unsure  Has patient had a PCN reaction causing severe rash involving mucus membranes or skin necrosis:No  Has patient had a PCN reaction that required hospitalization:No  Has patient had a PCN reaction occurring within the last 10 years: Yes  If all of the above answers are "NO", then may proceed with Cephalosporin use.  Has patient had a PCN reaction causing immediate rash, facial/tongue/throat swelling, SOB or lightheadedness with hypotension:unsure, Has patient had a PCN reaction causing severe rash involving mucus membranes or skin necrosis:No, Has patient had a PCN reaction that required hospitalization:No, Has patient had a PCN reaction occurring within the last 10 years: Yes, If all of the  above answers are "NO", then may proceed with Cephalosporin use.    Current Medications: Current Outpatient Medications  Medication Sig Dispense Refill   acetaminophen (TYLENOL) 500 MG tablet Take 1,000 mg by mouth every 6 (six) hours as needed for mild pain.     acidophilus (RISAQUAD) CAPS capsule Take 1 capsule by mouth daily.     ascorbic acid (VITAMIN C) 1000 MG tablet Take 1,000 mg by mouth daily.     aspirin 325 MG tablet Take 325 mg by mouth daily.     beta carotene 15 MG capsule Take 15 mg by mouth daily.     Biotin 5000 MCG TABS Take 5,000 mcg by mouth daily.     calcium citrate-vitamin D (CITRACAL+D)  315-200 MG-UNIT tablet Take 1 tablet by mouth in the morning and at bedtime.     cholecalciferol (VITAMIN D) 1000 units tablet Take 1,000 Units by mouth daily.     clopidogrel (PLAVIX) 75 MG tablet Take 1 tablet (75 mg total) by mouth daily.     cyanocobalamin (,VITAMIN B-12,) 1000 MCG/ML injection Inject 1,000 mcg into the muscle every 30 (thirty) days.     Denosumab (XGEVA [place]) Inject 1 Dose into the skin every 30 (thirty) days.     famotidine (PEPCID) 40 MG tablet Take 40 mg by mouth daily.     glucosamine-chondroitin 500-400 MG tablet Take 1 tablet by mouth daily.     ketotifen (ZADITOR) 0.025 % ophthalmic solution Place 1 drop into both eyes daily as needed (allergies).     levothyroxine (SYNTHROID, LEVOTHROID) 88 MCG tablet Take 88 mcg by mouth daily before breakfast.     LORazepam (ATIVAN) 1 MG tablet Take 1 tablet (1 mg total) by mouth every 8 (eight) hours as needed for anxiety. 30 tablet 0   Lutein 20 MG CAPS Take 20 mg by mouth daily.     meclizine (ANTIVERT) 25 MG tablet Take 25 mg by mouth 3 (three) times daily as needed for dizziness.     melatonin 5 MG TABS Take 5 mg by mouth at bedtime as needed.     metroNIDAZOLE (METROCREAM) 0.75 % cream Apply 1 Application topically daily.     Multiple Vitamins-Minerals (MULTIVITAMIN WITH MINERALS) tablet Take 1 tablet by mouth  daily.     Omega-3 1000 MG CAPS Take 1,000 mg by mouth in the morning and at bedtime.     palbociclib (IBRANCE) 75 MG tablet Take 1 tablet (75 mg total) by mouth daily. Take as directed by MD for 21 days on, 7 days off, repeat every 28 days. 21 tablet 5   pantoprazole (PROTONIX) 40 MG tablet Take 40 mg by mouth 2 (two) times daily. Before breakfast and before supper     Polyethyl Glycol-Propyl Glycol 0.4-0.3 % SOLN Place 1-2 drops into both eyes 2 (two) times daily.     rOPINIRole (REQUIP) 2 MG tablet Take 4 mg by mouth at bedtime.     rosuvastatin (CRESTOR) 20 MG tablet Take 1 tablet (20 mg total) by mouth daily.     vitamin E 180 MG (400 UNITS) capsule Take 400 Units by mouth daily.     zinc gluconate 50 MG tablet Take 50 mg by mouth daily.     No current facility-administered medications for this visit.    REVIEW OF SYSTEMS:  Review of Systems  Constitutional: Negative.  Negative for appetite change, chills, fatigue, fever and unexpected weight change.  HENT:  Negative.    Eyes: Negative.   Respiratory: Negative.  Negative for chest tightness, cough, hemoptysis, shortness of breath and wheezing.   Cardiovascular:  Positive for leg swelling (of the left medial leg near the popliteal fossa). Negative for chest pain and palpitations.       The posterior popliteal fossa has an increasing area of induration which is nontender with no obvious inflammation and looks similar to panniculitis to me.  She now also has a small nodule in the posterior leg just above the popliteal fossa.  Gastrointestinal: Negative.  Negative for abdominal distention, abdominal pain, blood in stool, constipation, diarrhea, nausea and vomiting.  Endocrine: Negative.   Genitourinary: Negative.  Negative for difficulty urinating, dysuria, frequency and hematuria.   Musculoskeletal:  Positive for arthralgias (diffuse). Negative for  back pain, flank pain, gait problem and myalgias.  Skin: Negative.   Neurological:  Negative.  Negative for dizziness, extremity weakness, gait problem, headaches, light-headedness, numbness, seizures and speech difficulty.  Hematological: Negative.   Psychiatric/Behavioral: Negative.  Negative for depression and sleep disturbance. The patient is not nervous/anxious.       VITALS:  Blood pressure (!) 165/68, pulse 85, temperature 98 F (36.7 C), temperature source Oral, resp. rate 18, height 5\' 2"  (1.575 m), weight 177 lb 9.6 oz (80.6 kg), SpO2 98 %.  Wt Readings from Last 3 Encounters:  06/05/22 177 lb (80.3 kg)  06/03/22 177 lb 9.6 oz (80.6 kg)  05/08/22 172 lb 12 oz (78.4 kg)    Body mass index is 32.48 kg/m.  Performance status (ECOG): 1 - Symptomatic but completely ambulatory  PHYSICAL EXAM:  Physical Exam Constitutional:      General: She is not in acute distress.    Appearance: Normal appearance. She is normal weight.  HENT:     Head: Normocephalic and atraumatic.  Eyes:     General: No scleral icterus.    Extraocular Movements: Extraocular movements intact.     Conjunctiva/sclera: Conjunctivae normal.     Pupils: Pupils are equal, round, and reactive to light.  Cardiovascular:     Rate and Rhythm: Normal rate and regular rhythm.     Pulses: Normal pulses.     Heart sounds: Normal heart sounds. No murmur heard.    No friction rub. No gallop.  Pulmonary:     Effort: Pulmonary effort is normal. No respiratory distress.     Breath sounds: Normal breath sounds.  Chest:     Comments: Right mastectomy is negative and left breast is without masses. Abdominal:     General: Bowel sounds are normal. There is no distension.     Palpations: Abdomen is soft. There is no hepatomegaly, splenomegaly or mass.     Tenderness: There is no abdominal tenderness.  Musculoskeletal:        General: Normal range of motion.     Cervical back: Normal range of motion and neck supple.     Right lower leg: No edema.     Left lower leg: No edema.     Comments: Mild  swelling and sever induration all around the posterior knee and wrapping around the sides.    Lymphadenopathy:     Cervical: No cervical adenopathy.  Skin:    General: Skin is warm and dry.  Neurological:     General: No focal deficit present.     Mental Status: She is alert and oriented to person, place, and time. Mental status is at baseline.  Psychiatric:        Mood and Affect: Mood normal.        Behavior: Behavior normal.        Thought Content: Thought content normal.        Judgment: Judgment normal.      LABS:      Latest Ref Rng & Units 07/01/2022   10:10 AM 06/03/2022   10:08 AM 05/06/2022   10:24 AM  CBC  WBC 4.0 - 10.5 K/uL 2.1  3.4  8.5   Hemoglobin 12.0 - 15.0 g/dL 32.4  40.1  02.7   Hematocrit 36.0 - 46.0 % 35.8  35.8  36.7   Platelets 150 - 400 K/uL 184  183  358       Latest Ref Rng & Units 07/01/2022   10:10 AM 06/03/2022  10:08 AM 05/06/2022   10:24 AM  CMP  Glucose 70 - 99 mg/dL 409  811  70   BUN 8 - 23 mg/dL 16  14  20    Creatinine 0.44 - 1.00 mg/dL 9.14  7.82  9.56   Sodium 135 - 145 mmol/L 135  133  134   Potassium 3.5 - 5.1 mmol/L 3.9  3.8  4.0   Chloride 98 - 111 mmol/L 104  103  98   CO2 22 - 32 mmol/L 24  22  25    Calcium 8.9 - 10.3 mg/dL 9.0  8.2  9.1   Total Protein 6.5 - 8.1 g/dL 6.6  6.5  7.1   Total Bilirubin 0.3 - 1.2 mg/dL 0.7  0.8  0.4   Alkaline Phos 38 - 126 U/L 50  51  48   AST 15 - 41 U/L 22  25  22    ALT 0 - 44 U/L 12  17  17       Lab Results  Component Value Date   LDH 138 01/17/2011   LDH 155 12/28/2009   LDH 155 12/29/2008    STUDIES:  EXAM:05/20/2022 DIGITAL SCREENING UNILATERAL LEFT MAMMOGRAM WITH CAD AND TOMOSYNTHESIS   TECHNIQUE: Left screening digital craniocaudal and mediolateral oblique mammograms were obtained. Left screening digital breast tomosynthesis was performed. The images were evaluated with computer-aided detection.   COMPARISON:  Previous exam(s).   ACR Breast Density Category c: The breasts  are heterogeneously dense, which may obscure small masses.   FINDINGS: There are no findings suspicious for malignancy.   IMPRESSION: No mammographic evidence of malignancy. A result letter of this screening mammogram will be mailed directly to the patient.   RECOMMENDATION: Screening mammogram in one year. (Code:SM-B-01Y)   BI-RADS CATEGORY  1: Negative.     NUCLEAR MEDICINE PET WHOLE BODY 06/25/21 TECHNIQUE:  14.7 mCi F-18 FDG was injected intravenously. Full-ring PET imaging  was performed from the head to foot after the radiotracer. CT data  was obtained and used for attenuation correction and anatomic  localization.  Fasting blood glucose: NA mg/dl  COMPARISON: PET-CT 21/30/8657. Lower leg MRI 03/29/2021.  FINDINGS:  Mediastinal blood pool activity: SUV max 2.7  HEAD/ NECK:  No hypermetabolic cervical lymph nodes are identified.There are no  lesions of the pharyngeal mucosal space. Mild diffuse thyroid  activity, improved from previous study and likely physiologic.  Incidental CT findings: none  CHEST:  There are no hypermetabolic mediastinal, hilar, axillary or internal  mammary lymph nodes. Small partially calcified subcutaneous nodule  medially in the left breast is again noted with low level  hypermetabolic activity, unchanged from the previous study. No  suspicious breast activity. There is also low level hypermetabolic  activity (SUV max 2.4) associated with a partially calcified lower  right paraspinal nodule (image 155/3), similar to previous study and  probably an osteophyte. No hypermetabolic pulmonary activity or  suspicious nodularity.  Incidental CT findings: Scattered pulmonary scarring bilaterally.  Mild aortic and coronary artery atherosclerosis. Previous right  mastectomy.  ABDOMEN/PELVIS:  There is no hypermetabolic activity within the liver, adrenal  glands, spleen or pancreas. There is no hypermetabolic nodal  activity.  Incidental CT findings:  Ill-defined low-density hepatic lesions are  grossly stable, without hypermetabolic activity. Small gallstones  are present. There are diverticular changes throughout the colon and  mild aortic atherosclerosis.  SKELETON:  There is no hypermetabolic activity to suggest metabolically active  recurrent metastatic disease. Widespread blastic metastatic disease  appears grossly  stable.  Incidental CT findings: As above, chronic widespread osseous  metastatic disease which appears grossly unchanged. Previous  cervical fusion and left total hip arthroplasty.  EXTREMITIES: There is no hypermetabolic activity within the bones to  suggest recurrent viable metastatic disease. Widespread treated  metastases are grossly stable. There are subcutaneous calcifications  posteromedially in the proximal left lower leg as seen on previous  MRI. These demonstrate low-level metabolic activity (SUV max 2.5).  No suspicious soft tissue masses.  Incidental CT findings: Status post left total knee arthroplasty.  Scattered vascular calcifications.  IMPRESSION:  1. No evidence of local recurrent breast cancer or viable distant  metastatic disease.  2. Grossly stable widespread blastic osseous metastatic disease,  without hypermetabolic activity consistent with chronic treated  disease.  3. As seen on previous lower leg MRI, there is subcutaneous soft  tissue thickening and calcification posteromedially in the proximal  left lower leg with low level hypermetabolic activity, likely  posttraumatic or postinflammatory.  4. Incidental findings including cholelithiasis, colonic  diverticulosis, coronary and Aortic Atherosclerosis (ICD10-I70.0).  Electronically Signed  By: Carey Bullocks M.D.  On: 06/26/2021 10:45      I,Gabriella Ballesteros,acting as a scribe for Dellia Beckwith, MD.,have documented all relevant documentation on the behalf of Dellia Beckwith, MD,as directed by  Dellia Beckwith, MD  while in the presence of Dellia Beckwith, MD.

## 2022-06-04 DIAGNOSIS — K219 Gastro-esophageal reflux disease without esophagitis: Secondary | ICD-10-CM | POA: Diagnosis not present

## 2022-06-04 DIAGNOSIS — Z86010 Personal history of colonic polyps: Secondary | ICD-10-CM | POA: Diagnosis not present

## 2022-06-04 LAB — CANCER ANTIGEN 27.29: CA 27.29: 24.5 U/mL (ref 0.0–38.6)

## 2022-06-05 ENCOUNTER — Inpatient Hospital Stay: Payer: Medicare Other

## 2022-06-05 ENCOUNTER — Encounter: Payer: Self-pay | Admitting: Oncology

## 2022-06-05 VITALS — BP 137/79 | HR 77 | Temp 97.9°F | Resp 18 | Ht 62.0 in | Wt 177.0 lb

## 2022-06-05 DIAGNOSIS — C50912 Malignant neoplasm of unspecified site of left female breast: Secondary | ICD-10-CM

## 2022-06-05 DIAGNOSIS — C50919 Malignant neoplasm of unspecified site of unspecified female breast: Secondary | ICD-10-CM

## 2022-06-05 DIAGNOSIS — Z5111 Encounter for antineoplastic chemotherapy: Secondary | ICD-10-CM | POA: Diagnosis not present

## 2022-06-05 DIAGNOSIS — C7951 Secondary malignant neoplasm of bone: Secondary | ICD-10-CM | POA: Diagnosis not present

## 2022-06-05 MED ORDER — FULVESTRANT 250 MG/5ML IM SOSY
500.0000 mg | PREFILLED_SYRINGE | Freq: Once | INTRAMUSCULAR | Status: AC
  Administered 2022-06-05: 500 mg via INTRAMUSCULAR
  Filled 2022-06-05: qty 10

## 2022-06-05 NOTE — Patient Instructions (Signed)

## 2022-06-05 NOTE — Progress Notes (Signed)
[place] ME THAT SHE HAD A SINUS INFECTION THAT PREVENTED HER FROM TAKING HER CALCIUM LIKE SHE NORMALLY DOES. SHE STATED THAT AT THAT TIME SHE FELT AWFUL. SHE ONLY REPORTED THAT SHE USUALLY TAKES A 400 MG CALCIUM PILL WITH VITAMIN D. I RECOMMENDED THAT SHE START TAKING A 600 MG CALCIUM PILL WITH VITAMIN D. SHE WILL TAKE CALCIUM 600 MG WITH VITAMIN D THREE TIMES DAILY UNTIL SHE COMES BACK NEXT MONTH. THEN IF THE LABS ARE  OKAY SHE WILL DROP IT BACK DOWN TO BID. SHE VERBALIZED UNDERSTANDING OF THE ABOVE INSTRUCTIONS.

## 2022-06-06 DIAGNOSIS — M79642 Pain in left hand: Secondary | ICD-10-CM | POA: Diagnosis not present

## 2022-06-06 DIAGNOSIS — M25532 Pain in left wrist: Secondary | ICD-10-CM | POA: Diagnosis not present

## 2022-06-06 DIAGNOSIS — M62542 Muscle wasting and atrophy, not elsewhere classified, left hand: Secondary | ICD-10-CM | POA: Diagnosis not present

## 2022-06-06 DIAGNOSIS — R202 Paresthesia of skin: Secondary | ICD-10-CM | POA: Diagnosis not present

## 2022-06-16 IMAGING — DX DG SCAPULA*R*
2 series · 2 of 2 positions shown · non-contrast
Comparison: None.

CLINICAL DATA: Metastatic breast cancer.  New right shoulder pain.

EXAM:
RIGHT SCAPULA - 2+ VIEWS

[scapula ap]
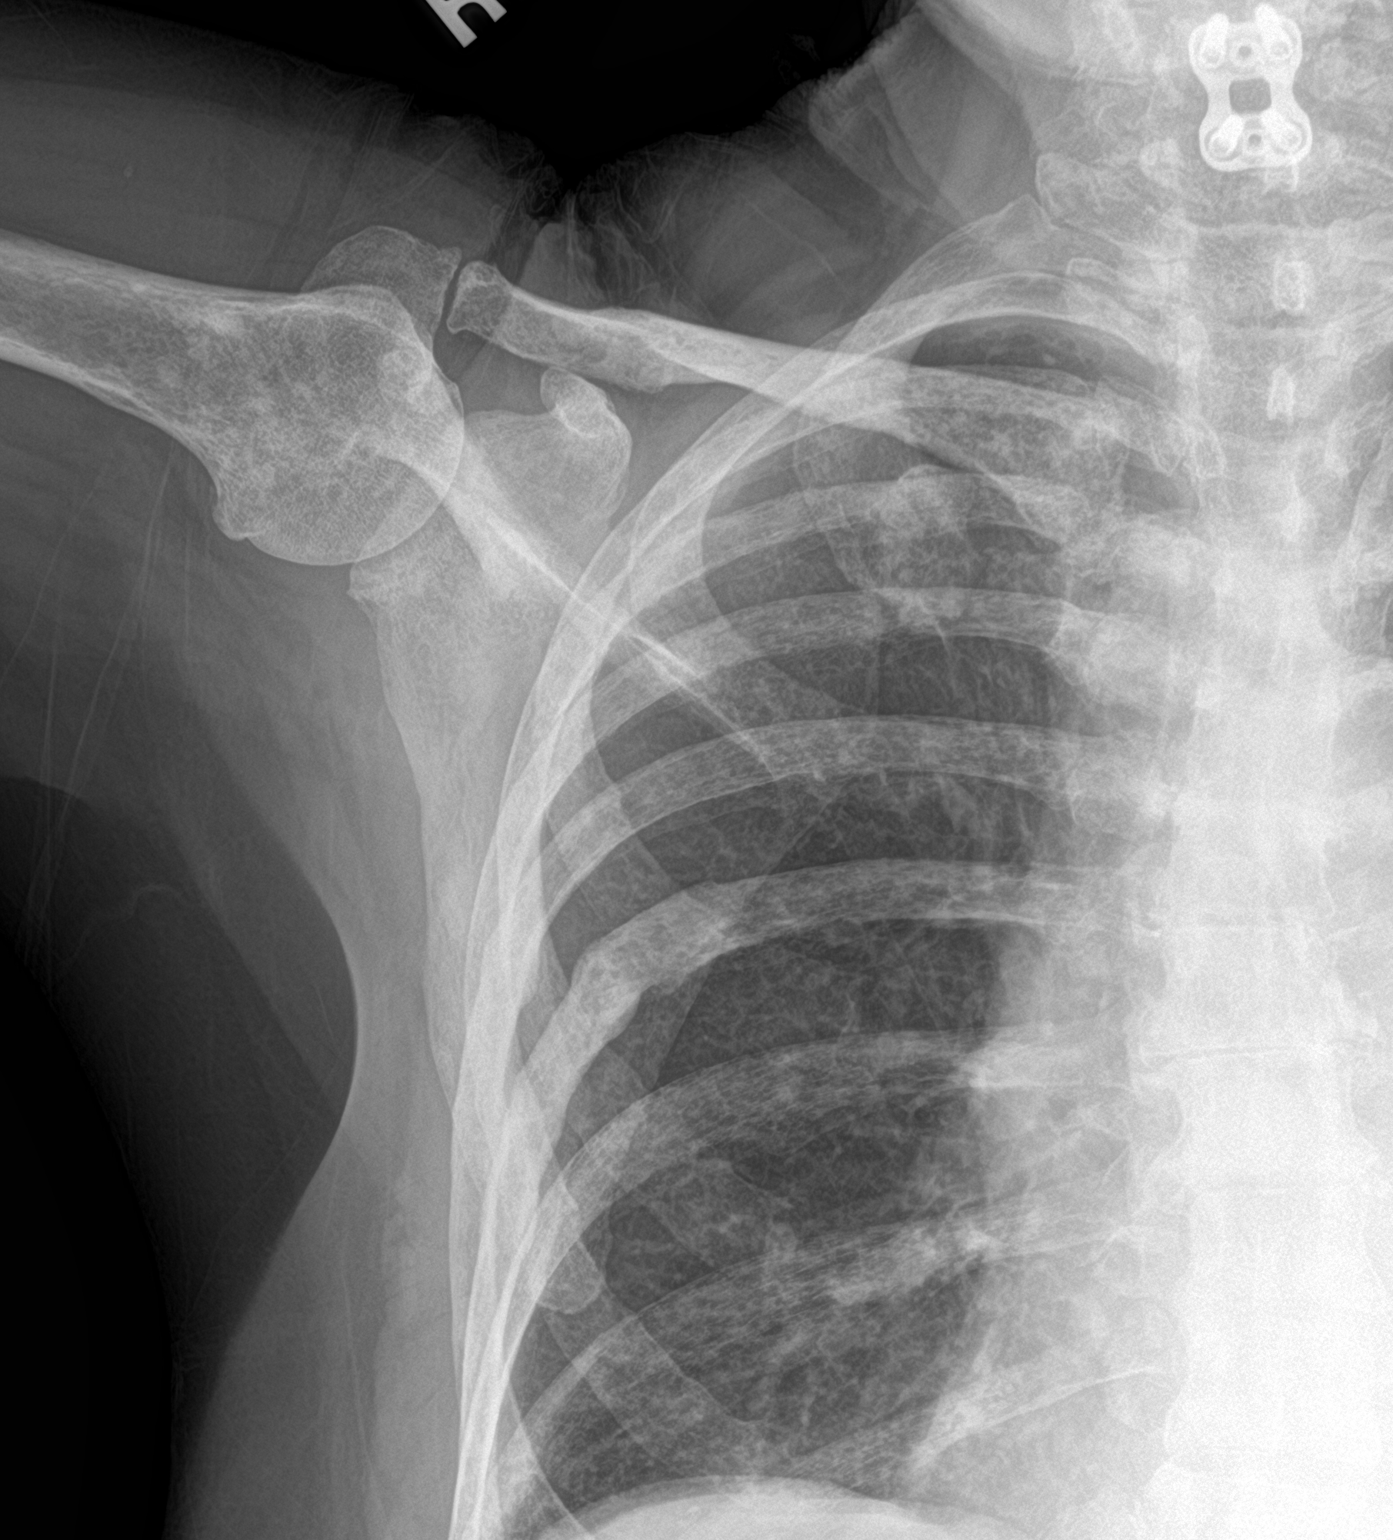

[scapula lat]
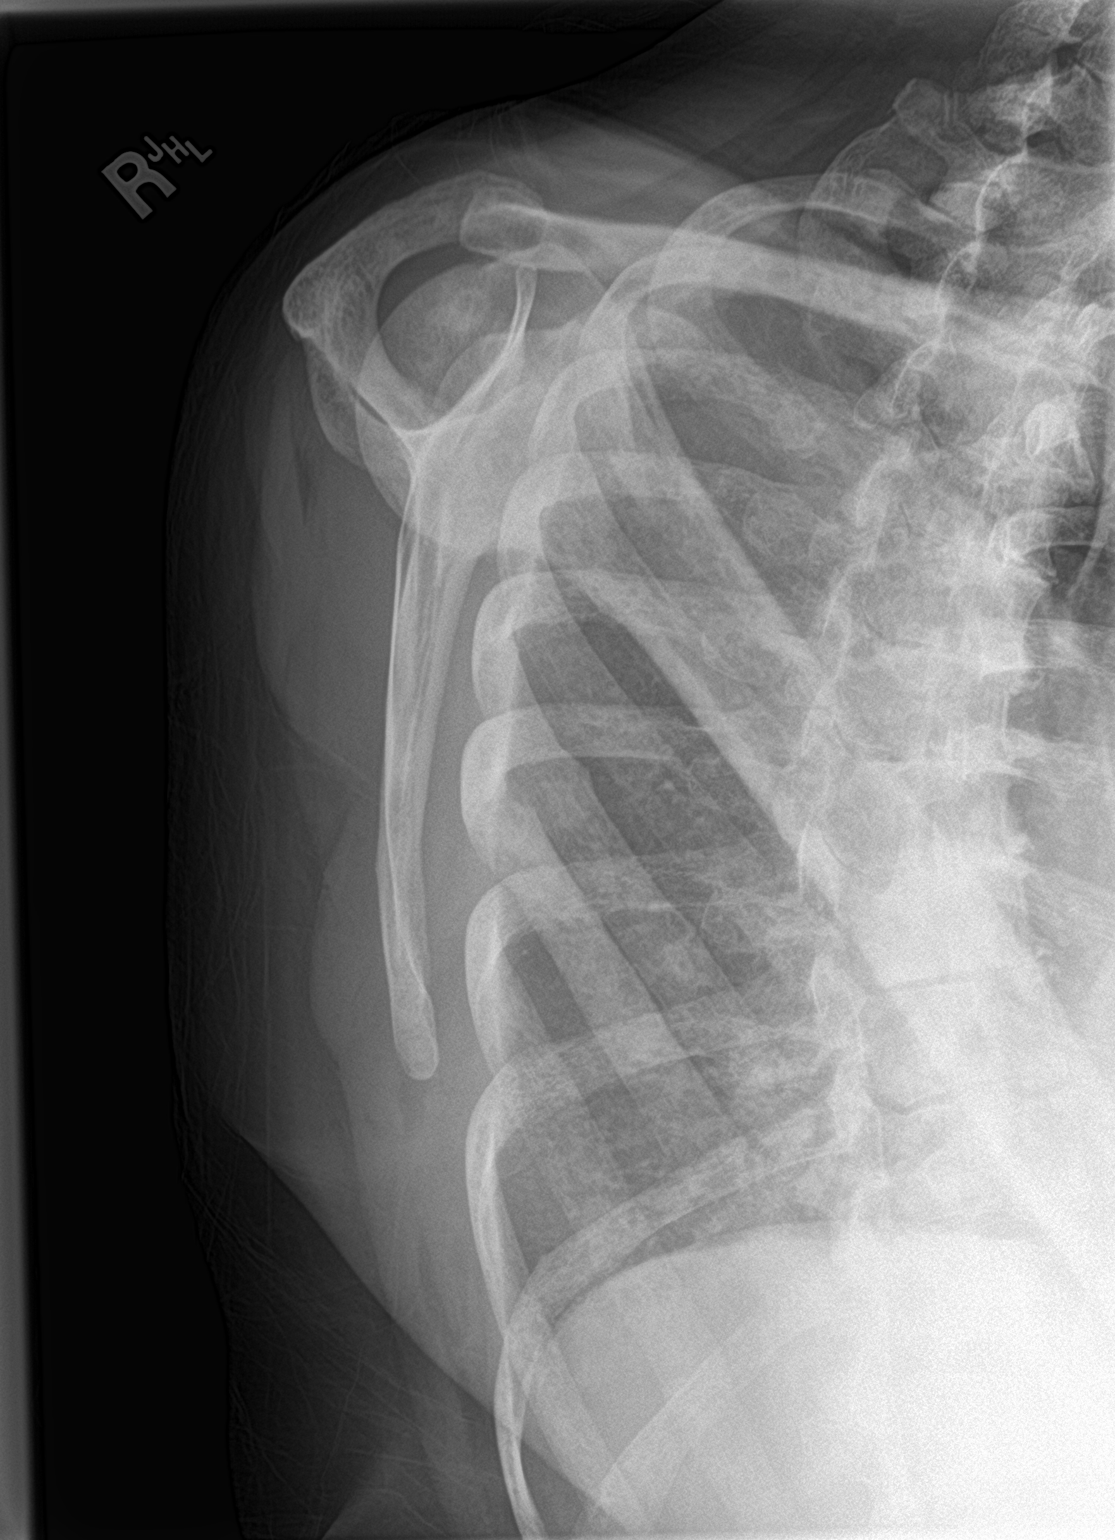

[2 of 2 positions shown; findings below may reference images not displayed]

FINDINGS: There is no evidence of fracture. Sclerotic densities are seen in
the visualized right ribs and proximal right humerus consistent with
metastatic disease. Soft tissues are unremarkable.
IMPRESSION: No fracture or dislocation is noted. Sclerotic densities are seen in
visualized right ribs and proximal right humerus consistent with
metastatic disease.

## 2022-06-17 DIAGNOSIS — M25532 Pain in left wrist: Secondary | ICD-10-CM | POA: Diagnosis not present

## 2022-06-17 DIAGNOSIS — M79642 Pain in left hand: Secondary | ICD-10-CM | POA: Diagnosis not present

## 2022-06-17 DIAGNOSIS — R202 Paresthesia of skin: Secondary | ICD-10-CM | POA: Diagnosis not present

## 2022-06-17 DIAGNOSIS — M62542 Muscle wasting and atrophy, not elsewhere classified, left hand: Secondary | ICD-10-CM | POA: Diagnosis not present

## 2022-06-19 ENCOUNTER — Other Ambulatory Visit (HOSPITAL_COMMUNITY): Payer: Self-pay

## 2022-06-19 DIAGNOSIS — M25532 Pain in left wrist: Secondary | ICD-10-CM | POA: Diagnosis not present

## 2022-06-19 DIAGNOSIS — R202 Paresthesia of skin: Secondary | ICD-10-CM | POA: Diagnosis not present

## 2022-06-19 DIAGNOSIS — M79642 Pain in left hand: Secondary | ICD-10-CM | POA: Diagnosis not present

## 2022-06-19 DIAGNOSIS — M62542 Muscle wasting and atrophy, not elsewhere classified, left hand: Secondary | ICD-10-CM | POA: Diagnosis not present

## 2022-06-23 DIAGNOSIS — M62542 Muscle wasting and atrophy, not elsewhere classified, left hand: Secondary | ICD-10-CM | POA: Diagnosis not present

## 2022-06-23 DIAGNOSIS — M25532 Pain in left wrist: Secondary | ICD-10-CM | POA: Diagnosis not present

## 2022-06-23 DIAGNOSIS — R202 Paresthesia of skin: Secondary | ICD-10-CM | POA: Diagnosis not present

## 2022-06-23 DIAGNOSIS — M79642 Pain in left hand: Secondary | ICD-10-CM | POA: Diagnosis not present

## 2022-06-24 DIAGNOSIS — R202 Paresthesia of skin: Secondary | ICD-10-CM | POA: Diagnosis not present

## 2022-06-24 DIAGNOSIS — M62542 Muscle wasting and atrophy, not elsewhere classified, left hand: Secondary | ICD-10-CM | POA: Diagnosis not present

## 2022-06-24 DIAGNOSIS — M25532 Pain in left wrist: Secondary | ICD-10-CM | POA: Diagnosis not present

## 2022-06-24 DIAGNOSIS — M79642 Pain in left hand: Secondary | ICD-10-CM | POA: Diagnosis not present

## 2022-06-27 DIAGNOSIS — Z4789 Encounter for other orthopedic aftercare: Secondary | ICD-10-CM | POA: Diagnosis not present

## 2022-07-01 ENCOUNTER — Inpatient Hospital Stay: Payer: Medicare Other | Attending: Oncology

## 2022-07-01 ENCOUNTER — Encounter: Payer: Self-pay | Admitting: Oncology

## 2022-07-01 DIAGNOSIS — C7951 Secondary malignant neoplasm of bone: Secondary | ICD-10-CM | POA: Insufficient documentation

## 2022-07-01 DIAGNOSIS — C50919 Malignant neoplasm of unspecified site of unspecified female breast: Secondary | ICD-10-CM | POA: Diagnosis not present

## 2022-07-01 DIAGNOSIS — R202 Paresthesia of skin: Secondary | ICD-10-CM | POA: Diagnosis not present

## 2022-07-01 DIAGNOSIS — C787 Secondary malignant neoplasm of liver and intrahepatic bile duct: Secondary | ICD-10-CM | POA: Insufficient documentation

## 2022-07-01 DIAGNOSIS — D519 Vitamin B12 deficiency anemia, unspecified: Secondary | ICD-10-CM | POA: Diagnosis not present

## 2022-07-01 DIAGNOSIS — M25532 Pain in left wrist: Secondary | ICD-10-CM | POA: Diagnosis not present

## 2022-07-01 DIAGNOSIS — D702 Other drug-induced agranulocytosis: Secondary | ICD-10-CM | POA: Insufficient documentation

## 2022-07-01 DIAGNOSIS — Z170 Estrogen receptor positive status [ER+]: Secondary | ICD-10-CM

## 2022-07-01 DIAGNOSIS — Z5111 Encounter for antineoplastic chemotherapy: Secondary | ICD-10-CM | POA: Diagnosis not present

## 2022-07-01 DIAGNOSIS — M79642 Pain in left hand: Secondary | ICD-10-CM | POA: Diagnosis not present

## 2022-07-01 DIAGNOSIS — M62542 Muscle wasting and atrophy, not elsewhere classified, left hand: Secondary | ICD-10-CM | POA: Diagnosis not present

## 2022-07-01 LAB — CMP (CANCER CENTER ONLY)
ALT: 12 U/L (ref 0–44)
AST: 22 U/L (ref 15–41)
Albumin: 4.2 g/dL (ref 3.5–5.0)
Alkaline Phosphatase: 50 U/L (ref 38–126)
Anion gap: 7 (ref 5–15)
BUN: 16 mg/dL (ref 8–23)
CO2: 24 mmol/L (ref 22–32)
Calcium: 9 mg/dL (ref 8.9–10.3)
Chloride: 104 mmol/L (ref 98–111)
Creatinine: 0.9 mg/dL (ref 0.44–1.00)
GFR, Estimated: >60 mL/min (ref >60)
Glucose, Bld: 115 mg/dL — ABNORMAL HIGH (ref 70–99)
Potassium: 3.9 mmol/L (ref 3.5–5.1)
Sodium: 135 mmol/L (ref 135–145)
Total Bilirubin: 0.7 mg/dL (ref 0.3–1.2)
Total Protein: 6.6 g/dL (ref 6.5–8.1)

## 2022-07-01 LAB — CBC WITH DIFFERENTIAL (CANCER CENTER ONLY)
Abs Immature Granulocytes: 0.01 10*3/uL (ref 0.00–0.07)
Basophils Absolute: 0.1 10*3/uL (ref 0.0–0.1)
Basophils Relative: 2 %
Eosinophils Absolute: 0 10*3/uL (ref 0.0–0.5)
Eosinophils Relative: 1 %
HCT: 35.8 % — ABNORMAL LOW (ref 36.0–46.0)
Hemoglobin: 11.8 g/dL — ABNORMAL LOW (ref 12.0–15.0)
Immature Granulocytes: 1 %
Lymphocytes Relative: 40 %
Lymphs Abs: 0.9 10*3/uL (ref 0.7–4.0)
MCH: 30 pg (ref 26.0–34.0)
MCHC: 33 g/dL (ref 30.0–36.0)
MCV: 91.1 fL (ref 80.0–100.0)
Monocytes Absolute: 0.3 10*3/uL (ref 0.1–1.0)
Monocytes Relative: 14 %
Neutro Abs: 0.9 10*3/uL — ABNORMAL LOW (ref 1.7–7.7)
Neutrophils Relative %: 42 %
Platelet Count: 184 10*3/uL (ref 150–400)
RBC: 3.93 MIL/uL (ref 3.87–5.11)
RDW: 16.4 % — ABNORMAL HIGH (ref 11.5–15.5)
WBC Count: 2.1 10*3/uL — ABNORMAL LOW (ref 4.0–10.5)
nRBC: 0 % (ref 0.0–0.2)

## 2022-07-02 LAB — CANCER ANTIGEN 27.29: CA 27.29: 41.1 U/mL — ABNORMAL HIGH (ref 0.0–38.6)

## 2022-07-03 ENCOUNTER — Other Ambulatory Visit (HOSPITAL_COMMUNITY): Payer: Self-pay

## 2022-07-03 ENCOUNTER — Inpatient Hospital Stay: Payer: Medicare Other

## 2022-07-03 VITALS — BP 176/76 | HR 72 | Temp 97.7°F | Resp 20 | Ht 62.0 in | Wt 175.0 lb

## 2022-07-03 DIAGNOSIS — M79642 Pain in left hand: Secondary | ICD-10-CM | POA: Diagnosis not present

## 2022-07-03 DIAGNOSIS — C7951 Secondary malignant neoplasm of bone: Secondary | ICD-10-CM | POA: Diagnosis not present

## 2022-07-03 DIAGNOSIS — Z170 Estrogen receptor positive status [ER+]: Secondary | ICD-10-CM | POA: Diagnosis not present

## 2022-07-03 DIAGNOSIS — M62542 Muscle wasting and atrophy, not elsewhere classified, left hand: Secondary | ICD-10-CM | POA: Diagnosis not present

## 2022-07-03 DIAGNOSIS — M25532 Pain in left wrist: Secondary | ICD-10-CM | POA: Diagnosis not present

## 2022-07-03 DIAGNOSIS — C50919 Malignant neoplasm of unspecified site of unspecified female breast: Secondary | ICD-10-CM | POA: Diagnosis not present

## 2022-07-03 DIAGNOSIS — R202 Paresthesia of skin: Secondary | ICD-10-CM | POA: Diagnosis not present

## 2022-07-03 DIAGNOSIS — C787 Secondary malignant neoplasm of liver and intrahepatic bile duct: Secondary | ICD-10-CM | POA: Diagnosis not present

## 2022-07-03 DIAGNOSIS — C50912 Malignant neoplasm of unspecified site of left female breast: Secondary | ICD-10-CM

## 2022-07-03 DIAGNOSIS — Z5111 Encounter for antineoplastic chemotherapy: Secondary | ICD-10-CM | POA: Diagnosis not present

## 2022-07-03 MED ORDER — DENOSUMAB 120 MG/1.7ML ~~LOC~~ SOLN
120.0000 mg | Freq: Once | SUBCUTANEOUS | Status: AC
  Administered 2022-07-03: 120 mg via SUBCUTANEOUS
  Filled 2022-07-03: qty 1.7

## 2022-07-03 MED ORDER — FULVESTRANT 250 MG/5ML IM SOSY
500.0000 mg | PREFILLED_SYRINGE | Freq: Once | INTRAMUSCULAR | Status: AC
  Administered 2022-07-03: 500 mg via INTRAMUSCULAR
  Filled 2022-07-03: qty 10

## 2022-07-03 NOTE — Patient Instructions (Signed)
Denosumab Injection (Oncology) What is this medication? DENOSUMAB (den oh SUE mab) prevents weakened bones caused by cancer. It may also be used to treat noncancerous bone tumors that cannot be removed by surgery. It can also be used to treat high calcium levels in the blood caused by cancer. It works by blocking a protein that causes bones to break down quickly. This slows down the release of calcium from bones, which lowers calcium levels in your blood. It also makes your bones stronger and less likely to break (fracture). This medicine may be used for other purposes; ask your health care provider or pharmacist if you have questions. COMMON BRAND NAME(S): XGEVA What should I tell my care team before I take this medication? They need to know if you have any of these conditions: Dental disease Having surgery or tooth extraction Infection Kidney disease Low levels of calcium or vitamin D in the blood Malnutrition On hemodialysis Skin conditions or sensitivity Thyroid or parathyroid disease An unusual reaction to denosumab, other medications, foods, dyes, or preservatives Pregnant or trying to get pregnant Breast-feeding How should I use this medication? This medication is for injection under the skin. It is given by your care team in a hospital or clinic setting. A special MedGuide will be given to you before each treatment. Be sure to read this information carefully each time. Talk to your care team about the use of this medication in children. While it may be prescribed for children as young as 13 years for selected conditions, precautions do apply. Overdosage: If you think you have taken too much of this medicine contact a poison control center or emergency room at once. NOTE: This medicine is only for you. Do not share this medicine with others. What if I miss a dose? Keep appointments for follow-up doses. It is important not to miss your dose. Call your care team if you are unable to  keep an appointment. What may interact with this medication? Do not take this medication with any of the following: Other medications containing denosumab This medication may also interact with the following: Medications that lower your chance of fighting infection Steroid medications, such as prednisone or cortisone This list may not describe all possible interactions. Give your health care provider a list of all the medicines, herbs, non-prescription drugs, or dietary supplements you use. Also tell them if you smoke, drink alcohol, or use illegal drugs. Some items may interact with your medicine. What should I watch for while using this medication? Your condition will be monitored carefully while you are receiving this medication. You may need blood work while taking this medication. This medication may increase your risk of getting an infection. Call your care team for advice if you get a fever, chills, sore throat, or other symptoms of a cold or flu. Do not treat yourself. Try to avoid being around people who are sick. You should make sure you get enough calcium and vitamin D while you are taking this medication, unless your care team tells you not to. Discuss the foods you eat and the vitamins you take with your care team. Some people who take this medication have severe bone, joint, or muscle pain. This medication may also increase your risk for jaw problems or a broken thigh bone. Tell your care team right away if you have severe pain in your jaw, bones, joints, or muscles. Tell your care team if you have any pain that does not go away or that gets worse. Talk   to your care team if you may be pregnant. Serious birth defects can occur if you take this medication during pregnancy and for 5 months after the last dose. You will need a negative pregnancy test before starting this medication. Contraception is recommended while taking this medication and for 5 months after the last dose. Your care team  can help you find the option that works for you. What side effects may I notice from receiving this medication? Side effects that you should report to your care team as soon as possible: Allergic reactions--skin rash, itching, hives, swelling of the face, lips, tongue, or throat Bone, joint, or muscle pain Low calcium level--muscle pain or cramps, confusion, tingling, or numbness in the hands or feet Osteonecrosis of the jaw--pain, swelling, or redness in the mouth, numbness of the jaw, poor healing after dental work, unusual discharge from the mouth, visible bones in the mouth Side effects that usually do not require medical attention (report to your care team if they continue or are bothersome): Cough Diarrhea Fatigue Headache Nausea This list may not describe all possible side effects. Call your doctor for medical advice about side effects. You may report side effects to FDA at 1-800-FDA-1088. Where should I keep my medication? This medication is given in a hospital or clinic. It will not be stored at home. NOTE: This sheet is a summary. It may not cover all possible information. If you have questions about this medicine, talk to your doctor, pharmacist, or health care provider.  2023 Elsevier/Gold Standard (2021-07-29 00:00:00) Fulvestrant Injection What is this medication? FULVESTRANT (ful VES trant) treats breast cancer. It works by blocking the hormone estrogen in breast tissue, which prevents breast cancer cells from spreading or growing. This medicine may be used for other purposes; ask your health care provider or pharmacist if you have questions. COMMON BRAND NAME(S): FASLODEX What should I tell my care team before I take this medication? They need to know if you have any of these conditions: Bleeding disorder Liver disease Low blood cell levels, such as low white cells, red cells, and platelets An unusual or allergic reaction to fulvestrant, other medications, foods, dyes, or  preservatives Pregnant or trying to get pregnant Breast-feeding How should I use this medication? This medication is injected into a muscle. It is given by your care team in a hospital or clinic setting. Talk to your care team about the use of this medication in children. Special care may be needed. Overdosage: If you think you have taken too much of this medicine contact a poison control center or emergency room at once. NOTE: This medicine is only for you. Do not share this medicine with others. What if I miss a dose? Keep appointments for follow-up doses. It is important not to miss your dose. Call your care team if you are unable to keep an appointment. What may interact with this medication? Certain medications that prevent or treat blood clots, such as warfarin, enoxaparin, dalteparin, apixaban, dabigatran, rivaroxaban This list may not describe all possible interactions. Give your health care provider a list of all the medicines, herbs, non-prescription drugs, or dietary supplements you use. Also tell them if you smoke, drink alcohol, or use illegal drugs. Some items may interact with your medicine. What should I watch for while using this medication? Your condition will be monitored carefully while you are receiving this medication. You may need blood work while taking this medication. Talk to your care team if you may be pregnant. Serious   birth defects can occur if you take this medication during pregnancy and for 1 year after the last dose. You will need a negative pregnancy test before starting this medication. Contraception is recommended while taking this medication and for 1 year after the last dose. Your care team can help you find the option that works for you. Do not breastfeed while taking this medication and for 1 year after the last dose. This medication may cause infertility. Talk to your care team if you are concerned about your fertility. What side effects may I notice from  receiving this medication? Side effects that you should report to your care team as soon as possible: Allergic reactions or angioedema--skin rash, itching or hives, swelling of the face, eyes, lips, tongue, arms, or legs, trouble swallowing or breathing Pain, tingling, or numbness in the hands or feet Side effects that usually do not require medical attention (report to your care team if they continue or are bothersome): Bone, joint, or muscle pain Constipation Headache Hot flashes Nausea Pain, redness, or irritation at injection site Unusual weakness or fatigue This list may not describe all possible side effects. Call your doctor for medical advice about side effects. You may report side effects to FDA at 1-800-FDA-1088. Where should I keep my medication? This medication is given in a hospital or clinic. It will not be stored at home. NOTE: This sheet is a summary. It may not cover all possible information. If you have questions about this medicine, talk to your doctor, pharmacist, or health care provider.  2023 Elsevier/Gold Standard (2007-05-01 00:00:00)  

## 2022-07-05 ENCOUNTER — Encounter: Payer: Self-pay | Admitting: Oncology

## 2022-07-07 DIAGNOSIS — R932 Abnormal findings on diagnostic imaging of liver and biliary tract: Secondary | ICD-10-CM | POA: Diagnosis not present

## 2022-07-07 DIAGNOSIS — Z86010 Personal history of colonic polyps: Secondary | ICD-10-CM | POA: Diagnosis not present

## 2022-07-07 DIAGNOSIS — K219 Gastro-esophageal reflux disease without esophagitis: Secondary | ICD-10-CM | POA: Diagnosis not present

## 2022-07-10 ENCOUNTER — Inpatient Hospital Stay: Payer: Medicare Other

## 2022-07-10 DIAGNOSIS — Z170 Estrogen receptor positive status [ER+]: Secondary | ICD-10-CM | POA: Diagnosis not present

## 2022-07-10 DIAGNOSIS — C50919 Malignant neoplasm of unspecified site of unspecified female breast: Secondary | ICD-10-CM | POA: Diagnosis not present

## 2022-07-10 DIAGNOSIS — C787 Secondary malignant neoplasm of liver and intrahepatic bile duct: Secondary | ICD-10-CM | POA: Diagnosis not present

## 2022-07-10 DIAGNOSIS — C7951 Secondary malignant neoplasm of bone: Secondary | ICD-10-CM | POA: Diagnosis not present

## 2022-07-10 DIAGNOSIS — Z5111 Encounter for antineoplastic chemotherapy: Secondary | ICD-10-CM | POA: Diagnosis not present

## 2022-07-10 LAB — CBC WITH DIFFERENTIAL (CANCER CENTER ONLY)
Abs Immature Granulocytes: 0.02 10*3/uL (ref 0.00–0.07)
Basophils Absolute: 0 10*3/uL (ref 0.0–0.1)
Basophils Relative: 1 %
Eosinophils Absolute: 0.1 10*3/uL (ref 0.0–0.5)
Eosinophils Relative: 2 %
HCT: 33.7 % — ABNORMAL LOW (ref 36.0–46.0)
Hemoglobin: 11.1 g/dL — ABNORMAL LOW (ref 12.0–15.0)
Immature Granulocytes: 1 %
Lymphocytes Relative: 23 %
Lymphs Abs: 0.7 10*3/uL (ref 0.7–4.0)
MCH: 29.8 pg (ref 26.0–34.0)
MCHC: 32.9 g/dL (ref 30.0–36.0)
MCV: 90.6 fL (ref 80.0–100.0)
Monocytes Absolute: 0.4 10*3/uL (ref 0.1–1.0)
Monocytes Relative: 14 %
Neutro Abs: 1.8 10*3/uL (ref 1.7–7.7)
Neutrophils Relative %: 59 %
Platelet Count: 180 10*3/uL (ref 150–400)
RBC: 3.72 MIL/uL — ABNORMAL LOW (ref 3.87–5.11)
RDW: 16.4 % — ABNORMAL HIGH (ref 11.5–15.5)
WBC Count: 3 10*3/uL — ABNORMAL LOW (ref 4.0–10.5)
nRBC: 0 % (ref 0.0–0.2)

## 2022-07-10 LAB — CMP (CANCER CENTER ONLY)
ALT: 12 U/L (ref 0–44)
AST: 20 U/L (ref 15–41)
Albumin: 3.8 g/dL (ref 3.5–5.0)
Alkaline Phosphatase: 45 U/L (ref 38–126)
Anion gap: 10 (ref 5–15)
BUN: 11 mg/dL (ref 8–23)
CO2: 22 mmol/L (ref 22–32)
Calcium: 8.3 mg/dL — ABNORMAL LOW (ref 8.9–10.3)
Chloride: 103 mmol/L (ref 98–111)
Creatinine: 0.78 mg/dL (ref 0.44–1.00)
GFR, Estimated: >60 mL/min (ref >60)
Glucose, Bld: 127 mg/dL — ABNORMAL HIGH (ref 70–99)
Potassium: 3.8 mmol/L (ref 3.5–5.1)
Sodium: 135 mmol/L (ref 135–145)
Total Bilirubin: 0.6 mg/dL (ref 0.3–1.2)
Total Protein: 6.4 g/dL — ABNORMAL LOW (ref 6.5–8.1)

## 2022-07-11 ENCOUNTER — Encounter: Payer: Self-pay | Admitting: Oncology

## 2022-07-11 LAB — CANCER ANTIGEN 27.29: CA 27.29: 24.4 U/mL (ref 0.0–38.6)

## 2022-07-15 ENCOUNTER — Other Ambulatory Visit (HOSPITAL_COMMUNITY): Payer: Self-pay

## 2022-07-21 ENCOUNTER — Telehealth: Payer: Self-pay

## 2022-07-21 NOTE — Telephone Encounter (Signed)
-----   Message from Dellia Beckwith, MD sent at 07/18/2022  6:49 PM EDT ----- Regarding: call I see the repeat CA 27.29 came back normal, I can't explain it, but that's good

## 2022-07-21 NOTE — Telephone Encounter (Signed)
Patient returned call and was notified of message. 

## 2022-07-21 NOTE — Telephone Encounter (Signed)
Attempted to contact patient. No answer. 

## 2022-07-21 NOTE — Telephone Encounter (Signed)
-----   Message from Christine H McCarty, MD sent at 07/18/2022  6:49 PM EDT ----- Regarding: call I see the repeat CA 27.29 came back normal, I can't explain it, but that's good  

## 2022-07-23 ENCOUNTER — Other Ambulatory Visit (HOSPITAL_COMMUNITY): Payer: Self-pay

## 2022-07-29 ENCOUNTER — Other Ambulatory Visit: Payer: Self-pay | Admitting: Pharmacist

## 2022-07-29 ENCOUNTER — Inpatient Hospital Stay: Payer: Medicare Other | Attending: Oncology

## 2022-07-29 DIAGNOSIS — Z5111 Encounter for antineoplastic chemotherapy: Secondary | ICD-10-CM | POA: Insufficient documentation

## 2022-07-29 DIAGNOSIS — C787 Secondary malignant neoplasm of liver and intrahepatic bile duct: Secondary | ICD-10-CM | POA: Diagnosis not present

## 2022-07-29 DIAGNOSIS — C50811 Malignant neoplasm of overlapping sites of right female breast: Secondary | ICD-10-CM

## 2022-07-29 DIAGNOSIS — Z170 Estrogen receptor positive status [ER+]: Secondary | ICD-10-CM | POA: Insufficient documentation

## 2022-07-29 DIAGNOSIS — C7951 Secondary malignant neoplasm of bone: Secondary | ICD-10-CM | POA: Diagnosis not present

## 2022-07-29 DIAGNOSIS — Z79899 Other long term (current) drug therapy: Secondary | ICD-10-CM | POA: Insufficient documentation

## 2022-07-29 DIAGNOSIS — C50919 Malignant neoplasm of unspecified site of unspecified female breast: Secondary | ICD-10-CM | POA: Diagnosis not present

## 2022-07-29 DIAGNOSIS — D519 Vitamin B12 deficiency anemia, unspecified: Secondary | ICD-10-CM | POA: Diagnosis not present

## 2022-07-29 LAB — CBC WITH DIFFERENTIAL (CANCER CENTER ONLY)
Abs Immature Granulocytes: 0.02 10*3/uL (ref 0.00–0.07)
Basophils Absolute: 0.1 10*3/uL (ref 0.0–0.1)
Basophils Relative: 2 %
Eosinophils Absolute: 0.1 10*3/uL (ref 0.0–0.5)
Eosinophils Relative: 2 %
HCT: 36 % (ref 36.0–46.0)
Hemoglobin: 11.9 g/dL — ABNORMAL LOW (ref 12.0–15.0)
Immature Granulocytes: 1 %
Lymphocytes Relative: 31 %
Lymphs Abs: 0.9 10*3/uL (ref 0.7–4.0)
MCH: 29.8 pg (ref 26.0–34.0)
MCHC: 33.1 g/dL (ref 30.0–36.0)
MCV: 90.2 fL (ref 80.0–100.0)
Monocytes Absolute: 0.3 10*3/uL (ref 0.1–1.0)
Monocytes Relative: 12 %
Neutro Abs: 1.5 10*3/uL — ABNORMAL LOW (ref 1.7–7.7)
Neutrophils Relative %: 52 %
Platelet Count: 178 10*3/uL (ref 150–400)
RBC: 3.99 MIL/uL (ref 3.87–5.11)
RDW: 15.9 % — ABNORMAL HIGH (ref 11.5–15.5)
WBC Count: 2.9 10*3/uL — ABNORMAL LOW (ref 4.0–10.5)
nRBC: 0 % (ref 0.0–0.2)

## 2022-07-29 LAB — CMP (CANCER CENTER ONLY)
ALT: 16 U/L (ref 0–44)
AST: 23 U/L (ref 15–41)
Albumin: 4.2 g/dL (ref 3.5–5.0)
Alkaline Phosphatase: 46 U/L (ref 38–126)
Anion gap: 9 (ref 5–15)
BUN: 15 mg/dL (ref 8–23)
CO2: 23 mmol/L (ref 22–32)
Calcium: 9.5 mg/dL (ref 8.9–10.3)
Chloride: 102 mmol/L (ref 98–111)
Creatinine: 0.89 mg/dL (ref 0.44–1.00)
GFR, Estimated: >60 mL/min (ref >60)
Glucose, Bld: 100 mg/dL — ABNORMAL HIGH (ref 70–99)
Potassium: 3.8 mmol/L (ref 3.5–5.1)
Sodium: 134 mmol/L — ABNORMAL LOW (ref 135–145)
Total Bilirubin: 0.4 mg/dL (ref 0.3–1.2)
Total Protein: 6.6 g/dL (ref 6.5–8.1)

## 2022-07-30 LAB — CANCER ANTIGEN 27.29: CA 27.29: 33 U/mL (ref 0.0–38.6)

## 2022-07-31 ENCOUNTER — Inpatient Hospital Stay: Payer: Medicare Other

## 2022-07-31 VITALS — BP 147/78 | HR 63 | Temp 97.7°F | Resp 18 | Ht 62.0 in | Wt 175.8 lb

## 2022-07-31 DIAGNOSIS — C50912 Malignant neoplasm of unspecified site of left female breast: Secondary | ICD-10-CM

## 2022-07-31 DIAGNOSIS — Z170 Estrogen receptor positive status [ER+]: Secondary | ICD-10-CM | POA: Diagnosis not present

## 2022-07-31 DIAGNOSIS — C50919 Malignant neoplasm of unspecified site of unspecified female breast: Secondary | ICD-10-CM

## 2022-07-31 DIAGNOSIS — C7951 Secondary malignant neoplasm of bone: Secondary | ICD-10-CM | POA: Diagnosis not present

## 2022-07-31 DIAGNOSIS — Z79899 Other long term (current) drug therapy: Secondary | ICD-10-CM | POA: Diagnosis not present

## 2022-07-31 DIAGNOSIS — C787 Secondary malignant neoplasm of liver and intrahepatic bile duct: Secondary | ICD-10-CM | POA: Diagnosis not present

## 2022-07-31 DIAGNOSIS — Z5111 Encounter for antineoplastic chemotherapy: Secondary | ICD-10-CM | POA: Diagnosis not present

## 2022-07-31 MED ORDER — DENOSUMAB 120 MG/1.7ML ~~LOC~~ SOLN
120.0000 mg | Freq: Once | SUBCUTANEOUS | Status: AC
  Administered 2022-07-31: 120 mg via SUBCUTANEOUS
  Filled 2022-07-31: qty 1.7

## 2022-07-31 MED ORDER — FULVESTRANT 250 MG/5ML IM SOSY
500.0000 mg | PREFILLED_SYRINGE | Freq: Once | INTRAMUSCULAR | Status: AC
  Administered 2022-07-31: 500 mg via INTRAMUSCULAR
  Filled 2022-07-31: qty 10

## 2022-07-31 NOTE — Patient Instructions (Signed)
Fulvestrant Injection What is this medication? FULVESTRANT (ful VES trant) treats breast cancer. It works by blocking the hormone estrogen in breast tissue, which prevents breast cancer cells from spreading or growing. This medicine may be used for other purposes; ask your health care provider or pharmacist if you have questions. COMMON BRAND NAME(S): FASLODEX What should I tell my care team before I take this medication? They need to know if you have any of these conditions: Bleeding disorder Liver disease Low blood cell levels, such as low white cells, red cells, and platelets An unusual or allergic reaction to fulvestrant, other medications, foods, dyes, or preservatives Pregnant or trying to get pregnant Breast-feeding How should I use this medication? This medication is injected into a muscle. It is given by your care team in a hospital or clinic setting. Talk to your care team about the use of this medication in children. Special care may be needed. Overdosage: If you think you have taken too much of this medicine contact a poison control center or emergency room at once. NOTE: This medicine is only for you. Do not share this medicine with others. What if I miss a dose? Keep appointments for follow-up doses. It is important not to miss your dose. Call your care team if you are unable to keep an appointment. What may interact with this medication? Certain medications that prevent or treat blood clots, such as warfarin, enoxaparin, dalteparin, apixaban, dabigatran, rivaroxaban This list may not describe all possible interactions. Give your health care provider a list of all the medicines, herbs, non-prescription drugs, or dietary supplements you use. Also tell them if you smoke, drink alcohol, or use illegal drugs. Some items may interact with your medicine. What should I watch for while using this medication? Your condition will be monitored carefully while you are receiving this  medication. You may need blood work while taking this medication. Talk to your care team if you may be pregnant. Serious birth defects can occur if you take this medication during pregnancy and for 1 year after the last dose. You will need a negative pregnancy test before starting this medication. Contraception is recommended while taking this medication and for 1 year after the last dose. Your care team can help you find the option that works for you. Do not breastfeed while taking this medication and for 1 year after the last dose. This medication may cause infertility. Talk to your care team if you are concerned about your fertility. What side effects may I notice from receiving this medication? Side effects that you should report to your care team as soon as possible: Allergic reactions or angioedema--skin rash, itching or hives, swelling of the face, eyes, lips, tongue, arms, or legs, trouble swallowing or breathing Pain, tingling, or numbness in the hands or feet Side effects that usually do not require medical attention (report to your care team if they continue or are bothersome): Bone, joint, or muscle pain Constipation Headache Hot flashes Nausea Pain, redness, or irritation at injection site Unusual weakness or fatigue This list may not describe all possible side effects. Call your doctor for medical advice about side effects. You may report side effects to FDA at 1-800-FDA-1088. Where should I keep my medication? This medication is given in a hospital or clinic. It will not be stored at home. NOTE: This sheet is a summary. It may not cover all possible information. If you have questions about this medicine, talk to your doctor, pharmacist, or health care provider.    2023 Elsevier/Gold Standard (2007-05-01 00:00:00) Denosumab Injection (Oncology) What is this medication? DENOSUMAB (den oh SUE mab) prevents weakened bones caused by cancer. It may also be used to treat noncancerous  bone tumors that cannot be removed by surgery. It can also be used to treat high calcium levels in the blood caused by cancer. It works by blocking a protein that causes bones to break down quickly. This slows down the release of calcium from bones, which lowers calcium levels in your blood. It also makes your bones stronger and less likely to break (fracture). This medicine may be used for other purposes; ask your health care provider or pharmacist if you have questions. COMMON BRAND NAME(S): XGEVA What should I tell my care team before I take this medication? They need to know if you have any of these conditions: Dental disease Having surgery or tooth extraction Infection Kidney disease Low levels of calcium or vitamin D in the blood Malnutrition On hemodialysis Skin conditions or sensitivity Thyroid or parathyroid disease An unusual reaction to denosumab, other medications, foods, dyes, or preservatives Pregnant or trying to get pregnant Breast-feeding How should I use this medication? This medication is for injection under the skin. It is given by your care team in a hospital or clinic setting. A special MedGuide will be given to you before each treatment. Be sure to read this information carefully each time. Talk to your care team about the use of this medication in children. While it may be prescribed for children as young as 13 years for selected conditions, precautions do apply. Overdosage: If you think you have taken too much of this medicine contact a poison control center or emergency room at once. NOTE: This medicine is only for you. Do not share this medicine with others. What if I miss a dose? Keep appointments for follow-up doses. It is important not to miss your dose. Call your care team if you are unable to keep an appointment. What may interact with this medication? Do not take this medication with any of the following: Other medications containing denosumab This  medication may also interact with the following: Medications that lower your chance of fighting infection Steroid medications, such as prednisone or cortisone This list may not describe all possible interactions. Give your health care provider a list of all the medicines, herbs, non-prescription drugs, or dietary supplements you use. Also tell them if you smoke, drink alcohol, or use illegal drugs. Some items may interact with your medicine. What should I watch for while using this medication? Your condition will be monitored carefully while you are receiving this medication. You may need blood work while taking this medication. This medication may increase your risk of getting an infection. Call your care team for advice if you get a fever, chills, sore throat, or other symptoms of a cold or flu. Do not treat yourself. Try to avoid being around people who are sick. You should make sure you get enough calcium and vitamin D while you are taking this medication, unless your care team tells you not to. Discuss the foods you eat and the vitamins you take with your care team. Some people who take this medication have severe bone, joint, or muscle pain. This medication may also increase your risk for jaw problems or a broken thigh bone. Tell your care team right away if you have severe pain in your jaw, bones, joints, or muscles. Tell your care team if you have any pain that does not go   away or that gets worse. Talk to your care team if you may be pregnant. Serious birth defects can occur if you take this medication during pregnancy and for 5 months after the last dose. You will need a negative pregnancy test before starting this medication. Contraception is recommended while taking this medication and for 5 months after the last dose. Your care team can help you find the option that works for you. What side effects may I notice from receiving this medication? Side effects that you should report to your care  team as soon as possible: Allergic reactions--skin rash, itching, hives, swelling of the face, lips, tongue, or throat Bone, joint, or muscle pain Low calcium level--muscle pain or cramps, confusion, tingling, or numbness in the hands or feet Osteonecrosis of the jaw--pain, swelling, or redness in the mouth, numbness of the jaw, poor healing after dental work, unusual discharge from the mouth, visible bones in the mouth Side effects that usually do not require medical attention (report to your care team if they continue or are bothersome): Cough Diarrhea Fatigue Headache Nausea This list may not describe all possible side effects. Call your doctor for medical advice about side effects. You may report side effects to FDA at 1-800-FDA-1088. Where should I keep my medication? This medication is given in a hospital or clinic. It will not be stored at home. NOTE: This sheet is a summary. It may not cover all possible information. If you have questions about this medicine, talk to your doctor, pharmacist, or health care provider.  2023 [employer]/Gold Standard (2021-07-29 00:00:00)  

## 2022-08-05 ENCOUNTER — Other Ambulatory Visit (HOSPITAL_COMMUNITY): Payer: Self-pay

## 2022-08-05 ENCOUNTER — Other Ambulatory Visit: Payer: Self-pay

## 2022-08-15 ENCOUNTER — Other Ambulatory Visit (HOSPITAL_COMMUNITY): Payer: Self-pay

## 2022-08-19 ENCOUNTER — Other Ambulatory Visit (HOSPITAL_COMMUNITY): Payer: Self-pay

## 2022-08-21 NOTE — Progress Notes (Addendum)
ADDENDUM: Her WBC's have decreased from 2.9 to 2.6 with ANC decreasing from 1500 to 1200. Hemoglobin is the same at 11.9.  Chemistries look good except hyponatremia of 132 and low calcium of 8.7. CA 27.29 remains normal at 31.5. We will proceed with treatment as usual.   [facility]  [address] [place],  [place]  [address] [phone]  Clinic Day: 08/26/22  Referring physician: Bobbye Morton, *  ASSESSMENT & PLAN:  Assessment:  Invasive ductal carcinoma of the breast, originally diagnosed in 1997 and treated with right mastectomy. She was treated with high dose chemotherapy followed by stem cell transplantation. She completed 5 years of tamoxifen in November 2002.  She later developed liver metastases, ER and HER2 positive, November 2007. She was treated with Herceptin.  Bone metastases and subcutaneous nodules found in November 2007. Treated with surgery, radiation, and hormonal therapy.   Scalp lesions which were confirmed to be metastatic breast cancer, but HER2 negative in 2015. She was placed on anastrozole in 2015 and changed to letrozole in 2018. At that time she had multiple lytic and sclerotic bone metastases.  She had a metastatic lesion of the right anterior chest in August 2017 confirmed to be adenocarcinoma and treated with excision and radiation.  Mass of the lateral C3 vertebral body with collapse treated with radiation in February 2019. At that time the letrozole was stopped and she was placed on fulvestrant injections.  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 Her doses had to be adjusted and she is now down to 75 mg every other day for 21 days on and 7 days off. She continues the fulvestrant every 28 days as well as denosumab every 28 days. She has been stable on this regimen for over 5 years now. She did experience neutropenia of 900 in early April, 2024 but we feel this is more  related to a prior COVID infection, since it has normalized since that time.   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  Hypothyroidism, which has been evaluated with an ultrasound of the thyroid and she is on supplement.  Hard mass of the posterior left knee with induration and swelling of the calf. She has had a negative Doppler for DVT and has no evidence of a Baker's cyst. This does not appear to be infectious as there is no heat or fluctuance, and did not respond to antibiotics.  I was concerned that this may represent metastatic breast cancer, but the MRI scan really does not support that, and the PET is negative.  This could be edema from her prior radiation but is quite indurated.  Dermatology has also seen and have performed a biopsy on November 28.  The pathology reveals dermal sclerosis and fat necrosis with no evidence of malignancy.  This is most consistent with radiation-induced fibrosis.  Plan:  She informed me that she would like a updated prescription for Lorazepam 1mg  Q8hr PRN for her anxiety. This was last filled a year ago with 30 tablets and I will refill that now. Patient had her last Faslodex and Xgeva injection on 07/31/2022 and she will continue that every 4 weeks. She informed me that she is due for a colonoscopy and asked my opinion on this. We discussed the fact that her prognosis is fairly good in terms of life span but she had no abnormalities on her last colonoscopy and she is 77 years old. Of note her CA 27.29 went up  in April to 41.5 but has been normal for 2 readings since then and we suspect it may be related to her COVID infection since that also dropped her ANC to 900 for the only time in years.  Her labs today are pending and I will see her back in 12 weeks with CBC, CMP, and CA 27.29.  She and her husband understand and agree with this plan of care. They know to call with any concerns regarding her breast cancer.  I provided 22 minutes of face-to-face time  during this this encounter and > 50% was spent counseling as documented under my assessment and plan.    Dellia Beckwith, MD [facility] AT [facility] [address] [place] [address] Dept: [phone] Dept Fax: [phone]   CHIEF COMPLAINT:  CC: Metastatic breast cancer  Current Treatment:  Fulvestrant, Rivka Barbara and palbociclib   HISTORY OF PRESENT ILLNESS:  Jade Mooney is a 77 y.o. female who presents as a transfer of care from Dr. Ruthann Cancer for the continued treatment and management of metastatic breast cancer. She was originally diagnosed in 1997, stage III, and underwent right mastectomy. She did receive adjuvant chemotherapy with Taxotere for 4 cycles, and also participated in a [facility] protocol with high-dose chemotherapy, cyclophosphamide/carboplatin/BCNU, followed by stem cell rescue in September 1997. She completed 5 years of tamoxifen in November 2002.  Unfortunately, she developed metastatic liver lesions which were biopsy proven metastatic breast cancer in November 2007. However, the tumor was estrogen receptor and HER2 positive, and so she was treated with Herceptin for a period of time.   In July 2015, she was found to have incidental findings of mixed lytic and sclerotic bony lesions on CT abdomen and pelvis. She underwent biopsy of the right iliac bone and surgical pathology confirmed invasive ductal carcinoma, grade 2, estrogen and progesterone receptor positive. Bone density from May 2015 was normal. She was started on zolendronic acid every 12 weeks in July 2015, but this was discontinued after January 2019. She was also found to have three scalp lesions, biopsied in July 2015 confirming metastatic breast cancer. HER2 negative. She was started on anastrozole in August 2015, and was switched to letrozole in April 2018. However, this was discontinued in February 2019 due to progression. She was started on  fulvestrant in February 2019.  She developed a skin lesion of the right anterior chest wall in July 2015, and biopsy revealed adenocarcinoma. This was resected in August 2017, and she received radiation, completed in December 2018. Palbociclib was added in January 2018 after PET imaging revealed 2 new foci at T8 and the manubrium. She received palliative radiation to the cervical spine in February 2019. Palbociclib was held during that time and resumed at 75 mg daily once completed. Rivka Barbara was started in March 2019.     I have reviewed her chart and materials related to her cancer extensively and collaborated history with the patient. Summary of oncologic history is as follows: Oncology History  Breast cancer metastasized to bone (HCC)  10/12/2013 Initial Diagnosis   Breast cancer metastasized to bone (HCC)   02/02/2020 - 11/14/2021 Chemotherapy   Patient is on Treatment Plan : BREAST FULVESTRANT & XGEVA Q28D     Primary malignant neoplasm of breast with metastasis (HCC)  07/12/2015 Initial Diagnosis   Metastatic breast cancer (HCC)   02/02/2020 - 11/14/2021 Chemotherapy   Patient is on Treatment Plan : BREAST FULVESTRANT & XGEVA Q28D  INTERVAL HISTORY:  Junell is here today for a routine follow up for metastatic breast cancer. Patient states that she is well and complains of a slight constant cough and chronic pain in her bones and knees but states that it is possibly a little worse from lack of exercising. She informed me that she would like a updated prescription for Lorazepam 1 mg Q8hr PRN for her anxiety. This was last filled a year ago with 30 tablets and I will refill that now. Patient had her last Faslodex and Xgeva injection on 07/31/2022 and she will continue that every 4 weeks. She informed me that she is due for a colonoscopy and asked my opinion on this. We discussed the fact that her prognosis is fairly good in terms of life span but she had no abnormalities on her last colonoscopy  and she is 77 years old. Of note her CA 27.29 went up in April to 41.5 but has been normal for 2 readings since then and we suspect it may be related to her COVID infection since that also dropped her ANC to 900 for the only time in years.  Her labs today are pending and I will see her back in 12 weeks with CBC, CMP, and CA 27.29.  She denies signs of infection such as sore throat, sinus drainage, cough, or urinary symptoms.  She denies fevers or recurrent chills. She denies pain. She denies nausea, vomiting, chest pain, dyspnea or cough. Her appetite is wonderful and her weight has been stable.Marland Kitchen  HISTORY:   Past Medical History:  Diagnosis Date   Anxiety    Cancer [facility])    Breast 1997 right tx with mastectomy and chemo, metastatic now   GERD (gastroesophageal reflux disease)    Hypercholesterolemia    Hypertension    Hypothyroidism    Osteoarthritis    oa   Personal history of chemotherapy    Personal history of radiation therapy    Secondary malignant neoplasm of bone and bone marrow (HCC)    TIA (transient ischemic attack) last 09/10/20   x 3 total, they seem to occur every 5 years    Past Surgical History:  Procedure Laterality Date   CATARACT EXTRACTION, BILATERAL     CERVICAL FUSION  2020   MASTECTOMY MODIFIED RADICAL Right 1997   porta cath insertion  1997   later removed   radiation tx  finished 02-25-16   x 20 tx   TONSILLECTOMY     TOTAL HIP ARTHROPLASTY Left 03/12/2016   Procedure: LEFT TOTAL HIP ARTHROPLASTY ANTERIOR APPROACH;  Surgeon: Ollen Gross, MD;  Location: [facility];  Service: Orthopedics;  Laterality: Left;   TOTAL KNEE ARTHROPLASTY Left    TOTAL KNEE ARTHROPLASTY Right 10/30/2021   Procedure: TOTAL KNEE ARTHROPLASTY;  Surgeon: Eugenia Mcalpine, MD;  Location: [facility];  Service: Orthopedics;  Laterality: Right;  adductor canal 120   TUBAL LIGATION      Family History  Problem Relation Age of Onset   Breast cancer Maternal Aunt     Social History:  reports  that she has never smoked. She has never used smokeless tobacco. She reports current alcohol use. She reports that she does not use drugs.The patient is accompanied by her husband Maisie Fus today. She is married and lives at home with her spouse. They are planning a cruise. She is retired from work, and has never been exposed to chemicals or other toxic agents.  Allergies:  Allergies  Allergen Reactions   Other Nausea And  Vomiting and Other (See Comments)   Oxycodone-Acetaminophen Other (See Comments)   Oxycodone-Aspirin Other (See Comments)   Erythromycin Other (See Comments)   Oxycodone Other (See Comments)   Oxycodone Hcl Nausea And Vomiting   Percocet [Oxycodone-Acetaminophen] Nausea And Vomiting   Percodan [Oxycodone-Aspirin] Nausea And Vomiting   Tramadol Itching   Amoxicillin Hives    Has patient had a PCN reaction causing immediate rash, facial/tongue/throat swelling, SOB or lightheadedness with hypotension:unsure  Has patient had a PCN reaction causing severe rash involving mucus membranes or skin necrosis:No  Has patient had a PCN reaction that required hospitalization:No  Has patient had a PCN reaction occurring within the last 10 years: Yes  If all of the above answers are "NO", then may proceed with Cephalosporin use.  Has patient had a PCN reaction causing immediate rash, facial/tongue/throat swelling, SOB or lightheadedness with hypotension:unsure, Has patient had a PCN reaction causing severe rash involving mucus membranes or skin necrosis:No, Has patient had a PCN reaction that required hospitalization:No, Has patient had a PCN reaction occurring within the last 10 years: Yes, If all of the above answers are "NO", then may proceed with Cephalosporin use.    Current Medications: Current Outpatient Medications  Medication Sig Dispense Refill   acetaminophen (TYLENOL) 500 MG tablet Take 1,000 mg by mouth every 6 (six) hours as needed for mild pain.     acidophilus  (RISAQUAD) CAPS capsule Take 1 capsule by mouth daily.     ascorbic acid (VITAMIN C) 1000 MG tablet Take 1,000 mg by mouth daily.     aspirin 325 MG tablet Take 325 mg by mouth daily.     beta carotene 15 MG capsule Take 15 mg by mouth daily.     Biotin 5000 MCG TABS Take 5,000 mcg by mouth daily.     CALCIUM CITRATE-VITAMIN D PO Take 1 tablet by mouth 3 (three) times daily. Calcium 400mg  + D 500IU three times day     clopidogrel (PLAVIX) 75 MG tablet Take 1 tablet (75 mg total) by mouth daily.     cyanocobalamin (,VITAMIN B-12,) 1000 MCG/ML injection Inject 1,000 mcg into the muscle every 30 (thirty) days.     Denosumab (XGEVA [place]) Inject 1 Dose into the skin every 30 (thirty) days.     famotidine (PEPCID) 40 MG tablet Take 40 mg by mouth daily.     glucosamine-chondroitin 500-400 MG tablet Take 1 tablet by mouth daily.     ketotifen (ZADITOR) 0.025 % ophthalmic solution Place 1 drop into both eyes daily as needed (allergies).     levothyroxine (SYNTHROID, LEVOTHROID) 88 MCG tablet Take 88 mcg by mouth daily before breakfast.     LORazepam (ATIVAN) 1 MG tablet Take 1 tablet (1 mg total) by mouth every 8 (eight) hours as needed for anxiety. 30 tablet 0   Lutein 20 MG CAPS Take 20 mg by mouth daily.     meclizine (ANTIVERT) 25 MG tablet Take 25 mg by mouth 3 (three) times daily as needed for dizziness.     melatonin 5 MG TABS Take 5 mg by mouth at bedtime as needed.     metroNIDAZOLE (METROCREAM) 0.75 % cream Apply 1 Application topically daily.     Multiple Vitamins-Minerals (MULTIVITAMIN WITH MINERALS) tablet Take 1 tablet by mouth daily.     Omega-3 1000 MG CAPS Take 1,000 mg by mouth in the morning and at bedtime.     palbociclib (IBRANCE) 75 MG tablet Take 1 tablet (75 mg total) by  mouth daily. Take as directed by MD for 21 days on, 7 days off, repeat every 28 days. 21 tablet 5   pantoprazole (PROTONIX) 40 MG tablet Take 40 mg by mouth daily. Before breakfast and before supper     Polyethyl  Glycol-Propyl Glycol 0.4-0.3 % SOLN Place 1-2 drops into both eyes 2 (two) times daily.     rOPINIRole (REQUIP) 2 MG tablet Take 4 mg by mouth at bedtime.     rosuvastatin (CRESTOR) 20 MG tablet Take 1 tablet (20 mg total) by mouth daily.     vitamin E 180 MG (400 UNITS) capsule Take 400 Units by mouth daily.     zinc gluconate 50 MG tablet Take 50 mg by mouth daily.     No current facility-administered medications for this visit.    REVIEW OF SYSTEMS:  Review of Systems  Constitutional: Negative.  Negative for appetite change, chills, diaphoresis, fatigue, fever and unexpected weight change.  HENT:  Negative.  Negative for hearing loss, lump/mass, mouth sores, nosebleeds, sore throat, tinnitus, trouble swallowing and voice change.   Eyes: Negative.  Negative for eye problems and icterus.  Respiratory:  Positive for cough (no phlegm). Negative for chest tightness, hemoptysis, shortness of breath and wheezing.   Cardiovascular: Negative.  Negative for chest pain, leg swelling and palpitations.  Gastrointestinal: Negative.  Negative for abdominal distention, abdominal pain, blood in stool, constipation, diarrhea, nausea, rectal pain and vomiting.  Endocrine: Negative.   Genitourinary: Negative.  Negative for bladder incontinence, difficulty urinating, dyspareunia, dysuria, frequency, hematuria, menstrual problem, nocturia, pelvic pain, vaginal bleeding and vaginal discharge.   Musculoskeletal:  Positive for arthralgias (chronic. slightly worse). Negative for back pain, flank pain, gait problem, myalgias, neck pain and neck stiffness.       Pain in her bones, left hip, and  knees (chronic)   Skin: Negative.  Negative for itching, rash and wound.  Neurological: Negative.  Negative for dizziness, extremity weakness, gait problem, headaches, light-headedness, numbness, seizures and speech difficulty.  Hematological: Negative.  Negative for adenopathy. Does not bruise/bleed easily.   Psychiatric/Behavioral: Negative.  Negative for confusion, decreased concentration, depression, sleep disturbance and suicidal ideas. The patient is not nervous/anxious.     VITALS:  Blood pressure (!) 144/70, pulse 64, temperature 97.6 F (36.4 C), temperature source Oral, resp. rate 16, height 5\' 2"  (1.575 m), weight 175 lb (79.4 kg), SpO2 100 %.  Wt Readings from Last 3 Encounters:  08/28/22 176 lb 12 oz (80.2 kg)  08/26/22 175 lb (79.4 kg)  07/31/22 175 lb 12 oz (79.7 kg)    Body mass index is 32.01 kg/m.  Performance status (ECOG): 1 - Symptomatic but completely ambulatory  PHYSICAL EXAM:  Physical Exam Vitals and nursing note reviewed.  Constitutional:      General: She is not in acute distress.    Appearance: Normal appearance. She is normal weight. She is not ill-appearing, toxic-appearing or diaphoretic.  HENT:     Head: Normocephalic and atraumatic.     Right Ear: Tympanic membrane, ear canal and external ear normal. There is no impacted cerumen.     Left Ear: Tympanic membrane, ear canal and external ear normal. There is no impacted cerumen.     Nose: Nose normal. No congestion or rhinorrhea.     Mouth/Throat:     Mouth: Mucous membranes are moist.     Pharynx: Oropharynx is clear. No oropharyngeal exudate or posterior oropharyngeal erythema.  Eyes:     General: No scleral icterus.  Right eye: No discharge.        Left eye: No discharge.     Extraocular Movements: Extraocular movements intact.     Conjunctiva/sclera: Conjunctivae normal.     Pupils: Pupils are equal, round, and reactive to light.  Neck:     Vascular: No carotid bruit.  Cardiovascular:     Rate and Rhythm: Normal rate and regular rhythm.     Pulses: Normal pulses.     Heart sounds: Normal heart sounds. No murmur heard.    No friction rub. No gallop.  Pulmonary:     Effort: Pulmonary effort is normal. No respiratory distress.     Breath sounds: Normal breath sounds. No stridor. No  wheezing, rhonchi or rales.  Chest:     Chest wall: No tenderness.     Comments: Right mastectomy has a firmness but is negative  Left breast is without masses. Abdominal:     General: Bowel sounds are normal. There is no distension.     Palpations: Abdomen is soft. There is no hepatomegaly, splenomegaly or mass.     Tenderness: There is no abdominal tenderness. There is no right CVA tenderness, left CVA tenderness, guarding or rebound.     Hernia: No hernia is present.  Musculoskeletal:        General: No swelling, tenderness, deformity or signs of injury. Normal range of motion.     Cervical back: Normal range of motion and neck supple. No rigidity or tenderness.     Right lower leg: No edema.     Left lower leg: No edema.     Comments: Severe induration of posterior knee and around to the lateral aspect.  Lymphadenopathy:     Cervical: No cervical adenopathy.     Right cervical: No superficial, deep or posterior cervical adenopathy.    Left cervical: No superficial, deep or posterior cervical adenopathy.     Upper Body:     Right upper body: No supraclavicular, axillary or pectoral adenopathy.     Left upper body: No supraclavicular, axillary or pectoral adenopathy.  Skin:    General: Skin is warm and dry.     Coloration: Skin is not jaundiced or pale.     Findings: No bruising, erythema, lesion or rash.  Neurological:     General: No focal deficit present.     Mental Status: She is alert and oriented to person, place, and time. Mental status is at baseline.     Cranial Nerves: No cranial nerve deficit.     Sensory: No sensory deficit.     Motor: No weakness.     Coordination: Coordination normal.     Gait: Gait normal.     Deep Tendon Reflexes: Reflexes normal.  Psychiatric:        Mood and Affect: Mood normal.        Behavior: Behavior normal.        Thought Content: Thought content normal.        Judgment: Judgment normal.    LABS:      Latest Ref Rng & Units  08/26/2022    9:58 AM 07/29/2022   10:10 AM 07/10/2022    9:21 AM  CBC  WBC 4.0 - 10.5 K/uL 2.6  2.9  3.0   Hemoglobin 12.0 - 15.0 g/dL 16.1  09.6  04.5   Hematocrit 36.0 - 46.0 % 35.7  36.0  33.7   Platelets 150 - 400 K/uL 154  178  180  Latest Ref Rng & Units 08/26/2022    9:58 AM 07/29/2022   10:10 AM 07/10/2022    9:21 AM  CMP  Glucose 70 - 99 mg/dL 811  914  782   BUN 8 - 23 mg/dL 17  15  11    Creatinine 0.44 - 1.00 mg/dL 9.56  2.13  0.86   Sodium 135 - 145 mmol/L 132  134  135   Potassium 3.5 - 5.1 mmol/L 4.0  3.8  3.8   Chloride 98 - 111 mmol/L 101  102  103   CO2 22 - 32 mmol/L 22  23  22    Calcium 8.9 - 10.3 mg/dL 8.7  9.5  8.3   Total Protein 6.5 - 8.1 g/dL 6.5  6.6  6.4   Total Bilirubin 0.3 - 1.2 mg/dL 0.4  0.4  0.6   Alkaline Phos 38 - 126 U/L 48  46  45   AST 15 - 41 U/L 24  23  20    ALT 0 - 44 U/L 17  16  12     Component Ref Range & Units 3 wk ago (07/29/22) 1 mo ago (07/10/22) 1 mo ago (07/01/22) 2 mo ago (06/03/22) 3 mo ago (05/06/22) 4 mo ago (04/08/22) 5 mo ago (03/03/22)  CA 27.29 0.0 - 38.6 U/mL 33.0 24.4 CM 41.1 High  CM 24.5 CM 15.1 CM 29.9 CM 26.0 CM   Lab Results  Component Value Date   LDH 138 01/17/2011   LDH 155 12/28/2009   LDH 155 12/29/2008    STUDIES:  EXAM:05/20/2022 DIGITAL SCREENING UNILATERAL LEFT MAMMOGRAM WITH CAD AND TOMOSYNTHESIS IMPRESSION: No mammographic evidence of malignancy. A result letter of this screening mammogram will be mailed directly to the patient.    I,Jasmine M Lassiter,acting as a scribe for Dellia Beckwith, MD.,have documented all relevant documentation on the behalf of Dellia Beckwith, MD,as directed by  Dellia Beckwith, MD while in the presence of Dellia Beckwith, MD.

## 2022-08-26 ENCOUNTER — Inpatient Hospital Stay: Payer: Medicare Other | Attending: Oncology

## 2022-08-26 ENCOUNTER — Other Ambulatory Visit: Payer: Self-pay | Admitting: Oncology

## 2022-08-26 ENCOUNTER — Inpatient Hospital Stay (INDEPENDENT_AMBULATORY_CARE_PROVIDER_SITE_OTHER): Payer: Medicare Other | Admitting: Oncology

## 2022-08-26 ENCOUNTER — Encounter: Payer: Self-pay | Admitting: Oncology

## 2022-08-26 VITALS — BP 144/70 | HR 64 | Temp 97.6°F | Resp 16 | Ht 62.0 in | Wt 175.0 lb

## 2022-08-26 DIAGNOSIS — F411 Generalized anxiety disorder: Secondary | ICD-10-CM

## 2022-08-26 DIAGNOSIS — C7951 Secondary malignant neoplasm of bone: Secondary | ICD-10-CM | POA: Diagnosis not present

## 2022-08-26 DIAGNOSIS — Z5111 Encounter for antineoplastic chemotherapy: Secondary | ICD-10-CM | POA: Insufficient documentation

## 2022-08-26 DIAGNOSIS — C50919 Malignant neoplasm of unspecified site of unspecified female breast: Secondary | ICD-10-CM | POA: Diagnosis not present

## 2022-08-26 DIAGNOSIS — Z170 Estrogen receptor positive status [ER+]: Secondary | ICD-10-CM | POA: Insufficient documentation

## 2022-08-26 DIAGNOSIS — C50811 Malignant neoplasm of overlapping sites of right female breast: Secondary | ICD-10-CM

## 2022-08-26 LAB — CBC WITH DIFFERENTIAL (CANCER CENTER ONLY)
Abs Immature Granulocytes: 0.01 10*3/uL (ref 0.00–0.07)
Basophils Absolute: 0.1 10*3/uL (ref 0.0–0.1)
Basophils Relative: 3 %
Eosinophils Absolute: 0.1 10*3/uL (ref 0.0–0.5)
Eosinophils Relative: 2 %
HCT: 35.7 % — ABNORMAL LOW (ref 36.0–46.0)
Hemoglobin: 11.9 g/dL — ABNORMAL LOW (ref 12.0–15.0)
Immature Granulocytes: 0 %
Lymphocytes Relative: 36 %
Lymphs Abs: 0.9 10*3/uL (ref 0.7–4.0)
MCH: 29.9 pg (ref 26.0–34.0)
MCHC: 33.3 g/dL (ref 30.0–36.0)
MCV: 89.7 fL (ref 80.0–100.0)
Monocytes Absolute: 0.3 10*3/uL (ref 0.1–1.0)
Monocytes Relative: 13 %
Neutro Abs: 1.2 10*3/uL — ABNORMAL LOW (ref 1.7–7.7)
Neutrophils Relative %: 46 %
Platelet Count: 154 10*3/uL (ref 150–400)
RBC: 3.98 MIL/uL (ref 3.87–5.11)
RDW: 15.4 % (ref 11.5–15.5)
WBC Count: 2.6 10*3/uL — ABNORMAL LOW (ref 4.0–10.5)
nRBC: 0 % (ref 0.0–0.2)

## 2022-08-26 LAB — CMP (CANCER CENTER ONLY)
ALT: 17 U/L (ref 0–44)
AST: 24 U/L (ref 15–41)
Albumin: 4.2 g/dL (ref 3.5–5.0)
Alkaline Phosphatase: 48 U/L (ref 38–126)
Anion gap: 9 (ref 5–15)
BUN: 17 mg/dL (ref 8–23)
CO2: 22 mmol/L (ref 22–32)
Calcium: 8.7 mg/dL — ABNORMAL LOW (ref 8.9–10.3)
Chloride: 101 mmol/L (ref 98–111)
Creatinine: 0.78 mg/dL (ref 0.44–1.00)
GFR, Estimated: >60 mL/min (ref >60)
Glucose, Bld: 109 mg/dL — ABNORMAL HIGH (ref 70–99)
Potassium: 4 mmol/L (ref 3.5–5.1)
Sodium: 132 mmol/L — ABNORMAL LOW (ref 135–145)
Total Bilirubin: 0.4 mg/dL (ref 0.3–1.2)
Total Protein: 6.5 g/dL (ref 6.5–8.1)

## 2022-08-26 MED ORDER — LORAZEPAM 1 MG PO TABS
1.0000 mg | ORAL_TABLET | Freq: Three times a day (TID) | ORAL | 0 refills | Status: DC | PRN
Start: 2022-08-26 — End: 2023-08-11

## 2022-08-27 ENCOUNTER — Telehealth: Payer: Self-pay

## 2022-08-27 DIAGNOSIS — D519 Vitamin B12 deficiency anemia, unspecified: Secondary | ICD-10-CM | POA: Diagnosis not present

## 2022-08-27 LAB — CANCER ANTIGEN 27.29: CA 27.29: 31.5 U/mL (ref 0.0–38.6)

## 2022-08-27 NOTE — Telephone Encounter (Signed)
-----   Message from Dellia Beckwith, MD sent at 08/27/2022 10:15 AM EDT ----- Regarding: lab CA 27.29 still normal at 31.5

## 2022-08-27 NOTE — Telephone Encounter (Signed)
Called patient and notified her of the lab results. 

## 2022-08-28 ENCOUNTER — Inpatient Hospital Stay: Payer: Medicare Other

## 2022-08-28 VITALS — BP 152/94 | HR 69 | Temp 97.4°F | Resp 18 | Ht 62.0 in | Wt 176.8 lb

## 2022-08-28 DIAGNOSIS — Z170 Estrogen receptor positive status [ER+]: Secondary | ICD-10-CM | POA: Diagnosis not present

## 2022-08-28 DIAGNOSIS — C50919 Malignant neoplasm of unspecified site of unspecified female breast: Secondary | ICD-10-CM

## 2022-08-28 DIAGNOSIS — C7951 Secondary malignant neoplasm of bone: Secondary | ICD-10-CM | POA: Diagnosis not present

## 2022-08-28 DIAGNOSIS — Z5111 Encounter for antineoplastic chemotherapy: Secondary | ICD-10-CM | POA: Diagnosis not present

## 2022-08-28 DIAGNOSIS — C50912 Malignant neoplasm of unspecified site of left female breast: Secondary | ICD-10-CM

## 2022-08-28 MED ORDER — FULVESTRANT 250 MG/5ML IM SOSY
500.0000 mg | PREFILLED_SYRINGE | Freq: Once | INTRAMUSCULAR | Status: AC
  Administered 2022-08-28: 500 mg via INTRAMUSCULAR
  Filled 2022-08-28: qty 10

## 2022-08-28 MED ORDER — DENOSUMAB 120 MG/1.7ML ~~LOC~~ SOLN
120.0000 mg | Freq: Once | SUBCUTANEOUS | Status: AC
  Administered 2022-08-28: 120 mg via SUBCUTANEOUS
  Filled 2022-08-28: qty 1.7

## 2022-08-28 NOTE — Patient Instructions (Signed)

## 2022-09-05 ENCOUNTER — Encounter: Payer: Self-pay | Admitting: Oncology

## 2022-09-16 ENCOUNTER — Other Ambulatory Visit (HOSPITAL_COMMUNITY): Payer: Self-pay

## 2022-09-17 ENCOUNTER — Other Ambulatory Visit (HOSPITAL_COMMUNITY): Payer: Self-pay

## 2022-09-22 ENCOUNTER — Inpatient Hospital Stay: Payer: Medicare Other | Attending: Oncology

## 2022-09-22 DIAGNOSIS — C7951 Secondary malignant neoplasm of bone: Secondary | ICD-10-CM | POA: Diagnosis not present

## 2022-09-22 DIAGNOSIS — C50919 Malignant neoplasm of unspecified site of unspecified female breast: Secondary | ICD-10-CM | POA: Insufficient documentation

## 2022-09-22 DIAGNOSIS — Z5111 Encounter for antineoplastic chemotherapy: Secondary | ICD-10-CM | POA: Insufficient documentation

## 2022-09-22 DIAGNOSIS — Z170 Estrogen receptor positive status [ER+]: Secondary | ICD-10-CM

## 2022-09-22 LAB — CBC WITH DIFFERENTIAL (CANCER CENTER ONLY)
Abs Immature Granulocytes: 0.01 10*3/uL (ref 0.00–0.07)
Basophils Absolute: 0.1 10*3/uL (ref 0.0–0.1)
Basophils Relative: 2 %
Eosinophils Absolute: 0.1 10*3/uL (ref 0.0–0.5)
Eosinophils Relative: 3 %
HCT: 37 % (ref 36.0–46.0)
Hemoglobin: 12.1 g/dL (ref 12.0–15.0)
Immature Granulocytes: 0 %
Lymphocytes Relative: 34 %
Lymphs Abs: 0.8 10*3/uL (ref 0.7–4.0)
MCH: 30 pg (ref 26.0–34.0)
MCHC: 32.7 g/dL (ref 30.0–36.0)
MCV: 91.8 fL (ref 80.0–100.0)
Monocytes Absolute: 0.3 10*3/uL (ref 0.1–1.0)
Monocytes Relative: 12 %
Neutro Abs: 1.2 10*3/uL — ABNORMAL LOW (ref 1.7–7.7)
Neutrophils Relative %: 49 %
Platelet Count: 191 10*3/uL (ref 150–400)
RBC: 4.03 MIL/uL (ref 3.87–5.11)
RDW: 15.4 % (ref 11.5–15.5)
WBC Count: 2.5 10*3/uL — ABNORMAL LOW (ref 4.0–10.5)
nRBC: 0 % (ref 0.0–0.2)

## 2022-09-22 LAB — CMP (CANCER CENTER ONLY)
ALT: 15 U/L (ref 0–44)
AST: 22 U/L (ref 15–41)
Albumin: 4.1 g/dL (ref 3.5–5.0)
Alkaline Phosphatase: 47 U/L (ref 38–126)
Anion gap: 10 (ref 5–15)
BUN: 16 mg/dL (ref 8–23)
CO2: 22 mmol/L (ref 22–32)
Calcium: 8.6 mg/dL — ABNORMAL LOW (ref 8.9–10.3)
Chloride: 101 mmol/L (ref 98–111)
Creatinine: 0.87 mg/dL (ref 0.44–1.00)
GFR, Estimated: >60 mL/min (ref >60)
Glucose, Bld: 119 mg/dL — ABNORMAL HIGH (ref 70–99)
Potassium: 4.1 mmol/L (ref 3.5–5.1)
Sodium: 133 mmol/L — ABNORMAL LOW (ref 135–145)
Total Bilirubin: 0.4 mg/dL (ref 0.3–1.2)
Total Protein: 6.6 g/dL (ref 6.5–8.1)

## 2022-09-23 LAB — CANCER ANTIGEN 27.29: CA 27.29: 23.4 U/mL (ref 0.0–38.6)

## 2022-09-24 ENCOUNTER — Inpatient Hospital Stay: Payer: Medicare Other

## 2022-09-24 VITALS — BP 146/74 | HR 67 | Temp 97.6°F | Resp 18 | Ht 62.0 in | Wt 179.0 lb

## 2022-09-24 DIAGNOSIS — C50912 Malignant neoplasm of unspecified site of left female breast: Secondary | ICD-10-CM

## 2022-09-24 DIAGNOSIS — C7951 Secondary malignant neoplasm of bone: Secondary | ICD-10-CM | POA: Diagnosis not present

## 2022-09-24 DIAGNOSIS — C50919 Malignant neoplasm of unspecified site of unspecified female breast: Secondary | ICD-10-CM

## 2022-09-24 DIAGNOSIS — Z5111 Encounter for antineoplastic chemotherapy: Secondary | ICD-10-CM | POA: Diagnosis not present

## 2022-09-24 MED ORDER — DENOSUMAB 120 MG/1.7ML ~~LOC~~ SOLN
120.0000 mg | Freq: Once | SUBCUTANEOUS | Status: AC
  Administered 2022-09-24: 120 mg via SUBCUTANEOUS
  Filled 2022-09-24: qty 1.7

## 2022-09-24 MED ORDER — FULVESTRANT 250 MG/5ML IM SOSY
500.0000 mg | PREFILLED_SYRINGE | Freq: Once | INTRAMUSCULAR | Status: AC
  Administered 2022-09-24: 500 mg via INTRAMUSCULAR
  Filled 2022-09-24: qty 10

## 2022-09-24 NOTE — Progress Notes (Signed)
THE PATIENT HAS INCREASED HER CALCIUM PILLS TO 3 A DAY.

## 2022-09-24 NOTE — Patient Instructions (Signed)
Fulvestrant Injection What is this medication? FULVESTRANT (ful VES trant) treats breast cancer. It works by blocking the hormone estrogen in breast tissue, which prevents breast cancer cells from spreading or growing. This medicine may be used for other purposes; ask your health care provider or pharmacist if you have questions. COMMON BRAND NAME(S): FASLODEX What should I tell my care team before I take this medication? They need to know if you have any of these conditions: Bleeding disorder Liver disease Low blood cell levels, such as low white cells, red cells, and platelets An unusual or allergic reaction to fulvestrant, other medications, foods, dyes, or preservatives Pregnant or trying to get pregnant Breast-feeding How should I use this medication? This medication is injected into a muscle. It is given by your care team in a hospital or clinic setting. Talk to your care team about the use of this medication in children. Special care may be needed. Overdosage: If you think you have taken too much of this medicine contact a poison control center or emergency room at once. NOTE: This medicine is only for you. Do not share this medicine with others. What if I miss a dose? Keep appointments for follow-up doses. It is important not to miss your dose. Call your care team if you are unable to keep an appointment. What may interact with this medication? Certain medications that prevent or treat blood clots, such as warfarin, enoxaparin, dalteparin, apixaban, dabigatran, rivaroxaban This list may not describe all possible interactions. Give your health care provider a list of all the medicines, herbs, non-prescription drugs, or dietary supplements you use. Also tell them if you smoke, drink alcohol, or use illegal drugs. Some items may interact with your medicine. What should I watch for while using this medication? Your condition will be monitored carefully while you are receiving this  medication. You may need blood work while taking this medication. Talk to your care team if you may be pregnant. Serious birth defects can occur if you take this medication during pregnancy and for 1 year after the last dose. You will need a negative pregnancy test before starting this medication. Contraception is recommended while taking this medication and for 1 year after the last dose. Your care team can help you find the option that works for you. Do not breastfeed while taking this medication and for 1 year after the last dose. This medication may cause infertility. Talk to your care team if you are concerned about your fertility. What side effects may I notice from receiving this medication? Side effects that you should report to your care team as soon as possible: Allergic reactions or angioedema--skin rash, itching or hives, swelling of the face, eyes, lips, tongue, arms, or legs, trouble swallowing or breathing Pain, tingling, or numbness in the hands or feet Side effects that usually do not require medical attention (report to your care team if they continue or are bothersome): Bone, joint, or muscle pain Constipation Headache Hot flashes Nausea Pain, redness, or irritation at injection site Unusual weakness or fatigue This list may not describe all possible side effects. Call your doctor for medical advice about side effects. You may report side effects to FDA at 1-800-FDA-1088. Where should I keep my medication? This medication is given in a hospital or clinic. It will not be stored at home. NOTE: This sheet is a summary. It may not cover all possible information. If you have questions about this medicine, talk to your doctor, pharmacist, or health care provider.    2024 Elsevier/Gold Standard (2021-07-23 00:00:00) Denosumab Injection (Oncology) What is this medication? DENOSUMAB (den oh SUE mab) prevents weakened bones caused by cancer. It may also be used to treat noncancerous  bone tumors that cannot be removed by surgery. It can also be used to treat high calcium levels in the blood caused by cancer. It works by blocking a protein that causes bones to break down quickly. This slows down the release of calcium from bones, which lowers calcium levels in your blood. It also makes your bones stronger and less likely to break (fracture). This medicine may be used for other purposes; ask your health care provider or pharmacist if you have questions. COMMON BRAND NAME(S): XGEVA What should I tell my care team before I take this medication? They need to know if you have any of these conditions: Dental disease Having surgery or tooth extraction Infection Kidney disease Low levels of calcium or vitamin D in the blood Malnutrition On hemodialysis Skin conditions or sensitivity Thyroid or parathyroid disease An unusual reaction to denosumab, other medications, foods, dyes, or preservatives Pregnant or trying to get pregnant Breast-feeding How should I use this medication? This medication is for injection under the skin. It is given by your care team in a hospital or clinic setting. A special MedGuide will be given to you before each treatment. Be sure to read this information carefully each time. Talk to your care team about the use of this medication in children. While it may be prescribed for children as young as 13 years for selected conditions, precautions do apply. Overdosage: If you think you have taken too much of this medicine contact a poison control center or emergency room at once. NOTE: This medicine is only for you. Do not share this medicine with others. What if I miss a dose? Keep appointments for follow-up doses. It is important not to miss your dose. Call your care team if you are unable to keep an appointment. What may interact with this medication? Do not take this medication with any of the following: Other medications containing denosumab This  medication may also interact with the following: Medications that lower your chance of fighting infection Steroid medications, such as prednisone or cortisone This list may not describe all possible interactions. Give your health care provider a list of all the medicines, herbs, non-prescription drugs, or dietary supplements you use. Also tell them if you smoke, drink alcohol, or use illegal drugs. Some items may interact with your medicine. What should I watch for while using this medication? Your condition will be monitored carefully while you are receiving this medication. You may need blood work while taking this medication. This medication may increase your risk of getting an infection. Call your care team for advice if you get a fever, chills, sore throat, or other symptoms of a cold or flu. Do not treat yourself. Try to avoid being around people who are sick. You should make sure you get enough calcium and vitamin D while you are taking this medication, unless your care team tells you not to. Discuss the foods you eat and the vitamins you take with your care team. Some people who take this medication have severe bone, joint, or muscle pain. This medication may also increase your risk for jaw problems or a broken thigh bone. Tell your care team right away if you have severe pain in your jaw, bones, joints, or muscles. Tell your care team if you have any pain that does not go   away or that gets worse. Talk to your care team if you may be pregnant. Serious birth defects can occur if you take this medication during pregnancy and for 5 months after the last dose. You will need a negative pregnancy test before starting this medication. Contraception is recommended while taking this medication and for 5 months after the last dose. Your care team can help you find the option that works for you. What side effects may I notice from receiving this medication? Side effects that you should report to your care  team as soon as possible: Allergic reactions--skin rash, itching, hives, swelling of the face, lips, tongue, or throat Bone, joint, or muscle pain Low calcium level--muscle pain or cramps, confusion, tingling, or numbness in the hands or feet Osteonecrosis of the jaw--pain, swelling, or redness in the mouth, numbness of the jaw, poor healing after dental work, unusual discharge from the mouth, visible bones in the mouth Side effects that usually do not require medical attention (report to your care team if they continue or are bothersome): Cough Diarrhea Fatigue Headache Nausea This list may not describe all possible side effects. Call your doctor for medical advice about side effects. You may report side effects to FDA at 1-800-FDA-1088. Where should I keep my medication? This medication is given in a hospital or clinic. It will not be stored at home. NOTE: This sheet is a summary. It may not cover all possible information. If you have questions about this medicine, talk to your doctor, pharmacist, or health care provider.  2024 Elsevier/Gold Standard (2021-07-31 00:00:00)  

## 2022-09-29 DIAGNOSIS — D519 Vitamin B12 deficiency anemia, unspecified: Secondary | ICD-10-CM | POA: Diagnosis not present

## 2022-10-02 ENCOUNTER — Other Ambulatory Visit (HOSPITAL_COMMUNITY): Payer: Self-pay

## 2022-10-07 ENCOUNTER — Other Ambulatory Visit: Payer: Self-pay

## 2022-10-08 ENCOUNTER — Other Ambulatory Visit (HOSPITAL_COMMUNITY): Payer: Self-pay

## 2022-10-08 ENCOUNTER — Other Ambulatory Visit: Payer: Self-pay

## 2022-10-08 DIAGNOSIS — E538 Deficiency of other specified B group vitamins: Secondary | ICD-10-CM | POA: Diagnosis not present

## 2022-10-08 DIAGNOSIS — E559 Vitamin D deficiency, unspecified: Secondary | ICD-10-CM | POA: Diagnosis not present

## 2022-10-08 DIAGNOSIS — Z131 Encounter for screening for diabetes mellitus: Secondary | ICD-10-CM | POA: Diagnosis not present

## 2022-10-08 DIAGNOSIS — E039 Hypothyroidism, unspecified: Secondary | ICD-10-CM | POA: Diagnosis not present

## 2022-10-08 DIAGNOSIS — E782 Mixed hyperlipidemia: Secondary | ICD-10-CM | POA: Diagnosis not present

## 2022-10-15 DIAGNOSIS — Z0000 Encounter for general adult medical examination without abnormal findings: Secondary | ICD-10-CM | POA: Diagnosis not present

## 2022-10-15 DIAGNOSIS — E782 Mixed hyperlipidemia: Secondary | ICD-10-CM | POA: Diagnosis not present

## 2022-10-15 DIAGNOSIS — I10 Essential (primary) hypertension: Secondary | ICD-10-CM | POA: Diagnosis not present

## 2022-10-15 DIAGNOSIS — M1611 Unilateral primary osteoarthritis, right hip: Secondary | ICD-10-CM | POA: Diagnosis not present

## 2022-10-15 DIAGNOSIS — E039 Hypothyroidism, unspecified: Secondary | ICD-10-CM | POA: Diagnosis not present

## 2022-10-16 DIAGNOSIS — L820 Inflamed seborrheic keratosis: Secondary | ICD-10-CM | POA: Diagnosis not present

## 2022-10-16 DIAGNOSIS — L570 Actinic keratosis: Secondary | ICD-10-CM | POA: Diagnosis not present

## 2022-10-16 DIAGNOSIS — L821 Other seborrheic keratosis: Secondary | ICD-10-CM | POA: Diagnosis not present

## 2022-10-16 DIAGNOSIS — L578 Other skin changes due to chronic exposure to nonionizing radiation: Secondary | ICD-10-CM | POA: Diagnosis not present

## 2022-10-21 ENCOUNTER — Inpatient Hospital Stay: Payer: Medicare Other

## 2022-10-21 DIAGNOSIS — C50811 Malignant neoplasm of overlapping sites of right female breast: Secondary | ICD-10-CM

## 2022-10-21 DIAGNOSIS — C50919 Malignant neoplasm of unspecified site of unspecified female breast: Secondary | ICD-10-CM

## 2022-10-21 DIAGNOSIS — Z5111 Encounter for antineoplastic chemotherapy: Secondary | ICD-10-CM | POA: Diagnosis not present

## 2022-10-21 DIAGNOSIS — C7951 Secondary malignant neoplasm of bone: Secondary | ICD-10-CM | POA: Diagnosis not present

## 2022-10-21 LAB — CMP (CANCER CENTER ONLY)
ALT: 20 U/L (ref 0–44)
AST: 22 U/L (ref 15–41)
Albumin: 4.2 g/dL (ref 3.5–5.0)
Alkaline Phosphatase: 48 U/L (ref 38–126)
Anion gap: 9 (ref 5–15)
BUN: 15 mg/dL (ref 8–23)
CO2: 24 mmol/L (ref 22–32)
Calcium: 9.2 mg/dL (ref 8.9–10.3)
Chloride: 100 mmol/L (ref 98–111)
Creatinine: 0.96 mg/dL (ref 0.44–1.00)
GFR, Estimated: >60 mL/min (ref >60)
Glucose, Bld: 105 mg/dL — ABNORMAL HIGH (ref 70–99)
Potassium: 4.1 mmol/L (ref 3.5–5.1)
Sodium: 133 mmol/L — ABNORMAL LOW (ref 135–145)
Total Bilirubin: 0.6 mg/dL (ref 0.3–1.2)
Total Protein: 6.8 g/dL (ref 6.5–8.1)

## 2022-10-21 LAB — CBC WITH DIFFERENTIAL (CANCER CENTER ONLY)
Abs Immature Granulocytes: 0.03 10*3/uL (ref 0.00–0.07)
Basophils Absolute: 0.1 10*3/uL (ref 0.0–0.1)
Basophils Relative: 2 %
Eosinophils Absolute: 0.1 10*3/uL (ref 0.0–0.5)
Eosinophils Relative: 3 %
HCT: 37.9 % (ref 36.0–46.0)
Hemoglobin: 12.4 g/dL (ref 12.0–15.0)
Immature Granulocytes: 1 %
Lymphocytes Relative: 33 %
Lymphs Abs: 1 10*3/uL (ref 0.7–4.0)
MCH: 29.7 pg (ref 26.0–34.0)
MCHC: 32.7 g/dL (ref 30.0–36.0)
MCV: 90.7 fL (ref 80.0–100.0)
Monocytes Absolute: 0.4 10*3/uL (ref 0.1–1.0)
Monocytes Relative: 13 %
Neutro Abs: 1.5 10*3/uL — ABNORMAL LOW (ref 1.7–7.7)
Neutrophils Relative %: 48 %
Platelet Count: 202 10*3/uL (ref 150–400)
RBC: 4.18 MIL/uL (ref 3.87–5.11)
RDW: 15.5 % (ref 11.5–15.5)
WBC Count: 3.1 10*3/uL — ABNORMAL LOW (ref 4.0–10.5)
nRBC: 0 % (ref 0.0–0.2)

## 2022-10-22 ENCOUNTER — Encounter: Payer: Self-pay | Admitting: Oncology

## 2022-10-22 DIAGNOSIS — Z4789 Encounter for other orthopedic aftercare: Secondary | ICD-10-CM | POA: Diagnosis not present

## 2022-10-22 NOTE — Addendum Note (Signed)
Addended by: Domenic Schwab on: 10/22/2022 05:26 PM   Modules accepted: Orders

## 2022-10-23 ENCOUNTER — Inpatient Hospital Stay: Payer: Medicare Other | Attending: Oncology

## 2022-10-23 VITALS — BP 147/68 | HR 61 | Temp 97.4°F | Resp 18 | Ht 62.0 in | Wt 176.0 lb

## 2022-10-23 DIAGNOSIS — C50919 Malignant neoplasm of unspecified site of unspecified female breast: Secondary | ICD-10-CM | POA: Insufficient documentation

## 2022-10-23 DIAGNOSIS — Z170 Estrogen receptor positive status [ER+]: Secondary | ICD-10-CM | POA: Insufficient documentation

## 2022-10-23 DIAGNOSIS — C7951 Secondary malignant neoplasm of bone: Secondary | ICD-10-CM | POA: Diagnosis not present

## 2022-10-23 DIAGNOSIS — C50912 Malignant neoplasm of unspecified site of left female breast: Secondary | ICD-10-CM

## 2022-10-23 DIAGNOSIS — Z5111 Encounter for antineoplastic chemotherapy: Secondary | ICD-10-CM | POA: Insufficient documentation

## 2022-10-23 MED ORDER — DENOSUMAB 120 MG/1.7ML ~~LOC~~ SOLN
120.0000 mg | Freq: Once | SUBCUTANEOUS | Status: AC
  Administered 2022-10-23: 120 mg via SUBCUTANEOUS
  Filled 2022-10-23: qty 1.7

## 2022-10-23 MED ORDER — FULVESTRANT 250 MG/5ML IM SOSY
500.0000 mg | PREFILLED_SYRINGE | Freq: Once | INTRAMUSCULAR | Status: AC
  Administered 2022-10-23: 500 mg via INTRAMUSCULAR
  Filled 2022-10-23: qty 10

## 2022-10-23 NOTE — Patient Instructions (Signed)
Fulvestrant Injection What is this medication? FULVESTRANT (ful VES trant) treats breast cancer. It works by blocking the hormone estrogen in breast tissue, which prevents breast cancer cells from spreading or growing. This medicine may be used for other purposes; ask your health care provider or pharmacist if you have questions. COMMON BRAND NAME(S): FASLODEX What should I tell my care team before I take this medication? They need to know if you have any of these conditions: Bleeding disorder Liver disease Low blood cell levels, such as low white cells, red cells, and platelets An unusual or allergic reaction to fulvestrant, other medications, foods, dyes, or preservatives Pregnant or trying to get pregnant Breast-feeding How should I use this medication? This medication is injected into a muscle. It is given by your care team in a hospital or clinic setting. Talk to your care team about the use of this medication in children. Special care may be needed. Overdosage: If you think you have taken too much of this medicine contact a poison control center or emergency room at once. NOTE: This medicine is only for you. Do not share this medicine with others. What if I miss a dose? Keep appointments for follow-up doses. It is important not to miss your dose. Call your care team if you are unable to keep an appointment. What may interact with this medication? Certain medications that prevent or treat blood clots, such as warfarin, enoxaparin, dalteparin, apixaban, dabigatran, rivaroxaban This list may not describe all possible interactions. Give your health care provider a list of all the medicines, herbs, non-prescription drugs, or dietary supplements you use. Also tell them if you smoke, drink alcohol, or use illegal drugs. Some items may interact with your medicine. What should I watch for while using this medication? Your condition will be monitored carefully while you are receiving this  medication. You may need blood work while taking this medication. Talk to your care team if you may be pregnant. Serious birth defects can occur if you take this medication during pregnancy and for 1 year after the last dose. You will need a negative pregnancy test before starting this medication. Contraception is recommended while taking this medication and for 1 year after the last dose. Your care team can help you find the option that works for you. Do not breastfeed while taking this medication and for 1 year after the last dose. This medication may cause infertility. Talk to your care team if you are concerned about your fertility. What side effects may I notice from receiving this medication? Side effects that you should report to your care team as soon as possible: Allergic reactions or angioedema--skin rash, itching or hives, swelling of the face, eyes, lips, tongue, arms, or legs, trouble swallowing or breathing Pain, tingling, or numbness in the hands or feet Side effects that usually do not require medical attention (report to your care team if they continue or are bothersome): Bone, joint, or muscle pain Constipation Headache Hot flashes Nausea Pain, redness, or irritation at injection site Unusual weakness or fatigue This list may not describe all possible side effects. Call your doctor for medical advice about side effects. You may report side effects to FDA at 1-800-FDA-1088. Where should I keep my medication? This medication is given in a hospital or clinic. It will not be stored at home. NOTE: This sheet is a summary. It may not cover all possible information. If you have questions about this medicine, talk to your doctor, pharmacist, or health care provider.    2024 Elsevier/Gold Standard (2021-07-23 00:00:00) Denosumab Injection (Oncology) What is this medication? DENOSUMAB (den oh SUE mab) prevents weakened bones caused by cancer. It may also be used to treat noncancerous  bone tumors that cannot be removed by surgery. It can also be used to treat high calcium levels in the blood caused by cancer. It works by blocking a protein that causes bones to break down quickly. This slows down the release of calcium from bones, which lowers calcium levels in your blood. It also makes your bones stronger and less likely to break (fracture). This medicine may be used for other purposes; ask your health care provider or pharmacist if you have questions. COMMON BRAND NAME(S): XGEVA What should I tell my care team before I take this medication? They need to know if you have any of these conditions: Dental disease Having surgery or tooth extraction Infection Kidney disease Low levels of calcium or vitamin D in the blood Malnutrition On hemodialysis Skin conditions or sensitivity Thyroid or parathyroid disease An unusual reaction to denosumab, other medications, foods, dyes, or preservatives Pregnant or trying to get pregnant Breast-feeding How should I use this medication? This medication is for injection under the skin. It is given by your care team in a hospital or clinic setting. A special MedGuide will be given to you before each treatment. Be sure to read this information carefully each time. Talk to your care team about the use of this medication in children. While it may be prescribed for children as young as 13 years for selected conditions, precautions do apply. Overdosage: If you think you have taken too much of this medicine contact a poison control center or emergency room at once. NOTE: This medicine is only for you. Do not share this medicine with others. What if I miss a dose? Keep appointments for follow-up doses. It is important not to miss your dose. Call your care team if you are unable to keep an appointment. What may interact with this medication? Do not take this medication with any of the following: Other medications containing denosumab This  medication may also interact with the following: Medications that lower your chance of fighting infection Steroid medications, such as prednisone or cortisone This list may not describe all possible interactions. Give your health care provider a list of all the medicines, herbs, non-prescription drugs, or dietary supplements you use. Also tell them if you smoke, drink alcohol, or use illegal drugs. Some items may interact with your medicine. What should I watch for while using this medication? Your condition will be monitored carefully while you are receiving this medication. You may need blood work while taking this medication. This medication may increase your risk of getting an infection. Call your care team for advice if you get a fever, chills, sore throat, or other symptoms of a cold or flu. Do not treat yourself. Try to avoid being around people who are sick. You should make sure you get enough calcium and vitamin D while you are taking this medication, unless your care team tells you not to. Discuss the foods you eat and the vitamins you take with your care team. Some people who take this medication have severe bone, joint, or muscle pain. This medication may also increase your risk for jaw problems or a broken thigh bone. Tell your care team right away if you have severe pain in your jaw, bones, joints, or muscles. Tell your care team if you have any pain that does not go   away or that gets worse. Talk to your care team if you may be pregnant. Serious birth defects can occur if you take this medication during pregnancy and for 5 months after the last dose. You will need a negative pregnancy test before starting this medication. Contraception is recommended while taking this medication and for 5 months after the last dose. Your care team can help you find the option that works for you. What side effects may I notice from receiving this medication? Side effects that you should report to your care  team as soon as possible: Allergic reactions--skin rash, itching, hives, swelling of the face, lips, tongue, or throat Bone, joint, or muscle pain Low calcium level--muscle pain or cramps, confusion, tingling, or numbness in the hands or feet Osteonecrosis of the jaw--pain, swelling, or redness in the mouth, numbness of the jaw, poor healing after dental work, unusual discharge from the mouth, visible bones in the mouth Side effects that usually do not require medical attention (report to your care team if they continue or are bothersome): Cough Diarrhea Fatigue Headache Nausea This list may not describe all possible side effects. Call your doctor for medical advice about side effects. You may report side effects to FDA at 1-800-FDA-1088. Where should I keep my medication? This medication is given in a hospital or clinic. It will not be stored at home. NOTE: This sheet is a summary. It may not cover all possible information. If you have questions about this medicine, talk to your doctor, pharmacist, or health care provider.  2024 Elsevier/Gold Standard (2021-07-31 00:00:00)  

## 2022-10-28 DIAGNOSIS — E538 Deficiency of other specified B group vitamins: Secondary | ICD-10-CM | POA: Diagnosis not present

## 2022-11-04 ENCOUNTER — Other Ambulatory Visit (HOSPITAL_COMMUNITY): Payer: Self-pay

## 2022-11-05 ENCOUNTER — Other Ambulatory Visit (HOSPITAL_COMMUNITY): Payer: Self-pay

## 2022-11-18 ENCOUNTER — Inpatient Hospital Stay: Payer: Medicare Other

## 2022-11-18 ENCOUNTER — Inpatient Hospital Stay (INDEPENDENT_AMBULATORY_CARE_PROVIDER_SITE_OTHER): Payer: Medicare Other | Admitting: Hematology and Oncology

## 2022-11-18 ENCOUNTER — Encounter: Payer: Self-pay | Admitting: Hematology and Oncology

## 2022-11-18 VITALS — BP 163/88 | HR 74 | Temp 98.8°F | Resp 20 | Ht 62.0 in | Wt 176.9 lb

## 2022-11-18 DIAGNOSIS — Z5111 Encounter for antineoplastic chemotherapy: Secondary | ICD-10-CM | POA: Diagnosis not present

## 2022-11-18 DIAGNOSIS — C50919 Malignant neoplasm of unspecified site of unspecified female breast: Secondary | ICD-10-CM

## 2022-11-18 DIAGNOSIS — Z170 Estrogen receptor positive status [ER+]: Secondary | ICD-10-CM

## 2022-11-18 DIAGNOSIS — C50912 Malignant neoplasm of unspecified site of left female breast: Secondary | ICD-10-CM | POA: Diagnosis not present

## 2022-11-18 DIAGNOSIS — C7951 Secondary malignant neoplasm of bone: Secondary | ICD-10-CM

## 2022-11-18 LAB — CBC WITH DIFFERENTIAL (CANCER CENTER ONLY)
Abs Immature Granulocytes: 0.02 10*3/uL (ref 0.00–0.07)
Basophils Absolute: 0.1 10*3/uL (ref 0.0–0.1)
Basophils Relative: 2 %
Eosinophils Absolute: 0.1 10*3/uL (ref 0.0–0.5)
Eosinophils Relative: 3 %
HCT: 36.3 % (ref 36.0–46.0)
Hemoglobin: 12 g/dL (ref 12.0–15.0)
Immature Granulocytes: 1 %
Lymphocytes Relative: 31 %
Lymphs Abs: 0.8 10*3/uL (ref 0.7–4.0)
MCH: 30.2 pg (ref 26.0–34.0)
MCHC: 33.1 g/dL (ref 30.0–36.0)
MCV: 91.2 fL (ref 80.0–100.0)
Monocytes Absolute: 0.3 10*3/uL (ref 0.1–1.0)
Monocytes Relative: 14 %
Neutro Abs: 1.2 10*3/uL — ABNORMAL LOW (ref 1.7–7.7)
Neutrophils Relative %: 49 %
Platelet Count: 193 10*3/uL (ref 150–400)
RBC: 3.98 MIL/uL (ref 3.87–5.11)
RDW: 15.5 % (ref 11.5–15.5)
WBC Count: 2.5 10*3/uL — ABNORMAL LOW (ref 4.0–10.5)
nRBC: 0 % (ref 0.0–0.2)

## 2022-11-18 LAB — CMP (CANCER CENTER ONLY)
ALT: 17 U/L (ref 0–44)
AST: 23 U/L (ref 15–41)
Albumin: 4.2 g/dL (ref 3.5–5.0)
Alkaline Phosphatase: 55 U/L (ref 38–126)
Anion gap: 10 (ref 5–15)
BUN: 14 mg/dL (ref 8–23)
CO2: 23 mmol/L (ref 22–32)
Calcium: 9.7 mg/dL (ref 8.9–10.3)
Chloride: 102 mmol/L (ref 98–111)
Creatinine: 0.75 mg/dL (ref 0.44–1.00)
GFR, Estimated: >60 mL/min (ref >60)
Glucose, Bld: 138 mg/dL — ABNORMAL HIGH (ref 70–99)
Potassium: 4 mmol/L (ref 3.5–5.1)
Sodium: 135 mmol/L (ref 135–145)
Total Bilirubin: 0.4 mg/dL (ref 0.3–1.2)
Total Protein: 6.7 g/dL (ref 6.5–8.1)

## 2022-11-18 NOTE — Assessment & Plan Note (Addendum)
Patient has a complicated oncology history history of invasive ductal carcinoma of the right breast diagnosed in 1997.  She was treated with mastectomy and adjuvant chemotherapy.  She also participated in clinical trial using adjuvant high-dose chemotherapy followed by stem cell transplant.  She was treated with adjuvant tamoxifen for 5 years.  She developed biopsy-proven liver metastasis, estrogen and HER2 receptor positive, in November 2007.  She received trastuzumab for an unknown period of time.  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  She was started on zoledronic acid in July and anastrozole in August 2015.    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  Anastrozole was discontinued and she was switched to letrozole.  Palbociclib was added in January 2018 after PET imaging revealed 2 new foci at T8 and the manubrium.  The dose of palbociclib had to be decreased over time.  Zoledronic acid was discontinued in January 2019.  Letrozole was discontinued due to progression and was started on fulvestrant in February 2019 due to progression. She received palliative radiation to the cervical spine in February 2019. Palbociclib was held during radiation and resumed at 75 mg daily once completed. Denosumab was started in March 2019.    She has remained on this regimen without evidence of progression.  Most recent imaging was a PET in December 2023 which did not reveal any evidence of progression.  Her white blood count is 2.5 with an ANC of 1.2 which is fairly stable.  She is nearing the end of the cycle of palbociclib and will have her week off.  She will continue fulvestrant and denosumab every 4 to 5 weeks based on her schedule with labs.  We will plan to see her back in December with a repeat PET.

## 2022-11-18 NOTE — Progress Notes (Signed)
[facility]  [address] [place],  [place]  [address] [phone]  Clinic Day:  11/18/2022  Referring physician: Bobbye Morton, *  ASSESSMENT & PLAN:   Assessment & Plan: Primary malignant neoplasm of breast with metastasis [facility]) Patient has a complicated oncology history history of invasive ductal carcinoma of the right breast diagnosed in 1997.  She was treated with mastectomy and adjuvant chemotherapy.  She also participated in clinical trial using adjuvant high-dose chemotherapy followed by stem cell transplant.  She was treated with adjuvant tamoxifen for 5 years.  She developed biopsy-proven liver metastasis, estrogen and HER2 receptor positive, in November 2007.  She received trastuzumab for an unknown period of time.  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  She was started on zoledronic acid in July and anastrozole in August 2015.    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  Anastrozole was discontinued and she was switched to letrozole.  Palbociclib was added in January 2018 after PET imaging revealed 2 new foci at T8 and the manubrium.  The dose of palbociclib had to be decreased over time.  Zoledronic acid was discontinued in January 2019.  Letrozole was discontinued due to progression and was started on fulvestrant in February 2019 due to progression. She received palliative radiation to the cervical spine in February 2019. Palbociclib was held during radiation and resumed at 75 mg daily once completed. Denosumab was started in March 2019.    She has remained on this regimen without evidence of progression.  Most recent imaging was a PET in December 2023 which did not reveal any evidence of progression.  Her white blood count is 2.5 with an ANC of 1.2 which is fairly stable.   She is nearing the end of the cycle of palbociclib and will have her week off.  She will continue fulvestrant and denosumab every 4 to 5 weeks based on her schedule with labs.  We will plan to see her back in December with a repeat PET.      The patient understands the plans discussed today and is in agreement with them.  She knows to contact our office if she develops concerns prior to her next appointment.   I provided 30 minutes of face-to-face time during this encounter and > 50% was spent counseling as documented under my assessment and plan.    Jade Perl, PA-C  [facility] AT [facility] [address] [place] [address] Dept: [phone] Dept Fax: [phone]   Orders Placed This Encounter  Procedures   NM PET Image Initial (PI) Skull Base To Thigh    Standing Status:   Future    Standing Expiration Date:   11/18/2023    Order Specific Question:   If indicated for the ordered procedure, I authorize the administration of a radiopharmaceutical per Radiology protocol    Answer:   Yes    Order Specific Question:   Preferred imaging location?    Answer:   External      CHIEF COMPLAINT:  CC: Recurrent hormone receptor positive breast cancer  Current Treatment: Palbociclib/fulvestrant/denosumab  HISTORY OF PRESENT ILLNESS:  Jade Mooney is a 77 y.o. female who we began seeing in January 2023 for transfer of care from Dr. Ruthann Cancer for the continued treatment and  management of metastatic breast cancer. She was originally diagnosed with stage III hormone receptor positive right breast cancer in 1997.  She was treated with right mastectomy and adjuvant chemotherapy with Taxotere for 4 cycles.  She also participated in a [facility] protocol with high-dose chemotherapy, cyclophosphamide/carboplatin/BCNU, followed by stem cell rescue in September 1997. She completed 5 years of tamoxifen in November 2002.   Unfortunately, she developed metastatic liver lesions, biopsy proven metastatic breast cancer, estrogen receptor positive and HER2 receptor positive, in November 2007.  She was treated with trastuzumab for an unknown period of time.    In July 2015, she was found to have incidental findings of mixed lytic and sclerotic bony lesions on CT abdomen and pelvis. She underwent biopsy of the right iliac bone and surgical pathology confirmed invasive ductal carcinoma, grade 2, estrogen and progesterone receptor positive. Bone density from May 2015 was normal. She was started on zolendronic acid every 12 weeks in July 2015. She was also found to have three scalp lesions, biopsied in July 2015 confirming metastatic breast cancer. HER2 negative. She was started on anastrozole in August 2015.   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    Anastrozole was discontinued and she was switched to letrozole in April 2018.  Palbociclib was added in January 2018 after PET imaging revealed 2 new foci at T8 and the manubrium.   Zoledronic acid was discontinued in January 2019.  Letrozole was discontinued in February 2019 due to progression. She was started on fulvestrant in February 2019. She received palliative radiation to the cervical spine in February 2019. Palbociclib was held during that time and resumed at 75 mg daily once completed. Denosumab was started in March 2019.   She has continued on palbociclib 75 mg daily for 3 weeks on and 1 week off, along with fulvestrant and denosumab every 4 weeks.  Most recent imaging was a PET scan in December 2023 revealed significant interval change in the diffuse osteoblastic metastatic lesions in the axial and proximal appendicular skeleton without abnormal FDG avidity compatible with chronic treated disease. No evidence of hypermetabolic local recurrent breast cancer or viable  distant metastatic disease.    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  She states she continues palbociclib 75 mg for 3 weeks on and 1 week off, she will start her week off in a day or 2.  States she will need to change her injection appointments to October 3 and October 31, then the next injection until December 5.  She will then be due for repeat PET.  She denies fevers or chills. She denies pain. Her appetite is good. Her weight has been stable.  REVIEW OF SYSTEMS:  Review of Systems  Constitutional:  Positive for fatigue. Negative for appetite change, chills, fever and unexpected weight change.  HENT:   Negative for  lump/mass, mouth sores and sore throat.   Respiratory:  Negative for cough and shortness of breath.   Cardiovascular:  Negative for chest pain and leg swelling.  Gastrointestinal:  Negative for abdominal pain, constipation, diarrhea, nausea and vomiting.  Endocrine: Negative for hot flashes.  Genitourinary:  Negative for difficulty urinating, dysuria, frequency and hematuria.   Musculoskeletal:  Positive for arthralgias. Negative for back pain and myalgias.  Skin:  Negative for rash.  Neurological:  Negative for dizziness and headaches.  Hematological:  Negative for adenopathy. Does not bruise/bleed easily.  Psychiatric/Behavioral:  Negative for depression and sleep disturbance. The patient is nervous/anxious.      VITALS:  Blood  pressure (!) 163/88, pulse 74, temperature 98.8 F (37.1 C), temperature source Oral, resp. rate 20, height 5\' 2"  (1.575 m), weight 176 lb 14.4 oz (80.2 kg), SpO2 99%.  Wt Readings from Last 3 Encounters:  11/18/22 176 lb 14.4 oz (80.2 kg)  10/23/22 176 lb (79.8 kg)  09/24/22 179 lb (81.2 kg)    Body mass index is 32.36 kg/m.  Performance status (ECOG): 1 - Symptomatic but completely ambulatory  PHYSICAL EXAM:  Physical Exam Vitals and nursing note reviewed.  Constitutional:      General: She is not in acute distress.    Appearance: Normal appearance.  HENT:     Head: Normocephalic and atraumatic.     Mouth/Throat:     Mouth: Mucous membranes are moist.     Pharynx: Oropharynx is clear. No oropharyngeal exudate or posterior oropharyngeal erythema.  Eyes:     General: No scleral icterus.    Extraocular Movements: Extraocular movements intact.     Conjunctiva/sclera: Conjunctivae normal.     Pupils: Pupils are equal, round, and reactive to light.  Cardiovascular:     Rate and Rhythm: Normal rate and regular rhythm.     Heart sounds: Normal heart sounds. No murmur heard.    No friction rub. No gallop.  Pulmonary:     Effort: Pulmonary effort is normal.     Breath sounds: Normal breath sounds. No wheezing, rhonchi or rales.  Chest:  Breasts:    Right: Absent.     Left: Normal. No inverted nipple, mass, nipple discharge or skin change.     Comments: Right mastectomy site is negative Abdominal:     General: There is no distension.     Palpations: Abdomen is soft. There is no hepatomegaly, splenomegaly or mass.     Tenderness: There is no abdominal tenderness.  Musculoskeletal:        General: Normal range of motion.     Cervical back: Normal range of motion and neck supple. No tenderness.     Right lower leg: No edema.     Left lower leg: No edema.  Lymphadenopathy:     Cervical: No cervical adenopathy.     Upper Body:     Right upper body: No supraclavicular or axillary  adenopathy.     Left upper body: No supraclavicular or axillary adenopathy.     Lower Body: No right inguinal adenopathy. No left inguinal adenopathy.  Skin:    General: Skin is warm and dry.     Coloration: Skin is not jaundiced.     Findings: No rash.  Neurological:     Mental Status: She is alert and oriented to person, place, and time.     Cranial Nerves: No cranial nerve deficit.  Psychiatric:        Mood and Affect: Mood normal.  Behavior: Behavior normal.        Thought Content: Thought content normal.     LABS:      Latest Ref Rng & Units 11/18/2022   10:08 AM 10/21/2022   10:04 AM 09/22/2022   10:54 AM  CBC  WBC 4.0 - 10.5 K/uL 2.5  3.1  2.5   Hemoglobin 12.0 - 15.0 g/dL 24.4  01.0  27.2   Hematocrit 36.0 - 46.0 % 36.3  37.9  37.0   Platelets 150 - 400 K/uL 193  202  191       Latest Ref Rng & Units 11/18/2022   10:08 AM 10/21/2022   10:04 AM 09/22/2022   10:54 AM  CMP  Glucose 70 - 99 mg/dL 536  644  034   BUN 8 - 23 mg/dL 14  15  16    Creatinine 0.44 - 1.00 mg/dL 7.42  5.95  6.38   Sodium 135 - 145 mmol/L 135  133  133   Potassium 3.5 - 5.1 mmol/L 4.0  4.1  4.1   Chloride 98 - 111 mmol/L 102  100  101   CO2 22 - 32 mmol/L 23  24  22    Calcium 8.9 - 10.3 mg/dL 9.7  9.2  8.6   Total Protein 6.5 - 8.1 g/dL 6.7  6.8  6.6   Total Bilirubin 0.3 - 1.2 mg/dL 0.4  0.6  0.4   Alkaline Phos 38 - 126 U/L 55  48  47   AST 15 - 41 U/L 23  22  22    ALT 0 - 44 U/L 17  20  15       No results found for: "CEA1", "CEA" / No results found for: "CEA1", "CEA" No results found for: "PSA1" No results found for: "VFI433" No results found for: "CAN125"  No results found for: "TOTALPROTELP", "ALBUMINELP", "A1GS", "A2GS", "BETS", "BETA2SER", "GAMS", "MSPIKE", "SPEI" No results found for: "TIBC", "FERRITIN", "IRONPCTSAT" Lab Results  Component Value Date   LDH 138 01/17/2011   LDH 155 12/28/2009   LDH 155 12/29/2008    STUDIES:  No results found.    HISTORY:   Past  Medical History:  Diagnosis Date   Anxiety    Cancer [facility])    Breast 1997 right tx with mastectomy and chemo, metastatic now   GERD (gastroesophageal reflux disease)    Hypercholesterolemia    Hypertension    Hypothyroidism    Osteoarthritis    oa   Personal history of chemotherapy    Personal history of radiation therapy    Secondary malignant neoplasm of bone and bone marrow (HCC)    TIA (transient ischemic attack) last 09/10/20   x 3 total, they seem to occur every 5 years    Past Surgical History:  Procedure Laterality Date   CATARACT EXTRACTION, BILATERAL     CERVICAL FUSION  2020   MASTECTOMY MODIFIED RADICAL Right 1997   porta cath insertion  1997   later removed   radiation tx  finished 02-25-16   x 20 tx   TONSILLECTOMY     TOTAL HIP ARTHROPLASTY Left 03/12/2016   Procedure: LEFT TOTAL HIP ARTHROPLASTY ANTERIOR APPROACH;  Surgeon: Ollen Gross, MD;  Location: [facility];  Service: Orthopedics;  Laterality: Left;   TOTAL KNEE ARTHROPLASTY Left    TOTAL KNEE ARTHROPLASTY Right 10/30/2021   Procedure: TOTAL KNEE ARTHROPLASTY;  Surgeon: Eugenia Mcalpine, MD;  Location: [facility];  Service: Orthopedics;  Laterality: Right;  adductor canal 120  TUBAL LIGATION      Family History  Problem Relation Age of Onset   Breast cancer Maternal Aunt     Social History:  reports that she has never smoked. She has never used smokeless tobacco. She reports current alcohol use. She reports that she does not use drugs.The patient is alone today.  Allergies:  Allergies  Allergen Reactions   Other Nausea And Vomiting and Other (See Comments)   Oxycodone-Acetaminophen Other (See Comments)   Oxycodone-Aspirin Other (See Comments)   Erythromycin Other (See Comments)   Oxycodone Other (See Comments)   Oxycodone Hcl Nausea And Vomiting   Percocet [Oxycodone-Acetaminophen] Nausea And Vomiting   Percodan [Oxycodone-Aspirin] Nausea And Vomiting   Tramadol Itching   Amoxicillin Hives    Has  patient had a PCN reaction causing immediate rash, facial/tongue/throat swelling, SOB or lightheadedness with hypotension:unsure  Has patient had a PCN reaction causing severe rash involving mucus membranes or skin necrosis:No  Has patient had a PCN reaction that required hospitalization:No  Has patient had a PCN reaction occurring within the last 10 years: Yes  If all of the above answers are "NO", then may proceed with Cephalosporin use.  Has patient had a PCN reaction causing immediate rash, facial/tongue/throat swelling, SOB or lightheadedness with hypotension:unsure, Has patient had a PCN reaction causing severe rash involving mucus membranes or skin necrosis:No, Has patient had a PCN reaction that required hospitalization:No, Has patient had a PCN reaction occurring within the last 10 years: Yes, If all of the above answers are "NO", then may proceed with Cephalosporin use.    Current Medications: Current Outpatient Medications  Medication Sig Dispense Refill   acetaminophen (TYLENOL) 500 MG tablet Take 1,000 mg by mouth every 6 (six) hours as needed for mild pain.     acidophilus (RISAQUAD) CAPS capsule Take 1 capsule by mouth daily.     ascorbic acid (VITAMIN C) 1000 MG tablet Take 1,000 mg by mouth daily.     aspirin 325 MG tablet Take 325 mg by mouth daily.     beta carotene 15 MG capsule Take 15 mg by mouth daily.     Biotin 5000 MCG TABS Take 5,000 mcg by mouth daily.     CALCIUM CITRATE-VITAMIN D PO Take 1 tablet by mouth 3 (three) times daily. Calcium 400mg  + D 500IU three times day     clopidogrel (PLAVIX) 75 MG tablet Take 1 tablet (75 mg total) by mouth daily.     cyanocobalamin (,VITAMIN B-12,) 1000 MCG/ML injection Inject 1,000 mcg into the muscle every 30 (thirty) days.     Denosumab (XGEVA [place]) Inject 1 Dose into the skin every 30 (thirty) days.     famotidine (PEPCID) 40 MG tablet Take 40 mg by mouth daily.     glucosamine-chondroitin 500-400 MG tablet Take 1 tablet by  mouth daily.     ketotifen (ZADITOR) 0.025 % ophthalmic solution Place 1 drop into both eyes daily as needed (allergies).     levothyroxine (SYNTHROID, LEVOTHROID) 88 MCG tablet Take 88 mcg by mouth daily before breakfast.     LORazepam (ATIVAN) 1 MG tablet Take 1 tablet (1 mg total) by mouth every 8 (eight) hours as needed for anxiety. 30 tablet 0   losartan (COZAAR) 25 MG tablet Take 1 tablet by mouth daily.     Lutein 20 MG CAPS Take 20 mg by mouth daily.     meclizine (ANTIVERT) 25 MG tablet Take 25 mg by mouth 3 (three) times daily as  needed for dizziness.     melatonin 5 MG TABS Take 5 mg by mouth at bedtime as needed.     metroNIDAZOLE (METROCREAM) 0.75 % cream Apply 1 Application topically daily.     Multiple Vitamins-Minerals (MULTIVITAMIN WITH MINERALS) tablet Take 1 tablet by mouth daily.     Omega-3 1000 MG CAPS Take 1,000 mg by mouth in the morning and at bedtime.     palbociclib (IBRANCE) 75 MG tablet Take 1 tablet (75 mg total) by mouth daily. Take as directed by MD for 21 days on, 7 days off, repeat every 28 days. 21 tablet 5   pantoprazole (PROTONIX) 40 MG tablet Take 40 mg by mouth daily. Before breakfast and before supper     Polyethyl Glycol-Propyl Glycol 0.4-0.3 % SOLN Place 1-2 drops into both eyes 2 (two) times daily.     rOPINIRole (REQUIP) 2 MG tablet Take 4 mg by mouth at bedtime.     rosuvastatin (CRESTOR) 20 MG tablet Take 1 tablet (20 mg total) by mouth daily.     vitamin E 180 MG (400 UNITS) capsule Take 400 Units by mouth daily.     zinc gluconate 50 MG tablet Take 50 mg by mouth daily.     No current facility-administered medications for this visit.

## 2022-11-19 ENCOUNTER — Other Ambulatory Visit (HOSPITAL_COMMUNITY): Payer: Self-pay

## 2022-11-19 ENCOUNTER — Encounter: Payer: Self-pay | Admitting: Oncology

## 2022-11-19 ENCOUNTER — Other Ambulatory Visit: Payer: Self-pay

## 2022-11-19 LAB — CANCER ANTIGEN 27.29: CA 27.29: 32.3 U/mL (ref 0.0–38.6)

## 2022-11-20 ENCOUNTER — Inpatient Hospital Stay: Payer: Medicare Other

## 2022-11-20 VITALS — BP 145/69 | HR 63 | Temp 97.8°F | Resp 18 | Ht 62.0 in | Wt 179.0 lb

## 2022-11-20 DIAGNOSIS — Z5111 Encounter for antineoplastic chemotherapy: Secondary | ICD-10-CM | POA: Diagnosis not present

## 2022-11-20 DIAGNOSIS — C7951 Secondary malignant neoplasm of bone: Secondary | ICD-10-CM | POA: Diagnosis not present

## 2022-11-20 DIAGNOSIS — C50919 Malignant neoplasm of unspecified site of unspecified female breast: Secondary | ICD-10-CM | POA: Diagnosis not present

## 2022-11-20 DIAGNOSIS — M25551 Pain in right hip: Secondary | ICD-10-CM | POA: Diagnosis not present

## 2022-11-20 DIAGNOSIS — C50912 Malignant neoplasm of unspecified site of left female breast: Secondary | ICD-10-CM

## 2022-11-20 DIAGNOSIS — Z170 Estrogen receptor positive status [ER+]: Secondary | ICD-10-CM | POA: Diagnosis not present

## 2022-11-20 MED ORDER — DENOSUMAB 120 MG/1.7ML ~~LOC~~ SOLN
120.0000 mg | Freq: Once | SUBCUTANEOUS | Status: AC
  Administered 2022-11-20: 120 mg via SUBCUTANEOUS
  Filled 2022-11-20: qty 1.7

## 2022-11-20 MED ORDER — FULVESTRANT 250 MG/5ML IM SOSY
500.0000 mg | PREFILLED_SYRINGE | Freq: Once | INTRAMUSCULAR | Status: AC
  Administered 2022-11-20: 500 mg via INTRAMUSCULAR
  Filled 2022-11-20: qty 10

## 2022-11-20 NOTE — Patient Instructions (Signed)
Fulvestrant Injection What is this medication? FULVESTRANT (ful VES trant) treats breast cancer. It works by blocking the hormone estrogen in breast tissue, which prevents breast cancer cells from spreading or growing. This medicine may be used for other purposes; ask your health care provider or pharmacist if you have questions. COMMON BRAND NAME(S): FASLODEX What should I tell my care team before I take this medication? They need to know if you have any of these conditions: Bleeding disorder Liver disease Low blood cell levels, such as low white cells, red cells, and platelets An unusual or allergic reaction to fulvestrant, other medications, foods, dyes, or preservatives Pregnant or trying to get pregnant Breast-feeding How should I use this medication? This medication is injected into a muscle. It is given by your care team in a hospital or clinic setting. Talk to your care team about the use of this medication in children. Special care may be needed. Overdosage: If you think you have taken too much of this medicine contact a poison control center or emergency room at once. NOTE: This medicine is only for you. Do not share this medicine with others. What if I miss a dose? Keep appointments for follow-up doses. It is important not to miss your dose. Call your care team if you are unable to keep an appointment. What may interact with this medication? Certain medications that prevent or treat blood clots, such as warfarin, enoxaparin, dalteparin, apixaban, dabigatran, rivaroxaban This list may not describe all possible interactions. Give your health care provider a list of all the medicines, herbs, non-prescription drugs, or dietary supplements you use. Also tell them if you smoke, drink alcohol, or use illegal drugs. Some items may interact with your medicine. What should I watch for while using this medication? Your condition will be monitored carefully while you are receiving this  medication. You may need blood work while taking this medication. Talk to your care team if you may be pregnant. Serious birth defects can occur if you take this medication during pregnancy and for 1 year after the last dose. You will need a negative pregnancy test before starting this medication. Contraception is recommended while taking this medication and for 1 year after the last dose. Your care team can help you find the option that works for you. Do not breastfeed while taking this medication and for 1 year after the last dose. This medication may cause infertility. Talk to your care team if you are concerned about your fertility. What side effects may I notice from receiving this medication? Side effects that you should report to your care team as soon as possible: Allergic reactions or angioedema--skin rash, itching or hives, swelling of the face, eyes, lips, tongue, arms, or legs, trouble swallowing or breathing Pain, tingling, or numbness in the hands or feet Side effects that usually do not require medical attention (report to your care team if they continue or are bothersome): Bone, joint, or muscle pain Constipation Headache Hot flashes Nausea Pain, redness, or irritation at injection site Unusual weakness or fatigue This list may not describe all possible side effects. Call your doctor for medical advice about side effects. You may report side effects to FDA at 1-800-FDA-1088. Where should I keep my medication? This medication is given in a hospital or clinic. It will not be stored at home. NOTE: This sheet is a summary. It may not cover all possible information. If you have questions about this medicine, talk to your doctor, pharmacist, or health care provider.    2024 Elsevier/Gold Standard (2021-07-23 00:00:00) Denosumab Injection (Oncology) What is this medication? DENOSUMAB (den oh SUE mab) prevents weakened bones caused by cancer. It may also be used to treat noncancerous  bone tumors that cannot be removed by surgery. It can also be used to treat high calcium levels in the blood caused by cancer. It works by blocking a protein that causes bones to break down quickly. This slows down the release of calcium from bones, which lowers calcium levels in your blood. It also makes your bones stronger and less likely to break (fracture). This medicine may be used for other purposes; ask your health care provider or pharmacist if you have questions. COMMON BRAND NAME(S): XGEVA What should I tell my care team before I take this medication? They need to know if you have any of these conditions: Dental disease Having surgery or tooth extraction Infection Kidney disease Low levels of calcium or vitamin D in the blood Malnutrition On hemodialysis Skin conditions or sensitivity Thyroid or parathyroid disease An unusual reaction to denosumab, other medications, foods, dyes, or preservatives Pregnant or trying to get pregnant Breast-feeding How should I use this medication? This medication is for injection under the skin. It is given by your care team in a hospital or clinic setting. A special MedGuide will be given to you before each treatment. Be sure to read this information carefully each time. Talk to your care team about the use of this medication in children. While it may be prescribed for children as young as 13 years for selected conditions, precautions do apply. Overdosage: If you think you have taken too much of this medicine contact a poison control center or emergency room at once. NOTE: This medicine is only for you. Do not share this medicine with others. What if I miss a dose? Keep appointments for follow-up doses. It is important not to miss your dose. Call your care team if you are unable to keep an appointment. What may interact with this medication? Do not take this medication with any of the following: Other medications containing denosumab This  medication may also interact with the following: Medications that lower your chance of fighting infection Steroid medications, such as prednisone or cortisone This list may not describe all possible interactions. Give your health care provider a list of all the medicines, herbs, non-prescription drugs, or dietary supplements you use. Also tell them if you smoke, drink alcohol, or use illegal drugs. Some items may interact with your medicine. What should I watch for while using this medication? Your condition will be monitored carefully while you are receiving this medication. You may need blood work while taking this medication. This medication may increase your risk of getting an infection. Call your care team for advice if you get a fever, chills, sore throat, or other symptoms of a cold or flu. Do not treat yourself. Try to avoid being around people who are sick. You should make sure you get enough calcium and vitamin D while you are taking this medication, unless your care team tells you not to. Discuss the foods you eat and the vitamins you take with your care team. Some people who take this medication have severe bone, joint, or muscle pain. This medication may also increase your risk for jaw problems or a broken thigh bone. Tell your care team right away if you have severe pain in your jaw, bones, joints, or muscles. Tell your care team if you have any pain that does not go   away or that gets worse. Talk to your care team if you may be pregnant. Serious birth defects can occur if you take this medication during pregnancy and for 5 months after the last dose. You will need a negative pregnancy test before starting this medication. Contraception is recommended while taking this medication and for 5 months after the last dose. Your care team can help you find the option that works for you. What side effects may I notice from receiving this medication? Side effects that you should report to your care  team as soon as possible: Allergic reactions--skin rash, itching, hives, swelling of the face, lips, tongue, or throat Bone, joint, or muscle pain Low calcium level--muscle pain or cramps, confusion, tingling, or numbness in the hands or feet Osteonecrosis of the jaw--pain, swelling, or redness in the mouth, numbness of the jaw, poor healing after dental work, unusual discharge from the mouth, visible bones in the mouth Side effects that usually do not require medical attention (report to your care team if they continue or are bothersome): Cough Diarrhea Fatigue Headache Nausea This list may not describe all possible side effects. Call your doctor for medical advice about side effects. You may report side effects to FDA at 1-800-FDA-1088. Where should I keep my medication? This medication is given in a hospital or clinic. It will not be stored at home. NOTE: This sheet is a summary. It may not cover all possible information. If you have questions about this medicine, talk to your doctor, pharmacist, or health care provider.  2024 Elsevier/Gold Standard (2021-07-31 00:00:00)  

## 2022-11-21 ENCOUNTER — Other Ambulatory Visit: Payer: Self-pay | Admitting: Oncology

## 2022-11-21 DIAGNOSIS — C50919 Malignant neoplasm of unspecified site of unspecified female breast: Secondary | ICD-10-CM

## 2022-11-25 DIAGNOSIS — Z79899 Other long term (current) drug therapy: Secondary | ICD-10-CM | POA: Diagnosis not present

## 2022-11-25 DIAGNOSIS — D519 Vitamin B12 deficiency anemia, unspecified: Secondary | ICD-10-CM | POA: Diagnosis not present

## 2022-12-08 DIAGNOSIS — L300 Nummular dermatitis: Secondary | ICD-10-CM | POA: Diagnosis not present

## 2022-12-08 DIAGNOSIS — L209 Atopic dermatitis, unspecified: Secondary | ICD-10-CM | POA: Diagnosis not present

## 2022-12-08 DIAGNOSIS — L918 Other hypertrophic disorders of the skin: Secondary | ICD-10-CM | POA: Diagnosis not present

## 2022-12-11 ENCOUNTER — Other Ambulatory Visit (HOSPITAL_COMMUNITY): Payer: Self-pay

## 2022-12-16 ENCOUNTER — Other Ambulatory Visit: Payer: Medicare Other

## 2022-12-18 ENCOUNTER — Ambulatory Visit: Payer: Medicare Other

## 2022-12-22 ENCOUNTER — Inpatient Hospital Stay: Payer: Medicare Other | Attending: Oncology

## 2022-12-22 DIAGNOSIS — C50919 Malignant neoplasm of unspecified site of unspecified female breast: Secondary | ICD-10-CM | POA: Insufficient documentation

## 2022-12-22 DIAGNOSIS — Z170 Estrogen receptor positive status [ER+]: Secondary | ICD-10-CM

## 2022-12-22 DIAGNOSIS — C7951 Secondary malignant neoplasm of bone: Secondary | ICD-10-CM | POA: Insufficient documentation

## 2022-12-22 LAB — CBC WITH DIFFERENTIAL (CANCER CENTER ONLY)
Abs Immature Granulocytes: 0.01 10*3/uL (ref 0.00–0.07)
Basophils Absolute: 0.1 10*3/uL (ref 0.0–0.1)
Basophils Relative: 2 %
Eosinophils Absolute: 0.1 10*3/uL (ref 0.0–0.5)
Eosinophils Relative: 2 %
HCT: 34.6 % — ABNORMAL LOW (ref 36.0–46.0)
Hemoglobin: 11.3 g/dL — ABNORMAL LOW (ref 12.0–15.0)
Immature Granulocytes: 0 %
Lymphocytes Relative: 30 %
Lymphs Abs: 0.9 10*3/uL (ref 0.7–4.0)
MCH: 29.7 pg (ref 26.0–34.0)
MCHC: 32.7 g/dL (ref 30.0–36.0)
MCV: 90.8 fL (ref 80.0–100.0)
Monocytes Absolute: 0.3 10*3/uL (ref 0.1–1.0)
Monocytes Relative: 10 %
Neutro Abs: 1.6 10*3/uL — ABNORMAL LOW (ref 1.7–7.7)
Neutrophils Relative %: 56 %
Platelet Count: 190 10*3/uL (ref 150–400)
RBC: 3.81 MIL/uL — ABNORMAL LOW (ref 3.87–5.11)
RDW: 15.3 % (ref 11.5–15.5)
WBC Count: 2.9 10*3/uL — ABNORMAL LOW (ref 4.0–10.5)
nRBC: 0 % (ref 0.0–0.2)

## 2022-12-22 LAB — CMP (CANCER CENTER ONLY)
ALT: 17 U/L (ref 0–44)
AST: 22 U/L (ref 15–41)
Albumin: 3.9 g/dL (ref 3.5–5.0)
Alkaline Phosphatase: 53 U/L (ref 38–126)
Anion gap: 9 (ref 5–15)
BUN: 18 mg/dL (ref 8–23)
CO2: 22 mmol/L (ref 22–32)
Calcium: 8.5 mg/dL — ABNORMAL LOW (ref 8.9–10.3)
Chloride: 102 mmol/L (ref 98–111)
Creatinine: 0.87 mg/dL (ref 0.44–1.00)
GFR, Estimated: >60 mL/min (ref >60)
Glucose, Bld: 173 mg/dL — ABNORMAL HIGH (ref 70–99)
Potassium: 4 mmol/L (ref 3.5–5.1)
Sodium: 133 mmol/L — ABNORMAL LOW (ref 135–145)
Total Bilirubin: 0.6 mg/dL (ref 0.3–1.2)
Total Protein: 6.6 g/dL (ref 6.5–8.1)

## 2022-12-23 ENCOUNTER — Other Ambulatory Visit: Payer: Medicare Other

## 2022-12-23 DIAGNOSIS — M25551 Pain in right hip: Secondary | ICD-10-CM | POA: Diagnosis not present

## 2022-12-24 ENCOUNTER — Encounter: Payer: Self-pay | Admitting: Oncology

## 2022-12-24 DIAGNOSIS — E538 Deficiency of other specified B group vitamins: Secondary | ICD-10-CM | POA: Diagnosis not present

## 2022-12-24 NOTE — Addendum Note (Signed)
Addended by: Domenic Schwab on: 12/24/2022 04:13 PM   Modules accepted: Orders

## 2022-12-25 ENCOUNTER — Inpatient Hospital Stay: Payer: Medicare Other | Attending: Oncology

## 2022-12-25 ENCOUNTER — Other Ambulatory Visit (HOSPITAL_COMMUNITY): Payer: Self-pay

## 2022-12-25 VITALS — BP 158/75 | HR 71 | Temp 97.8°F | Resp 18 | Ht 62.0 in | Wt 181.0 lb

## 2022-12-25 DIAGNOSIS — C50919 Malignant neoplasm of unspecified site of unspecified female breast: Secondary | ICD-10-CM | POA: Insufficient documentation

## 2022-12-25 DIAGNOSIS — C7951 Secondary malignant neoplasm of bone: Secondary | ICD-10-CM | POA: Diagnosis not present

## 2022-12-25 DIAGNOSIS — C50912 Malignant neoplasm of unspecified site of left female breast: Secondary | ICD-10-CM

## 2022-12-25 DIAGNOSIS — Z5111 Encounter for antineoplastic chemotherapy: Secondary | ICD-10-CM | POA: Insufficient documentation

## 2022-12-25 MED ORDER — DENOSUMAB 120 MG/1.7ML ~~LOC~~ SOLN
120.0000 mg | Freq: Once | SUBCUTANEOUS | Status: AC
  Administered 2022-12-25: 120 mg via SUBCUTANEOUS
  Filled 2022-12-25: qty 1.7

## 2022-12-25 MED ORDER — FULVESTRANT 250 MG/5ML IM SOSY
500.0000 mg | PREFILLED_SYRINGE | Freq: Once | INTRAMUSCULAR | Status: AC
  Administered 2022-12-25: 500 mg via INTRAMUSCULAR
  Filled 2022-12-25: qty 10

## 2022-12-25 NOTE — Patient Instructions (Signed)
Fulvestrant Injection What is this medication? FULVESTRANT (ful VES trant) treats breast cancer. It works by blocking the hormone estrogen in breast tissue, which prevents breast cancer cells from spreading or growing. This medicine may be used for other purposes; ask your health care provider or pharmacist if you have questions. COMMON BRAND NAME(S): FASLODEX What should I tell my care team before I take this medication? They need to know if you have any of these conditions: Bleeding disorder Liver disease Low blood cell levels, such as low white cells, red cells, and platelets An unusual or allergic reaction to fulvestrant, other medications, foods, dyes, or preservatives Pregnant or trying to get pregnant Breast-feeding How should I use this medication? This medication is injected into a muscle. It is given by your care team in a hospital or clinic setting. Talk to your care team about the use of this medication in children. Special care may be needed. Overdosage: If you think you have taken too much of this medicine contact a poison control center or emergency room at once. NOTE: This medicine is only for you. Do not share this medicine with others. What if I miss a dose? Keep appointments for follow-up doses. It is important not to miss your dose. Call your care team if you are unable to keep an appointment. What may interact with this medication? Certain medications that prevent or treat blood clots, such as warfarin, enoxaparin, dalteparin, apixaban, dabigatran, rivaroxaban This list may not describe all possible interactions. Give your health care provider a list of all the medicines, herbs, non-prescription drugs, or dietary supplements you use. Also tell them if you smoke, drink alcohol, or use illegal drugs. Some items may interact with your medicine. What should I watch for while using this medication? Your condition will be monitored carefully while you are receiving this  medication. You may need blood work while taking this medication. Talk to your care team if you may be pregnant. Serious birth defects can occur if you take this medication during pregnancy and for 1 year after the last dose. You will need a negative pregnancy test before starting this medication. Contraception is recommended while taking this medication and for 1 year after the last dose. Your care team can help you find the option that works for you. Do not breastfeed while taking this medication and for 1 year after the last dose. This medication may cause infertility. Talk to your care team if you are concerned about your fertility. What side effects may I notice from receiving this medication? Side effects that you should report to your care team as soon as possible: Allergic reactions or angioedema--skin rash, itching or hives, swelling of the face, eyes, lips, tongue, arms, or legs, trouble swallowing or breathing Pain, tingling, or numbness in the hands or feet Side effects that usually do not require medical attention (report to your care team if they continue or are bothersome): Bone, joint, or muscle pain Constipation Headache Hot flashes Nausea Pain, redness, or irritation at injection site Unusual weakness or fatigue This list may not describe all possible side effects. Call your doctor for medical advice about side effects. You may report side effects to FDA at 1-800-FDA-1088. Where should I keep my medication? This medication is given in a hospital or clinic. It will not be stored at home. NOTE: This sheet is a summary. It may not cover all possible information. If you have questions about this medicine, talk to your doctor, pharmacist, or health care provider.  2024 Elsevier/Gold Standard (2021-07-23 00:00:00)   Denosumab Injection (Oncology) What is this medication? DENOSUMAB (den oh SUE mab) prevents weakened bones caused by cancer. It may also be used to treat noncancerous  bone tumors that cannot be removed by surgery. It can also be used to treat high calcium levels in the blood caused by cancer. It works by blocking a protein that causes bones to break down quickly. This slows down the release of calcium from bones, which lowers calcium levels in your blood. It also makes your bones stronger and less likely to break (fracture). This medicine may be used for other purposes; ask your health care provider or pharmacist if you have questions. COMMON BRAND NAME(S): XGEVA What should I tell my care team before I take this medication? They need to know if you have any of these conditions: Dental disease Having surgery or tooth extraction Infection Kidney disease Low levels of calcium or vitamin D in the blood Malnutrition On hemodialysis Skin conditions or sensitivity Thyroid or parathyroid disease An unusual reaction to denosumab, other medications, foods, dyes, or preservatives Pregnant or trying to get pregnant Breast-feeding How should I use this medication? This medication is for injection under the skin. It is given by your care team in a hospital or clinic setting. A special MedGuide will be given to you before each treatment. Be sure to read this information carefully each time. Talk to your care team about the use of this medication in children. While it may be prescribed for children as young as 13 years for selected conditions, precautions do apply. Overdosage: If you think you have taken too much of this medicine contact a poison control center or emergency room at once. NOTE: This medicine is only for you. Do not share this medicine with others. What if I miss a dose? Keep appointments for follow-up doses. It is important not to miss your dose. Call your care team if you are unable to keep an appointment. What may interact with this medication? Do not take this medication with any of the following: Other medications containing denosumab This  medication may also interact with the following: Medications that lower your chance of fighting infection Steroid medications, such as prednisone or cortisone This list may not describe all possible interactions. Give your health care provider a list of all the medicines, herbs, non-prescription drugs, or dietary supplements you use. Also tell them if you smoke, drink alcohol, or use illegal drugs. Some items may interact with your medicine. What should I watch for while using this medication? Your condition will be monitored carefully while you are receiving this medication. You may need blood work while taking this medication. This medication may increase your risk of getting an infection. Call your care team for advice if you get a fever, chills, sore throat, or other symptoms of a cold or flu. Do not treat yourself. Try to avoid being around people who are sick. You should make sure you get enough calcium and vitamin D while you are taking this medication, unless your care team tells you not to. Discuss the foods you eat and the vitamins you take with your care team. Some people who take this medication have severe bone, joint, or muscle pain. This medication may also increase your risk for jaw problems or a broken thigh bone. Tell your care team right away if you have severe pain in your jaw, bones, joints, or muscles. Tell your care team if you have any pain that does  not go away or that gets worse. Talk to your care team if you may be pregnant. Serious birth defects can occur if you take this medication during pregnancy and for 5 months after the last dose. You will need a negative pregnancy test before starting this medication. Contraception is recommended while taking this medication and for 5 months after the last dose. Your care team can help you find the option that works for you. What side effects may I notice from receiving this medication? Side effects that you should report to your care  team as soon as possible: Allergic reactions--skin rash, itching, hives, swelling of the face, lips, tongue, or throat Bone, joint, or muscle pain Low calcium level--muscle pain or cramps, confusion, tingling, or numbness in the hands or feet Osteonecrosis of the jaw--pain, swelling, or redness in the mouth, numbness of the jaw, poor healing after dental work, unusual discharge from the mouth, visible bones in the mouth Side effects that usually do not require medical attention (report to your care team if they continue or are bothersome): Cough Diarrhea Fatigue Headache Nausea This list may not describe all possible side effects. Call your doctor for medical advice about side effects. You may report side effects to FDA at 1-800-FDA-1088. Where should I keep my medication? This medication is given in a hospital or clinic. It will not be stored at home. NOTE: This sheet is a summary. It may not cover all possible information. If you have questions about this medicine, talk to your doctor, pharmacist, or health care provider.  2024 Elsevier/Gold Standard (2021-07-31 00:00:00) [facility]  Discharge Instructions: Thank you for choosing [facility] to provide your oncology and hematology care.  If you have a lab appointment with the Cancer Center, please go directly to the Cancer Center and check in at the registration area.   Wear comfortable clothing and clothing appropriate for easy access to any Portacath or PICC line.   We strive to give you quality time with your provider. You may need to reschedule your appointment if you arrive late (15 or more minutes).  Arriving late affects you and other patients whose appointments are after yours.  Also, if you miss three or more appointments without notifying the office, you may be dismissed from the clinic at the provider's discretion.      For prescription refill requests, have your pharmacy contact our  office and allow 72 hours for refills to be completed.    Today you received the following chemotherapy and/or immunotherapy agents FASLODEX, DENOSUMAB      To help prevent nausea and vomiting after your treatment, we encourage you to take your nausea medication as directed.  BELOW ARE SYMPTOMS THAT SHOULD BE REPORTED IMMEDIATELY: *FEVER GREATER THAN 100.4 F (38 C) OR HIGHER *CHILLS OR SWEATING *NAUSEA AND VOMITING THAT IS NOT CONTROLLED WITH YOUR NAUSEA MEDICATION *UNUSUAL SHORTNESS OF BREATH *UNUSUAL BRUISING OR BLEEDING *URINARY PROBLEMS (pain or burning when urinating, or frequent urination) *BOWEL PROBLEMS (unusual diarrhea, constipation, pain near the anus) TENDERNESS IN MOUTH AND THROAT WITH OR WITHOUT PRESENCE OF ULCERS (sore throat, sores in mouth, or a toothache) UNUSUAL RASH, SWELLING OR PAIN  UNUSUAL VAGINAL DISCHARGE OR ITCHING   Items with * indicate a potential emergency and should be followed up as soon as possible or go to the Emergency Department if any problems should occur.  Please show the CHEMOTHERAPY ALERT CARD or IMMUNOTHERAPY ALERT CARD at check-in to the Emergency Department  and triage nurse.  Should you have questions after your visit or need to cancel or reschedule your appointment, please contact [facility]  Dept: [phone]  and follow the prompts.  Office hours are 8:00 a.m. to 4:30 p.m. Monday - Friday. Please note that voicemails left after 4:00 p.m. may not be returned until the following business day.  We are closed weekends and major holidays. You have access to a nurse at all times for urgent questions. Please call the main number to the clinic Dept: [phone] and follow the prompts.  For any non-urgent questions, you may also contact your provider using MyChart. We now offer e-Visits for anyone 33 and older to request care online for non-urgent symptoms. For details visit [email or web address].   Also download the  MyChart app! Go to the app store, search "MyChart", open the app, select [place], and log in with your MyChart username and password.

## 2023-01-07 DIAGNOSIS — Z23 Encounter for immunization: Secondary | ICD-10-CM | POA: Diagnosis not present

## 2023-01-12 ENCOUNTER — Other Ambulatory Visit: Payer: Self-pay

## 2023-01-12 NOTE — Progress Notes (Signed)
Specialty Pharmacy Refill Coordination Note  Jade Mooney is a 77 y.o. female contacted today regarding refills of specialty medication(s) Palbociclib   Patient requested Delivery   Delivery date: 01/15/23   Verified address: [address]   Medication will be filled on 01/14/23.

## 2023-01-12 NOTE — Progress Notes (Signed)
Specialty Pharmacy Ongoing Clinical Assessment Note  Jade Mooney is a 77 y.o. female who is being followed by the specialty pharmacy service for RxSp Oncology   Patient's specialty medication(s) reviewed today: Palbociclib   Missed doses in the last 4 weeks: 0   Patient/Caregiver did not have any additional questions or concerns.   Therapeutic benefit summary: Patient is achieving benefit   Adverse events/side effects summary: No adverse events/side effects   Patient's therapy is appropriate to: Continue    Goals Addressed             This Visit's Progress    Slow Disease Progression       Patient is on track. Patient will maintain adherence         Follow up:  6 months  Bobette Mo Specialty Pharmacist

## 2023-01-20 ENCOUNTER — Inpatient Hospital Stay: Payer: Medicare Other

## 2023-01-20 ENCOUNTER — Other Ambulatory Visit: Payer: Medicare Other

## 2023-01-20 DIAGNOSIS — Z5111 Encounter for antineoplastic chemotherapy: Secondary | ICD-10-CM | POA: Diagnosis not present

## 2023-01-20 DIAGNOSIS — C7951 Secondary malignant neoplasm of bone: Secondary | ICD-10-CM | POA: Diagnosis not present

## 2023-01-20 DIAGNOSIS — C50811 Malignant neoplasm of overlapping sites of right female breast: Secondary | ICD-10-CM

## 2023-01-20 DIAGNOSIS — C50919 Malignant neoplasm of unspecified site of unspecified female breast: Secondary | ICD-10-CM | POA: Diagnosis not present

## 2023-01-20 LAB — CMP (CANCER CENTER ONLY)
ALT: 18 U/L (ref 0–44)
AST: 22 U/L (ref 15–41)
Albumin: 4.4 g/dL (ref 3.5–5.0)
Alkaline Phosphatase: 51 U/L (ref 38–126)
Anion gap: 10 (ref 5–15)
BUN: 13 mg/dL (ref 8–23)
CO2: 22 mmol/L (ref 22–32)
Calcium: 9 mg/dL (ref 8.9–10.3)
Chloride: 99 mmol/L (ref 98–111)
Creatinine: 0.75 mg/dL (ref 0.44–1.00)
GFR, Estimated: >60 mL/min (ref >60)
Glucose, Bld: 86 mg/dL (ref 70–99)
Potassium: 4.1 mmol/L (ref 3.5–5.1)
Sodium: 131 mmol/L — ABNORMAL LOW (ref 135–145)
Total Bilirubin: 0.6 mg/dL (ref 0.3–1.2)
Total Protein: 7.3 g/dL (ref 6.5–8.1)

## 2023-01-20 LAB — CBC WITH DIFFERENTIAL (CANCER CENTER ONLY)
Abs Immature Granulocytes: 0.03 10*3/uL (ref 0.00–0.07)
Basophils Absolute: 0.1 10*3/uL (ref 0.0–0.1)
Basophils Relative: 1 %
Eosinophils Absolute: 0.1 10*3/uL (ref 0.0–0.5)
Eosinophils Relative: 2 %
HCT: 36.1 % (ref 36.0–46.0)
Hemoglobin: 12 g/dL (ref 12.0–15.0)
Immature Granulocytes: 1 %
Lymphocytes Relative: 18 %
Lymphs Abs: 0.8 10*3/uL (ref 0.7–4.0)
MCH: 30.8 pg (ref 26.0–34.0)
MCHC: 33.2 g/dL (ref 30.0–36.0)
MCV: 92.6 fL (ref 80.0–100.0)
Monocytes Absolute: 0.7 10*3/uL (ref 0.1–1.0)
Monocytes Relative: 15 %
Neutro Abs: 2.7 10*3/uL (ref 1.7–7.7)
Neutrophils Relative %: 63 %
Platelet Count: 233 10*3/uL (ref 150–400)
RBC: 3.9 MIL/uL (ref 3.87–5.11)
RDW: 15.7 % — ABNORMAL HIGH (ref 11.5–15.5)
WBC Count: 4.3 10*3/uL (ref 4.0–10.5)
nRBC: 0 % (ref 0.0–0.2)

## 2023-01-21 DIAGNOSIS — Z6830 Body mass index (BMI) 30.0-30.9, adult: Secondary | ICD-10-CM | POA: Diagnosis not present

## 2023-01-21 DIAGNOSIS — I10 Essential (primary) hypertension: Secondary | ICD-10-CM | POA: Diagnosis not present

## 2023-01-21 DIAGNOSIS — J329 Chronic sinusitis, unspecified: Secondary | ICD-10-CM | POA: Diagnosis not present

## 2023-01-21 DIAGNOSIS — J40 Bronchitis, not specified as acute or chronic: Secondary | ICD-10-CM | POA: Diagnosis not present

## 2023-01-22 ENCOUNTER — Inpatient Hospital Stay: Payer: Medicare Other

## 2023-01-22 VITALS — BP 140/81 | HR 80 | Temp 97.9°F | Resp 18 | Ht 62.0 in | Wt 179.4 lb

## 2023-01-22 DIAGNOSIS — C50919 Malignant neoplasm of unspecified site of unspecified female breast: Secondary | ICD-10-CM

## 2023-01-22 DIAGNOSIS — C50912 Malignant neoplasm of unspecified site of left female breast: Secondary | ICD-10-CM

## 2023-01-22 DIAGNOSIS — Z5111 Encounter for antineoplastic chemotherapy: Secondary | ICD-10-CM | POA: Diagnosis not present

## 2023-01-22 DIAGNOSIS — C7951 Secondary malignant neoplasm of bone: Secondary | ICD-10-CM | POA: Diagnosis not present

## 2023-01-22 MED ORDER — FULVESTRANT 250 MG/5ML IM SOSY
500.0000 mg | PREFILLED_SYRINGE | Freq: Once | INTRAMUSCULAR | Status: AC
  Administered 2023-01-22: 500 mg via INTRAMUSCULAR
  Filled 2023-01-22: qty 10

## 2023-01-22 MED ORDER — DENOSUMAB 120 MG/1.7ML ~~LOC~~ SOLN
120.0000 mg | Freq: Once | SUBCUTANEOUS | Status: AC
  Administered 2023-01-22: 120 mg via SUBCUTANEOUS
  Filled 2023-01-22: qty 1.7

## 2023-01-22 NOTE — Patient Instructions (Signed)
Fulvestrant Injection What is this medication? FULVESTRANT (ful VES trant) treats breast cancer. It works by blocking the hormone estrogen in breast tissue, which prevents breast cancer cells from spreading or growing. This medicine may be used for other purposes; ask your health care provider or pharmacist if you have questions. COMMON BRAND NAME(S): FASLODEX What should I tell my care team before I take this medication? They need to know if you have any of these conditions: Bleeding disorder Liver disease Low blood cell levels, such as low white cells, red cells, and platelets An unusual or allergic reaction to fulvestrant, other medications, foods, dyes, or preservatives Pregnant or trying to get pregnant Breast-feeding How should I use this medication? This medication is injected into a muscle. It is given by your care team in a hospital or clinic setting. Talk to your care team about the use of this medication in children. Special care may be needed. Overdosage: If you think you have taken too much of this medicine contact a poison control center or emergency room at once. NOTE: This medicine is only for you. Do not share this medicine with others. What if I miss a dose? Keep appointments for follow-up doses. It is important not to miss your dose. Call your care team if you are unable to keep an appointment. What may interact with this medication? Certain medications that prevent or treat blood clots, such as warfarin, enoxaparin, dalteparin, apixaban, dabigatran, rivaroxaban This list may not describe all possible interactions. Give your health care provider a list of all the medicines, herbs, non-prescription drugs, or dietary supplements you use. Also tell them if you smoke, drink alcohol, or use illegal drugs. Some items may interact with your medicine. What should I watch for while using this medication? Your condition will be monitored carefully while you are receiving this  medication. You may need blood work while taking this medication. Talk to your care team if you may be pregnant. Serious birth defects can occur if you take this medication during pregnancy and for 1 year after the last dose. You will need a negative pregnancy test before starting this medication. Contraception is recommended while taking this medication and for 1 year after the last dose. Your care team can help you find the option that works for you. Do not breastfeed while taking this medication and for 1 year after the last dose. This medication may cause infertility. Talk to your care team if you are concerned about your fertility. What side effects may I notice from receiving this medication? Side effects that you should report to your care team as soon as possible: Allergic reactions or angioedema--skin rash, itching or hives, swelling of the face, eyes, lips, tongue, arms, or legs, trouble swallowing or breathing Pain, tingling, or numbness in the hands or feet Side effects that usually do not require medical attention (report to your care team if they continue or are bothersome): Bone, joint, or muscle pain Constipation Headache Hot flashes Nausea Pain, redness, or irritation at injection site Unusual weakness or fatigue This list may not describe all possible side effects. Call your doctor for medical advice about side effects. You may report side effects to FDA at 1-800-FDA-1088. Where should I keep my medication? This medication is given in a hospital or clinic. It will not be stored at home. NOTE: This sheet is a summary. It may not cover all possible information. If you have questions about this medicine, talk to your doctor, pharmacist, or health care provider.    2024 Elsevier/Gold Standard (2021-07-23 00:00:00) Denosumab Injection (Oncology) What is this medication? DENOSUMAB (den oh SUE mab) prevents weakened bones caused by cancer. It may also be used to treat noncancerous  bone tumors that cannot be removed by surgery. It can also be used to treat high calcium levels in the blood caused by cancer. It works by blocking a protein that causes bones to break down quickly. This slows down the release of calcium from bones, which lowers calcium levels in your blood. It also makes your bones stronger and less likely to break (fracture). This medicine may be used for other purposes; ask your health care provider or pharmacist if you have questions. COMMON BRAND NAME(S): XGEVA What should I tell my care team before I take this medication? They need to know if you have any of these conditions: Dental disease Having surgery or tooth extraction Infection Kidney disease Low levels of calcium or vitamin D in the blood Malnutrition On hemodialysis Skin conditions or sensitivity Thyroid or parathyroid disease An unusual reaction to denosumab, other medications, foods, dyes, or preservatives Pregnant or trying to get pregnant Breast-feeding How should I use this medication? This medication is for injection under the skin. It is given by your care team in a hospital or clinic setting. A special MedGuide will be given to you before each treatment. Be sure to read this information carefully each time. Talk to your care team about the use of this medication in children. While it may be prescribed for children as young as 13 years for selected conditions, precautions do apply. Overdosage: If you think you have taken too much of this medicine contact a poison control center or emergency room at once. NOTE: This medicine is only for you. Do not share this medicine with others. What if I miss a dose? Keep appointments for follow-up doses. It is important not to miss your dose. Call your care team if you are unable to keep an appointment. What may interact with this medication? Do not take this medication with any of the following: Other medications containing denosumab This  medication may also interact with the following: Medications that lower your chance of fighting infection Steroid medications, such as prednisone or cortisone This list may not describe all possible interactions. Give your health care provider a list of all the medicines, herbs, non-prescription drugs, or dietary supplements you use. Also tell them if you smoke, drink alcohol, or use illegal drugs. Some items may interact with your medicine. What should I watch for while using this medication? Your condition will be monitored carefully while you are receiving this medication. You may need blood work while taking this medication. This medication may increase your risk of getting an infection. Call your care team for advice if you get a fever, chills, sore throat, or other symptoms of a cold or flu. Do not treat yourself. Try to avoid being around people who are sick. You should make sure you get enough calcium and vitamin D while you are taking this medication, unless your care team tells you not to. Discuss the foods you eat and the vitamins you take with your care team. Some people who take this medication have severe bone, joint, or muscle pain. This medication may also increase your risk for jaw problems or a broken thigh bone. Tell your care team right away if you have severe pain in your jaw, bones, joints, or muscles. Tell your care team if you have any pain that does not go   away or that gets worse. Talk to your care team if you may be pregnant. Serious birth defects can occur if you take this medication during pregnancy and for 5 months after the last dose. You will need a negative pregnancy test before starting this medication. Contraception is recommended while taking this medication and for 5 months after the last dose. Your care team can help you find the option that works for you. What side effects may I notice from receiving this medication? Side effects that you should report to your care  team as soon as possible: Allergic reactions--skin rash, itching, hives, swelling of the face, lips, tongue, or throat Bone, joint, or muscle pain Low calcium level--muscle pain or cramps, confusion, tingling, or numbness in the hands or feet Osteonecrosis of the jaw--pain, swelling, or redness in the mouth, numbness of the jaw, poor healing after dental work, unusual discharge from the mouth, visible bones in the mouth Side effects that usually do not require medical attention (report to your care team if they continue or are bothersome): Cough Diarrhea Fatigue Headache Nausea This list may not describe all possible side effects. Call your doctor for medical advice about side effects. You may report side effects to FDA at 1-800-FDA-1088. Where should I keep my medication? This medication is given in a hospital or clinic. It will not be stored at home. NOTE: This sheet is a summary. It may not cover all possible information. If you have questions about this medicine, talk to your doctor, pharmacist, or health care provider.  2024 Elsevier/Gold Standard (2021-07-31 00:00:00)  

## 2023-01-23 DIAGNOSIS — E538 Deficiency of other specified B group vitamins: Secondary | ICD-10-CM | POA: Diagnosis not present

## 2023-02-06 DIAGNOSIS — M1611 Unilateral primary osteoarthritis, right hip: Secondary | ICD-10-CM | POA: Diagnosis not present

## 2023-02-23 ENCOUNTER — Other Ambulatory Visit: Payer: Self-pay | Admitting: Hematology and Oncology

## 2023-02-23 DIAGNOSIS — C50919 Malignant neoplasm of unspecified site of unspecified female breast: Secondary | ICD-10-CM

## 2023-02-23 DIAGNOSIS — C50912 Malignant neoplasm of unspecified site of left female breast: Secondary | ICD-10-CM

## 2023-02-24 ENCOUNTER — Inpatient Hospital Stay: Payer: Medicare Other

## 2023-02-24 ENCOUNTER — Inpatient Hospital Stay: Payer: Medicare Other | Attending: Oncology

## 2023-02-24 ENCOUNTER — Other Ambulatory Visit: Payer: Medicare Other

## 2023-02-24 VITALS — BP 145/93 | HR 60 | Temp 97.7°F | Resp 18 | Ht 62.0 in

## 2023-02-24 DIAGNOSIS — C50919 Malignant neoplasm of unspecified site of unspecified female breast: Secondary | ICD-10-CM

## 2023-02-24 DIAGNOSIS — Z5111 Encounter for antineoplastic chemotherapy: Secondary | ICD-10-CM | POA: Diagnosis not present

## 2023-02-24 DIAGNOSIS — C7951 Secondary malignant neoplasm of bone: Secondary | ICD-10-CM | POA: Diagnosis not present

## 2023-02-24 DIAGNOSIS — Z170 Estrogen receptor positive status [ER+]: Secondary | ICD-10-CM | POA: Insufficient documentation

## 2023-02-24 DIAGNOSIS — E538 Deficiency of other specified B group vitamins: Secondary | ICD-10-CM | POA: Diagnosis not present

## 2023-02-24 DIAGNOSIS — C50912 Malignant neoplasm of unspecified site of left female breast: Secondary | ICD-10-CM

## 2023-02-24 DIAGNOSIS — C787 Secondary malignant neoplasm of liver and intrahepatic bile duct: Secondary | ICD-10-CM | POA: Insufficient documentation

## 2023-02-24 LAB — CMP (CANCER CENTER ONLY)
ALT: 14 U/L (ref 0–44)
AST: 23 U/L (ref 15–41)
Albumin: 4.3 g/dL (ref 3.5–5.0)
Alkaline Phosphatase: 58 U/L (ref 38–126)
Anion gap: 13 (ref 5–15)
BUN: 15 mg/dL (ref 8–23)
CO2: 23 mmol/L (ref 22–32)
Calcium: 9.8 mg/dL (ref 8.9–10.3)
Chloride: 99 mmol/L (ref 98–111)
Creatinine: 0.89 mg/dL (ref 0.44–1.00)
GFR, Estimated: >60 mL/min (ref >60)
Glucose, Bld: 113 mg/dL — ABNORMAL HIGH (ref 70–99)
Potassium: 4.1 mmol/L (ref 3.5–5.1)
Sodium: 135 mmol/L (ref 135–145)
Total Bilirubin: 0.4 mg/dL (ref <1.2)
Total Protein: 6.6 g/dL (ref 6.5–8.1)

## 2023-02-24 LAB — CBC WITH DIFFERENTIAL (CANCER CENTER ONLY)
Abs Immature Granulocytes: 0.03 10*3/uL (ref 0.00–0.07)
Basophils Absolute: 0.1 10*3/uL (ref 0.0–0.1)
Basophils Relative: 2 %
Eosinophils Absolute: 0.1 10*3/uL (ref 0.0–0.5)
Eosinophils Relative: 2 %
HCT: 34.8 % — ABNORMAL LOW (ref 36.0–46.0)
Hemoglobin: 12 g/dL (ref 12.0–15.0)
Immature Granulocytes: 1 %
Lymphocytes Relative: 28 %
Lymphs Abs: 0.9 10*3/uL (ref 0.7–4.0)
MCH: 31.1 pg (ref 26.0–34.0)
MCHC: 34.5 g/dL (ref 30.0–36.0)
MCV: 90.2 fL (ref 80.0–100.0)
Monocytes Absolute: 0.4 10*3/uL (ref 0.1–1.0)
Monocytes Relative: 12 %
Neutro Abs: 1.8 10*3/uL (ref 1.7–7.7)
Neutrophils Relative %: 55 %
Platelet Count: 220 10*3/uL (ref 150–400)
RBC: 3.86 MIL/uL — ABNORMAL LOW (ref 3.87–5.11)
RDW: 15.9 % — ABNORMAL HIGH (ref 11.5–15.5)
WBC Count: 3.3 10*3/uL — ABNORMAL LOW (ref 4.0–10.5)
nRBC: 0 % (ref 0.0–0.2)
nRBC: 0 /100{WBCs}

## 2023-02-24 MED ORDER — FULVESTRANT 250 MG/5ML IM SOSY
500.0000 mg | PREFILLED_SYRINGE | Freq: Once | INTRAMUSCULAR | Status: AC
  Administered 2023-02-24: 500 mg via INTRAMUSCULAR
  Filled 2023-02-24: qty 10

## 2023-02-24 MED ORDER — DENOSUMAB 120 MG/1.7ML ~~LOC~~ SOLN
120.0000 mg | Freq: Once | SUBCUTANEOUS | Status: AC
  Administered 2023-02-24: 120 mg via SUBCUTANEOUS
  Filled 2023-02-24: qty 1.7

## 2023-02-24 NOTE — Patient Instructions (Signed)
Fulvestrant Injection What is this medication? FULVESTRANT (ful VES trant) treats breast cancer. It works by blocking the hormone estrogen in breast tissue, which prevents breast cancer cells from spreading or growing. This medicine may be used for other purposes; ask your health care provider or pharmacist if you have questions. COMMON BRAND NAME(S): FASLODEX What should I tell my care team before I take this medication? They need to know if you have any of these conditions: Bleeding disorder Liver disease Low blood cell levels, such as low white cells, red cells, and platelets An unusual or allergic reaction to fulvestrant, other medications, foods, dyes, or preservatives Pregnant or trying to get pregnant Breast-feeding How should I use this medication? This medication is injected into a muscle. It is given by your care team in a hospital or clinic setting. Talk to your care team about the use of this medication in children. Special care may be needed. Overdosage: If you think you have taken too much of this medicine contact a poison control center or emergency room at once. NOTE: This medicine is only for you. Do not share this medicine with others. What if I miss a dose? Keep appointments for follow-up doses. It is important not to miss your dose. Call your care team if you are unable to keep an appointment. What may interact with this medication? Certain medications that prevent or treat blood clots, such as warfarin, enoxaparin, dalteparin, apixaban, dabigatran, rivaroxaban This list may not describe all possible interactions. Give your health care provider a list of all the medicines, herbs, non-prescription drugs, or dietary supplements you use. Also tell them if you smoke, drink alcohol, or use illegal drugs. Some items may interact with your medicine. What should I watch for while using this medication? Your condition will be monitored carefully while you are receiving this  medication. You may need blood work while taking this medication. Talk to your care team if you may be pregnant. Serious birth defects can occur if you take this medication during pregnancy and for 1 year after the last dose. You will need a negative pregnancy test before starting this medication. Contraception is recommended while taking this medication and for 1 year after the last dose. Your care team can help you find the option that works for you. Do not breastfeed while taking this medication and for 1 year after the last dose. This medication may cause infertility. Talk to your care team if you are concerned about your fertility. What side effects may I notice from receiving this medication? Side effects that you should report to your care team as soon as possible: Allergic reactions or angioedema--skin rash, itching or hives, swelling of the face, eyes, lips, tongue, arms, or legs, trouble swallowing or breathing Pain, tingling, or numbness in the hands or feet Side effects that usually do not require medical attention (report to your care team if they continue or are bothersome): Bone, joint, or muscle pain Constipation Headache Hot flashes Nausea Pain, redness, or irritation at injection site Unusual weakness or fatigue This list may not describe all possible side effects. Call your doctor for medical advice about side effects. You may report side effects to FDA at 1-800-FDA-1088. Where should I keep my medication? This medication is given in a hospital or clinic. It will not be stored at home. NOTE: This sheet is a summary. It may not cover all possible information. If you have questions about this medicine, talk to your doctor, pharmacist, or health care provider.    2024 Elsevier/Gold Standard (2021-07-23 00:00:00) Denosumab Injection (Oncology) What is this medication? DENOSUMAB (den oh SUE mab) prevents weakened bones caused by cancer. It may also be used to treat noncancerous  bone tumors that cannot be removed by surgery. It can also be used to treat high calcium levels in the blood caused by cancer. It works by blocking a protein that causes bones to break down quickly. This slows down the release of calcium from bones, which lowers calcium levels in your blood. It also makes your bones stronger and less likely to break (fracture). This medicine may be used for other purposes; ask your health care provider or pharmacist if you have questions. COMMON BRAND NAME(S): XGEVA What should I tell my care team before I take this medication? They need to know if you have any of these conditions: Dental disease Having surgery or tooth extraction Infection Kidney disease Low levels of calcium or vitamin D in the blood Malnutrition On hemodialysis Skin conditions or sensitivity Thyroid or parathyroid disease An unusual reaction to denosumab, other medications, foods, dyes, or preservatives Pregnant or trying to get pregnant Breast-feeding How should I use this medication? This medication is for injection under the skin. It is given by your care team in a hospital or clinic setting. A special MedGuide will be given to you before each treatment. Be sure to read this information carefully each time. Talk to your care team about the use of this medication in children. While it may be prescribed for children as young as 13 years for selected conditions, precautions do apply. Overdosage: If you think you have taken too much of this medicine contact a poison control center or emergency room at once. NOTE: This medicine is only for you. Do not share this medicine with others. What if I miss a dose? Keep appointments for follow-up doses. It is important not to miss your dose. Call your care team if you are unable to keep an appointment. What may interact with this medication? Do not take this medication with any of the following: Other medications containing denosumab This  medication may also interact with the following: Medications that lower your chance of fighting infection Steroid medications, such as prednisone or cortisone This list may not describe all possible interactions. Give your health care provider a list of all the medicines, herbs, non-prescription drugs, or dietary supplements you use. Also tell them if you smoke, drink alcohol, or use illegal drugs. Some items may interact with your medicine. What should I watch for while using this medication? Your condition will be monitored carefully while you are receiving this medication. You may need blood work while taking this medication. This medication may increase your risk of getting an infection. Call your care team for advice if you get a fever, chills, sore throat, or other symptoms of a cold or flu. Do not treat yourself. Try to avoid being around people who are sick. You should make sure you get enough calcium and vitamin D while you are taking this medication, unless your care team tells you not to. Discuss the foods you eat and the vitamins you take with your care team. Some people who take this medication have severe bone, joint, or muscle pain. This medication may also increase your risk for jaw problems or a broken thigh bone. Tell your care team right away if you have severe pain in your jaw, bones, joints, or muscles. Tell your care team if you have any pain that does not go   away or that gets worse. Talk to your care team if you may be pregnant. Serious birth defects can occur if you take this medication during pregnancy and for 5 months after the last dose. You will need a negative pregnancy test before starting this medication. Contraception is recommended while taking this medication and for 5 months after the last dose. Your care team can help you find the option that works for you. What side effects may I notice from receiving this medication? Side effects that you should report to your care  team as soon as possible: Allergic reactions--skin rash, itching, hives, swelling of the face, lips, tongue, or throat Bone, joint, or muscle pain Low calcium level--muscle pain or cramps, confusion, tingling, or numbness in the hands or feet Osteonecrosis of the jaw--pain, swelling, or redness in the mouth, numbness of the jaw, poor healing after dental work, unusual discharge from the mouth, visible bones in the mouth Side effects that usually do not require medical attention (report to your care team if they continue or are bothersome): Cough Diarrhea Fatigue Headache Nausea This list may not describe all possible side effects. Call your doctor for medical advice about side effects. You may report side effects to FDA at 1-800-FDA-1088. Where should I keep my medication? This medication is given in a hospital or clinic. It will not be stored at home. NOTE: This sheet is a summary. It may not cover all possible information. If you have questions about this medicine, talk to your doctor, pharmacist, or health care provider.  2024 Elsevier/Gold Standard (2021-07-31 00:00:00)  

## 2023-02-25 DIAGNOSIS — J069 Acute upper respiratory infection, unspecified: Secondary | ICD-10-CM | POA: Diagnosis not present

## 2023-02-25 DIAGNOSIS — Z6832 Body mass index (BMI) 32.0-32.9, adult: Secondary | ICD-10-CM | POA: Diagnosis not present

## 2023-02-26 ENCOUNTER — Ambulatory Visit: Payer: Medicare Other

## 2023-02-27 ENCOUNTER — Other Ambulatory Visit: Payer: Self-pay

## 2023-02-27 ENCOUNTER — Other Ambulatory Visit: Payer: Self-pay | Admitting: Oncology

## 2023-02-27 NOTE — Progress Notes (Signed)
Specialty Pharmacy Refill Coordination Note  Jade Mooney is a 77 y.o. female contacted today regarding refills of specialty medication(s) Palbociclib   Patient requested Delivery   Delivery date: 03/04/23   Verified address: [address]   Medication will be filled on 03/03/23, pending refill approval.

## 2023-03-02 DIAGNOSIS — M25551 Pain in right hip: Secondary | ICD-10-CM | POA: Diagnosis not present

## 2023-03-02 DIAGNOSIS — H353131 Nonexudative age-related macular degeneration, bilateral, early dry stage: Secondary | ICD-10-CM | POA: Diagnosis not present

## 2023-03-02 MED ORDER — PALBOCICLIB 75 MG PO TABS
75.0000 mg | ORAL_TABLET | Freq: Every day | ORAL | 5 refills | Status: DC
  Filled 2023-03-03: qty 21, 28d supply, fill #0
  Filled 2023-04-15: qty 21, 28d supply, fill #1
  Filled 2023-06-03: qty 21, 28d supply, fill #2
  Filled 2023-08-18: qty 21, 28d supply, fill #3
  Filled 2023-10-05: qty 21, 28d supply, fill #4
  Filled 2023-12-01: qty 21, 28d supply, fill #5

## 2023-03-03 ENCOUNTER — Other Ambulatory Visit: Payer: Self-pay

## 2023-03-03 ENCOUNTER — Other Ambulatory Visit (HOSPITAL_COMMUNITY): Payer: Self-pay

## 2023-03-06 ENCOUNTER — Other Ambulatory Visit: Payer: Self-pay | Admitting: Pharmacist

## 2023-03-10 ENCOUNTER — Ambulatory Visit: Payer: Medicare Other

## 2023-03-10 ENCOUNTER — Telehealth: Payer: Self-pay | Admitting: Oncology

## 2023-03-10 ENCOUNTER — Other Ambulatory Visit: Payer: Medicare Other

## 2023-03-10 NOTE — Telephone Encounter (Signed)
Patient has been scheduled. Aware of appt date and time.    Scheduling Message Entered by Domenic Schwab on 03/06/2023 at  3:17 PM Priority: Routine <No visit type provided>  Department: [facility]  Provider:  Appointment Notes:  Pt needs more injections and labs scheduled.  Next dose due 12/31.

## 2023-03-11 NOTE — Progress Notes (Addendum)
[facility]  [address] [place],  [place]  [address] [phone]  Clinic Day: 03/12/23  Referring physician: Bobbye Morton, *  ASSESSMENT & PLAN:   Assessment & Plan: Primary malignant neoplasm of breast with metastasis [facility]) Patient has a complicated oncology history history of invasive ductal carcinoma of the right breast diagnosed in 1997.  She was treated with mastectomy and adjuvant chemotherapy.  She also participated in clinical trial using adjuvant high-dose chemotherapy followed by stem cell transplant.  She was treated with adjuvant tamoxifen for 5 years.  She developed biopsy-proven liver metastasis, estrogen and HER2 receptor positive, in November 2007.  She received trastuzumab for an unknown period of time.  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  She was started on zoledronic acid in July and anastrozole in August, 2015.    She developed a metastatic lesion of the right anterior chest in August, 2017 confirmed to be adenocarcinoma and treated with excision and radiation.  Anastrozole was discontinued and she was switched to letrozole.  Palbociclib was added in January, 2018 after PET imaging revealed 2 new foci at T8 and the manubrium.  The dose of palbociclib had to be decreased over time.  Zoledronic acid was discontinued in January, 2019.  Letrozole was discontinued due to progression and was started on fulvestrant in February 2019 due to progression. She received palliative radiation to the cervical spine in February, 2019. Palbociclib was held during radiation and resumed at 75 mg daily once completed. Denosumab was started in March, 2019.  She has remained on this regimen without evidence of progression.  Most recent imaging was a PET in December 2023 which did not reveal any evidence of progression.  She is scheduled for repeat January 8th.   Severe Osteoarthritis She has already  had knee replacement and now will require right hip replacement. I will clear her for surgery from my standpoint. 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   Plan:  She has been cleared for hip replacement surgery which is now scheduled in March, 2025. I will order hydrocodone 5mg  every 4hrs but advised she limit this to 1-2 a day. She did start her [name] this week. She had a MRI of the right hip done on 03/02/2023 that revealed severe osteoarthritis of the right hip, large right hip joint effusion with synovitis, and innumerable small sub-centimeter bone lesions throughout the visualized pelvis and proximal femur concerning for metastatic disease given the patient's history of breast cancer. We will plan to hold her Ibrance prior to surgery.. She is currently on Faslodex/Denosumab injections and I advised she continue this to help protect her bones. Her next injection is scheduled on 03/24/2023. Her PET scan was rescheduled for 04/01/2023. She had a low WBC of 3.3, hemoglobin of 12.0, platelet count of 220,000, and a normal CMP that was done on 02/24/2023. She will be due for repeat CBC, CMP, and CA 27.29 on 03/24/2023 and have this done every 4 weeks. I will see her back on 05/18/2022 with CBC, CMP, CA 27.29, and Faslodex/Xgeva injection. The patient understands the plans discussed today and is in agreement with them.  She knows to contact our office if she develops concerns prior to her next appointment.  I provided 40 minutes of face-to-face time during this encounter and > 50% was spent counseling as documented under my assessment and plan.  Dellia Beckwith, MD [facility] - A DEPT OF [facility] [address] [place] [address] Dept: [phone] Dept Fax: [phone]   No orders of the defined types were placed in this encounter.   CHIEF COMPLAINT:  CC:  Recurrent hormone receptor positive breast cancer   Current Treatment:  Palbociclib/fulvestrant/denosumab   HISTORY OF PRESENT ILLNESS:  Jade Mooney is a 77 y.o. female who we began seeing in January 2023 for transfer of care from Dr. Ruthann Cancer for the continued treatment and management of metastatic breast cancer. She was originally diagnosed with stage III hormone receptor positive right breast cancer in 1997.  She was treated with right mastectomy and adjuvant chemotherapy with Taxotere for 4 cycles.  She also participated in a [facility] protocol with high-dose chemotherapy, cyclophosphamide/carboplatin/BCNU, followed by stem cell rescue in September 1997. She completed 5 years of tamoxifen in November 2002.  Unfortunately, she developed metastatic liver lesions, biopsy proven metastatic breast cancer, estrogen receptor positive and HER2 receptor positive, in November 2007.  She was treated with trastuzumab for an unknown period of time.    In July 2015, she was found to have incidental findings of mixed lytic and sclerotic bony lesions on CT abdomen and pelvis. She underwent biopsy of the right iliac bone and surgical pathology confirmed invasive ductal carcinoma, grade 2, estrogen and progesterone receptor positive. Bone density from May 2015 was normal. She was started on zolendronic acid every 12 weeks in July 2015. She was also found to have three scalp lesions, biopsied in July 2015 confirming metastatic breast cancer. HER2 negative. She was started on anastrozole in August 2015.   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     Anastrozole was discontinued and she was switched to letrozole in April 2018.  Palbociclib was added in January 2018 after PET imaging revealed 2 new foci at T8 and the manubrium.   Zoledronic acid was discontinued in January 2019.  Letrozole was discontinued  in February 2019 due to progression. She was started on fulvestrant in February 2019. She received palliative radiation to the cervical spine in February 2019. Palbociclib was held during that time and resumed at 75 mg daily once completed. Denosumab was started in March 2019.    She has continued on palbociclib 75 mg daily for 3 weeks on and 1 week off, along with fulvestrant and denosumab every 4 weeks.  Most recent imaging was a PET scan in December 2023 revealed significant interval change in the diffuse osteoblastic metastatic lesions in the axial and proximal appendicular skeleton without abnormal FDG avidity compatible with chronic treated disease. No evidence of hypermetabolic local recurrent breast cancer or viable distant metastatic disease.    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 Patient states that she feels ok  but complains of severe right hip pain rating a 10/10. She has been cleared for hip replacement surgery which is now scheduled in March. 2025. She informed me that she is allergic to tramadol and oxycodone. I suggested hydrocodone and explained what this is and what it is for. I will order hydrocodone 5mg  every 4hrs but advised she limit this to 1-2 a day. She did start her [name] this week. She had a MRI of the right hip done on 03/02/2023 that revealed severe osteoarthritis of the right hip, large right hip joint effusion  with synovitis, and innumerable small sub-centimeter bone lesions throughout the visualized pelvis and proximal femur concerning for metastatic disease given the patient's history of breast cancer. She is currently on Faslodex/Denosumab injections and I advised she continue this to help protect her bones. We will plan to hold her Ibrance prior to surgery.  Her next injection is scheduled on 03/24/2023. Her PET scan was rescheduled for 04/01/2023. She had a low WBC of 3.3, hemoglobin of 12.0, platelet count of 220,000, and a normal CMP that was done on 02/24/2023. She will be due for repeat CBC, CMP, and CA 27.29 on 03/24/2023 and have this done every 4 weeks. I will see her back on 05/18/2022 with CBC, CMP, CA 27.29, and Faslodex/Xgeva injection.   She denies signs of infection such as sore throat, sinus drainage, cough, or urinary symptoms.  She denies fevers or recurrent chills. She denies nausea, vomiting, chest pain, dyspnea or cough. Her appetite is good and her weight has increased 2 pounds over last 2 months . She is accompanied by her husband at today's appointment.   REVIEW OF SYSTEMS:  Review of Systems  Constitutional:  Negative for appetite change, chills, fever and unexpected weight change.  HENT:  Negative.  Negative for lump/mass, mouth sores and sore throat.   Eyes: Negative.   Respiratory: Negative.  Negative for chest tightness, cough, hemoptysis, shortness of breath and wheezing.   Cardiovascular: Negative.  Negative for chest pain, leg swelling and palpitations.  Gastrointestinal: Negative.  Negative for abdominal distention, abdominal pain, blood in stool, constipation, diarrhea, nausea and vomiting.  Endocrine: Negative.   Genitourinary: Negative.  Negative for difficulty urinating, dysuria, frequency and hematuria.   Musculoskeletal:  Positive for arthralgias, back pain and myalgias. Negative for flank pain and gait problem.       Pain in her bones, right hip (10/10), and  knees  (chronic)    Skin: Negative.   Neurological:  Negative for dizziness, extremity weakness, gait problem, headaches, light-headedness, numbness, seizures and speech difficulty.  Hematological: Negative.  Negative for adenopathy. Does not bruise/bleed easily.  Psychiatric/Behavioral: Negative.  Negative for depression and sleep disturbance. The patient is not nervous/anxious.     VITALS:  Blood pressure (!) 170/86, pulse 72, temperature 97.7 F (36.5 C), temperature source Oral, resp. rate 18, height 5\' 2"  (1.575 m), weight 183 lb 12.8 oz (83.4 kg), SpO2 99%.  Wt Readings from Last 3 Encounters:  03/12/23 183 lb 12.8 oz (83.4 kg)  01/22/23 179 lb 6 oz (81.4 kg)  12/25/22 181 lb 0.6 oz (82.1 kg)    Body mass index is 33.62 kg/m.  Performance status (ECOG): 1 - Symptomatic but completely ambulatory  PHYSICAL EXAM:  Physical Exam Vitals and nursing note reviewed. Exam conducted with a chaperone present.  Constitutional:      General: She is not in acute distress.    Appearance: Normal appearance. She is normal weight. She is not ill-appearing,  toxic-appearing or diaphoretic.  HENT:     Head: Normocephalic and atraumatic.     Right Ear: Tympanic membrane, ear canal and external ear normal. There is no impacted cerumen.     Left Ear: Tympanic membrane, ear canal and external ear normal. There is no impacted cerumen.     Nose: Nose normal. No congestion or rhinorrhea.     Mouth/Throat:     Mouth: Mucous membranes are moist.     Pharynx: Oropharynx is clear. No oropharyngeal exudate or posterior oropharyngeal erythema.  Eyes:     General: No scleral icterus.       Right eye: No discharge.        Left eye: No discharge.     Extraocular Movements: Extraocular movements intact.     Conjunctiva/sclera: Conjunctivae normal.     Pupils: Pupils are equal, round, and reactive to light.  Neck:     Vascular: No carotid bruit.  Cardiovascular:     Rate and Rhythm: Normal rate and regular  rhythm.     Pulses: Normal pulses.     Heart sounds: No murmur heard.    No friction rub. No gallop.  Pulmonary:     Effort: Pulmonary effort is normal. No respiratory distress.     Breath sounds: No stridor. No wheezing, rhonchi or rales.  Chest:     Chest wall: No tenderness.  Abdominal:     General: Bowel sounds are normal. There is no distension.     Palpations: Abdomen is soft. There is no mass.     Tenderness: There is no abdominal tenderness. There is no right CVA tenderness, left CVA tenderness, guarding or rebound.     Hernia: No hernia is present.     Comments: Liver feels normal  Musculoskeletal:        General: No swelling, deformity or signs of injury.     Cervical back: Normal range of motion and neck supple. No rigidity or tenderness.     Right lower leg: No edema.     Left lower leg: No edema.     Comments: Excessive induration of the posterior tibial area and circumferential upper left calf  Lymphadenopathy:     Cervical: No cervical adenopathy.  Skin:    General: Skin is warm and dry.     Coloration: Skin is not jaundiced or pale.     Findings: No bruising, erythema, lesion or rash.  Neurological:     General: No focal deficit present.     Mental Status: She is alert and oriented to person, place, and time. Mental status is at baseline.     Cranial Nerves: No cranial nerve deficit.     Sensory: No sensory deficit.     Motor: No weakness.     Coordination: Coordination normal.     Gait: Gait normal.     Deep Tendon Reflexes: Reflexes normal.  Psychiatric:        Mood and Affect: Mood normal.        Behavior: Behavior normal.        Thought Content: Thought content normal.        Judgment: Judgment normal.    LABS:      Latest Ref Rng & Units 02/24/2023    9:53 AM 01/20/2023   11:37 AM 12/22/2022   10:10 AM  CBC  WBC 4.0 - 10.5 K/uL 3.3  4.3  2.9   Hemoglobin 12.0 - 15.0 g/dL 16.1  09.6  04.5   Hematocrit 36.0 -  46.0 % 34.8  36.1  34.6   Platelets  150 - 400 K/uL 220  233  190       Latest Ref Rng & Units 02/24/2023    9:53 AM 01/20/2023   11:37 AM 12/22/2022   10:10 AM  CMP  Glucose 70 - 99 mg/dL 027  86  253   BUN 8 - 23 mg/dL 15  13  18    Creatinine 0.44 - 1.00 mg/dL 6.64  4.03  4.74   Sodium 135 - 145 mmol/L 135  131  133   Potassium 3.5 - 5.1 mmol/L 4.1  4.1  4.0   Chloride 98 - 111 mmol/L 99  99  102   CO2 22 - 32 mmol/L 23  22  22    Calcium 8.9 - 10.3 mg/dL 9.8  9.0  8.5   Total Protein 6.5 - 8.1 g/dL 6.6  7.3  6.6   Total Bilirubin <1.2 mg/dL 0.4  0.6  0.6   Alkaline Phos 38 - 126 U/L 58  51  53   AST 15 - 41 U/L 23  22  22    ALT 0 - 44 U/L 14  18  17       No results found for: "CEA1", "CEA" / No results found for: "CEA1", "CEA" No results found for: "PSA1" No results found for: "QVZ563" No results found for: "CAN125"  No results found for: "TOTALPROTELP", "ALBUMINELP", "A1GS", "A2GS", "BETS", "BETA2SER", "GAMS", "MSPIKE", "SPEI" No results found for: "TIBC", "FERRITIN", "IRONPCTSAT" Lab Results  Component Value Date   LDH 138 01/17/2011   LDH 155 12/28/2009   LDH 155 12/29/2008    STUDIES:  No results found.    HISTORY:   Past Medical History:  Diagnosis Date   Anxiety    Cancer [facility])    Breast 1997 right tx with mastectomy and chemo, metastatic now   GERD (gastroesophageal reflux disease)    Hypercholesterolemia    Hypertension    Hypothyroidism    Osteoarthritis    oa   Personal history of chemotherapy    Personal history of radiation therapy    Secondary malignant neoplasm of bone and bone marrow (HCC)    TIA (transient ischemic attack) last 09/10/20   x 3 total, they seem to occur every 5 years    Past Surgical History:  Procedure Laterality Date   CATARACT EXTRACTION, BILATERAL     CERVICAL FUSION  2020   MASTECTOMY MODIFIED RADICAL Right 1997   porta cath insertion  1997   later removed   radiation tx  finished 02-25-16   x 20 tx   TONSILLECTOMY     TOTAL HIP ARTHROPLASTY Left  03/12/2016   Procedure: LEFT TOTAL HIP ARTHROPLASTY ANTERIOR APPROACH;  Surgeon: Ollen Gross, MD;  Location: [facility];  Service: Orthopedics;  Laterality: Left;   TOTAL KNEE ARTHROPLASTY Left    TOTAL KNEE ARTHROPLASTY Right 10/30/2021   Procedure: TOTAL KNEE ARTHROPLASTY;  Surgeon: Eugenia Mcalpine, MD;  Location: [facility];  Service: Orthopedics;  Laterality: Right;  adductor canal 120   TUBAL LIGATION      Family History  Problem Relation Age of Onset   Breast cancer Maternal Aunt     Social History:  reports that she has never smoked. She has never used smokeless tobacco. She reports current alcohol use. She reports that she does not use drugs.The patient is accompanied by her husband today.  Allergies:  Allergies  Allergen Reactions   Other Nausea And Vomiting and Other (  See Comments)   Oxycodone-Acetaminophen Other (See Comments)   Oxycodone-Aspirin Other (See Comments) and Nausea And Vomiting   Erythromycin Other (See Comments)   Oxycodone Other (See Comments)   Oxycodone Hcl Nausea And Vomiting   Percocet [Oxycodone-Acetaminophen] Nausea And Vomiting   Percodan [Oxycodone-Aspirin] Nausea And Vomiting   Tramadol Itching   Amoxicillin Hives    Has patient had a PCN reaction causing immediate rash, facial/tongue/throat swelling, SOB or lightheadedness with hypotension:unsure  Has patient had a PCN reaction causing severe rash involving mucus membranes or skin necrosis:No  Has patient had a PCN reaction that required hospitalization:No  Has patient had a PCN reaction occurring within the last 10 years: Yes  If all of the above answers are "NO", then may proceed with Cephalosporin use.  Has patient had a PCN reaction causing immediate rash, facial/tongue/throat swelling, SOB or lightheadedness with hypotension:unsure, Has patient had a PCN reaction causing severe rash involving mucus membranes or skin necrosis:No, Has patient had a PCN reaction that required hospitalization:No, Has  patient had a PCN reaction occurring within the last 10 years: Yes, If all of the above answers are "NO", then may proceed with Cephalosporin use.    Current Medications: Current Outpatient Medications  Medication Sig Dispense Refill   acetaminophen (TYLENOL) 500 MG tablet Take 1,000 mg by mouth every 6 (six) hours as needed for mild pain.     acidophilus (RISAQUAD) CAPS capsule Take 1 capsule by mouth daily.     ascorbic acid (VITAMIN C) 1000 MG tablet Take 1,000 mg by mouth daily.     aspirin 325 MG tablet Take 325 mg by mouth daily.     beta carotene 15 MG capsule Take 15 mg by mouth daily.     Biotin 5000 MCG TABS Take 5,000 mcg by mouth daily.     CALCIUM CITRATE-VITAMIN D PO Take 1 tablet by mouth in the morning, at noon, in the evening, and at bedtime. Calcium 400mg  + D 500IU three times day     clopidogrel (PLAVIX) 75 MG tablet Take 1 tablet (75 mg total) by mouth daily.     cyanocobalamin (,VITAMIN B-12,) 1000 MCG/ML injection Inject 1,000 mcg into the muscle every 30 (thirty) days.     Denosumab (XGEVA [place]) Inject 1 Dose into the skin every 30 (thirty) days.     famotidine (PEPCID) 40 MG tablet Take 40 mg by mouth daily.     glucosamine-chondroitin 500-400 MG tablet Take 1 tablet by mouth daily.     HYDROcodone-acetaminophen (NORCO) 5-325 MG tablet Take 1 tablet by mouth every 4 (four) hours as needed for moderate pain (pain score 4-6). 30 tablet 0   ketotifen (ZADITOR) 0.025 % ophthalmic solution Place 1 drop into both eyes daily as needed (allergies).     levothyroxine (SYNTHROID, LEVOTHROID) 88 MCG tablet Take 88 mcg by mouth daily before breakfast.     LORazepam (ATIVAN) 1 MG tablet Take 1 tablet (1 mg total) by mouth every 8 (eight) hours as needed for anxiety. 30 tablet 0   losartan (COZAAR) 25 MG tablet Take 1 tablet by mouth daily.     Lutein 20 MG CAPS Take 20 mg by mouth daily.     meclizine (ANTIVERT) 25 MG tablet Take 25 mg by mouth 3 (three) times daily as needed for  dizziness.     melatonin 5 MG TABS Take 5 mg by mouth at bedtime as needed.     metroNIDAZOLE (METROCREAM) 0.75 % cream Apply 1 Application topically daily.  Multiple Vitamins-Minerals (MULTIVITAMIN WITH MINERALS) tablet Take 1 tablet by mouth daily.     Omega-3 1000 MG CAPS Take 1,000 mg by mouth in the morning and at bedtime.     palbociclib (IBRANCE) 75 MG tablet Take 1 tablet (75 mg total) by mouth daily. Take as directed by MD for 21 days on, 7 days off, repeat every 28 days. 21 tablet 5   pantoprazole (PROTONIX) 40 MG tablet Take 40 mg by mouth daily. Before breakfast and before supper     Polyethyl Glycol-Propyl Glycol 0.4-0.3 % SOLN Place 1-2 drops into both eyes 2 (two) times daily.     rOPINIRole (REQUIP) 2 MG tablet Take 4 mg by mouth at bedtime.     rosuvastatin (CRESTOR) 20 MG tablet Take 1 tablet (20 mg total) by mouth daily.     vitamin E 180 MG (400 UNITS) capsule Take 400 Units by mouth daily.     zinc gluconate 50 MG tablet Take 50 mg by mouth daily.     No current facility-administered medications for this visit.    I,Jasmine M Lassiter,acting as a scribe for Dellia Beckwith, MD.,have documented all relevant documentation on the behalf of Dellia Beckwith, MD,as directed by  Dellia Beckwith, MD while in the presence of Dellia Beckwith, MD.

## 2023-03-11 NOTE — Assessment & Plan Note (Signed)
Patient has a complicated oncology history history of invasive ductal carcinoma of the right breast diagnosed in 1997.  She was treated with mastectomy and adjuvant chemotherapy.  She also participated in clinical trial using adjuvant high-dose chemotherapy followed by stem cell transplant.  She was treated with adjuvant tamoxifen for 5 years.  She developed biopsy-proven liver metastasis, estrogen and HER2 receptor positive, in November 2007.  She received trastuzumab for an unknown period of time.  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  She was started on zoledronic acid in July and anastrozole in August 2015.    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  Anastrozole was discontinued and she was switched to letrozole.  Palbociclib was added in January 2018 after PET imaging revealed 2 new foci at T8 and the manubrium.  The dose of palbociclib had to be decreased over time.  Zoledronic acid was discontinued in January 2019.  Letrozole was discontinued due to progression and was started on fulvestrant in February 2019 due to progression. She received palliative radiation to the cervical spine in February 2019. Palbociclib was held during radiation and resumed at 75 mg daily once completed. Denosumab was started in March 2019.    She has remained on this regimen without evidence of progression.  Most recent imaging was a PET in December 2023 which did not reveal any evidence of progression.  Her white blood count is 2.5 with an ANC of 1.2 which is fairly stable.  She is nearing the end of the cycle of palbociclib and will have her week off.  She will continue fulvestrant and denosumab every 4 to 5 weeks based on her schedule with labs.  We will plan to see her back in December with a repeat PET.

## 2023-03-12 ENCOUNTER — Inpatient Hospital Stay (HOSPITAL_BASED_OUTPATIENT_CLINIC_OR_DEPARTMENT_OTHER): Payer: Medicare Other | Admitting: Oncology

## 2023-03-12 ENCOUNTER — Other Ambulatory Visit: Payer: Self-pay | Admitting: Oncology

## 2023-03-12 VITALS — BP 170/86 | HR 72 | Temp 97.7°F | Resp 18 | Ht 62.0 in | Wt 183.8 lb

## 2023-03-12 DIAGNOSIS — C7951 Secondary malignant neoplasm of bone: Secondary | ICD-10-CM

## 2023-03-12 DIAGNOSIS — C50919 Malignant neoplasm of unspecified site of unspecified female breast: Secondary | ICD-10-CM

## 2023-03-12 DIAGNOSIS — C50811 Malignant neoplasm of overlapping sites of right female breast: Secondary | ICD-10-CM

## 2023-03-12 DIAGNOSIS — C787 Secondary malignant neoplasm of liver and intrahepatic bile duct: Secondary | ICD-10-CM | POA: Diagnosis not present

## 2023-03-12 DIAGNOSIS — Z5111 Encounter for antineoplastic chemotherapy: Secondary | ICD-10-CM | POA: Diagnosis not present

## 2023-03-12 DIAGNOSIS — Z170 Estrogen receptor positive status [ER+]: Secondary | ICD-10-CM

## 2023-03-12 MED ORDER — HYDROCODONE-ACETAMINOPHEN 5-325 MG PO TABS: 1.0000 | ORAL_TABLET | ORAL | 0 refills | Status: DC | PRN

## 2023-03-16 ENCOUNTER — Encounter: Payer: Self-pay | Admitting: Oncology

## 2023-03-24 ENCOUNTER — Inpatient Hospital Stay: Payer: Medicare Other

## 2023-03-24 ENCOUNTER — Telehealth: Payer: Self-pay | Admitting: Dietician

## 2023-03-24 VITALS — BP 157/78 | HR 61 | Temp 98.0°F | Resp 18 | Ht 62.0 in

## 2023-03-24 DIAGNOSIS — Z5111 Encounter for antineoplastic chemotherapy: Secondary | ICD-10-CM | POA: Diagnosis not present

## 2023-03-24 DIAGNOSIS — C787 Secondary malignant neoplasm of liver and intrahepatic bile duct: Secondary | ICD-10-CM | POA: Diagnosis not present

## 2023-03-24 DIAGNOSIS — C50919 Malignant neoplasm of unspecified site of unspecified female breast: Secondary | ICD-10-CM | POA: Diagnosis not present

## 2023-03-24 DIAGNOSIS — C7951 Secondary malignant neoplasm of bone: Secondary | ICD-10-CM | POA: Diagnosis not present

## 2023-03-24 DIAGNOSIS — Z170 Estrogen receptor positive status [ER+]: Secondary | ICD-10-CM | POA: Diagnosis not present

## 2023-03-24 DIAGNOSIS — C50912 Malignant neoplasm of unspecified site of left female breast: Secondary | ICD-10-CM

## 2023-03-24 LAB — CMP (CANCER CENTER ONLY)
ALT: 13 U/L (ref 0–44)
AST: 21 U/L (ref 15–41)
Albumin: 4.6 g/dL (ref 3.5–5.0)
Alkaline Phosphatase: 65 U/L (ref 38–126)
Anion gap: 14 (ref 5–15)
BUN: 18 mg/dL (ref 8–23)
CO2: 22 mmol/L (ref 22–32)
Calcium: 9.7 mg/dL (ref 8.9–10.3)
Chloride: 95 mmol/L — ABNORMAL LOW (ref 98–111)
Creatinine: 0.95 mg/dL (ref 0.44–1.00)
GFR, Estimated: >60 mL/min (ref >60)
Glucose, Bld: 128 mg/dL — ABNORMAL HIGH (ref 70–99)
Potassium: 4.2 mmol/L (ref 3.5–5.1)
Sodium: 132 mmol/L — ABNORMAL LOW (ref 135–145)
Total Bilirubin: 0.4 mg/dL (ref 0.0–1.2)
Total Protein: 6.8 g/dL (ref 6.5–8.1)

## 2023-03-24 LAB — CBC WITH DIFFERENTIAL (CANCER CENTER ONLY)
Abs Immature Granulocytes: 0.02 10*3/uL (ref 0.00–0.07)
Basophils Absolute: 0 10*3/uL (ref 0.0–0.1)
Basophils Relative: 1 %
Eosinophils Absolute: 0.1 10*3/uL (ref 0.0–0.5)
Eosinophils Relative: 3 %
HCT: 35.8 % — ABNORMAL LOW (ref 36.0–46.0)
Hemoglobin: 12.1 g/dL (ref 12.0–15.0)
Immature Granulocytes: 1 %
Lymphocytes Relative: 28 %
Lymphs Abs: 0.9 10*3/uL (ref 0.7–4.0)
MCH: 30.1 pg (ref 26.0–34.0)
MCHC: 33.8 g/dL (ref 30.0–36.0)
MCV: 89.1 fL (ref 80.0–100.0)
Monocytes Absolute: 0.4 10*3/uL (ref 0.1–1.0)
Monocytes Relative: 13 %
Neutro Abs: 1.7 10*3/uL (ref 1.7–7.7)
Neutrophils Relative %: 54 %
Platelet Count: 244 10*3/uL (ref 150–400)
RBC: 4.02 MIL/uL (ref 3.87–5.11)
RDW: 15.2 % (ref 11.5–15.5)
WBC Count: 3.1 10*3/uL — ABNORMAL LOW (ref 4.0–10.5)
nRBC: 0 % (ref 0.0–0.2)
nRBC: 0 /100{WBCs}

## 2023-03-24 MED ORDER — DENOSUMAB 120 MG/1.7ML ~~LOC~~ SOLN
120.0000 mg | Freq: Once | SUBCUTANEOUS | Status: AC
  Administered 2023-03-24: 120 mg via SUBCUTANEOUS
  Filled 2023-03-24: qty 1.7

## 2023-03-24 MED ORDER — FULVESTRANT 250 MG/5ML IM SOSY
500.0000 mg | PREFILLED_SYRINGE | Freq: Once | INTRAMUSCULAR | Status: AC
  Administered 2023-03-24: 500 mg via INTRAMUSCULAR
  Filled 2023-03-24: qty 10

## 2023-03-24 NOTE — Patient Instructions (Signed)
Fulvestrant Injection What is this medication? FULVESTRANT (ful VES trant) treats breast cancer. It works by blocking the hormone estrogen in breast tissue, which prevents breast cancer cells from spreading or growing. This medicine may be used for other purposes; ask your health care provider or pharmacist if you have questions. COMMON BRAND NAME(S): FASLODEX What should I tell my care team before I take this medication? They need to know if you have any of these conditions: Bleeding disorder Liver disease Low blood cell levels, such as low white cells, red cells, and platelets An unusual or allergic reaction to fulvestrant, other medications, foods, dyes, or preservatives Pregnant or trying to get pregnant Breast-feeding How should I use this medication? This medication is injected into a muscle. It is given by your care team in a hospital or clinic setting. Talk to your care team about the use of this medication in children. Special care may be needed. Overdosage: If you think you have taken too much of this medicine contact a poison control center or emergency room at once. NOTE: This medicine is only for you. Do not share this medicine with others. What if I miss a dose? Keep appointments for follow-up doses. It is important not to miss your dose. Call your care team if you are unable to keep an appointment. What may interact with this medication? Certain medications that prevent or treat blood clots, such as warfarin, enoxaparin, dalteparin, apixaban, dabigatran, rivaroxaban This list may not describe all possible interactions. Give your health care provider a list of all the medicines, herbs, non-prescription drugs, or dietary supplements you use. Also tell them if you smoke, drink alcohol, or use illegal drugs. Some items may interact with your medicine. What should I watch for while using this medication? Your condition will be monitored carefully while you are receiving this  medication. You may need blood work while taking this medication. Talk to your care team if you may be pregnant. Serious birth defects can occur if you take this medication during pregnancy and for 1 year after the last dose. You will need a negative pregnancy test before starting this medication. Contraception is recommended while taking this medication and for 1 year after the last dose. Your care team can help you find the option that works for you. Do not breastfeed while taking this medication and for 1 year after the last dose. This medication may cause infertility. Talk to your care team if you are concerned about your fertility. What side effects may I notice from receiving this medication? Side effects that you should report to your care team as soon as possible: Allergic reactions or angioedema--skin rash, itching or hives, swelling of the face, eyes, lips, tongue, arms, or legs, trouble swallowing or breathing Pain, tingling, or numbness in the hands or feet Side effects that usually do not require medical attention (report to your care team if they continue or are bothersome): Bone, joint, or muscle pain Constipation Headache Hot flashes Nausea Pain, redness, or irritation at injection site Unusual weakness or fatigue This list may not describe all possible side effects. Call your doctor for medical advice about side effects. You may report side effects to FDA at 1-800-FDA-1088. Where should I keep my medication? This medication is given in a hospital or clinic. It will not be stored at home. NOTE: This sheet is a summary. It may not cover all possible information. If you have questions about this medicine, talk to your doctor, pharmacist, or health care provider.    2024 Elsevier/Gold Standard (2021-07-23 00:00:00) Denosumab Injection (Oncology) What is this medication? DENOSUMAB (den oh SUE mab) prevents weakened bones caused by cancer. It may also be used to treat noncancerous  bone tumors that cannot be removed by surgery. It can also be used to treat high calcium levels in the blood caused by cancer. It works by blocking a protein that causes bones to break down quickly. This slows down the release of calcium from bones, which lowers calcium levels in your blood. It also makes your bones stronger and less likely to break (fracture). This medicine may be used for other purposes; ask your health care provider or pharmacist if you have questions. COMMON BRAND NAME(S): XGEVA What should I tell my care team before I take this medication? They need to know if you have any of these conditions: Dental disease Having surgery or tooth extraction Infection Kidney disease Low levels of calcium or vitamin D in the blood Malnutrition On hemodialysis Skin conditions or sensitivity Thyroid or parathyroid disease An unusual reaction to denosumab, other medications, foods, dyes, or preservatives Pregnant or trying to get pregnant Breast-feeding How should I use this medication? This medication is for injection under the skin. It is given by your care team in a hospital or clinic setting. A special MedGuide will be given to you before each treatment. Be sure to read this information carefully each time. Talk to your care team about the use of this medication in children. While it may be prescribed for children as young as 13 years for selected conditions, precautions do apply. Overdosage: If you think you have taken too much of this medicine contact a poison control center or emergency room at once. NOTE: This medicine is only for you. Do not share this medicine with others. What if I miss a dose? Keep appointments for follow-up doses. It is important not to miss your dose. Call your care team if you are unable to keep an appointment. What may interact with this medication? Do not take this medication with any of the following: Other medications containing denosumab This  medication may also interact with the following: Medications that lower your chance of fighting infection Steroid medications, such as prednisone or cortisone This list may not describe all possible interactions. Give your health care provider a list of all the medicines, herbs, non-prescription drugs, or dietary supplements you use. Also tell them if you smoke, drink alcohol, or use illegal drugs. Some items may interact with your medicine. What should I watch for while using this medication? Your condition will be monitored carefully while you are receiving this medication. You may need blood work while taking this medication. This medication may increase your risk of getting an infection. Call your care team for advice if you get a fever, chills, sore throat, or other symptoms of a cold or flu. Do not treat yourself. Try to avoid being around people who are sick. You should make sure you get enough calcium and vitamin D while you are taking this medication, unless your care team tells you not to. Discuss the foods you eat and the vitamins you take with your care team. Some people who take this medication have severe bone, joint, or muscle pain. This medication may also increase your risk for jaw problems or a broken thigh bone. Tell your care team right away if you have severe pain in your jaw, bones, joints, or muscles. Tell your care team if you have any pain that does not go   away or that gets worse. Talk to your care team if you may be pregnant. Serious birth defects can occur if you take this medication during pregnancy and for 5 months after the last dose. You will need a negative pregnancy test before starting this medication. Contraception is recommended while taking this medication and for 5 months after the last dose. Your care team can help you find the option that works for you. What side effects may I notice from receiving this medication? Side effects that you should report to your care  team as soon as possible: Allergic reactions--skin rash, itching, hives, swelling of the face, lips, tongue, or throat Bone, joint, or muscle pain Low calcium level--muscle pain or cramps, confusion, tingling, or numbness in the hands or feet Osteonecrosis of the jaw--pain, swelling, or redness in the mouth, numbness of the jaw, poor healing after dental work, unusual discharge from the mouth, visible bones in the mouth Side effects that usually do not require medical attention (report to your care team if they continue or are bothersome): Cough Diarrhea Fatigue Headache Nausea This list may not describe all possible side effects. Call your doctor for medical advice about side effects. You may report side effects to FDA at 1-800-FDA-1088. Where should I keep my medication? This medication is given in a hospital or clinic. It will not be stored at home. NOTE: This sheet is a summary. It may not cover all possible information. If you have questions about this medicine, talk to your doctor, pharmacist, or health care provider.  2024 Elsevier/Gold Standard (2021-07-31 00:00:00)  

## 2023-03-24 NOTE — Telephone Encounter (Signed)
 Patient requested call. First attempt to reach. Provided my cell# on voice mail to return call to set up a nutrition consult.  Micheline Craven, RDN, LDN Registered Dietitian, [facility] Part Time Remote (Usual office hours: Tuesday-Thursday) Cell: [phone]

## 2023-03-25 LAB — CANCER ANTIGEN 27.29: CA 27.29: 38.9 U/mL — ABNORMAL HIGH (ref 0.0–38.6)

## 2023-03-27 NOTE — Patient Instructions (Signed)
 SURGICAL WAITING ROOM VISITATION  Patients having surgery or a procedure may have no more than 2 support people in the waiting area - these visitors may rotate.    Children under the age of 32 must have an adult with them who is not the patient.  Due to an increase in RSV and influenza rates and associated hospitalizations, children ages 46 and under may not visit patients in [facility] hospitals.  If the patient needs to stay at the hospital during part of their recovery, the visitor guidelines for inpatient rooms apply. Pre-op nurse will coordinate an appropriate time for 1 support person to accompany patient in pre-op.  This support person may not rotate.    Please refer to the [facility] website for the visitor guidelines for Inpatients (after your surgery is over and you are in a regular room).       Your procedure is scheduled on: 04/13/23   Report to [facility] Main Entrance    Report to admitting at  5:15 AM   Call this number if you have problems the morning of surgery [phone]   Do not eat food :After Midnight.   After Midnight you may have the following liquids until 4:15 am DAY OF SURGERY  Water  Non-Citrus Juices (without pulp, NO RED-Apple, White grape, White cranberry) Black Coffee (NO MILK/CREAM OR CREAMERS, sugar ok)  Clear Tea (NO MILK/CREAM OR CREAMERS, sugar ok) regular and decaf                             Plain Jell-O (NO RED)                                           Fruit ices (not with fruit pulp, NO RED)                                     Popsicles (NO RED)                                                                                  The day of surgery:  Drink ONE (1) Pre-Surgery Clear Ensure AT 4:15 AM the morning of surgery. Drink in one sitting. Do not sip.  This drink was given to you during your hospital  pre-op appointment visit. Nothing else to drink after completing the  Pre-Surgery Clear Ensure      Oral Hygiene is  also important to reduce your risk of infection.                                    Remember - BRUSH YOUR TEETH THE MORNING OF SURGERY WITH YOUR REGULAR TOOTHPASTE  DENTURES WILL BE REMOVED PRIOR TO SURGERY PLEASE DO NOT APPLY Poly grip OR ADHESIVES!!!   Stop all vitamins and herbal supplements 7 days before surgery.   Take these medicines the morning of  surgery with A SIP OF WATER : FAMOTIDINE , SYNTHROID , SANTOPRAZOLE, ROSUVASTATIN , ATIVAN  AND TYLENOL  IF NEEDED.             You may not have any metal on your body including hair pins, jewelry, and body piercing             Do not wear make-up, lotions, powders, perfumes/cologne, or deodorant  Do not wear nail polish including gel and S&S, artificial/acrylic nails, or any other type of covering on natural nails including finger and toenails. If you have artificial nails, gel coating, etc. that needs to be removed by a nail salon please have this removed prior to surgery or surgery may need to be canceled/ delayed if the surgeon/ anesthesia feels like they are unable to be safely monitored.   Do not shave  48 hours prior to surgery.    Do not bring valuables to the hospital. [facility] IS NOT             RESPONSIBLE   FOR VALUABLES.   Contacts, glasses, dentures or bridgework may not be worn into surgery.   Bring small overnight bag day of surgery.   DO NOT BRING YOUR HOME MEDICATIONS TO THE HOSPITAL. PHARMACY WILL DISPENSE MEDICATIONS LISTED ON YOUR MEDICATION LIST TO YOU DURING YOUR ADMISSION IN THE HOSPITAL!    Patients discharged on the day of surgery will not be allowed to drive home.  Someone NEEDS to stay with you for the first 24 hours after anesthesia.   Special Instructions: Bring a copy of your healthcare power of attorney and living will documents the day of surgery if you haven't scanned them before.              Please read over the following fact sheets you were given: IF YOU HAVE QUESTIONS ABOUT YOUR PRE-OP INSTRUCTIONS  PLEASE CALL [phone] Jade Mooney   If you received a COVID test during your pre-op visit  it is requested that you wear a mask when out in public, stay away from anyone that may not be feeling well and notify your surgeon if you develop symptoms. If you test positive for Covid or have been in contact with anyone that has tested positive in the last 10 days please notify you surgeon.      Pre-operative 5 CHG Bath Instructions   You can play a key role in reducing the risk of infection after surgery. Your skin needs to be as free of germs as possible. You can reduce the number of germs on your skin by washing with CHG (chlorhexidine  gluconate) soap before surgery. CHG is an antiseptic soap that kills germs and continues to kill germs even after washing.   DO NOT use if you have an allergy to chlorhexidine /CHG or antibacterial soaps. If your skin becomes reddened or irritated, stop using the CHG and notify one of our RNs at [phone].   Please shower with the CHG soap starting 4 days before surgery using the following schedule:     Please keep in mind the following:  DO NOT shave, including legs and underarms, starting the day of your first shower.   You may shave your face at any point before/day of surgery.  Place clean sheets on your bed the day you start using CHG soap. Use a clean washcloth (not used since being washed) for each shower. DO NOT sleep with pets once you start using the CHG.   CHG Shower Instructions:  If you choose to wash your hair  and private area, wash first with your normal shampoo/soap.  After you use shampoo/soap, rinse your hair and body thoroughly to remove shampoo/soap residue.  Turn the water  OFF and apply about 3 tablespoons (45 ml) of CHG soap to a CLEAN washcloth.  Apply CHG soap ONLY FROM YOUR NECK DOWN TO YOUR TOES (washing for 3-5 minutes)  DO NOT use CHG soap on face, private areas, open wounds, or sores.  Pay special attention to the area where your  surgery is being performed.  If you are having back surgery, having someone wash your back for you may be helpful. Wait 2 minutes after CHG soap is applied, then you may rinse off the CHG soap.  Pat dry with a clean towel  Put on clean clothes/pajamas   If you choose to wear lotion, please use ONLY the CHG-compatible lotions on the back of this paper.     Additional instructions for the day of surgery: DO NOT APPLY any lotions, deodorants, cologne, or perfumes.   Put on clean/comfortable clothes.  Brush your teeth.  Ask your nurse before applying any prescription medications to the skin.      CHG Compatible Lotions   Aveeno Moisturizing lotion  Cetaphil Moisturizing Cream  Cetaphil Moisturizing Lotion  Clairol Herbal Essence Moisturizing Lotion, Dry Skin  Clairol Herbal Essence Moisturizing Lotion, Extra Dry Skin  Clairol Herbal Essence Moisturizing Lotion, Normal Skin  Curel Age Defying Therapeutic Moisturizing Lotion with Alpha Hydroxy  Curel Extreme Care Body Lotion  Curel Soothing Hands Moisturizing Hand Lotion  Curel Therapeutic Moisturizing Cream, Fragrance-Free  Curel Therapeutic Moisturizing Lotion, Fragrance-Free  Curel Therapeutic Moisturizing Lotion, Original Formula  Eucerin Daily Replenishing Lotion  Eucerin Dry Skin Therapy Plus Alpha Hydroxy Crme  Eucerin Dry Skin Therapy Plus Alpha Hydroxy Lotion  Eucerin Original Crme  Eucerin Original Lotion  Eucerin Plus Crme Eucerin Plus Lotion  Eucerin TriLipid Replenishing Lotion  Keri Anti-Bacterial Hand Lotion  Keri Deep Conditioning Original Lotion Dry Skin Formula Softly Scented  Keri Deep Conditioning Original Lotion, Fragrance Free Sensitive Skin Formula  Keri Lotion Fast Absorbing Fragrance Free Sensitive Skin Formula  Keri Lotion Fast Absorbing Softly Scented Dry Skin Formula  Keri Original Lotion  Keri Skin Renewal Lotion Keri Silky Smooth Lotion  Keri Silky Smooth Sensitive Skin Lotion  Nivea Body  Creamy Conditioning Oil  Nivea Body Extra Enriched Lotion  Nivea Body Original Lotion  Nivea Body Sheer Moisturizing Lotion Nivea Crme  Nivea Skin Firming Lotion  NutraDerm 30 Skin Lotion  NutraDerm Skin Lotion  NutraDerm Therapeutic Skin Cream  NutraDerm Therapeutic Skin Lotion  ProShield Protective Hand Cream   Incentive Spirometer (Watch this video at home: [email or web address])  An incentive spirometer is a tool that can help keep your lungs clear and active. This tool measures how well you are filling your lungs with each breath. Taking long deep breaths may help reverse or decrease the chance of developing breathing (pulmonary) problems (especially infection) following: A long period of time when you are unable to move or be active. BEFORE THE PROCEDURE  If the spirometer includes an indicator to show your best effort, your nurse or respiratory therapist will set it to a desired goal. If possible, sit up straight or lean slightly forward. Try not to slouch. Hold the incentive spirometer in an upright position. INSTRUCTIONS FOR USE  Sit on the edge of your bed if possible, or sit up as far as you can in bed or on a chair. Hold the incentive spirometer in an  upright position. Breathe out normally. Place the mouthpiece in your mouth and seal your lips tightly around it. Breathe in slowly and as deeply as possible, raising the piston or the ball toward the top of the column. Hold your breath for 3-5 seconds or for as long as possible. Allow the piston or ball to fall to the bottom of the column. Remove the mouthpiece from your mouth and breathe out normally. Rest for a few seconds and repeat Steps 1 through 7 at least 10 times every 1-2 hours when you are awake. Take your time and take a few normal breaths between deep breaths. The spirometer may include an indicator to show your best effort. Use the indicator as a goal to work toward during each  repetition. After each set of 10 deep breaths, practice coughing to be sure your lungs are clear. If you have an incision (the cut made at the time of surgery), support your incision when coughing by placing a pillow or rolled up towels firmly against it. Once you are able to get out of bed, walk around indoors and cough well. You may stop using the incentive spirometer when instructed by your caregiver.  RISKS AND COMPLICATIONS Take your time so you do not get dizzy or light-headed. If you are in pain, you may need to take or ask for pain medication before doing incentive spirometry. It is harder to take a deep breath if you are having pain. AFTER USE Rest and breathe slowly and easily. It can be helpful to keep track of a log of your progress. Your caregiver can provide you with a simple table to help with this. If you are using the spirometer at home, follow these instructions: SEEK MEDICAL CARE IF:  You are having difficultly using the spirometer. You have trouble using the spirometer as often as instructed. Your pain medication is not giving enough relief while using the spirometer. You develop fever of 100.5 F (38.1 C) or higher. SEEK IMMEDIATE MEDICAL CARE IF:  You cough up bloody sputum that had not been present before. You develop fever of 102 F (38.9 C) or greater. You develop worsening pain at or near the incision site. MAKE SURE YOU:  Understand these instructions. Will watch your condition. Will get help right away if you are not doing well or get worse. Document Released: 07/21/2006 Document Revised: 06/02/2011 Document Reviewed: 09/21/2006 [facility] Patient Information 2014 [place], [place].

## 2023-03-27 NOTE — Progress Notes (Addendum)
 COVID Vaccine received:  []  No [x]  Yes Date of any COVID positive Test in last 90 days: no PCP - Lonni Rubens MD Cardiologist - none  Chest x-ray -  EKG -   Stress Test -  ECHO - 01/07/21 Epic Cardiac Cath -   Bowel Prep - [x]  No  []   Yes ______  Pacemaker / ICD device [x]  No []  Yes   Spinal Cord Stimulator:[x]  No []  Yes       History of Sleep Apnea? [x]  No []  Yes   CPAP used?- [x]  No []  Yes    Does the patient monitor blood sugar?          [x]  No []  Yes  []  N/A  Patient has: [x]  NO Hx DM   []  Pre-DM                 []  DM1  []   DM2 Does patient have a [employer] or Dexacom? []  No []  Yes   Fasting Blood Sugar Ranges-  Checks Blood Sugar _____ times a day  GLP1 agonist / usual dose - no GLP1 instructions:  SGLT-2 inhibitors / usual dose - no SGLT-2 instructions:   Blood Thinner / Instructions:Plavix  . Pt. Will call PCP to find out when to stop Aspirin  Instructions:325 mg ASA  as above  Comments:   Activity level: Patient is able  to climb a flight of stairs without difficulty; [x]  No CP  [x]  No SOB,___   Patient can  perform ADLs without assistance.   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  Patient verbalized understanding and agreement to the Pre-Surgical Instructions that were given to them at this PAT appointment. Patient was also educated of the need to review these PAT instructions again prior to his/her surgery.I reviewed the appropriate phone numbers to call if they have any and questions or concerns.

## 2023-03-30 ENCOUNTER — Telehealth: Payer: Self-pay

## 2023-03-30 NOTE — Telephone Encounter (Signed)
 Patient called to let us  know that her hip surgery is scheduled for Jan.20th 2025 instead of May 27, 2023. Patient would like to know what she needs to do about her lab/injection appointment on Apr 21, 2023. Also would like to know what she needs to do about her Ibrance . She is in the middle of her cycle now.

## 2023-03-31 ENCOUNTER — Other Ambulatory Visit: Payer: Self-pay

## 2023-03-31 ENCOUNTER — Encounter (HOSPITAL_COMMUNITY)
Admission: RE | Admit: 2023-03-31 | Discharge: 2023-03-31 | Disposition: A | Discharge status: Home / Self Care | Payer: Medicare Other | Source: Ambulatory Visit | Attending: Orthopedic Surgery | Admitting: Orthopedic Surgery

## 2023-03-31 ENCOUNTER — Encounter (HOSPITAL_COMMUNITY): Payer: Self-pay

## 2023-03-31 VITALS — BP 186/83 | HR 69 | Temp 98.0°F | Resp 16 | Ht 62.0 in | Wt 178.0 lb

## 2023-03-31 DIAGNOSIS — Z01812 Encounter for preprocedural laboratory examination: Secondary | ICD-10-CM | POA: Insufficient documentation

## 2023-03-31 DIAGNOSIS — I10 Essential (primary) hypertension: Secondary | ICD-10-CM | POA: Insufficient documentation

## 2023-03-31 DIAGNOSIS — Z01818 Encounter for other preprocedural examination: Secondary | ICD-10-CM

## 2023-03-31 LAB — SURGICAL PCR SCREEN
MRSA, PCR: NEGATIVE
Staphylococcus aureus: NEGATIVE

## 2023-03-31 LAB — BASIC METABOLIC PANEL
Anion gap: 13 (ref 5–15)
BUN: 16 mg/dL (ref 8–23)
CO2: 21 mmol/L — ABNORMAL LOW (ref 22–32)
Calcium: 9.3 mg/dL (ref 8.9–10.3)
Chloride: 99 mmol/L (ref 98–111)
Creatinine, Ser: 0.74 mg/dL (ref 0.44–1.00)
GFR, Estimated: >60 mL/min (ref >60)
Glucose, Bld: 97 mg/dL (ref 70–99)
Potassium: 4.2 mmol/L (ref 3.5–5.1)
Sodium: 133 mmol/L — ABNORMAL LOW (ref 135–145)

## 2023-03-31 LAB — CBC
HCT: 37.3 % (ref 36.0–46.0)
Hemoglobin: 12.6 g/dL (ref 12.0–15.0)
MCH: 31.3 pg (ref 26.0–34.0)
MCHC: 33.8 g/dL (ref 30.0–36.0)
MCV: 92.6 fL (ref 80.0–100.0)
Platelets: 229 10*3/uL (ref 150–400)
RBC: 4.03 MIL/uL (ref 3.87–5.11)
RDW: 14.7 % (ref 11.5–15.5)
WBC: 3.2 10*3/uL — ABNORMAL LOW (ref 4.0–10.5)
nRBC: 0 % (ref 0.0–0.2)

## 2023-04-01 DIAGNOSIS — Z170 Estrogen receptor positive status [ER+]: Secondary | ICD-10-CM | POA: Diagnosis not present

## 2023-04-01 DIAGNOSIS — C50811 Malignant neoplasm of overlapping sites of right female breast: Secondary | ICD-10-CM | POA: Diagnosis not present

## 2023-04-01 DIAGNOSIS — D519 Vitamin B12 deficiency anemia, unspecified: Secondary | ICD-10-CM | POA: Diagnosis not present

## 2023-04-03 ENCOUNTER — Encounter: Payer: Self-pay | Admitting: Oncology

## 2023-04-03 NOTE — Telephone Encounter (Signed)
 Called patient and notified her of the message and patient voiced her understanding.

## 2023-04-06 ENCOUNTER — Encounter: Payer: Self-pay | Admitting: Orthopedic Surgery

## 2023-04-08 NOTE — H&P (Signed)
 TOTAL HIP ADMISSION H&P  Patient is admitted for right total hip arthroplasty.  Subjective:  Chief Complaint: Right hip pain  HPI: Jade Mooney, 78 y.o. female, has a history of pain and functional disability in the right hip due to arthritis and patient has failed non-surgical conservative treatments for greater than 12 weeks to include corticosteriod injections and activity modification. Onset of symptoms was gradual, starting 1 years ago with rapidlly worsening course since that time. The patient noted no past surgery on the right hip. Patient currently rates pain in the right hip at 7 out of 10 with activity. Patient has worsening of pain with activity and weight bearing. Patient has evidence of periarticular osteophytes and joint space narrowing by imaging studies. This condition presents safety issues increasing the risk of falls. 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 She is on denosumab . There is no current active infection.  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) 10/12/2013   TIA (transient ischemic attack)     Past Medical History:  Diagnosis Date   Anxiety    Cancer [facility])    Breast 1997 right tx with mastectomy and chemo, metastatic now   GERD (gastroesophageal reflux disease)    Hypercholesterolemia    Hypertension    Hypothyroidism    Osteoarthritis    oa   Personal history of chemotherapy    Personal history of  radiation therapy    Secondary malignant neoplasm of bone and bone marrow (HCC)    TIA (transient ischemic attack) last 09/10/20   x 3 total, they seem to occur every 5 years    Past Surgical History:  Procedure Laterality Date   CATARACT EXTRACTION, BILATERAL     CERVICAL FUSION  2020   MASTECTOMY MODIFIED RADICAL Right 1997   porta cath insertion  1997   later removed   radiation tx  finished 02-25-16   x 20 tx   TONSILLECTOMY     TOTAL HIP ARTHROPLASTY Left 03/12/2016   Procedure: LEFT TOTAL HIP ARTHROPLASTY ANTERIOR APPROACH;  Surgeon: Liliane Rei, MD;  Location: [facility];  Service: Orthopedics;  Laterality: Left;   TOTAL KNEE ARTHROPLASTY Left    TOTAL KNEE ARTHROPLASTY Right 10/30/2021   Procedure: TOTAL KNEE ARTHROPLASTY;  Surgeon: Genevie Kerns, MD;  Location: [facility];  Service: Orthopedics;  Laterality: Right;  adductor canal 120   TUBAL LIGATION      Prior to Admission medications   Medication Sig Start Date End Date Taking? Authorizing Provider  acetaminophen  (TYLENOL ) 500 MG tablet Take 1,000 mg by mouth every 6 (six) hours as needed for mild pain.   Yes [provider]  acidophilus (RISAQUAD) CAPS capsule Take 1 capsule by mouth daily.   Yes [provider]  ascorbic acid (VITAMIN C) 1000 MG tablet Take 1,000 mg by mouth daily.   Yes [provider]  aspirin  325 MG tablet Take 325 mg by mouth daily.   Yes [provider]  beta carotene 15 MG capsule Take  15 mg by mouth daily.   Yes [provider]  Biotin 5000 MCG TABS Take 5,000 mcg by mouth daily.   Yes [provider]  CALCIUM  CITRATE-VITAMIN D PO Take 1 tablet by mouth in the morning, at noon, in the evening, and at bedtime. 07/11/22  Yes [name], MD  clopidogrel  (PLAVIX ) 75 MG tablet Take 1 tablet (75 mg total) by mouth daily. 05/04/17  Yes Magrinat, Rozella Cornfield, MD  cyanocobalamin  (,VITAMIN B-12,) 1000 MCG/ML injection Inject 1,000 mcg into the muscle  every 30 (thirty) days.   Yes [provider]  Denosumab  (XGEVA  [place]) Inject 1 Dose into the skin every 30 (thirty) days.   Yes [provider]  famotidine  (PEPCID ) 40 MG tablet Take 40 mg by mouth daily. 07/12/19  Yes Magrinat, Gustav C, MD  glucosamine-chondroitin 500-400 MG tablet Take 1 tablet by mouth daily.   Yes [provider]  ketotifen (ZADITOR) 0.025 % ophthalmic solution Place 1 drop into both eyes daily as needed (allergies).   Yes [provider]  levothyroxine  (SYNTHROID , LEVOTHROID) 88 MCG tablet Take 88 mcg by mouth daily before breakfast.   Yes [provider]  LORazepam  (ATIVAN ) 1 MG tablet Take 1 tablet (1 mg total) by mouth every 8 (eight) hours as needed for anxiety. 08/26/22  Yes [name], MD  losartan  (COZAAR ) 25 MG tablet Take 25 mg by mouth daily.   Yes [provider]  Lutein 20 MG CAPS Take 20 mg by mouth daily.   Yes [provider]  meclizine  (ANTIVERT ) 25 MG tablet Take 25 mg by mouth 3 (three) times daily as needed for dizziness.   Yes [provider]  melatonin 5 MG TABS Take 5 mg by mouth at bedtime as needed (sleep).   Yes [provider]  metroNIDAZOLE  (METROCREAM ) 0.75 % cream Apply 1 Application topically daily.   Yes [provider]  Multiple Vitamins-Minerals (MULTIVITAMIN WITH MINERALS) tablet Take 1 tablet by mouth daily.   Yes [provider]  Omega-3 1000 MG CAPS Take 1,000 mg by mouth in the morning and at bedtime.   Yes [provider]  palbociclib  (IBRANCE ) 75 MG tablet Take 1 tablet (75 mg total) by mouth daily. Take as directed by MD for 21 days on, 7 days off, repeat every 28 days. Patient taking differently: Take 75 mg by mouth every other day. Take as directed by MD for 21 days on, 7 days off, repeat every 28 days. 03/02/23  Yes [name], MD  pantoprazole  (PROTONIX ) 40 MG tablet Take 40 mg by mouth daily.   Yes [provider]  Polyethyl Glycol-Propyl Glycol 0.4-0.3 % SOLN Place 1-2 drops into both eyes 2 (two) times daily.   Yes [provider]  rOPINIRole  (REQUIP ) 2 MG tablet Take 4 mg by mouth at bedtime.   Yes [provider]  rosuvastatin  (CRESTOR ) 20 MG tablet Take 1 tablet (20 mg total) by mouth daily. 01/25/19  Yes Magrinat, Rozella Cornfield, MD  vitamin E 180 MG (400 UNITS) capsule Take 400 Units by mouth daily.   Yes [provider]  zinc gluconate 50 MG tablet Take 50 mg by mouth daily.   Yes [provider]  HYDROcodone -acetaminophen  (NORCO) 5-325 MG tablet Take 1 tablet by mouth every 4 (four) hours as needed for moderate pain (pain score 4-6). Patient not taking: Reported on 03/27/2023 03/12/23   [name], MD    Allergies  Allergen Reactions  Oxycodone-Acetaminophen  Other (See Comments)   Oxycodone-Aspirin  Other (See Comments) and Nausea And Vomiting   Erythromycin Other (See Comments)   Oxycodone Other (See Comments)   Oxycodone Hcl Nausea And Vomiting   Percocet [Oxycodone-Acetaminophen ] Nausea And Vomiting   Percodan [Oxycodone-Aspirin ] Nausea And Vomiting   Tramadol  Itching   Amoxicillin Hives    Has patient had a PCN reaction causing immediate rash, facial/tongue/throat swelling, SOB or lightheadedness with hypotension:unsure  Has patient had a PCN reaction causing severe rash involving mucus membranes or skin necrosis:No  Has patient had a PCN reaction that required hospitalization:No  Has patient had a PCN reaction occurring within the last 10 years: Yes  If all of the above answers are "NO", then may proceed with Cephalosporin use.  Has patient had a PCN reaction causing immediate rash, facial/tongue/throat swelling, SOB or lightheadedness with hypotension:unsure, Has patient had a PCN reaction causing severe rash involving mucus membranes or skin necrosis:No, Has patient had a PCN reaction that required hospitalization:No, Has  patient had a PCN reaction occurring within the last 10 years: Yes, If all of the above answers are "NO", then may proceed with Cephalosporin use.    Social History   Socioeconomic History   Marital status: Married    Spouse name: Not on file   Number of children: Not on file   Years of education: Not on file   Highest education level: Not on file  Occupational History   Not on file  Tobacco Use   Smoking status: Never   Smokeless tobacco: Never  Vaping Use   Vaping status: Never Used  Substance and Sexual Activity   Alcohol use: Yes    Alcohol/week: 1.0 standard drink of alcohol    Types: 1 Glasses of wine per week    Comment: 1 glass wine 4-5 x week   Drug use: No   Sexual activity: Not on file  Other Topics Concern   Not on file  Social History Narrative   Not on file   Social Drivers of Health   Financial Resource Strain: Not on file  Food Insecurity: Not on file  Transportation Needs: Not on file  Physical Activity: Not on file  Stress: Not on file  Social Connections: Not on file  Intimate Partner Violence: Not on file    Tobacco Use: Low Risk  (03/31/2023)   Patient History    Smoking Tobacco Use: Never    Smokeless Tobacco Use: Never    Passive Exposure: Not on file   Social History   Substance and Sexual Activity  Alcohol Use Yes   Alcohol/week: 1.0 standard drink of alcohol   Types: 1 Glasses of wine per week   Comment: 1 glass wine 4-5 x week    Family History  Problem Relation Age of Onset   Breast cancer Maternal Aunt     ROS   Objective:  Physical Exam: - Well-developed female, alert, oriented, no apparent distress.  - Left hip: Normal motion with no discomfort.  - Right hip: Flexion to approximately 110 degrees, internal rotation to approximately 10 degrees, external rotation to approximately 30 degrees, abduction to approximately 30 degrees. Discomfort on range of motion. Trochanteric tenderness present.   IMAGING:  - Radiographs  from August 2024 demonstrate moderate arthritic changes in the right hip with a small lucency around the intertrochanteric area but no definitive lesion.  Assessment/Plan:  End stage arthritis, right hip  The patient history, physical examination, clinical judgement of the provider and imaging studies are  consistent with end stage degenerative joint disease of the right hip and total hip arthroplasty is deemed medically necessary. The treatment options including medical management, injection therapy, arthroscopy and arthroplasty were discussed at length. The risks and benefits of total hip arthroplasty were presented and reviewed. The risks due to aseptic loosening, infection, stiffness, dislocation/subluxation, thromboembolic complications and other imponderables were discussed. The patient acknowledged the explanation, agreed to proceed with the plan and consent was signed. Patient is being admitted for inpatient treatment for surgery, pain control, PT, OT, prophylactic antibiotics, VTE prophylaxis, progressive ambulation and ADLs and discharge planning.The patient is planning to be discharged  home .   Patient's anticipated LOS is less than 2 midnights, meeting these requirements: - Younger than 11 - Lives within 1 hour of care - Has a competent adult at home to recover with post-op recover - NO history of  - Chronic pain requiring opiods  - Diabetes  - Coronary Artery Disease  - Heart failure  - Heart attack  - DVT/VTE  - Cardiac arrhythmia  - Respiratory Failure/COPD  - Renal failure  - Anemia  - Advanced Liver disease  Therapy Plans: HEP Disposition: Home with husband Planned DVT Prophylaxis: Plavix  + ASA 325mg  QD  DME Needed: None PCP: Tanis Fan, MD (clearance received) Oncologist: Aurther Blue, MD (clearance received) TXA: TOPICAL Allergies: amoxicillin (hives), erythromycin (nausea), oxycodone (nausea and vomiting), tramadol  (itching) Anesthesia Concerns:  None BMI: 32.7 Last HgbA1c: Not diabetic Pharmacy[facility] [place] Regimen: Hydromorphone , no tramadol   Other: -Hx metastatic breast cancer with bone lesions. Per oncologist, denosumab  monthly injection has been held, and last Ibrance  dose on 04/01/23. -Plavix  and aspirin  held for 5 days -Hx three TIAs - Patient was instructed on what medications to stop prior to surgery. - Follow-up visit in 2 weeks with Dr. France Ina - Begin physical therapy following surgery - Pre-operative lab work as pre-surgical testing - Prescriptions will be provided in hospital at time of discharge  Angelo Kennedy, PA-C Orthopedic Surgery [facility] [place]

## 2023-04-09 ENCOUNTER — Encounter: Payer: Self-pay | Admitting: Oncology

## 2023-04-12 NOTE — Anesthesia Preprocedure Evaluation (Signed)
Anesthesia Evaluation  Patient identified by MRN, date of birth, ID band Patient awake    Reviewed: Allergy & Precautions, NPO status , Patient's Chart, lab work & pertinent test results  History of Anesthesia Complications Negative for: history of anesthetic complications  Airway Mallampati: II  TM Distance: >3 FB Neck ROM: Full    Dental no notable dental hx.    Pulmonary neg pulmonary ROS   Pulmonary exam normal        Cardiovascular hypertension, Pt. on medications Normal cardiovascular exam     Neuro/Psych   Anxiety     S/P cervical fusion TIA (2022, on Plavix)   GI/Hepatic Neg liver ROS,GERD  Medicated,,  Endo/Other  Hypothyroidism    Renal/GU negative Renal ROS  negative genitourinary   Musculoskeletal  (+) Arthritis ,    Abdominal   Peds  Hematology negative hematology ROS (+)   Anesthesia Other Findings Metastatic breast ca  Reproductive/Obstetrics                             Anesthesia Physical Anesthesia Plan  ASA: 3  Anesthesia Plan: Spinal   Post-op Pain Management: Tylenol PO (pre-op)*   Induction:   PONV Risk Score and Plan: Treatment may vary due to age or medical condition, Ondansetron, Propofol infusion and Dexamethasone  Airway Management Planned: Natural Airway and Simple Face Mask  Additional Equipment: None  Intra-op Plan:   Post-operative Plan:   Informed Consent: I have reviewed the patients History and Physical, chart, labs and discussed the procedure including the risks, benefits and alternatives for the proposed anesthesia with the patient or authorized representative who has indicated his/her understanding and acceptance.       Plan Discussed with: CRNA  Anesthesia Plan Comments:        Anesthesia Quick Evaluation

## 2023-04-13 ENCOUNTER — Observation Stay (HOSPITAL_COMMUNITY): Payer: Medicare Other

## 2023-04-13 ENCOUNTER — Ambulatory Visit (HOSPITAL_COMMUNITY): Payer: Medicare Other

## 2023-04-13 ENCOUNTER — Ambulatory Visit (HOSPITAL_COMMUNITY): Payer: Self-pay

## 2023-04-13 ENCOUNTER — Other Ambulatory Visit: Payer: Self-pay

## 2023-04-13 ENCOUNTER — Encounter (HOSPITAL_COMMUNITY)
Admission: RE | Disposition: A | Discharge status: Home / Self Care | Payer: Self-pay | Source: Ambulatory Visit | Attending: Orthopedic Surgery

## 2023-04-13 ENCOUNTER — Ambulatory Visit (HOSPITAL_COMMUNITY): Payer: Medicare Other | Admitting: Physician Assistant

## 2023-04-13 ENCOUNTER — Observation Stay (HOSPITAL_COMMUNITY)
Admission: RE | Admit: 2023-04-13 | Discharge: 2023-04-14 | Disposition: A | Discharge status: Home / Self Care | Payer: Medicare Other | Source: Ambulatory Visit | Attending: Orthopedic Surgery | Admitting: Orthopedic Surgery

## 2023-04-13 ENCOUNTER — Encounter (HOSPITAL_COMMUNITY): Payer: Self-pay | Admitting: Orthopedic Surgery

## 2023-04-13 DIAGNOSIS — M1611 Unilateral primary osteoarthritis, right hip: Secondary | ICD-10-CM

## 2023-04-13 DIAGNOSIS — Z96642 Presence of left artificial hip joint: Secondary | ICD-10-CM | POA: Diagnosis not present

## 2023-04-13 DIAGNOSIS — Z01818 Encounter for other preprocedural examination: Secondary | ICD-10-CM

## 2023-04-13 DIAGNOSIS — Z471 Aftercare following joint replacement surgery: Secondary | ICD-10-CM | POA: Diagnosis not present

## 2023-04-13 DIAGNOSIS — Z8673 Personal history of transient ischemic attack (TIA), and cerebral infarction without residual deficits: Secondary | ICD-10-CM | POA: Insufficient documentation

## 2023-04-13 DIAGNOSIS — Z96653 Presence of artificial knee joint, bilateral: Secondary | ICD-10-CM | POA: Diagnosis not present

## 2023-04-13 DIAGNOSIS — Z79899 Other long term (current) drug therapy: Secondary | ICD-10-CM | POA: Insufficient documentation

## 2023-04-13 DIAGNOSIS — E039 Hypothyroidism, unspecified: Secondary | ICD-10-CM | POA: Diagnosis not present

## 2023-04-13 DIAGNOSIS — Z7982 Long term (current) use of aspirin: Secondary | ICD-10-CM | POA: Diagnosis not present

## 2023-04-13 DIAGNOSIS — Z96643 Presence of artificial hip joint, bilateral: Secondary | ICD-10-CM | POA: Diagnosis not present

## 2023-04-13 DIAGNOSIS — Z7902 Long term (current) use of antithrombotics/antiplatelets: Secondary | ICD-10-CM | POA: Insufficient documentation

## 2023-04-13 DIAGNOSIS — I10 Essential (primary) hypertension: Secondary | ICD-10-CM | POA: Diagnosis not present

## 2023-04-13 DIAGNOSIS — Z853 Personal history of malignant neoplasm of breast: Secondary | ICD-10-CM | POA: Insufficient documentation

## 2023-04-13 DIAGNOSIS — M169 Osteoarthritis of hip, unspecified: Principal | ICD-10-CM | POA: Diagnosis present

## 2023-04-13 HISTORY — PX: TOTAL HIP ARTHROPLASTY: SHX124

## 2023-04-13 LAB — TYPE AND SCREEN
ABO/RH(D): O POS
Antibody Screen: POSITIVE
Unit division: 0
Unit division: 0
Unit division: 0

## 2023-04-13 LAB — BPAM RBC
Blood Product Expiration Date: 202502092359
Blood Product Expiration Date: 202502092359
Blood Product Expiration Date: 202502102359
ISSUE DATE / TIME: 202501100730
Unit Type and Rh: 5100
Unit Type and Rh: 5100
Unit Type and Rh: 5100

## 2023-04-13 SURGERY — ARTHROPLASTY, HIP, TOTAL, ANTERIOR APPROACH
Anesthesia: Spinal | Site: Hip | Laterality: Right

## 2023-04-13 MED ORDER — DEXAMETHASONE SODIUM PHOSPHATE 10 MG/ML IJ SOLN
10.0000 mg | Freq: Once | INTRAMUSCULAR | Status: AC
  Administered 2023-04-14: 10 mg via INTRAVENOUS
  Filled 2023-04-13: qty 1

## 2023-04-13 MED ORDER — PHENYLEPHRINE HCL-NACL 20-0.9 MG/250ML-% IV SOLN
INTRAVENOUS | Status: AC
  Filled 2023-04-13: qty 250

## 2023-04-13 MED ORDER — CLOPIDOGREL BISULFATE 75 MG PO TABS
75.0000 mg | ORAL_TABLET | Freq: Every day | ORAL | Status: DC
Start: 2023-04-14 — End: 2023-04-14
  Administered 2023-04-14: 75 mg via ORAL
  Filled 2023-04-13: qty 1

## 2023-04-13 MED ORDER — MENTHOL 3 MG MT LOZG
1.0000 | LOZENGE | OROMUCOSAL | Status: DC | PRN
Start: 2023-04-13 — End: 2023-04-14

## 2023-04-13 MED ORDER — MAGNESIUM CITRATE PO SOLN: 1.0000 | Freq: Once | ORAL | Status: DC | PRN

## 2023-04-13 MED ORDER — SODIUM CHLORIDE 0.9 % IV SOLN: INTRAVENOUS | Status: DC

## 2023-04-13 MED ORDER — ROSUVASTATIN CALCIUM 20 MG PO TABS
20.0000 mg | ORAL_TABLET | Freq: Every day | ORAL | Status: DC
  Filled 2023-04-13: qty 1

## 2023-04-13 MED ORDER — ACETAMINOPHEN 325 MG PO TABS: 325.0000 mg | ORAL_TABLET | Freq: Four times a day (QID) | ORAL | Status: DC | PRN

## 2023-04-13 MED ORDER — ROPINIROLE HCL 1 MG PO TABS
4.0000 mg | ORAL_TABLET | Freq: Every day | ORAL | Status: DC
  Administered 2023-04-13: 4 mg via ORAL
  Filled 2023-04-13: qty 4

## 2023-04-13 MED ORDER — LOSARTAN POTASSIUM 25 MG PO TABS
25.0000 mg | ORAL_TABLET | Freq: Every day | ORAL | Status: DC
Start: 2023-04-14 — End: 2023-04-14
  Administered 2023-04-14: 25 mg via ORAL
  Filled 2023-04-13: qty 1

## 2023-04-13 MED ORDER — ASPIRIN 325 MG PO TABS
325.0000 mg | ORAL_TABLET | Freq: Every day | ORAL | Status: DC
Start: 2023-04-14 — End: 2023-04-14
  Administered 2023-04-14: 325 mg via ORAL
  Filled 2023-04-13: qty 1

## 2023-04-13 MED ORDER — BISACODYL 10 MG RE SUPP
10.0000 mg | Freq: Every day | RECTAL | Status: DC | PRN
Start: 2023-04-13 — End: 2023-04-14

## 2023-04-13 MED ORDER — CHLORHEXIDINE GLUCONATE 0.12 % MT SOLN
15.0000 mL | Freq: Once | OROMUCOSAL | Status: AC
  Administered 2023-04-13: 15 mL via OROMUCOSAL

## 2023-04-13 MED ORDER — POVIDONE-IODINE 10 % EX SWAB: 2.0000 | Freq: Once | CUTANEOUS | Status: DC

## 2023-04-13 MED ORDER — LACTATED RINGERS IV SOLN: INTRAVENOUS | Status: DC

## 2023-04-13 MED ORDER — EPHEDRINE SULFATE (PRESSORS) 50 MG/ML IJ SOLN
INTRAMUSCULAR | Status: DC | PRN
  Administered 2023-04-13: 10 mg via INTRAVENOUS

## 2023-04-13 MED ORDER — METHOCARBAMOL 1000 MG/10ML IJ SOLN: 500.0000 mg | Freq: Four times a day (QID) | INTRAMUSCULAR | Status: DC | PRN

## 2023-04-13 MED ORDER — WATER FOR IRRIGATION, STERILE IR SOLN
Status: DC | PRN
  Administered 2023-04-13: 1000 mL

## 2023-04-13 MED ORDER — FENTANYL CITRATE PF 50 MCG/ML IJ SOSY
25.0000 ug | PREFILLED_SYRINGE | INTRAMUSCULAR | Status: DC | PRN
Start: 2023-04-13 — End: 2023-04-13

## 2023-04-13 MED ORDER — METHOCARBAMOL 500 MG PO TABS
500.0000 mg | ORAL_TABLET | Freq: Four times a day (QID) | ORAL | Status: DC | PRN
  Administered 2023-04-13 – 2023-04-14 (×4): 500 mg via ORAL
  Filled 2023-04-13 (×4): qty 1

## 2023-04-13 MED ORDER — LORAZEPAM 1 MG PO TABS: 1.0000 mg | ORAL_TABLET | Freq: Three times a day (TID) | ORAL | Status: DC | PRN

## 2023-04-13 MED ORDER — LEVOTHYROXINE SODIUM 88 MCG PO TABS
88.0000 ug | ORAL_TABLET | Freq: Every day | ORAL | Status: DC
  Administered 2023-04-14: 88 ug via ORAL
  Filled 2023-04-13: qty 1

## 2023-04-13 MED ORDER — ONDANSETRON HCL 4 MG/2ML IJ SOLN
INTRAMUSCULAR | Status: AC
Start: 2023-04-13 — End: ?
  Filled 2023-04-13: qty 2

## 2023-04-13 MED ORDER — ONDANSETRON HCL 4 MG/2ML IJ SOLN: 4.0000 mg | Freq: Four times a day (QID) | INTRAMUSCULAR | Status: DC | PRN

## 2023-04-13 MED ORDER — BUPIVACAINE-EPINEPHRINE (PF) 0.25% -1:200000 IJ SOLN
INTRAMUSCULAR | Status: DC | PRN
  Administered 2023-04-13: 30 mL

## 2023-04-13 MED ORDER — MORPHINE SULFATE (PF) 2 MG/ML IV SOLN: 0.5000 mg | INTRAVENOUS | Status: DC | PRN

## 2023-04-13 MED ORDER — LACTATED RINGERS IV SOLN: INTRAVENOUS | Status: DC | PRN

## 2023-04-13 MED ORDER — METOCLOPRAMIDE HCL 5 MG PO TABS: 5.0000 mg | ORAL_TABLET | Freq: Three times a day (TID) | ORAL | Status: DC | PRN

## 2023-04-13 MED ORDER — 0.9 % SODIUM CHLORIDE (POUR BTL) OPTIME
TOPICAL | Status: DC | PRN
  Administered 2023-04-13: 1000 mL

## 2023-04-13 MED ORDER — FENTANYL CITRATE (PF) 100 MCG/2ML IJ SOLN
INTRAMUSCULAR | Status: DC | PRN
  Administered 2023-04-13: 50 ug via INTRAVENOUS

## 2023-04-13 MED ORDER — BUPIVACAINE-EPINEPHRINE 0.25% -1:200000 IJ SOLN
INTRAMUSCULAR | Status: AC
  Filled 2023-04-13: qty 1

## 2023-04-13 MED ORDER — FENTANYL CITRATE (PF) 100 MCG/2ML IJ SOLN
INTRAMUSCULAR | Status: AC
  Filled 2023-04-13: qty 2

## 2023-04-13 MED ORDER — HYDROCODONE-ACETAMINOPHEN 5-325 MG PO TABS
1.0000 | ORAL_TABLET | ORAL | Status: DC | PRN
  Administered 2023-04-13 – 2023-04-14 (×4): 2 via ORAL
  Administered 2023-04-14: 1 via ORAL
  Filled 2023-04-13 (×5): qty 2

## 2023-04-13 MED ORDER — DEXAMETHASONE SODIUM PHOSPHATE 10 MG/ML IJ SOLN
INTRAMUSCULAR | Status: AC
  Filled 2023-04-13: qty 1

## 2023-04-13 MED ORDER — PANTOPRAZOLE SODIUM 40 MG PO TBEC
40.0000 mg | DELAYED_RELEASE_TABLET | Freq: Every day | ORAL | Status: DC
  Administered 2023-04-13 – 2023-04-14 (×2): 40 mg via ORAL
  Filled 2023-04-13 (×2): qty 1

## 2023-04-13 MED ORDER — METOCLOPRAMIDE HCL 5 MG/ML IJ SOLN: 5.0000 mg | Freq: Three times a day (TID) | INTRAMUSCULAR | Status: DC | PRN

## 2023-04-13 MED ORDER — TRANEXAMIC ACID 1000 MG/10ML IV SOLN
2000.0000 mg | Freq: Once | INTRAVENOUS | Status: DC
  Filled 2023-04-13: qty 20

## 2023-04-13 MED ORDER — ORAL CARE MOUTH RINSE: 15.0000 mL | Freq: Once | OROMUCOSAL | Status: AC

## 2023-04-13 MED ORDER — ALBUMIN HUMAN 5 % IV SOLN
INTRAVENOUS | Status: AC
  Filled 2023-04-13: qty 250

## 2023-04-13 MED ORDER — TRANEXAMIC ACID 1000 MG/10ML IV SOLN
INTRAVENOUS | Status: DC | PRN
  Administered 2023-04-13: 2000 mg via TOPICAL

## 2023-04-13 MED ORDER — DEXAMETHASONE SODIUM PHOSPHATE 10 MG/ML IJ SOLN
8.0000 mg | Freq: Once | INTRAMUSCULAR | Status: AC
  Administered 2023-04-13: 8 mg via INTRAVENOUS

## 2023-04-13 MED ORDER — ONDANSETRON HCL 4 MG/2ML IJ SOLN
INTRAMUSCULAR | Status: DC | PRN
  Administered 2023-04-13: 4 mg via INTRAVENOUS

## 2023-04-13 MED ORDER — ONDANSETRON HCL 4 MG PO TABS: 4.0000 mg | ORAL_TABLET | Freq: Four times a day (QID) | ORAL | Status: DC | PRN

## 2023-04-13 MED ORDER — LIDOCAINE HCL (PF) 2 % IJ SOLN
INTRAMUSCULAR | Status: AC
  Filled 2023-04-13: qty 5

## 2023-04-13 MED ORDER — BUPIVACAINE IN DEXTROSE 0.75-8.25 % IT SOLN
INTRATHECAL | Status: DC | PRN
  Administered 2023-04-13: 1.6 mL via INTRATHECAL

## 2023-04-13 MED ORDER — PHENOL 1.4 % MT LIQD: 1.0000 | OROMUCOSAL | Status: DC | PRN

## 2023-04-13 MED ORDER — PROPOFOL 500 MG/50ML IV EMUL
INTRAVENOUS | Status: DC | PRN
  Administered 2023-04-13 (×2): 20 mg via INTRAVENOUS
  Administered 2023-04-13: 100 ug/kg/min via INTRAVENOUS

## 2023-04-13 MED ORDER — ACETAMINOPHEN 10 MG/ML IV SOLN
1000.0000 mg | Freq: Four times a day (QID) | INTRAVENOUS | Status: DC
  Administered 2023-04-13: 1000 mg via INTRAVENOUS
  Filled 2023-04-13: qty 100

## 2023-04-13 MED ORDER — PHENYLEPHRINE HCL-NACL 20-0.9 MG/250ML-% IV SOLN
INTRAVENOUS | Status: DC | PRN
  Administered 2023-04-13: 25 ug/min via INTRAVENOUS

## 2023-04-13 MED ORDER — DOCUSATE SODIUM 100 MG PO CAPS
100.0000 mg | ORAL_CAPSULE | Freq: Two times a day (BID) | ORAL | Status: DC
  Administered 2023-04-13 – 2023-04-14 (×3): 100 mg via ORAL
  Filled 2023-04-13 (×3): qty 1

## 2023-04-13 MED ORDER — CEFAZOLIN SODIUM-DEXTROSE 2-4 GM/100ML-% IV SOLN
2.0000 g | INTRAVENOUS | Status: AC
  Administered 2023-04-13: 2 g via INTRAVENOUS
  Filled 2023-04-13: qty 100

## 2023-04-13 MED ORDER — FAMOTIDINE 20 MG PO TABS
40.0000 mg | ORAL_TABLET | Freq: Every day | ORAL | Status: DC
  Administered 2023-04-13: 40 mg via ORAL
  Filled 2023-04-13 (×2): qty 2

## 2023-04-13 MED ORDER — PROPOFOL 1000 MG/100ML IV EMUL
INTRAVENOUS | Status: AC
  Filled 2023-04-13: qty 100

## 2023-04-13 MED ORDER — CEFAZOLIN SODIUM-DEXTROSE 2-4 GM/100ML-% IV SOLN
2.0000 g | Freq: Four times a day (QID) | INTRAVENOUS | Status: AC
  Administered 2023-04-13 (×2): 2 g via INTRAVENOUS
  Filled 2023-04-13 (×2): qty 100

## 2023-04-13 MED ORDER — POLYETHYLENE GLYCOL 3350 17 G PO PACK: 17.0000 g | PACK | Freq: Every day | ORAL | Status: DC | PRN

## 2023-04-13 MED ORDER — HYDROCODONE-ACETAMINOPHEN 7.5-325 MG PO TABS
1.0000 | ORAL_TABLET | ORAL | Status: DC | PRN
Start: 2023-04-13 — End: 2023-04-14
  Administered 2023-04-14 (×2): 1 via ORAL
  Filled 2023-04-13 (×2): qty 1

## 2023-04-13 MED ORDER — PROPOFOL 1000 MG/100ML IV EMUL
INTRAVENOUS | Status: AC
  Filled 2023-04-13: qty 300

## 2023-04-13 SURGICAL SUPPLY — 37 items
BAG COUNTER SPONGE SURGICOUNT (BAG) IMPLANT
BAG ZIPLOCK 12X15 (MISCELLANEOUS) IMPLANT
BALL HIP ARTICU 28 +5 (Hips) IMPLANT
BLADE SAG 18X100X1.27 (BLADE) ×1 IMPLANT
COVER PERINEAL POST (MISCELLANEOUS) ×1 IMPLANT
COVER SURGICAL LIGHT HANDLE (MISCELLANEOUS) ×1 IMPLANT
CUP ACETBLR 48 OD SECTOR II (Hips) IMPLANT
DERMABOND ADVANCED .7 DNX12 (GAUZE/BANDAGES/DRESSINGS) ×1 IMPLANT
DRAPE FOOT SWITCH (DRAPES) ×1 IMPLANT
DRAPE STERI IOBAN 125X83 (DRAPES) ×1 IMPLANT
DRAPE U-SHAPE 47X51 STRL (DRAPES) ×2 IMPLANT
DRSG AQUACEL AG ADV 3.5X10 (GAUZE/BANDAGES/DRESSINGS) ×1 IMPLANT
DURAPREP 26ML APPLICATOR (WOUND CARE) ×1 IMPLANT
ELECT REM PT RETURN 15FT ADLT (MISCELLANEOUS) ×1 IMPLANT
GLOVE BIO SURGEON STRL SZ 6.5 (GLOVE) IMPLANT
GLOVE BIO SURGEON STRL SZ7 (GLOVE) IMPLANT
GLOVE BIO SURGEON STRL SZ8 (GLOVE) ×1 IMPLANT
GLOVE BIOGEL PI IND STRL 7.0 (GLOVE) IMPLANT
GLOVE BIOGEL PI IND STRL 8 (GLOVE) ×1 IMPLANT
GOWN STRL REUS W/ TWL LRG LVL3 (GOWN DISPOSABLE) ×2 IMPLANT
HIP BALL ARTICU 28 +5 (Hips) ×1 IMPLANT
HOLDER FOLEY CATH W/STRAP (MISCELLANEOUS) ×1 IMPLANT
KIT TURNOVER KIT A (KITS) IMPLANT
LINER MARATHON 28 48 (Hips) IMPLANT
MANIFOLD NEPTUNE II (INSTRUMENTS) ×1 IMPLANT
PACK ANTERIOR HIP CUSTOM (KITS) ×1 IMPLANT
PENCIL SMOKE EVACUATOR COATED (MISCELLANEOUS) ×1 IMPLANT
SPIKE FLUID TRANSFER (MISCELLANEOUS) ×1 IMPLANT
STEM FEM ACTIS STD SZ2 (Stem) IMPLANT
SUT ETHIBOND NAB CT1 #1 30IN (SUTURE) ×1 IMPLANT
SUT MNCRL AB 4-0 PS2 18 (SUTURE) ×1 IMPLANT
SUT STRATAFIX 0 PDS 27 VIOLET (SUTURE) ×1
SUT VIC AB 2-0 CT1 TAPERPNT 27 (SUTURE) ×2 IMPLANT
SUTURE STRATFX 0 PDS 27 VIOLET (SUTURE) ×1 IMPLANT
TOWEL GREEN STERILE FF (TOWEL DISPOSABLE) ×1 IMPLANT
TRAY FOLEY MTR SLVR 16FR STAT (SET/KITS/TRAYS/PACK) ×1 IMPLANT
TUBE SUCTION HIGH CAP CLEAR NV (SUCTIONS) ×1 IMPLANT

## 2023-04-13 NOTE — Op Note (Signed)
OPERATIVE REPORT- TOTAL HIP ARTHROPLASTY   PREOPERATIVE DIAGNOSIS: Osteoarthritis of the Right hip.   POSTOPERATIVE DIAGNOSIS: Osteoarthritis of the Right  hip.   PROCEDURE: Right total hip arthroplasty, anterior approach.   SURGEON: Ollen Gross, MD   ASSISTANT: Arther Abbott, PA-C  ANESTHESIA:  Spinal  ESTIMATED BLOOD LOSS:-150 mL    DRAINS: None  COMPLICATIONS: None   CONDITION: PACU - hemodynamically stable.   BRIEF CLINICAL NOTE: Jade Mooney is a 78 y.o. female who has advanced end-  stage arthritis of their Right  hip with progressively worsening pain and  dysfunction.The patient has failed nonoperative management and presents for  total hip arthroplasty.   PROCEDURE IN DETAIL: After successful administration of spinal  anesthetic, the traction boots for the [facility] bed were placed on both  feet and the patient was placed onto the [facility] bed, boots placed into the leg  holders. The Right hip was then isolated from the perineum with plastic  drapes and prepped and draped in the usual sterile fashion. ASIS and  greater trochanter were marked and a oblique incision was made, starting  at about 1 cm lateral and 2 cm distal to the ASIS and coursing towards  the anterior cortex of the femur. The skin was cut with a 10 blade  through subcutaneous tissue to the level of the fascia overlying the  tensor fascia lata muscle. The fascia was then incised in line with the  incision at the junction of the anterior third and posterior 2/3rd. The  muscle was teased off the fascia and then the interval between the TFL  and the rectus was developed. The Hohmann retractor was then placed at  the top of the femoral neck over the capsule. The vessels overlying the  capsule were cauterized and the fat on top of the capsule was removed.  A Hohmann retractor was then placed anterior underneath the rectus  femoris to give exposure to the entire anterior capsule. A T-shaped   capsulotomy was performed. The edges were tagged and the femoral head  was identified.       Osteophytes are removed off the superior acetabulum.  The femoral neck was then cut in situ with an oscillating saw. Traction  was then applied to the left lower extremity utilizing the [facility]  traction. The femoral head was then removed. Retractors were placed  around the acetabulum and then circumferential removal of the labrum was  performed. Osteophytes were also removed. Reaming starts at 45 mm to  medialize and  Increased in 2 mm increments to 47 mm. We reamed in  approximately 40 degrees of abduction, 20 degrees anteversion. A 48 mm  pinnacle acetabular shell was then impacted in anatomic position under  fluoroscopic guidance with excellent purchase. We did not need to place  any additional dome screws. A 28 mm neutral + 4 marathon liner was then  placed into the acetabular shell.       The femoral lift was then placed along the lateral aspect of the femur  just distal to the vastus ridge. The leg was  externally rotated and capsule  was stripped off the inferior aspect of the femoral neck down to the  level of the lesser trochanter, this was done with electrocautery. The femur was lifted after this was performed. The  leg was then placed in an extended and adducted position essentially delivering the femur. We also removed the capsule superiorly and the piriformis from the piriformis fossa to  gain excellent exposure of the  proximal femur. Rongeur was used to remove some cancellous bone to get  into the lateral portion of the proximal femur for placement of the  initial starter reamer. The starter broaches was placed  the starter broach  and was shown to go down the center of the canal. Broaching  with the Actis system was then performed starting at size 0  coursing  Up to size 2. A size 2 had excellent torsional and rotational  and axial stability. The trial standard offset neck was then  placed  with a 28 + 1.5 trial head. The hip was then reduced. We confirmed that  the stem was in the canal both on AP and lateral x-rays. It also has excellent sizing. The hip was reduced with outstanding stability through full extension and full external rotation.. AP pelvis was taken and the leg lengths were measured and found to be equal. Hip was then dislocated again and the femoral head and neck removed. The  femoral broach was removed. Size 2 Actis stem with a standard offset  neck was then impacted into the femur following native anteversion. Has  excellent purchase in the canal. Excellent torsional and rotational and  axial stability. It is confirmed to be in the canal on AP and lateral  fluoroscopic views. The 28 + 1.5 metal head was placed and the hip  reduced with outstanding stability. Again AP pelvis was taken and it  confirmed that the leg lengths were equal. The wound was then copiously  irrigated with saline solution and the capsule reattached and repaired  with Ethibond suture. 30 ml of .25% Bupivicaine was  injected into the capsule and into the edge of the tensor fascia lata as well as subcutaneous tissue. The fascia overlying the tensor fascia lata was then closed with a running #1 V-Loc. Subcu was closed with interrupted 2-0 Vicryl and subcuticular running 4-0 Monocryl. Incision was cleaned  and dried. Steri-Strips and a bulky sterile dressing applied. The patient was awakened and transported to  recovery in stable condition.        Please note that a surgical assistant was a medical necessity for this procedure to perform it in a safe and expeditious manner. Assistant was necessary to provide appropriate retraction of vital neurovascular structures and to prevent femoral fracture and allow for anatomic placement of the prosthesis.  Ollen Gross, M.D.

## 2023-04-13 NOTE — Interval H&P Note (Signed)
History and Physical Interval Note:  04/13/2023 6:48 AM  Jade Mooney  has presented today for surgery, with the diagnosis of Right hip osteoarthritis.  The various methods of treatment have been discussed with the patient and family. After consideration of risks, benefits and other options for treatment, the patient has consented to  Procedure(s): TOTAL HIP ARTHROPLASTY ANTERIOR APPROACH (Right) as a surgical intervention.  The patient's history has been reviewed, patient examined, no change in status, stable for surgery.  I have reviewed the patient's chart and labs.  Questions were answered to the patient's satisfaction.     Homero Fellers Robb Sibal

## 2023-04-13 NOTE — Anesthesia Procedure Notes (Signed)
Spinal  Patient location during procedure: OR Start time: 04/13/2023 7:22 AM End time: 04/13/2023 7:25 AM Reason for block: surgical anesthesia Staffing Performed: anesthesiologist  Anesthesiologist: Kaylyn Layer, MD Performed by: Kaylyn Layer, MD Authorized by: Kaylyn Layer, MD   Preanesthetic Checklist Completed: patient identified, IV checked, risks and benefits discussed, surgical consent, monitors and equipment checked, pre-op evaluation and timeout performed Spinal Block Patient position: sitting Prep: DuraPrep and site prepped and draped Patient monitoring: continuous pulse ox, blood pressure and heart rate Approach: midline Location: L3-4 Injection technique: single-shot Needle Needle type: Pencan  Needle gauge: 24 G Needle length: 9 cm Assessment Events: CSF return Additional Notes Risks, benefits, and alternative discussed. Patient gave consent to procedure. Prepped and draped in sitting position. Patient sedated but responsive to voice. Clear CSF obtained after one needle pass. Positive terminal aspiration. No pain or paraesthesias with injection. Patient tolerated procedure well. Vital signs stable. [name], MD

## 2023-04-13 NOTE — Evaluation (Signed)
Physical Therapy Evaluation Patient Details Name: Jade Mooney MRN: [id number] DOB: 04/01/1945 Today's Date: 04/13/2023  History of Present Illness  78 y.o. female admitted 04/13/23 for R AA-THA. PMH: L THA 2017, R TKA 2023, cervical fusion, breast cancer with mets to bone s/p mastectomy/chemo/radiation, OA, TIA x 3, anxiety.  Clinical Impression  Pt is s/p THA resulting in the deficits listed below (see PT Problem List). Pt ambulated 20' with RW, no loss of balance. Initiated THA HEP. Good progress expected.  Pt will benefit from acute skilled PT to increase their independence and safety with mobility to facilitate discharge.          If plan is discharge home, recommend the following: A little help with walking and/or transfers;A little help with bathing/dressing/bathroom;Assist for transportation;Help with stairs or ramp for entrance   Can travel by private vehicle        Equipment Recommendations Rolling walker (2 wheels)  Recommendations for Other Services       Functional Status Assessment Patient has had a recent decline in their functional status and demonstrates the ability to make significant improvements in function in a reasonable and predictable amount of time.     Precautions / Restrictions Precautions Precautions: Fall Restrictions Weight Bearing Restrictions Per Provider Order: No Other Position/Activity Restrictions: WBAT RLE      Mobility  Bed Mobility Overal bed mobility: Needs Assistance Bed Mobility: Supine to Sit     Supine to sit: Min assist, Used rails, HOB elevated     General bed mobility comments: min A to raise trunk and pivot hips to edge of bed    Transfers Overall transfer level: Needs assistance Equipment used: Rolling walker (2 wheels) Transfers: Sit to/from Stand Sit to Stand: Contact guard assist           General transfer comment: VCs hand placement    Ambulation/Gait Ambulation/Gait assistance: Contact guard  assist Gait Distance (Feet): 20 Feet Assistive device: Rolling walker (2 wheels) Gait Pattern/deviations: Step-to pattern, Decreased step length - right, Decreased step length - left Gait velocity: decr     General Gait Details: steady with RW, no loss of balance, VCs sequencing, distance limited by pain  Stairs            Wheelchair Mobility     Tilt Bed    Modified Rankin (Stroke Patients Only)       Balance Overall balance assessment: Needs assistance   Sitting balance-Leahy Scale: Good     Standing balance support: During functional activity, Bilateral upper extremity supported, Reliant on assistive device for balance Standing balance-Leahy Scale: Poor                               Pertinent Vitals/Pain Pain Assessment Pain Assessment: 0-10 Pain Score: 6  Pain Location: R hip with movement Pain Descriptors / Indicators: Sore Pain Intervention(s): Limited activity within patient's tolerance, Monitored during session, Premedicated before session, Ice applied    Home Living Family/patient expects to be discharged to:: Private residence Living Arrangements: Spouse/significant other Available Help at Discharge: Family;Available 24 hours/day Type of Home: House Home Access: Stairs to enter Entrance Stairs-Rails: Left Entrance Stairs-Number of Steps: 2 Alternate Level Stairs-Number of Steps: flight Home Layout: Two level;Able to live on main level with bedroom/bathroom Home Equipment: Rollator (4 wheels);Toilet riser;Grab bars - tub/shower;Grab bars - toilet;Shower seat Additional Comments: reports she has an old RW (more than 79 years old) that doesn't  fold up    Prior Function Prior Level of Function : Independent/Modified Independent             Mobility Comments: used rollator for long distances, no AD in home, no falls in past 6 months ADLs Comments: independent     Extremity/Trunk Assessment   Upper Extremity Assessment Upper  Extremity Assessment: Overall WFL for tasks assessed    Lower Extremity Assessment Lower Extremity Assessment: RLE deficits/detail RLE Deficits / Details: hip 2/5, hip flexion AAROM ~30* limited by pain RLE Sensation: WNL RLE Coordination: WNL    Cervical / Trunk Assessment Cervical / Trunk Assessment: Normal  Communication   Communication Communication: No apparent difficulties  Cognition Arousal: Alert Behavior During Therapy: WFL for tasks assessed/performed Overall Cognitive Status: Within Functional Limits for tasks assessed                                          General Comments      Exercises Total Joint Exercises Ankle Circles/Pumps: AROM, Both, 10 reps, Supine Heel Slides: AAROM, Right, 10 reps, Supine Hip ABduction/ADduction: AAROM, Right, 5 reps, Supine   Assessment/Plan    PT Assessment Patient needs continued PT services  PT Problem List Decreased strength;Decreased mobility;Decreased activity tolerance;Decreased balance;Pain       PT Treatment Interventions DME instruction;Functional mobility training;Stair training;Therapeutic exercise;Therapeutic activities;Patient/family education    PT Goals (Current goals can be found in the Care Plan section)  Acute Rehab PT Goals Patient Stated Goal: to go walking in the neighborhood PT Goal Formulation: With patient Time For Goal Achievement: 04/20/23 Potential to Achieve Goals: Good    Frequency 7X/week     Co-evaluation               AM-PAC PT "6 Clicks" Mobility  Outcome Measure Help needed turning from your back to your side while in a flat bed without using bedrails?: A Little Help needed moving from lying on your back to sitting on the side of a flat bed without using bedrails?: A Little Help needed moving to and from a bed to a chair (including a wheelchair)?: A Little Help needed standing up from a chair using your arms (e.g., wheelchair or bedside chair)?: A Little Help  needed to walk in hospital room?: A Little Help needed climbing 3-5 steps with a railing? : A Lot 6 Click Score: 17    End of Session Equipment Utilized During Treatment: Gait belt Activity Tolerance: Patient tolerated treatment well Patient left: in chair;with chair alarm set;with call bell/phone within reach Nurse Communication: Mobility status PT Visit Diagnosis: Pain Pain - Right/Left: Right Pain - part of body: Hip    Time: 6213-0865 PT Time Calculation (min) (ACUTE ONLY): 27 min   Charges:   PT Evaluation $PT Eval Moderate Complexity: 1 Mod PT Treatments $Gait Training: 8-22 mins PT General Charges $$ ACUTE PT VISIT: 1 Visit         Tamala Ser PT 04/13/2023  Acute Rehabilitation Services  Office [phone]

## 2023-04-13 NOTE — Anesthesia Postprocedure Evaluation (Signed)
Anesthesia Post Note  Patient: Jade Mooney  Procedure(s) Performed: TOTAL HIP ARTHROPLASTY ANTERIOR APPROACH (Right: Hip)     Patient location during evaluation: PACU Anesthesia Type: Spinal Level of consciousness: awake and alert Pain management: pain level controlled Vital Signs Assessment: post-procedure vital signs reviewed and stable Respiratory status: spontaneous breathing, nonlabored ventilation and respiratory function stable Cardiovascular status: blood pressure returned to baseline Postop Assessment: no apparent nausea or vomiting, spinal receding, no headache and no backache Anesthetic complications: no   No notable events documented.  Last Vitals:  Vitals:   04/13/23 0945 04/13/23 1000  BP: 131/66 130/83  Pulse: 64 69  Resp: 11 13  Temp:  36.6 C  SpO2: 98% 99%    Last Pain:  Vitals:   04/13/23 1000  TempSrc:   PainSc: 0-No pain                 Shanda Howells

## 2023-04-13 NOTE — Discharge Instructions (Signed)
°Jade Aluisio, MD °Total Joint Specialist °[facility] °[address] °[place], [place] [address] °[phone] ° °ANTERIOR APPROACH TOTAL HIP REPLACEMENT POSTOPERATIVE DIRECTIONS ° ° ° ° °Hip Rehabilitation, Guidelines Following Surgery  °The results of a hip operation are greatly improved after range of motion and muscle strengthening exercises. Follow all safety measures which are given to protect your hip. If any of these exercises cause increased pain or swelling in your joint, decrease the amount until you are comfortable again. Then slowly increase the exercises. Call your caregiver if you have problems or questions.  ° °HOME CARE INSTRUCTIONS  °Remove items at home which could result in a fall. This includes throw rugs or furniture in walking pathways.  °ICE to the affected hip as frequently as 20-30 minutes an hour and then as needed for pain and swelling. Continue to use ice on the hip for pain and swelling from surgery. You may notice swelling that will progress down to the foot and ankle. This is normal after surgery. Elevate the leg when you are not up walking on it.   °Continue to use the breathing machine which will help keep your temperature down.  It is common for your temperature to cycle up and down following surgery, especially at night when you are not up moving around and exerting yourself.  The breathing machine keeps your lungs expanded and your temperature down. ° °DIET °You may resume your previous home diet once your are discharged from the hospital. ° °DRESSING / WOUND CARE / SHOWERING °You have an adhesive waterproof bandage over the incision. Leave this in place until your first follow-up appointment. Once you remove this you will not need to place another bandage.  °You may begin showering 3 days following surgery, but do not submerge the incision under water. ° °ACTIVITY °For the first 3-5 days, it is important to rest and keep the operative leg elevated.  You should, as a general rule, rest for 50 minutes and walk/stretch for 10 minutes per hour. After 5 days, you may slowly increase activity as tolerated.  °Perform the exercises you were provided twice a day for about 15-20 minutes each session. Begin these 2 days following surgery. °Walk with your walker as instructed. Use the walker until you are comfortable transitioning to a cane. Walk with the cane in the opposite hand of the operative leg. You may discontinue the cane once you are comfortable and walking steadily. °Avoid periods of inactivity such as sitting longer than an hour when not asleep. This helps prevent blood clots.  °Do not drive a car for 6 weeks or until released by your surgeon.  °Do not drive while taking narcotics. ° °TED HOSE STOCKINGS °Wear the elastic stockings on both legs for three weeks following surgery during the day. You may remove them at night while sleeping. ° °WEIGHT BEARING °Weight bearing as tolerated with assist device (walker, cane, etc) as directed, use it as long as suggested by your surgeon or therapist, typically at least 4-6 weeks. ° °POSTOPERATIVE CONSTIPATION PROTOCOL °Constipation - defined medically as fewer than three stools per week and severe constipation as less than one stool per week. ° °One of the most common issues patients have following surgery is constipation.  Even if you have a regular bowel pattern at home, your normal regimen is likely to be disrupted due to multiple reasons following surgery.  Combination of anesthesia, postoperative narcotics, change in appetite and fluid intake all can affect your bowels.    In order to avoid complications following surgery, here are some recommendations in order to help you during your recovery period. ° °Colace (docusate) - Pick up an over-the-counter form of Colace or another stool softener and take twice a day as long as you are requiring postoperative pain medications.  Take with a full glass of water daily.  If  you experience loose stools or diarrhea, hold the colace until you stool forms back up.  If your symptoms do not get better within 1 week or if they get worse, check with your doctor. °Dulcolax (bisacodyl) - Pick up over-the-counter and take as directed by the product packaging as needed to assist with the movement of your bowels.  Take with a full glass of water.  Use this product as needed if not relieved by Colace only.  °MiraLax (polyethylene glycol) - Pick up over-the-counter to have on hand.  MiraLax is a solution that will increase the amount of water in your bowels to assist with bowel movements.  Take as directed and can mix with a glass of water, juice, soda, coffee, or tea.  Take if you go more than two days without a movement.Do not use MiraLax more than once per day. Call your doctor if you are still constipated or irregular after using this medication for 7 days in a row. ° °If you continue to have problems with postoperative constipation, please contact the office for further assistance and recommendations.  If you experience "the worst abdominal pain ever" or develop nausea or vomiting, please contact the office immediatly for further recommendations for treatment. ° °ITCHING ° If you experience itching with your medications, try taking only a single pain pill, or even half a pain pill at a time.  You can also use Benadryl over the counter for itching or also to help with sleep.  ° °MEDICATIONS °See your medication summary on the “After Visit Summary” that the nursing staff will review with you prior to discharge.  You may have some home medications which will be placed on hold until you complete the course of blood thinner medication.  It is important for you to complete the blood thinner medication as prescribed by your surgeon.  Continue your approved medications as instructed at time of discharge. ° °PRECAUTIONS °If you experience chest pain or shortness of breath - call 911 immediately for  transfer to the hospital emergency department.  °If you develop a fever greater that 101 F, purulent drainage from wound, increased redness or drainage from wound, foul odor from the wound/dressing, or calf pain - CONTACT YOUR SURGEON.   °                                                °FOLLOW-UP APPOINTMENTS °Make sure you keep all of your appointments after your operation with your surgeon and caregivers. You should call the office at the above phone number and make an appointment for approximately two weeks after the date of your surgery or on the date instructed by your surgeon outlined in the "After Visit Summary". ° °RANGE OF MOTION AND STRENGTHENING EXERCISES  °These exercises are designed to help you keep full movement of your hip joint. Follow your caregiver's or physical therapist's instructions. Perform all exercises about fifteen times, three times per day or as directed. Exercise both hips, even if you have had only   one joint replacement. These exercises can be done on a training (exercise) mat, on the floor, on a table or on a bed. Use whatever works the best and is most comfortable for you. Use music or television while you are exercising so that the exercises are a pleasant break in your day. This will make your life better with the exercises acting as a break in routine you can look forward to.  °Lying on your back, slowly slide your foot toward your buttocks, raising your knee up off the floor. Then slowly slide your foot back down until your leg is straight again.  °Lying on your back spread your legs as far apart as you can without causing discomfort.  °Lying on your side, raise your upper leg and foot straight up from the floor as far as is comfortable. Slowly lower the leg and repeat.  °Lying on your back, tighten up the muscle in the front of your thigh (quadriceps muscles). You can do this by keeping your leg straight and trying to raise your heel off the floor. This helps strengthen the  largest muscle supporting your knee.  °Lying on your back, tighten up the muscles of your buttocks both with the legs straight and with the knee bent at a comfortable angle while keeping your heel on the floor.  ° °POST-OPERATIVE OPIOID TAPER INSTRUCTIONS: °It is important to wean off of your opioid medication as soon as possible. If you do not need pain medication after your surgery it is ok to stop day one. °Opioids include: °Codeine, Hydrocodone(Norco, Vicodin), Oxycodone(Percocet, oxycontin) and hydromorphone amongst others.  °Long term and even short term use of opiods can cause: °Increased pain response °Dependence °Constipation °Depression °Respiratory depression °And more.  °Withdrawal symptoms can include °Flu like symptoms °Nausea, vomiting °And more °Techniques to manage these symptoms °Hydrate well °Eat regular healthy meals °Stay active °Use relaxation techniques(deep breathing, meditating, yoga) °Do Not substitute Alcohol to help with tapering °If you have been on opioids for less than two weeks and do not have pain than it is ok to stop all together.  °Plan to wean off of opioids °This plan should start within one week post op of your joint replacement. °Maintain the same interval or time between taking each dose and first decrease the dose.  °Cut the total daily intake of opioids by one tablet each day °Next start to increase the time between doses. °The last dose that should be eliminated is the evening dose.  ° °IF YOU ARE TRANSFERRED TO A SKILLED REHAB FACILITY °If the patient is transferred to a skilled rehab facility following release from the hospital, a list of the current medications will be sent to the facility for the patient to continue.  When discharged from the skilled rehab facility, please have the facility set up the patient's Home Health Physical Therapy prior to being released. Also, the skilled facility will be responsible for providing the patient with their medications at time of  release from the facility to include their pain medication, the muscle relaxants, and their blood thinner medication. If the patient is still at the rehab facility at time of the two week follow up appointment, the skilled rehab facility will also need to assist the patient in arranging follow up appointment in our office and any transportation needs. ° °MAKE SURE YOU:  °Understand these instructions.  °Get help right away if you are not doing well or get worse.  ° ° °DENTAL ANTIBIOTICS: ° °In most   cases prophylactic antibiotics for Dental procdeures after total joint surgery are not necessary. ° °Exceptions are as follows: ° °1. History of prior total joint infection ° °2. Severely immunocompromised (Organ Transplant, cancer chemotherapy, Rheumatoid biologic °meds such as Humera) ° °3. Poorly controlled diabetes (A1C &gt; 8.0, blood glucose over 200) ° °If you have one of these conditions, contact your surgeon for an antibiotic prescription, prior to your °dental procedure.  ° ° °Pick up stool softner and laxative for home use following surgery while on pain medications. °Do not submerge incision under water. °Please use good hand washing techniques while changing dressing each day. °May shower starting three days after surgery. °Please use a clean towel to pat the incision dry following showers. °Continue to use ice for pain and swelling after surgery. °Do not use any lotions or creams on the incision until instructed by your surgeon. ° °

## 2023-04-13 NOTE — Plan of Care (Addendum)
Patient transported from PACU, assumed care. AO X 4, VSS. Room air, lung sounds clear. Foley care provided per protocol. Patient states no pain post procedure. Family updated at bedside.   Problem: Education: Goal: Knowledge of General Education information will improve Description: Including pain rating scale, medication(s)/side effects and non-pharmacologic comfort measures Outcome: Progressing   Problem: Health Behavior/Discharge Planning: Goal: Ability to manage health-related needs will improve Outcome: Progressing   Problem: Clinical Measurements: Goal: Ability to maintain clinical measurements within normal limits will improve Outcome: Progressing Goal: Will remain free from infection Outcome: Progressing Goal: Diagnostic test results will improve Outcome: Progressing Goal: Respiratory complications will improve Outcome: Progressing Goal: Cardiovascular complication will be avoided Outcome: Progressing   Problem: Activity: Goal: Risk for activity intolerance will decrease Outcome: Progressing   Problem: Nutrition: Goal: Adequate nutrition will be maintained Outcome: Progressing   Problem: Coping: Goal: Level of anxiety will decrease Outcome: Progressing   Problem: Elimination: Goal: Will not experience complications related to bowel motility Outcome: Progressing Goal: Will not experience complications related to urinary retention Outcome: Progressing   Problem: Pain Managment: Goal: General experience of comfort will improve and/or be controlled Outcome: Progressing   Problem: Safety: Goal: Ability to remain free from injury will improve Outcome: Progressing   Problem: Skin Integrity: Goal: Risk for impaired skin integrity will decrease Outcome: Progressing   Problem: Education: Goal: Knowledge of the prescribed therapeutic regimen will improve Outcome: Progressing Goal: Understanding of discharge needs will improve Outcome: Progressing Goal:  Individualized Educational Video(s) Outcome: Progressing   Problem: Activity: Goal: Ability to avoid complications of mobility impairment will improve Outcome: Progressing Goal: Ability to tolerate increased activity will improve Outcome: Progressing   Problem: Clinical Measurements: Goal: Postoperative complications will be avoided or minimized Outcome: Progressing   Problem: Pain Management: Goal: Pain level will decrease with appropriate interventions Outcome: Progressing   Problem: Skin Integrity: Goal: Will show signs of wound healing Outcome: Progressing

## 2023-04-13 NOTE — Care Plan (Signed)
Ortho Bundle Case Management Note  Patient Details  Name: Jade Mooney MRN: [id number] Date of Birth: 04/04/1945  R THA on 04-13-23 DCP:  Home with husband DME:  No needs, has a RW PT:  HEP                   DME Arranged:  N/A DME Agency:  NA  HH Arranged:  NA HH Agency:  NA  Additional Comments: Please contact me with any questions of if this plan should need to change.  Despina Pole, CCM, [facility] [phone] 04/13/2023, 5:41 PM

## 2023-04-13 NOTE — Transfer of Care (Signed)
Immediate Anesthesia Transfer of Care Note  Patient: Jade Mooney  Procedure(s) Performed: TOTAL HIP ARTHROPLASTY ANTERIOR APPROACH (Right: Hip)  Patient Location: PACU  Anesthesia Type:General  Level of Consciousness: awake and alert   Airway & Oxygen Therapy: Patient Spontanous Breathing and Patient connected to nasal cannula oxygen  Post-op Assessment: Report given to RN and Post -op Vital signs reviewed and stable  Post vital signs: Reviewed and stable  Last Vitals:  Vitals Value Taken Time  BP 106/60 04/13/23 0855  Temp    Pulse 84 04/13/23 0857  Resp    SpO2 96 % 04/13/23 0857  Vitals shown include unfiled device data.  Last Pain:  Vitals:   04/13/23 0633  TempSrc:   PainSc: 2          Complications: No notable events documented.

## 2023-04-13 NOTE — TOC Transition Note (Signed)
Transition of Care [facility]) - Discharge Note   Patient Details  Name: Jade Mooney MRN: [id number] Date of Birth: July 01, 1945  [facility] CM/SW Contact:  Amada Jupiter, LCSW Phone Number: 04/13/2023, 2:53 PM   Clinical Narrative:     Met with pt who confirms she has needed DME in the home.  Plan for HEP.  No TOC needs.  Final next level of care: Home/Self Care Barriers to Discharge: No Barriers Identified   Patient Goals and CMS Choice Patient states their goals for this hospitalization and ongoing recovery are:: return home          Discharge Placement                       Discharge Plan and Services Additional resources added to the After Visit Summary for                  DME Arranged: N/A DME Agency: NA                  Social Drivers of Health (SDOH) Interventions SDOH Screenings   Food Insecurity: No Food Insecurity (04/13/2023)  Housing: Low Risk  (04/13/2023)  Transportation Needs: No Transportation Needs (04/13/2023)  Utilities: Not At Risk (04/13/2023)  Social Connections: Socially Integrated (04/13/2023)  Tobacco Use: Low Risk  (04/13/2023)     Readmission Risk Interventions     No data to display

## 2023-04-14 ENCOUNTER — Encounter (HOSPITAL_COMMUNITY): Payer: Self-pay | Admitting: Orthopedic Surgery

## 2023-04-14 ENCOUNTER — Other Ambulatory Visit (HOSPITAL_COMMUNITY): Payer: Self-pay

## 2023-04-14 DIAGNOSIS — E039 Hypothyroidism, unspecified: Secondary | ICD-10-CM | POA: Diagnosis not present

## 2023-04-14 DIAGNOSIS — Z853 Personal history of malignant neoplasm of breast: Secondary | ICD-10-CM | POA: Diagnosis not present

## 2023-04-14 DIAGNOSIS — Z96653 Presence of artificial knee joint, bilateral: Secondary | ICD-10-CM | POA: Diagnosis not present

## 2023-04-14 DIAGNOSIS — M1611 Unilateral primary osteoarthritis, right hip: Secondary | ICD-10-CM | POA: Diagnosis not present

## 2023-04-14 DIAGNOSIS — I10 Essential (primary) hypertension: Secondary | ICD-10-CM | POA: Diagnosis not present

## 2023-04-14 DIAGNOSIS — Z8673 Personal history of transient ischemic attack (TIA), and cerebral infarction without residual deficits: Secondary | ICD-10-CM | POA: Diagnosis not present

## 2023-04-14 LAB — BASIC METABOLIC PANEL
Anion gap: 7 (ref 5–15)
BUN: 12 mg/dL (ref 8–23)
CO2: 20 mmol/L — ABNORMAL LOW (ref 22–32)
Calcium: 7.6 mg/dL — ABNORMAL LOW (ref 8.9–10.3)
Chloride: 108 mmol/L (ref 98–111)
Creatinine, Ser: 0.47 mg/dL (ref 0.44–1.00)
GFR, Estimated: >60 mL/min (ref >60)
Glucose, Bld: 109 mg/dL — ABNORMAL HIGH (ref 70–99)
Potassium: 3.8 mmol/L (ref 3.5–5.1)
Sodium: 135 mmol/L (ref 135–145)

## 2023-04-14 LAB — CBC
HCT: 29.3 % — ABNORMAL LOW (ref 36.0–46.0)
Hemoglobin: 9.6 g/dL — ABNORMAL LOW (ref 12.0–15.0)
MCH: 30.9 pg (ref 26.0–34.0)
MCHC: 32.8 g/dL (ref 30.0–36.0)
MCV: 94.2 fL (ref 80.0–100.0)
Platelets: 180 10*3/uL (ref 150–400)
RBC: 3.11 MIL/uL — ABNORMAL LOW (ref 3.87–5.11)
RDW: 14.9 % (ref 11.5–15.5)
WBC: 4.6 10*3/uL (ref 4.0–10.5)
nRBC: 0 % (ref 0.0–0.2)

## 2023-04-14 MED ORDER — METHOCARBAMOL 500 MG PO TABS: 500.0000 mg | ORAL_TABLET | Freq: Four times a day (QID) | ORAL | 0 refills | Status: DC | PRN

## 2023-04-14 MED ORDER — ONDANSETRON HCL 4 MG PO TABS: 4.0000 mg | ORAL_TABLET | Freq: Four times a day (QID) | ORAL | 0 refills | Status: DC | PRN

## 2023-04-14 MED ORDER — HYDROCODONE-ACETAMINOPHEN 5-325 MG PO TABS: 1.0000 | ORAL_TABLET | Freq: Four times a day (QID) | ORAL | 0 refills | Status: DC | PRN

## 2023-04-14 NOTE — Progress Notes (Signed)
   Subjective: 1 Day Post-Op Procedure(s) (LRB): TOTAL HIP ARTHROPLASTY ANTERIOR APPROACH (Right) Patient reports pain as mild.   Patient seen in rounds by Dr. Lequita Halt. Patient is well, and has had no acute complaints or problems. Denies chest pain or SOB. No issues overnight. Foley catheter removed this AM. We will continue therapy today, ambulated 20' yesterday.   Objective: Vital signs in last 24 hours: Temp:  [97.4 F (36.3 C)-98.6 F (37 C)] 97.5 F (36.4 C) (01/21 9629) Pulse Rate:  [60-86] 60 (01/21 0638) Resp:  [11-20] 17 (01/21 5284) BP: (98-148)/(57-86) 124/63 (01/21 1324) SpO2:  [94 %-99 %] 98 % (01/21 4010)  Intake/Output from previous day:  Intake/Output Summary (Last 24 hours) at 04/14/2023 0836 Last data filed at 04/14/2023 0223 Gross per 24 hour  Intake 1655.11 ml  Output 2650 ml  Net -994.89 ml     Intake/Output this shift: No intake/output data recorded.  Labs: Recent Labs    04/14/23 0324  HGB 9.6*   Recent Labs    04/14/23 0324  WBC 4.6  RBC 3.11*  HCT 29.3*  PLT 180   Recent Labs    04/14/23 0324  NA 135  K 3.8  CL 108  CO2 20*  BUN 12  CREATININE 0.47  GLUCOSE 109*  CALCIUM 7.6*   No results for input(s): "LABPT", "INR" in the last 72 hours.  Exam: General - Patient is Alert and Oriented Extremity - Neurologically intact Neurovascular intact Sensation intact distally Dorsiflexion/Plantar flexion intact Dressing - dressing C/D/I Motor Function - intact, moving foot and toes well on exam.   Past Medical History:  Diagnosis Date   Anxiety    Cancer (HCC)    Breast 1997 right tx with mastectomy and chemo, metastatic now   GERD (gastroesophageal reflux disease)    Hypercholesterolemia    Hypertension    Hypothyroidism    Osteoarthritis    oa   Personal history of chemotherapy    Personal history of radiation therapy    Secondary malignant neoplasm of bone and bone marrow (HCC)    TIA (transient ischemic attack) last  09/10/20   x 3 total, they seem to occur every 5 years    Assessment/Plan: 1 Day Post-Op Procedure(s) (LRB): TOTAL HIP ARTHROPLASTY ANTERIOR APPROACH (Right) Principal Problem:   OA (osteoarthritis) of hip Active Problems:   Primary osteoarthritis of right hip  Estimated body mass index is 32.26 kg/m as calculated from the following:   Height as of this encounter: 5\' 2"  (1.575 m).   Weight as of this encounter: 80 kg. Advance diet Up with therapy D/C IV fluids  DVT Prophylaxis - Aspirin and Plavix Weight bearing as tolerated. Continue therapy.  Plan is to go Home after hospital stay. Plan for discharge with HEP later today if progresses with therapy and meeting goals. Follow-up in the office in 2 weeks.  The PDMP database was reviewed today prior to any opioid medications being prescribed to this patient.  Arther Abbott, PA-C Orthopedic Surgery [phone] 04/14/2023, 8:36 AM

## 2023-04-14 NOTE — Plan of Care (Signed)

## 2023-04-14 NOTE — Plan of Care (Signed)
  Problem: Activity: Goal: Risk for activity intolerance will decrease Outcome: Progressing   Problem: Safety: Goal: Ability to remain free from injury will improve Outcome: Progressing   Problem: Pain Management: Goal: Pain level will decrease with appropriate interventions Outcome: Progressing

## 2023-04-14 NOTE — TOC Progression Note (Signed)
Transition of Care [facility]) - Progression Note   Patient Details  Name: Jade Mooney MRN: [id number] Date of Birth: September 26, 1945  Transition of [facility]) CM/SW Contact  Ewing Schlein, LCSW Phone Number: 04/14/2023, 9:54 AM  Clinical Narrative: Patient reported she will need a new rolling walker as the one she received in 2017 no longer works well. Patient agreeable to [employer] delivering youth rolling walker to room. CSW made DME referral to [facility] with [employer]. [employer] delivered youth rolling walker to room. TOC signing off.  Barriers to Discharge: No Barriers Identified  Expected Discharge Plan and Services Expected Discharge Date: 04/14/23               DME Arranged: Dan Humphreys youth DME Agency: [employer] Date DME Agency Contacted: 04/14/23 Representative spoke with at DME Agency: Yvonna Alanis HH Arranged: NA HH Agency: NA  Social Determinants of Health (SDOH) Interventions SDOH Screenings   Food Insecurity: No Food Insecurity (04/13/2023)  Housing: Low Risk  (04/13/2023)  Transportation Needs: No Transportation Needs (04/13/2023)  Utilities: Not At Risk (04/13/2023)  Social Connections: Socially Integrated (04/13/2023)  Tobacco Use: Low Risk  (04/13/2023)   Readmission Risk Interventions     No data to display

## 2023-04-14 NOTE — Progress Notes (Signed)
Discharge package printed and instruction given to pt. Pt verbalizes understanding.

## 2023-04-14 NOTE — Care Management Obs Status (Signed)
MEDICARE OBSERVATION STATUS NOTIFICATION   Patient Details  Name: Jade Mooney MRN: [id number] Date of Birth: Sep 16, 1945   Medicare Observation Status Notification Given:  Yes    Ewing Schlein, LCSW 04/14/2023, 9:53 AM

## 2023-04-14 NOTE — Progress Notes (Signed)
Physical Therapy Treatment Patient Details Name: Jade Mooney MRN: [id number] DOB: 17-Nov-1945 Today's Date: 04/14/2023   History of Present Illness 79 y.o. female admitted 04/13/23 for R AA-THA. PMH: L THA 2017, R TKA 2023, cervical fusion, breast cancer with mets to bone s/p mastectomy/chemo/radiation, OA, TIA x 3, anxiety.    PT Comments  POD # 1 am session Pt AxO x 3 pleasant and knowledgeable fro prior Total Joint Replacements. TOC in room and ordered pt a new walker since last time was 2017.  Assisted OOB to amb in hallway, practice stairs, Then returned to room to perform some TE's following HEP handout.  Instructed on proper tech, freq as well as use of ICE.   Addressed all mobility questions, discussed appropriate activity, educated on use of ICE.  Pt ready for D/C to home.    If plan is discharge home, recommend the following: A little help with walking and/or transfers;A little help with bathing/dressing/bathroom;Assist for transportation;Help with stairs or ramp for entrance   Can travel by private vehicle        Equipment Recommendations  Rolling walker (2 wheels)    Recommendations for Other Services       Precautions / Restrictions Precautions Precautions: Fall Restrictions Weight Bearing Restrictions Per Provider Order: No     Mobility  Bed Mobility Overal bed mobility: Needs Assistance Bed Mobility: Supine to Sit     Supine to sit: Supervision, Contact guard     General bed mobility comments: self able using belt to assist LE    Transfers   Equipment used: Rolling walker (2 wheels) Transfers: Sit to/from Stand Sit to Stand: Supervision, Contact guard assist           General transfer comment: good safety cognition and use of hands to steady self.    Ambulation/Gait Ambulation/Gait assistance: Supervision, Contact guard assist Gait Distance (Feet): 65 Feet Assistive device: Rolling walker (2 wheels) Gait Pattern/deviations: Step-to  pattern, Decreased step length - right, Decreased step length - left Gait velocity: decr     General Gait Details: tolerated a functional distance with good safety cognition and min pain   Stairs Stairs: Yes Stairs assistance: Supervision, Contact guard assist Stair Management: Step to pattern, Forwards, Two rails Number of Stairs: 2 General stair comments: able to correctly proper sequencing.  Tolerated well.   Wheelchair Mobility     Tilt Bed    Modified Rankin (Stroke Patients Only)       Balance                                            Cognition Arousal: Alert Behavior During Therapy: WFL for tasks assessed/performed Overall Cognitive Status: Within Functional Limits for tasks assessed                                 General Comments: AxO x 3 pleasant and knowledgable from prior Total Joint Replacements.        Exercises   Total Hip Replacement TE's following HEP Handout 10 reps ankle pumps 05 reps knee presses 05 reps heel slides 05 reps SAQ's 05 reps ABD Instructed how to use a belt loop to assist  Followed by ICE    General Comments        Pertinent Vitals/Pain Pain Assessment Pain Assessment: 0-10 Pain  Score: 3  Pain Location: R hip with movement Pain Descriptors / Indicators: Tender, Operative site guarding Pain Intervention(s): Monitored during session, Premedicated before session, Repositioned, Ice applied    Home Living                          Prior Function            PT Goals (current goals can now be found in the care plan section) Progress towards PT goals: Progressing toward goals    Frequency    7X/week      PT Plan      Co-evaluation              AM-PAC PT "6 Clicks" Mobility   Outcome Measure  Help needed turning from your back to your side while in a flat bed without using bedrails?: None Help needed moving from lying on your back to sitting on the side of a  flat bed without using bedrails?: None Help needed moving to and from a bed to a chair (including a wheelchair)?: None Help needed standing up from a chair using your arms (e.g., wheelchair or bedside chair)?: None Help needed to walk in hospital room?: None Help needed climbing 3-5 steps with a railing? : A Little 6 Click Score: 23    End of Session Equipment Utilized During Treatment: Gait belt Activity Tolerance: Patient tolerated treatment well Patient left: in chair;with chair alarm set;with call bell/phone within reach Nurse Communication: Mobility status Pain - Right/Left: Right Pain - part of body: Hip     Time: 9629-5284 PT Time Calculation (min) (ACUTE ONLY): 35 min  Charges:    $Gait Training: 8-22 mins $Therapeutic Exercise: 8-22 mins PT General Charges $$ ACUTE PT VISIT: 1 Visit                     {Adin Lariccia  PTA Acute  Rehabilitation Services Office M-F          [phone]

## 2023-04-15 ENCOUNTER — Other Ambulatory Visit (HOSPITAL_COMMUNITY): Payer: Self-pay

## 2023-04-15 ENCOUNTER — Other Ambulatory Visit (HOSPITAL_COMMUNITY): Payer: Self-pay | Admitting: Pharmacy Technician

## 2023-04-15 NOTE — Progress Notes (Signed)
Specialty Pharmacy Refill Coordination Note  Jade Mooney is a 78 y.o. female contacted today regarding refills of specialty medication(s) Palbociclib [name]   Patient requested Delivery   Delivery date: 05/07/23   Verified address: Patient address [address]  [place]   Medication will be filled on 05/06/23.

## 2023-04-17 LAB — TYPE AND SCREEN
ABO/RH(D): O POS
Antibody Screen: POSITIVE
Unit division: 0
Unit division: 0

## 2023-04-17 LAB — BPAM RBC
Blood Product Expiration Date: 202502092359
Blood Product Expiration Date: 202502102359
Unit Type and Rh: 5100
Unit Type and Rh: 5100

## 2023-04-21 ENCOUNTER — Ambulatory Visit: Payer: Medicare Other

## 2023-04-21 ENCOUNTER — Other Ambulatory Visit: Payer: Medicare Other

## 2023-04-21 NOTE — Discharge Summary (Signed)
Patient ID: Buena Boehm MRN: [id number] DOB/AGE: 09-10-1945 78 y.o.  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 female who was admitted 04/13/2023 for operative treatment ofOA (osteoarthritis) of hip. Patient has severe unremitting pain that affects sleep, daily activities, and work/hobbies. After pre-op clearance the patient was taken to the operating room on 04/13/2023 and underwent  Procedure(s): TOTAL HIP ARTHROPLASTY ANTERIOR APPROACH.    Patient was given perioperative antibiotics:  Anti-infectives (From admission, onward)    Start     Dose/Rate Route Frequency Ordered Stop   04/13/23 1330  ceFAZolin (ANCEF) IVPB 2g/100 mL premix        2 g 200 mL/hr over 30 Minutes Intravenous Every 6 hours 04/13/23 1048 04/13/23 2007   04/13/23 0600  ceFAZolin (ANCEF) IVPB 2g/100 mL premix        2 g 200 mL/hr over 30 Minutes Intravenous On call to O.R. 04/13/23 0541 04/13/23 0737        Patient was given sequential compression devices, early ambulation, and chemoprophylaxis to prevent DVT.  Patient benefited maximally from hospital stay  and there were no complications.    Recent vital signs: No data found.   Recent laboratory studies: No results for input(s): "WBC", "HGB", "HCT", "PLT", "NA", "K", "CL", "CO2", "BUN", "CREATININE", "GLUCOSE", "INR", "CALCIUM" in the last 72 hours.  Invalid input(s): "PT", "2"   Discharge Medications:   Allergies as of 04/14/2023       Reactions   Erythromycin Nausea Only   Oxycodone Hcl Nausea And Vomiting   Tramadol Itching   Amoxicillin Hives   Has patient had a PCN reaction causing immediate rash, facial/tongue/throat swelling, SOB or lightheadedness with hypotension:unsure Has patient had a PCN reaction causing severe rash involving mucus membranes or skin necrosis:No Has patient had a PCN reaction that required hospitalization:No Has patient had a PCN reaction occurring within the last 10 years: Yes If all of the above answers are "NO", then may proceed with Cephalosporin use. Has patient had a PCN reaction causing immediate rash, facial/tongue/throat swelling, SOB or lightheadedness with hypotension:unsure, Has patient had a PCN reaction causing severe rash involving mucus membranes or skin necrosis:No, Has patient had a PCN reaction that required hospitalization:No, Has patient had a PCN reaction occurring within the last 10 years: Yes, If all of the above answers are "NO", then may proceed with Cephalosporin use.        Medication List     TAKE these medications    acetaminophen 500 MG tablet Commonly known as: TYLENOL Take 1,000 mg by mouth every 6 (six) hours as needed  for mild pain.   acidophilus Caps capsule Take 1 capsule by mouth daily.   ascorbic acid 1000 MG tablet Commonly known as: VITAMIN C Take 1,000 mg by mouth daily.   aspirin 325 MG tablet Take 325 mg by mouth daily.   beta carotene 15 MG capsule Take 15 mg by mouth daily.   Biotin 5000 MCG Tabs Take 5,000 mcg by mouth daily.   CALCIUM CITRATE-VITAMIN D PO Take 1 tablet by mouth in the  morning, at noon, in the evening, and at bedtime.   clopidogrel 75 MG tablet Commonly known as: PLAVIX Take 1 tablet (75 mg total) by mouth daily.   cyanocobalamin 1000 MCG/ML injection Commonly known as: VITAMIN B12 Inject 1,000 mcg into the muscle every 30 (thirty) days.   famotidine 40 MG tablet Commonly known as: PEPCID Take 40 mg by mouth daily.   glucosamine-chondroitin 500-400 MG tablet Take 1 tablet by mouth daily.   HYDROcodone-acetaminophen 5-325 MG tablet Commonly known as: NORCO/VICODIN Take 1-2 tablets by mouth every 6 (six) hours as needed for severe pain (pain score 7-10). What changed:  how much to take when to take this reasons to take this   Ibrance 75 MG tablet Generic drug: palbociclib Take 1 tablet (75 mg total) by mouth daily. Take as directed by MD for 21 days on, 7 days off, repeat every 28 days. What changed: when to take this   ketotifen 0.025 % ophthalmic solution Commonly known as: ZADITOR Place 1 drop into both eyes daily as needed (allergies).   levothyroxine 88 MCG tablet Commonly known as: SYNTHROID Take 88 mcg by mouth daily before breakfast.   LORazepam 1 MG tablet Commonly known as: ATIVAN Take 1 tablet (1 mg total) by mouth every 8 (eight) hours as needed for anxiety.   losartan 25 MG tablet Commonly known as: COZAAR Take 25 mg by mouth daily.   Lutein 20 MG Caps Take 20 mg by mouth daily.   meclizine 25 MG tablet Commonly known as: ANTIVERT Take 25 mg by mouth 3 (three) times daily as needed for dizziness.   melatonin 5 MG Tabs Take 5 mg by mouth at bedtime as needed (sleep).   methocarbamol 500 MG tablet Commonly known as: ROBAXIN Take 1 tablet (500 mg total) by mouth every 6 (six) hours as needed for muscle spasms.   metroNIDAZOLE 0.75 % cream Commonly known as: METROCREAM Apply 1 Application topically daily.   multivitamin with minerals tablet Take 1 tablet by mouth daily.   Omega-3 1000 MG Caps Take 1,000 mg  by mouth in the morning and at bedtime.   ondansetron 4 MG tablet Commonly known as: ZOFRAN Take 1 tablet (4 mg total) by mouth every 6 (six) hours as needed for nausea.   pantoprazole 40 MG tablet Commonly known as: PROTONIX Take 40 mg by mouth daily.   Polyethyl Glycol-Propyl Glycol 0.4-0.3 % Soln Place 1-2 drops into both eyes 2 (two) times daily.   rOPINIRole 2 MG tablet Commonly known as: REQUIP Take 4 mg by mouth at bedtime.   rosuvastatin 20 MG tablet Commonly known as: CRESTOR Take 1 tablet (20 mg total) by mouth daily.   vitamin E 180 MG (400 UNITS) capsule Take 400 Units by mouth daily.   XGEVA [place] Inject 1 Dose into the skin every 30 (thirty) days.   zinc gluconate 50 MG tablet Take 50 mg by mouth daily.               Discharge Care  Instructions  (From admission, onward)           Start     Ordered   04/14/23 0000  Weight bearing as tolerated        04/14/23 0838   04/14/23 0000  Change dressing       Comments: You have an adhesive waterproof bandage over the incision. Leave this in place until your first follow-up appointment. Once you remove this you will not need to place another bandage.   04/14/23 0838            Diagnostic Studies: DG Pelvis Portable Result Date: 04/13/2023 CLINICAL DATA:  Arthroplasty EXAM: PORTABLE PELVIS 1 VIEWS COMPARISON:  X-ray 03/12/2016.  Fluoroscopy earlier 04/13/2023 FINDINGS: Interval placement of a right hip arthroplasty with Press-Fit components. Expected alignment. Lateral soft tissue gas. Stable left hip hemiarthroplasty. No acute fracture or dislocation. Scattered bony sclerosis identified. Diffuse colonic stool. Please correlate history of breast cancer. Imaging was obtained to aid in treatment. IMPRESSION: Acute surgical changes of right hip hemiarthroplasty. Old left arthroplasty. Stable diffuse bony sclerosis consistent with known osseous metastatic disease. Electronically Signed   By: Karen Kays M.D.    On: 04/13/2023 14:12   DG HIP UNILAT WITH PELVIS 1V RIGHT Result Date: 04/13/2023 CLINICAL DATA:  Post hip arthroplasty [id number] Surgery, elective [id number] EXAM: DG HIP (WITH OR WITHOUT PELVIS) 1V RIGHT COMPARISON:  02/21/2021 FINDINGS: Interval right hip arthroplasty, components project in expected location. No fracture or dislocation. Left hip arthroplasty components partially visualized. IMPRESSION: Right hip arthroplasty, without apparent complication. Electronically Signed   By: Corlis Leak M.D.   On: 04/13/2023 12:24   DG C-Arm 1-60 Min-No Report Result Date: 04/13/2023 Fluoroscopy was utilized by the requesting physician.  No radiographic interpretation.   DG C-Arm 1-60 Min-No Report Result Date: 04/13/2023 Fluoroscopy was utilized by the requesting physician.  No radiographic interpretation.    Disposition: Discharge disposition: 01-Home or Self Care       Discharge Instructions     Call MD / Call 911   Complete by: As directed    If you experience chest pain or shortness of breath, CALL 911 and be transported to the hospital emergency room.  If you develope a fever above 101 F, pus (white drainage) or increased drainage or redness at the wound, or calf pain, call your surgeon's office.   Change dressing   Complete by: As directed    You have an adhesive waterproof bandage over the incision. Leave this in place until your first follow-up appointment. Once you remove this you will not need to place another bandage.   Constipation Prevention   Complete by: As directed    Drink plenty of fluids.  Prune juice may be helpful.  You may use a stool softener, such as Colace (over the counter) 100 mg twice a day.  Use MiraLax (over the counter) for constipation as needed.   Diet - low sodium heart healthy   Complete by: As directed    Do not sit on low chairs, stoools or toilet seats, as it may be difficult to get up from low surfaces   Complete by: As directed    Driving restrictions    Complete by: As directed    No driving for two weeks   Post-operative opioid taper instructions:   Complete by: As directed    POST-OPERATIVE OPIOID TAPER INSTRUCTIONS: It is important to wean off of your opioid medication as soon as possible. If you do not need  pain medication after your surgery it is ok to stop day one. Opioids include: Codeine, Hydrocodone(Norco, Vicodin), Oxycodone(Percocet, oxycontin) and hydromorphone amongst others.  Long term and even short term use of opiods can cause: Increased pain response Dependence Constipation Depression Respiratory depression And more.  Withdrawal symptoms can include Flu like symptoms Nausea, vomiting And more Techniques to manage these symptoms Hydrate well Eat regular healthy meals Stay active Use relaxation techniques(deep breathing, meditating, yoga) Do Not substitute Alcohol to help with tapering If you have been on opioids for less than two weeks and do not have pain than it is ok to stop all together.  Plan to wean off of opioids This plan should start within one week post op of your joint replacement. Maintain the same interval or time between taking each dose and first decrease the dose.  Cut the total daily intake of opioids by one tablet each day Next start to increase the time between doses. The last dose that should be eliminated is the evening dose.      TED hose   Complete by: As directed    Use stockings (TED hose) for three weeks on both leg(s).  You may remove them at night for sleeping.   Weight bearing as tolerated   Complete by: As directed         Follow-up Information     Ollen Gross, MD. Go on 04/28/2023.   Specialty: Orthopedic Surgery Why: You are scheduled for a follow up appointment on 04-28-23 at 2:00 pm. Contact information: [address] [place] [address] [phone]                  Signed: Arther Abbott 04/21/2023, 12:57 PM

## 2023-04-30 DIAGNOSIS — C50911 Malignant neoplasm of unspecified site of right female breast: Secondary | ICD-10-CM | POA: Diagnosis not present

## 2023-04-30 DIAGNOSIS — J40 Bronchitis, not specified as acute or chronic: Secondary | ICD-10-CM | POA: Diagnosis not present

## 2023-04-30 DIAGNOSIS — J329 Chronic sinusitis, unspecified: Secondary | ICD-10-CM | POA: Diagnosis not present

## 2023-04-30 DIAGNOSIS — I10 Essential (primary) hypertension: Secondary | ICD-10-CM | POA: Diagnosis not present

## 2023-05-06 ENCOUNTER — Other Ambulatory Visit: Payer: Self-pay

## 2023-05-06 ENCOUNTER — Other Ambulatory Visit (HOSPITAL_COMMUNITY): Payer: Self-pay

## 2023-05-06 ENCOUNTER — Encounter: Payer: Self-pay | Admitting: Oncology

## 2023-05-06 ENCOUNTER — Telehealth: Payer: Self-pay | Admitting: Pharmacy Technician

## 2023-05-06 NOTE — Telephone Encounter (Signed)
Oral Oncology Patient Advocate Encounter  Was successful in securing patient a $15,000 grant from [employer] to provide copayment coverage for Jade Mooney.  This will keep the out of pocket expense at $0.     [employer] ID: [id number]  I have spoken with the patient.   The billing information is as follows and has been shared with [facility].    RxBin: [id number] PCN: [id number] Member ID: [id number] Group ID: [id number] Dates of Eligibility: 04/06/23 through 04/04/24  Fund:  Breast  Jinger Neighbors, CPhT-Adv Oncology Pharmacy Patient Advocate [facility] Direct Number: [phone]  Fax: [phone]

## 2023-05-07 ENCOUNTER — Other Ambulatory Visit: Payer: Self-pay | Admitting: Oncology

## 2023-05-07 DIAGNOSIS — Z1231 Encounter for screening mammogram for malignant neoplasm of breast: Secondary | ICD-10-CM

## 2023-05-08 DIAGNOSIS — D519 Vitamin B12 deficiency anemia, unspecified: Secondary | ICD-10-CM | POA: Diagnosis not present

## 2023-05-19 ENCOUNTER — Encounter: Payer: Self-pay | Admitting: Oncology

## 2023-05-19 ENCOUNTER — Other Ambulatory Visit: Payer: Self-pay | Admitting: Oncology

## 2023-05-19 ENCOUNTER — Inpatient Hospital Stay: Payer: Medicare Other

## 2023-05-19 ENCOUNTER — Inpatient Hospital Stay: Payer: Medicare Other | Attending: Oncology | Admitting: Oncology

## 2023-05-19 VITALS — BP 180/86 | HR 71 | Temp 97.9°F | Resp 18 | Ht 62.0 in | Wt 180.0 lb

## 2023-05-19 VITALS — BP 175/86 | HR 61 | Resp 18

## 2023-05-19 DIAGNOSIS — C7951 Secondary malignant neoplasm of bone: Secondary | ICD-10-CM | POA: Insufficient documentation

## 2023-05-19 DIAGNOSIS — C50919 Malignant neoplasm of unspecified site of unspecified female breast: Secondary | ICD-10-CM | POA: Diagnosis not present

## 2023-05-19 DIAGNOSIS — Z5111 Encounter for antineoplastic chemotherapy: Secondary | ICD-10-CM | POA: Insufficient documentation

## 2023-05-19 DIAGNOSIS — D702 Other drug-induced agranulocytosis: Secondary | ICD-10-CM | POA: Diagnosis not present

## 2023-05-19 DIAGNOSIS — C50811 Malignant neoplasm of overlapping sites of right female breast: Secondary | ICD-10-CM | POA: Diagnosis not present

## 2023-05-19 DIAGNOSIS — C50912 Malignant neoplasm of unspecified site of left female breast: Secondary | ICD-10-CM

## 2023-05-19 DIAGNOSIS — Z170 Estrogen receptor positive status [ER+]: Secondary | ICD-10-CM

## 2023-05-19 LAB — CBC WITH DIFFERENTIAL (CANCER CENTER ONLY)
Abs Immature Granulocytes: 0.01 10*3/uL (ref 0.00–0.07)
Basophils Absolute: 0 10*3/uL (ref 0.0–0.1)
Basophils Relative: 1 %
Eosinophils Absolute: 0.1 10*3/uL (ref 0.0–0.5)
Eosinophils Relative: 2 %
HCT: 32 % — ABNORMAL LOW (ref 36.0–46.0)
Hemoglobin: 10.8 g/dL — ABNORMAL LOW (ref 12.0–15.0)
Immature Granulocytes: 0 %
Lymphocytes Relative: 32 %
Lymphs Abs: 1.1 10*3/uL (ref 0.7–4.0)
MCH: 30 pg (ref 26.0–34.0)
MCHC: 33.8 g/dL (ref 30.0–36.0)
MCV: 88.9 fL (ref 80.0–100.0)
Monocytes Absolute: 0.5 10*3/uL (ref 0.1–1.0)
Monocytes Relative: 14 %
Neutro Abs: 1.7 10*3/uL (ref 1.7–7.7)
Neutrophils Relative %: 51 %
Platelet Count: 186 10*3/uL (ref 150–400)
RBC: 3.6 MIL/uL — ABNORMAL LOW (ref 3.87–5.11)
RDW: 15.8 % — ABNORMAL HIGH (ref 11.5–15.5)
WBC Count: 3.3 10*3/uL — ABNORMAL LOW (ref 4.0–10.5)
nRBC: 0 % (ref 0.0–0.2)
nRBC: 0 /100{WBCs}

## 2023-05-19 LAB — CMP (CANCER CENTER ONLY)
ALT: 11 U/L (ref 0–44)
AST: 21 U/L (ref 15–41)
Albumin: 4.2 g/dL (ref 3.5–5.0)
Alkaline Phosphatase: 74 U/L (ref 38–126)
Anion gap: 11 (ref 5–15)
BUN: 15 mg/dL (ref 8–23)
CO2: 22 mmol/L (ref 22–32)
Calcium: 9.3 mg/dL (ref 8.9–10.3)
Chloride: 98 mmol/L (ref 98–111)
Creatinine: 0.82 mg/dL (ref 0.44–1.00)
GFR, Estimated: >60 mL/min (ref >60)
Glucose, Bld: 99 mg/dL (ref 70–99)
Potassium: 4.3 mmol/L (ref 3.5–5.1)
Sodium: 132 mmol/L — ABNORMAL LOW (ref 135–145)
Total Bilirubin: 0.3 mg/dL (ref 0.0–1.2)
Total Protein: 6.8 g/dL (ref 6.5–8.1)

## 2023-05-19 MED ORDER — FULVESTRANT 250 MG/5ML IM SOSY
500.0000 mg | PREFILLED_SYRINGE | Freq: Once | INTRAMUSCULAR | Status: AC
  Administered 2023-05-19: 500 mg via INTRAMUSCULAR
  Filled 2023-05-19: qty 10

## 2023-05-19 MED ORDER — DENOSUMAB 120 MG/1.7ML ~~LOC~~ SOLN
120.0000 mg | Freq: Once | SUBCUTANEOUS | Status: AC
  Administered 2023-05-19: 120 mg via SUBCUTANEOUS
  Filled 2023-05-19: qty 1.7

## 2023-05-19 NOTE — Patient Instructions (Signed)
 Fulvestrant Injection What is this medication? FULVESTRANT (ful VES trant) treats breast cancer. It works by blocking the hormone estrogen in breast tissue, which prevents breast cancer cells from spreading or growing. This medicine may be used for other purposes; ask your health care provider or pharmacist if you have questions. COMMON BRAND NAME(S): FASLODEX What should I tell my care team before I take this medication? They need to know if you have any of these conditions: Bleeding disorder Liver disease Low blood cell levels (white cells, red cells, and platelets) An unusual or allergic reaction to fulvestrant, other medications, foods, dyes, or preservatives Pregnant or trying to get pregnant Breastfeeding How should I use this medication? This medication is injected into a muscle. It is given by your care team in a hospital or clinic setting. Talk to your care team about the use of this medication in children. Special care may be needed. Overdosage: If you think you have taken too much of this medicine contact a poison control center or emergency room at once. NOTE: This medicine is only for you. Do not share this medicine with others. What if I miss a dose? Keep appointments for follow-up doses. It is important not to miss your dose. Call your care team if you are unable to keep an appointment. What may interact with this medication? Fluoroestradiol F18 This list may not describe all possible interactions. Give your health care provider a list of all the medicines, herbs, non-prescription drugs, or dietary supplements you use. Also tell them if you smoke, drink alcohol, or use illegal drugs. Some items may interact with your medicine. What should I watch for while using this medication? Your condition will be monitored carefully while you are receiving this medication. You may need blood work done while you are taking this medication. This medication is injected into a muscle. Talk  to your care team if you also take medications that prevent or treat blood clots, such as warfarin. Blood thinners may increase the risk of bleeding or bruising in the muscle where this medication is injected. The benefits of this medication may outweigh the risks. Your care team can help you find the option that works for you. They can also help limit the risk of bleeding. Talk to your care team if you may be pregnant. Serious birth defects can occur if you take this medication during pregnancy and for 1 year after the last dose. You will need a negative pregnancy test before starting this medication. Contraception is recommended while taking this medication and for 1 year after the last dose. Your care team can help you find the option that works for you. Do not breastfeed while taking this medication and for 1 year after the last dose. This medication may cause infertility. Talk to your care team if you are concerned about your fertility. What side effects may I notice from receiving this medication? Side effects that you should report to your care team as soon as possible: Allergic reactions or angioedema--skin rash, itching or hives, swelling of the face, eyes, lips, tongue, arms, or legs, trouble swallowing or breathing Pain, tingling, or numbness in the hands or feet Side effects that usually do not require medical attention (report to your care team if they continue or are bothersome): Bone, joint, or muscle pain Constipation Headache Hot flashes Nausea Pain, redness, or irritation at injection site Unusual weakness or fatigue This list may not describe all possible side effects. Call your doctor for medical advice about side  effects. You may report side effects to FDA at 1-800-FDA-1088. Where should I keep my medication? This medication is given in a hospital or clinic. It will not be stored at home. NOTE: This sheet is a summary. It may not cover all possible information. If you have  questions about this medicine, talk to your doctor, pharmacist, or health care provider.  2024 [employer] Standard (2022-11-14 00:00:00)Denosumab Injection (Oncology) What is this medication? DENOSUMAB (den oh SUE mab) prevents weakened bones caused by cancer. It may also be used to treat noncancerous bone tumors that cannot be removed by surgery. It can also be used to treat high calcium levels in the blood caused by cancer. It works by blocking a protein that causes bones to break down quickly. This slows down the release of calcium from bones, which lowers calcium levels in your blood. It also makes your bones stronger and less likely to break (fracture). This medicine may be used for other purposes; ask your health care provider or pharmacist if you have questions. COMMON BRAND NAME(S): XGEVA What should I tell my care team before I take this medication? They need to know if you have any of these conditions: Dental disease Having surgery or tooth extraction Infection Kidney disease Low levels of calcium or vitamin D in the blood Malnutrition On hemodialysis Skin conditions or sensitivity Thyroid or parathyroid disease An unusual reaction to denosumab, other medications, foods, dyes, or preservatives Pregnant or trying to get pregnant Breast-feeding How should I use this medication? This medication is for injection under the skin. It is given by your care team in a hospital or clinic setting. A special MedGuide will be given to you before each treatment. Be sure to read this information carefully each time. Talk to your care team about the use of this medication in children. While it may be prescribed for children as young as 13 years for selected conditions, precautions do apply. Overdosage: If you think you have taken too much of this medicine contact a poison control center or emergency room at once. NOTE: This medicine is only for you. Do not share this medicine with others. What  if I miss a dose? Keep appointments for follow-up doses. It is important not to miss your dose. Call your care team if you are unable to keep an appointment. What may interact with this medication? Do not take this medication with any of the following: Other medications containing denosumab This medication may also interact with the following: Medications that lower your chance of fighting infection Steroid medications, such as prednisone or cortisone This list may not describe all possible interactions. Give your health care provider a list of all the medicines, herbs, non-prescription drugs, or dietary supplements you use. Also tell them if you smoke, drink alcohol, or use illegal drugs. Some items may interact with your medicine. What should I watch for while using this medication? Your condition will be monitored carefully while you are receiving this medication. You may need blood work while taking this medication. This medication may increase your risk of getting an infection. Call your care team for advice if you get a fever, chills, sore throat, or other symptoms of a cold or flu. Do not treat yourself. Try to avoid being around people who are sick. You should make sure you get enough calcium and vitamin D while you are taking this medication, unless your care team tells you not to. Discuss the foods you eat and the vitamins you take with your  care team. Some people who take this medication have severe bone, joint, or muscle pain. This medication may also increase your risk for jaw problems or a broken thigh bone. Tell your care team right away if you have severe pain in your jaw, bones, joints, or muscles. Tell your care team if you have any pain that does not go away or that gets worse. Talk to your care team if you may be pregnant. Serious birth defects can occur if you take this medication during pregnancy and for 5 months after the last dose. You will need a negative pregnancy test before  starting this medication. Contraception is recommended while taking this medication and for 5 months after the last dose. Your care team can help you find the option that works for you. What side effects may I notice from receiving this medication? Side effects that you should report to your care team as soon as possible: Allergic reactions--skin rash, itching, hives, swelling of the face, lips, tongue, or throat Bone, joint, or muscle pain Low calcium level--muscle pain or cramps, confusion, tingling, or numbness in the hands or feet Osteonecrosis of the jaw--pain, swelling, or redness in the mouth, numbness of the jaw, poor healing after dental work, unusual discharge from the mouth, visible bones in the mouth Side effects that usually do not require medical attention (report to your care team if they continue or are bothersome): Cough Diarrhea Fatigue Headache Nausea This list may not describe all possible side effects. Call your doctor for medical advice about side effects. You may report side effects to FDA at 1-800-FDA-1088. Where should I keep my medication? This medication is given in a hospital or clinic. It will not be stored at home. NOTE: This sheet is a summary. It may not cover all possible information. If you have questions about this medicine, talk to your doctor, pharmacist, or health care provider.  2024 [employer] Standard (2021-07-31 00:00:00)

## 2023-05-19 NOTE — Progress Notes (Signed)
 [name]  [address] [place],  [place]  [address] [phone]  Clinic Day: 05/19/23  Referring physician: Casper Harrison, Stephanie Coup, *  ASSESSMENT & PLAN:  Assessment: Primary malignant neoplasm of breast with metastasis [facility]) Patient has a complicated oncology history history of invasive ductal carcinoma of the right breast diagnosed in 1997.  She was treated with mastectomy and adjuvant chemotherapy.  She also participated in clinical trial using adjuvant high-dose chemotherapy followed by stem cell transplant.  She was treated with adjuvant tamoxifen for 5 years.  She developed biopsy-proven liver metastasis, estrogen and HER2 receptor positive, in November 2007.  She received trastuzumab for an unknown period of time.  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  She was started on zoledronic acid in July and anastrozole in August, 2015.    She developed a metastatic lesion of the right anterior chest in August, 2017 confirmed to be adenocarcinoma and treated with excision and radiation.  Anastrozole was discontinued and she was switched to letrozole.  Palbociclib was added in January, 2018 after PET imaging revealed 2 new foci at T8 and the manubrium.  The dose of palbociclib had to be decreased over time.  Zoledronic acid was discontinued in January, 2019.  Letrozole was discontinued due to progression and was started on fulvestrant in February 2019 due to progression. She received palliative radiation to the cervical spine in February, 2019. Palbociclib was held during radiation and resumed at 75 mg daily once completed. Denosumab was started in March, 2019.  She has remained on this regimen without evidence of progression.  Most recent imaging was a PET in January, 2025 which was stable, with evidence of treated bone metastases.   Severe Osteoarthritis She has already had knee replacement and right hip replacement.  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   Plan:  She is scheduled to have her annual mammogram next week and will see her orthopedic surgeon next week also. She informed me that she had hip replacement surgery in January and is doing well and walking without a cane or walker. She had a PET scan done on 04/01/2023 that revealed no evidence of hypermetabolic local recurrent breast cancer or visible distal metastatic disease and no significant interval changes in the diffuse osteoblastic metastatic lesion in the axial and proximal appendicular skeleton without abnormal FDG activity compatible with chronic treated disease. She continues Faslodex and Denosumab injections every 4 weeks and is on her week off of Ibrance. She has a low WBC of 3.3 with an ANC of 1.7, a low hemoglobin of 10.8 up from 9.6, and platelet count of 186,000. She has a mildly low sodium of 132 while the rest of her CMP is normal. Her CA 27.29 today is pending. She will return every 4 weeks for repeat CBC. I will see her back in 3 months with CBC, CMP, and CA 27.29. The patient understands the plans discussed today and is in agreement with them.  She knows to contact our office if she develops concerns prior to her next appointment.  I provided 30 minutes of face-to-face time during this encounter and > 50% was spent counseling as documented under my assessment and plan.   Dellia Beckwith, MD [facility] [address] [place] [address] Dept: [phone] Dept  Fax: [phone]   No orders of the defined types were placed in this encounter.   CHIEF COMPLAINT:  CC: Recurrent hormone receptor positive breast cancer   Current Treatment:  Palbociclib/fulvestrant/denosumab   HISTORY OF PRESENT ILLNESS:  Jade Mooney is a 78 y.o. female who we began seeing in January 2023  for transfer of care from Dr. Ruthann Cancer for the continued treatment and management of metastatic breast cancer. She was originally diagnosed with stage III hormone receptor positive right breast cancer in 1997.  She was treated with right mastectomy and adjuvant chemotherapy with Taxotere for 4 cycles.  She also participated in a [facility] protocol with high-dose chemotherapy, cyclophosphamide/carboplatin/BCNU, followed by stem cell rescue in September 1997. She completed 5 years of tamoxifen in November 2002.  Unfortunately, she developed metastatic liver lesions, biopsy proven metastatic breast cancer, estrogen receptor positive and HER2 receptor positive, in November 2007.  She was treated with trastuzumab for an unknown period of time.    In July 2015, she was found to have incidental findings of mixed lytic and sclerotic bony lesions on CT abdomen and pelvis. She underwent biopsy of the right iliac bone and surgical pathology confirmed invasive ductal carcinoma, grade 2, estrogen and progesterone receptor positive. Bone density from May 2015 was normal. She was started on zolendronic acid every 12 weeks in July 2015. She was also found to have three scalp lesions, biopsied in July 2015 confirming metastatic breast cancer. HER2 negative. She was started on anastrozole in August 2015.   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     Anastrozole was discontinued and she was switched to letrozole in April 2018.  Palbociclib was added in January 2018 after PET imaging revealed 2 new foci at T8 and the manubrium.   Zoledronic acid was discontinued in January 2019.  Letrozole was discontinued in February 2019 due to progression. She was started on fulvestrant in February 2019. She received palliative radiation to the cervical spine in February 2019. Palbociclib was held during that time and resumed at 75 mg  daily once completed. Denosumab was started in March 2019.    She has continued on palbociclib 75 mg daily for 3 weeks on and 1 week off, along with fulvestrant and denosumab every 4 weeks.  Most recent imaging was a PET scan in December 2023 revealed significant interval change in the diffuse osteoblastic metastatic lesions in the axial and proximal appendicular skeleton without abnormal FDG avidity compatible with chronic treated disease. No evidence of hypermetabolic local recurrent breast cancer or viable distant metastatic disease.    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 Patient states that she feels well but complains of tingling in her right foot. She is scheduled to receive her annual mammogram next week and will see her orthopedic surgeon next week also. She informed me  that she had hip replacement surgery in January and is doing well and walking without a cane or walker. She had a PET scan done on 04/01/2023 that revealed no evidence of hypermetabolic local recurrent breast cancer or visible distal metastatic disease and no significant interval changes in the diffuse osteoblastic metastatic lesion in the axial and proximal appendicular skeleton without abnormal FDG activity compatible with chronic treated disease. She continues Faslodex and Denosumab injections every 4 weeks and is on her week off of Ibrance. She has a low WBC of 3.3  with an ANC of 1.7, a low hemoglobin of 10.8 up from 9.6, and platelet count of 186,000. She has a mildly low sodium of 132 while the rest of her CMP is normal. Her CA 27.29 today is pending. She will return every 4 weeks for repeat CBC, CMP and CA 27.29. I will see her back in 3 months with CBC, CMP, and CA 27.29.  She denies signs of infection such as sore throat, sinus drainage, cough, or urinary symptoms.  She denies fevers or recurrent chills. She denies pain. She denies nausea, vomiting, chest pain, dyspnea or cough. Her appetite is good and her weight has increased 2 pounds over last month . She is accompanied by her husband at today's appointment.   REVIEW OF SYSTEMS:  Review of Systems  Constitutional: Negative.  Negative for appetite change, chills, diaphoresis, fatigue, fever and unexpected weight change.  HENT:  Negative.  Negative for hearing loss, lump/mass, mouth sores, nosebleeds, sore throat, tinnitus, trouble swallowing and voice change.   Eyes: Negative.  Negative for eye problems and icterus.  Respiratory: Negative.  Negative for chest tightness, cough, hemoptysis, shortness of breath and wheezing.   Cardiovascular: Negative.  Negative for chest pain, leg swelling and palpitations.  Gastrointestinal: Negative.  Negative for abdominal distention, abdominal pain, blood in stool, constipation, diarrhea, nausea, rectal pain and vomiting.  Endocrine: Negative.   Genitourinary: Negative.  Negative for bladder incontinence, difficulty urinating, dyspareunia, dysuria, frequency, hematuria, menstrual problem, nocturia, pelvic pain, vaginal bleeding and vaginal discharge.   Musculoskeletal:  Positive for arthralgias, back pain and myalgias. Negative for flank pain, gait problem, neck pain and neck stiffness.       Pain in her bones and  knees (chronic)    Skin: Negative.  Negative for itching, rash and wound.  Neurological:  Negative for dizziness, extremity weakness, gait problem,  headaches, light-headedness, numbness, seizures and speech difficulty.  Hematological: Negative.  Negative for adenopathy. Does not bruise/bleed easily.  Psychiatric/Behavioral: Negative.  Negative for confusion, decreased concentration, depression, sleep disturbance and suicidal ideas. The patient is not nervous/anxious.     VITALS:  Blood pressure (!) 180/86, pulse 71, temperature 97.9 F (36.6 C), temperature source Oral, resp. rate 18, height 5\' 2"  (1.575 m), weight 180 lb (81.6 kg), SpO2 99%.  Wt Readings from Last 3 Encounters:  05/19/23 180 lb (81.6 kg)  04/13/23 176 lb 5.9 oz (80 kg)  03/31/23 178 lb (80.7 kg)    Body mass index is 32.92 kg/m.  Performance status (ECOG): 1 - Symptomatic but completely ambulatory  PHYSICAL EXAM:  Physical Exam Vitals and nursing note reviewed. Exam conducted with a chaperone present.  Constitutional:      General: She is not in acute distress.    Appearance: Normal appearance. She is normal weight. She is not ill-appearing, toxic-appearing or diaphoretic.  HENT:     Head: Normocephalic and atraumatic.     Right Ear: Tympanic membrane, ear canal and  external ear normal. There is no impacted cerumen.     Left Ear: Tympanic membrane, ear canal and external ear normal. There is no impacted cerumen.     Nose: Nose normal. No congestion or rhinorrhea.     Mouth/Throat:     Mouth: Mucous membranes are moist.     Pharynx: Oropharynx is clear. No oropharyngeal exudate or posterior oropharyngeal erythema.  Eyes:     General: No scleral icterus.       Right eye: No discharge.        Left eye: No discharge.     Extraocular Movements: Extraocular movements intact.     Conjunctiva/sclera: Conjunctivae normal.     Pupils: Pupils are equal, round, and reactive to light.  Neck:     Vascular: No carotid bruit.  Cardiovascular:     Rate and Rhythm: Normal rate and regular rhythm.     Pulses: Normal pulses.     Heart sounds: Normal heart sounds. No  murmur heard.    No friction rub. No gallop.  Pulmonary:     Effort: Pulmonary effort is normal. No respiratory distress.     Breath sounds: Normal breath sounds. No stridor. No wheezing, rhonchi or rales.  Chest:     Chest wall: No tenderness.  Abdominal:     General: Bowel sounds are normal. There is no distension.     Palpations: Abdomen is soft. There is no hepatomegaly, splenomegaly or mass.     Tenderness: There is no abdominal tenderness. There is no right CVA tenderness, left CVA tenderness, guarding or rebound.     Hernia: No hernia is present.     Comments: Liver feels normal  Musculoskeletal:        General: No swelling, deformity or signs of injury. Normal range of motion.     Cervical back: Normal range of motion and neck supple. No rigidity or tenderness.     Right lower leg: No edema.     Left lower leg: No edema.     Comments: Persistent induration of the popliteal and upper left calf area  Lymphadenopathy:     Cervical: No cervical adenopathy.     Right cervical: No superficial, deep or posterior cervical adenopathy.    Left cervical: No superficial, deep or posterior cervical adenopathy.     Upper Body:     Right upper body: No supraclavicular, axillary or pectoral adenopathy.     Left upper body: No supraclavicular, axillary or pectoral adenopathy.  Skin:    General: Skin is warm and dry.     Coloration: Skin is not jaundiced or pale.     Findings: No bruising, erythema, lesion or rash.  Neurological:     General: No focal deficit present.     Mental Status: She is alert and oriented to person, place, and time. Mental status is at baseline.     Cranial Nerves: No cranial nerve deficit.     Sensory: No sensory deficit.     Motor: No weakness.     Coordination: Coordination normal.     Gait: Gait normal.     Deep Tendon Reflexes: Reflexes normal.  Psychiatric:        Mood and Affect: Mood normal.        Behavior: Behavior normal.        Thought Content:  Thought content normal.        Judgment: Judgment normal.    LABS:      Latest Ref Rng & Units  05/19/2023   10:27 AM 04/14/2023    3:24 AM 03/31/2023   11:34 AM  CBC  WBC 4.0 - 10.5 K/uL 3.3  4.6  3.2   Hemoglobin 12.0 - 15.0 g/dL 40.9  9.6  81.1   Hematocrit 36.0 - 46.0 % 32.0  29.3  37.3   Platelets 150 - 400 K/uL 186  180  229       Latest Ref Rng & Units 05/19/2023   10:27 AM 04/14/2023    3:24 AM 03/31/2023   11:34 AM  CMP  Glucose 70 - 99 mg/dL 99  914  97   BUN 8 - 23 mg/dL 15  12  16    Creatinine 0.44 - 1.00 mg/dL 7.82  9.56  2.13   Sodium 135 - 145 mmol/L 132  135  133   Potassium 3.5 - 5.1 mmol/L 4.3  3.8  4.2   Chloride 98 - 111 mmol/L 98  108  99   CO2 22 - 32 mmol/L 22  20  21    Calcium 8.9 - 10.3 mg/dL 9.3  7.6  9.3   Total Protein 6.5 - 8.1 g/dL 6.8     Total Bilirubin 0.0 - 1.2 mg/dL 0.3     Alkaline Phos 38 - 126 U/L 74     AST 15 - 41 U/L 21     ALT 0 - 44 U/L 11      No results found for: "CEA1", "CEA" / No results found for: "CEA1", "CEA" No results found for: "PSA1" No results found for: "YQM578" No results found for: "CAN125"  No results found for: "TOTALPROTELP", "ALBUMINELP", "A1GS", "A2GS", "BETS", "BETA2SER", "GAMS", "MSPIKE", "SPEI" No results found for: "TIBC", "FERRITIN", "IRONPCTSAT" Lab Results  Component Value Date   LDH 138 01/17/2011   LDH 155 12/28/2009   LDH 155 12/29/2008   STUDIES:     HISTORY:   Past Medical History:  Diagnosis Date   Anxiety    Cancer (HCC)    Breast 1997 right tx with mastectomy and chemo, metastatic now   GERD (gastroesophageal reflux disease)    Hypercholesterolemia    Hypertension    Hypothyroidism    Osteoarthritis    oa   Personal history of chemotherapy    Personal history of radiation therapy    Secondary malignant neoplasm of bone and bone marrow (HCC)    TIA (transient ischemic attack) last 09/10/20   x 3 total, they seem to occur every 5 years    Past Surgical History:  Procedure  Laterality Date   CATARACT EXTRACTION, BILATERAL     CERVICAL FUSION  2020   MASTECTOMY MODIFIED RADICAL Right 1997   porta cath insertion  1997   later removed   radiation tx  finished 02-25-16   x 20 tx   TONSILLECTOMY     TOTAL HIP ARTHROPLASTY Left 03/12/2016   Procedure: LEFT TOTAL HIP ARTHROPLASTY ANTERIOR APPROACH;  Surgeon: Ollen Gross, MD;  Location: [facility];  Service: Orthopedics;  Laterality: Left;   TOTAL HIP ARTHROPLASTY Right 04/13/2023   Procedure: TOTAL HIP ARTHROPLASTY ANTERIOR APPROACH;  Surgeon: Ollen Gross, MD;  Location: [facility];  Service: Orthopedics;  Laterality: Right;   TOTAL KNEE ARTHROPLASTY Left    TOTAL KNEE ARTHROPLASTY Right 10/30/2021   Procedure: TOTAL KNEE ARTHROPLASTY;  Surgeon: Eugenia Mcalpine, MD;  Location: [facility];  Service: Orthopedics;  Laterality: Right;  adductor canal 120   TUBAL LIGATION      Family History  Problem Relation Age of  Onset   Breast cancer Maternal Aunt     Social History:  reports that she has never smoked. She has never used smokeless tobacco. She reports current alcohol use of about 1.0 standard drink of alcohol per week. She reports that she does not use drugs.The patient is accompanied by her husband today.  Allergies:  Allergies  Allergen Reactions   Oxycodone-Aspirin Nausea And Vomiting and Other (See Comments)   Erythromycin Nausea Only   Oxycodone Other (See Comments)    oxycodone   Oxycodone Hcl Nausea And Vomiting   Tramadol Itching   Amoxicillin Hives    Has patient had a PCN reaction causing immediate rash, facial/tongue/throat swelling, SOB or lightheadedness with hypotension:unsure  Has patient had a PCN reaction causing severe rash involving mucus membranes or skin necrosis:No  Has patient had a PCN reaction that required hospitalization:No  Has patient had a PCN reaction occurring within the last 10 years: Yes  If all of the above answers are "NO", then may proceed with Cephalosporin use.  Has  patient had a PCN reaction causing immediate rash, facial/tongue/throat swelling, SOB or lightheadedness with hypotension:unsure, Has patient had a PCN reaction causing severe rash involving mucus membranes or skin necrosis:No, Has patient had a PCN reaction that required hospitalization:No, Has patient had a PCN reaction occurring within the last 10 years: Yes, If all of the above answers are "NO", then may proceed with Cephalosporin use.    Current Medications: Current Outpatient Medications  Medication Sig Dispense Refill   acetaminophen (TYLENOL) 500 MG tablet Take 1,000 mg by mouth every 6 (six) hours as needed for mild pain.     acidophilus (RISAQUAD) CAPS capsule Take 1 capsule by mouth daily.     ascorbic acid (VITAMIN C) 1000 MG tablet Take 1,000 mg by mouth daily.     aspirin 325 MG tablet Take 325 mg by mouth daily.     beta carotene 15 MG capsule Take 15 mg by mouth daily.     Biotin 5000 MCG TABS Take 5,000 mcg by mouth daily.     CALCIUM CITRATE-VITAMIN D PO Take 1 tablet by mouth in the morning, at noon, in the evening, and at bedtime.     clopidogrel (PLAVIX) 75 MG tablet Take 1 tablet (75 mg total) by mouth daily.     cyanocobalamin (,VITAMIN B-12,) 1000 MCG/ML injection Inject 1,000 mcg into the muscle every 30 (thirty) days.     Denosumab (XGEVA Gray) Inject 1 Dose into the skin every 30 (thirty) days.     famotidine (PEPCID) 40 MG tablet Take 40 mg by mouth daily.     glucosamine-chondroitin 500-400 MG tablet Take 1 tablet by mouth daily.     HYDROcodone-acetaminophen (NORCO/VICODIN) 5-325 MG tablet Take 1-2 tablets by mouth every 6 (six) hours as needed for severe pain (pain score 7-10). 42 tablet 0   ketotifen (ZADITOR) 0.025 % ophthalmic solution Place 1 drop into both eyes daily as needed (allergies).     levothyroxine (SYNTHROID, LEVOTHROID) 88 MCG tablet Take 88 mcg by mouth daily before breakfast.     LORazepam (ATIVAN) 1 MG tablet Take 1 tablet (1 mg total) by mouth  every 8 (eight) hours as needed for anxiety. 30 tablet 0   losartan (COZAAR) 25 MG tablet Take 25 mg by mouth daily.     Lutein 20 MG CAPS Take 20 mg by mouth daily.     meclizine (ANTIVERT) 25 MG tablet Take 25 mg by mouth 3 (three) times daily  as needed for dizziness.     melatonin 5 MG TABS Take 5 mg by mouth at bedtime as needed (sleep).     metroNIDAZOLE (METROCREAM) 0.75 % cream Apply 1 Application topically daily.     Multiple Vitamins-Minerals (MULTIVITAMIN WITH MINERALS) tablet Take 1 tablet by mouth daily.     Omega-3 1000 MG CAPS Take 1,000 mg by mouth in the morning and at bedtime.     palbociclib (IBRANCE) 75 MG tablet Take 1 tablet (75 mg total) by mouth daily. Take as directed by MD for 21 days on, 7 days off, repeat every 28 days. (Patient taking differently: Take 75 mg by mouth every other day. Take as directed by MD for 21 days on, 7 days off, repeat every 28 days.) 21 tablet 5   pantoprazole (PROTONIX) 40 MG tablet Take 40 mg by mouth daily.     Polyethyl Glycol-Propyl Glycol 0.4-0.3 % SOLN Place 1-2 drops into both eyes 2 (two) times daily.     rOPINIRole (REQUIP) 2 MG tablet Take 4 mg by mouth at bedtime.     rosuvastatin (CRESTOR) 20 MG tablet Take 1 tablet (20 mg total) by mouth daily.     vitamin E 180 MG (400 UNITS) capsule Take 400 Units by mouth daily.     zinc gluconate 50 MG tablet Take 50 mg by mouth daily.     No current facility-administered medications for this visit.    I,Jasmine M Lassiter,acting as a scribe for Dellia Beckwith, MD.,have documented all relevant documentation on the behalf of Dellia Beckwith, MD,as directed by  Dellia Beckwith, MD while in the presence of Dellia Beckwith, MD.

## 2023-05-20 LAB — CANCER ANTIGEN 27.29: CA 27.29: 39.2 U/mL — ABNORMAL HIGH (ref 0.0–38.6)

## 2023-05-25 ENCOUNTER — Encounter: Payer: Self-pay | Admitting: Oncology

## 2023-05-26 ENCOUNTER — Ambulatory Visit
Admission: RE | Admit: 2023-05-26 | Discharge: 2023-05-26 | Disposition: A | Discharge status: Home / Self Care | Payer: Medicare Other | Source: Ambulatory Visit | Attending: Oncology | Admitting: Oncology

## 2023-05-26 DIAGNOSIS — Z4889 Encounter for other specified surgical aftercare: Secondary | ICD-10-CM | POA: Diagnosis not present

## 2023-05-26 DIAGNOSIS — Z1231 Encounter for screening mammogram for malignant neoplasm of breast: Secondary | ICD-10-CM

## 2023-05-26 DIAGNOSIS — M1611 Unilateral primary osteoarthritis, right hip: Secondary | ICD-10-CM | POA: Diagnosis not present

## 2023-05-29 DIAGNOSIS — E785 Hyperlipidemia, unspecified: Secondary | ICD-10-CM | POA: Diagnosis not present

## 2023-05-29 DIAGNOSIS — E039 Hypothyroidism, unspecified: Secondary | ICD-10-CM | POA: Diagnosis not present

## 2023-05-29 DIAGNOSIS — E538 Deficiency of other specified B group vitamins: Secondary | ICD-10-CM | POA: Diagnosis not present

## 2023-05-29 DIAGNOSIS — Z79899 Other long term (current) drug therapy: Secondary | ICD-10-CM | POA: Diagnosis not present

## 2023-06-03 ENCOUNTER — Other Ambulatory Visit (HOSPITAL_COMMUNITY): Payer: Self-pay

## 2023-06-03 ENCOUNTER — Other Ambulatory Visit: Payer: Self-pay

## 2023-06-03 NOTE — Progress Notes (Signed)
 Specialty Pharmacy Refill Coordination Note  Jade Mooney is a 78 y.o. female contacted today regarding refills of specialty medication(s) Palbociclib [name]   Patient requested (Patient-Rptd) Delivery   Delivery date: (Patient-Rptd) 07/02/23   Verified address: (Patient-Rptd) [address], [place], [place]   Medication will be filled on 04.09.25.

## 2023-06-04 ENCOUNTER — Other Ambulatory Visit (HOSPITAL_COMMUNITY): Payer: Self-pay

## 2023-06-04 DIAGNOSIS — C50911 Malignant neoplasm of unspecified site of right female breast: Secondary | ICD-10-CM | POA: Diagnosis not present

## 2023-06-04 DIAGNOSIS — E538 Deficiency of other specified B group vitamins: Secondary | ICD-10-CM | POA: Diagnosis not present

## 2023-06-04 DIAGNOSIS — I10 Essential (primary) hypertension: Secondary | ICD-10-CM | POA: Diagnosis not present

## 2023-06-04 DIAGNOSIS — E782 Mixed hyperlipidemia: Secondary | ICD-10-CM | POA: Diagnosis not present

## 2023-06-04 DIAGNOSIS — E039 Hypothyroidism, unspecified: Secondary | ICD-10-CM | POA: Diagnosis not present

## 2023-06-16 ENCOUNTER — Inpatient Hospital Stay: Payer: Medicare Other | Attending: Oncology

## 2023-06-16 ENCOUNTER — Inpatient Hospital Stay: Payer: Medicare Other

## 2023-06-16 VITALS — BP 169/89 | Temp 98.0°F | Resp 18 | Ht 62.0 in | Wt 179.8 lb

## 2023-06-16 DIAGNOSIS — C50919 Malignant neoplasm of unspecified site of unspecified female breast: Secondary | ICD-10-CM | POA: Insufficient documentation

## 2023-06-16 DIAGNOSIS — Z5111 Encounter for antineoplastic chemotherapy: Secondary | ICD-10-CM | POA: Diagnosis not present

## 2023-06-16 DIAGNOSIS — C7951 Secondary malignant neoplasm of bone: Secondary | ICD-10-CM | POA: Insufficient documentation

## 2023-06-16 DIAGNOSIS — Z170 Estrogen receptor positive status [ER+]: Secondary | ICD-10-CM | POA: Insufficient documentation

## 2023-06-16 DIAGNOSIS — C50912 Malignant neoplasm of unspecified site of left female breast: Secondary | ICD-10-CM

## 2023-06-16 LAB — CMP (CANCER CENTER ONLY)
ALT: 11 U/L (ref 0–44)
AST: 22 U/L (ref 15–41)
Albumin: 4.5 g/dL (ref 3.5–5.0)
Alkaline Phosphatase: 52 U/L (ref 38–126)
Anion gap: 11 (ref 5–15)
BUN: 19 mg/dL (ref 8–23)
CO2: 22 mmol/L (ref 22–32)
Calcium: 9.7 mg/dL (ref 8.9–10.3)
Chloride: 99 mmol/L (ref 98–111)
Creatinine: 0.89 mg/dL (ref 0.44–1.00)
GFR, Estimated: >60 mL/min (ref >60)
Glucose, Bld: 92 mg/dL (ref 70–99)
Potassium: 4.3 mmol/L (ref 3.5–5.1)
Sodium: 132 mmol/L — ABNORMAL LOW (ref 135–145)
Total Bilirubin: 0.4 mg/dL (ref 0.0–1.2)
Total Protein: 6.5 g/dL (ref 6.5–8.1)

## 2023-06-16 LAB — CBC WITH DIFFERENTIAL (CANCER CENTER ONLY)
Abs Immature Granulocytes: 0.01 10*3/uL (ref 0.00–0.07)
Basophils Absolute: 0 10*3/uL (ref 0.0–0.1)
Basophils Relative: 1 %
Eosinophils Absolute: 0.1 10*3/uL (ref 0.0–0.5)
Eosinophils Relative: 2 %
HCT: 33.2 % — ABNORMAL LOW (ref 36.0–46.0)
Hemoglobin: 11.3 g/dL — ABNORMAL LOW (ref 12.0–15.0)
Immature Granulocytes: 1 %
Immature Platelet Fraction: 0.7 % — ABNORMAL LOW (ref 1.2–8.6)
Lymphocytes Relative: 30 %
Lymphs Abs: 0.6 10*3/uL — ABNORMAL LOW (ref 0.7–4.0)
MCH: 30.2 pg (ref 26.0–34.0)
MCHC: 34 g/dL (ref 30.0–36.0)
MCV: 88.8 fL (ref 80.0–100.0)
Monocytes Absolute: 0.2 10*3/uL (ref 0.1–1.0)
Monocytes Relative: 11 %
Neutro Abs: 1.1 10*3/uL — ABNORMAL LOW (ref 1.7–7.7)
Neutrophils Relative %: 55 %
Platelet Count: 190 10*3/uL (ref 150–400)
RBC: 3.74 MIL/uL — ABNORMAL LOW (ref 3.87–5.11)
RDW: 16.3 % — ABNORMAL HIGH (ref 11.5–15.5)
WBC Count: 2.1 10*3/uL — ABNORMAL LOW (ref 4.0–10.5)
nRBC: 0 % (ref 0.0–0.2)
nRBC: 0 /100{WBCs}

## 2023-06-16 MED ORDER — DENOSUMAB 120 MG/1.7ML ~~LOC~~ SOLN
120.0000 mg | Freq: Once | SUBCUTANEOUS | Status: AC
  Administered 2023-06-16: 120 mg via SUBCUTANEOUS
  Filled 2023-06-16: qty 1.7

## 2023-06-16 MED ORDER — FULVESTRANT 250 MG/5ML IM SOSY
500.0000 mg | PREFILLED_SYRINGE | Freq: Once | INTRAMUSCULAR | Status: AC
  Administered 2023-06-16: 500 mg via INTRAMUSCULAR
  Filled 2023-06-16: qty 10

## 2023-06-17 LAB — CANCER ANTIGEN 27.29: CA 27.29: 38.1 U/mL (ref 0.0–38.6)

## 2023-06-29 DIAGNOSIS — Z85830 Personal history of malignant neoplasm of bone: Secondary | ICD-10-CM | POA: Diagnosis not present

## 2023-06-29 DIAGNOSIS — M545 Low back pain, unspecified: Secondary | ICD-10-CM | POA: Diagnosis not present

## 2023-06-29 DIAGNOSIS — M47816 Spondylosis without myelopathy or radiculopathy, lumbar region: Secondary | ICD-10-CM | POA: Diagnosis not present

## 2023-07-06 ENCOUNTER — Inpatient Hospital Stay: Attending: Oncology

## 2023-07-06 DIAGNOSIS — C50919 Malignant neoplasm of unspecified site of unspecified female breast: Secondary | ICD-10-CM | POA: Diagnosis not present

## 2023-07-06 DIAGNOSIS — C7951 Secondary malignant neoplasm of bone: Secondary | ICD-10-CM | POA: Diagnosis not present

## 2023-07-06 DIAGNOSIS — Z5111 Encounter for antineoplastic chemotherapy: Secondary | ICD-10-CM | POA: Diagnosis not present

## 2023-07-06 DIAGNOSIS — E538 Deficiency of other specified B group vitamins: Secondary | ICD-10-CM | POA: Diagnosis not present

## 2023-07-06 DIAGNOSIS — Z170 Estrogen receptor positive status [ER+]: Secondary | ICD-10-CM | POA: Diagnosis not present

## 2023-07-06 DIAGNOSIS — C787 Secondary malignant neoplasm of liver and intrahepatic bile duct: Secondary | ICD-10-CM | POA: Insufficient documentation

## 2023-07-06 LAB — CMP (CANCER CENTER ONLY)
ALT: 19 U/L (ref 0–44)
AST: 27 U/L (ref 15–41)
Albumin: 4.5 g/dL (ref 3.5–5.0)
Alkaline Phosphatase: 60 U/L (ref 38–126)
Anion gap: 11 (ref 5–15)
BUN: 11 mg/dL (ref 8–23)
CO2: 23 mmol/L (ref 22–32)
Calcium: 9.1 mg/dL (ref 8.9–10.3)
Chloride: 99 mmol/L (ref 98–111)
Creatinine: 0.76 mg/dL (ref 0.44–1.00)
GFR, Estimated: >60 mL/min (ref >60)
Glucose, Bld: 146 mg/dL — ABNORMAL HIGH (ref 70–99)
Potassium: 4.3 mmol/L (ref 3.5–5.1)
Sodium: 133 mmol/L — ABNORMAL LOW (ref 135–145)
Total Bilirubin: 0.3 mg/dL (ref 0.0–1.2)
Total Protein: 6.6 g/dL (ref 6.5–8.1)

## 2023-07-06 LAB — CBC WITH DIFFERENTIAL (CANCER CENTER ONLY)
Abs Immature Granulocytes: 0.02 10*3/uL (ref 0.00–0.07)
Basophils Absolute: 0 10*3/uL (ref 0.0–0.1)
Basophils Relative: 2 %
Eosinophils Absolute: 0 10*3/uL (ref 0.0–0.5)
Eosinophils Relative: 2 %
HCT: 34.3 % — ABNORMAL LOW (ref 36.0–46.0)
Hemoglobin: 11.2 g/dL — ABNORMAL LOW (ref 12.0–15.0)
Immature Granulocytes: 1 %
Lymphocytes Relative: 30 %
Lymphs Abs: 0.6 10*3/uL — ABNORMAL LOW (ref 0.7–4.0)
MCH: 28.9 pg (ref 26.0–34.0)
MCHC: 32.7 g/dL (ref 30.0–36.0)
MCV: 88.4 fL (ref 80.0–100.0)
Monocytes Absolute: 0.4 10*3/uL (ref 0.1–1.0)
Monocytes Relative: 21 %
Neutro Abs: 0.9 10*3/uL — ABNORMAL LOW (ref 1.7–7.7)
Neutrophils Relative %: 44 %
Platelet Count: 220 10*3/uL (ref 150–400)
RBC: 3.88 MIL/uL (ref 3.87–5.11)
RDW: 16.3 % — ABNORMAL HIGH (ref 11.5–15.5)
WBC Count: 2 10*3/uL — ABNORMAL LOW (ref 4.0–10.5)
nRBC: 0 % (ref 0.0–0.2)
nRBC: 0 /100{WBCs}

## 2023-07-07 LAB — CANCER ANTIGEN 27.29: CA 27.29: 36 U/mL (ref 0.0–38.6)

## 2023-07-14 ENCOUNTER — Telehealth: Payer: Self-pay

## 2023-07-14 ENCOUNTER — Inpatient Hospital Stay

## 2023-07-14 VITALS — BP 157/84 | HR 70 | Temp 97.9°F | Resp 18 | Ht 62.0 in

## 2023-07-14 DIAGNOSIS — C7951 Secondary malignant neoplasm of bone: Secondary | ICD-10-CM | POA: Diagnosis not present

## 2023-07-14 DIAGNOSIS — C50919 Malignant neoplasm of unspecified site of unspecified female breast: Secondary | ICD-10-CM

## 2023-07-14 DIAGNOSIS — C787 Secondary malignant neoplasm of liver and intrahepatic bile duct: Secondary | ICD-10-CM | POA: Diagnosis not present

## 2023-07-14 DIAGNOSIS — Z170 Estrogen receptor positive status [ER+]: Secondary | ICD-10-CM | POA: Diagnosis not present

## 2023-07-14 DIAGNOSIS — C50912 Malignant neoplasm of unspecified site of left female breast: Secondary | ICD-10-CM

## 2023-07-14 DIAGNOSIS — Z5111 Encounter for antineoplastic chemotherapy: Secondary | ICD-10-CM | POA: Diagnosis not present

## 2023-07-14 LAB — CMP (CANCER CENTER ONLY)
ALT: 18 U/L (ref 0–44)
AST: 28 U/L (ref 15–41)
Albumin: 4.5 g/dL (ref 3.5–5.0)
Alkaline Phosphatase: 69 U/L (ref 38–126)
Anion gap: 12 (ref 5–15)
BUN: 14 mg/dL (ref 8–23)
CO2: 22 mmol/L (ref 22–32)
Calcium: 9.2 mg/dL (ref 8.9–10.3)
Chloride: 99 mmol/L (ref 98–111)
Creatinine: 0.76 mg/dL (ref 0.44–1.00)
GFR, Estimated: >60 mL/min (ref >60)
Glucose, Bld: 141 mg/dL — ABNORMAL HIGH (ref 70–99)
Potassium: 4.2 mmol/L (ref 3.5–5.1)
Sodium: 134 mmol/L — ABNORMAL LOW (ref 135–145)
Total Bilirubin: 0.3 mg/dL (ref 0.0–1.2)
Total Protein: 6.9 g/dL (ref 6.5–8.1)

## 2023-07-14 LAB — CBC WITH DIFFERENTIAL (CANCER CENTER ONLY)
Abs Immature Granulocytes: 0.05 10*3/uL (ref 0.00–0.07)
Basophils Absolute: 0.1 10*3/uL (ref 0.0–0.1)
Basophils Relative: 2 %
Eosinophils Absolute: 0.1 10*3/uL (ref 0.0–0.5)
Eosinophils Relative: 3 %
HCT: 34.7 % — ABNORMAL LOW (ref 36.0–46.0)
Hemoglobin: 11.6 g/dL — ABNORMAL LOW (ref 12.0–15.0)
Immature Granulocytes: 1 %
Lymphocytes Relative: 21 %
Lymphs Abs: 0.8 10*3/uL (ref 0.7–4.0)
MCH: 29.7 pg (ref 26.0–34.0)
MCHC: 33.4 g/dL (ref 30.0–36.0)
MCV: 89 fL (ref 80.0–100.0)
Monocytes Absolute: 0.4 10*3/uL (ref 0.1–1.0)
Monocytes Relative: 11 %
Neutro Abs: 2.5 10*3/uL (ref 1.7–7.7)
Neutrophils Relative %: 62 %
Platelet Count: 241 10*3/uL (ref 150–400)
RBC: 3.9 MIL/uL (ref 3.87–5.11)
RDW: 17 % — ABNORMAL HIGH (ref 11.5–15.5)
WBC Count: 3.9 10*3/uL — ABNORMAL LOW (ref 4.0–10.5)
nRBC: 0 % (ref 0.0–0.2)
nRBC: 0 /100{WBCs}

## 2023-07-14 MED ORDER — FULVESTRANT 250 MG/5ML IM SOSY
500.0000 mg | PREFILLED_SYRINGE | Freq: Once | INTRAMUSCULAR | Status: AC
  Administered 2023-07-14: 500 mg via INTRAMUSCULAR
  Filled 2023-07-14: qty 10

## 2023-07-14 MED ORDER — DENOSUMAB 120 MG/1.7ML ~~LOC~~ SOLN
120.0000 mg | Freq: Once | SUBCUTANEOUS | Status: AC
  Administered 2023-07-14: 120 mg via SUBCUTANEOUS
  Filled 2023-07-14: qty 1.7

## 2023-07-14 NOTE — Patient Instructions (Signed)
 Denosumab Injection (Oncology) What is this medication? DENOSUMAB (den oh SUE mab) prevents weakened bones caused by cancer. It may also be used to treat noncancerous bone tumors that cannot be removed by surgery. It can also be used to treat high calcium levels in the blood caused by cancer. It works by blocking a protein that causes bones to break down quickly. This slows down the release of calcium from bones, which lowers calcium levels in your blood. It also makes your bones stronger and less likely to break (fracture). This medicine may be used for other purposes; ask your health care provider or pharmacist if you have questions. COMMON BRAND NAME(S): XGEVA What should I tell my care team before I take this medication? They need to know if you have any of these conditions: Dental disease Having surgery or tooth extraction Infection Kidney disease Low levels of calcium or vitamin D in the blood Malnutrition On hemodialysis Skin conditions or sensitivity Thyroid or parathyroid disease An unusual reaction to denosumab, other medications, foods, dyes, or preservatives Pregnant or trying to get pregnant Breast-feeding How should I use this medication? This medication is for injection under the skin. It is given by your care team in a hospital or clinic setting. A special MedGuide will be given to you before each treatment. Be sure to read this information carefully each time. Talk to your care team about the use of this medication in children. While it may be prescribed for children as young as 13 years for selected conditions, precautions do apply. Overdosage: If you think you have taken too much of this medicine contact a poison control center or emergency room at once. NOTE: This medicine is only for you. Do not share this medicine with others. What if I miss a dose? Keep appointments for follow-up doses. It is important not to miss your dose. Call your care team if you are unable to  keep an appointment. What may interact with this medication? Do not take this medication with any of the following: Other medications containing denosumab This medication may also interact with the following: Medications that lower your chance of fighting infection Steroid medications, such as prednisone or cortisone This list may not describe all possible interactions. Give your health care provider a list of all the medicines, herbs, non-prescription drugs, or dietary supplements you use. Also tell them if you smoke, drink alcohol, or use illegal drugs. Some items may interact with your medicine. What should I watch for while using this medication? Your condition will be monitored carefully while you are receiving this medication. You may need blood work while taking this medication. This medication may increase your risk of getting an infection. Call your care team for advice if you get a fever, chills, sore throat, or other symptoms of a cold or flu. Do not treat yourself. Try to avoid being around people who are sick. You should make sure you get enough calcium and vitamin D while you are taking this medication, unless your care team tells you not to. Discuss the foods you eat and the vitamins you take with your care team. Some people who take this medication have severe bone, joint, or muscle pain. This medication may also increase your risk for jaw problems or a broken thigh bone. Tell your care team right away if you have severe pain in your jaw, bones, joints, or muscles. Tell your care team if you have any pain that does not go away or that gets worse. Talk  to your care team if you may be pregnant. Serious birth defects can occur if you take this medication during pregnancy and for 5 months after the last dose. You will need a negative pregnancy test before starting this medication. Contraception is recommended while taking this medication and for 5 months after the last dose. Your care team  can help you find the option that works for you. What side effects may I notice from receiving this medication? Side effects that you should report to your care team as soon as possible: Allergic reactions--skin rash, itching, hives, swelling of the face, lips, tongue, or throat Bone, joint, or muscle pain Low calcium level--muscle pain or cramps, confusion, tingling, or numbness in the hands or feet Osteonecrosis of the jaw--pain, swelling, or redness in the mouth, numbness of the jaw, poor healing after dental work, unusual discharge from the mouth, visible bones in the mouth Side effects that usually do not require medical attention (report to your care team if they continue or are bothersome): Cough Diarrhea Fatigue Headache Nausea This list may not describe all possible side effects. Call your doctor for medical advice about side effects. You may report side effects to FDA at 1-800-FDA-1088. Where should I keep my medication? This medication is given in a hospital or clinic. It will not be stored at home. NOTE: This sheet is a summary. It may not cover all possible information. If you have questions about this medicine, talk to your doctor, pharmacist, or health care provider.  2024 [employer]/Gold Standard (2021-07-31 00:00:00)Fulvestrant Injection What is this medication? FULVESTRANT (ful VES trant) treats breast cancer. It works by blocking the hormone estrogen in breast tissue, which prevents breast cancer cells from spreading or growing. This medicine may be used for other purposes; ask your health care provider or pharmacist if you have questions. COMMON BRAND NAME(S): FASLODEX What should I tell my care team before I take this medication? They need to know if you have any of these conditions: Bleeding disorder Liver disease Low blood cell levels (white cells, red cells, and platelets) An unusual or allergic reaction to fulvestrant, other medications, foods, dyes, or  preservatives Pregnant or trying to get pregnant Breastfeeding How should I use this medication? This medication is injected into a muscle. It is given by your care team in a hospital or clinic setting. Talk to your care team about the use of this medication in children. Special care may be needed. Overdosage: If you think you have taken too much of this medicine contact a poison control center or emergency room at once. NOTE: This medicine is only for you. Do not share this medicine with others. What if I miss a dose? Keep appointments for follow-up doses. It is important not to miss your dose. Call your care team if you are unable to keep an appointment. What may interact with this medication? Fluoroestradiol F18 This list may not describe all possible interactions. Give your health care provider a list of all the medicines, herbs, non-prescription drugs, or dietary supplements you use. Also tell them if you smoke, drink alcohol, or use illegal drugs. Some items may interact with your medicine. What should I watch for while using this medication? Your condition will be monitored carefully while you are receiving this medication. You may need blood work done while you are taking this medication. This medication is injected into a muscle. Talk to your care team if you also take medications that prevent or treat blood clots, such as warfarin.  Blood thinners may increase the risk of bleeding or bruising in the muscle where this medication is injected. The benefits of this medication may outweigh the risks. Your care team can help you find the option that works for you. They can also help limit the risk of bleeding. Talk to your care team if you may be pregnant. Serious birth defects can occur if you take this medication during pregnancy and for 1 year after the last dose. You will need a negative pregnancy test before starting this medication. Contraception is recommended while taking this medication  and for 1 year after the last dose. Your care team can help you find the option that works for you. Do not breastfeed while taking this medication and for 1 year after the last dose. This medication may cause infertility. Talk to your care team if you are concerned about your fertility. What side effects may I notice from receiving this medication? Side effects that you should report to your care team as soon as possible: Allergic reactions or angioedema--skin rash, itching or hives, swelling of the face, eyes, lips, tongue, arms, or legs, trouble swallowing or breathing Pain, tingling, or numbness in the hands or feet Side effects that usually do not require medical attention (report to your care team if they continue or are bothersome): Bone, joint, or muscle pain Constipation Headache Hot flashes Nausea Pain, redness, or irritation at injection site Unusual weakness or fatigue This list may not describe all possible side effects. Call your doctor for medical advice about side effects. You may report side effects to FDA at 1-800-FDA-1088. Where should I keep my medication? This medication is given in a hospital or clinic. It will not be stored at home. NOTE: This sheet is a summary. It may not cover all possible information. If you have questions about this medicine, talk to your doctor, pharmacist, or health care provider.  2024 [employer]/Gold Standard (2022-11-14 00:00:00)

## 2023-07-14 NOTE — Telephone Encounter (Signed)
-----   Message from [name] sent at 07/09/2023  6:38 PM EDT ----- Regarding: labs Check on WBC and ANC

## 2023-07-15 LAB — CANCER ANTIGEN 27.29: CA 27.29: 35.3 U/mL (ref 0.0–38.6)

## 2023-07-16 ENCOUNTER — Telehealth: Payer: Self-pay

## 2023-07-16 NOTE — Telephone Encounter (Signed)
 Patient notified of message

## 2023-07-16 NOTE — Telephone Encounter (Signed)
-----   Message from [name] sent at 07/15/2023  6:50 PM EDT ----- Regarding: call Good, her WBC's much better and CA 27.29 is staying down.  Stay the course

## 2023-07-21 ENCOUNTER — Encounter: Payer: Self-pay | Admitting: Oncology

## 2023-07-22 DIAGNOSIS — M545 Low back pain, unspecified: Secondary | ICD-10-CM | POA: Diagnosis not present

## 2023-07-22 DIAGNOSIS — M47896 Other spondylosis, lumbar region: Secondary | ICD-10-CM | POA: Diagnosis not present

## 2023-07-23 ENCOUNTER — Other Ambulatory Visit (HOSPITAL_COMMUNITY): Payer: Self-pay

## 2023-07-30 DIAGNOSIS — Z85830 Personal history of malignant neoplasm of bone: Secondary | ICD-10-CM | POA: Diagnosis not present

## 2023-07-30 DIAGNOSIS — M533 Sacrococcygeal disorders, not elsewhere classified: Secondary | ICD-10-CM | POA: Diagnosis not present

## 2023-07-30 DIAGNOSIS — M47896 Other spondylosis, lumbar region: Secondary | ICD-10-CM | POA: Diagnosis not present

## 2023-07-30 DIAGNOSIS — M545 Low back pain, unspecified: Secondary | ICD-10-CM | POA: Diagnosis not present

## 2023-08-03 ENCOUNTER — Telehealth: Payer: Self-pay

## 2023-08-03 NOTE — Telephone Encounter (Signed)
 Patient has called and left voice mail stating that she is scheduled for her injection and to see MD on 08/11/23 and is also scheduled for an injection in her back with the pain clinic in [place] on 08/12/23. Would like to know if any of these injections will interfere with each other? Attempted to contact patient back. No answer

## 2023-08-07 NOTE — Progress Notes (Shared)
 [facility]  [address] [place],  [place]  [address] [phone]  Clinic Day: 08/11/23  Referring physician: Audrea Blender, Renford Cartwright, *  ASSESSMENT & PLAN:  Assessment: Primary malignant neoplasm of breast with metastasis [facility]) Patient has a complicated oncology history history of invasive ductal carcinoma of the right breast diagnosed in 1997.  She was treated with mastectomy and adjuvant chemotherapy.  She also participated in clinical trial using adjuvant high-dose chemotherapy followed by stem cell transplant.  She was treated with adjuvant tamoxifen for 5 years.  She developed biopsy-proven liver metastasis, estrogen and HER2 receptor positive, in November 2007.  She received trastuzumab for an unknown period of time.  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  She was started on zoledronic  acid in July and anastrozole  in August, 2015.    She developed a metastatic lesion of the right anterior chest in August, 2017 confirmed to be adenocarcinoma and treated with excision and radiation.  Anastrozole  was discontinued and she was switched to letrozole .  Palbociclib  was added in January, 2018 after PET imaging revealed 2 new foci at T8 and the manubrium.  The dose of palbociclib  had to be decreased over time.  Zoledronic  acid was discontinued in January, 2019.  Letrozole  was discontinued due to progression and was started on fulvestrant  in February 2019 due to progression. She received palliative radiation to the cervical spine in February, 2019. Palbociclib  was held during radiation and resumed at 75 mg daily once completed. Denosumab  was started in March, 2019.  She has remained on this regimen without evidence of progression.  Most recent imaging was a PET in January, 2025 which was stable, with evidence of treated bone metastases.  Her current schedule is to take the Ibrance  every other day for 28 days and then take 7  days off.  Based on her blood counts, I do not think we can go any higher on the dose.  Severe Osteoarthritis/Degenerative Disc Disease  She has already had knee replacement and right hip replacement. 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   Plan:  She has a cortisone injection scheduled for tomorrow. She had a screening unilateral left breast mammogram done on 05/26/2023 that was clear. She also had a MRI of the cervical spine which revealed anterior cervical fusion at C3-4 and C5-6. At C3-4 there is a severe left facet arthropathy and moderate bilateral foraminal stenosis and at C5-6 there is a moderate left foraminal stenosis. C4-5 there has a mild bilateral foraminal stenosis and mild stenosis and C6-7 has a moderate bilateral foraminal stenosis and mild spinal stenosis. The orthopedic surgeon recommended dexamethasone  to help with her pain. She continues Ibrance  without difficulty but did not start her last course on time due to an low ANC of 900. She has a WBC of 2.5 with an ANC of 1100, low hemoglobin of 11.7 up from 11.6, and platelet count of 196,000. Her CMP is normal and her CA 27.29 is pending. She will receive her Xgeva  and Faslodex  injection today. She will return on June 5th with CBC only to make sure she can safely resume her Ibrance  and I will see her back every 4 weeks with CBC, CMP, and her injections. The patient understands the plans discussed today and is in agreement with them.  She knows to contact our office if she develops concerns prior to her next appointment.  I provided 31 minutes of face-to-face time during this encounter and > 50% was spent counseling as documented under my assessment and plan.   Jade Baumgartner, MD [name] DEPT OF [name] [address] [place] [address] Dept: [phone] Dept Fax: [phone]   No orders  of the defined types were placed in this encounter.   CHIEF COMPLAINT:  CC: Recurrent hormone receptor positive breast cancer   Current Treatment:  Palbociclib /fulvestrant /denosumab    HISTORY OF PRESENT ILLNESS:  Jade Mooney is a 78 y.o. female who we began seeing in January 2023 for transfer of care from Dr. Asencion Blacksmith for the continued treatment and management of metastatic breast cancer. She was originally diagnosed with stage III hormone receptor positive right breast cancer in 1997.  She was treated with right mastectomy and adjuvant chemotherapy with Taxotere for 4 cycles.  She also participated in a [facility] protocol with high-dose chemotherapy, cyclophosphamide/carboplatin/BCNU, followed by stem cell rescue in September 1997. She completed 5 years of tamoxifen in November 2002.  Unfortunately, she developed metastatic liver lesions, biopsy proven metastatic breast cancer, estrogen receptor positive and HER2 receptor positive, in November 2007.  She was treated with trastuzumab for an unknown period of time.    In July 2015, she was found to have incidental findings of mixed lytic and sclerotic bony lesions on CT abdomen and pelvis. She underwent biopsy of the right iliac bone and surgical pathology confirmed invasive ductal carcinoma, grade 2, estrogen and progesterone receptor positive. Bone density from May 2015 was normal. She was started on zolendronic acid every 12 weeks in July 2015. She was also found to have three scalp lesions, biopsied in July 2015 confirming metastatic breast cancer. HER2 negative. She was started on anastrozole  in August 2015.   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     Anastrozole  was discontinued and she was switched to letrozole  in April 2018.  Palbociclib  was added in January 2018 after PET imaging revealed 2 new foci at T8 and the manubrium.    Zoledronic  acid was discontinued in January 2019.  Letrozole  was discontinued in February 2019 due to progression. She was started on fulvestrant  in February 2019. She received palliative radiation to the cervical spine in February 2019. Palbociclib  was held during that time and resumed at 75 mg daily once completed. Denosumab  was started in March 2019.    She has continued on palbociclib  75 mg daily for 3 weeks on and 1 week off, along with fulvestrant  and denosumab  every 4 weeks.  Most recent imaging was a PET scan in December 2023 revealed significant interval change in the diffuse osteoblastic metastatic lesions in the axial and proximal appendicular skeleton without abnormal FDG avidity compatible with chronic treated disease. No evidence of hypermetabolic local recurrent breast cancer or viable distant metastatic disease.    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 Patient states that she feels ok but complains of iliac joint/back pain rating 5/10 and right hip pain. She has a cortisone injection scheduled for tomorrow. She had a screening unilateral left breast mammogram done on 05/26/2023 that was clear. She also had a MRI of the cervical spine which revealed anterior cervical fusion at C3-4 and C5-6. At C3-4 there is a severe left facet arthropathy and moderate bilateral foraminal stenosis and at C5-6 there is a moderate left foraminal  stenosis. C4-5 there has a mild bilateral foraminal stenosis and mild stenosis and C6-7 has a moderate bilateral foraminal stenosis and mild spinal stenosis. The orthopedic surgeon recommended dexamethasone  to help with her pain. She continues Ibrance  without difficulty but did not start her last course on time due to an low ANC of 900. She has a WBC of 2.5 with an ANC of 1100, low hemoglobin of 11.7 up from 11.6, and platelet count of 196,000. Her CMP is normal and her CA 27.29 is pending. She will receive her Xgeva  and Faslodex  injection today. She will return on June 5th with CBC only to make sure she can safely resume her Ibrance .  Her current schedule is to take the Ibrance  every other day for 28 days and then takes 7 days off.  Based on her blood counts, I do not think we can go any higher on the dose.  I will see her back every 4 weeks with CBC, CMP, and her injections.   She denies fever, chills, night sweats, or other signs of infection. She denies cardiorespiratory and gastrointestinal issues. She  denies pain. Her appetite is good and Her weight has decreased 1 pounds over last 3 months. She is accompanied by her husband at today's appointment.   REVIEW OF SYSTEMS:  Review of Systems  Constitutional: Negative.  Negative for appetite change, chills, diaphoresis, fatigue, fever and unexpected weight change.  HENT:  Negative.  Negative for hearing loss, lump/mass, mouth sores, nosebleeds, sore throat, tinnitus, trouble swallowing and voice change.   Eyes: Negative.  Negative for eye problems and icterus.  Respiratory: Negative.  Negative for chest tightness, cough, hemoptysis, shortness of breath and wheezing.   Cardiovascular:  Positive for leg swelling (left foot swelling, night only). Negative for chest pain and palpitations.  Gastrointestinal: Negative.  Negative for abdominal distention, abdominal pain, blood in stool, constipation, diarrhea, nausea, rectal pain and vomiting.  Endocrine:  Negative.   Genitourinary: Negative.  Negative for bladder incontinence, difficulty urinating, dyspareunia, dysuria, frequency, hematuria, menstrual problem, nocturia, pelvic pain, vaginal bleeding and vaginal discharge.   Musculoskeletal:  Positive for arthralgias, back pain (5/10) and myalgias. Negative for flank pain, gait problem, neck pain and neck stiffness.       Iliac joint/back pain rating 5/10 and right hip pain  Skin: Negative.  Negative for itching, rash and wound.  Neurological:  Negative for dizziness, extremity weakness, gait problem, headaches, light-headedness, numbness, seizures and speech difficulty.  Hematological: Negative.  Negative for adenopathy. Does not bruise/bleed easily.  Psychiatric/Behavioral: Negative.  Negative for confusion, decreased concentration, depression, sleep disturbance and suicidal ideas. The patient is not nervous/anxious.     VITALS:  Blood pressure (!) 142/79, pulse 75, temperature (!) 97.5 F (36.4 C), temperature source Oral, resp. rate 16, height 5\' 2"  (1.575 m), weight 179 lb 14.4 oz (81.6 kg), SpO2 96%.  Wt Readings from Last 3 Encounters:  08/11/23  179 lb 14.4 oz (81.6 kg)  06/16/23 179 lb 12 oz (81.5 kg)  05/19/23 180 lb (81.6 kg)    Body mass index is 32.9 kg/m.  Performance status (ECOG): 1 - Symptomatic but completely ambulatory  PHYSICAL EXAM:  Physical Exam Vitals and nursing note reviewed. Exam conducted with a chaperone present.  Constitutional:      General: She is not in acute distress.    Appearance: Normal appearance. She is normal weight. She is not ill-appearing, toxic-appearing or diaphoretic.  HENT:     Head: Normocephalic and atraumatic.     Right Ear: Tympanic membrane, ear canal and external ear normal. There is no impacted cerumen.     Left Ear: Tympanic membrane, ear canal and external ear normal. There is no impacted cerumen.     Nose: Nose normal. No congestion or rhinorrhea.     Mouth/Throat:     Mouth:  Mucous membranes are moist.     Pharynx: Oropharynx is clear. No oropharyngeal exudate or posterior oropharyngeal erythema.  Eyes:     General: No scleral icterus.       Right eye: No discharge.        Left eye: No discharge.     Extraocular Movements: Extraocular movements intact.     Conjunctiva/sclera: Conjunctivae normal.     Pupils: Pupils are equal, round, and reactive to light.  Neck:     Vascular: No carotid bruit.  Cardiovascular:     Rate and Rhythm: Normal rate and regular rhythm.     Pulses: Normal pulses.     Heart sounds: Normal heart sounds. No murmur heard.    No friction rub. No gallop.  Pulmonary:     Effort: Pulmonary effort is normal. No respiratory distress.     Breath sounds: Normal breath sounds. No stridor. No wheezing, rhonchi or rales.  Chest:     Chest wall: No tenderness.     Comments: Right mastectomy is negative Left breast is without masses Abdominal:     General: Bowel sounds are normal. There is no distension.     Palpations: Abdomen is soft. There is no hepatomegaly, splenomegaly or mass.     Tenderness: There is no abdominal tenderness. There is no right CVA tenderness, left CVA tenderness, guarding or rebound.     Hernia: No hernia is present.     Comments: Liver feels normal  Musculoskeletal:        General: No swelling, deformity or signs of injury. Normal range of motion.     Cervical back: Normal range of motion and neck supple. No rigidity or tenderness.     Right lower leg: No edema.     Left lower leg: No edema.     Comments: Persistent induration of the popliteal and upper left calf area  Lymphadenopathy:     Cervical: No cervical adenopathy.     Right cervical: No superficial, deep or posterior cervical adenopathy.    Left cervical: No superficial, deep or posterior cervical adenopathy.     Upper Body:     Right upper body: No supraclavicular, axillary or pectoral adenopathy.     Left upper body: No supraclavicular, axillary or  pectoral adenopathy.  Skin:    General: Skin is warm and dry.     Coloration: Skin is not jaundiced or pale.     Findings: No bruising, erythema, lesion or rash.  Neurological:     General: No focal deficit present.     Mental Status: She is  alert and oriented to person, place, and time. Mental status is at baseline.     Cranial Nerves: No cranial nerve deficit.     Sensory: No sensory deficit.     Motor: No weakness.     Coordination: Coordination normal.     Gait: Gait normal.     Deep Tendon Reflexes: Reflexes normal.  Psychiatric:        Mood and Affect: Mood normal.        Behavior: Behavior normal.        Thought Content: Thought content normal.        Judgment: Judgment normal.    LABS:      Latest Ref Rng & Units 08/11/2023    9:58 AM 07/14/2023   10:05 AM 07/06/2023   10:05 AM  CBC  WBC 4.0 - 10.5 K/uL 2.5  3.9  2.0   Hemoglobin 12.0 - 15.0 g/dL 21.3  08.6  57.8   Hematocrit 36.0 - 46.0 % 34.6  34.7  34.3   Platelets 150 - 400 K/uL 196  241  220       Latest Ref Rng & Units 08/11/2023    9:58 AM 07/14/2023   10:05 AM 07/06/2023   10:05 AM  CMP  Glucose 70 - 99 mg/dL 469  629  528   BUN 8 - 23 mg/dL 15  14  11    Creatinine 0.44 - 1.00 mg/dL 4.13  2.44  0.10   Sodium 135 - 145 mmol/L 137  134  133   Potassium 3.5 - 5.1 mmol/L 4.3  4.2  4.3   Chloride 98 - 111 mmol/L 100  99  99   CO2 22 - 32 mmol/L 23  22  23    Calcium  8.9 - 10.3 mg/dL 27.2  9.2  9.1   Total Protein 6.5 - 8.1 g/dL 6.9  6.9  6.6   Total Bilirubin 0.0 - 1.2 mg/dL 0.3  0.3  0.3   Alkaline Phos 38 - 126 U/L 58  69  60   AST 15 - 41 U/L 24  28  27    ALT 0 - 44 U/L 13  18  19     No results found for: "CEA1", "CEA" / No results found for: "CEA1", "CEA" No results found for: "PSA1" No results found for: "ZDG644" No results found for: "CAN125"  No results found for: "TOTALPROTELP", "ALBUMINELP", "A1GS", "A2GS", "BETS", "BETA2SER", "GAMS", "MSPIKE", "SPEI" No results found for: "TIBC", "FERRITIN",  "IRONPCTSAT" Lab Results  Component Value Date   LDH 138 01/17/2011   LDH 155 12/28/2009   LDH 155 12/29/2008   STUDIES:  EXAM: 05/26/2023 DIGITAL SCREENING UNILATERAL LEFT MAMMOGRAM WITH CAD AND TOMOSYNTHESIS IMPRESSION: No mammographic evidence of malignancy.    HISTORY:   Past Medical History:  Diagnosis Date   Anxiety    Cancer [facility])    Breast 1997 right tx with mastectomy and chemo, metastatic now   GERD (gastroesophageal reflux disease)    Hypercholesterolemia    Hypertension    Hypothyroidism    Osteoarthritis    oa   Personal history of chemotherapy    Personal history of radiation therapy    Secondary malignant neoplasm of bone and bone marrow (HCC)    TIA (transient ischemic attack) last 09/10/20   x 3 total, they seem to occur every 5 years    Past Surgical History:  Procedure Laterality Date   CATARACT EXTRACTION, BILATERAL     CERVICAL FUSION  2020   MASTECTOMY  MODIFIED RADICAL Right 1997   porta cath insertion  1997   later removed   radiation tx  finished 02-25-16   x 20 tx   TONSILLECTOMY     TOTAL HIP ARTHROPLASTY Left 03/12/2016   Procedure: LEFT TOTAL HIP ARTHROPLASTY ANTERIOR APPROACH;  Surgeon: Liliane Rei, MD;  Location: [facility];  Service: Orthopedics;  Laterality: Left;   TOTAL HIP ARTHROPLASTY Right 04/13/2023   Procedure: TOTAL HIP ARTHROPLASTY ANTERIOR APPROACH;  Surgeon: Liliane Rei, MD;  Location: [facility];  Service: Orthopedics;  Laterality: Right;   TOTAL KNEE ARTHROPLASTY Left    TOTAL KNEE ARTHROPLASTY Right 10/30/2021   Procedure: TOTAL KNEE ARTHROPLASTY;  Surgeon: Genevie Kerns, MD;  Location: [facility];  Service: Orthopedics;  Laterality: Right;  adductor canal 120   TUBAL LIGATION      Family History  Problem Relation Age of Onset   Breast cancer Maternal Aunt     Social History:  reports that she has never smoked. She has never used smokeless tobacco. She reports current alcohol use of about 1.0 standard drink of alcohol per  week. She reports that she does not use drugs.The patient is accompanied by her husband today.  Allergies:  Allergies  Allergen Reactions   Oxycodone-Aspirin  Nausea And Vomiting and Other (See Comments)   Erythromycin Nausea Only   Oxycodone Other (See Comments)    oxycodone   Oxycodone Hcl Nausea And Vomiting   Tramadol  Itching   Amoxicillin Hives    Has patient had a PCN reaction causing immediate rash, facial/tongue/throat swelling, SOB or lightheadedness with hypotension:unsure  Has patient had a PCN reaction causing severe rash involving mucus membranes or skin necrosis:No  Has patient had a PCN reaction that required hospitalization:No  Has patient had a PCN reaction occurring within the last 10 years: Yes  If all of the above answers are "NO", then may proceed with Cephalosporin use.  Has patient had a PCN reaction causing immediate rash, facial/tongue/throat swelling, SOB or lightheadedness with hypotension:unsure, Has patient had a PCN reaction causing severe rash involving mucus membranes or skin necrosis:No, Has patient had a PCN reaction that required hospitalization:No, Has patient had a PCN reaction occurring within the last 10 years: Yes, If all of the above answers are "NO", then may proceed with Cephalosporin use.    Current Medications: Current Outpatient Medications  Medication Sig Dispense Refill   acetaminophen  (TYLENOL ) 500 MG tablet Take 1,000 mg by mouth every 6 (six) hours as needed for mild pain.     acidophilus (RISAQUAD) CAPS capsule Take 1 capsule by mouth daily.     ascorbic acid (VITAMIN C) 1000 MG tablet Take 1,000 mg by mouth daily.     aspirin  325 MG tablet Take 325 mg by mouth daily.     beta carotene 15 MG capsule Take 15 mg by mouth daily.     Biotin 5000 MCG TABS Take 5,000 mcg by mouth daily.     CALCIUM  CITRATE-VITAMIN D PO Take 1 tablet by mouth in the morning, at noon, in the evening, and at bedtime.     clopidogrel  (PLAVIX ) 75 MG tablet  Take 1 tablet (75 mg total) by mouth daily.     cyanocobalamin  (,VITAMIN B-12,) 1000 MCG/ML injection Inject 1,000 mcg into the muscle every 30 (thirty) days.     Denosumab  (XGEVA  [place]) Inject 1 Dose into the skin every 30 (thirty) days.     famotidine  (PEPCID ) 40 MG tablet Take 40 mg by mouth daily.  glucosamine-chondroitin 500-400 MG tablet Take 1 tablet by mouth daily.     ketotifen (ZADITOR) 0.025 % ophthalmic solution Place 1 drop into both eyes daily as needed (allergies).     levothyroxine  (SYNTHROID , LEVOTHROID) 88 MCG tablet Take 88 mcg by mouth daily before breakfast.     LORazepam  (ATIVAN ) 1 MG tablet Take 1 tablet (1 mg total) by mouth every 8 (eight) hours as needed for anxiety. 30 tablet 0   losartan  (COZAAR ) 25 MG tablet Take 25 mg by mouth daily.     Lutein 20 MG CAPS Take 20 mg by mouth daily.     meclizine  (ANTIVERT ) 25 MG tablet Take 25 mg by mouth 3 (three) times daily as needed for dizziness.     melatonin 5 MG TABS Take 5 mg by mouth at bedtime as needed (sleep).     metroNIDAZOLE  (METROCREAM ) 0.75 % cream Apply 1 Application topically daily.     Multiple Vitamins-Minerals (MULTIVITAMIN WITH MINERALS) tablet Take 1 tablet by mouth daily.     Omega-3 1000 MG CAPS Take 1,000 mg by mouth in the morning and at bedtime.     palbociclib  (IBRANCE ) 75 MG tablet Take 1 tablet (75 mg total) by mouth daily. Take as directed by MD for 21 days on, 7 days off, repeat every 28 days. (Patient taking differently: Take 75 mg by mouth every other day. Take as directed by MD for 21 days on, 7 days off, repeat every 28 days.) 21 tablet 5   pantoprazole  (PROTONIX ) 40 MG tablet Take 40 mg by mouth daily.     Polyethyl Glycol-Propyl Glycol 0.4-0.3 % SOLN Place 1-2 drops into both eyes 2 (two) times daily.     rOPINIRole  (REQUIP ) 2 MG tablet Take 4 mg by mouth at bedtime.     rosuvastatin  (CRESTOR ) 20 MG tablet Take 1 tablet (20 mg total) by mouth daily.     vitamin E 180 MG (400 UNITS) capsule  Take 400 Units by mouth daily.     zinc gluconate 50 MG tablet Take 50 mg by mouth daily.     No current facility-administered medications for this visit.    I,Jasmine M Lassiter,acting as a scribe for Jade Baumgartner, MD.,have documented all relevant documentation on the behalf of Jade Baumgartner, MD,as directed by  Jade Baumgartner, MD while in the presence of Jade Baumgartner, MD.

## 2023-08-10 DIAGNOSIS — E538 Deficiency of other specified B group vitamins: Secondary | ICD-10-CM | POA: Diagnosis not present

## 2023-08-11 ENCOUNTER — Inpatient Hospital Stay (HOSPITAL_BASED_OUTPATIENT_CLINIC_OR_DEPARTMENT_OTHER): Admitting: Oncology

## 2023-08-11 ENCOUNTER — Other Ambulatory Visit: Payer: Self-pay | Admitting: Oncology

## 2023-08-11 ENCOUNTER — Inpatient Hospital Stay: Attending: Oncology

## 2023-08-11 ENCOUNTER — Inpatient Hospital Stay

## 2023-08-11 ENCOUNTER — Telehealth: Payer: Self-pay | Admitting: Oncology

## 2023-08-11 ENCOUNTER — Encounter: Payer: Self-pay | Admitting: Oncology

## 2023-08-11 VITALS — BP 142/79 | HR 75 | Temp 97.5°F | Resp 16 | Ht 62.0 in | Wt 179.9 lb

## 2023-08-11 DIAGNOSIS — Z1721 Progesterone receptor positive status: Secondary | ICD-10-CM | POA: Diagnosis not present

## 2023-08-11 DIAGNOSIS — Z79899 Other long term (current) drug therapy: Secondary | ICD-10-CM | POA: Insufficient documentation

## 2023-08-11 DIAGNOSIS — C50919 Malignant neoplasm of unspecified site of unspecified female breast: Secondary | ICD-10-CM | POA: Diagnosis not present

## 2023-08-11 DIAGNOSIS — C50912 Malignant neoplasm of unspecified site of left female breast: Secondary | ICD-10-CM

## 2023-08-11 DIAGNOSIS — Z170 Estrogen receptor positive status [ER+]: Secondary | ICD-10-CM | POA: Diagnosis not present

## 2023-08-11 DIAGNOSIS — C787 Secondary malignant neoplasm of liver and intrahepatic bile duct: Secondary | ICD-10-CM | POA: Diagnosis not present

## 2023-08-11 DIAGNOSIS — C7951 Secondary malignant neoplasm of bone: Secondary | ICD-10-CM | POA: Diagnosis not present

## 2023-08-11 DIAGNOSIS — Z1732 Human epidermal growth factor receptor 2 negative status: Secondary | ICD-10-CM | POA: Diagnosis not present

## 2023-08-11 DIAGNOSIS — C801 Malignant (primary) neoplasm, unspecified: Secondary | ICD-10-CM

## 2023-08-11 DIAGNOSIS — Z5111 Encounter for antineoplastic chemotherapy: Secondary | ICD-10-CM | POA: Insufficient documentation

## 2023-08-11 DIAGNOSIS — C792 Secondary malignant neoplasm of skin: Secondary | ICD-10-CM | POA: Diagnosis not present

## 2023-08-11 DIAGNOSIS — D702 Other drug-induced agranulocytosis: Secondary | ICD-10-CM

## 2023-08-11 LAB — CBC WITH DIFFERENTIAL (CANCER CENTER ONLY)
Abs Immature Granulocytes: 0.01 10*3/uL (ref 0.00–0.07)
Basophils Absolute: 0.1 10*3/uL (ref 0.0–0.1)
Basophils Relative: 2 %
Eosinophils Absolute: 0.1 10*3/uL (ref 0.0–0.5)
Eosinophils Relative: 4 %
HCT: 34.6 % — ABNORMAL LOW (ref 36.0–46.0)
Hemoglobin: 11.7 g/dL — ABNORMAL LOW (ref 12.0–15.0)
Immature Granulocytes: 0 %
Lymphocytes Relative: 38 %
Lymphs Abs: 0.9 10*3/uL (ref 0.7–4.0)
MCH: 29.5 pg (ref 26.0–34.0)
MCHC: 33.8 g/dL (ref 30.0–36.0)
MCV: 87.4 fL (ref 80.0–100.0)
Monocytes Absolute: 0.3 10*3/uL (ref 0.1–1.0)
Monocytes Relative: 12 %
Neutro Abs: 1.1 10*3/uL — ABNORMAL LOW (ref 1.7–7.7)
Neutrophils Relative %: 44 %
Platelet Count: 196 10*3/uL (ref 150–400)
RBC: 3.96 MIL/uL (ref 3.87–5.11)
RDW: 17.2 % — ABNORMAL HIGH (ref 11.5–15.5)
WBC Count: 2.5 10*3/uL — ABNORMAL LOW (ref 4.0–10.5)
nRBC: 0 % (ref 0.0–0.2)

## 2023-08-11 LAB — CMP (CANCER CENTER ONLY)
ALT: 13 U/L (ref 0–44)
AST: 24 U/L (ref 15–41)
Albumin: 4.4 g/dL (ref 3.5–5.0)
Alkaline Phosphatase: 58 U/L (ref 38–126)
Anion gap: 14 (ref 5–15)
BUN: 15 mg/dL (ref 8–23)
CO2: 23 mmol/L (ref 22–32)
Calcium: 10.1 mg/dL (ref 8.9–10.3)
Chloride: 100 mmol/L (ref 98–111)
Creatinine: 1 mg/dL (ref 0.44–1.00)
GFR, Estimated: 58 mL/min — ABNORMAL LOW (ref >60)
Glucose, Bld: 116 mg/dL — ABNORMAL HIGH (ref 70–99)
Potassium: 4.3 mmol/L (ref 3.5–5.1)
Sodium: 137 mmol/L (ref 135–145)
Total Bilirubin: 0.3 mg/dL (ref 0.0–1.2)
Total Protein: 6.9 g/dL (ref 6.5–8.1)

## 2023-08-11 MED ORDER — DENOSUMAB 120 MG/1.7ML ~~LOC~~ SOLN
120.0000 mg | Freq: Once | SUBCUTANEOUS | Status: AC
  Administered 2023-08-11: 120 mg via SUBCUTANEOUS
  Filled 2023-08-11: qty 1.7

## 2023-08-11 MED ORDER — FULVESTRANT 250 MG/5ML IM SOSY
500.0000 mg | PREFILLED_SYRINGE | Freq: Once | INTRAMUSCULAR | Status: AC
  Administered 2023-08-11: 500 mg via INTRAMUSCULAR
  Filled 2023-08-11: qty 10

## 2023-08-11 MED ORDER — LORAZEPAM 1 MG PO TABS: 1.0000 mg | ORAL_TABLET | Freq: Three times a day (TID) | ORAL | 0 refills | Status: AC | PRN

## 2023-08-11 NOTE — Patient Instructions (Signed)
 Fulvestrant Injection What is this medication? FULVESTRANT (ful VES trant) treats breast cancer. It works by blocking the hormone estrogen in breast tissue, which prevents breast cancer cells from spreading or growing. This medicine may be used for other purposes; ask your health care provider or pharmacist if you have questions. COMMON BRAND NAME(S): FASLODEX What should I tell my care team before I take this medication? They need to know if you have any of these conditions: Bleeding disorder Liver disease Low blood cell levels (white cells, red cells, and platelets) An unusual or allergic reaction to fulvestrant, other medications, foods, dyes, or preservatives Pregnant or trying to get pregnant Breastfeeding How should I use this medication? This medication is injected into a muscle. It is given by your care team in a hospital or clinic setting. Talk to your care team about the use of this medication in children. Special care may be needed. Overdosage: If you think you have taken too much of this medicine contact a poison control center or emergency room at once. NOTE: This medicine is only for you. Do not share this medicine with others. What if I miss a dose? Keep appointments for follow-up doses. It is important not to miss your dose. Call your care team if you are unable to keep an appointment. What may interact with this medication? Fluoroestradiol F18 This list may not describe all possible interactions. Give your health care provider a list of all the medicines, herbs, non-prescription drugs, or dietary supplements you use. Also tell them if you smoke, drink alcohol, or use illegal drugs. Some items may interact with your medicine. What should I watch for while using this medication? Your condition will be monitored carefully while you are receiving this medication. You may need blood work done while you are taking this medication. This medication is injected into a muscle. Talk  to your care team if you also take medications that prevent or treat blood clots, such as warfarin. Blood thinners may increase the risk of bleeding or bruising in the muscle where this medication is injected. The benefits of this medication may outweigh the risks. Your care team can help you find the option that works for you. They can also help limit the risk of bleeding. Talk to your care team if you may be pregnant. Serious birth defects can occur if you take this medication during pregnancy and for 1 year after the last dose. You will need a negative pregnancy test before starting this medication. Contraception is recommended while taking this medication and for 1 year after the last dose. Your care team can help you find the option that works for you. Do not breastfeed while taking this medication and for 1 year after the last dose. This medication may cause infertility. Talk to your care team if you are concerned about your fertility. What side effects may I notice from receiving this medication? Side effects that you should report to your care team as soon as possible: Allergic reactions or angioedema--skin rash, itching or hives, swelling of the face, eyes, lips, tongue, arms, or legs, trouble swallowing or breathing Pain, tingling, or numbness in the hands or feet Side effects that usually do not require medical attention (report to your care team if they continue or are bothersome): Bone, joint, or muscle pain Constipation Headache Hot flashes Nausea Pain, redness, or irritation at injection site Unusual weakness or fatigue This list may not describe all possible side effects. Call your doctor for medical advice about side  effects. You may report side effects to FDA at 1-800-FDA-1088. Where should I keep my medication? This medication is given in a hospital or clinic. It will not be stored at home. NOTE: This sheet is a summary. It may not cover all possible information. If you have  questions about this medicine, talk to your doctor, pharmacist, or health care provider.  2024 [employer] Standard (2022-11-14 00:00:00)Denosumab Injection (Oncology) What is this medication? DENOSUMAB (den oh SUE mab) prevents weakened bones caused by cancer. It may also be used to treat noncancerous bone tumors that cannot be removed by surgery. It can also be used to treat high calcium levels in the blood caused by cancer. It works by blocking a protein that causes bones to break down quickly. This slows down the release of calcium from bones, which lowers calcium levels in your blood. It also makes your bones stronger and less likely to break (fracture). This medicine may be used for other purposes; ask your health care provider or pharmacist if you have questions. COMMON BRAND NAME(S): XGEVA What should I tell my care team before I take this medication? They need to know if you have any of these conditions: Dental disease Having surgery or tooth extraction Infection Kidney disease Low levels of calcium or vitamin D in the blood Malnutrition On hemodialysis Skin conditions or sensitivity Thyroid or parathyroid disease An unusual reaction to denosumab, other medications, foods, dyes, or preservatives Pregnant or trying to get pregnant Breast-feeding How should I use this medication? This medication is for injection under the skin. It is given by your care team in a hospital or clinic setting. A special MedGuide will be given to you before each treatment. Be sure to read this information carefully each time. Talk to your care team about the use of this medication in children. While it may be prescribed for children as young as 13 years for selected conditions, precautions do apply. Overdosage: If you think you have taken too much of this medicine contact a poison control center or emergency room at once. NOTE: This medicine is only for you. Do not share this medicine with others. What  if I miss a dose? Keep appointments for follow-up doses. It is important not to miss your dose. Call your care team if you are unable to keep an appointment. What may interact with this medication? Do not take this medication with any of the following: Other medications containing denosumab This medication may also interact with the following: Medications that lower your chance of fighting infection Steroid medications, such as prednisone or cortisone This list may not describe all possible interactions. Give your health care provider a list of all the medicines, herbs, non-prescription drugs, or dietary supplements you use. Also tell them if you smoke, drink alcohol, or use illegal drugs. Some items may interact with your medicine. What should I watch for while using this medication? Your condition will be monitored carefully while you are receiving this medication. You may need blood work while taking this medication. This medication may increase your risk of getting an infection. Call your care team for advice if you get a fever, chills, sore throat, or other symptoms of a cold or flu. Do not treat yourself. Try to avoid being around people who are sick. You should make sure you get enough calcium and vitamin D while you are taking this medication, unless your care team tells you not to. Discuss the foods you eat and the vitamins you take with your  care team. Some people who take this medication have severe bone, joint, or muscle pain. This medication may also increase your risk for jaw problems or a broken thigh bone. Tell your care team right away if you have severe pain in your jaw, bones, joints, or muscles. Tell your care team if you have any pain that does not go away or that gets worse. Talk to your care team if you may be pregnant. Serious birth defects can occur if you take this medication during pregnancy and for 5 months after the last dose. You will need a negative pregnancy test before  starting this medication. Contraception is recommended while taking this medication and for 5 months after the last dose. Your care team can help you find the option that works for you. What side effects may I notice from receiving this medication? Side effects that you should report to your care team as soon as possible: Allergic reactions--skin rash, itching, hives, swelling of the face, lips, tongue, or throat Bone, joint, or muscle pain Low calcium level--muscle pain or cramps, confusion, tingling, or numbness in the hands or feet Osteonecrosis of the jaw--pain, swelling, or redness in the mouth, numbness of the jaw, poor healing after dental work, unusual discharge from the mouth, visible bones in the mouth Side effects that usually do not require medical attention (report to your care team if they continue or are bothersome): Cough Diarrhea Fatigue Headache Nausea This list may not describe all possible side effects. Call your doctor for medical advice about side effects. You may report side effects to FDA at 1-800-FDA-1088. Where should I keep my medication? This medication is given in a hospital or clinic. It will not be stored at home. NOTE: This sheet is a summary. It may not cover all possible information. If you have questions about this medicine, talk to your doctor, pharmacist, or health care provider.  2024 [employer] Standard (2021-07-31 00:00:00)

## 2023-08-11 NOTE — Telephone Encounter (Signed)
 Patient has been scheduled for follow-up visit per 08/10/23 LOS.  Pt given an appt calendar with date and time.

## 2023-08-12 DIAGNOSIS — M533 Sacrococcygeal disorders, not elsewhere classified: Secondary | ICD-10-CM | POA: Diagnosis not present

## 2023-08-12 LAB — CANCER ANTIGEN 27.29: CA 27.29: 42.9 U/mL — ABNORMAL HIGH (ref 0.0–38.6)

## 2023-08-18 ENCOUNTER — Telehealth: Payer: Self-pay

## 2023-08-18 ENCOUNTER — Other Ambulatory Visit: Payer: Self-pay

## 2023-08-18 ENCOUNTER — Other Ambulatory Visit (HOSPITAL_COMMUNITY): Payer: Self-pay

## 2023-08-18 ENCOUNTER — Encounter: Payer: Self-pay | Admitting: Oncology

## 2023-08-18 NOTE — Telephone Encounter (Signed)
-----   Message from [name] sent at 08/18/2023 12:35 PM EDT ----- Regarding: call Her CA 27.29 went up again this time to 42.9.  It has been going up and down and so I do not recommend any change in her therapy.  We will continue to monitor

## 2023-08-18 NOTE — Telephone Encounter (Signed)
Called patient and notified her of the message

## 2023-08-18 NOTE — Progress Notes (Signed)
 Specialty Pharmacy Ongoing Clinical Assessment Note  Jade Mooney is a 78 y.o. female who is being followed by the specialty pharmacy service for RxSp Oncology   Patient's specialty medication(s) reviewed today: Palbociclib  (IBRANCE )   Missed doses in the last 4 weeks: 0   Patient/Caregiver did not have any additional questions or concerns.   Therapeutic benefit summary: Patient is achieving benefit   Adverse events/side effects summary: Experienced adverse events/side effects (neutropenia, provider reduced to every other day dosing and is closely monitoring)   Patient's therapy is appropriate to: Continue    Goals Addressed             This Visit's Progress    Slow Disease Progression   On track    Patient is on track. Patient will maintain adherence.  CA 27.29 continues to fluctuate and was last up at 42.9 U/ML on 08/11/23, Dr. Almer Jacobson is closely monitoring.         Follow up: 6 months  Malachi Screws Specialty Pharmacist

## 2023-08-18 NOTE — Progress Notes (Signed)
 Specialty Pharmacy Refill Coordination Note  Jade Mooney is a 78 y.o. female contacted today regarding refills of specialty medication(s) Palbociclib  (IBRANCE )   Patient requested Delivery   Delivery date: 08/27/23   Verified address: [address]  [place] [address]   Medication will be filled on 08/26/23.

## 2023-08-27 ENCOUNTER — Inpatient Hospital Stay: Attending: Oncology

## 2023-08-27 DIAGNOSIS — D702 Other drug-induced agranulocytosis: Secondary | ICD-10-CM

## 2023-08-27 DIAGNOSIS — C50919 Malignant neoplasm of unspecified site of unspecified female breast: Secondary | ICD-10-CM | POA: Diagnosis present

## 2023-08-27 DIAGNOSIS — M533 Sacrococcygeal disorders, not elsewhere classified: Secondary | ICD-10-CM | POA: Diagnosis not present

## 2023-08-27 DIAGNOSIS — Z5111 Encounter for antineoplastic chemotherapy: Secondary | ICD-10-CM | POA: Insufficient documentation

## 2023-08-27 DIAGNOSIS — Z1721 Progesterone receptor positive status: Secondary | ICD-10-CM | POA: Insufficient documentation

## 2023-08-27 DIAGNOSIS — Z170 Estrogen receptor positive status [ER+]: Secondary | ICD-10-CM | POA: Insufficient documentation

## 2023-08-27 DIAGNOSIS — C7951 Secondary malignant neoplasm of bone: Secondary | ICD-10-CM | POA: Insufficient documentation

## 2023-08-27 DIAGNOSIS — M47816 Spondylosis without myelopathy or radiculopathy, lumbar region: Secondary | ICD-10-CM | POA: Diagnosis not present

## 2023-08-27 DIAGNOSIS — C787 Secondary malignant neoplasm of liver and intrahepatic bile duct: Secondary | ICD-10-CM | POA: Diagnosis not present

## 2023-08-27 DIAGNOSIS — Z85830 Personal history of malignant neoplasm of bone: Secondary | ICD-10-CM | POA: Diagnosis not present

## 2023-08-27 LAB — CBC WITH DIFFERENTIAL (CANCER CENTER ONLY)
Abs Immature Granulocytes: 0.01 10*3/uL (ref 0.00–0.07)
Basophils Absolute: 0 10*3/uL (ref 0.0–0.1)
Basophils Relative: 2 %
Eosinophils Absolute: 0.1 10*3/uL (ref 0.0–0.5)
Eosinophils Relative: 2 %
HCT: 35.4 % — ABNORMAL LOW (ref 36.0–46.0)
Hemoglobin: 11.8 g/dL — ABNORMAL LOW (ref 12.0–15.0)
Immature Granulocytes: 0 %
Lymphocytes Relative: 29 %
Lymphs Abs: 0.8 10*3/uL (ref 0.7–4.0)
MCH: 29.6 pg (ref 26.0–34.0)
MCHC: 33.3 g/dL (ref 30.0–36.0)
MCV: 88.9 fL (ref 80.0–100.0)
Monocytes Absolute: 0.3 10*3/uL (ref 0.1–1.0)
Monocytes Relative: 10 %
Neutro Abs: 1.5 10*3/uL — ABNORMAL LOW (ref 1.7–7.7)
Neutrophils Relative %: 57 %
Platelet Count: 211 10*3/uL (ref 150–400)
RBC: 3.98 MIL/uL (ref 3.87–5.11)
RDW: 16.9 % — ABNORMAL HIGH (ref 11.5–15.5)
WBC Count: 2.6 10*3/uL — ABNORMAL LOW (ref 4.0–10.5)
nRBC: 0 % (ref 0.0–0.2)

## 2023-09-08 ENCOUNTER — Inpatient Hospital Stay

## 2023-09-08 VITALS — BP 159/82 | HR 66 | Temp 98.2°F | Resp 18 | Ht 62.0 in

## 2023-09-08 DIAGNOSIS — C50919 Malignant neoplasm of unspecified site of unspecified female breast: Secondary | ICD-10-CM

## 2023-09-08 DIAGNOSIS — C50912 Malignant neoplasm of unspecified site of left female breast: Secondary | ICD-10-CM

## 2023-09-08 DIAGNOSIS — Z5111 Encounter for antineoplastic chemotherapy: Secondary | ICD-10-CM | POA: Diagnosis not present

## 2023-09-08 DIAGNOSIS — C7951 Secondary malignant neoplasm of bone: Secondary | ICD-10-CM | POA: Diagnosis not present

## 2023-09-08 DIAGNOSIS — Z1721 Progesterone receptor positive status: Secondary | ICD-10-CM | POA: Diagnosis not present

## 2023-09-08 DIAGNOSIS — C787 Secondary malignant neoplasm of liver and intrahepatic bile duct: Secondary | ICD-10-CM | POA: Diagnosis not present

## 2023-09-08 DIAGNOSIS — Z170 Estrogen receptor positive status [ER+]: Secondary | ICD-10-CM | POA: Diagnosis not present

## 2023-09-08 LAB — CBC WITH DIFFERENTIAL (CANCER CENTER ONLY)
Abs Immature Granulocytes: 0.01 K/uL (ref 0.00–0.07)
Basophils Absolute: 0 K/uL (ref 0.0–0.1)
Basophils Relative: 1 %
Eosinophils Absolute: 0.1 K/uL (ref 0.0–0.5)
Eosinophils Relative: 2 %
HCT: 33.8 % — ABNORMAL LOW (ref 36.0–46.0)
Hemoglobin: 11.3 g/dL — ABNORMAL LOW (ref 12.0–15.0)
Immature Granulocytes: 0 %
Lymphocytes Relative: 26 %
Lymphs Abs: 1 K/uL (ref 0.7–4.0)
MCH: 29.3 pg (ref 26.0–34.0)
MCHC: 33.4 g/dL (ref 30.0–36.0)
MCV: 87.6 fL (ref 80.0–100.0)
Monocytes Absolute: 0.6 K/uL (ref 0.1–1.0)
Monocytes Relative: 16 %
Neutro Abs: 2.1 K/uL (ref 1.7–7.7)
Neutrophils Relative %: 55 %
Platelet Count: 224 K/uL (ref 150–400)
RBC: 3.86 MIL/uL — ABNORMAL LOW (ref 3.87–5.11)
RDW: 16.3 % — ABNORMAL HIGH (ref 11.5–15.5)
WBC Count: 3.8 K/uL — ABNORMAL LOW (ref 4.0–10.5)
nRBC: 0 % (ref 0.0–0.2)

## 2023-09-08 LAB — CMP (CANCER CENTER ONLY)
ALT: 13 U/L (ref 0–44)
AST: 21 U/L (ref 15–41)
Albumin: 4.5 g/dL (ref 3.5–5.0)
Alkaline Phosphatase: 64 U/L (ref 38–126)
Anion gap: 11 (ref 5–15)
BUN: 16 mg/dL (ref 8–23)
CO2: 23 mmol/L (ref 22–32)
Calcium: 10.4 mg/dL — ABNORMAL HIGH (ref 8.9–10.3)
Chloride: 98 mmol/L (ref 98–111)
Creatinine: 0.88 mg/dL (ref 0.44–1.00)
GFR, Estimated: >60 mL/min
Glucose, Bld: 101 mg/dL — ABNORMAL HIGH (ref 70–99)
Potassium: 4.1 mmol/L (ref 3.5–5.1)
Sodium: 132 mmol/L — ABNORMAL LOW (ref 135–145)
Total Bilirubin: 0.5 mg/dL (ref 0.0–1.2)
Total Protein: 6.7 g/dL (ref 6.5–8.1)

## 2023-09-08 MED ORDER — DENOSUMAB 120 MG/1.7ML ~~LOC~~ SOLN
120.0000 mg | Freq: Once | SUBCUTANEOUS | Status: AC
  Administered 2023-09-08: 120 mg via SUBCUTANEOUS
  Filled 2023-09-08: qty 1.7

## 2023-09-08 MED ORDER — FULVESTRANT 250 MG/5ML IM SOSY
500.0000 mg | PREFILLED_SYRINGE | Freq: Once | INTRAMUSCULAR | Status: AC
  Administered 2023-09-08: 500 mg via INTRAMUSCULAR
  Filled 2023-09-08: qty 10

## 2023-09-09 LAB — CANCER ANTIGEN 27.29: CA 27.29: 47.4 U/mL — ABNORMAL HIGH (ref 0.0–38.6)

## 2023-09-11 ENCOUNTER — Encounter: Payer: Self-pay | Admitting: Oncology

## 2023-09-11 DIAGNOSIS — E538 Deficiency of other specified B group vitamins: Secondary | ICD-10-CM | POA: Diagnosis not present

## 2023-09-14 DIAGNOSIS — M47816 Spondylosis without myelopathy or radiculopathy, lumbar region: Secondary | ICD-10-CM | POA: Diagnosis not present

## 2023-09-14 DIAGNOSIS — Z8589 Personal history of malignant neoplasm of other organs and systems: Secondary | ICD-10-CM | POA: Diagnosis not present

## 2023-09-14 DIAGNOSIS — M5459 Other low back pain: Secondary | ICD-10-CM | POA: Diagnosis not present

## 2023-09-17 DIAGNOSIS — M47816 Spondylosis without myelopathy or radiculopathy, lumbar region: Secondary | ICD-10-CM | POA: Diagnosis not present

## 2023-09-17 DIAGNOSIS — M5459 Other low back pain: Secondary | ICD-10-CM | POA: Diagnosis not present

## 2023-09-17 DIAGNOSIS — Z8589 Personal history of malignant neoplasm of other organs and systems: Secondary | ICD-10-CM | POA: Diagnosis not present

## 2023-09-17 DIAGNOSIS — L820 Inflamed seborrheic keratosis: Secondary | ICD-10-CM | POA: Diagnosis not present

## 2023-09-17 DIAGNOSIS — D2239 Melanocytic nevi of other parts of face: Secondary | ICD-10-CM | POA: Diagnosis not present

## 2023-09-17 DIAGNOSIS — D225 Melanocytic nevi of trunk: Secondary | ICD-10-CM | POA: Diagnosis not present

## 2023-09-21 DIAGNOSIS — M5459 Other low back pain: Secondary | ICD-10-CM | POA: Diagnosis not present

## 2023-09-21 DIAGNOSIS — Z8589 Personal history of malignant neoplasm of other organs and systems: Secondary | ICD-10-CM | POA: Diagnosis not present

## 2023-09-21 DIAGNOSIS — M47816 Spondylosis without myelopathy or radiculopathy, lumbar region: Secondary | ICD-10-CM | POA: Diagnosis not present

## 2023-09-23 DIAGNOSIS — M5459 Other low back pain: Secondary | ICD-10-CM | POA: Diagnosis not present

## 2023-09-23 DIAGNOSIS — M47816 Spondylosis without myelopathy or radiculopathy, lumbar region: Secondary | ICD-10-CM | POA: Diagnosis not present

## 2023-09-23 DIAGNOSIS — Z8589 Personal history of malignant neoplasm of other organs and systems: Secondary | ICD-10-CM | POA: Diagnosis not present

## 2023-09-28 DIAGNOSIS — M5459 Other low back pain: Secondary | ICD-10-CM | POA: Diagnosis not present

## 2023-09-28 DIAGNOSIS — Z8589 Personal history of malignant neoplasm of other organs and systems: Secondary | ICD-10-CM | POA: Diagnosis not present

## 2023-09-28 DIAGNOSIS — M47816 Spondylosis without myelopathy or radiculopathy, lumbar region: Secondary | ICD-10-CM | POA: Diagnosis not present

## 2023-09-30 DIAGNOSIS — M5459 Other low back pain: Secondary | ICD-10-CM | POA: Diagnosis not present

## 2023-09-30 DIAGNOSIS — Z8589 Personal history of malignant neoplasm of other organs and systems: Secondary | ICD-10-CM | POA: Diagnosis not present

## 2023-09-30 DIAGNOSIS — M47816 Spondylosis without myelopathy or radiculopathy, lumbar region: Secondary | ICD-10-CM | POA: Diagnosis not present

## 2023-10-05 ENCOUNTER — Encounter (INDEPENDENT_AMBULATORY_CARE_PROVIDER_SITE_OTHER): Payer: Self-pay

## 2023-10-05 ENCOUNTER — Other Ambulatory Visit: Payer: Self-pay

## 2023-10-05 ENCOUNTER — Other Ambulatory Visit (HOSPITAL_COMMUNITY): Payer: Self-pay

## 2023-10-05 DIAGNOSIS — M5459 Other low back pain: Secondary | ICD-10-CM | POA: Diagnosis not present

## 2023-10-05 DIAGNOSIS — M47816 Spondylosis without myelopathy or radiculopathy, lumbar region: Secondary | ICD-10-CM | POA: Diagnosis not present

## 2023-10-05 DIAGNOSIS — Z8589 Personal history of malignant neoplasm of other organs and systems: Secondary | ICD-10-CM | POA: Diagnosis not present

## 2023-10-05 NOTE — Progress Notes (Signed)
 Specialty Pharmacy Refill Coordination Note  MyChart Questionnaire Submission  Jade Mooney is a 78 y.o. female contacted today regarding refills of specialty medication(s) No data recorded  Next cycle approx: 10/15/23   Patient requested: (Patient-Rptd) Delivery   Delivery date: 10/07/23   Verified address: (Patient-Rptd) [address], [place], [place]  Medication will be filled on 10/06/23.

## 2023-10-06 ENCOUNTER — Inpatient Hospital Stay: Attending: Oncology

## 2023-10-06 ENCOUNTER — Inpatient Hospital Stay

## 2023-10-06 VITALS — BP 157/83 | HR 60 | Temp 98.1°F | Resp 18 | Ht 62.0 in

## 2023-10-06 DIAGNOSIS — C50919 Malignant neoplasm of unspecified site of unspecified female breast: Secondary | ICD-10-CM | POA: Diagnosis present

## 2023-10-06 DIAGNOSIS — Z1721 Progesterone receptor positive status: Secondary | ICD-10-CM | POA: Diagnosis not present

## 2023-10-06 DIAGNOSIS — Z1732 Human epidermal growth factor receptor 2 negative status: Secondary | ICD-10-CM | POA: Diagnosis not present

## 2023-10-06 DIAGNOSIS — C50912 Malignant neoplasm of unspecified site of left female breast: Secondary | ICD-10-CM

## 2023-10-06 DIAGNOSIS — C787 Secondary malignant neoplasm of liver and intrahepatic bile duct: Secondary | ICD-10-CM | POA: Diagnosis not present

## 2023-10-06 DIAGNOSIS — Z170 Estrogen receptor positive status [ER+]: Secondary | ICD-10-CM | POA: Insufficient documentation

## 2023-10-06 DIAGNOSIS — Z5111 Encounter for antineoplastic chemotherapy: Secondary | ICD-10-CM | POA: Diagnosis not present

## 2023-10-06 DIAGNOSIS — C7951 Secondary malignant neoplasm of bone: Secondary | ICD-10-CM | POA: Insufficient documentation

## 2023-10-06 LAB — CBC WITH DIFFERENTIAL (CANCER CENTER ONLY)
Abs Immature Granulocytes: 0.01 K/uL (ref 0.00–0.07)
Basophils Absolute: 0.1 K/uL (ref 0.0–0.1)
Basophils Relative: 3 %
Eosinophils Absolute: 0.2 K/uL (ref 0.0–0.5)
Eosinophils Relative: 6 %
HCT: 32.7 % — ABNORMAL LOW (ref 36.0–46.0)
Hemoglobin: 11 g/dL — ABNORMAL LOW (ref 12.0–15.0)
Immature Granulocytes: 0 %
Lymphocytes Relative: 22 %
Lymphs Abs: 0.7 K/uL (ref 0.7–4.0)
MCH: 29.6 pg (ref 26.0–34.0)
MCHC: 33.6 g/dL (ref 30.0–36.0)
MCV: 88.1 fL (ref 80.0–100.0)
Monocytes Absolute: 0.4 K/uL (ref 0.1–1.0)
Monocytes Relative: 14 %
Neutro Abs: 1.7 K/uL (ref 1.7–7.7)
Neutrophils Relative %: 55 %
Platelet Count: 238 K/uL (ref 150–400)
RBC: 3.71 MIL/uL — ABNORMAL LOW (ref 3.87–5.11)
RDW: 15.9 % — ABNORMAL HIGH (ref 11.5–15.5)
WBC Count: 3 K/uL — ABNORMAL LOW (ref 4.0–10.5)
nRBC: 0 % (ref 0.0–0.2)

## 2023-10-06 LAB — CMP (CANCER CENTER ONLY)
ALT: 13 U/L (ref 0–44)
AST: 22 U/L (ref 15–41)
Albumin: 4.1 g/dL (ref 3.5–5.0)
Alkaline Phosphatase: 67 U/L (ref 38–126)
Anion gap: 12 (ref 5–15)
BUN: 16 mg/dL (ref 8–23)
CO2: 23 mmol/L (ref 22–32)
Calcium: 9.5 mg/dL (ref 8.9–10.3)
Chloride: 97 mmol/L — ABNORMAL LOW (ref 98–111)
Creatinine: 0.92 mg/dL (ref 0.44–1.00)
GFR, Estimated: >60 mL/min (ref >60)
Glucose, Bld: 111 mg/dL — ABNORMAL HIGH (ref 70–99)
Potassium: 4.1 mmol/L (ref 3.5–5.1)
Sodium: 132 mmol/L — ABNORMAL LOW (ref 135–145)
Total Bilirubin: 0.4 mg/dL (ref 0.0–1.2)
Total Protein: 6.9 g/dL (ref 6.5–8.1)

## 2023-10-06 MED ORDER — FULVESTRANT 250 MG/5ML IM SOSY
500.0000 mg | PREFILLED_SYRINGE | Freq: Once | INTRAMUSCULAR | Status: AC
  Administered 2023-10-06: 500 mg via INTRAMUSCULAR
  Filled 2023-10-06: qty 10

## 2023-10-06 MED ORDER — DENOSUMAB 120 MG/1.7ML ~~LOC~~ SOLN
120.0000 mg | Freq: Once | SUBCUTANEOUS | Status: AC
  Administered 2023-10-06: 120 mg via SUBCUTANEOUS
  Filled 2023-10-06: qty 1.7

## 2023-10-06 NOTE — Patient Instructions (Signed)
 Denosumab Injection (Oncology) What is this medication? DENOSUMAB (den oh SUE mab) prevents weakened bones caused by cancer. It may also be used to treat noncancerous bone tumors that cannot be removed by surgery. It can also be used to treat high calcium levels in the blood caused by cancer. It works by blocking a protein that causes bones to break down quickly. This slows down the release of calcium from bones, which lowers calcium levels in your blood. It also makes your bones stronger and less likely to break (fracture). This medicine may be used for other purposes; ask your health care provider or pharmacist if you have questions. COMMON BRAND NAME(S): XGEVA What should I tell my care team before I take this medication? They need to know if you have any of these conditions: Dental disease Having surgery or tooth extraction Infection Kidney disease Low levels of calcium or vitamin D in the blood Malnutrition On hemodialysis Skin conditions or sensitivity Thyroid or parathyroid disease An unusual reaction to denosumab, other medications, foods, dyes, or preservatives Pregnant or trying to get pregnant Breast-feeding How should I use this medication? This medication is for injection under the skin. It is given by your care team in a hospital or clinic setting. A special MedGuide will be given to you before each treatment. Be sure to read this information carefully each time. Talk to your care team about the use of this medication in children. While it may be prescribed for children as young as 13 years for selected conditions, precautions do apply. Overdosage: If you think you have taken too much of this medicine contact a poison control center or emergency room at once. NOTE: This medicine is only for you. Do not share this medicine with others. What if I miss a dose? Keep appointments for follow-up doses. It is important not to miss your dose. Call your care team if you are unable to  keep an appointment. What may interact with this medication? Do not take this medication with any of the following: Other medications containing denosumab This medication may also interact with the following: Medications that lower your chance of fighting infection Steroid medications, such as prednisone or cortisone This list may not describe all possible interactions. Give your health care provider a list of all the medicines, herbs, non-prescription drugs, or dietary supplements you use. Also tell them if you smoke, drink alcohol, or use illegal drugs. Some items may interact with your medicine. What should I watch for while using this medication? Your condition will be monitored carefully while you are receiving this medication. You may need blood work while taking this medication. This medication may increase your risk of getting an infection. Call your care team for advice if you get a fever, chills, sore throat, or other symptoms of a cold or flu. Do not treat yourself. Try to avoid being around people who are sick. You should make sure you get enough calcium and vitamin D while you are taking this medication, unless your care team tells you not to. Discuss the foods you eat and the vitamins you take with your care team. Some people who take this medication have severe bone, joint, or muscle pain. This medication may also increase your risk for jaw problems or a broken thigh bone. Tell your care team right away if you have severe pain in your jaw, bones, joints, or muscles. Tell your care team if you have any pain that does not go away or that gets worse. Talk  to your care team if you may be pregnant. Serious birth defects can occur if you take this medication during pregnancy and for 5 months after the last dose. You will need a negative pregnancy test before starting this medication. Contraception is recommended while taking this medication and for 5 months after the last dose. Your care team  can help you find the option that works for you. What side effects may I notice from receiving this medication? Side effects that you should report to your care team as soon as possible: Allergic reactions--skin rash, itching, hives, swelling of the face, lips, tongue, or throat Bone, joint, or muscle pain Low calcium level--muscle pain or cramps, confusion, tingling, or numbness in the hands or feet Osteonecrosis of the jaw--pain, swelling, or redness in the mouth, numbness of the jaw, poor healing after dental work, unusual discharge from the mouth, visible bones in the mouth Side effects that usually do not require medical attention (report to your care team if they continue or are bothersome): Cough Diarrhea Fatigue Headache Nausea This list may not describe all possible side effects. Call your doctor for medical advice about side effects. You may report side effects to FDA at 1-800-FDA-1088. Where should I keep my medication? This medication is given in a hospital or clinic. It will not be stored at home. NOTE: This sheet is a summary. It may not cover all possible information. If you have questions about this medicine, talk to your doctor, pharmacist, or health care provider.  2024 [employer]/Gold Standard (2021-07-31 00:00:00)Fulvestrant Injection What is this medication? FULVESTRANT (ful VES trant) treats breast cancer. It works by blocking the hormone estrogen in breast tissue, which prevents breast cancer cells from spreading or growing. This medicine may be used for other purposes; ask your health care provider or pharmacist if you have questions. COMMON BRAND NAME(S): FASLODEX What should I tell my care team before I take this medication? They need to know if you have any of these conditions: Bleeding disorder Liver disease Low blood cell levels (white cells, red cells, and platelets) An unusual or allergic reaction to fulvestrant, other medications, foods, dyes, or  preservatives Pregnant or trying to get pregnant Breastfeeding How should I use this medication? This medication is injected into a muscle. It is given by your care team in a hospital or clinic setting. Talk to your care team about the use of this medication in children. Special care may be needed. Overdosage: If you think you have taken too much of this medicine contact a poison control center or emergency room at once. NOTE: This medicine is only for you. Do not share this medicine with others. What if I miss a dose? Keep appointments for follow-up doses. It is important not to miss your dose. Call your care team if you are unable to keep an appointment. What may interact with this medication? Fluoroestradiol F18 This list may not describe all possible interactions. Give your health care provider a list of all the medicines, herbs, non-prescription drugs, or dietary supplements you use. Also tell them if you smoke, drink alcohol, or use illegal drugs. Some items may interact with your medicine. What should I watch for while using this medication? Your condition will be monitored carefully while you are receiving this medication. You may need blood work done while you are taking this medication. This medication is injected into a muscle. Talk to your care team if you also take medications that prevent or treat blood clots, such as warfarin.  Blood thinners may increase the risk of bleeding or bruising in the muscle where this medication is injected. The benefits of this medication may outweigh the risks. Your care team can help you find the option that works for you. They can also help limit the risk of bleeding. Talk to your care team if you may be pregnant. Serious birth defects can occur if you take this medication during pregnancy and for 1 year after the last dose. You will need a negative pregnancy test before starting this medication. Contraception is recommended while taking this medication  and for 1 year after the last dose. Your care team can help you find the option that works for you. Do not breastfeed while taking this medication and for 1 year after the last dose. This medication may cause infertility. Talk to your care team if you are concerned about your fertility. What side effects may I notice from receiving this medication? Side effects that you should report to your care team as soon as possible: Allergic reactions or angioedema--skin rash, itching or hives, swelling of the face, eyes, lips, tongue, arms, or legs, trouble swallowing or breathing Pain, tingling, or numbness in the hands or feet Side effects that usually do not require medical attention (report to your care team if they continue or are bothersome): Bone, joint, or muscle pain Constipation Headache Hot flashes Nausea Pain, redness, or irritation at injection site Unusual weakness or fatigue This list may not describe all possible side effects. Call your doctor for medical advice about side effects. You may report side effects to FDA at 1-800-FDA-1088. Where should I keep my medication? This medication is given in a hospital or clinic. It will not be stored at home. NOTE: This sheet is a summary. It may not cover all possible information. If you have questions about this medicine, talk to your doctor, pharmacist, or health care provider.  2024 [employer]/Gold Standard (2022-11-14 00:00:00)

## 2023-10-07 ENCOUNTER — Other Ambulatory Visit: Payer: Self-pay

## 2023-10-07 DIAGNOSIS — M5459 Other low back pain: Secondary | ICD-10-CM | POA: Diagnosis not present

## 2023-10-07 DIAGNOSIS — Z8589 Personal history of malignant neoplasm of other organs and systems: Secondary | ICD-10-CM | POA: Diagnosis not present

## 2023-10-07 DIAGNOSIS — M47816 Spondylosis without myelopathy or radiculopathy, lumbar region: Secondary | ICD-10-CM | POA: Diagnosis not present

## 2023-10-07 LAB — CANCER ANTIGEN 27.29: CA 27.29: 52.4 U/mL — ABNORMAL HIGH (ref 0.0–38.6)

## 2023-10-08 ENCOUNTER — Other Ambulatory Visit: Payer: Self-pay

## 2023-10-08 ENCOUNTER — Telehealth: Payer: Self-pay

## 2023-10-08 DIAGNOSIS — Z79899 Other long term (current) drug therapy: Secondary | ICD-10-CM

## 2023-10-08 DIAGNOSIS — Z5181 Encounter for therapeutic drug level monitoring: Secondary | ICD-10-CM

## 2023-10-08 NOTE — Telephone Encounter (Signed)
 Pt LVM on nurse line mentioning speaking to Jon about a CBC.

## 2023-10-12 DIAGNOSIS — M47816 Spondylosis without myelopathy or radiculopathy, lumbar region: Secondary | ICD-10-CM | POA: Diagnosis not present

## 2023-10-12 DIAGNOSIS — Z8589 Personal history of malignant neoplasm of other organs and systems: Secondary | ICD-10-CM | POA: Diagnosis not present

## 2023-10-12 DIAGNOSIS — M5459 Other low back pain: Secondary | ICD-10-CM | POA: Diagnosis not present

## 2023-10-12 DIAGNOSIS — E538 Deficiency of other specified B group vitamins: Secondary | ICD-10-CM | POA: Diagnosis not present

## 2023-10-14 DIAGNOSIS — M47816 Spondylosis without myelopathy or radiculopathy, lumbar region: Secondary | ICD-10-CM | POA: Diagnosis not present

## 2023-10-14 DIAGNOSIS — Z8589 Personal history of malignant neoplasm of other organs and systems: Secondary | ICD-10-CM | POA: Diagnosis not present

## 2023-10-14 DIAGNOSIS — M5459 Other low back pain: Secondary | ICD-10-CM | POA: Diagnosis not present

## 2023-10-14 NOTE — Progress Notes (Signed)
 [facility]  [address] [place],  [place]  [address] [phone]  Clinic Day:  10/15/23  Referring physician: Rusty Lonni HERO, *  ASSESSMENT & PLAN:  Assessment: Primary malignant neoplasm of breast with metastasis [facility]) Patient has a complicated oncology history history of invasive ductal carcinoma of the right breast diagnosed in 1997.  She was treated with mastectomy and adjuvant chemotherapy.  She also participated in clinical trial using adjuvant high-dose chemotherapy followed by stem cell transplant.  She was treated with adjuvant tamoxifen for 5 years.  She developed biopsy-proven liver metastasis, estrogen and HER2 receptor positive, in November 2007.  She received trastuzumab for an unknown period of time.  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  She was started on zoledronic  acid in July and anastrozole  in August, 2015.    She developed a metastatic lesion of the right anterior chest in August, 2017 confirmed to be adenocarcinoma and treated with excision and radiation.  Anastrozole  was discontinued and she was switched to letrozole .  Palbociclib  was added in January, 2018 after PET imaging revealed 2 new foci at T8 and the manubrium.  The dose of palbociclib  had to be decreased over time.  Zoledronic  acid was discontinued in January, 2019.  Letrozole  was discontinued due to progression and was started on fulvestrant  in February 2019 due to progression. She received palliative radiation to the cervical spine in February, 2019. Palbociclib  was held during radiation and resumed at 75 mg daily once completed. Denosumab  was started in March, 2019.  She has remained on this regimen without evidence of progression.  Most recent imaging was a PET in January, 2025 which was stable, with evidence of treated bone metastases.  Her current schedule is to take the Ibrance  every other day for 42 days and then take 7  days off.  Based on her blood counts, I do not think we can go any higher on the dose. Now with her rising CA 27.29, we suspect her cancer is becoming resistant and so I will get a PET scan and discuss with her next month whether to change treatment. I am leaning towards Aromasin and Afinitor if we need to change, but another possibility would be Enhertu, based on prior HER2 positivity.    Severe Osteoarthritis/Degenerative Disc Disease  She has already had knee replacement and right hip replacement. 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 She is now undergoing physical therapy with some improvement of her back pain.   Plan:  She will restart her Ibrance  75 mg on July, 30th. She had a MRI of the lumbar spine done on 07/22/2023 which revealed numerous osseous lesions throughout the lower spine, lumbar spine, and pelvis, multilevel degenerative disc and facet changes of the lumbar spine, at L4-L5, moderate spinal canal stenosis and mild to moderate bilateral foraminal stenosis, at L2-L3 and L3-L4, mild spinal canal and bilateral foraminal stenosis, and at L5-S1, mild bilateral foraminal stenosis. I will order a repeat PET scan for further evaluation. She informed me that she had a dexamethasone  injection into her spine which gave her no pain relief. Since starting physical therapy she states that this has helped her pain more and has 6 sessions so far. Her CA 27.29 has increased from 35.3, to 42.9, to 47.4, and now to 52.4 over the last 4 months. We discussed potential alternative treatments such as IV and oral  chemotherapy and hormonal therapy, she may have to stop her fulvestrant  injections. Today she has a slightly low WBC of 3.9 with an ANC of 2400, hemoglobin of 11.2 improved from 11.0, and platelet count of 274,000. I will see her back in 3 weeks with CBC, CMP, and CA 27.29 but we will get a PET scan on August 11th with hopefully a STAT  reading. We would then make treatment decisions. I discussed the fact that the next hormonal treatment would be Aromasin and Afinitor if we need to change, and continuing the Xgeva . However, we would probably have to give 50% dosing on the Afinitor. She has also had HER2 positivity in the past and we could consider Enhertu IV chemotherapy. The patient understands the plans discussed today and is in agreement with them.  She knows to contact our office if she develops concerns prior to her next appointment.  I provided 28 minutes of face-to-face time during this encounter and > 50% was spent counseling as documented under my assessment and plan.   [name], MD [name] A DEPT OF [facility] [address] [place] [address] Dept: [phone] Dept Fax: [phone]   No orders of the defined types were placed in this encounter.   CHIEF COMPLAINT:  CC: Recurrent hormone receptor positive breast cancer   Current Treatment:  Palbociclib /fulvestrant /denosumab    HISTORY OF PRESENT ILLNESS:  Jade Mooney is a 78 y.o. female who we began seeing in January 2023 for transfer of care from Dr. Sandria Gell for the continued treatment and management of metastatic breast cancer. She was originally diagnosed with stage III hormone receptor positive right breast cancer in 1997.  She was treated with right mastectomy and adjuvant chemotherapy with Taxotere for 4 cycles.  She also participated in a [facility] protocol with high-dose chemotherapy, cyclophosphamide/carboplatin/BCNU, followed by stem cell rescue in September 1997. She completed 5 years of tamoxifen in November 2002.  Unfortunately, she developed metastatic liver lesions, biopsy proven metastatic breast cancer, estrogen receptor positive and HER2 receptor positive, in November 2007.  She was treated with trastuzumab for an unknown period of time.    In July 2015, she  was found to have incidental findings of mixed lytic and sclerotic bony lesions on CT abdomen and pelvis. She underwent biopsy of the right iliac bone and surgical pathology confirmed invasive ductal carcinoma, grade 2, estrogen and progesterone receptor positive. Bone density from May 2015 was normal. She was started on zolendronic acid every 12 weeks in July 2015. She was also found to have three scalp lesions, biopsied in July 2015 confirming metastatic breast cancer. HER2 negative. She was started on anastrozole  in August 2015.   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     Anastrozole  was discontinued and she was switched to letrozole  in April 2018.  Palbociclib  was added in January 2018 after PET imaging revealed 2 new foci at T8 and the manubrium.   Zoledronic  acid was discontinued in January 2019.  Letrozole  was discontinued in February 2019 due to progression. She was started on fulvestrant  in February 2019. She received palliative radiation to the cervical spine in February 2019. Palbociclib  was held during that time and resumed at 75 mg daily once completed. Denosumab  was started in March 2019.    She has continued on palbociclib  75 mg daily for 3 weeks  on and 1 week off, along with fulvestrant  and denosumab  every 4 weeks.  Most recent imaging was a PET scan in December 2023 revealed significant interval change in the diffuse osteoblastic metastatic lesions in the axial and proximal appendicular skeleton without abnormal FDG avidity compatible with chronic treated disease. No evidence of hypermetabolic local recurrent breast cancer or viable distant metastatic disease.    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 Patient states that she feels ok but complains of lower back pain rating 2/10. She will restart her Ibrance  75 mg on July, 30th. She had a MRI of the lumbar spine done on 07/22/2023 which revealed nmerous osseous lesions throughout the lower spine, lumbar spine, and pelvis, multilevel degenerative disc and facet changes of the lumbar spine, at L4-L5, moderate spinal canal stenosis and mild to moderate bilateral foraminal stenosis, at L2-L3 and L3-L4, mild spinal canal and bilateral foraminal stenosis, and at L5-S1, mild bilateral foraminal stenosis. I will order a repeat PET scan for further evaluation. She informed me that she had a dexamethasone  injection into her spine which gave her no pain relief. Since starting physical therapy she states that this has helped her pain more and has 6 sessions so far. Her CA 27.29 has increased from 35.3, to 42.9, to 47.4, and now to 52.4 over the last 4 months. We discussed potential alternative treatments such as IV and oral chemotherapy and hormonal therapy, she may have to stop her fulvestrant  injections. Today she has a slightly low WBC of 3.9 with an ANC of 2400, hemoglobin of 11.2 improved from 11.0, and platelet count of 274,000. I will see her back in 3 weeks with CBC, CMP, and CA 27.29 but we will get a PET scan on August 11th with hopefully a STAT reading. We would then make treatment decisions. I discussed the fact that the next hormonal treatment would be Aromasin and Afinitor if we need to change, and continuing the Xgeva . However, we would probably have to give 50% dosing on the Afinitor. She has also had HER 2  positivity in the past and so we could also consider Enhertu IV chemotherapy.  She denies fever, chills, night sweats, or other signs of infection. She denies cardiorespiratory and gastrointestinal issues. Her appetite is very good and Her weight has been stable. This patient is accompanied in the office by her husband.  REVIEW OF SYSTEMS:  Review of Systems  Constitutional: Negative.  Negative for appetite change, chills, diaphoresis, fatigue, fever and unexpected weight change.  HENT:  Negative.  Negative for hearing loss, lump/mass, mouth sores, nosebleeds, sore throat, tinnitus, trouble swallowing and voice change.   Eyes: Negative.  Negative for eye problems and icterus.  Respiratory: Negative.  Negative for chest tightness, cough, hemoptysis, shortness of breath and wheezing.   Cardiovascular:  Positive for leg swelling (left foot swelling, night only). Negative for chest pain and palpitations.  Gastrointestinal: Negative.  Negative for abdominal  distention, abdominal pain, blood in stool, constipation, diarrhea, nausea, rectal pain and vomiting.  Endocrine: Negative.   Genitourinary: Negative.  Negative for bladder incontinence, difficulty urinating, dyspareunia, dysuria, frequency, hematuria, menstrual problem, nocturia, pelvic pain, vaginal bleeding and vaginal discharge.   Musculoskeletal:  Positive for arthralgias, back pain (2/10) and myalgias. Negative for flank pain, gait problem, neck pain and neck stiffness.       Iliac joint/back pain rating 5/10 and right hip pain  Skin: Negative.  Negative for itching, rash and wound.  Neurological:  Negative for dizziness, extremity weakness, gait problem, headaches, light-headedness, numbness, seizures and speech difficulty.  Hematological: Negative.  Negative for adenopathy. Does not bruise/bleed easily.  Psychiatric/Behavioral: Negative.  Negative for confusion, decreased concentration, depression, sleep disturbance and suicidal ideas. The  patient is not nervous/anxious.     VITALS:  Blood pressure (!) 176/92, pulse 60, temperature 97.6 F (36.4 C), temperature source Oral, resp. rate 18, height 5' 2 (1.575 m), weight 179 lb 4.8 oz (81.3 kg), SpO2 100%.  Wt Readings from Last 3 Encounters:  10/15/23 179 lb 4.8 oz (81.3 kg)  08/11/23 179 lb 14.4 oz (81.6 kg)  06/16/23 179 lb 12 oz (81.5 kg)    Body mass index is 32.79 kg/m.  Performance status (ECOG): 1 - Symptomatic but completely ambulatory  PHYSICAL EXAM:  Physical Exam Vitals and nursing note reviewed. Exam conducted with a chaperone present.  Constitutional:      General: She is not in acute distress.    Appearance: Normal appearance. She is normal weight. She is not ill-appearing, toxic-appearing or diaphoretic.  HENT:     Head: Normocephalic and atraumatic.     Right Ear: Tympanic membrane, ear canal and external ear normal. There is no impacted cerumen.     Left Ear: Tympanic membrane, ear canal and external ear normal. There is no impacted cerumen.     Nose: Nose normal. No congestion or rhinorrhea.     Mouth/Throat:     Mouth: Mucous membranes are moist.     Pharynx: Oropharynx is clear. No oropharyngeal exudate or posterior oropharyngeal erythema.  Eyes:     General: No scleral icterus.       Right eye: No discharge.        Left eye: No discharge.     Extraocular Movements: Extraocular movements intact.     Conjunctiva/sclera: Conjunctivae normal.     Pupils: Pupils are equal, round, and reactive to light.  Neck:     Vascular: No carotid bruit.  Cardiovascular:     Rate and Rhythm: Normal rate and regular rhythm.     Pulses: Normal pulses.     Heart sounds: Normal heart sounds. No murmur heard.    No friction rub. No gallop.  Pulmonary:     Effort: Pulmonary effort is normal. No respiratory distress.     Breath sounds: Normal breath sounds. No stridor. No wheezing, rhonchi or rales.  Chest:     Chest wall: No tenderness.     Comments: Right  mastectomy is negative with some telangiectasia below the mastectomy scar. Left breast is without masses.  Abdominal:     General: Bowel sounds are normal. There is no distension.     Palpations: Abdomen is soft. There is no hepatomegaly, splenomegaly or mass.     Tenderness: There is no abdominal tenderness. There is no right CVA tenderness, left CVA tenderness, guarding or rebound.     Hernia: No hernia is present.     Comments:  Liver feels normal  Musculoskeletal:        General: No swelling, deformity or signs of injury. Normal range of motion.     Cervical back: Normal range of motion and neck supple. No rigidity or tenderness.     Right lower leg: No edema.     Left lower leg: No edema.     Comments: Persistent induration of the popliteal and upper left calf area  Lymphadenopathy:     Cervical: No cervical adenopathy.     Right cervical: No superficial, deep or posterior cervical adenopathy.    Left cervical: No superficial, deep or posterior cervical adenopathy.     Upper Body:     Right upper body: No supraclavicular, axillary or pectoral adenopathy.     Left upper body: No supraclavicular, axillary or pectoral adenopathy.  Skin:    General: Skin is warm and dry.     Coloration: Skin is not jaundiced or pale.     Findings: No bruising, erythema, lesion or rash.  Neurological:     General: No focal deficit present.     Mental Status: She is alert and oriented to person, place, and time. Mental status is at baseline.     Cranial Nerves: No cranial nerve deficit.     Sensory: No sensory deficit.     Motor: No weakness.     Coordination: Coordination normal.     Gait: Gait normal.     Deep Tendon Reflexes: Reflexes normal.  Psychiatric:        Mood and Affect: Mood normal.        Behavior: Behavior normal.        Thought Content: Thought content normal.        Judgment: Judgment normal.    LABS:      Latest Ref Rng & Units 10/15/2023   10:57 AM 10/06/2023   10:29 AM  09/08/2023   10:06 AM  CBC  WBC 4.0 - 10.5 K/uL 3.9  3.0  3.8   Hemoglobin 12.0 - 15.0 g/dL 88.7  88.9  88.6   Hematocrit 36.0 - 46.0 % 33.9  32.7  33.8   Platelets 150 - 400 K/uL 274  238  224       Latest Ref Rng & Units 10/06/2023   10:29 AM 09/08/2023   10:06 AM 08/11/2023    9:58 AM  CMP  Glucose 70 - 99 mg/dL 888  898  883   BUN 8 - 23 mg/dL 16  16  15    Creatinine 0.44 - 1.00 mg/dL 9.07  9.11  8.99   Sodium 135 - 145 mmol/L 132  132  137   Potassium 3.5 - 5.1 mmol/L 4.1  4.1  4.3   Chloride 98 - 111 mmol/L 97  98  100   CO2 22 - 32 mmol/L 23  23  23    Calcium  8.9 - 10.3 mg/dL 9.5  89.5  89.8   Total Protein 6.5 - 8.1 g/dL 6.9  6.7  6.9   Total Bilirubin 0.0 - 1.2 mg/dL 0.4  0.5  0.3   Alkaline Phos 38 - 126 U/L 67  64  58   AST 15 - 41 U/L 22  21  24    ALT 0 - 44 U/L 13  13  13     Component Ref Range & Units (hover) 8 d ago (10/06/23) 1 mo ago (09/08/23) 2 mo ago (08/11/23) 3 mo ago (07/14/23) 3 mo ago (07/06/23) 4 mo ago (06/16/23) 4  mo ago (05/19/23)  CA 27.29 52.4 High  47.4 High  CM 42.9 High  CM 35.3 CM 36.0 CM 38.1 CM 39.2 High  CM   No results found for: CEA1, CEA / No results found for: CEA1, CEA No results found for: PSA1 No results found for: CAN199 No results found for: CAN125  No results found for: TOTALPROTELP, ALBUMINELP, A1GS, A2GS, BETS, BETA2SER, GAMS, MSPIKE, SPEI No results found for: TIBC, FERRITIN, IRONPCTSAT Lab Results  Component Value Date   LDH 138 01/17/2011   LDH 155 12/28/2009   LDH 155 12/29/2008   STUDIES:  Exam: 07/22/2023 MRI of the Lumbar Spine Impression: Numerous osseous lesions throughout the lower spine, lumbar spine, and pelvis in keeping history of metastatic breast cancer. No pathologic fracture of the lumbar spine or scrum.  Multilevel degenerative disc and facet changes of the lumbar spine.  At L4-L5, moderate spinal canal stenosis and mild to moderate bilateral foraminal stenosis.   At L2-L3 and L3-L4, mild spinal canal and bilateral foraminal stenosis.  At L5-S1, mild bilateral foraminal stenosis.    MRI of the cervical spine  Impression: Anterior cervical fusion at C3-4 and C5-6. At C3-4 there is a severe left facet arthropathy and moderate bilateral foraminal stenosis and at C5-6 there is a moderate left foraminal stenosis.  C4-5 there has a mild bilateral foraminal stenosis and mild stenosis and C6-7 has a moderate bilateral foraminal stenosis and mild spinal stenosis.  EXAM: 05/26/2023 DIGITAL SCREENING UNILATERAL LEFT MAMMOGRAM WITH CAD AND TOMOSYNTHESIS IMPRESSION: No mammographic evidence of malignancy.     HISTORY:   Past Medical History:  Diagnosis Date   Anxiety    Cancer [facility])    Breast 1997 right tx with mastectomy and chemo, metastatic now   GERD (gastroesophageal reflux disease)    Hypercholesterolemia    Hypertension    Hypothyroidism    Osteoarthritis    oa   Personal history of chemotherapy    Personal history of radiation therapy    Secondary malignant neoplasm of bone and bone marrow (HCC)    TIA (transient ischemic attack) last 09/10/20   x 3 total, they seem to occur every 5 years    Past Surgical History:  Procedure Laterality Date   CATARACT EXTRACTION, BILATERAL     CERVICAL FUSION  2020   MASTECTOMY MODIFIED RADICAL Right 1997   porta cath insertion  1997   later removed   radiation tx  finished 02-25-16   x 20 tx   TONSILLECTOMY     TOTAL HIP ARTHROPLASTY Left 03/12/2016   Procedure: LEFT TOTAL HIP ARTHROPLASTY ANTERIOR APPROACH;  Surgeon: Dempsey Moan, MD;  Location: [facility];  Service: Orthopedics;  Laterality: Left;   TOTAL HIP ARTHROPLASTY Right 04/13/2023   Procedure: TOTAL HIP ARTHROPLASTY ANTERIOR APPROACH;  Surgeon: Moan Dempsey, MD;  Location: [facility];  Service: Orthopedics;  Laterality: Right;   TOTAL KNEE ARTHROPLASTY Left    TOTAL KNEE ARTHROPLASTY Right 10/30/2021   Procedure: TOTAL KNEE ARTHROPLASTY;   Surgeon: Gerome Charleston, MD;  Location: [facility];  Service: Orthopedics;  Laterality: Right;  adductor canal 120   TUBAL LIGATION      Family History  Problem Relation Age of Onset   Breast cancer Maternal Aunt     Social History:  reports that she has never smoked. She has never used smokeless tobacco. She reports current alcohol use of about 1.0 standard drink of alcohol per week. She reports that she does not use drugs.The patient is accompanied by  her husband today.  Allergies:  Allergies  Allergen Reactions   Oxycodone-Aspirin  Nausea And Vomiting, Nausea Only and Other (See Comments)    aspirin  / oxycodone   Other Nausea And Vomiting, Other (See Comments) and Rash   Oxycodone-Acetaminophen  Other (See Comments)   Erythromycin Nausea Only and Other (See Comments)   Oxycodone Other (See Comments)    oxycodone   Oxycodone Hcl Nausea And Vomiting   Tramadol  Itching   Amoxicillin Hives and Dermatitis    Has patient had a PCN reaction causing immediate rash, facial/tongue/throat swelling, SOB or lightheadedness with hypotension:unsure  Has patient had a PCN reaction causing severe rash involving mucus membranes or skin necrosis:No  Has patient had a PCN reaction that required hospitalization:No  Has patient had a PCN reaction occurring within the last 10 years: Yes  If all of the above answers are NO, then may proceed with Cephalosporin use.  Has patient had a PCN reaction causing immediate rash, facial/tongue/throat swelling, SOB or lightheadedness with hypotension:unsure, Has patient had a PCN reaction causing severe rash involving mucus membranes or skin necrosis:No, Has patient had a PCN reaction that required hospitalization:No, Has patient had a PCN reaction occurring within the last 10 years: Yes, If all of the above answers are NO, then may proceed with Cephalosporin use.    Current Medications: Current Outpatient Medications  Medication Sig Dispense Refill    triamcinolone  cream (KENALOG ) 0.1 % Apply topically 2 (two) times daily as needed.     acetaminophen  (TYLENOL ) 500 MG tablet Take 1,000 mg by mouth every 6 (six) hours as needed for mild pain.     acidophilus (RISAQUAD) CAPS capsule Take 1 capsule by mouth daily.     ascorbic acid (VITAMIN C) 1000 MG tablet Take 1,000 mg by mouth daily.     aspirin  325 MG tablet Take 325 mg by mouth daily.     beta carotene 15 MG capsule Take 15 mg by mouth daily.     Biotin 5000 MCG TABS Take 5,000 mcg by mouth daily.     CALCIUM  CITRATE-VITAMIN D PO Take 1 tablet by mouth in the morning, at noon, in the evening, and at bedtime.     clopidogrel  (PLAVIX ) 75 MG tablet Take 1 tablet (75 mg total) by mouth daily.     cyanocobalamin  (,VITAMIN B-12,) 1000 MCG/ML injection Inject 1,000 mcg into the muscle every 30 (thirty) days.     Denosumab  (XGEVA  [place]) Inject 1 Dose into the skin every 30 (thirty) days.     famotidine  (PEPCID ) 40 MG tablet Take 40 mg by mouth daily.     glucosamine-chondroitin 500-400 MG tablet Take 1 tablet by mouth daily.     ketotifen (ZADITOR) 0.025 % ophthalmic solution Place 1 drop into both eyes daily as needed (allergies).     levothyroxine  (SYNTHROID , LEVOTHROID) 88 MCG tablet Take 88 mcg by mouth daily before breakfast.     LORazepam  (ATIVAN ) 1 MG tablet Take 1 tablet (1 mg total) by mouth every 8 (eight) hours as needed for anxiety. 30 tablet 0   losartan  (COZAAR ) 25 MG tablet Take 25 mg by mouth daily.     Lutein 20 MG CAPS Take 20 mg by mouth daily.     meclizine  (ANTIVERT ) 25 MG tablet Take 25 mg by mouth 3 (three) times daily as needed for dizziness.     melatonin 5 MG TABS Take 5 mg by mouth at bedtime as needed (sleep).     metroNIDAZOLE  (METROCREAM ) 0.75 % cream Apply  1 Application topically daily.     Multiple Vitamins-Minerals (MULTIVITAMIN WITH MINERALS) tablet Take 1 tablet by mouth daily.     Omega-3 1000 MG CAPS Take 1,000 mg by mouth in the morning and at bedtime.      palbociclib  (IBRANCE ) 75 MG tablet Take 1 tablet (75 mg total) by mouth daily. Take as directed by MD for 21 days on, 7 days off, repeat every 28 days. (Patient taking differently: Take 75 mg by mouth every other day. Take as directed by MD for 21 days on, 7 days off, repeat every 28 days.) 21 tablet 5   pantoprazole  (PROTONIX ) 40 MG tablet Take 40 mg by mouth daily.     Polyethyl Glycol-Propyl Glycol 0.4-0.3 % SOLN Place 1-2 drops into both eyes 2 (two) times daily.     rOPINIRole  (REQUIP ) 2 MG tablet Take 4 mg by mouth at bedtime.     rosuvastatin  (CRESTOR ) 20 MG tablet Take 1 tablet (20 mg total) by mouth daily.     vitamin E 180 MG (400 UNITS) capsule Take 400 Units by mouth daily.     zinc gluconate 50 MG tablet Take 50 mg by mouth daily.     No current facility-administered medications for this visit.    I,Jasmine M Lassiter,acting as a scribe for [name], MD.,have documented all relevant documentation on the behalf of [name], MD,as directed by  [name], MD while in the presence of [name], MD.

## 2023-10-15 ENCOUNTER — Inpatient Hospital Stay (HOSPITAL_BASED_OUTPATIENT_CLINIC_OR_DEPARTMENT_OTHER): Admitting: Oncology

## 2023-10-15 ENCOUNTER — Inpatient Hospital Stay

## 2023-10-15 ENCOUNTER — Other Ambulatory Visit: Payer: Self-pay | Admitting: Oncology

## 2023-10-15 VITALS — BP 176/92 | HR 60 | Temp 97.6°F | Resp 18 | Ht 62.0 in | Wt 179.3 lb

## 2023-10-15 DIAGNOSIS — C787 Secondary malignant neoplasm of liver and intrahepatic bile duct: Secondary | ICD-10-CM | POA: Diagnosis not present

## 2023-10-15 DIAGNOSIS — C50919 Malignant neoplasm of unspecified site of unspecified female breast: Secondary | ICD-10-CM

## 2023-10-15 DIAGNOSIS — C7951 Secondary malignant neoplasm of bone: Secondary | ICD-10-CM | POA: Diagnosis not present

## 2023-10-15 DIAGNOSIS — Z1721 Progesterone receptor positive status: Secondary | ICD-10-CM | POA: Diagnosis not present

## 2023-10-15 DIAGNOSIS — Z5181 Encounter for therapeutic drug level monitoring: Secondary | ICD-10-CM

## 2023-10-15 DIAGNOSIS — Z170 Estrogen receptor positive status [ER+]: Secondary | ICD-10-CM | POA: Diagnosis not present

## 2023-10-15 DIAGNOSIS — Z79899 Other long term (current) drug therapy: Secondary | ICD-10-CM

## 2023-10-15 DIAGNOSIS — Z5111 Encounter for antineoplastic chemotherapy: Secondary | ICD-10-CM | POA: Diagnosis not present

## 2023-10-15 LAB — CBC WITH DIFFERENTIAL (CANCER CENTER ONLY)
Abs Immature Granulocytes: 0.01 K/uL (ref 0.00–0.07)
Basophils Absolute: 0.1 K/uL (ref 0.0–0.1)
Basophils Relative: 2 %
Eosinophils Absolute: 0.2 K/uL (ref 0.0–0.5)
Eosinophils Relative: 5 %
HCT: 33.9 % — ABNORMAL LOW (ref 36.0–46.0)
Hemoglobin: 11.2 g/dL — ABNORMAL LOW (ref 12.0–15.0)
Immature Granulocytes: 0 %
Lymphocytes Relative: 22 %
Lymphs Abs: 0.9 K/uL (ref 0.7–4.0)
MCH: 29.4 pg (ref 26.0–34.0)
MCHC: 33 g/dL (ref 30.0–36.0)
MCV: 89 fL (ref 80.0–100.0)
Monocytes Absolute: 0.4 K/uL (ref 0.1–1.0)
Monocytes Relative: 10 %
Neutro Abs: 2.4 K/uL (ref 1.7–7.7)
Neutrophils Relative %: 61 %
Platelet Count: 274 K/uL (ref 150–400)
RBC: 3.81 MIL/uL — ABNORMAL LOW (ref 3.87–5.11)
RDW: 15.9 % — ABNORMAL HIGH (ref 11.5–15.5)
WBC Count: 3.9 K/uL — ABNORMAL LOW (ref 4.0–10.5)
nRBC: 0 % (ref 0.0–0.2)

## 2023-10-16 ENCOUNTER — Telehealth: Payer: Self-pay | Admitting: Oncology

## 2023-10-16 ENCOUNTER — Other Ambulatory Visit (HOSPITAL_COMMUNITY): Payer: Self-pay | Admitting: Oncology

## 2023-10-16 DIAGNOSIS — C50919 Malignant neoplasm of unspecified site of unspecified female breast: Secondary | ICD-10-CM

## 2023-10-16 NOTE — Telephone Encounter (Signed)
 Patient has been scheduled for follow-up visit per 10/16/23 LOS.  Pt aware of scheduled appt details.

## 2023-10-19 ENCOUNTER — Telehealth: Payer: Self-pay

## 2023-10-19 DIAGNOSIS — M47816 Spondylosis without myelopathy or radiculopathy, lumbar region: Secondary | ICD-10-CM | POA: Diagnosis not present

## 2023-10-19 DIAGNOSIS — Z8589 Personal history of malignant neoplasm of other organs and systems: Secondary | ICD-10-CM | POA: Diagnosis not present

## 2023-10-19 DIAGNOSIS — M5459 Other low back pain: Secondary | ICD-10-CM | POA: Diagnosis not present

## 2023-10-19 NOTE — Telephone Encounter (Signed)
 Pt called to make you aware that her PET for 8/12 has been moved to 10/20/2023. She wanted you to know, so she could be notified of the results even while she is out of town for 11 days (she mentioned she will be in the [place] ). She would like to be called @ [phone] w/results. Her f/u appt with you is on 11/03/23.

## 2023-10-20 ENCOUNTER — Telehealth: Payer: Self-pay

## 2023-10-20 ENCOUNTER — Encounter (HOSPITAL_COMMUNITY)
Admission: RE | Admit: 2023-10-20 | Discharge: 2023-10-20 | Disposition: A | Discharge status: Home / Self Care | Source: Ambulatory Visit | Attending: Oncology | Admitting: Oncology

## 2023-10-20 DIAGNOSIS — C7951 Secondary malignant neoplasm of bone: Secondary | ICD-10-CM | POA: Insufficient documentation

## 2023-10-20 DIAGNOSIS — C50919 Malignant neoplasm of unspecified site of unspecified female breast: Secondary | ICD-10-CM | POA: Diagnosis not present

## 2023-10-20 LAB — GLUCOSE, CAPILLARY: Glucose-Capillary: 103 mg/dL — ABNORMAL HIGH (ref 70–99)

## 2023-10-20 MED ORDER — FLUDEOXYGLUCOSE F - 18 (FDG) INJECTION
9.4600 | Freq: Once | INTRAVENOUS | Status: AC
  Administered 2023-10-20: 9.46 via INTRAVENOUS

## 2023-10-20 NOTE — Telephone Encounter (Signed)
-----   Message from [name] sent at 10/20/2023  5:00 PM EDT ----- Regarding: call Tell her PET looks good. 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

## 2023-10-20 NOTE — Telephone Encounter (Signed)
 Called patient and notified her of the PET scan results.

## 2023-10-22 ENCOUNTER — Encounter: Payer: Self-pay | Admitting: Oncology

## 2023-10-27 ENCOUNTER — Encounter: Payer: Self-pay | Admitting: Oncology

## 2023-10-28 NOTE — Progress Notes (Signed)
 Jade Mooney  [address] [place],  [place]  [address] [phone]  Clinic Day:  11/03/23  Referring physician: Rusty Lonni Mooney, *  ASSESSMENT & PLAN:  Assessment: Primary malignant neoplasm of breast with metastasis Jade Mooney) Patient has a complicated oncology history history of invasive ductal carcinoma of the right breast diagnosed in 1997.  She was treated with mastectomy and adjuvant chemotherapy.  She also participated in clinical trial using adjuvant high-dose chemotherapy followed by stem cell transplant.  She was treated with adjuvant tamoxifen for 5 years.  She developed biopsy-proven liver metastasis, estrogen and HER2 receptor positive, in November 2007.  She received trastuzumab for an unknown period of time.  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  She was started on zoledronic  acid in July and anastrozole  in August, 2015.    She developed a metastatic lesion of the right anterior chest in August, 2017 confirmed to be adenocarcinoma and treated with excision and radiation.  Anastrozole  was discontinued and she was switched to letrozole .  Palbociclib  was added in January, 2018 after PET imaging revealed 2 new foci at T8 and the manubrium.  The dose of palbociclib  had to be decreased over time.  Zoledronic  acid was discontinued in January, 2019.  Letrozole  was discontinued due to progression and was started on fulvestrant  in February 2019 due to progression. She received palliative radiation to the cervical spine in February, 2019. Palbociclib  was held during radiation and resumed at 75 mg daily once completed. Denosumab  was started in March, 2019.  She has remained on this regimen without evidence of progression.  A PET in January, 2025 was stable, with evidence of treated bone metastases.  Her current schedule is to take the Ibrance  every other day for 42 days and then take 7 days off.  Based on her blood  counts, I do not think we can go any higher on the dose. Now with her rising CA 27.29, we suspected her cancer may be becoming resistant and so a PET scan was done on 10/20/2023, which revealed diffuse sclerotic osseous metastatic disease without focal hypermetabolism and no findings for visceral metastatic disease in the chest, abdomen or pelvis.  All is stable so we have decided to continue the current regimen.   Severe Osteoarthritis/Degenerative Disc Disease  She has already had knee replacement and right hip replacement. 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 She is now undergoing physical therapy with some improvement of her back pain.   Plan:  She informed me that she has started taking an oral pain supplement called PainAway and only uses it occasionally. This contains glucosamine chondroitin, MSM, hyaluronic acid, willow bark, green tea, ginger, aloe, and other herbs. I reassured her that this is ok to take. Her CA 27.29 has increased from 35.3, to 42.9, to 47.4, and now to 52.4 over the last 4 months. Due to this increase, a PET scan was done on 10/20/2023, which revealed diffuse sclerotic osseous metastatic disease without focal hypermetabolism and no findings for visceral metastatic disease in the chest, abdomen or pelvis. All is stable so I reassured her that with the result of the PET scan that her treatment is still working and if her CA 19.9 level continues to rise we can do a repeat scan later but we feel we can continue the current regimen for now.  She continues Ibrance  for 6 weeks  on and 1 week off but only takes it every other day. She has a low WBC of 2.9 with an ANC of 1600 down from 3.9 with an ANC of 2400. She has a low hemoglobin of 11.3 improved from 11.2, and platelet count of 249,000. Her CMP is normal other than an mildly elevated creatinine of 1.07. Her CA 27.29 today is pending. She will receive her  Faslodex   and Xgeva  injections today. She will then return in 4 and 8 weeks to receive her Faslodex  and Xgeva  injections. I will see her back in 5 weeks with CBC, CMP, and CA 27.29. The patient understands the plans discussed today and is in agreement with them.  She knows to contact our office if she develops concerns prior to her next appointment.  I provided 16 minutes of face-to-face time during this encounter and > 50% was spent counseling as documented under my assessment and plan.   Jade [facility]oney [facility] - A DEPT OF [facility] [address] [place] [address] Dept: [phone] Dept Fax: [phone]   No orders of the defined types were placed in this encounter.   CHIEF COMPLAINT:  CC: Recurrent hormone receptor positive breast cancer   Current Treatment:  Palbociclib /fulvestrant /denosumab    HISTORY OF PRESENT ILLNESS:  Jade Mooney is a 78 y.o. female who we began seeing in January 2023 for transfer of care from Jade Mooney for the continued treatment and management of metastatic breast cancer. She was originally diagnosed with stage III hormone receptor positive right breast cancer in 1997.  She was treated with right mastectomy and adjuvant chemotherapy with Taxotere for 4 cycles.  She also participated in a [facility] protocol with high-dose chemotherapy, cyclophosphamide/carboplatin/BCNU, followed by stem cell rescue in September 1997. She completed 5 years of tamoxifen in November 2002.  Unfortunately, she developed metastatic liver lesions, biopsy proven metastatic breast cancer, estrogen receptor positive and HER2 receptor positive, in November 2007.  She was treated with trastuzumab for an unknown period of time.    In July 2015, she was found to have incidental findings of mixed lytic and sclerotic bony lesions on CT abdomen and pelvis. She underwent biopsy of the right iliac bone and surgical  pathology confirmed invasive ductal carcinoma, grade 2, estrogen and progesterone receptor positive. Bone density from May 2015 was normal. She was started on zolendronic acid every 12 weeks in July 2015. She was also found to have three scalp lesions, biopsied in July 2015 confirming metastatic breast cancer. HER2 negative. She was started on anastrozole  in August 2015.   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     Anastrozole  was discontinued and she was switched to letrozole  in April 2018.  Palbociclib  was added in January 2018 after PET imaging revealed 2 new foci at T8 and the manubrium.   Zoledronic  acid was discontinued in January 2019.  Letrozole  was discontinued in February 2019 due to progression. She was started on fulvestrant  in February 2019. She received palliative radiation to the cervical spine in February 2019. Palbociclib  was held during that time and resumed at 75 mg daily once completed. Denosumab  was started in March 2019.    She has continued on palbociclib  75 mg daily for 3 weeks on and 1 week off, along with fulvestrant  and denosumab  every 4 weeks.  Most recent imaging was  a PET scan in December 2023 revealed significant interval change in the diffuse osteoblastic metastatic lesions in the axial and proximal appendicular skeleton without abnormal FDG avidity compatible with chronic treated disease. No evidence of hypermetabolic local recurrent breast cancer or viable distant metastatic disease.    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 Patient states that she feels well but complains of back pain rating 4/10. She informed me that she has started taking an oral pain supplement called PainAway and only uses it when occasionally. This contains glucosamine chondroitin, MSM, hyaluronic acid, willow bark, green tea, ginger, aloe, and other herbs. I reassured her that this is ok to take. Her CA 27.29 has increased from 35.3, to 42.9, to 47.4, and now to 52.4 over the last 4 months. Due to this increase a PET scan was done on 10/20/2023, which revealed diffuse sclerotic osseous metastatic disease without focal hypermetabolism and no findings for visceral metastatic disease in the chest, abdomen or pelvis.  All is stable so I reassured her that with the result of the PET scan that her treatment is still working and if her CA 19.9 level continues to rise we can do a repeat scan later.  She is continues Ibrance  for 6 weeks on and 1 week off but only takes it every other day. She has a low WBC of 2.9 with an ANC of 1600 down from 3.9 with an ANC of 2400. She has a low hemoglobin of 11.3 improved from 11.2, and platelet count of 249,000. Her CMP is normal other than an mildly elevated creatinine of 1.07. Her CA 27.29 today is pending. She will receive her  Faslodex  and Xgeva  injections today. She will then return in 4 and 8 weeks to receive her Faslodex  and Xgeva  injection. I will see her back in 5 weeks with CBC, CMP, and CA 27.29.   She denies fever, chills, night sweats, or other signs of infection. She denies cardiorespiratory and gastrointestinal issues. Her appetite is wonderful and Her weight has increased 1 pounds over last 2.5 weeks. This patient is accompanied in the office by her husband.    REVIEW OF SYSTEMS:  Review of Systems   Constitutional: Negative.  Negative for appetite change, chills, diaphoresis, fatigue, fever and unexpected weight change.  HENT:  Negative.  Negative for hearing loss, lump/mass, mouth sores, nosebleeds, sore throat, tinnitus, trouble swallowing and voice change.   Eyes: Negative.  Negative for eye problems and icterus.  Respiratory: Negative.  Negative for chest tightness, cough, hemoptysis, shortness of breath and wheezing.   Cardiovascular:  Negative for chest pain, leg swelling and palpitations.  Gastrointestinal: Negative.  Negative for abdominal distention, abdominal pain, blood in stool, constipation, diarrhea, nausea, rectal pain and vomiting.  Endocrine: Negative.   Genitourinary: Negative.  Negative for bladder incontinence, difficulty urinating, dyspareunia, dysuria, frequency, hematuria, menstrual problem, nocturia, pelvic pain, vaginal bleeding and vaginal discharge.   Musculoskeletal:  Positive for  arthralgias, back pain (4/10) and myalgias. Negative for flank pain, gait problem, neck pain and neck stiffness.       Iliac joint/back pain rating 5/10 and right hip pain  Skin: Negative.  Negative for itching, rash and wound.  Neurological:  Negative for dizziness, extremity weakness, gait problem, headaches, light-headedness, numbness, seizures and speech difficulty.  Hematological: Negative.  Negative for adenopathy. Does not bruise/bleed easily.  Psychiatric/Behavioral: Negative.  Negative for confusion, decreased concentration, depression, sleep disturbance and suicidal ideas. The patient is not nervous/anxious.     VITALS:  Blood pressure (!) 140/74, pulse 62, temperature 97.6 F (36.4 C), temperature source Oral, resp. rate 18, height 5' 2 (1.575 m), weight 180 lb 3.2 oz (81.7 kg), SpO2 98%.  Wt Readings from Last 3 Encounters:  11/03/23 180 lb 3.2 oz (81.7 kg)  10/15/23 179 lb 4.8 oz (81.3 kg)  08/11/23 179 lb 14.4 oz (81.6 kg)    Body mass index is 32.96  kg/m.  Performance status (ECOG): 1 - Symptomatic but completely ambulatory  PHYSICAL EXAM:  Physical Exam Vitals and nursing note reviewed. Exam conducted with a chaperone present.  Constitutional:      General: She is not in acute distress.    Appearance: Normal appearance. She is normal weight. She is not ill-appearing, toxic-appearing or diaphoretic.  HENT:     Head: Normocephalic and atraumatic.     Right Ear: Tympanic membrane, ear canal and external ear normal. There is no impacted cerumen.     Left Ear: Tympanic membrane, ear canal and external ear normal. There is no impacted cerumen.     Nose: Nose normal. No congestion or rhinorrhea.     Mouth/Throat:     Mouth: Mucous membranes are moist.     Pharynx: Oropharynx is clear. No oropharyngeal exudate or posterior oropharyngeal erythema.  Eyes:     General: No scleral icterus.       Right eye: No discharge.        Left eye: No discharge.     Extraocular Movements: Extraocular movements intact.     Conjunctiva/sclera: Conjunctivae normal.     Pupils: Pupils are equal, round, and reactive to light.  Neck:     Vascular: No carotid bruit.  Cardiovascular:     Rate and Rhythm: Normal rate and regular rhythm.     Pulses: Normal pulses.     Heart sounds: Normal heart sounds. No murmur heard.    No friction rub. No gallop.  Pulmonary:     Effort: Pulmonary effort is normal. No respiratory distress.     Breath sounds: Normal breath sounds. No stridor. No wheezing, rhonchi or rales.  Chest:     Chest wall: No tenderness.     Comments: Right mastectomy is negative Left breast has no masses  Abdominal:     General: Bowel sounds are normal. There is no distension.     Palpations: Abdomen is soft. There is no hepatomegaly, splenomegaly or mass.     Tenderness: There is no abdominal tenderness. There is no right CVA tenderness, left CVA tenderness, guarding or rebound.     Hernia: No hernia is present.     Comments: Liver feels  normal  Musculoskeletal:        General: No swelling, deformity or signs of injury. Normal range of motion.     Cervical back: Normal range of motion and neck supple. No rigidity or tenderness.     Right lower leg: No edema.  Left lower leg: No edema.     Comments: Persistent induration of the popliteal and upper left calf area  Lymphadenopathy:     Cervical: No cervical adenopathy.     Right cervical: No superficial, deep or posterior cervical adenopathy.    Left cervical: No superficial, deep or posterior cervical adenopathy.     Upper Body:     Right upper body: No supraclavicular, axillary or pectoral adenopathy.     Left upper body: No supraclavicular, axillary or pectoral adenopathy.  Skin:    General: Skin is warm and dry.     Coloration: Skin is not jaundiced or pale.     Findings: No bruising, erythema, lesion or rash.  Neurological:     General: No focal deficit present.     Mental Status: She is alert and oriented to person, place, and time. Mental status is at baseline.     Cranial Nerves: No cranial nerve deficit.     Sensory: No sensory deficit.     Motor: No weakness.     Coordination: Coordination normal.     Gait: Gait normal.     Deep Tendon Reflexes: Reflexes normal.  Psychiatric:        Mood and Affect: Mood normal.        Behavior: Behavior normal.        Thought Content: Thought content normal.        Judgment: Judgment normal.    LABS:      Latest Ref Rng & Units 11/03/2023    2:24 PM 10/15/2023   10:57 AM 10/06/2023   10:29 AM  CBC  WBC 4.0 - 10.5 K/uL 2.9  3.9  3.0   Hemoglobin 12.0 - 15.0 g/dL 88.6  88.7  88.9   Hematocrit 36.0 - 46.0 % 34.6  33.9  32.7   Platelets 150 - 400 K/uL 249  274  238       Latest Ref Rng & Units 11/03/2023    2:24 PM 10/06/2023   10:29 AM 09/08/2023   10:06 AM  CMP  Glucose 70 - 99 mg/dL 887  888  898   BUN 8 - 23 mg/dL 19  16  16    Creatinine 0.44 - 1.00 mg/dL 8.92  9.07  9.11   Sodium 135 - 145 mmol/L 135  132   132   Potassium 3.5 - 5.1 mmol/L 4.1  4.1  4.1   Chloride 98 - 111 mmol/L 98  97  98   CO2 22 - 32 mmol/L 23  23  23    Calcium  8.9 - 10.3 mg/dL 9.4  9.5  89.5   Total Protein 6.5 - 8.1 g/dL 6.9  6.9  6.7   Total Bilirubin 0.0 - 1.2 mg/dL 0.4  0.4  0.5   Alkaline Phos 38 - 126 U/L 77  67  64   AST 15 - 41 U/L 31  22  21    ALT 0 - 44 U/L 19  13  13     Component Ref Range & Units (hover) 8 d ago (10/06/23) 1 mo ago (09/08/23) 2 mo ago (08/11/23) 3 mo ago (07/14/23) 3 mo ago (07/06/23) 4 mo ago (06/16/23) 4 mo ago (05/19/23)  CA 27.29 52.4 High  47.4 High  CM 42.9 High  CM 35.3 CM 36.0 CM 38.1 CM 39.2 High  CM   No results found for: CEA1, CEA / No results found for: CEA1, CEA No results found for: PSA1 No results found for: CAN199 No  results found for: CAN125  No results found for: TOTALPROTELP, ALBUMINELP, A1GS, A2GS, BETS, BETA2SER, GAMS, MSPIKE, SPEI No results found for: TIBC, FERRITIN, IRONPCTSAT Lab Results  Component Value Date   LDH 138 01/17/2011   LDH 155 12/28/2009   LDH 155 12/29/2008   STUDIES:  EXAM: 10/20/2023 NUCLEAR MEDICINE PET SKULL BASE TO THIGH IMPRESSION: 1. Status post right mastectomy. No findings for recurrent tumor. 2. Diffuse sclerotic osseous metastatic disease without focal hypermetabolism to suggest active disease. 3. No findings for visceral metastatic disease in the chest, abdomen or pelvis. 4. Aortic atherosclerosis.     Exam: 07/22/2023 MRI of the Lumbar Spine Impression: Numerous osseous lesions throughout the lower spine, lumbar spine, and pelvis in keeping history of metastatic breast cancer. No pathologic fracture of the lumbar spine or scrum.  Multilevel degenerative disc and facet changes of the lumbar spine.  At L4-L5, moderate spinal canal stenosis and mild to moderate bilateral foraminal stenosis.  At L2-L3 and L3-L4, mild spinal canal and bilateral foraminal stenosis.  At L5-S1, mild  bilateral foraminal stenosis.    MRI of the cervical spine  Impression: Anterior cervical fusion at C3-4 and C5-6. At C3-4 there is a severe left facet arthropathy and moderate bilateral foraminal stenosis and at C5-6 there is a moderate left foraminal stenosis.  C4-5 there has a mild bilateral foraminal stenosis and mild stenosis and C6-7 has a moderate bilateral foraminal stenosis and mild spinal stenosis.  EXAM: 05/26/2023 DIGITAL SCREENING UNILATERAL LEFT MAMMOGRAM WITH CAD AND TOMOSYNTHESIS IMPRESSION: No mammographic evidence of malignancy.     HISTORY:   Past Medical History:  Diagnosis Date   Anxiety    Cancer [facility])    Breast 1997 right tx with mastectomy and chemo, metastatic now   GERD (gastroesophageal reflux disease)    Hypercholesterolemia    Hypertension    Hypothyroidism    Osteoarthritis    oa   Personal history of chemotherapy    Personal history of radiation therapy    Secondary malignant neoplasm of bone and bone marrow (HCC)    TIA (transient ischemic attack) last 09/10/20   x 3 total, they seem to occur every 5 years    Past Surgical History:  Procedure Laterality Date   CATARACT EXTRACTION, BILATERAL     CERVICAL FUSION  2020   MASTECTOMY MODIFIED RADICAL Right 1997   porta cath insertion  1997   later removed   radiation tx  finished 02-25-16   x 20 tx   TONSILLECTOMY     TOTAL HIP ARTHROPLASTY Left 03/12/2016   Procedure: LEFT TOTAL HIP ARTHROPLASTY ANTERIOR APPROACH;  Surgeon: Dempsey Moan, Mooney;  Location: [facility];  Service: Orthopedics;  Laterality: Left;   TOTAL HIP ARTHROPLASTY Right 04/13/2023   Procedure: TOTAL HIP ARTHROPLASTY ANTERIOR APPROACH;  Surgeon: Moan Dempsey, Mooney;  Location: [facility];  Service: Orthopedics;  Laterality: Right;   TOTAL KNEE ARTHROPLASTY Left    TOTAL KNEE ARTHROPLASTY Right 10/30/2021   Procedure: TOTAL KNEE ARTHROPLASTY;  Surgeon: Gerome Charleston, Mooney;  Location: [facility];  Service: Orthopedics;  Laterality: Right;   adductor canal 120   TUBAL LIGATION      Family History  Problem Relation Age of Onset   Breast cancer Maternal Aunt     Social History:  reports that she has never smoked. She has never used smokeless tobacco. She reports current alcohol use of about 1.0 standard drink of alcohol per week. She reports that she does not use drugs.The patient is accompanied by  her husband today.  Allergies:  Allergies  Allergen Reactions   Oxycodone-Aspirin  Nausea And Vomiting, Nausea Only and Other (See Comments)    aspirin  / oxycodone   Other Nausea And Vomiting, Other (See Comments) and Rash   Oxycodone-Acetaminophen  Other (See Comments)   Erythromycin Nausea Only and Other (See Comments)   Oxycodone Other (See Comments)    oxycodone   Oxycodone Hcl Nausea And Vomiting   Tramadol  Itching   Amoxicillin Hives and Dermatitis    Has patient had a PCN reaction causing immediate rash, facial/tongue/throat swelling, SOB or lightheadedness with hypotension:unsure  Has patient had a PCN reaction causing severe rash involving mucus membranes or skin necrosis:No  Has patient had a PCN reaction that required hospitalization:No  Has patient had a PCN reaction occurring within the last 10 years: Yes  If all of the above answers are NO, then may proceed with Cephalosporin use.  Has patient had a PCN reaction causing immediate rash, facial/tongue/throat swelling, SOB or lightheadedness with hypotension:unsure, Has patient had a PCN reaction causing severe rash involving mucus membranes or skin necrosis:No, Has patient had a PCN reaction that required hospitalization:No, Has patient had a PCN reaction occurring within the last 10 years: Yes, If all of the above answers are NO, then may proceed with Cephalosporin use.    Current Medications: Current Outpatient Medications  Medication Sig Dispense Refill   acetaminophen  (TYLENOL ) 500 MG tablet Take 1,000 mg by mouth every 6 (six) hours as needed for mild  pain.     acidophilus (RISAQUAD) CAPS capsule Take 1 capsule by mouth daily.     ascorbic acid (VITAMIN C) 1000 MG tablet Take 1,000 mg by mouth daily.     aspirin  325 MG tablet Take 325 mg by mouth daily.     beta carotene 15 MG capsule Take 15 mg by mouth daily.     Biotin 5000 MCG TABS Take 5,000 mcg by mouth daily.     CALCIUM  CITRATE-VITAMIN D PO Take 1 tablet by mouth in the morning, at noon, in the evening, and at bedtime.     clopidogrel  (PLAVIX ) 75 MG tablet Take 1 tablet (75 mg total) by mouth daily.     cyanocobalamin  (,VITAMIN B-12,) 1000 MCG/ML injection Inject 1,000 mcg into the muscle every 30 (thirty) days.     Denosumab  (XGEVA  [place]) Inject 1 Dose into the skin every 30 (thirty) days.     famotidine  (PEPCID ) 40 MG tablet Take 40 mg by mouth daily.     glucosamine-chondroitin 500-400 MG tablet Take 1 tablet by mouth daily.     ketotifen (ZADITOR) 0.025 % ophthalmic solution Place 1 drop into both eyes daily as needed (allergies).     levothyroxine  (SYNTHROID , LEVOTHROID) 88 MCG tablet Take 88 mcg by mouth daily before breakfast.     LORazepam  (ATIVAN ) 1 MG tablet Take 1 tablet (1 mg total) by mouth every 8 (eight) hours as needed for anxiety. 30 tablet 0   losartan  (COZAAR ) 25 MG tablet Take 25 mg by mouth daily.     Lutein 20 MG CAPS Take 20 mg by mouth daily.     meclizine  (ANTIVERT ) 25 MG tablet Take 25 mg by mouth 3 (three) times daily as needed for dizziness.     melatonin 5 MG TABS Take 5 mg by mouth at bedtime as needed (sleep).     metroNIDAZOLE  (METROCREAM ) 0.75 % cream Apply 1 Application topically daily.     Multiple Vitamins-Minerals (MULTIVITAMIN WITH MINERALS) tablet Take 1 tablet  by mouth daily.     Omega-3 1000 MG CAPS Take 1,000 mg by mouth in the morning and at bedtime.     palbociclib  (IBRANCE ) 75 MG tablet Take 1 tablet (75 mg total) by mouth daily. Take as directed by Mooney for 21 days on, 7 days off, repeat every 28 days. (Patient taking differently: Take 75 mg  by mouth every other day. Take as directed by Mooney for 21 days on, 7 days off, repeat every 28 days.) 21 tablet 5   pantoprazole  (PROTONIX ) 40 MG tablet Take 40 mg by mouth daily.     Polyethyl Glycol-Propyl Glycol 0.4-0.3 % SOLN Place 1-2 drops into both eyes 2 (two) times daily.     rOPINIRole  (REQUIP ) 2 MG tablet Take 4 mg by mouth at bedtime.     rosuvastatin  (CRESTOR ) 20 MG tablet Take 1 tablet (20 mg total) by mouth daily.     triamcinolone  cream (KENALOG ) 0.1 % Apply topically 2 (two) times daily as needed.     vitamin E 180 MG (400 UNITS) capsule Take 400 Units by mouth daily.     zinc gluconate 50 MG tablet Take 50 mg by mouth daily.     No current facility-administered medications for this visit.    I,Jasmine M Lassiter,acting as a scribe for Jade Mooney.,have documented all relevant documentation on the behalf of Jade Mooney,as directed by  Jade Mooney while in the presence of Jade Mooney.

## 2023-11-02 ENCOUNTER — Encounter (HOSPITAL_COMMUNITY): Admission: RE | Admit: 2023-11-02 | Source: Ambulatory Visit

## 2023-11-02 ENCOUNTER — Encounter (HOSPITAL_COMMUNITY)

## 2023-11-03 ENCOUNTER — Encounter: Payer: Self-pay | Admitting: Oncology

## 2023-11-03 ENCOUNTER — Inpatient Hospital Stay

## 2023-11-03 ENCOUNTER — Inpatient Hospital Stay: Attending: Oncology | Admitting: Oncology

## 2023-11-03 ENCOUNTER — Telehealth: Payer: Self-pay | Admitting: Oncology

## 2023-11-03 VITALS — BP 140/74 | HR 62 | Temp 97.6°F | Resp 18 | Ht 62.0 in | Wt 180.2 lb

## 2023-11-03 DIAGNOSIS — C50919 Malignant neoplasm of unspecified site of unspecified female breast: Secondary | ICD-10-CM | POA: Diagnosis not present

## 2023-11-03 DIAGNOSIS — Z5111 Encounter for antineoplastic chemotherapy: Secondary | ICD-10-CM | POA: Insufficient documentation

## 2023-11-03 DIAGNOSIS — C7951 Secondary malignant neoplasm of bone: Secondary | ICD-10-CM | POA: Insufficient documentation

## 2023-11-03 DIAGNOSIS — C50912 Malignant neoplasm of unspecified site of left female breast: Secondary | ICD-10-CM

## 2023-11-03 LAB — CMP (CANCER CENTER ONLY)
ALT: 19 U/L (ref 0–44)
AST: 31 U/L (ref 15–41)
Albumin: 4.5 g/dL (ref 3.5–5.0)
Alkaline Phosphatase: 77 U/L (ref 38–126)
Anion gap: 13 (ref 5–15)
BUN: 19 mg/dL (ref 8–23)
CO2: 23 mmol/L (ref 22–32)
Calcium: 9.4 mg/dL (ref 8.9–10.3)
Chloride: 98 mmol/L (ref 98–111)
Creatinine: 1.07 mg/dL — ABNORMAL HIGH (ref 0.44–1.00)
GFR, Estimated: 53 mL/min — ABNORMAL LOW (ref >60)
Glucose, Bld: 112 mg/dL — ABNORMAL HIGH (ref 70–99)
Potassium: 4.1 mmol/L (ref 3.5–5.1)
Sodium: 135 mmol/L (ref 135–145)
Total Bilirubin: 0.4 mg/dL (ref 0.0–1.2)
Total Protein: 6.9 g/dL (ref 6.5–8.1)

## 2023-11-03 LAB — CBC WITH DIFFERENTIAL (CANCER CENTER ONLY)
Abs Immature Granulocytes: 0.02 K/uL (ref 0.00–0.07)
Basophils Absolute: 0.1 K/uL (ref 0.0–0.1)
Basophils Relative: 2 %
Eosinophils Absolute: 0.1 K/uL (ref 0.0–0.5)
Eosinophils Relative: 5 %
HCT: 34.6 % — ABNORMAL LOW (ref 36.0–46.0)
Hemoglobin: 11.3 g/dL — ABNORMAL LOW (ref 12.0–15.0)
Immature Granulocytes: 1 %
Lymphocytes Relative: 25 %
Lymphs Abs: 0.7 K/uL (ref 0.7–4.0)
MCH: 29.3 pg (ref 26.0–34.0)
MCHC: 32.7 g/dL (ref 30.0–36.0)
MCV: 89.6 fL (ref 80.0–100.0)
Monocytes Absolute: 0.4 K/uL (ref 0.1–1.0)
Monocytes Relative: 13 %
Neutro Abs: 1.6 K/uL — ABNORMAL LOW (ref 1.7–7.7)
Neutrophils Relative %: 54 %
Platelet Count: 249 K/uL (ref 150–400)
RBC: 3.86 MIL/uL — ABNORMAL LOW (ref 3.87–5.11)
RDW: 16.2 % — ABNORMAL HIGH (ref 11.5–15.5)
WBC Count: 2.9 K/uL — ABNORMAL LOW (ref 4.0–10.5)
nRBC: 0 % (ref 0.0–0.2)

## 2023-11-03 MED ORDER — FULVESTRANT 250 MG/5ML IM SOSY
500.0000 mg | PREFILLED_SYRINGE | Freq: Once | INTRAMUSCULAR | Status: AC
Start: 2023-11-03 — End: 2023-11-03
  Administered 2023-11-03 (×2): 500 mg via INTRAMUSCULAR
  Filled 2023-11-03: qty 10

## 2023-11-03 MED ORDER — DENOSUMAB 120 MG/1.7ML ~~LOC~~ SOLN
120.0000 mg | Freq: Once | SUBCUTANEOUS | Status: AC
Start: 2023-11-03 — End: 2023-11-03
  Administered 2023-11-03 (×2): 120 mg via SUBCUTANEOUS
  Filled 2023-11-03: qty 1.7

## 2023-11-03 NOTE — Telephone Encounter (Signed)
 Patient has been scheduled for follow-up visit per 11/03/23 LOS.  Pt given an appt calendar with date and time.

## 2023-11-03 NOTE — Patient Instructions (Addendum)
 Fulvestrant Injection What is this medication? FULVESTRANT (ful VES trant) treats breast cancer. It works by blocking the hormone estrogen in breast tissue, which prevents breast cancer cells from spreading or growing. This medicine may be used for other purposes; ask your health care provider or pharmacist if you have questions. COMMON BRAND NAME(S): FASLODEX What should I tell my care team before I take this medication? They need to know if you have any of these conditions: Bleeding disorder Liver disease Low blood cell levels (white cells, red cells, and platelets) An unusual or allergic reaction to fulvestrant, other medications, foods, dyes, or preservatives Pregnant or trying to get pregnant Breastfeeding How should I use this medication? This medication is injected into a muscle. It is given by your care team in a hospital or clinic setting. Talk to your care team about the use of this medication in children. Special care may be needed. Overdosage: If you think you have taken too much of this medicine contact a poison control center or emergency room at once. NOTE: This medicine is only for you. Do not share this medicine with others. What if I miss a dose? Keep appointments for follow-up doses. It is important not to miss your dose. Call your care team if you are unable to keep an appointment. What may interact with this medication? Fluoroestradiol F18 This list may not describe all possible interactions. Give your health care provider a list of all the medicines, herbs, non-prescription drugs, or dietary supplements you use. Also tell them if you smoke, drink alcohol, or use illegal drugs. Some items may interact with your medicine. What should I watch for while using this medication? Your condition will be monitored carefully while you are receiving this medication. You may need blood work done while you are taking this medication. This medication is injected into a muscle. Talk  to your care team if you also take medications that prevent or treat blood clots, such as warfarin. Blood thinners may increase the risk of bleeding or bruising in the muscle where this medication is injected. The benefits of this medication may outweigh the risks. Your care team can help you find the option that works for you. They can also help limit the risk of bleeding. Talk to your care team if you may be pregnant. Serious birth defects can occur if you take this medication during pregnancy and for 1 year after the last dose. You will need a negative pregnancy test before starting this medication. Contraception is recommended while taking this medication and for 1 year after the last dose. Your care team can help you find the option that works for you. Do not breastfeed while taking this medication and for 1 year after the last dose. This medication may cause infertility. Talk to your care team if you are concerned about your fertility. What side effects may I notice from receiving this medication? Side effects that you should report to your care team as soon as possible: Allergic reactions or angioedema--skin rash, itching or hives, swelling of the face, eyes, lips, tongue, arms, or legs, trouble swallowing or breathing Pain, tingling, or numbness in the hands or feet Side effects that usually do not require medical attention (report to your care team if they continue or are bothersome): Bone, joint, or muscle pain Constipation Headache Hot flashes Nausea Pain, redness, or irritation at injection site Unusual weakness or fatigue This list may not describe all possible side effects. Call your doctor for medical advice about side  effects. You may report side effects to FDA at 1-800-FDA-1088. Where should I keep my medication? This medication is given in a hospital or clinic. It will not be stored at home. NOTE: This sheet is a summary. It may not cover all possible information. If you have  questions about this medicine, talk to your doctor, pharmacist, or health care provider.  2024 Elsevier/Gold Standard (2022-11-14 00:00:00)Denosumab Injection (Oncology) What is this medication? DENOSUMAB (den oh SUE mab) prevents weakened bones caused by cancer. It may also be used to treat noncancerous bone tumors that cannot be removed by surgery. It can also be used to treat high calcium levels in the blood caused by cancer. It works by blocking a protein that causes bones to break down quickly. This slows down the release of calcium from bones, which lowers calcium levels in your blood. It also makes your bones stronger and less likely to break (fracture). This medicine may be used for other purposes; ask your health care provider or pharmacist if you have questions. COMMON BRAND NAME(S): XGEVA What should I tell my care team before I take this medication? They need to know if you have any of these conditions: Dental disease Having surgery or tooth extraction Infection Kidney disease Low levels of calcium or vitamin D in the blood Malnutrition On hemodialysis Skin conditions or sensitivity Thyroid or parathyroid disease An unusual reaction to denosumab, other medications, foods, dyes, or preservatives Pregnant or trying to get pregnant Breast-feeding How should I use this medication? This medication is for injection under the skin. It is given by your care team in a hospital or clinic setting. A special MedGuide will be given to you before each treatment. Be sure to read this information carefully each time. Talk to your care team about the use of this medication in children. While it may be prescribed for children as young as 13 years for selected conditions, precautions do apply. Overdosage: If you think you have taken too much of this medicine contact a poison control center or emergency room at once. NOTE: This medicine is only for you. Do not share this medicine with others. What  if I miss a dose? Keep appointments for follow-up doses. It is important not to miss your dose. Call your care team if you are unable to keep an appointment. What may interact with this medication? Do not take this medication with any of the following: Other medications containing denosumab This medication may also interact with the following: Medications that lower your chance of fighting infection Steroid medications, such as prednisone or cortisone This list may not describe all possible interactions. Give your health care provider a list of all the medicines, herbs, non-prescription drugs, or dietary supplements you use. Also tell them if you smoke, drink alcohol, or use illegal drugs. Some items may interact with your medicine. What should I watch for while using this medication? Your condition will be monitored carefully while you are receiving this medication. You may need blood work while taking this medication. This medication may increase your risk of getting an infection. Call your care team for advice if you get a fever, chills, sore throat, or other symptoms of a cold or flu. Do not treat yourself. Try to avoid being around people who are sick. You should make sure you get enough calcium and vitamin D while you are taking this medication, unless your care team tells you not to. Discuss the foods you eat and the vitamins you take with your  care team. Some people who take this medication have severe bone, joint, or muscle pain. This medication may also increase your risk for jaw problems or a broken thigh bone. Tell your care team right away if you have severe pain in your jaw, bones, joints, or muscles. Tell your care team if you have any pain that does not go away or that gets worse. Talk to your care team if you may be pregnant. Serious birth defects can occur if you take this medication during pregnancy and for 5 months after the last dose. You will need a negative pregnancy test before  starting this medication. Contraception is recommended while taking this medication and for 5 months after the last dose. Your care team can help you find the option that works for you. What side effects may I notice from receiving this medication? Side effects that you should report to your care team as soon as possible: Allergic reactions--skin rash, itching, hives, swelling of the face, lips, tongue, or throat Bone, joint, or muscle pain Low calcium level--muscle pain or cramps, confusion, tingling, or numbness in the hands or feet Osteonecrosis of the jaw--pain, swelling, or redness in the mouth, numbness of the jaw, poor healing after dental work, unusual discharge from the mouth, visible bones in the mouth Side effects that usually do not require medical attention (report to your care team if they continue or are bothersome): Cough Diarrhea Fatigue Headache Nausea This list may not describe all possible side effects. Call your doctor for medical advice about side effects. You may report side effects to FDA at 1-800-FDA-1088. Where should I keep my medication? This medication is given in a hospital or clinic. It will not be stored at home. NOTE: This sheet is a summary. It may not cover all possible information. If you have questions about this medicine, talk to your doctor, pharmacist, or health care provider.  2024 Elsevier/Gold Standard (2021-07-31 00:00:00)

## 2023-11-04 LAB — CANCER ANTIGEN 27.29: CA 27.29: 69.9 U/mL — ABNORMAL HIGH (ref 0.0–38.6)

## 2023-11-05 DIAGNOSIS — E039 Hypothyroidism, unspecified: Secondary | ICD-10-CM | POA: Diagnosis not present

## 2023-11-05 DIAGNOSIS — Z789 Other specified health status: Secondary | ICD-10-CM | POA: Diagnosis not present

## 2023-11-05 DIAGNOSIS — E538 Deficiency of other specified B group vitamins: Secondary | ICD-10-CM | POA: Diagnosis not present

## 2023-11-05 DIAGNOSIS — E785 Hyperlipidemia, unspecified: Secondary | ICD-10-CM | POA: Diagnosis not present

## 2023-11-11 ENCOUNTER — Encounter: Payer: Self-pay | Admitting: Oncology

## 2023-11-11 DIAGNOSIS — G2581 Restless legs syndrome: Secondary | ICD-10-CM | POA: Diagnosis not present

## 2023-11-11 DIAGNOSIS — M8589 Other specified disorders of bone density and structure, multiple sites: Secondary | ICD-10-CM | POA: Diagnosis not present

## 2023-11-11 DIAGNOSIS — E782 Mixed hyperlipidemia: Secondary | ICD-10-CM | POA: Diagnosis not present

## 2023-11-11 DIAGNOSIS — Z0000 Encounter for general adult medical examination without abnormal findings: Secondary | ICD-10-CM | POA: Diagnosis not present

## 2023-11-11 DIAGNOSIS — J069 Acute upper respiratory infection, unspecified: Secondary | ICD-10-CM | POA: Diagnosis not present

## 2023-11-11 DIAGNOSIS — M8580 Other specified disorders of bone density and structure, unspecified site: Secondary | ICD-10-CM | POA: Diagnosis not present

## 2023-12-01 ENCOUNTER — Inpatient Hospital Stay: Attending: Oncology

## 2023-12-01 ENCOUNTER — Inpatient Hospital Stay

## 2023-12-01 ENCOUNTER — Other Ambulatory Visit: Payer: Self-pay

## 2023-12-01 VITALS — BP 165/85 | HR 59 | Resp 18 | Ht 62.0 in | Wt 182.2 lb

## 2023-12-01 DIAGNOSIS — C50919 Malignant neoplasm of unspecified site of unspecified female breast: Secondary | ICD-10-CM | POA: Insufficient documentation

## 2023-12-01 DIAGNOSIS — Z1721 Progesterone receptor positive status: Secondary | ICD-10-CM | POA: Diagnosis not present

## 2023-12-01 DIAGNOSIS — C7951 Secondary malignant neoplasm of bone: Secondary | ICD-10-CM | POA: Diagnosis present

## 2023-12-01 DIAGNOSIS — Z170 Estrogen receptor positive status [ER+]: Secondary | ICD-10-CM | POA: Insufficient documentation

## 2023-12-01 DIAGNOSIS — Z5111 Encounter for antineoplastic chemotherapy: Secondary | ICD-10-CM | POA: Diagnosis present

## 2023-12-01 DIAGNOSIS — C50912 Malignant neoplasm of unspecified site of left female breast: Secondary | ICD-10-CM

## 2023-12-01 LAB — CMP (CANCER CENTER ONLY)
ALT: 14 U/L (ref 0–44)
AST: 21 U/L (ref 15–41)
Albumin: 4.6 g/dL (ref 3.5–5.0)
Alkaline Phosphatase: 60 U/L (ref 38–126)
Anion gap: 14 (ref 5–15)
BUN: 20 mg/dL (ref 8–23)
CO2: 22 mmol/L (ref 22–32)
Calcium: 9.5 mg/dL (ref 8.9–10.3)
Chloride: 99 mmol/L (ref 98–111)
Creatinine: 1.19 mg/dL — ABNORMAL HIGH (ref 0.44–1.00)
GFR, Estimated: 47 mL/min — ABNORMAL LOW (ref >60)
Glucose, Bld: 115 mg/dL — ABNORMAL HIGH (ref 70–99)
Potassium: 3.9 mmol/L (ref 3.5–5.1)
Sodium: 134 mmol/L — ABNORMAL LOW (ref 135–145)
Total Bilirubin: 0.4 mg/dL (ref 0.0–1.2)
Total Protein: 6.7 g/dL (ref 6.5–8.1)

## 2023-12-01 LAB — CBC WITH DIFFERENTIAL (CANCER CENTER ONLY)
Abs Immature Granulocytes: 0.01 K/uL (ref 0.00–0.07)
Basophils Absolute: 0.1 K/uL (ref 0.0–0.1)
Basophils Relative: 2 %
Eosinophils Absolute: 0.1 K/uL (ref 0.0–0.5)
Eosinophils Relative: 4 %
HCT: 33.6 % — ABNORMAL LOW (ref 36.0–46.0)
Hemoglobin: 11.1 g/dL — ABNORMAL LOW (ref 12.0–15.0)
Immature Granulocytes: 0 %
Lymphocytes Relative: 28 %
Lymphs Abs: 0.8 K/uL (ref 0.7–4.0)
MCH: 29.8 pg (ref 26.0–34.0)
MCHC: 33 g/dL (ref 30.0–36.0)
MCV: 90.1 fL (ref 80.0–100.0)
Monocytes Absolute: 0.4 K/uL (ref 0.1–1.0)
Monocytes Relative: 16 %
Neutro Abs: 1.4 K/uL — ABNORMAL LOW (ref 1.7–7.7)
Neutrophils Relative %: 50 %
Platelet Count: 210 K/uL (ref 150–400)
RBC: 3.73 MIL/uL — ABNORMAL LOW (ref 3.87–5.11)
RDW: 15.3 % (ref 11.5–15.5)
WBC Count: 2.7 K/uL — ABNORMAL LOW (ref 4.0–10.5)
nRBC: 0 % (ref 0.0–0.2)

## 2023-12-01 MED ORDER — FULVESTRANT 250 MG/5ML IM SOSY
500.0000 mg | PREFILLED_SYRINGE | Freq: Once | INTRAMUSCULAR | Status: AC
  Administered 2023-12-01: 500 mg via INTRAMUSCULAR
  Filled 2023-12-01: qty 10

## 2023-12-01 MED ORDER — DENOSUMAB 120 MG/1.7ML ~~LOC~~ SOLN
120.0000 mg | Freq: Once | SUBCUTANEOUS | Status: AC
  Administered 2023-12-01: 120 mg via SUBCUTANEOUS
  Filled 2023-12-01: qty 1.7

## 2023-12-01 NOTE — Progress Notes (Signed)
 Specialty Pharmacy Refill Coordination Note  Jade Mooney is a 78 y.o. female contacted today regarding refills of specialty medication(s) Palbociclib  (IBRANCE )   Patient requested Delivery   Delivery date: 12/04/23   Verified address: [address] [place] [address]   Medication will be filled on 12/03/23.

## 2023-12-01 NOTE — Patient Instructions (Signed)
 Denosumab Injection (Oncology) What is this medication? DENOSUMAB (den oh SUE mab) prevents weakened bones caused by cancer. It may also be used to treat noncancerous bone tumors that cannot be removed by surgery. It can also be used to treat high calcium levels in the blood caused by cancer. It works by blocking a protein that causes bones to break down quickly. This slows down the release of calcium from bones, which lowers calcium levels in your blood. It also makes your bones stronger and less likely to break (fracture). This medicine may be used for other purposes; ask your health care provider or pharmacist if you have questions. COMMON BRAND NAME(S): XGEVA What should I tell my care team before I take this medication? They need to know if you have any of these conditions: Dental disease Having surgery or tooth extraction Infection Kidney disease Low levels of calcium or vitamin D in the blood Malnutrition On hemodialysis Skin conditions or sensitivity Thyroid or parathyroid disease An unusual reaction to denosumab, other medications, foods, dyes, or preservatives Pregnant or trying to get pregnant Breast-feeding How should I use this medication? This medication is for injection under the skin. It is given by your care team in a hospital or clinic setting. A special MedGuide will be given to you before each treatment. Be sure to read this information carefully each time. Talk to your care team about the use of this medication in children. While it may be prescribed for children as young as 13 years for selected conditions, precautions do apply. Overdosage: If you think you have taken too much of this medicine contact a poison control center or emergency room at once. NOTE: This medicine is only for you. Do not share this medicine with others. What if I miss a dose? Keep appointments for follow-up doses. It is important not to miss your dose. Call your care team if you are unable to  keep an appointment. What may interact with this medication? Do not take this medication with any of the following: Other medications containing denosumab This medication may also interact with the following: Medications that lower your chance of fighting infection Steroid medications, such as prednisone or cortisone This list may not describe all possible interactions. Give your health care provider a list of all the medicines, herbs, non-prescription drugs, or dietary supplements you use. Also tell them if you smoke, drink alcohol, or use illegal drugs. Some items may interact with your medicine. What should I watch for while using this medication? Your condition will be monitored carefully while you are receiving this medication. You may need blood work while taking this medication. This medication may increase your risk of getting an infection. Call your care team for advice if you get a fever, chills, sore throat, or other symptoms of a cold or flu. Do not treat yourself. Try to avoid being around people who are sick. You should make sure you get enough calcium and vitamin D while you are taking this medication, unless your care team tells you not to. Discuss the foods you eat and the vitamins you take with your care team. Some people who take this medication have severe bone, joint, or muscle pain. This medication may also increase your risk for jaw problems or a broken thigh bone. Tell your care team right away if you have severe pain in your jaw, bones, joints, or muscles. Tell your care team if you have any pain that does not go away or that gets worse. Talk  to your care team if you may be pregnant. Serious birth defects can occur if you take this medication during pregnancy and for 5 months after the last dose. You will need a negative pregnancy test before starting this medication. Contraception is recommended while taking this medication and for 5 months after the last dose. Your care team  can help you find the option that works for you. What side effects may I notice from receiving this medication? Side effects that you should report to your care team as soon as possible: Allergic reactions--skin rash, itching, hives, swelling of the face, lips, tongue, or throat Bone, joint, or muscle pain Low calcium level--muscle pain or cramps, confusion, tingling, or numbness in the hands or feet Osteonecrosis of the jaw--pain, swelling, or redness in the mouth, numbness of the jaw, poor healing after dental work, unusual discharge from the mouth, visible bones in the mouth Side effects that usually do not require medical attention (report to your care team if they continue or are bothersome): Cough Diarrhea Fatigue Headache Nausea This list may not describe all possible side effects. Call your doctor for medical advice about side effects. You may report side effects to FDA at 1-800-FDA-1088. Where should I keep my medication? This medication is given in a hospital or clinic. It will not be stored at home. NOTE: This sheet is a summary. It may not cover all possible information. If you have questions about this medicine, talk to your doctor, pharmacist, or health care provider.  2024 [employer]/Gold Standard (2021-07-31 00:00:00)Fulvestrant Injection What is this medication? FULVESTRANT (ful VES trant) treats breast cancer. It works by blocking the hormone estrogen in breast tissue, which prevents breast cancer cells from spreading or growing. This medicine may be used for other purposes; ask your health care provider or pharmacist if you have questions. COMMON BRAND NAME(S): FASLODEX What should I tell my care team before I take this medication? They need to know if you have any of these conditions: Bleeding disorder Liver disease Low blood cell levels (white cells, red cells, and platelets) An unusual or allergic reaction to fulvestrant, other medications, foods, dyes, or  preservatives Pregnant or trying to get pregnant Breastfeeding How should I use this medication? This medication is injected into a muscle. It is given by your care team in a hospital or clinic setting. Talk to your care team about the use of this medication in children. Special care may be needed. Overdosage: If you think you have taken too much of this medicine contact a poison control center or emergency room at once. NOTE: This medicine is only for you. Do not share this medicine with others. What if I miss a dose? Keep appointments for follow-up doses. It is important not to miss your dose. Call your care team if you are unable to keep an appointment. What may interact with this medication? Fluoroestradiol F18 This list may not describe all possible interactions. Give your health care provider a list of all the medicines, herbs, non-prescription drugs, or dietary supplements you use. Also tell them if you smoke, drink alcohol, or use illegal drugs. Some items may interact with your medicine. What should I watch for while using this medication? Your condition will be monitored carefully while you are receiving this medication. You may need blood work done while you are taking this medication. This medication is injected into a muscle. Talk to your care team if you also take medications that prevent or treat blood clots, such as warfarin.  Blood thinners may increase the risk of bleeding or bruising in the muscle where this medication is injected. The benefits of this medication may outweigh the risks. Your care team can help you find the option that works for you. They can also help limit the risk of bleeding. Talk to your care team if you may be pregnant. Serious birth defects can occur if you take this medication during pregnancy and for 1 year after the last dose. You will need a negative pregnancy test before starting this medication. Contraception is recommended while taking this medication  and for 1 year after the last dose. Your care team can help you find the option that works for you. Do not breastfeed while taking this medication and for 1 year after the last dose. This medication may cause infertility. Talk to your care team if you are concerned about your fertility. What side effects may I notice from receiving this medication? Side effects that you should report to your care team as soon as possible: Allergic reactions or angioedema--skin rash, itching or hives, swelling of the face, eyes, lips, tongue, arms, or legs, trouble swallowing or breathing Pain, tingling, or numbness in the hands or feet Side effects that usually do not require medical attention (report to your care team if they continue or are bothersome): Bone, joint, or muscle pain Constipation Headache Hot flashes Nausea Pain, redness, or irritation at injection site Unusual weakness or fatigue This list may not describe all possible side effects. Call your doctor for medical advice about side effects. You may report side effects to FDA at 1-800-FDA-1088. Where should I keep my medication? This medication is given in a hospital or clinic. It will not be stored at home. NOTE: This sheet is a summary. It may not cover all possible information. If you have questions about this medicine, talk to your doctor, pharmacist, or health care provider.  2024 [employer]/Gold Standard (2022-11-14 00:00:00)

## 2023-12-02 ENCOUNTER — Telehealth: Payer: Self-pay

## 2023-12-02 ENCOUNTER — Other Ambulatory Visit: Payer: Self-pay | Admitting: Oncology

## 2023-12-02 ENCOUNTER — Other Ambulatory Visit: Payer: Self-pay

## 2023-12-02 ENCOUNTER — Other Ambulatory Visit (HOSPITAL_COMMUNITY): Payer: Self-pay

## 2023-12-02 DIAGNOSIS — C50919 Malignant neoplasm of unspecified site of unspecified female breast: Secondary | ICD-10-CM

## 2023-12-02 LAB — CANCER ANTIGEN 27.29: CA 27.29: 87.2 U/mL — ABNORMAL HIGH (ref 0.0–38.6)

## 2023-12-02 MED ORDER — ABEMACICLIB 100 MG PO TABS
100.0000 mg | ORAL_TABLET | Freq: Two times a day (BID) | ORAL | 5 refills | Status: AC
  Filled 2023-12-04: qty 56, 28d supply, fill #0
  Filled 2023-12-28: qty 56, 28d supply, fill #1
  Filled 2024-02-01: qty 56, 28d supply, fill #2
  Filled 2024-03-16: qty 56, 28d supply, fill #3
  Filled 2024-04-08: qty 56, 28d supply, fill #4

## 2023-12-02 NOTE — Telephone Encounter (Signed)
 Oral Oncology Patient Advocate Encounter   Received notification that prior authorization for abemaciclib  (VERZENIO ) 100 MG tablet  is required.   PA submitted on 12/02/23 Key [id number] Status is pending     Lucie Lamer, CPhT [place]  [facility] Pharmacy Technician Patient Advocate Specialist [facility] [place] Phone: [phone]  Fax: [phone] [email or web address]

## 2023-12-02 NOTE — Progress Notes (Signed)
 Ibrance  cancelled by provider d/t progression. Cancelled in [place]. Therapy being changed to Verzenio  pending counseling by [place].

## 2023-12-02 NOTE — Telephone Encounter (Signed)
 Oral Oncology Patient Advocate Encounter  Prior Authorization for Verzenio  has been approved.    PA# [id number] Effective dates: 12/02/23 through 03/23/2098  Patients co-pay is $0.00.    Lucie Lamer, CPhT [place]  [facility] Pharmacy Technician Patient Advocate Specialist [facility] [place] Phone: [phone]  Fax: [phone] [email or web address]

## 2023-12-02 NOTE — Progress Notes (Signed)
 [facility]  [address] [place],  [place]  [address] [phone]  Clinic Day:12/08/23  Referring physician: Street, Lonni HERO, *  ASSESSMENT & PLAN:  Assessment: Primary malignant neoplasm of breast with metastasis [facility]) Patient has a complicated oncology history history of invasive ductal carcinoma of the right breast diagnosed in 1997.  She was treated with mastectomy and adjuvant chemotherapy.  She also participated in clinical trial using adjuvant high-dose chemotherapy followed by stem cell transplant.  She was treated with adjuvant tamoxifen for 5 years.  She developed biopsy-proven liver metastasis, estrogen and HER2 receptor positive, in November 2007.  She received trastuzumab for an unknown period of time.  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  She was started on zoledronic  acid in July and anastrozole  in August, 2015.    She developed a metastatic lesion of the right anterior chest in August, 2017 confirmed to be adenocarcinoma and treated with excision and radiation.  Anastrozole  was discontinued and she was switched to letrozole .  Palbociclib  was added in January, 2018 after PET imaging revealed 2 new foci at T8 and the manubrium.  The dose of palbociclib  had to be decreased over time.  Zoledronic  acid was discontinued in January, 2019.  Letrozole  was discontinued due to progression and was started on fulvestrant  in February 2019 due to progression. She received palliative radiation to the cervical spine in February, 2019. Palbociclib  was held during radiation and resumed at 75 mg daily once completed. Denosumab  was started in March, 2019.  She has remained on this regimen without evidence of progression.  A PET in January, 2025 was stable, with evidence of treated bone metastases. She had significant neutropenia with the Ibrance  and so we cannot increase the dose any further. Now with her rising CA  27.29, we suspected her cancer may be becoming resistant and so a PET scan was done on 10/20/2023, which revealed diffuse sclerotic osseous metastatic disease without focal hypermetabolism and no findings for visceral metastatic disease in the chest, abdomen or pelvis. When the CA 27.29 increased from 69.9 to 87.2, we stopped the Ibrance  and changed to Verzenio  100 mg BID along with the fluvestrant injections.   Severe Osteoarthritis/Degenerative Disc Disease  She has already had knee replacement and right hip replacement. 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 She is now undergoing physical therapy with some improvement of her back pain.   Plan:  She informed me that she was thinking about going to [facility] to have her right shoulder pain evaluated and receive a possible cortisone injection. I agreed receiving this would be fine. She was stopped on Ibrance  last week and will start Verzenio  100 mg BID today. We have been unable to increase the Ibrance  dose due to her neutropenia. We have also discussed changing to exemestane and everolimus but will try this approach first.  Her last MRI of the brain was done in July, 2022 but we agreed to hold off on a repeat scan for now. 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 I did review the PET scan images with her and her husband once again. She had labs done on 12/01/2023 which revealed a low WBC of 2.7 with an ANC of 1400, low hemoglobin of 11.1 down from 11.3, and platelet count of 210,000. Her CMP was fairly normal other than  an elevated creatinine of 1.19. Her CA 27.29 has continued to increase and is now at 87.2 up from 69.9 within 1 month's time. Today her CBC and CMP has improved as she has a WBC of 3.1 with an ANC of 1500, hemoglobin of 11.6, platelet count of 219,000 and a low stable sodium of 134. The rest of her CMP is normal. Her CA 27.29  today is pending. She informed me that she has two trips coming up and she may hold her Verzenio  during this time if she has problems with diarrhea. We discussed using imodium as a prophylaxis. She will be leaving September 30th-October 2nd and October 10-17th. She will return for CBC and CMP on the 26th before she leaves and I will see her in 3 weeks with CBC, CMP, CA 27.29, and faslodex  and Xgeva  injection. The patient understands the plans discussed today and is in agreement with them.  She knows to contact our office if she develops concerns prior to her next appointment.  I provided 23 minutes of face-to-face time during this encounter and > 50% was spent counseling as documented under my assessment and plan.   [name], MD [facility] A DEPT OF [facility] [address] [place] [address] Dept: [phone] Dept Fax: [phone]   No orders of the defined types were placed in this encounter.   CHIEF COMPLAINT:  CC: Recurrent hormone receptor positive breast cancer   Current Treatment:  Palbociclib /fulvestrant /denosumab    HISTORY OF PRESENT ILLNESS:  Jade Mooney is a 78 y.o. female who we began seeing in January 2023 for transfer of care from Dr. Sandria Gell for the continued treatment and management of metastatic breast cancer. She was originally diagnosed with stage III hormone receptor positive right breast cancer in 1997.  She was treated with right mastectomy and adjuvant chemotherapy with Taxotere for 4 cycles.  She also participated in a [facility] protocol with high-dose chemotherapy, cyclophosphamide/carboplatin/BCNU, followed by stem cell rescue in September 1997. She completed 5 years of tamoxifen in November 2002.  Unfortunately, she developed metastatic liver lesions, biopsy proven metastatic breast cancer, estrogen receptor positive and HER2 receptor positive, in November 2007.  She was treated  with trastuzumab for an unknown period of time.    In July 2015, she was found to have incidental findings of mixed lytic and sclerotic bony lesions on CT abdomen and pelvis. She underwent biopsy of the right iliac bone and surgical pathology confirmed invasive ductal carcinoma, grade 2, estrogen and progesterone receptor positive. Bone density from May 2015 was normal. She was started on zolendronic acid every 12 weeks in July 2015. She was also found to have three scalp lesions, biopsied in July 2015 confirming metastatic breast cancer. HER2 negative. She was started on anastrozole  in August 2015.   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     Anastrozole  was discontinued and she was switched to letrozole  in April 2018.  Palbociclib  was added in January 2018 after PET imaging revealed 2 new foci at T8 and the manubrium.   Zoledronic  acid was discontinued in January 2019.  Letrozole  was discontinued in February 2019 due to progression. She was started on fulvestrant  in February 2019. She received palliative radiation to the cervical spine in February 2019. Palbociclib  was held during that time and resumed at 75 mg daily once completed.  Denosumab  was started in March 2019.    She has continued on palbociclib  75 mg daily for 3 weeks on and 1 week off, along with fulvestrant  and denosumab  every 4 weeks.  Most recent imaging was a PET scan in December 2023 revealed significant interval change in the diffuse osteoblastic metastatic lesions in the axial and proximal appendicular skeleton without abnormal FDG avidity compatible with chronic treated disease. No evidence of hypermetabolic local recurrent breast cancer or viable distant metastatic disease.    Oncology History  Breast cancer metastasized to bone (HCC)  10/12/2013 Initial Diagnosis   Breast cancer metastasized to bone (HCC)   02/02/2020 -  11/14/2021 Chemotherapy   Patient is on Treatment Plan : BREAST FULVESTRANT  & XGEVA  Q28D     Primary malignant neoplasm of breast with metastasis (HCC)  07/12/2015 Initial Diagnosis   Metastatic breast cancer (HCC)   02/02/2020 - 11/14/2021 Chemotherapy   Patient is on Treatment Plan : BREAST FULVESTRANT  & XGEVA  Q28D       INTERVAL HISTORY:  Jade Mooney is here today for clinical assessment for recurrent hormone receptor positive breast cancer. Patient states that she feels well but complains of occasional chronic right shoulder pain and chronic back pain rating 3/10. She informed me that she was thinking about going to [facility] to have this evaluated and a possible cortisone injection. I agreed receiving this would be fine. She was stopped on Ibrance  last week and will start Verzenio  100 mg BID today. We have been unable to escalate her Ibrance  dose due to neutropenia. Her last MRI of the brain was done in July, 2022 but we agreed to hold off on a repeat scan for now. 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 I did review the PET scan images with her and her husband once again. She had labs done on 12/01/2023 which revealed a low WBC of 2.7 with an ANC of 1400, low hemoglobin of 11.1 down from 11.3, and platelet count of 210,000. Her CMP was fairly normal other than an elevated creatinine of 1.19. Her CA 27.29 has continued to increase and is now at 87.2 up from 69.9 within a months time. Today her CBC and CMP has improved as she has a WBC of 3.1 with an ANC of 1500, hemoglobin of 11.6, platelet count of 219,000 and a low stable sodium of 134. The rest of her CMP is normal. Her CA 27.29 today is pending. She informed me that she has two trips coming up and she may hold her Verzenio  during this time if she has problems with diarrhea. We discussed using imodium as a prophylaxis. She will be leaving September 30th-October 2nd and October 10-17th. She will return for  CBC and CMP on the 26th before she leaves and I will see her in 3 weeks with CBC, CMP, CA 27.29, and faslodex  and Xgeva  injection.   She denies fever, chills, night sweats, or other signs of infection. She denies cardiorespiratory and gastrointestinal issues. Her appetite is wonderful and Her weight has increased 3 pounds over last month. This patient is accompanied in the office by her husband.   REVIEW OF SYSTEMS:  Review of Systems  Constitutional: Negative.  Negative for appetite change, chills, diaphoresis, fatigue, fever and unexpected weight change.  HENT:  Negative.  Negative for hearing loss, lump/mass, mouth sores, nosebleeds, sore throat, tinnitus, trouble swallowing and voice change.   Eyes: Negative.  Negative for eye problems and icterus.  Respiratory: Negative.  Negative for chest tightness, cough, hemoptysis, shortness of breath and wheezing.   Cardiovascular:  Negative for chest pain, leg swelling and palpitations.  Gastrointestinal: Negative.  Negative for abdominal distention, abdominal pain, blood in stool, constipation, diarrhea, nausea, rectal pain and vomiting.  Endocrine: Negative.   Genitourinary: Negative.  Negative for bladder incontinence, difficulty urinating, dyspareunia, dysuria, frequency, hematuria, menstrual problem, nocturia, pelvic pain, vaginal bleeding and vaginal discharge.   Musculoskeletal:  Positive for arthralgias, back pain (3/10) and myalgias. Negative for flank pain, gait problem, neck pain and neck stiffness.       Chronic occasional right shoulder pain  Skin: Negative.  Negative for itching, rash and wound.  Neurological:  Negative for dizziness, extremity weakness, gait problem, headaches, light-headedness, numbness, seizures and speech difficulty.  Hematological: Negative.  Negative for adenopathy. Does not bruise/bleed easily.  Psychiatric/Behavioral: Negative.  Negative for confusion, decreased concentration, depression, sleep disturbance and  suicidal ideas. The patient is not nervous/anxious.     VITALS:  Blood pressure (!) 155/74, pulse 62, temperature 97.6 F (36.4 C), temperature source Oral, resp. rate 18, height 5' 2 (1.575 m), weight 183 lb 8 oz (83.2 kg), SpO2 97%.  Wt Readings from Last 3 Encounters:  12/08/23 183 lb 8 oz (83.2 kg)  12/01/23 182 lb 4 oz (82.7 kg)  11/03/23 180 lb 3.2 oz (81.7 kg)    Body mass index is 33.56 kg/m.  Performance status (ECOG): 1 - Symptomatic but completely ambulatory  PHYSICAL EXAM:  Physical Exam Vitals and nursing note reviewed. Exam conducted with a chaperone present.  Constitutional:      General: She is not in acute distress.    Appearance: Normal appearance. She is normal weight. She is not ill-appearing, toxic-appearing or diaphoretic.  HENT:     Head: Normocephalic and atraumatic.     Right Ear: Tympanic membrane, ear canal and external ear normal. There is no impacted cerumen.     Left Ear: Tympanic membrane, ear canal and external ear normal. There is no impacted cerumen.     Nose: Nose normal. No congestion or rhinorrhea.     Mouth/Throat:     Mouth: Mucous membranes are moist.     Pharynx: Oropharynx is clear. No oropharyngeal exudate or posterior oropharyngeal erythema.  Eyes:     General: No scleral icterus.       Right eye: No discharge.        Left eye: No discharge.     Extraocular Movements: Extraocular movements intact.     Conjunctiva/sclera: Conjunctivae normal.     Pupils: Pupils are equal, round, and reactive to light.  Neck:     Vascular: No carotid bruit.  Cardiovascular:     Rate and Rhythm: Normal rate and regular rhythm.     Pulses: Normal pulses.     Heart sounds: Normal heart sounds. No murmur heard.    No friction rub. No gallop.  Pulmonary:     Effort: Pulmonary effort is normal. No respiratory distress.     Breath sounds: Normal breath sounds. No stridor. No wheezing, rhonchi or rales.  Chest:     Chest wall: No tenderness.      Comments: Right mastectomy is negative Left breast has no masses  Abdominal:     General: Bowel sounds are normal. There is no distension.     Palpations: Abdomen is soft. There is no hepatomegaly, splenomegaly or mass.     Tenderness: There is no abdominal tenderness. There is no right  CVA tenderness, left CVA tenderness, guarding or rebound.     Hernia: No hernia is present.     Comments: Liver feels normal  Musculoskeletal:        General: No swelling, deformity or signs of injury. Normal range of motion.     Cervical back: Normal range of motion and neck supple. No rigidity or tenderness.     Right lower leg: No edema.     Left lower leg: No edema.     Comments: Area of hard induration of the skin which is circumferential at the level of the knee  Lymphadenopathy:     Cervical: No cervical adenopathy.     Right cervical: No superficial, deep or posterior cervical adenopathy.    Left cervical: No superficial, deep or posterior cervical adenopathy.     Upper Body:     Right upper body: No supraclavicular, axillary or pectoral adenopathy.     Left upper body: No supraclavicular, axillary or pectoral adenopathy.  Skin:    General: Skin is warm and dry.     Coloration: Skin is not jaundiced or pale.     Findings: No bruising, erythema, lesion or rash.  Neurological:     General: No focal deficit present.     Mental Status: She is alert and oriented to person, place, and time. Mental status is at baseline.     Cranial Nerves: No cranial nerve deficit.     Sensory: No sensory deficit.     Motor: No weakness.     Coordination: Coordination normal.     Gait: Gait normal.     Deep Tendon Reflexes: Reflexes normal.  Psychiatric:        Mood and Affect: Mood normal.        Behavior: Behavior normal.        Thought Content: Thought content normal.        Judgment: Judgment normal.    LABS:      Latest Ref Rng & Units 12/08/2023    9:00 AM 12/01/2023    9:28 AM 11/03/2023    2:24 PM   CBC  WBC 4.0 - 10.5 K/uL 3.1  2.7  2.9   Hemoglobin 12.0 - 15.0 g/dL 88.3  88.8  88.6   Hematocrit 36.0 - 46.0 % 35.0  33.6  34.6   Platelets 150 - 400 K/uL 219  210  249       Latest Ref Rng & Units 12/08/2023    9:00 AM 12/01/2023    9:28 AM 11/03/2023    2:24 PM  CMP  Glucose 70 - 99 mg/dL 898  884  887   BUN 8 - 23 mg/dL 16  20  19    Creatinine 0.44 - 1.00 mg/dL 9.08  8.80  8.92   Sodium 135 - 145 mmol/L 134  134  135   Potassium 3.5 - 5.1 mmol/L 4.3  3.9  4.1   Chloride 98 - 111 mmol/L 99  99  98   CO2 22 - 32 mmol/L 21  22  23    Calcium  8.9 - 10.3 mg/dL 9.6  9.5  9.4   Total Protein 6.5 - 8.1 g/dL 6.7  6.7  6.9   Total Bilirubin 0.0 - 1.2 mg/dL 0.3  0.4  0.4   Alkaline Phos 38 - 126 U/L 70  60  77   AST 15 - 41 U/L 24  21  31    ALT 0 - 44 U/L 15  14  19  Component Ref Range & Units (hover) 1 d ago (12/01/23) 4 wk ago (11/03/23) 1 mo ago (10/06/23) 2 mo ago (09/08/23) 3 mo ago (08/11/23) 4 mo ago (07/14/23) 4 mo ago (07/06/23)  CA 27.29 87.2 High  69.9 High  CM 52.4 High  CM 47.4 High  CM 42.9 High  CM 35.3 CM 36.0 CM   No results found for: CEA1, CEA / No results found for: CEA1, CEA No results found for: PSA1 No results found for: CAN199 No results found for: CAN125  No results found for: TOTALPROTELP, ALBUMINELP, A1GS, A2GS, BETS, BETA2SER, GAMS, MSPIKE, SPEI No results found for: TIBC, FERRITIN, IRONPCTSAT Lab Results  Component Value Date   LDH 138 01/17/2011   LDH 155 12/28/2009   LDH 155 12/29/2008   STUDIES:  EXAM: 10/20/2023 NUCLEAR MEDICINE PET SKULL BASE TO THIGH IMPRESSION: 1. Status post right mastectomy. No findings for recurrent tumor. 2. Diffuse sclerotic osseous metastatic disease without focal hypermetabolism to suggest active disease. 3. No findings for visceral metastatic disease in the chest, abdomen or pelvis. 4. Aortic atherosclerosis.  Exam: 07/22/2023 MRI of the Lumbar Spine Impression: Numerous  osseous lesions throughout the lower spine, lumbar spine, and pelvis in keeping history of metastatic breast cancer. No pathologic fracture of the lumbar spine or scrum.  Multilevel degenerative disc and facet changes of the lumbar spine.  At L4-L5, moderate spinal canal stenosis and mild to moderate bilateral foraminal stenosis.  At L2-L3 and L3-L4, mild spinal canal and bilateral foraminal stenosis.  At L5-S1, mild bilateral foraminal stenosis.    MRI of the cervical spine  Impression: Anterior cervical fusion at C3-4 and C5-6. At C3-4 there is a severe left facet arthropathy and moderate bilateral foraminal stenosis and at C5-6 there is a moderate left foraminal stenosis.  C4-5 there has a mild bilateral foraminal stenosis and mild stenosis and C6-7 has a moderate bilateral foraminal stenosis and mild spinal stenosis.  EXAM: 05/26/2023 DIGITAL SCREENING UNILATERAL LEFT MAMMOGRAM WITH CAD AND TOMOSYNTHESIS IMPRESSION: No mammographic evidence of malignancy.     HISTORY:   Past Medical History:  Diagnosis Date   Anxiety    Cancer [facility])    Breast 1997 right tx with mastectomy and chemo, metastatic now   GERD (gastroesophageal reflux disease)    Hypercholesterolemia    Hypertension    Hypothyroidism    Osteoarthritis    oa   Personal history of chemotherapy    Personal history of radiation therapy    Secondary malignant neoplasm of bone and bone marrow (HCC)    TIA (transient ischemic attack) last 09/10/20   x 3 total, they seem to occur every 5 years    Past Surgical History:  Procedure Laterality Date   CATARACT EXTRACTION, BILATERAL     CERVICAL FUSION  2020   MASTECTOMY MODIFIED RADICAL Right 1997   porta cath insertion  1997   later removed   radiation tx  finished 02-25-16   x 20 tx   TONSILLECTOMY     TOTAL HIP ARTHROPLASTY Left 03/12/2016   Procedure: LEFT TOTAL HIP ARTHROPLASTY ANTERIOR APPROACH;  Surgeon: Dempsey Moan, MD;  Location: [facility];  Service:  Orthopedics;  Laterality: Left;   TOTAL HIP ARTHROPLASTY Right 04/13/2023   Procedure: TOTAL HIP ARTHROPLASTY ANTERIOR APPROACH;  Surgeon: Moan Dempsey, MD;  Location: [facility];  Service: Orthopedics;  Laterality: Right;   TOTAL KNEE ARTHROPLASTY Left    TOTAL KNEE ARTHROPLASTY Right 10/30/2021   Procedure: TOTAL KNEE ARTHROPLASTY;  Surgeon: Gerome Charleston,  MD;  Location: [facility];  Service: Orthopedics;  Laterality: Right;  adductor canal 120   TUBAL LIGATION      Family History  Problem Relation Age of Onset   Breast cancer Maternal Aunt     Social History:  reports that she has never smoked. She has never used smokeless tobacco. She reports current alcohol use of about 1.0 standard drink of alcohol per week. She reports that she does not use drugs.The patient is accompanied by her husband today.  Allergies:  Allergies  Allergen Reactions   Oxycodone Other (See Comments)    oxycodone  aspirin  / oxycodone   Oxycodone-Aspirin  Nausea And Vomiting, Nausea Only and Other (See Comments)    aspirin  / oxycodone   Other Nausea And Vomiting, Other (See Comments) and Rash   Oxycodone-Acetaminophen  Other (See Comments)   Erythromycin Nausea Only and Other (See Comments)   Oxycodone Hcl Nausea And Vomiting   Tramadol  Itching   Amoxicillin Hives and Dermatitis    Has patient had a PCN reaction causing immediate rash, facial/tongue/throat swelling, SOB or lightheadedness with hypotension:unsure  Has patient had a PCN reaction causing severe rash involving mucus membranes or skin necrosis:No  Has patient had a PCN reaction that required hospitalization:No  Has patient had a PCN reaction occurring within the last 10 years: Yes  If all of the above answers are NO, then may proceed with Cephalosporin use.  Has patient had a PCN reaction causing immediate rash, facial/tongue/throat swelling, SOB or lightheadedness with hypotension:unsure, Has patient had a PCN reaction causing severe rash  involving mucus membranes or skin necrosis:No, Has patient had a PCN reaction that required hospitalization:No, Has patient had a PCN reaction occurring within the last 10 years: Yes, If all of the above answers are NO, then may proceed with Cephalosporin use.    Current Medications: Current Outpatient Medications  Medication Sig Dispense Refill   rOPINIRole  (REQUIP ) 2 MG tablet Take 4 mg by mouth at bedtime. (Patient taking differently: Take 6 mg by mouth at bedtime.)     abemaciclib  (VERZENIO ) 100 MG tablet Take 1 tablet (100 mg total) by mouth 2 (two) times daily. 56 tablet 5   acetaminophen  (TYLENOL ) 500 MG tablet Take 1,000 mg by mouth every 6 (six) hours as needed for mild pain.     acidophilus (RISAQUAD) CAPS capsule Take 1 capsule by mouth daily.     ascorbic acid (VITAMIN C) 1000 MG tablet Take 1,000 mg by mouth daily.     aspirin  325 MG tablet Take 325 mg by mouth daily.     beta carotene 15 MG capsule Take 15 mg by mouth daily.     Biotin 5000 MCG TABS Take 5,000 mcg by mouth daily.     CALCIUM  CITRATE-VITAMIN D PO Take 1 tablet by mouth in the morning, at noon, in the evening, and at bedtime.     clopidogrel  (PLAVIX ) 75 MG tablet Take 1 tablet (75 mg total) by mouth daily.     cyanocobalamin  (,VITAMIN B-12,) 1000 MCG/ML injection Inject 1,000 mcg into the muscle every 30 (thirty) days.     Denosumab  (XGEVA  [place]) Inject 1 Dose into the skin every 30 (thirty) days.     famotidine  (PEPCID ) 40 MG tablet Take 40 mg by mouth daily.     glucosamine-chondroitin 500-400 MG tablet Take 1 tablet by mouth daily.     ketotifen (ZADITOR) 0.025 % ophthalmic solution Place 1 drop into both eyes daily as needed (allergies).     levothyroxine  (  SYNTHROID , LEVOTHROID) 88 MCG tablet Take 88 mcg by mouth daily before breakfast.     LORazepam  (ATIVAN ) 1 MG tablet Take 1 tablet (1 mg total) by mouth every 8 (eight) hours as needed for anxiety. 30 tablet 0   losartan  (COZAAR ) 25 MG tablet Take 25 mg by  mouth daily.     Lutein 20 MG CAPS Take 20 mg by mouth daily.     meclizine  (ANTIVERT ) 25 MG tablet Take 25 mg by mouth 3 (three) times daily as needed for dizziness.     melatonin 5 MG TABS Take 5 mg by mouth at bedtime as needed (sleep).     metroNIDAZOLE  (METROCREAM ) 0.75 % cream Apply 1 Application topically daily.     Multiple Vitamins-Minerals (MULTIVITAMIN WITH MINERALS) tablet Take 1 tablet by mouth daily.     Omega-3 1000 MG CAPS Take 1,000 mg by mouth in the morning and at bedtime.     pantoprazole  (PROTONIX ) 40 MG tablet Take 40 mg by mouth daily.     Polyethyl Glycol-Propyl Glycol 0.4-0.3 % SOLN Place 1-2 drops into both eyes 2 (two) times daily.     rosuvastatin  (CRESTOR ) 20 MG tablet Take 1 tablet (20 mg total) by mouth daily.     triamcinolone  cream (KENALOG ) 0.1 % Apply topically 2 (two) times daily as needed.     vitamin E 180 MG (400 UNITS) capsule Take 400 Units by mouth daily.     zinc gluconate 50 MG tablet Take 50 mg by mouth daily.     No current facility-administered medications for this visit.    I,Jasmine M Lassiter,acting as a scribe for [name], MD.,have documented all relevant documentation on the behalf of [name], MD,as directed by  [name], MD while in the presence of [name], MD.

## 2023-12-02 NOTE — Telephone Encounter (Addendum)
 Addendum: Prescription dose and frequency assessed for appropriateness.   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  Labs from 12/01/2023 (CBC and CMP) assessed, no interventions needed. Prescription has not been entered yet by MD. Plan is for 100mg  BID dosing.   Current medication list in Epic reviewed, no significant/ relevant DDIs with Verzenio  identified.   Evaluated chart and no patient barriers to medication adherence noted.   Patient agreement for treatment documented in MD note on 12/02/2023.  Prescription has been e-scribed to the [facility] for benefits analysis and approval.  Oral Oncology Clinic will continue to follow for insurance authorization, copayment issues, initial counseling and start date.  Lorella Gomez, PharmD Hematology/Oncology Clinical Pharmacist [facility] Oral Chemotherapy Navigation Clinic [phone] 12/02/2023 1:18 PM

## 2023-12-04 ENCOUNTER — Other Ambulatory Visit: Payer: Self-pay

## 2023-12-04 NOTE — Progress Notes (Signed)
 Specialty Pharmacy Initial Fill Coordination Note  Jade Mooney is a 78 y.o. female contacted today regarding refills of specialty medication(s) Abemaciclib  (VERZENIO ) .  Patient requested Delivery  on 12/08/23  to verified address [address]   [place] [address]   Medication will be filled on 12/07/23.   Patient is aware of 0.00 copayment.    Jade Mooney, CPhT [place]  [facility]ty Pharm[facility] Services Pharmacy Technician Patient Advocate Specialist II Jade Mooney Jade Mooney Phone: [phone]  Fax: [phone] [email or web address]

## 2023-12-04 NOTE — Telephone Encounter (Signed)
 Oral Chemotherapy Pharmacist Encounter  I spoke with patient for overview of: Verzenio  for the treatment of metastatic, hormone-receptor positive breast cancer, in combination with fulvestrant , planned duration until disease progression or unacceptable toxicity.   Treatment goal: Palliative  Counseled patient on administration, dosing, side effects, monitoring, drug-food interactions, safe handling, storage, and disposal.  Patient will take Verzenio  100mg  tablets, 1 tablet by mouth twice daily without regard to food. Patient knows to avoid grapefruit and grapefruit juice.  Verzenio  start date: 12/08/2023  Adverse effects include but are not limited to: diarrhea, fatigue, nausea, abdominal pain, decreased blood counts, and increased liver function tests, and joint pains. Severe, life-threatening, and/or fatal interstitial lung disease (ILD) and/or pneumonitis may occur with CDK 4/6 inhibitors.  Patient does not have any anti-emetic on hand and knows to call if nausea develops. Patient will obtain anti diarrheal and alert the office of 4 or more loose stools above baseline.  Reviewed with patient importance of keeping a medication schedule and plan for any missed doses. No barriers to medication adherence identified. Medication reconciliation performed and medication/allergy list updated.  Distress thermometer not completed during telephone call as patient has been on previous lines of therapy.   Communication and Learning Assessment Primary learner: patient Barriers to learning: No barriers Preferred language: English Learning preferences: Listening Reading  All questions answered. Patient voiced understanding and appreciation. Medication education handout placed in mail for patient. Patient knows to call the office with questions or concerns. Oral Chemotherapy Clinic phone number provided to patient.   Morell Mears, PharmD Hematology/Oncology Clinical Pharmacist [facility] Oral  Chemotherapy Navigation Clinic [phone] 12/04/2023   10:59 AM

## 2023-12-04 NOTE — Progress Notes (Signed)
 Patient counseled on Ibrance  in telephone encounter opened on 12/02/2023.  Yashica Sterbenz, PharmD Hematology/Oncology Clinical Pharmacist [facility] [phone]

## 2023-12-07 ENCOUNTER — Other Ambulatory Visit: Payer: Self-pay

## 2023-12-08 ENCOUNTER — Inpatient Hospital Stay (HOSPITAL_BASED_OUTPATIENT_CLINIC_OR_DEPARTMENT_OTHER): Admitting: Oncology

## 2023-12-08 ENCOUNTER — Telehealth: Payer: Self-pay | Admitting: Oncology

## 2023-12-08 ENCOUNTER — Inpatient Hospital Stay

## 2023-12-08 ENCOUNTER — Encounter: Payer: Self-pay | Admitting: Oncology

## 2023-12-08 VITALS — BP 155/74 | HR 62 | Temp 97.6°F | Resp 18 | Ht 62.0 in | Wt 183.5 lb

## 2023-12-08 DIAGNOSIS — C7951 Secondary malignant neoplasm of bone: Secondary | ICD-10-CM | POA: Diagnosis not present

## 2023-12-08 DIAGNOSIS — Z5111 Encounter for antineoplastic chemotherapy: Secondary | ICD-10-CM | POA: Diagnosis not present

## 2023-12-08 DIAGNOSIS — C50811 Malignant neoplasm of overlapping sites of right female breast: Secondary | ICD-10-CM | POA: Diagnosis not present

## 2023-12-08 DIAGNOSIS — C50919 Malignant neoplasm of unspecified site of unspecified female breast: Secondary | ICD-10-CM

## 2023-12-08 DIAGNOSIS — Z170 Estrogen receptor positive status [ER+]: Secondary | ICD-10-CM

## 2023-12-08 DIAGNOSIS — Z1721 Progesterone receptor positive status: Secondary | ICD-10-CM | POA: Diagnosis not present

## 2023-12-08 LAB — CMP (CANCER CENTER ONLY)
ALT: 15 U/L (ref 0–44)
AST: 24 U/L (ref 15–41)
Albumin: 4.3 g/dL (ref 3.5–5.0)
Alkaline Phosphatase: 70 U/L (ref 38–126)
Anion gap: 14 (ref 5–15)
BUN: 16 mg/dL (ref 8–23)
CO2: 21 mmol/L — ABNORMAL LOW (ref 22–32)
Calcium: 9.6 mg/dL (ref 8.9–10.3)
Chloride: 99 mmol/L (ref 98–111)
Creatinine: 0.91 mg/dL (ref 0.44–1.00)
GFR, Estimated: >60 mL/min (ref >60)
Glucose, Bld: 101 mg/dL — ABNORMAL HIGH (ref 70–99)
Potassium: 4.3 mmol/L (ref 3.5–5.1)
Sodium: 134 mmol/L — ABNORMAL LOW (ref 135–145)
Total Bilirubin: 0.3 mg/dL (ref 0.0–1.2)
Total Protein: 6.7 g/dL (ref 6.5–8.1)

## 2023-12-08 LAB — CBC WITH DIFFERENTIAL (CANCER CENTER ONLY)
Abs Immature Granulocytes: 0.02 K/uL (ref 0.00–0.07)
Basophils Absolute: 0.1 K/uL (ref 0.0–0.1)
Basophils Relative: 2 %
Eosinophils Absolute: 0.1 K/uL (ref 0.0–0.5)
Eosinophils Relative: 4 %
HCT: 35 % — ABNORMAL LOW (ref 36.0–46.0)
Hemoglobin: 11.6 g/dL — ABNORMAL LOW (ref 12.0–15.0)
Immature Granulocytes: 1 %
Lymphocytes Relative: 31 %
Lymphs Abs: 1 K/uL (ref 0.7–4.0)
MCH: 29.6 pg (ref 26.0–34.0)
MCHC: 33.1 g/dL (ref 30.0–36.0)
MCV: 89.3 fL (ref 80.0–100.0)
Monocytes Absolute: 0.4 K/uL (ref 0.1–1.0)
Monocytes Relative: 14 %
Neutro Abs: 1.5 K/uL — ABNORMAL LOW (ref 1.7–7.7)
Neutrophils Relative %: 48 %
Platelet Count: 219 K/uL (ref 150–400)
RBC: 3.92 MIL/uL (ref 3.87–5.11)
RDW: 15.4 % (ref 11.5–15.5)
WBC Count: 3.1 K/uL — ABNORMAL LOW (ref 4.0–10.5)
nRBC: 0 % (ref 0.0–0.2)

## 2023-12-08 NOTE — Telephone Encounter (Signed)
 Patient has been scheduled for follow-up visit per 12/08/23 LOS.  Pt given an appt calendar with date and time.

## 2023-12-09 ENCOUNTER — Inpatient Hospital Stay

## 2023-12-09 LAB — CANCER ANTIGEN 27.29: CA 27.29: 96.3 U/mL — ABNORMAL HIGH (ref 0.0–38.6)

## 2023-12-14 DIAGNOSIS — M25511 Pain in right shoulder: Secondary | ICD-10-CM | POA: Diagnosis not present

## 2023-12-14 DIAGNOSIS — E538 Deficiency of other specified B group vitamins: Secondary | ICD-10-CM | POA: Diagnosis not present

## 2023-12-15 ENCOUNTER — Telehealth: Payer: Self-pay

## 2023-12-16 ENCOUNTER — Encounter: Payer: Self-pay | Admitting: Oncology

## 2023-12-16 DIAGNOSIS — L820 Inflamed seborrheic keratosis: Secondary | ICD-10-CM | POA: Diagnosis not present

## 2023-12-16 NOTE — Telephone Encounter (Signed)
 Pt taking Verzenio  100mg  po BID(730a-8a, and again between 730p-8p). No missed doses. Pt mentions she & her spouse are going on a bus trip to [place], for 3 days 12/22/2023. Dr Cornelius told her she could hold the Verzenio  those days to avoid diarrhea.

## 2023-12-17 ENCOUNTER — Encounter: Payer: Self-pay | Admitting: Oncology

## 2023-12-18 ENCOUNTER — Inpatient Hospital Stay

## 2023-12-18 DIAGNOSIS — C50919 Malignant neoplasm of unspecified site of unspecified female breast: Secondary | ICD-10-CM

## 2023-12-18 DIAGNOSIS — Z5111 Encounter for antineoplastic chemotherapy: Secondary | ICD-10-CM | POA: Diagnosis not present

## 2023-12-18 DIAGNOSIS — Z1721 Progesterone receptor positive status: Secondary | ICD-10-CM | POA: Diagnosis not present

## 2023-12-18 DIAGNOSIS — Z170 Estrogen receptor positive status [ER+]: Secondary | ICD-10-CM | POA: Diagnosis not present

## 2023-12-18 DIAGNOSIS — C7951 Secondary malignant neoplasm of bone: Secondary | ICD-10-CM | POA: Diagnosis not present

## 2023-12-18 LAB — CBC WITH DIFFERENTIAL (CANCER CENTER ONLY)
Abs Immature Granulocytes: 0.02 K/uL (ref 0.00–0.07)
Basophils Absolute: 0.1 K/uL (ref 0.0–0.1)
Basophils Relative: 2 %
Eosinophils Absolute: 0.2 K/uL (ref 0.0–0.5)
Eosinophils Relative: 5 %
HCT: 34.3 % — ABNORMAL LOW (ref 36.0–46.0)
Hemoglobin: 11.8 g/dL — ABNORMAL LOW (ref 12.0–15.0)
Immature Granulocytes: 1 %
Lymphocytes Relative: 23 %
Lymphs Abs: 0.8 K/uL (ref 0.7–4.0)
MCH: 30.4 pg (ref 26.0–34.0)
MCHC: 34.4 g/dL (ref 30.0–36.0)
MCV: 88.4 fL (ref 80.0–100.0)
Monocytes Absolute: 0.3 K/uL (ref 0.1–1.0)
Monocytes Relative: 8 %
Neutro Abs: 2 K/uL (ref 1.7–7.7)
Neutrophils Relative %: 61 %
Platelet Count: 220 K/uL (ref 150–400)
RBC: 3.88 MIL/uL (ref 3.87–5.11)
RDW: 14.9 % (ref 11.5–15.5)
WBC Count: 3.3 K/uL — ABNORMAL LOW (ref 4.0–10.5)
nRBC: 0 % (ref 0.0–0.2)

## 2023-12-18 LAB — CMP (CANCER CENTER ONLY)
ALT: 18 U/L (ref 0–44)
AST: 26 U/L (ref 15–41)
Albumin: 4.2 g/dL (ref 3.5–5.0)
Alkaline Phosphatase: 56 U/L (ref 38–126)
Anion gap: 14 (ref 5–15)
BUN: 17 mg/dL (ref 8–23)
CO2: 21 mmol/L — ABNORMAL LOW (ref 22–32)
Calcium: 9.4 mg/dL (ref 8.9–10.3)
Chloride: 99 mmol/L (ref 98–111)
Creatinine: 1.14 mg/dL — ABNORMAL HIGH (ref 0.44–1.00)
GFR, Estimated: 49 mL/min — ABNORMAL LOW (ref >60)
Glucose, Bld: 182 mg/dL — ABNORMAL HIGH (ref 70–99)
Potassium: 4.1 mmol/L (ref 3.5–5.1)
Sodium: 134 mmol/L — ABNORMAL LOW (ref 135–145)
Total Bilirubin: 0.4 mg/dL (ref 0.0–1.2)
Total Protein: 6.6 g/dL (ref 6.5–8.1)

## 2023-12-19 LAB — CANCER ANTIGEN 27.29: CA 27.29: 101.8 U/mL — ABNORMAL HIGH (ref 0.0–38.6)

## 2023-12-24 ENCOUNTER — Other Ambulatory Visit: Payer: Self-pay

## 2023-12-27 DIAGNOSIS — Z23 Encounter for immunization: Secondary | ICD-10-CM | POA: Diagnosis not present

## 2023-12-28 ENCOUNTER — Other Ambulatory Visit (HOSPITAL_COMMUNITY): Payer: Self-pay

## 2023-12-28 ENCOUNTER — Encounter (INDEPENDENT_AMBULATORY_CARE_PROVIDER_SITE_OTHER): Payer: Self-pay

## 2023-12-28 ENCOUNTER — Other Ambulatory Visit: Payer: Self-pay

## 2023-12-28 NOTE — Progress Notes (Signed)
 Specialty Pharmacy Refill Coordination Note  MyChart Questionnaire Submission  Jade Mooney is a 78 y.o. female contacted today regarding refills of specialty medication(s) Verzenio .  Doses on hand: (Patient-Rptd) 14   Patient requested: (Patient-Rptd) Delivery   Delivery date: 01/04/24  Verified address: [address] [place]  [address]  Medication will be filled on 01/01/24.

## 2023-12-29 ENCOUNTER — Inpatient Hospital Stay

## 2023-12-29 ENCOUNTER — Other Ambulatory Visit

## 2023-12-29 ENCOUNTER — Other Ambulatory Visit: Payer: Self-pay

## 2023-12-29 ENCOUNTER — Inpatient Hospital Stay: Attending: Oncology | Admitting: Oncology

## 2023-12-29 ENCOUNTER — Encounter: Payer: Self-pay | Admitting: Oncology

## 2023-12-29 ENCOUNTER — Ambulatory Visit

## 2023-12-29 ENCOUNTER — Telehealth: Payer: Self-pay

## 2023-12-29 ENCOUNTER — Other Ambulatory Visit: Payer: Self-pay | Admitting: Oncology

## 2023-12-29 VITALS — BP 158/80 | HR 64 | Temp 97.8°F | Resp 18 | Ht 62.0 in | Wt 181.4 lb

## 2023-12-29 DIAGNOSIS — Z1721 Progesterone receptor positive status: Secondary | ICD-10-CM | POA: Insufficient documentation

## 2023-12-29 DIAGNOSIS — C787 Secondary malignant neoplasm of liver and intrahepatic bile duct: Secondary | ICD-10-CM | POA: Insufficient documentation

## 2023-12-29 DIAGNOSIS — C50912 Malignant neoplasm of unspecified site of left female breast: Secondary | ICD-10-CM

## 2023-12-29 DIAGNOSIS — C50919 Malignant neoplasm of unspecified site of unspecified female breast: Secondary | ICD-10-CM | POA: Diagnosis present

## 2023-12-29 DIAGNOSIS — Z170 Estrogen receptor positive status [ER+]: Secondary | ICD-10-CM | POA: Insufficient documentation

## 2023-12-29 DIAGNOSIS — C7951 Secondary malignant neoplasm of bone: Secondary | ICD-10-CM | POA: Diagnosis present

## 2023-12-29 DIAGNOSIS — C792 Secondary malignant neoplasm of skin: Secondary | ICD-10-CM | POA: Diagnosis not present

## 2023-12-29 DIAGNOSIS — C50811 Malignant neoplasm of overlapping sites of right female breast: Secondary | ICD-10-CM

## 2023-12-29 DIAGNOSIS — G893 Neoplasm related pain (acute) (chronic): Secondary | ICD-10-CM

## 2023-12-29 DIAGNOSIS — R110 Nausea: Secondary | ICD-10-CM | POA: Diagnosis not present

## 2023-12-29 DIAGNOSIS — R197 Diarrhea, unspecified: Secondary | ICD-10-CM | POA: Diagnosis not present

## 2023-12-29 DIAGNOSIS — Z1732 Human epidermal growth factor receptor 2 negative status: Secondary | ICD-10-CM | POA: Diagnosis not present

## 2023-12-29 LAB — CBC WITH DIFFERENTIAL (CANCER CENTER ONLY)
Abs Immature Granulocytes: 0.01 K/uL (ref 0.00–0.07)
Basophils Absolute: 0.1 K/uL (ref 0.0–0.1)
Basophils Relative: 2 %
Eosinophils Absolute: 0.1 K/uL (ref 0.0–0.5)
Eosinophils Relative: 4 %
HCT: 33 % — ABNORMAL LOW (ref 36.0–46.0)
Hemoglobin: 11.2 g/dL — ABNORMAL LOW (ref 12.0–15.0)
Immature Granulocytes: 0 %
Lymphocytes Relative: 33 %
Lymphs Abs: 1 K/uL (ref 0.7–4.0)
MCH: 29.8 pg (ref 26.0–34.0)
MCHC: 33.9 g/dL (ref 30.0–36.0)
MCV: 87.8 fL (ref 80.0–100.0)
Monocytes Absolute: 0.5 K/uL (ref 0.1–1.0)
Monocytes Relative: 16 %
Neutro Abs: 1.4 K/uL — ABNORMAL LOW (ref 1.7–7.7)
Neutrophils Relative %: 45 %
Platelet Count: 167 K/uL (ref 150–400)
RBC: 3.76 MIL/uL — ABNORMAL LOW (ref 3.87–5.11)
RDW: 15 % (ref 11.5–15.5)
WBC Count: 3.2 K/uL — ABNORMAL LOW (ref 4.0–10.5)
nRBC: 0 % (ref 0.0–0.2)

## 2023-12-29 LAB — CMP (CANCER CENTER ONLY)
ALT: 14 U/L (ref 0–44)
AST: 24 U/L (ref 15–41)
Albumin: 4.2 g/dL (ref 3.5–5.0)
Alkaline Phosphatase: 59 U/L (ref 38–126)
Anion gap: 12 (ref 5–15)
BUN: 16 mg/dL (ref 8–23)
CO2: 22 mmol/L (ref 22–32)
Calcium: 9 mg/dL (ref 8.9–10.3)
Chloride: 99 mmol/L (ref 98–111)
Creatinine: 1.12 mg/dL — ABNORMAL HIGH (ref 0.44–1.00)
GFR, Estimated: 50 mL/min — ABNORMAL LOW (ref >60)
Glucose, Bld: 105 mg/dL — ABNORMAL HIGH (ref 70–99)
Potassium: 4.1 mmol/L (ref 3.5–5.1)
Sodium: 133 mmol/L — ABNORMAL LOW (ref 135–145)
Total Bilirubin: 0.3 mg/dL (ref 0.0–1.2)
Total Protein: 6.5 g/dL (ref 6.5–8.1)

## 2023-12-29 MED ORDER — FULVESTRANT 250 MG/5ML IM SOSY
500.0000 mg | PREFILLED_SYRINGE | Freq: Once | INTRAMUSCULAR | Status: AC
  Administered 2023-12-29: 500 mg via INTRAMUSCULAR
  Filled 2023-12-29: qty 10

## 2023-12-29 MED ORDER — DENOSUMAB 120 MG/1.7ML ~~LOC~~ SOLN
120.0000 mg | Freq: Once | SUBCUTANEOUS | Status: AC
  Administered 2023-12-29: 120 mg via SUBCUTANEOUS
  Filled 2023-12-29: qty 1.7

## 2023-12-29 MED ORDER — ONDANSETRON HCL 4 MG PO TABS: 4.0000 mg | ORAL_TABLET | ORAL | 5 refills | Status: AC | PRN

## 2023-12-29 NOTE — Progress Notes (Signed)
 Patient called back and requested we change her delivery date 01/12/24 due to a trip she will be on.  She will be taking a break from therapy during the trip that was approved by her provider.  Now shipping 01/11/24.

## 2023-12-29 NOTE — Telephone Encounter (Signed)
 Pt continues to take the Verzenio  100mg  po BID. She has taken at total of 16 days of Verzenio  thus far. Days missed were total of 6, due to short trip, traveling on bus. Dr Cornelius was aware of this and agreed so pt wouldn't have issues with diarrhea. Pt is getting ready to take a cruise to the [place] soon as well. She will miss a few days doses again. Her WBC was a tad lower even with missed doses, so Dr Cornelius is bringing her back in 2 weeks for labs, then she will see in 4 weeks. Pt reminded to call us  if she were to develop temp of 100.4 or higher, day or night. She verbalized understanding.

## 2023-12-29 NOTE — Progress Notes (Signed)
 [facility]  [address] [place],  [place]  [address] [phone]  Clinic Day: 12/29/23  Referring physician: Rusty Lonni HERO, *  ASSESSMENT & PLAN:  Assessment: Primary malignant neoplasm of breast with metastasis [facility]) Patient has a complicated oncology history history of invasive ductal carcinoma of the right breast diagnosed in 1997.  She was treated with mastectomy and adjuvant chemotherapy.  She also participated in clinical trial using adjuvant high-dose chemotherapy followed by stem cell transplant.  She was treated with adjuvant tamoxifen for 5 years.  She developed biopsy-proven liver metastasis, estrogen and HER2 receptor positive, in November 2007.  She received trastuzumab for an unknown period of time.  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  She was started on zoledronic  acid in July and anastrozole  in August, 2015.    She developed a metastatic lesion of the right anterior chest in August, 2017 confirmed to be adenocarcinoma and treated with excision and radiation.  Anastrozole  was discontinued and she was switched to letrozole .  Palbociclib  was added in January, 2018 after PET imaging revealed 2 new foci at T8 and the manubrium.  The dose of palbociclib  had to be decreased over time.  Zoledronic  acid was discontinued in January, 2019.  Letrozole  was discontinued due to progression and was started on fulvestrant  in February 2019 due to progression. She received palliative radiation to the cervical spine in February, 2019. Palbociclib  was held during radiation and resumed at 75 mg daily once completed. Denosumab  was started in March, 2019.  She has remained on this regimen without evidence of progression.  A PET in January, 2025 was stable, with evidence of treated bone metastases. She had significant neutropenia with the Ibrance  even taking it every other day and so we cannot increase the dose any  further. Now with her rising CA 27.29, we suspected her cancer may be becoming resistant and so a PET scan was done on 10/20/2023, which revealed diffuse sclerotic osseous metastatic disease without focal hypermetabolism and no findings for visceral metastatic disease in the chest, abdomen or pelvis. When the CA 27.29 increased from 69.9 to 87.2, we stopped the Ibrance  and changed to Verzenio  100 mg BID along with the fluvestrant injections. The tumor marker continues to increase but she has not been consistently on the Verzenio  for very long.   Severe Osteoarthritis/Degenerative Disc Disease  She has already had knee replacement and right hip replacement. 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 She is now undergoing physical therapy with some improvement of her back pain. She is now also having right shoulder pain and PT is ordered.   Plan:  Patient had X-rays done at [facility] on 12/14/2023 for evaluation of her right shoulder pain which were negative. She and her husband have just returned from vacation and she stopped her Verzenio  2 days before leaving and experienced no diarrhea during that time. She is now on day 16 of Verzenio  100 mg BID and has only experienced some mild tolerable nausea. I will prescribe Zofran  4 mg Q4hrs prn for nausea control. They will be going back on vacation this Friday and she will return in 7 days but intends to hold her oral chemotherapy during that time. She has a low WBC of 3.2 with an ANC of 1400, low hemoglobin of 11.2 down from 11.8, and platelet count of 167,000. Her CMP is normal other than  an chronic low sodium of 133 and elevated creatinine of 1.12 improved from 1.14. Her calcium  is low normal at 9.0 and she informed me that she has been inconsistent with her calcium  supplement. I encouraged her to take this regularly. Her CA 27.29 on 12/18/2023 was up to 101.8 up from 96.3. Patient will receive her  Faslodex  and Xgeva  injection today. She will return in 2 weeks with CBC and CMP. I will then see her in 4 weeks with CBC, CMP, CA 27.29, Xgeva , and Faslodex  injection. We will plan to do a PET scan by the end of the year if not sooner. The patient understands the plans discussed today and is in agreement with them.  She knows to contact our office if she develops concerns prior to her next appointment.  I provided 30 minutes of face-to-face time during this encounter and > 50% was spent counseling as documented under my assessment and plan.   [name], MD [facility] - A DEPT OF [facility] [address] [place] [address] Dept: [phone] Dept Fax: [phone]   No orders of the defined types were placed in this encounter.   CHIEF COMPLAINT:  CC: Recurrent hormone receptor positive breast cancer   Current Treatment:  Palbociclib /fulvestrant /denosumab    HISTORY OF PRESENT ILLNESS:  Jade Mooney is a 78 y.o. female who we began seeing in January 2023 for transfer of care from Dr. Sandria Gell for the continued treatment and management of metastatic breast cancer. She was originally diagnosed with stage III hormone receptor positive right breast cancer in 1997.  She was treated with right mastectomy and adjuvant chemotherapy with Taxotere for 4 cycles.  She also participated in a [facility] protocol with high-dose chemotherapy, cyclophosphamide/carboplatin/BCNU, followed by stem cell rescue in September 1997. She completed 5 years of tamoxifen in November 2002.  Unfortunately, she developed metastatic liver lesions, biopsy proven metastatic breast cancer, estrogen receptor positive and HER2 receptor positive, in November 2007.  She was treated with trastuzumab for an unknown period of time.    In July 2015, she was found to have incidental findings of mixed lytic and sclerotic bony lesions on CT abdomen and pelvis.  She underwent biopsy of the right iliac bone and surgical pathology confirmed invasive ductal carcinoma, grade 2, estrogen and progesterone receptor positive. Bone density from May 2015 was normal. She was started on zolendronic acid every 12 weeks in July 2015. She was also found to have three scalp lesions, biopsied in July 2015 confirming metastatic breast cancer. HER2 negative. She was started on anastrozole  in August 2015.   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     Anastrozole  was discontinued and she was switched to letrozole  in April 2018.  Palbociclib  was added in January 2018 after PET imaging revealed 2 new foci at T8 and the manubrium.   Zoledronic  acid was discontinued in January 2019.  Letrozole  was discontinued in February 2019 due to progression. She was started on fulvestrant  in February 2019. She received palliative radiation to the cervical spine in February 2019. Palbociclib  was held during that time and resumed at 75 mg daily once completed. Denosumab  was started in March 2019.    She has continued on palbociclib  75 mg daily for 3 weeks on and 1 week off, along with fulvestrant  and denosumab  every 4 weeks.  Most recent imaging  was a PET scan in December 2023 revealed significant interval change in the diffuse osteoblastic metastatic lesions in the axial and proximal appendicular skeleton without abnormal FDG avidity compatible with chronic treated disease. No evidence of hypermetabolic local recurrent breast cancer or viable distant metastatic disease.    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 Patient states that she feels well but complains of dry cough with occasional yellow phlegm, chronic right shoulder pain, and sacroiliac joint pain. She had X-rays done at [facility] on 12/14/2023 for evaluation of her right shoulder pain, which were negative. She and her husband have just returned from vacation and she stopped her Verzenio  2 days before leaving and experienced no diarrhea during that time. She is now on day 16 of Verzenio  100 mg BID and has only experienced some mild tolerable nausea. I will prescribe Zofran  4 mg Q4hrs prn for nausea control. They will be going back on vacation this Friday and she will return in 7 days but intends to hold her oral chemotherapy during that time. She has a low WBC of 3.2 with an ANC of 1400, low hemoglobin of 11.2 down from 11.8, and platelet count of 167,000. Her CMP is normal other than an chronic low sodium of 133 and elevated creatinine of 1.12 improved from 1.14. Her calcium  is low normal at 9.0 and she informed me that she has been inconsistent with her calcium  supplement. I encouraged her to take this regularly. Her CA 27.29 on 12/18/2023 was at 101.8 up from 96.3. Patient will receive her Faslodex  and Xgeva  injection today. She will return in 2 weeks with CBC and CMP. I will then see her in 4 weeks with CBC, CMP, CA 27.29, Xgeva , and Faslodex  injection. We will plan to do a PET scan by the end of the year if not sooner.   She denies fever, chills, night sweats, or other signs of infection. She denies cardiorespiratory and gastrointestinal issues. Her appetite is good and Her weight has decreased 2 pounds over last 3 weeks.This patient is accompanied  in the office by her husband.  REVIEW OF SYSTEMS:  Review of Systems  Constitutional: Negative.  Negative for appetite change, chills, diaphoresis, fatigue, fever and unexpected weight change.  HENT:  Negative.  Negative for hearing loss, lump/mass, mouth sores, nosebleeds, sore throat, tinnitus, trouble swallowing and voice change.   Eyes: Negative.  Negative for eye problems and icterus.  Respiratory:  Positive for cough (dry cough with occasional yellow phlegm). Negative for chest tightness, hemoptysis, shortness of breath and wheezing.   Cardiovascular:  Negative for chest pain, leg swelling and palpitations.  Gastrointestinal:  Positive for nausea (mild). Negative for abdominal distention, abdominal pain, blood in stool, constipation, diarrhea, rectal pain and vomiting.  Endocrine: Negative.   Genitourinary: Negative.  Negative for bladder incontinence, difficulty urinating, dyspareunia, dysuria, frequency, hematuria, menstrual problem, nocturia, pelvic pain, vaginal bleeding and vaginal  discharge.   Musculoskeletal:  Positive for arthralgias, back pain (3/10) and myalgias. Negative for flank pain, gait problem, neck pain and neck stiffness.       Chronic occasional right shoulder pain Sacroiliac pain  Skin: Negative.  Negative for itching, rash and wound.  Neurological:  Negative for dizziness, extremity weakness, gait problem, headaches, light-headedness, numbness, seizures and speech difficulty.  Hematological: Negative.  Negative for adenopathy. Does not bruise/bleed easily.  Psychiatric/Behavioral: Negative.  Negative for confusion, decreased concentration, depression, sleep disturbance and suicidal ideas. The patient is not nervous/anxious.     VITALS:  Blood pressure (!) 158/80, pulse 64, temperature 97.8 F (36.6 C), temperature source Oral, resp. rate 18, height 5' 2 (1.575 m), weight 181 lb 6.4 oz (82.3 kg), SpO2 97%.  Wt Readings from Last 3 Encounters:  12/29/23 181 lb 6.4  oz (82.3 kg)  12/08/23 183 lb 8 oz (83.2 kg)  12/01/23 182 lb 4 oz (82.7 kg)    Body mass index is 33.18 kg/m.  Performance status (ECOG): 1 - Symptomatic but completely ambulatory  PHYSICAL EXAM:  Physical Exam Vitals and nursing note reviewed. Exam conducted with a chaperone present.  Constitutional:      General: She is not in acute distress.    Appearance: Normal appearance. She is normal weight. She is not ill-appearing, toxic-appearing or diaphoretic.  HENT:     Head: Normocephalic and atraumatic.     Right Ear: Tympanic membrane, ear canal and external ear normal. There is no impacted cerumen.     Left Ear: Tympanic membrane, ear canal and external ear normal. There is no impacted cerumen.     Nose: Nose normal. No congestion or rhinorrhea.     Mouth/Throat:     Mouth: Mucous membranes are moist.     Pharynx: Oropharynx is clear. No oropharyngeal exudate or posterior oropharyngeal erythema.  Eyes:     General: No scleral icterus.       Right eye: No discharge.        Left eye: No discharge.     Extraocular Movements: Extraocular movements intact.     Conjunctiva/sclera: Conjunctivae normal.     Pupils: Pupils are equal, round, and reactive to light.  Neck:     Vascular: No carotid bruit.  Cardiovascular:     Rate and Rhythm: Normal rate and regular rhythm.     Pulses: Normal pulses.     Heart sounds: Normal heart sounds. No murmur heard.    No friction rub. No gallop.  Pulmonary:     Effort: Pulmonary effort is normal. No respiratory distress.     Breath sounds: Normal breath sounds. No stridor. No wheezing, rhonchi or rales.  Chest:     Chest wall: No tenderness.     Comments: Right mastectomy is negative Left breast is without masses Well healed scar in the upper inner quadrant of the left breast with a tiny nodule just medial to it (stable finding) Abdominal:     General: Bowel sounds are normal. There is no distension.     Palpations: Abdomen is soft. There  is no hepatomegaly, splenomegaly or mass.     Tenderness: There is no abdominal tenderness. There is no right CVA tenderness, left CVA tenderness, guarding or rebound.     Hernia: No hernia is present.     Comments: Liver feels normal  Musculoskeletal:        General: No swelling, deformity or signs of injury. Normal range of motion.  Cervical back: Normal range of motion and neck supple. No rigidity or tenderness.     Right lower leg: No edema.     Left lower leg: No edema.     Comments: Area of hard induration of the skin which is circumferential at the level of the knee  Lymphadenopathy:     Cervical: No cervical adenopathy.     Right cervical: No superficial, deep or posterior cervical adenopathy.    Left cervical: No superficial, deep or posterior cervical adenopathy.     Upper Body:     Right upper body: No supraclavicular, axillary or pectoral adenopathy.     Left upper body: No supraclavicular, axillary or pectoral adenopathy.  Skin:    General: Skin is warm and dry.     Coloration: Skin is not jaundiced or pale.     Findings: No bruising, erythema, lesion or rash.  Neurological:     General: No focal deficit present.     Mental Status: She is alert and oriented to person, place, and time. Mental status is at baseline.     Cranial Nerves: No cranial nerve deficit.     Sensory: No sensory deficit.     Motor: No weakness.     Coordination: Coordination normal.     Gait: Gait normal.     Deep Tendon Reflexes: Reflexes normal.  Psychiatric:        Mood and Affect: Mood normal.        Behavior: Behavior normal.        Thought Content: Thought content normal.        Judgment: Judgment normal.    LABS:      Latest Ref Rng & Units 12/29/2023    8:31 AM 12/18/2023    9:03 AM 12/08/2023    9:00 AM  CBC  WBC 4.0 - 10.5 K/uL 3.2  3.3  3.1   Hemoglobin 12.0 - 15.0 g/dL 88.7  88.1  88.3   Hematocrit 36.0 - 46.0 % 33.0  34.3  35.0   Platelets 150 - 400 K/uL 167  220  219        Latest Ref Rng & Units 12/29/2023    8:31 AM 12/18/2023    9:03 AM 12/08/2023    9:00 AM  CMP  Glucose 70 - 99 mg/dL 894  817  898   BUN 8 - 23 mg/dL 16  17  16    Creatinine 0.44 - 1.00 mg/dL 8.87  8.85  9.08   Sodium 135 - 145 mmol/L 133  134  134   Potassium 3.5 - 5.1 mmol/L 4.1  4.1  4.3   Chloride 98 - 111 mmol/L 99  99  99   CO2 22 - 32 mmol/L 22  21  21    Calcium  8.9 - 10.3 mg/dL 9.0  9.4  9.6   Total Protein 6.5 - 8.1 g/dL 6.5  6.6  6.7   Total Bilirubin 0.0 - 1.2 mg/dL 0.3  0.4  0.3   Alkaline Phos 38 - 126 U/L 59  56  70   AST 15 - 41 U/L 24  26  24    ALT 0 - 44 U/L 14  18  15              Component Ref Range & Units (hover) 11 d ago 3 wk ago 4 wk ago 1 mo ago 2 mo ago 3 mo ago 4 mo ago  CA 27.29 101.8 High  96.3 High  CM 87.2 High  CM 69.9 High  CM 52.4 High  CM 47.4 High  CM 42.9 High  CM   No results found for: CEA1, CEA / No results found for: CEA1, CEA No results found for: PSA1 No results found for: CAN199 No results found for: CAN125  No results found for: TOTALPROTELP, ALBUMINELP, A1GS, A2GS, BETS, BETA2SER, GAMS, MSPIKE, SPEI No results found for: TIBC, FERRITIN, IRONPCTSAT Lab Results  Component Value Date   LDH 138 01/17/2011   LDH 155 12/28/2009   LDH 155 12/29/2008   STUDIES:  EXAM: 10/20/2023 NUCLEAR MEDICINE PET SKULL BASE TO THIGH IMPRESSION: 1. Status post right mastectomy. No findings for recurrent tumor. 2. Diffuse sclerotic osseous metastatic disease without focal hypermetabolism to suggest active disease. 3. No findings for visceral metastatic disease in the chest, abdomen or pelvis. 4. Aortic atherosclerosis.  Exam: 07/22/2023 MRI of the Lumbar Spine Impression: Numerous osseous lesions throughout the lower spine, lumbar spine, and pelvis in keeping history of metastatic breast cancer. No pathologic fracture of the lumbar spine or scrum.  Multilevel degenerative disc and facet changes of the  lumbar spine.  At L4-L5, moderate spinal canal stenosis and mild to moderate bilateral foraminal stenosis.  At L2-L3 and L3-L4, mild spinal canal and bilateral foraminal stenosis.  At L5-S1, mild bilateral foraminal stenosis.   MRI of the cervical spine  Impression: Anterior cervical fusion at C3-4 and C5-6. At C3-4 there is a severe left facet arthropathy and moderate bilateral foraminal stenosis and at C5-6 there is a moderate left foraminal stenosis.  C4-5 there has a mild bilateral foraminal stenosis and mild stenosis and C6-7 has a moderate bilateral foraminal stenosis and mild spinal stenosis.  EXAM: 05/26/2023 DIGITAL SCREENING UNILATERAL LEFT MAMMOGRAM WITH CAD AND TOMOSYNTHESIS IMPRESSION: No mammographic evidence of malignancy.   HISTORY:   Past Medical History:  Diagnosis Date   Anxiety    Cancer [facility])    Breast 1997 right tx with mastectomy and chemo, metastatic now   GERD (gastroesophageal reflux disease)    Hypercholesterolemia    Hypertension    Hypothyroidism    Osteoarthritis    oa   Personal history of chemotherapy    Personal history of radiation therapy    Secondary malignant neoplasm of bone and bone marrow (HCC)    TIA (transient ischemic attack) last 09/10/20   x 3 total, they seem to occur every 5 years    Past Surgical History:  Procedure Laterality Date   CATARACT EXTRACTION, BILATERAL     CERVICAL FUSION  2020   MASTECTOMY MODIFIED RADICAL Right 1997   porta cath insertion  1997   later removed   radiation tx  finished 02-25-16   x 20 tx   TONSILLECTOMY     TOTAL HIP ARTHROPLASTY Left 03/12/2016   Procedure: LEFT TOTAL HIP ARTHROPLASTY ANTERIOR APPROACH;  Surgeon: Dempsey Moan, MD;  Location: [facility];  Service: Orthopedics;  Laterality: Left;   TOTAL HIP ARTHROPLASTY Right 04/13/2023   Procedure: TOTAL HIP ARTHROPLASTY ANTERIOR APPROACH;  Surgeon: Moan Dempsey, MD;  Location: [facility];  Service: Orthopedics;  Laterality: Right;   TOTAL KNEE  ARTHROPLASTY Left    TOTAL KNEE ARTHROPLASTY Right 10/30/2021   Procedure: TOTAL KNEE ARTHROPLASTY;  Surgeon: Gerome Charleston, MD;  Location: [facility];  Service: Orthopedics;  Laterality: Right;  adductor canal 120   TUBAL LIGATION      Family History  Problem Relation Age of Onset   Breast cancer Maternal Aunt     Social History:  reports  that she has never smoked. She has never used smokeless tobacco. She reports current alcohol use of about 1.0 standard drink of alcohol per week. She reports that she does not use drugs.The patient is accompanied by her husband today.  Allergies:  Allergies  Allergen Reactions   Oxycodone Other (See Comments)    oxycodone  aspirin  / oxycodone   Oxycodone-Aspirin  Nausea And Vomiting, Nausea Only and Other (See Comments)    aspirin  / oxycodone   Other Nausea And Vomiting, Other (See Comments) and Rash   Oxycodone-Acetaminophen  Other (See Comments)   Acetaminophen  Other (See Comments)   Aspirin  Other (See Comments)   Erythromycin Nausea Only and Other (See Comments)   Oxycodone Hcl Nausea And Vomiting   Tramadol  Itching   Amoxicillin Hives and Dermatitis    Has patient had a PCN reaction causing immediate rash, facial/tongue/throat swelling, SOB or lightheadedness with hypotension:unsure  Has patient had a PCN reaction causing severe rash involving mucus membranes or skin necrosis:No  Has patient had a PCN reaction that required hospitalization:No  Has patient had a PCN reaction occurring within the last 10 years: Yes  If all of the above answers are NO, then may proceed with Cephalosporin use.  Has patient had a PCN reaction causing immediate rash, facial/tongue/throat swelling, SOB or lightheadedness with hypotension:unsure, Has patient had a PCN reaction causing severe rash involving mucus membranes or skin necrosis:No, Has patient had a PCN reaction that required hospitalization:No, Has patient had a PCN reaction occurring within the last 10  years: Yes, If all of the above answers are NO, then may proceed with Cephalosporin use.    Current Medications: Current Outpatient Medications  Medication Sig Dispense Refill   abemaciclib  (VERZENIO ) 100 MG tablet Take 1 tablet (100 mg total) by mouth 2 (two) times daily. 56 tablet 5   acetaminophen  (TYLENOL ) 500 MG tablet Take 1,000 mg by mouth every 6 (six) hours as needed for mild pain.     acidophilus (RISAQUAD) CAPS capsule Take 1 capsule by mouth daily.     ascorbic acid (VITAMIN C) 1000 MG tablet Take 1,000 mg by mouth daily.     aspirin  325 MG tablet Take 325 mg by mouth daily.     beta carotene 15 MG capsule Take 15 mg by mouth daily.     Biotin 5000 MCG TABS Take 5,000 mcg by mouth daily.     CALCIUM  CITRATE-VITAMIN D PO Take 1 tablet by mouth in the morning, at noon, in the evening, and at bedtime.     clopidogrel  (PLAVIX ) 75 MG tablet Take 1 tablet (75 mg total) by mouth daily.     cyanocobalamin  (,VITAMIN B-12,) 1000 MCG/ML injection Inject 1,000 mcg into the muscle every 30 (thirty) days.     Denosumab  (XGEVA  [place]) Inject 1 Dose into the skin every 30 (thirty) days.     famotidine  (PEPCID ) 40 MG tablet Take 40 mg by mouth daily.     glucosamine-chondroitin 500-400 MG tablet Take 1 tablet by mouth daily.     ketotifen (ZADITOR) 0.025 % ophthalmic solution Place 1 drop into both eyes daily as needed (allergies).     levothyroxine  (SYNTHROID , LEVOTHROID) 88 MCG tablet Take 88 mcg by mouth daily before breakfast.     LORazepam  (ATIVAN ) 1 MG tablet Take 1 tablet (1 mg total) by mouth every 8 (eight) hours as needed for anxiety. 30 tablet 0   losartan  (COZAAR ) 25 MG tablet Take 25 mg by mouth daily.     Lutein 20  MG CAPS Take 20 mg by mouth daily.     meclizine  (ANTIVERT ) 25 MG tablet Take 25 mg by mouth 3 (three) times daily as needed for dizziness.     melatonin 5 MG TABS Take 5 mg by mouth at bedtime as needed (sleep).     meloxicam (MOBIC) 7.5 MG tablet Take 7.5 mg by mouth  daily. (Patient not taking: Reported on 12/29/2023)     metroNIDAZOLE  (METROCREAM ) 0.75 % cream Apply 1 Application topically daily.     Multiple Vitamins-Minerals (MULTIVITAMIN WITH MINERALS) tablet Take 1 tablet by mouth daily.     Omega-3 1000 MG CAPS Take 1,000 mg by mouth in the morning and at bedtime.     ondansetron  (ZOFRAN ) 4 MG tablet Take 1 tablet (4 mg total) by mouth every 4 (four) hours as needed for nausea or vomiting. 20 tablet 5   pantoprazole  (PROTONIX ) 40 MG tablet Take 40 mg by mouth daily.     Polyethyl Glycol-Propyl Glycol 0.4-0.3 % SOLN Place 1-2 drops into both eyes 2 (two) times daily.     rOPINIRole  (REQUIP ) 1 MG tablet 1 (ONE) TABLET LATE AFTERNOON OR EARLY EVENING (A FEW HOURS PRIOR TO BEDTIME DOSE)     rOPINIRole  (REQUIP ) 2 MG tablet Take 4 mg by mouth at bedtime. (Patient taking differently: Take 6 mg by mouth at bedtime.)     rosuvastatin  (CRESTOR ) 20 MG tablet Take 1 tablet (20 mg total) by mouth daily.     triamcinolone  cream (KENALOG ) 0.1 % Apply topically 2 (two) times daily as needed.     vitamin E 180 MG (400 UNITS) capsule Take 400 Units by mouth daily.     zinc gluconate 50 MG tablet Take 50 mg by mouth daily.     No current facility-administered medications for this visit.    I,Jasmine M Lassiter,acting as a scribe for [name], MD.,have documented all relevant documentation on the behalf of [name], MD,as directed by  [name], MD while in the presence of [name], MD.

## 2023-12-29 NOTE — Progress Notes (Signed)
 Specialty Pharmacy Ongoing Clinical Assessment Note  Jade Mooney is a 78 y.o. female who is being followed by the specialty pharmacy service for [facility] Oncology   Patient's specialty medication(s) reviewed today: Abemaciclib  (VERZENIO )   Missed doses in the last 4 weeks: 0 (patient took some breaks approved by Dr. Cornelius during travel, did not miss any doses other than those approved breaks)   Patient/Caregiver did not have any additional questions or concerns.   Therapeutic benefit summary: Patient is achieving benefit   Adverse events/side effects summary: Experienced adverse events/side effects (mild tolerable diarrhea and nausea, has Imodium and Zofran  on hand PRN)   Patient's therapy is appropriate to: Continue    Goals Addressed             This Visit's Progress    Maintain optimal adherence to therapy   On track    Patient is initiating therapy. Patient will maintain adherence      Slow Disease Progression   Worsening    Patient is on track. Patient will maintain adherence.  CA 27.29 continues increase, was most recently 101.8 on 12/18/23, Dr. Cornelius is closely monitoring.         Follow up: 3 months  Silvano LOISE Dolly Specialty Pharmacist

## 2023-12-30 LAB — CANCER ANTIGEN 27.29: CA 27.29: 89.5 U/mL — ABNORMAL HIGH (ref 0.0–38.6)

## 2024-01-03 ENCOUNTER — Encounter: Payer: Self-pay | Admitting: Oncology

## 2024-01-11 ENCOUNTER — Other Ambulatory Visit: Payer: Self-pay

## 2024-01-12 ENCOUNTER — Inpatient Hospital Stay

## 2024-01-12 ENCOUNTER — Telehealth: Payer: Self-pay

## 2024-01-12 DIAGNOSIS — C792 Secondary malignant neoplasm of skin: Secondary | ICD-10-CM | POA: Diagnosis not present

## 2024-01-12 DIAGNOSIS — R110 Nausea: Secondary | ICD-10-CM | POA: Diagnosis not present

## 2024-01-12 DIAGNOSIS — C787 Secondary malignant neoplasm of liver and intrahepatic bile duct: Secondary | ICD-10-CM | POA: Diagnosis not present

## 2024-01-12 DIAGNOSIS — C50919 Malignant neoplasm of unspecified site of unspecified female breast: Secondary | ICD-10-CM | POA: Diagnosis not present

## 2024-01-12 DIAGNOSIS — C7951 Secondary malignant neoplasm of bone: Secondary | ICD-10-CM | POA: Diagnosis not present

## 2024-01-12 DIAGNOSIS — R197 Diarrhea, unspecified: Secondary | ICD-10-CM | POA: Diagnosis not present

## 2024-01-12 LAB — CBC WITH DIFFERENTIAL (CANCER CENTER ONLY)
Abs Immature Granulocytes: 0.07 K/uL (ref 0.00–0.07)
Basophils Absolute: 0.1 K/uL (ref 0.0–0.1)
Basophils Relative: 1 %
Eosinophils Absolute: 0.2 K/uL (ref 0.0–0.5)
Eosinophils Relative: 4 %
HCT: 33.1 % — ABNORMAL LOW (ref 36.0–46.0)
Hemoglobin: 11 g/dL — ABNORMAL LOW (ref 12.0–15.0)
Immature Granulocytes: 2 %
Lymphocytes Relative: 23 %
Lymphs Abs: 0.9 K/uL (ref 0.7–4.0)
MCH: 29.1 pg (ref 26.0–34.0)
MCHC: 33.2 g/dL (ref 30.0–36.0)
MCV: 87.6 fL (ref 80.0–100.0)
Monocytes Absolute: 0.6 K/uL (ref 0.1–1.0)
Monocytes Relative: 15 %
Neutro Abs: 2.3 K/uL (ref 1.7–7.7)
Neutrophils Relative %: 55 %
Platelet Count: 202 K/uL (ref 150–400)
RBC: 3.78 MIL/uL — ABNORMAL LOW (ref 3.87–5.11)
RDW: 15.7 % — ABNORMAL HIGH (ref 11.5–15.5)
WBC Count: 4.1 K/uL (ref 4.0–10.5)
nRBC: 0 % (ref 0.0–0.2)

## 2024-01-12 LAB — CMP (CANCER CENTER ONLY)
ALT: 19 U/L (ref 0–44)
AST: 24 U/L (ref 15–41)
Albumin: 4.3 g/dL (ref 3.5–5.0)
Alkaline Phosphatase: 59 U/L (ref 38–126)
Anion gap: 13 (ref 5–15)
BUN: 14 mg/dL (ref 8–23)
CO2: 23 mmol/L (ref 22–32)
Calcium: 9.5 mg/dL (ref 8.9–10.3)
Chloride: 99 mmol/L (ref 98–111)
Creatinine: 0.98 mg/dL (ref 0.44–1.00)
GFR, Estimated: 58 mL/min — ABNORMAL LOW (ref >60)
Glucose, Bld: 153 mg/dL — ABNORMAL HIGH (ref 70–99)
Potassium: 3.9 mmol/L (ref 3.5–5.1)
Sodium: 135 mmol/L (ref 135–145)
Total Bilirubin: 0.4 mg/dL (ref 0.0–1.2)
Total Protein: 6.6 g/dL (ref 6.5–8.1)

## 2024-01-12 NOTE — Telephone Encounter (Signed)
 Pt has called to report she started having diarrhea while on cruise last Wednesday, as well as a few waves of nausea. She took Imodium and antiemetic. She had stopped the Verzenio  prior to cruise.  She restarted her Verzenio  on Sun 01/10/2024. She is still having diarrhea, about 3 episodes/day. She doesn't feel bad, not achy, and is afebrile. She is wondering if she contracted a GI bug on the trip, as she hasn't had diarrhea with the Verzenio  before. I asked if her diarrhea had different odor than normal. She replied, Yes. She has not taken any Imodium since she has been home. Her spouse nor the other couple travelling with her have any GI s/s. She doesn't want to stop the Verzenio . The only other side effect she has is fatigue, which is not new, and improves with rest. I told her the importance of drinking a cup of fluid for each diarrheal stool to avoid dehydration. She voiced understanding. She is able to eat and drink. I told her I would send message to you for your advice.

## 2024-01-13 ENCOUNTER — Other Ambulatory Visit: Payer: Self-pay

## 2024-01-13 ENCOUNTER — Inpatient Hospital Stay

## 2024-01-13 ENCOUNTER — Other Ambulatory Visit: Payer: Self-pay | Admitting: Oncology

## 2024-01-13 DIAGNOSIS — R197 Diarrhea, unspecified: Secondary | ICD-10-CM | POA: Diagnosis not present

## 2024-01-13 DIAGNOSIS — C792 Secondary malignant neoplasm of skin: Secondary | ICD-10-CM | POA: Diagnosis not present

## 2024-01-13 DIAGNOSIS — C787 Secondary malignant neoplasm of liver and intrahepatic bile duct: Secondary | ICD-10-CM | POA: Diagnosis not present

## 2024-01-13 DIAGNOSIS — C7951 Secondary malignant neoplasm of bone: Secondary | ICD-10-CM | POA: Diagnosis not present

## 2024-01-13 DIAGNOSIS — R110 Nausea: Secondary | ICD-10-CM | POA: Diagnosis not present

## 2024-01-13 DIAGNOSIS — C50919 Malignant neoplasm of unspecified site of unspecified female breast: Secondary | ICD-10-CM | POA: Diagnosis not present

## 2024-01-13 LAB — GASTROINTESTINAL PANEL BY PCR, STOOL (REPLACES STOOL CULTURE)

## 2024-01-13 LAB — CANCER ANTIGEN 27.29: CA 27.29: 85.7 U/mL — ABNORMAL HIGH (ref 0.0–38.6)

## 2024-01-14 DIAGNOSIS — M25511 Pain in right shoulder: Secondary | ICD-10-CM | POA: Diagnosis not present

## 2024-01-14 DIAGNOSIS — M25811 Other specified joint disorders, right shoulder: Secondary | ICD-10-CM | POA: Diagnosis not present

## 2024-01-18 ENCOUNTER — Telehealth: Payer: Self-pay

## 2024-01-18 NOTE — Telephone Encounter (Signed)
 Pt called to let us  know she hasn't had any further diarrhea since last Thursday, 01/14/24. She plans to restart her Verzenio  tonight, 01/18/24.

## 2024-01-21 DIAGNOSIS — M25819 Other specified joint disorders, unspecified shoulder: Secondary | ICD-10-CM | POA: Diagnosis not present

## 2024-01-21 DIAGNOSIS — M25511 Pain in right shoulder: Secondary | ICD-10-CM | POA: Diagnosis not present

## 2024-01-21 DIAGNOSIS — Z23 Encounter for immunization: Secondary | ICD-10-CM | POA: Diagnosis not present

## 2024-01-21 DIAGNOSIS — E538 Deficiency of other specified B group vitamins: Secondary | ICD-10-CM | POA: Diagnosis not present

## 2024-01-26 ENCOUNTER — Telehealth: Payer: Self-pay | Admitting: Oncology

## 2024-01-26 ENCOUNTER — Encounter: Payer: Self-pay | Admitting: Oncology

## 2024-01-26 ENCOUNTER — Telehealth: Payer: Self-pay

## 2024-01-26 ENCOUNTER — Inpatient Hospital Stay: Attending: Oncology

## 2024-01-26 ENCOUNTER — Other Ambulatory Visit: Payer: Self-pay | Admitting: Oncology

## 2024-01-26 ENCOUNTER — Inpatient Hospital Stay

## 2024-01-26 ENCOUNTER — Inpatient Hospital Stay: Admitting: Oncology

## 2024-01-26 VITALS — BP 148/80 | HR 67 | Temp 98.5°F | Resp 18 | Ht 62.0 in | Wt 184.8 lb

## 2024-01-26 DIAGNOSIS — C50919 Malignant neoplasm of unspecified site of unspecified female breast: Secondary | ICD-10-CM | POA: Diagnosis present

## 2024-01-26 DIAGNOSIS — Z1721 Progesterone receptor positive status: Secondary | ICD-10-CM | POA: Diagnosis not present

## 2024-01-26 DIAGNOSIS — Z1732 Human epidermal growth factor receptor 2 negative status: Secondary | ICD-10-CM | POA: Diagnosis not present

## 2024-01-26 DIAGNOSIS — Z170 Estrogen receptor positive status [ER+]: Secondary | ICD-10-CM | POA: Diagnosis not present

## 2024-01-26 DIAGNOSIS — C50912 Malignant neoplasm of unspecified site of left female breast: Secondary | ICD-10-CM

## 2024-01-26 DIAGNOSIS — Z5111 Encounter for antineoplastic chemotherapy: Secondary | ICD-10-CM | POA: Diagnosis present

## 2024-01-26 DIAGNOSIS — C7951 Secondary malignant neoplasm of bone: Secondary | ICD-10-CM | POA: Insufficient documentation

## 2024-01-26 DIAGNOSIS — J209 Acute bronchitis, unspecified: Secondary | ICD-10-CM

## 2024-01-26 LAB — CBC WITH DIFFERENTIAL (CANCER CENTER ONLY)
Abs Immature Granulocytes: 0.02 K/uL (ref 0.00–0.07)
Basophils Absolute: 0.1 K/uL (ref 0.0–0.1)
Basophils Relative: 1 %
Eosinophils Absolute: 0.2 K/uL (ref 0.0–0.5)
Eosinophils Relative: 4 %
HCT: 33.1 % — ABNORMAL LOW (ref 36.0–46.0)
Hemoglobin: 10.8 g/dL — ABNORMAL LOW (ref 12.0–15.0)
Immature Granulocytes: 1 %
Lymphocytes Relative: 24 %
Lymphs Abs: 0.9 K/uL (ref 0.7–4.0)
MCH: 28.7 pg (ref 26.0–34.0)
MCHC: 32.6 g/dL (ref 30.0–36.0)
MCV: 88 fL (ref 80.0–100.0)
Monocytes Absolute: 0.5 K/uL (ref 0.1–1.0)
Monocytes Relative: 12 %
Neutro Abs: 2.3 K/uL (ref 1.7–7.7)
Neutrophils Relative %: 58 %
Platelet Count: 203 K/uL (ref 150–400)
RBC: 3.76 MIL/uL — ABNORMAL LOW (ref 3.87–5.11)
RDW: 15.7 % — ABNORMAL HIGH (ref 11.5–15.5)
WBC Count: 3.9 K/uL — ABNORMAL LOW (ref 4.0–10.5)
nRBC: 0 % (ref 0.0–0.2)

## 2024-01-26 LAB — CMP (CANCER CENTER ONLY)
ALT: 16 U/L (ref 0–44)
AST: 23 U/L (ref 15–41)
Albumin: 4.3 g/dL (ref 3.5–5.0)
Alkaline Phosphatase: 56 U/L (ref 38–126)
Anion gap: 12 (ref 5–15)
BUN: 18 mg/dL (ref 8–23)
CO2: 23 mmol/L (ref 22–32)
Calcium: 9.3 mg/dL (ref 8.9–10.3)
Chloride: 97 mmol/L — ABNORMAL LOW (ref 98–111)
Creatinine: 1.18 mg/dL — ABNORMAL HIGH (ref 0.44–1.00)
GFR, Estimated: 47 mL/min — ABNORMAL LOW (ref >60)
Glucose, Bld: 76 mg/dL (ref 70–99)
Potassium: 4.2 mmol/L (ref 3.5–5.1)
Sodium: 132 mmol/L — ABNORMAL LOW (ref 135–145)
Total Bilirubin: 0.4 mg/dL (ref 0.0–1.2)
Total Protein: 6.5 g/dL (ref 6.5–8.1)

## 2024-01-26 MED ORDER — DENOSUMAB 120 MG/1.7ML ~~LOC~~ SOLN
120.0000 mg | Freq: Once | SUBCUTANEOUS | Status: AC
  Administered 2024-01-26: 120 mg via SUBCUTANEOUS
  Filled 2024-01-26: qty 1.7

## 2024-01-26 MED ORDER — FULVESTRANT 250 MG/5ML IM SOSY
500.0000 mg | PREFILLED_SYRINGE | Freq: Once | INTRAMUSCULAR | Status: AC
  Administered 2024-01-26: 500 mg via INTRAMUSCULAR
  Filled 2024-01-26: qty 10

## 2024-01-26 MED ORDER — AZITHROMYCIN 500 MG PO TABS: 500.0000 mg | ORAL_TABLET | Freq: Every day | ORAL | 0 refills | Status: DC

## 2024-01-26 NOTE — Patient Instructions (Signed)
 Fulvestrant Injection What is this medication? FULVESTRANT (ful VES trant) treats breast cancer. It works by blocking the hormone estrogen in breast tissue, which prevents breast cancer cells from spreading or growing. This medicine may be used for other purposes; ask your health care provider or pharmacist if you have questions. COMMON BRAND NAME(S): FASLODEX What should I tell my care team before I take this medication? They need to know if you have any of these conditions: Bleeding disorder Liver disease Low blood cell levels (white cells, red cells, and platelets) An unusual or allergic reaction to fulvestrant, other medications, foods, dyes, or preservatives Pregnant or trying to get pregnant Breastfeeding How should I use this medication? This medication is injected into a muscle. It is given by your care team in a hospital or clinic setting. Talk to your care team about the use of this medication in children. Special care may be needed. Overdosage: If you think you have taken too much of this medicine contact a poison control center or emergency room at once. NOTE: This medicine is only for you. Do not share this medicine with others. What if I miss a dose? Keep appointments for follow-up doses. It is important not to miss your dose. Call your care team if you are unable to keep an appointment. What may interact with this medication? Fluoroestradiol F18 This list may not describe all possible interactions. Give your health care provider a list of all the medicines, herbs, non-prescription drugs, or dietary supplements you use. Also tell them if you smoke, drink alcohol, or use illegal drugs. Some items may interact with your medicine. What should I watch for while using this medication? Your condition will be monitored carefully while you are receiving this medication. You may need blood work done while you are taking this medication. This medication is injected into a muscle. Talk  to your care team if you also take medications that prevent or treat blood clots, such as warfarin. Blood thinners may increase the risk of bleeding or bruising in the muscle where this medication is injected. The benefits of this medication may outweigh the risks. Your care team can help you find the option that works for you. They can also help limit the risk of bleeding. Talk to your care team if you may be pregnant. Serious birth defects can occur if you take this medication during pregnancy and for 1 year after the last dose. You will need a negative pregnancy test before starting this medication. Contraception is recommended while taking this medication and for 1 year after the last dose. Your care team can help you find the option that works for you. Do not breastfeed while taking this medication and for 1 year after the last dose. This medication may cause infertility. Talk to your care team if you are concerned about your fertility. What side effects may I notice from receiving this medication? Side effects that you should report to your care team as soon as possible: Allergic reactions or angioedema--skin rash, itching or hives, swelling of the face, eyes, lips, tongue, arms, or legs, trouble swallowing or breathing Pain, tingling, or numbness in the hands or feet Side effects that usually do not require medical attention (report to your care team if they continue or are bothersome): Bone, joint, or muscle pain Constipation Headache Hot flashes Nausea Pain, redness, or irritation at injection site Unusual weakness or fatigue This list may not describe all possible side effects. Call your doctor for medical advice about side  effects. You may report side effects to FDA at 1-800-FDA-1088. Where should I keep my medication? This medication is given in a hospital or clinic. It will not be stored at home. NOTE: This sheet is a summary. It may not cover all possible information. If you have  questions about this medicine, talk to your doctor, pharmacist, or health care provider.  2024 [employer] Standard (2022-11-14 00:00:00)Denosumab Injection (Oncology) What is this medication? DENOSUMAB (den oh SUE mab) prevents weakened bones caused by cancer. It may also be used to treat noncancerous bone tumors that cannot be removed by surgery. It can also be used to treat high calcium levels in the blood caused by cancer. It works by blocking a protein that causes bones to break down quickly. This slows down the release of calcium from bones, which lowers calcium levels in your blood. It also makes your bones stronger and less likely to break (fracture). This medicine may be used for other purposes; ask your health care provider or pharmacist if you have questions. COMMON BRAND NAME(S): XGEVA What should I tell my care team before I take this medication? They need to know if you have any of these conditions: Dental disease Having surgery or tooth extraction Infection Kidney disease Low levels of calcium or vitamin D in the blood Malnutrition On hemodialysis Skin conditions or sensitivity Thyroid or parathyroid disease An unusual reaction to denosumab, other medications, foods, dyes, or preservatives Pregnant or trying to get pregnant Breast-feeding How should I use this medication? This medication is for injection under the skin. It is given by your care team in a hospital or clinic setting. A special MedGuide will be given to you before each treatment. Be sure to read this information carefully each time. Talk to your care team about the use of this medication in children. While it may be prescribed for children as young as 13 years for selected conditions, precautions do apply. Overdosage: If you think you have taken too much of this medicine contact a poison control center or emergency room at once. NOTE: This medicine is only for you. Do not share this medicine with others. What  if I miss a dose? Keep appointments for follow-up doses. It is important not to miss your dose. Call your care team if you are unable to keep an appointment. What may interact with this medication? Do not take this medication with any of the following: Other medications containing denosumab This medication may also interact with the following: Medications that lower your chance of fighting infection Steroid medications, such as prednisone or cortisone This list may not describe all possible interactions. Give your health care provider a list of all the medicines, herbs, non-prescription drugs, or dietary supplements you use. Also tell them if you smoke, drink alcohol, or use illegal drugs. Some items may interact with your medicine. What should I watch for while using this medication? Your condition will be monitored carefully while you are receiving this medication. You may need blood work while taking this medication. This medication may increase your risk of getting an infection. Call your care team for advice if you get a fever, chills, sore throat, or other symptoms of a cold or flu. Do not treat yourself. Try to avoid being around people who are sick. You should make sure you get enough calcium and vitamin D while you are taking this medication, unless your care team tells you not to. Discuss the foods you eat and the vitamins you take with your  care team. Some people who take this medication have severe bone, joint, or muscle pain. This medication may also increase your risk for jaw problems or a broken thigh bone. Tell your care team right away if you have severe pain in your jaw, bones, joints, or muscles. Tell your care team if you have any pain that does not go away or that gets worse. Talk to your care team if you may be pregnant. Serious birth defects can occur if you take this medication during pregnancy and for 5 months after the last dose. You will need a negative pregnancy test before  starting this medication. Contraception is recommended while taking this medication and for 5 months after the last dose. Your care team can help you find the option that works for you. What side effects may I notice from receiving this medication? Side effects that you should report to your care team as soon as possible: Allergic reactions--skin rash, itching, hives, swelling of the face, lips, tongue, or throat Bone, joint, or muscle pain Low calcium level--muscle pain or cramps, confusion, tingling, or numbness in the hands or feet Osteonecrosis of the jaw--pain, swelling, or redness in the mouth, numbness of the jaw, poor healing after dental work, unusual discharge from the mouth, visible bones in the mouth Side effects that usually do not require medical attention (report to your care team if they continue or are bothersome): Cough Diarrhea Fatigue Headache Nausea This list may not describe all possible side effects. Call your doctor for medical advice about side effects. You may report side effects to FDA at 1-800-FDA-1088. Where should I keep my medication? This medication is given in a hospital or clinic. It will not be stored at home. NOTE: This sheet is a summary. It may not cover all possible information. If you have questions about this medicine, talk to your doctor, pharmacist, or health care provider.  2024 [employer] Standard (2021-07-31 00:00:00)

## 2024-01-26 NOTE — Telephone Encounter (Signed)
 Patient has been scheduled for follow-up visit per 04/07/23 LOS.  Pt given an appt calendar with date and time.

## 2024-01-26 NOTE — Progress Notes (Signed)
 [facility]  [address] [place],  [place]  [address] [phone]  Clinic Day: 01/26/24  Referring physician: Rusty Lonni HERO, *  ASSESSMENT & PLAN:  Assessment: Primary malignant neoplasm of breast with metastasis [place]) Patient has a complicated oncology history history of invasive ductal carcinoma of the right breast diagnosed in 1997.  She was treated with mastectomy and adjuvant chemotherapy.  She also participated in clinical trial using adjuvant high-dose chemotherapy followed by stem cell transplant.  She was treated with adjuvant tamoxifen for 5 years.  She developed biopsy-proven liver metastasis, estrogen and HER2 receptor positive, in November 2007.  She received trastuzumab for an unknown period of time.  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  She was started on zoledronic  acid in July and anastrozole  in August, 2015.    She developed a metastatic lesion of the right anterior chest in August, 2017 confirmed to be adenocarcinoma and treated with excision and radiation.  Anastrozole  was discontinued and she was switched to letrozole .  Palbociclib  was added in January, 2018 after PET imaging revealed 2 new foci at T8 and the manubrium.  The dose of palbociclib  had to be decreased over time.  Zoledronic  acid was discontinued in January, 2019.  Letrozole  was discontinued due to progression and was started on fulvestrant  in February 2019 due to progression. She received palliative radiation to the cervical spine in February, 2019. Palbociclib  was held during radiation and resumed at 75 mg daily once completed. Denosumab  was started in March, 2019.  She has remained on this regimen without evidence of progression.  A PET in January, 2025 was stable, with evidence of treated bone metastases. She had significant neutropenia with the Ibrance  even taking it every other day and so we could not increase the dose any  further. Now with her rising CA 27.29, we suspected her cancer may be becoming resistant and so a PET scan was done on 10/20/2023, which revealed diffuse sclerotic osseous metastatic disease without focal hypermetabolism and no findings for visceral metastatic disease in the chest, abdomen or pelvis. When the CA 27.29 increased from 69.9 to 87.2, we stopped the Ibrance  and changed to Verzenio  100 mg BID along with the fluvestrant injections. The tumor marker continued to increase to 101.8 but finally has been decreasing to 89.5 and 85.7 by October, 21st.   Severe Osteoarthritis/Degenerative Disc Disease  She has already had knee replacement and right hip replacement. 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 She is now undergoing physical therapy with some improvement of her back pain. She is now also having right shoulder pain and PT is ordered.   Plan:  I will prescribe Zithromax  500 mg for 10 days once daily for her persistent cough with yellow phlegm. She continues Verzenio  100 mg BID and is tolerating it well. She has a low WBC of 3.9 with an ANC of 2300, low hemoglobin of 10.8 down from 11.0, and platelet count of 203,000. Her CMP is normal other than a mildly low sodium of 132 and elevated creatinine of 1.18 up from 0.98. I encouraged her to increase her fluid intake. Her CA 27.29 today is pending and her last readings have continued to decrease from 101.8 to 89.5 to 85.7 as of 01/12/2024. She will receive her Xgeva  and faslodex  injections today. I will plan to go to monthly follow-up and labs now. I will  see her back in 4 weeks with CBC, CMP, CA 27.29, Xgeva , and Faslodex  injection and 8 weeks with the same labs. We schedule a PET for December, 22nd so we will have the result when I see her on the 30th. The patient understands the plans discussed today and is in agreement with them.  She knows to contact our office if she develops  concerns prior to her next appointment.  I provided 30 minutes of face-to-face time during this encounter and > 50% was spent counseling as documented under my assessment and plan.   [name], MD [facility] A DEPT OF [facility] [address] [place] [address] Dept: [phone] Dept Fax: [phone]   No orders of the defined types were placed in this encounter.   CHIEF COMPLAINT:  CC: Recurrent hormone receptor positive breast cancer   Current Treatment:  Palbociclib /fulvestrant /denosumab    HISTORY OF PRESENT ILLNESS:  Jade Mooney is a 78 y.o. female who we began seeing in January 2023 for transfer of care from Dr. Sandria Gell for the continued treatment and management of metastatic breast cancer. She was originally diagnosed with stage III hormone receptor positive right breast cancer in 1997.  She was treated with right mastectomy and adjuvant chemotherapy with Taxotere for 4 cycles.  She also participated in a [facility] protocol with high-dose chemotherapy, cyclophosphamide/carboplatin/BCNU, followed by stem cell rescue in September 1997. She completed 5 years of tamoxifen in November 2002.  Unfortunately, she developed metastatic liver lesions, biopsy proven metastatic breast cancer, estrogen receptor positive and HER2 receptor positive, in November 2007.  She was treated with trastuzumab for an unknown period of time.    In July 2015, she was found to have incidental findings of mixed lytic and sclerotic bony lesions on CT abdomen and pelvis. She underwent biopsy of the right iliac bone and surgical pathology confirmed invasive ductal carcinoma, grade 2, estrogen and progesterone receptor positive. Bone density from May 2015 was normal. She was started on zolendronic acid every 12 weeks in July 2015. She was also found to have three scalp lesions, biopsied in July 2015 confirming metastatic breast  cancer. HER2 negative. She was started on anastrozole  in August 2015.   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     Anastrozole  was discontinued and she was switched to letrozole  in April 2018.  Palbociclib  was added in January 2018 after PET imaging revealed 2 new foci at T8 and the manubrium.   Zoledronic  acid was discontinued in January 2019.  Letrozole  was discontinued in February 2019 due to progression. She was started on fulvestrant  in February 2019. She received palliative radiation to the cervical spine in February 2019. Palbociclib  was held during that time and resumed at 75 mg daily once completed. Denosumab  was started in March 2019.    She has continued on palbociclib  75 mg daily for 3 weeks on and 1 week off, along with fulvestrant  and denosumab  every 4 weeks.  Most recent imaging was a PET scan in December 2023 revealed significant interval change in the diffuse osteoblastic metastatic lesions in the axial and proximal appendicular skeleton without abnormal FDG avidity compatible with chronic treated disease. No evidence of hypermetabolic local recurrent breast cancer or viable distant metastatic disease.    Oncology History  Breast cancer metastasized to bone (HCC)  10/12/2013 Initial Diagnosis  Breast cancer metastasized to bone [facility])   02/02/2020 - 11/14/2021 Chemotherapy   Patient is on Treatment Plan : BREAST FULVESTRANT  & XGEVA  Q28D     Primary malignant neoplasm of breast with metastasis (HCC)  07/12/2015 Initial Diagnosis   Metastatic breast cancer (HCC)   02/02/2020 - 11/14/2021 Chemotherapy   Patient is on Treatment Plan : BREAST FULVESTRANT  & XGEVA  Q28D       INTERVAL HISTORY:  Natalina is here today for clinical assessment for recurrent hormone receptor positive breast cancer metastatic to bone for many years. Patient states that she feels ok but complains of an  persistent cough with yellow phlegm, sinus drainage, increased fatigue, back pain, and improved diarrhea. I will prescribe Zithromax  500 mg for 10 days once daily for her cough. She continues Verzenio  100 mg BID and is tolerating it well. She has a low WBC of 3.9 with an ANC of 2300, low hemoglobin of 10.8 down from 11.0, and platelet count of 203,000. Her CMP is normal other than a mildly low sodium of 132 and elevated creatinine of 1.18 up from 0.98. I encouraged her to increase her fluid intake. Her CA 27.29 today is pending and her last readings have continued to decrease from 101.8 to 89.5 to 85.7 as of 01/12/2024. She will receive her Xgeva  and faslodex  injections today. I will plan to go to monthly follow-up and labs now. I will see her back in 4 weeks with CBC, CMP, CA 27.29, Xgeva , and Faslodex  injection and 8 weeks with the same labs and injections. We schedule a PET for December, 22nd so we will have the result when I see her on the 30th.   She denies fever, chills, night sweats, or other signs of infection. She denies cardiorespiratory issues. Her appetite is good and Her weight has increased 3 pounds over last month. This patient is accompanied in the office by her husband.  REVIEW OF SYSTEMS:  Review of Systems  Constitutional:  Positive for fatigue. Negative for appetite change, chills, diaphoresis, fever and unexpected weight change.  HENT:  Negative.  Negative for hearing loss, lump/mass, mouth sores, nosebleeds, sore throat, tinnitus, trouble swallowing and voice change.   Eyes: Negative.  Negative for eye problems and icterus.  Respiratory:  Positive for cough (with yellow phlegm). Negative for chest tightness, hemoptysis, shortness of breath and wheezing.   Cardiovascular:  Negative for chest pain, leg swelling and palpitations.  Gastrointestinal:  Positive for diarrhea (improved) and nausea (mild). Negative for abdominal distention, abdominal pain, blood in stool, constipation, rectal  pain and vomiting.  Endocrine: Negative.   Genitourinary: Negative.  Negative for bladder incontinence, difficulty urinating, dyspareunia, dysuria, frequency, hematuria, menstrual problem, nocturia, pelvic pain, vaginal bleeding and vaginal discharge.   Musculoskeletal:  Positive for arthralgias, back pain (chronic) and myalgias. Negative for flank pain, gait problem, neck pain and neck stiffness.       Chronic occasional right shoulder pain Sacroiliac pain  Skin: Negative.  Negative for itching, rash and wound.  Neurological:  Negative for dizziness, extremity weakness, gait problem, headaches, light-headedness, numbness, seizures and speech difficulty.  Hematological: Negative.  Negative for adenopathy. Does not bruise/bleed easily.  Psychiatric/Behavioral: Negative.  Negative for confusion, decreased concentration, depression, sleep disturbance and suicidal ideas. The patient is not nervous/anxious.     VITALS:  Blood pressure (!) 148/80, pulse 67, temperature 98.5 F (36.9 C), temperature source Oral, resp. rate 18, height 5' 2 (1.575 m), weight 184 lb 12.8 oz (83.8 kg), SpO2 99%.  Wt Readings from Last 3 Encounters:  01/26/24 184 lb 12.8 oz (83.8 kg)  12/29/23 181 lb 6.4 oz (82.3 kg)  12/08/23 183 lb 8 oz (83.2 kg)    Body mass index is 33.8 kg/m.  Performance status (ECOG): 1 - Symptomatic but completely ambulatory  PHYSICAL EXAM:  Physical Exam Vitals and nursing note reviewed. Exam conducted with a chaperone present.  Constitutional:      General: She is not in acute distress.    Appearance: Normal appearance. She is normal weight. She is not ill-appearing, toxic-appearing or diaphoretic.  HENT:     Head: Normocephalic and atraumatic.     Right Ear: Tympanic membrane, ear canal and external ear normal. There is no impacted cerumen.     Left Ear: Tympanic membrane, ear canal and external ear normal. There is no impacted cerumen.     Nose: Nose normal. No congestion or  rhinorrhea.     Mouth/Throat:     Mouth: Mucous membranes are moist.     Pharynx: Oropharynx is clear. No oropharyngeal exudate or posterior oropharyngeal erythema.  Eyes:     General: No scleral icterus.       Right eye: No discharge.        Left eye: No discharge.     Extraocular Movements: Extraocular movements intact.     Conjunctiva/sclera: Conjunctivae normal.     Pupils: Pupils are equal, round, and reactive to light.  Neck:     Vascular: No carotid bruit.  Cardiovascular:     Rate and Rhythm: Normal rate and regular rhythm.     Pulses: Normal pulses.     Heart sounds: Normal heart sounds. No murmur heard.    No friction rub. No gallop.  Pulmonary:     Effort: Pulmonary effort is normal. No respiratory distress.     Breath sounds: Normal breath sounds. No stridor. No wheezing, rhonchi or rales.  Chest:     Chest wall: No tenderness.     Comments: Right mastectomy is negative Left breast is without masses Well healed scar in the upper inner quadrant of the left breast with a tiny nodule just medial to it (stable finding) Abdominal:     General: Bowel sounds are normal. There is no distension.     Palpations: Abdomen is soft. There is no hepatomegaly, splenomegaly or mass.     Tenderness: There is no abdominal tenderness. There is no right CVA tenderness, left CVA tenderness, guarding or rebound.     Hernia: No hernia is present.     Comments: Liver feels normal  Musculoskeletal:        General: No swelling, deformity or signs of injury. Normal range of motion.     Cervical back: Normal range of motion and neck supple. No rigidity or tenderness.     Right lower leg: No edema.     Left lower leg: No edema.     Comments: Area of hard induration of the skin which is circumferential at the level of the knee  Lymphadenopathy:     Cervical: No cervical adenopathy.     Right cervical: No superficial, deep or posterior cervical adenopathy.    Left cervical: No superficial, deep  or posterior cervical adenopathy.     Upper Body:     Right upper body: No supraclavicular, axillary or pectoral adenopathy.     Left upper body: No supraclavicular, axillary or pectoral adenopathy.  Skin:    General: Skin is warm and dry.  Coloration: Skin is not jaundiced or pale.     Findings: Ecchymosis present. No bruising, erythema, lesion or rash.     Comments: Hematoma of the left lower leg, medial calf and resolving ecchymosis of the left medial foot  Neurological:     General: No focal deficit present.     Mental Status: She is alert and oriented to person, place, and time. Mental status is at baseline.     Cranial Nerves: No cranial nerve deficit.     Sensory: No sensory deficit.     Motor: No weakness.     Coordination: Coordination normal.     Gait: Gait normal.     Deep Tendon Reflexes: Reflexes normal.  Psychiatric:        Mood and Affect: Mood normal.        Behavior: Behavior normal.        Thought Content: Thought content normal.        Judgment: Judgment normal.    LABS:      Latest Ref Rng & Units 01/26/2024   10:29 AM 01/12/2024    9:59 AM 12/29/2023    8:31 AM  CBC  WBC 4.0 - 10.5 K/uL 3.9  4.1  3.2   Hemoglobin 12.0 - 15.0 g/dL 89.1  88.9  88.7   Hematocrit 36.0 - 46.0 % 33.1  33.1  33.0   Platelets 150 - 400 K/uL 203  202  167       Latest Ref Rng & Units 01/26/2024   10:29 AM 01/12/2024    9:59 AM 12/29/2023    8:31 AM  CMP  Glucose 70 - 99 mg/dL 76  846  894   BUN 8 - 23 mg/dL 18  14  16    Creatinine 0.44 - 1.00 mg/dL 8.81  9.01  8.87   Sodium 135 - 145 mmol/L 132  135  133   Potassium 3.5 - 5.1 mmol/L 4.2  3.9  4.1   Chloride 98 - 111 mmol/L 97  99  99   CO2 22 - 32 mmol/L 23  23  22    Calcium  8.9 - 10.3 mg/dL 9.3  9.5  9.0   Total Protein 6.5 - 8.1 g/dL 6.5  6.6  6.5   Total Bilirubin 0.0 - 1.2 mg/dL 0.4  0.4  0.3   Alkaline Phos 38 - 126 U/L 56  59  59   AST 15 - 41 U/L 23  24  24    ALT 0 - 44 U/L 16  19  14               Component Ref Range & Units (hover) 11 d ago 3 wk ago 4 wk ago 1 mo ago 2 mo ago 3 mo ago 4 mo ago  CA 27.29 101.8 High  96.3 High  CM 87.2 High  CM 69.9 High  CM 52.4 High  CM 47.4 High  CM 42.9 High  CM   No results found for: CEA1, CEA / No results found for: CEA1, CEA No results found for: PSA1 No results found for: CAN199 No results found for: CAN125  No results found for: TOTALPROTELP, ALBUMINELP, A1GS, A2GS, BETS, BETA2SER, GAMS, MSPIKE, SPEI No results found for: TIBC, FERRITIN, IRONPCTSAT Lab Results  Component Value Date   LDH 138 01/17/2011   LDH 155 12/28/2009   LDH 155 12/29/2008   STUDIES:  EXAM: 10/20/2023 NUCLEAR MEDICINE PET SKULL BASE TO THIGH IMPRESSION: 1. Status post right mastectomy. No findings for recurrent  tumor. 2. Diffuse sclerotic osseous metastatic disease without focal hypermetabolism to suggest active disease. 3. No findings for visceral metastatic disease in the chest, abdomen or pelvis. 4. Aortic atherosclerosis.  HISTORY:   Past Medical History:  Diagnosis Date   Anxiety    Cancer [facility])    Breast 1997 right tx with mastectomy and chemo, metastatic now   GERD (gastroesophageal reflux disease)    Hypercholesterolemia    Hypertension    Hypothyroidism    Osteoarthritis    oa   Personal history of chemotherapy    Personal history of radiation therapy    Secondary malignant neoplasm of bone and bone marrow (HCC)    TIA (transient ischemic attack) last 09/10/20   x 3 total, they seem to occur every 5 years    Past Surgical History:  Procedure Laterality Date   CATARACT EXTRACTION, BILATERAL     CERVICAL FUSION  2020   MASTECTOMY MODIFIED RADICAL Right 1997   porta cath insertion  1997   later removed   radiation tx  finished 02-25-16   x 20 tx   TONSILLECTOMY     TOTAL HIP ARTHROPLASTY Left 03/12/2016   Procedure: LEFT TOTAL HIP ARTHROPLASTY ANTERIOR APPROACH;  Surgeon: Dempsey Moan, MD;   Location: [facility];  Service: Orthopedics;  Laterality: Left;   TOTAL HIP ARTHROPLASTY Right 04/13/2023   Procedure: TOTAL HIP ARTHROPLASTY ANTERIOR APPROACH;  Surgeon: Moan Dempsey, MD;  Location: [facility];  Service: Orthopedics;  Laterality: Right;   TOTAL KNEE ARTHROPLASTY Left    TOTAL KNEE ARTHROPLASTY Right 10/30/2021   Procedure: TOTAL KNEE ARTHROPLASTY;  Surgeon: Gerome Charleston, MD;  Location: [facility];  Service: Orthopedics;  Laterality: Right;  adductor canal 120   TUBAL LIGATION      Family History  Problem Relation Age of Onset   Breast cancer Maternal Aunt     Social History:  reports that she has never smoked. She has never used smokeless tobacco. She reports current alcohol use of about 1.0 standard drink of alcohol per week. She reports that she does not use drugs.The patient is accompanied by her husband today.  Allergies:  Allergies  Allergen Reactions   Oxycodone Other (See Comments)    oxycodone  aspirin  / oxycodone   Oxycodone-Aspirin  Nausea And Vomiting, Nausea Only and Other (See Comments)    aspirin  / oxycodone   Other Nausea And Vomiting, Other (See Comments) and Rash   Oxycodone-Acetaminophen  Other (See Comments)   Acetaminophen  Other (See Comments)   Aspirin  Other (See Comments)   Erythromycin Nausea Only and Other (See Comments)   Oxycodone Hcl Nausea And Vomiting   Tramadol  Itching   Amoxicillin Hives and Dermatitis    Has patient had a PCN reaction causing immediate rash, facial/tongue/throat swelling, SOB or lightheadedness with hypotension:unsure  Has patient had a PCN reaction causing severe rash involving mucus membranes or skin necrosis:No  Has patient had a PCN reaction that required hospitalization:No  Has patient had a PCN reaction occurring within the last 10 years: Yes  If all of the above answers are NO, then may proceed with Cephalosporin use.  Has patient had a PCN reaction causing immediate rash, facial/tongue/throat swelling, SOB or  lightheadedness with hypotension:unsure, Has patient had a PCN reaction causing severe rash involving mucus membranes or skin necrosis:No, Has patient had a PCN reaction that required hospitalization:No, Has patient had a PCN reaction occurring within the last 10 years: Yes, If all of the above answers are NO, then may proceed with Cephalosporin  use.    Current Medications: Current Outpatient Medications  Medication Sig Dispense Refill   rOPINIRole  (REQUIP ) 2 MG tablet Take 4 mg by mouth at bedtime.     abemaciclib  (VERZENIO ) 100 MG tablet Take 1 tablet (100 mg total) by mouth 2 (two) times daily. 56 tablet 5   acetaminophen  (TYLENOL ) 500 MG tablet Take 1,000 mg by mouth every 6 (six) hours as needed for mild pain.     acidophilus (RISAQUAD) CAPS capsule Take 1 capsule by mouth daily.     ascorbic acid (VITAMIN C) 1000 MG tablet Take 1,000 mg by mouth daily.     aspirin  325 MG tablet Take 325 mg by mouth daily.     azithromycin  (ZITHROMAX ) 500 MG tablet Take 1 tablet (500 mg total) by mouth daily. 10 tablet 0   beta carotene 15 MG capsule Take 15 mg by mouth daily.     Biotin 5000 MCG TABS Take 5,000 mcg by mouth daily.     CALCIUM  CITRATE-VITAMIN D PO Take 1 tablet by mouth in the morning, at noon, in the evening, and at bedtime.     clopidogrel  (PLAVIX ) 75 MG tablet Take 1 tablet (75 mg total) by mouth daily.     cyanocobalamin  (,VITAMIN B-12,) 1000 MCG/ML injection Inject 1,000 mcg into the muscle every 30 (thirty) days.     Denosumab  (XGEVA  [place]) Inject 1 Dose into the skin every 30 (thirty) days.     famotidine  (PEPCID ) 40 MG tablet Take 40 mg by mouth daily.     glucosamine-chondroitin 500-400 MG tablet Take 1 tablet by mouth daily.     guaiFENesin (MUCINEX) 600 MG 12 hr tablet Take 600 mg by mouth 2 (two) times daily.     ketotifen (ZADITOR) 0.025 % ophthalmic solution Place 1 drop into both eyes daily as needed (allergies).     levothyroxine  (SYNTHROID , LEVOTHROID) 88 MCG tablet Take  88 mcg by mouth daily before breakfast.     LORazepam  (ATIVAN ) 1 MG tablet Take 1 tablet (1 mg total) by mouth every 8 (eight) hours as needed for anxiety. 30 tablet 0   losartan  (COZAAR ) 25 MG tablet Take 25 mg by mouth daily.     Lutein 20 MG CAPS Take 20 mg by mouth daily.     meclizine  (ANTIVERT ) 25 MG tablet Take 25 mg by mouth 3 (three) times daily as needed for dizziness.     melatonin 5 MG TABS Take 5 mg by mouth at bedtime as needed (sleep).     metroNIDAZOLE  (METROCREAM ) 0.75 % cream Apply 1 Application topically daily.     Multiple Vitamins-Minerals (MULTIVITAMIN WITH MINERALS) tablet Take 1 tablet by mouth daily.     Omega-3 1000 MG CAPS Take 1,000 mg by mouth in the morning and at bedtime.     ondansetron  (ZOFRAN ) 4 MG tablet Take 1 tablet (4 mg total) by mouth every 4 (four) hours as needed for nausea or vomiting. 20 tablet 5   pantoprazole  (PROTONIX ) 40 MG tablet Take 40 mg by mouth daily.     Polyethyl Glycol-Propyl Glycol 0.4-0.3 % SOLN Place 1-2 drops into both eyes 2 (two) times daily.     rosuvastatin  (CRESTOR ) 20 MG tablet Take 1 tablet (20 mg total) by mouth daily.     triamcinolone  cream (KENALOG ) 0.1 % Apply topically 2 (two) times daily as needed.     vitamin E 180 MG (400 UNITS) capsule Take 400 Units by mouth daily.     zinc gluconate 50 MG  tablet Take 50 mg by mouth daily.     No current facility-administered medications for this visit.    I,Jasmine M Lassiter,acting as a scribe for [name], MD.,have documented all relevant documentation on the behalf of [name], MD,as directed by  [name], MD while in the presence of [name], MD.

## 2024-01-26 NOTE — Telephone Encounter (Signed)
 Pt is taking the Verzenio  100mg  po after breakfast and then again after dinner. Has missed 1 dose since restarting on 01/18/2024. Her biggest concern is the increased fatigue, which is normal with chemotherapy. Pt encouraged to increase protein in her diet, drink plenty of fluids and schedule things that have to be done in the mornings, when she has more endurance. She verbalized understanding.

## 2024-01-26 NOTE — Telephone Encounter (Signed)
 01/26/24 Spoke with Curtistine R.([facility] scheduling)to schedule Pet Scan in Dec.[facility] will call and schedule patient.

## 2024-01-27 LAB — CANCER ANTIGEN 27.29: CA 27.29: 87.9 U/mL — ABNORMAL HIGH (ref 0.0–38.6)

## 2024-01-28 DIAGNOSIS — M25811 Other specified joint disorders, right shoulder: Secondary | ICD-10-CM | POA: Diagnosis not present

## 2024-01-28 DIAGNOSIS — M25511 Pain in right shoulder: Secondary | ICD-10-CM | POA: Diagnosis not present

## 2024-02-01 ENCOUNTER — Other Ambulatory Visit: Payer: Self-pay

## 2024-02-03 ENCOUNTER — Other Ambulatory Visit: Payer: Self-pay

## 2024-02-04 ENCOUNTER — Other Ambulatory Visit: Payer: Self-pay | Admitting: Oncology

## 2024-02-04 ENCOUNTER — Telehealth: Payer: Self-pay

## 2024-02-04 DIAGNOSIS — J209 Acute bronchitis, unspecified: Secondary | ICD-10-CM

## 2024-02-04 MED ORDER — AZITHROMYCIN 500 MG PO TABS: 500.0000 mg | ORAL_TABLET | Freq: Every day | ORAL | 0 refills | Status: DC

## 2024-02-04 NOTE — Telephone Encounter (Signed)
 Pt has called to request another refill of the 10 day Zithromax . She states cough is better, but not gone. Still productive cough w/thick yellow phlegm. Afebrile. No chills. She also wants to know if she could take OTC Mucinex?

## 2024-02-05 ENCOUNTER — Other Ambulatory Visit (HOSPITAL_COMMUNITY): Payer: Self-pay

## 2024-02-05 ENCOUNTER — Other Ambulatory Visit: Payer: Self-pay

## 2024-02-05 ENCOUNTER — Encounter: Payer: Self-pay | Admitting: Oncology

## 2024-02-05 NOTE — Progress Notes (Signed)
 Specialty Pharmacy Refill Coordination Note  Jade Mooney is a 78 y.o. female contacted today regarding refills of specialty medication(s) Abemaciclib  (VERZENIO )   Patient requested Delivery   Delivery date: 02/23/24   Verified address: [address] [place] [address]   Medication will be filled on: 02/22/24

## 2024-02-07 ENCOUNTER — Encounter: Payer: Self-pay | Admitting: Oncology

## 2024-02-08 ENCOUNTER — Other Ambulatory Visit: Payer: Self-pay

## 2024-02-08 NOTE — Progress Notes (Signed)
 Clinical Intervention Note  Clinical Intervention Notes: Patient diagnosed with acute bronchitis and started on azithromycin . No DDIs identified with Verzenio , however patient held Verzenio  for 5 days while taking azithromycin .   Clinical Intervention Outcomes: Prevention of an adverse drug event   Advertising Account Planner

## 2024-02-11 ENCOUNTER — Telehealth: Payer: Self-pay

## 2024-02-11 NOTE — Telephone Encounter (Signed)
 Pt takes the Verzenio  100mg  po BID, once after breakfast, and second dose after dinner. Denies missed doses. Reminded pt to please call us  if she were to develop temp of 100.4 day or night. She verbalized understanding. We confirmed her next appt date & time. Pt appreciative of call to check on her.

## 2024-02-22 ENCOUNTER — Other Ambulatory Visit: Payer: Self-pay

## 2024-02-23 ENCOUNTER — Inpatient Hospital Stay: Attending: Oncology

## 2024-02-23 ENCOUNTER — Inpatient Hospital Stay

## 2024-02-23 ENCOUNTER — Other Ambulatory Visit: Payer: Self-pay | Admitting: Oncology

## 2024-02-23 ENCOUNTER — Inpatient Hospital Stay: Admitting: Oncology

## 2024-02-23 VITALS — BP 157/92 | HR 75 | Temp 98.0°F | Resp 18 | Ht 62.0 in | Wt 182.0 lb

## 2024-02-23 DIAGNOSIS — C50919 Malignant neoplasm of unspecified site of unspecified female breast: Secondary | ICD-10-CM | POA: Insufficient documentation

## 2024-02-23 DIAGNOSIS — Z170 Estrogen receptor positive status [ER+]: Secondary | ICD-10-CM | POA: Diagnosis not present

## 2024-02-23 DIAGNOSIS — C7951 Secondary malignant neoplasm of bone: Secondary | ICD-10-CM

## 2024-02-23 DIAGNOSIS — C787 Secondary malignant neoplasm of liver and intrahepatic bile duct: Secondary | ICD-10-CM | POA: Insufficient documentation

## 2024-02-23 DIAGNOSIS — Z1732 Human epidermal growth factor receptor 2 negative status: Secondary | ICD-10-CM | POA: Insufficient documentation

## 2024-02-23 DIAGNOSIS — Z5111 Encounter for antineoplastic chemotherapy: Secondary | ICD-10-CM | POA: Insufficient documentation

## 2024-02-23 DIAGNOSIS — C50912 Malignant neoplasm of unspecified site of left female breast: Secondary | ICD-10-CM

## 2024-02-23 DIAGNOSIS — Z1721 Progesterone receptor positive status: Secondary | ICD-10-CM | POA: Insufficient documentation

## 2024-02-23 DIAGNOSIS — K6289 Other specified diseases of anus and rectum: Secondary | ICD-10-CM

## 2024-02-23 LAB — CMP (CANCER CENTER ONLY)
ALT: 27 U/L (ref 0–44)
AST: 33 U/L (ref 15–41)
Albumin: 4.3 g/dL (ref 3.5–5.0)
Alkaline Phosphatase: 54 U/L (ref 38–126)
Anion gap: 12 (ref 5–15)
BUN: 13 mg/dL (ref 8–23)
CO2: 23 mmol/L (ref 22–32)
Calcium: 9.6 mg/dL (ref 8.9–10.3)
Chloride: 97 mmol/L — ABNORMAL LOW (ref 98–111)
Creatinine: 0.94 mg/dL (ref 0.44–1.00)
GFR, Estimated: >60 mL/min (ref >60)
Glucose, Bld: 122 mg/dL — ABNORMAL HIGH (ref 70–99)
Potassium: 4.2 mmol/L (ref 3.5–5.1)
Sodium: 131 mmol/L — ABNORMAL LOW (ref 135–145)
Total Bilirubin: 0.4 mg/dL (ref 0.0–1.2)
Total Protein: 6.5 g/dL (ref 6.5–8.1)

## 2024-02-23 LAB — CBC WITH DIFFERENTIAL (CANCER CENTER ONLY)
Abs Immature Granulocytes: 0.01 K/uL (ref 0.00–0.07)
Basophils Absolute: 0.1 K/uL (ref 0.0–0.1)
Basophils Relative: 3 %
Eosinophils Absolute: 0.1 K/uL (ref 0.0–0.5)
Eosinophils Relative: 4 %
HCT: 32 % — ABNORMAL LOW (ref 36.0–46.0)
Hemoglobin: 10.7 g/dL — ABNORMAL LOW (ref 12.0–15.0)
Immature Granulocytes: 0 %
Lymphocytes Relative: 36 %
Lymphs Abs: 1 K/uL (ref 0.7–4.0)
MCH: 30.1 pg (ref 26.0–34.0)
MCHC: 33.4 g/dL (ref 30.0–36.0)
MCV: 90.1 fL (ref 80.0–100.0)
Monocytes Absolute: 0.4 K/uL (ref 0.1–1.0)
Monocytes Relative: 13 %
Neutro Abs: 1.2 K/uL — ABNORMAL LOW (ref 1.7–7.7)
Neutrophils Relative %: 44 %
Platelet Count: 206 K/uL (ref 150–400)
RBC: 3.55 MIL/uL — ABNORMAL LOW (ref 3.87–5.11)
RDW: 16.9 % — ABNORMAL HIGH (ref 11.5–15.5)
WBC Count: 2.7 K/uL — ABNORMAL LOW (ref 4.0–10.5)
nRBC: 0 % (ref 0.0–0.2)

## 2024-02-23 MED ORDER — FULVESTRANT 250 MG/5ML IM SOSY
500.0000 mg | PREFILLED_SYRINGE | Freq: Once | INTRAMUSCULAR | Status: AC
  Administered 2024-02-23: 500 mg via INTRAMUSCULAR
  Filled 2024-02-23: qty 10

## 2024-02-23 MED ORDER — HYDROCORTISONE ACETATE 25 MG RE SUPP: 25.0000 mg | Freq: Two times a day (BID) | RECTAL | 5 refills | Status: AC | PRN

## 2024-02-23 MED ORDER — DENOSUMAB 120 MG/1.7ML ~~LOC~~ SOLN
120.0000 mg | Freq: Once | SUBCUTANEOUS | Status: AC
  Administered 2024-02-23: 120 mg via SUBCUTANEOUS
  Filled 2024-02-23: qty 1.7

## 2024-02-23 NOTE — Progress Notes (Signed)
 [facility]  [address] [place],  [place]  [address] [phone]  Clinic Day: 02/23/24  Referring physician: Rusty Lonni Mooney, *  ASSESSMENT & PLAN:  Assessment: Primary malignant neoplasm of breast with metastasis [name]) Patient has a complicated oncology history history of invasive ductal carcinoma of the right breast diagnosed in 1997.  She was treated with mastectomy and adjuvant chemotherapy.  She also participated in clinical trial using adjuvant high-dose chemotherapy followed by stem cell transplant.  She was treated with adjuvant tamoxifen for 5 years.  She developed biopsy-proven liver metastasis, estrogen and HER2 receptor positive, in November 2007.  She received trastuzumab for an unknown period of time.  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  She was started on zoledronic  acid in July and anastrozole  in August, 2015.    She developed a metastatic lesion of the right anterior chest in August, 2017 confirmed to be adenocarcinoma and treated with excision and radiation.  Anastrozole  was discontinued and she was switched to letrozole .  Palbociclib  was added in January, 2018 after PET imaging revealed 2 new foci at T8 and the manubrium.  The dose of palbociclib  had to be decreased over time.  Zoledronic  acid was discontinued in January, 2019.  Letrozole  was discontinued due to progression and was started on fulvestrant  in February 2019 due to progression. She received palliative radiation to the cervical spine in February, 2019. Palbociclib  was held during radiation and resumed at 75 mg daily once completed. Denosumab  was started in March, 2019.  She has remained on this regimen without evidence of progression.  A PET in January, 2025 was stable, with evidence of treated bone metastases. She had significant neutropenia with the Ibrance  even taking it every other day and so we could not increase the dose any  further. Now with her rising CA 27.29, we suspected her cancer may be becoming resistant and so a PET scan was done on 10/20/2023, which revealed diffuse sclerotic osseous metastatic disease without focal hypermetabolism and no findings for visceral metastatic disease in the chest, abdomen or pelvis. When the CA 27.29 increased from 69.9 to 87.2, we stopped the Ibrance  and changed to Verzenio  100 mg BID along with the fluvestrant injections. The tumor marker continued to increase to 101.8 but finally has been decreasing to 89.5 and 85.7 by October, 21st. The last one had increased slightly to 87.9 in early November.   Severe Osteoarthritis/Degenerative Disc Disease  She has already had knee replacement and right hip replacement. 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 She is now undergoing physical therapy with some improvement of her back pain. She is now also having right shoulder pain and PT is ordered.   Plan:  She informed me that she had 2 rounds of azithromycin  and is taking Mucinex D with partial improvement of her cough. I will prescribe Anusol -HC cream for rectal relief due to her diarrhea. She is scheduled for an upcoming PET scan on 03/14/2024. She has a low WBC of 2.7 with an ANC of 1200 down from 3.9 with an ANC of 2300, low hemoglobin of 10.7, and platelet count of 206,000. Her CMP is normal other than an low sodium of 131.  Patient continues Verzenio  100 mg BID with diarrhea and we may discuss decreasing her dosage if her blood counts continue to drop. Her CA 27.29 today is pending but did increase  from 85.7 to 87.9 as of 01/26/2024. She will receive her Xgeva  and faslodex  injections today. She will return in 2 weeks with CBC and CMP and I will see her back in 4 weeks with CBC, CMP, and CA 27.29. The patient understands the plans discussed today and is in agreement with them.  She knows to contact our office if she develops  concerns prior to her next appointment.  I provided 19 minutes of face-to-face time during this encounter and > 50% was spent counseling as documented under my assessment and plan.   Jade Mooney [facility] A DEPT OF [facility] [address] [place] [address] Dept: [phone] Dept Fax: [phone]   No orders of the defined types were placed in this encounter.   CHIEF COMPLAINT:  CC: Recurrent hormone receptor positive breast cancer   Current Treatment:  Palbociclib /fulvestrant /denosumab    HISTORY OF PRESENT ILLNESS:  Jade Mooney who we began seeing in January 2023 for transfer of care from Jade Mooney for the continued treatment and management of metastatic breast cancer. She was originally diagnosed with stage III hormone receptor positive right breast cancer in 1997.  She was treated with right mastectomy and adjuvant chemotherapy with Taxotere for 4 cycles.  She also participated in a [facility] protocol with high-dose chemotherapy, cyclophosphamide/carboplatin/BCNU, followed by stem cell rescue in September 1997. She completed 5 years of tamoxifen in November 2002.  Unfortunately, she developed metastatic liver lesions, biopsy proven metastatic breast cancer, estrogen receptor positive and HER2 receptor positive, in November 2007.  She was treated with trastuzumab for an unknown period of time.    In July 2015, she was found to have incidental findings of mixed lytic and sclerotic bony lesions on CT abdomen and pelvis. She underwent biopsy of the right iliac bone and surgical pathology confirmed invasive ductal carcinoma, grade 2, estrogen and progesterone receptor positive. Bone density from May 2015 was normal. She was started on zolendronic acid every 12 weeks in July 2015. She was also found to have three scalp lesions, biopsied in July 2015 confirming metastatic breast  cancer. HER2 negative. She was started on anastrozole  in August 2015.   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     Anastrozole  was discontinued and she was switched to letrozole  in April 2018.  Palbociclib  was added in January 2018 after PET imaging revealed 2 new foci at T8 and the manubrium.   Zoledronic  acid was discontinued in January 2019.  Letrozole  was discontinued in February 2019 due to progression. She was started on fulvestrant  in February 2019. She received palliative radiation to the cervical spine in February 2019. Palbociclib  was held during that time and resumed at 75 mg daily once completed. Denosumab  was started in March 2019.    She has continued on palbociclib  75 mg daily for 3 weeks on and 1 week off, along with fulvestrant  and denosumab  every 4 weeks.  Most recent imaging was a PET scan in December 2023 revealed significant interval change in the diffuse osteoblastic metastatic lesions in the axial and proximal appendicular skeleton without abnormal FDG avidity compatible with chronic treated disease. No evidence of hypermetabolic local recurrent breast cancer or viable distant metastatic disease.    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 Patient states that she feels ok but complains of a  cough, diarrhea, rectal soreness/irritation, right shoulder, and low back pain. She informed me that she had 2 rounds of azithromycin  and is taking Mucinex D with partial improvement of her cough. I will prescribe Anusol -HC cream for rectal relief due to her diarrhea. She is scheduled for an upcoming PET scan on 03/14/2024. She has a low WBC of 2.7 with an ANC of 1200 down from 3.9 with an ANC of 2300, low hemoglobin of 10.7, and platelet count of 206,000. Her CMP is normal other than an low sodium of 131.  Patient continues Verzenio  100 mg BID with diarrhea and we may discuss decreasing her dosage if her blood counts continue to drop. Her CA 27.29 today is pending but did increase from 85.7 to 87.9 as of 01/26/2024. She will receive her Xgeva  and faslodex  injections today. She will return in 2 weeks with CBC and CMP and I will see her back in 4 weeks with CBC, CMP, and CA 27.29.   She denies fever, chills, night sweats, or other signs of infection. She denies cardiorespiratory issues. Her appetite is fair and Her weight has decreased 2 pounds over last 4 weeks. This patient is accompanied in the office by her husband.  REVIEW OF SYSTEMS:  Review of Systems  Constitutional:  Positive for appetite change (fair) and fatigue. Negative for chills, diaphoresis, fever and unexpected weight change.  HENT:  Negative.  Negative for hearing loss, lump/mass, mouth sores, nosebleeds, sore throat, tinnitus, trouble swallowing and voice change.   Eyes: Negative.  Negative for eye problems and icterus.  Respiratory:  Positive for cough (improved). Negative for chest tightness, hemoptysis, shortness of breath and wheezing.   Cardiovascular:  Negative for chest pain, leg swelling and palpitations.  Gastrointestinal:  Positive for diarrhea (improved) and nausea (mild). Negative for abdominal distention, abdominal pain, blood in stool, constipation, rectal pain and vomiting.       Rectal soreness/irritation due to diarrhea   Endocrine: Negative.   Genitourinary: Negative.  Negative for bladder incontinence, difficulty urinating, dyspareunia, dysuria, frequency, hematuria, menstrual problem, nocturia, pelvic pain, vaginal bleeding and vaginal discharge.   Musculoskeletal:  Positive for arthralgias, back pain (chronic, lower) and myalgias. Negative for flank pain, gait problem, neck pain and neck stiffness.       Chronic occasional right shoulder pain Sacroiliac pain  Skin: Negative.  Negative for itching, rash and wound.  Neurological:  Negative for dizziness, extremity weakness, gait problem, headaches, light-headedness, numbness, seizures and speech difficulty.  Hematological: Negative.  Negative for adenopathy. Does not bruise/bleed easily.  Psychiatric/Behavioral: Negative.  Negative for confusion, decreased concentration, depression, sleep disturbance and suicidal ideas. The patient is not nervous/anxious.     VITALS:  Blood pressure (!) 157/92, pulse 75, temperature 98 F (36.7 C), temperature source Oral, resp. rate 18, height 5' 2 (1.575 m), weight 182 lb (82.6 kg), SpO2 98%.  Wt Readings from Last  3 Encounters:  02/23/24 182 lb (82.6 kg)  01/26/24 184 lb 12.8 oz (83.8 kg)  12/29/23 181 lb 6.4 oz (82.3 kg)    Body mass index is 33.29 kg/m.  Performance status (ECOG): 1 - Symptomatic but completely ambulatory  PHYSICAL EXAM:  Physical Exam Vitals and nursing note reviewed. Exam conducted with a chaperone present.  Constitutional:      General: She is not in acute distress.    Appearance: Normal appearance. She is normal weight. She is not ill-appearing, toxic-appearing or diaphoretic.  HENT:     Head: Normocephalic and atraumatic.     Right Ear: Tympanic membrane, ear canal and external ear normal. There is no impacted cerumen.     Left Ear: Tympanic membrane, ear canal and external ear normal. There is no impacted cerumen.     Nose: Nose normal. No congestion or rhinorrhea.     Mouth/Throat:      Mouth: Mucous membranes are moist.     Pharynx: Oropharynx is clear. No oropharyngeal exudate or posterior oropharyngeal erythema.  Eyes:     General: No scleral icterus.       Right eye: No discharge.        Left eye: No discharge.     Extraocular Movements: Extraocular movements intact.     Conjunctiva/sclera: Conjunctivae normal.     Pupils: Pupils are equal, round, and reactive to light.  Neck:     Vascular: No carotid bruit.  Cardiovascular:     Rate and Rhythm: Normal rate and regular rhythm.     Pulses: Normal pulses.     Heart sounds: Normal heart sounds. No murmur heard.    No friction rub. No gallop.  Pulmonary:     Effort: Pulmonary effort is normal. No respiratory distress.     Breath sounds: Normal breath sounds. No stridor. No wheezing, rhonchi or rales.  Chest:     Chest wall: No tenderness.     Comments: She has had radiation to the right mastectomy  Telangectasia at the right mastectomy but negative for recurrence Left breast is without masses Well healed scar in the upper inner quadrant of the left breast with a tiny nodule just medial to it (stable finding) Abdominal:     General: Bowel sounds are normal. There is no distension.     Palpations: Abdomen is soft. There is no hepatomegaly, splenomegaly or mass.     Tenderness: There is no abdominal tenderness. There is no right CVA tenderness, left CVA tenderness, guarding or rebound.     Hernia: No hernia is present.     Comments: Liver feels normal  Musculoskeletal:        General: No swelling, deformity or signs of injury. Normal range of motion.     Cervical back: Normal range of motion and neck supple. No rigidity or tenderness.     Right lower leg: No edema.     Left lower leg: No edema.     Comments: Area of hard induration of the skin which is circumferential at the level of the knee  Lymphadenopathy:     Cervical: No cervical adenopathy.     Right cervical: No superficial, deep or posterior  cervical adenopathy.    Left cervical: No superficial, deep or posterior cervical adenopathy.     Upper Body:     Right upper body: No supraclavicular, axillary or pectoral adenopathy.     Left upper body: No supraclavicular, axillary or pectoral adenopathy.  Skin:    General:  Skin is warm and dry.     Coloration: Skin is not jaundiced or pale.     Findings: Lesion present. No bruising, ecchymosis, erythema or rash.     Comments: 3 slightly raised erythematous lesions in the right lower neck all in a row, looks suspicious for an insect bite.   Neurological:     General: No focal deficit present.     Mental Status: She is alert and oriented to person, place, and time. Mental status is at baseline.     Cranial Nerves: No cranial nerve deficit.     Sensory: No sensory deficit.     Motor: No weakness.     Coordination: Coordination normal.     Gait: Gait normal.     Deep Tendon Reflexes: Reflexes normal.  Psychiatric:        Mood and Affect: Mood normal.        Behavior: Behavior normal.        Thought Content: Thought content normal.        Judgment: Judgment normal.    LABS:      Latest Ref Rng & Units 02/23/2024   10:08 AM 01/26/2024   10:29 AM 01/12/2024    9:59 AM  CBC  WBC 4.0 - 10.5 K/uL 2.7  3.9  4.1   Hemoglobin 12.0 - 15.0 g/dL 89.2  89.1  88.9   Hematocrit 36.0 - 46.0 % 32.0  33.1  33.1   Platelets 150 - 400 K/uL 206  203  202       Latest Ref Rng & Units 02/23/2024   10:08 AM 01/26/2024   10:29 AM 01/12/2024    9:59 AM  CMP  Glucose 70 - 99 mg/dL 877  76  846   BUN 8 - 23 mg/dL 13  18  14    Creatinine 0.44 - 1.00 mg/dL 9.05  8.81  9.01   Sodium 135 - 145 mmol/L 131  132  135   Potassium 3.5 - 5.1 mmol/L 4.2  4.2  3.9   Chloride 98 - 111 mmol/L 97  97  99   CO2 22 - 32 mmol/L 23  23  23    Calcium  8.9 - 10.3 mg/dL 9.6  9.3  9.5   Total Protein 6.5 - 8.1 g/dL 6.5  6.5  6.6   Total Bilirubin 0.0 - 1.2 mg/dL 0.4  0.4  0.4   Alkaline Phos 38 - 126 U/L 54  56  59    AST 15 - 41 U/L 33  23  24   ALT 0 - 44 U/L 27  16  19     Component Ref Range & Units (hover) 4 wk ago (01/26/24) 1 mo ago (01/12/24) 1 mo ago (12/29/23) 2 mo ago (12/18/23) 2 mo ago (12/08/23) 2 mo ago (12/01/23) 3 mo ago (11/03/23)  CA 27.29 87.9 High  85.7 High  CM 89.5 High  CM 101.8 High  CM 96.3 High  CM 87.2 High  CM 69.9 High    No results found for: CEA1, CEA / No results found for: CEA1, CEA No results found for: PSA1 No results found for: CAN199 No results found for: CAN125  No results found for: TOTALPROTELP, ALBUMINELP, A1GS, A2GS, BETS, BETA2SER, GAMS, MSPIKE, SPEI No results found for: TIBC, FERRITIN, IRONPCTSAT Lab Results  Component Value Date   LDH 138 01/17/2011   LDH 155 12/28/2009   LDH 155 12/29/2008   STUDIES:  EXAM: 10/20/2023 NUCLEAR MEDICINE PET SKULL BASE TO THIGH IMPRESSION:  1. Status post right mastectomy. No findings for recurrent tumor. 2. Diffuse sclerotic osseous metastatic disease without focal hypermetabolism to suggest active disease. 3. No findings for visceral metastatic disease in the chest, abdomen or pelvis. 4. Aortic atherosclerosis.  HISTORY:   Past Medical History:  Diagnosis Date   Anxiety    Cancer [facility])    Breast 1997 right tx with mastectomy and chemo, metastatic now   GERD (gastroesophageal reflux disease)    Hypercholesterolemia    Hypertension    Hypothyroidism    Osteoarthritis    oa   Personal history of chemotherapy    Personal history of radiation therapy    Secondary malignant neoplasm of bone and bone marrow (HCC)    TIA (transient ischemic attack) last 09/10/20   x 3 total, they seem to occur every 5 years    Past Surgical History:  Procedure Laterality Date   CATARACT EXTRACTION, BILATERAL     CERVICAL FUSION  2020   MASTECTOMY MODIFIED RADICAL Right 1997   porta cath insertion  1997   later removed   radiation tx  finished 02-25-16   x 20 tx   TONSILLECTOMY      TOTAL HIP ARTHROPLASTY Left 03/12/2016   Procedure: LEFT TOTAL HIP ARTHROPLASTY ANTERIOR APPROACH;  Surgeon: Dempsey Moan, Mooney;  Location: [facility];  Service: Orthopedics;  Laterality: Left;   TOTAL HIP ARTHROPLASTY Right 04/13/2023   Procedure: TOTAL HIP ARTHROPLASTY ANTERIOR APPROACH;  Surgeon: Moan Dempsey, Mooney;  Location: [facility];  Service: Orthopedics;  Laterality: Right;   TOTAL KNEE ARTHROPLASTY Left    TOTAL KNEE ARTHROPLASTY Right 10/30/2021   Procedure: TOTAL KNEE ARTHROPLASTY;  Surgeon: Gerome Charleston, Mooney;  Location: [facility];  Service: Orthopedics;  Laterality: Right;  adductor canal 120   TUBAL LIGATION      Family History  Problem Relation Age of Onset   Breast cancer Maternal Aunt     Social History:  reports that she has never smoked. She has never used smokeless tobacco. She reports current alcohol use of about 1.0 standard drink of alcohol per week. She reports that she does not use drugs.The patient is accompanied by her husband today.  Allergies:  Allergies  Allergen Reactions   Other Nausea And Vomiting, Other (See Comments) and Rash    aspirin  / oxycodone   Oxycodone Other (See Comments)    oxycodone  aspirin  / oxycodone   Oxycodone-Aspirin  Nausea And Vomiting, Nausea Only and Other (See Comments)    aspirin  / oxycodone   Oxycodone-Acetaminophen  Other (See Comments)   Erythromycin Nausea Only and Other (See Comments)   Oxycodone Hcl Nausea And Vomiting   Tramadol  Itching   Amoxicillin Hives and Dermatitis    Has patient had a PCN reaction causing immediate rash, facial/tongue/throat swelling, SOB or lightheadedness with hypotension:unsure  Has patient had a PCN reaction causing severe rash involving mucus membranes or skin necrosis:No  Has patient had a PCN reaction that required hospitalization:No  Has patient had a PCN reaction occurring within the last 10 years: Yes  If all of the above answers are NO, then may proceed with Cephalosporin use.  Has  patient had a PCN reaction causing immediate rash, facial/tongue/throat swelling, SOB or lightheadedness with hypotension:unsure, Has patient had a PCN reaction causing severe rash involving mucus membranes or skin necrosis:No, Has patient had a PCN reaction that required hospitalization:No, Has patient had a PCN reaction occurring within the last 10 years: Yes, If all of the above answers are NO, then may  proceed with Cephalosporin use.    Current Medications: Current Outpatient Medications  Medication Sig Dispense Refill   abemaciclib  (VERZENIO ) 100 MG tablet Take 1 tablet (100 mg total) by mouth 2 (two) times daily. 56 tablet 5   acetaminophen  (TYLENOL ) 500 MG tablet Take 1,000 mg by mouth every 6 (six) hours as needed for mild pain.     acidophilus (RISAQUAD) CAPS capsule Take 1 capsule by mouth daily.     ascorbic acid (VITAMIN C) 1000 MG tablet Take 1,000 mg by mouth daily.     aspirin  325 MG tablet Take 325 mg by mouth daily.     beta carotene 15 MG capsule Take 15 mg by mouth daily.     Biotin 5000 MCG TABS Take 5,000 mcg by mouth daily.     CALCIUM  CITRATE-VITAMIN D PO Take 1 tablet by mouth in the morning, at noon, in the evening, and at bedtime.     clopidogrel  (PLAVIX ) 75 MG tablet Take 1 tablet (75 mg total) by mouth daily.     cyanocobalamin  (,VITAMIN B-12,) 1000 MCG/ML injection Inject 1,000 mcg into the muscle every 30 (thirty) days.     Denosumab  (XGEVA  [place]) Inject 1 Dose into the skin every 30 (thirty) days.     famotidine  (PEPCID ) 40 MG tablet Take 40 mg by mouth daily.     glucosamine-chondroitin 500-400 MG tablet Take 1 tablet by mouth daily.     hydrocortisone  (ANUSOL -HC) 25 MG suppository Place 1 suppository (25 mg total) rectally 2 (two) times daily as needed for hemorrhoids or anal itching. 12 suppository 5   ketotifen (ZADITOR) 0.025 % ophthalmic solution Place 1 drop into both eyes daily as needed (allergies).     levothyroxine  (SYNTHROID , LEVOTHROID) 88 MCG tablet  Take 88 mcg by mouth daily before breakfast.     LORazepam  (ATIVAN ) 1 MG tablet Take 1 tablet (1 mg total) by mouth every 8 (eight) hours as needed for anxiety. 30 tablet 0   losartan  (COZAAR ) 25 MG tablet Take 25 mg by mouth daily.     Lutein 20 MG CAPS Take 20 mg by mouth daily.     meclizine  (ANTIVERT ) 25 MG tablet Take 25 mg by mouth 3 (three) times daily as needed for dizziness.     metroNIDAZOLE  (METROCREAM ) 0.75 % cream Apply 1 Application topically daily.     Multiple Vitamins-Minerals (MULTIVITAMIN WITH MINERALS) tablet Take 1 tablet by mouth daily.     Omega-3 1000 MG CAPS Take 1,000 mg by mouth in the morning and at bedtime.     ondansetron  (ZOFRAN ) 4 MG tablet Take 1 tablet (4 mg total) by mouth every 4 (four) hours as needed for nausea or vomiting. 20 tablet 5   pantoprazole  (PROTONIX ) 40 MG tablet Take 40 mg by mouth daily.     Polyethyl Glycol-Propyl Glycol 0.4-0.3 % SOLN Place 1-2 drops into both eyes 2 (two) times daily.     rOPINIRole  (REQUIP ) 2 MG tablet Take 4 mg by mouth at bedtime.     rosuvastatin  (CRESTOR ) 20 MG tablet Take 1 tablet (20 mg total) by mouth daily.     triamcinolone  cream (KENALOG ) 0.1 % Apply topically 2 (two) times daily as needed.     vitamin E 180 MG (400 UNITS) capsule Take 400 Units by mouth daily.     zinc gluconate 50 MG tablet Take 50 mg by mouth daily.     No current facility-administered medications for this visit.    I,Jasmine M Lassiter,acting as a  scribe for Jade Mooney.,have documented all relevant documentation on the behalf of Jade Mooney,as directed by  Jade Mooney while in the presence of Jade Mooney.

## 2024-02-23 NOTE — Patient Instructions (Signed)
 Fulvestrant Injection What is this medication? FULVESTRANT (ful VES trant) treats breast cancer. It works by blocking the hormone estrogen in breast tissue, which prevents breast cancer cells from spreading or growing. This medicine may be used for other purposes; ask your health care provider or pharmacist if you have questions. COMMON BRAND NAME(S): FASLODEX What should I tell my care team before I take this medication? They need to know if you have any of these conditions: Bleeding disorder Liver disease Low blood cell levels (white cells, red cells, and platelets) An unusual or allergic reaction to fulvestrant, other medications, foods, dyes, or preservatives Pregnant or trying to get pregnant Breastfeeding How should I use this medication? This medication is injected into a muscle. It is given by your care team in a hospital or clinic setting. Talk to your care team about the use of this medication in children. Special care may be needed. Overdosage: If you think you have taken too much of this medicine contact a poison control center or emergency room at once. NOTE: This medicine is only for you. Do not share this medicine with others. What if I miss a dose? Keep appointments for follow-up doses. It is important not to miss your dose. Call your care team if you are unable to keep an appointment. What may interact with this medication? Fluoroestradiol F18 This list may not describe all possible interactions. Give your health care provider a list of all the medicines, herbs, non-prescription drugs, or dietary supplements you use. Also tell them if you smoke, drink alcohol, or use illegal drugs. Some items may interact with your medicine. What should I watch for while using this medication? Your condition will be monitored carefully while you are receiving this medication. You may need blood work done while you are taking this medication. This medication is injected into a muscle. Talk  to your care team if you also take medications that prevent or treat blood clots, such as warfarin. Blood thinners may increase the risk of bleeding or bruising in the muscle where this medication is injected. The benefits of this medication may outweigh the risks. Your care team can help you find the option that works for you. They can also help limit the risk of bleeding. Talk to your care team if you may be pregnant. Serious birth defects can occur if you take this medication during pregnancy and for 1 year after the last dose. You will need a negative pregnancy test before starting this medication. Contraception is recommended while taking this medication and for 1 year after the last dose. Your care team can help you find the option that works for you. Do not breastfeed while taking this medication and for 1 year after the last dose. This medication may cause infertility. Talk to your care team if you are concerned about your fertility. What side effects may I notice from receiving this medication? Side effects that you should report to your care team as soon as possible: Allergic reactions or angioedema--skin rash, itching or hives, swelling of the face, eyes, lips, tongue, arms, or legs, trouble swallowing or breathing Pain, tingling, or numbness in the hands or feet Side effects that usually do not require medical attention (report to your care team if they continue or are bothersome): Bone, joint, or muscle pain Constipation Headache Hot flashes Nausea Pain, redness, or irritation at injection site Unusual weakness or fatigue This list may not describe all possible side effects. Call your doctor for medical advice about side  effects. You may report side effects to FDA at 1-800-FDA-1088. Where should I keep my medication? This medication is given in a hospital or clinic. It will not be stored at home. NOTE: This sheet is a summary. It may not cover all possible information. If you have  questions about this medicine, talk to your doctor, pharmacist, or health care provider.  2024 [employer] Standard (2022-11-14 00:00:00)Denosumab Injection (Oncology) What is this medication? DENOSUMAB (den oh SUE mab) prevents weakened bones caused by cancer. It may also be used to treat noncancerous bone tumors that cannot be removed by surgery. It can also be used to treat high calcium levels in the blood caused by cancer. It works by blocking a protein that causes bones to break down quickly. This slows down the release of calcium from bones, which lowers calcium levels in your blood. It also makes your bones stronger and less likely to break (fracture). This medicine may be used for other purposes; ask your health care provider or pharmacist if you have questions. COMMON BRAND NAME(S): XGEVA What should I tell my care team before I take this medication? They need to know if you have any of these conditions: Dental disease Having surgery or tooth extraction Infection Kidney disease Low levels of calcium or vitamin D in the blood Malnutrition On hemodialysis Skin conditions or sensitivity Thyroid or parathyroid disease An unusual reaction to denosumab, other medications, foods, dyes, or preservatives Pregnant or trying to get pregnant Breast-feeding How should I use this medication? This medication is for injection under the skin. It is given by your care team in a hospital or clinic setting. A special MedGuide will be given to you before each treatment. Be sure to read this information carefully each time. Talk to your care team about the use of this medication in children. While it may be prescribed for children as young as 13 years for selected conditions, precautions do apply. Overdosage: If you think you have taken too much of this medicine contact a poison control center or emergency room at once. NOTE: This medicine is only for you. Do not share this medicine with others. What  if I miss a dose? Keep appointments for follow-up doses. It is important not to miss your dose. Call your care team if you are unable to keep an appointment. What may interact with this medication? Do not take this medication with any of the following: Other medications containing denosumab This medication may also interact with the following: Medications that lower your chance of fighting infection Steroid medications, such as prednisone or cortisone This list may not describe all possible interactions. Give your health care provider a list of all the medicines, herbs, non-prescription drugs, or dietary supplements you use. Also tell them if you smoke, drink alcohol, or use illegal drugs. Some items may interact with your medicine. What should I watch for while using this medication? Your condition will be monitored carefully while you are receiving this medication. You may need blood work while taking this medication. This medication may increase your risk of getting an infection. Call your care team for advice if you get a fever, chills, sore throat, or other symptoms of a cold or flu. Do not treat yourself. Try to avoid being around people who are sick. You should make sure you get enough calcium and vitamin D while you are taking this medication, unless your care team tells you not to. Discuss the foods you eat and the vitamins you take with your  care team. Some people who take this medication have severe bone, joint, or muscle pain. This medication may also increase your risk for jaw problems or a broken thigh bone. Tell your care team right away if you have severe pain in your jaw, bones, joints, or muscles. Tell your care team if you have any pain that does not go away or that gets worse. Talk to your care team if you may be pregnant. Serious birth defects can occur if you take this medication during pregnancy and for 5 months after the last dose. You will need a negative pregnancy test before  starting this medication. Contraception is recommended while taking this medication and for 5 months after the last dose. Your care team can help you find the option that works for you. What side effects may I notice from receiving this medication? Side effects that you should report to your care team as soon as possible: Allergic reactions--skin rash, itching, hives, swelling of the face, lips, tongue, or throat Bone, joint, or muscle pain Low calcium level--muscle pain or cramps, confusion, tingling, or numbness in the hands or feet Osteonecrosis of the jaw--pain, swelling, or redness in the mouth, numbness of the jaw, poor healing after dental work, unusual discharge from the mouth, visible bones in the mouth Side effects that usually do not require medical attention (report to your care team if they continue or are bothersome): Cough Diarrhea Fatigue Headache Nausea This list may not describe all possible side effects. Call your doctor for medical advice about side effects. You may report side effects to FDA at 1-800-FDA-1088. Where should I keep my medication? This medication is given in a hospital or clinic. It will not be stored at home. NOTE: This sheet is a summary. It may not cover all possible information. If you have questions about this medicine, talk to your doctor, pharmacist, or health care provider.  2024 [employer] Standard (2021-07-31 00:00:00)

## 2024-02-24 DIAGNOSIS — E538 Deficiency of other specified B group vitamins: Secondary | ICD-10-CM | POA: Diagnosis not present

## 2024-02-24 LAB — CANCER ANTIGEN 27.29: CA 27.29: 76.9 U/mL — ABNORMAL HIGH (ref 0.0–38.6)

## 2024-02-27 ENCOUNTER — Encounter: Payer: Self-pay | Admitting: Oncology

## 2024-03-02 DIAGNOSIS — H353131 Nonexudative age-related macular degeneration, bilateral, early dry stage: Secondary | ICD-10-CM | POA: Diagnosis not present

## 2024-03-02 DIAGNOSIS — H40051 Ocular hypertension, right eye: Secondary | ICD-10-CM | POA: Diagnosis not present

## 2024-03-08 ENCOUNTER — Inpatient Hospital Stay

## 2024-03-08 DIAGNOSIS — C50919 Malignant neoplasm of unspecified site of unspecified female breast: Secondary | ICD-10-CM

## 2024-03-08 DIAGNOSIS — Z5111 Encounter for antineoplastic chemotherapy: Secondary | ICD-10-CM | POA: Diagnosis not present

## 2024-03-08 LAB — CBC WITH DIFFERENTIAL (CANCER CENTER ONLY)
Abs Immature Granulocytes: 0.01 K/uL (ref 0.00–0.07)
Basophils Absolute: 0.1 K/uL (ref 0.0–0.1)
Basophils Relative: 3 %
Eosinophils Absolute: 0.1 K/uL (ref 0.0–0.5)
Eosinophils Relative: 4 %
HCT: 32.6 % — ABNORMAL LOW (ref 36.0–46.0)
Hemoglobin: 11.1 g/dL — ABNORMAL LOW (ref 12.0–15.0)
Immature Granulocytes: 0 %
Lymphocytes Relative: 31 %
Lymphs Abs: 1.1 K/uL (ref 0.7–4.0)
MCH: 30.6 pg (ref 26.0–34.0)
MCHC: 34 g/dL (ref 30.0–36.0)
MCV: 89.8 fL (ref 80.0–100.0)
Monocytes Absolute: 0.5 K/uL (ref 0.1–1.0)
Monocytes Relative: 15 %
Neutro Abs: 1.7 K/uL (ref 1.7–7.7)
Neutrophils Relative %: 47 %
Platelet Count: 187 K/uL (ref 150–400)
RBC: 3.63 MIL/uL — ABNORMAL LOW (ref 3.87–5.11)
RDW: 17 % — ABNORMAL HIGH (ref 11.5–15.5)
WBC Count: 3.4 K/uL — ABNORMAL LOW (ref 4.0–10.5)
nRBC: 0 % (ref 0.0–0.2)

## 2024-03-08 LAB — CMP (CANCER CENTER ONLY)
ALT: 19 U/L (ref 0–44)
AST: 28 U/L (ref 15–41)
Albumin: 4.7 g/dL (ref 3.5–5.0)
Alkaline Phosphatase: 61 U/L (ref 38–126)
Anion gap: 11 (ref 5–15)
BUN: 16 mg/dL (ref 8–23)
CO2: 25 mmol/L (ref 22–32)
Calcium: 9.7 mg/dL (ref 8.9–10.3)
Chloride: 99 mmol/L (ref 98–111)
Creatinine: 0.98 mg/dL (ref 0.44–1.00)
GFR, Estimated: 59 mL/min — ABNORMAL LOW (ref >60)
Glucose, Bld: 88 mg/dL (ref 70–99)
Potassium: 4.3 mmol/L (ref 3.5–5.1)
Sodium: 135 mmol/L (ref 135–145)
Total Bilirubin: 0.4 mg/dL (ref 0.0–1.2)
Total Protein: 6.6 g/dL (ref 6.5–8.1)

## 2024-03-10 ENCOUNTER — Other Ambulatory Visit: Payer: Self-pay

## 2024-03-11 ENCOUNTER — Other Ambulatory Visit: Payer: Self-pay

## 2024-03-11 NOTE — Progress Notes (Signed)
 Specialty Pharmacy Ongoing Clinical Assessment Note  Jade Mooney is a 78 y.o. female who is being followed by the specialty pharmacy service for RxSp Oncology   Patient's specialty medication(s) reviewed today: Abemaciclib  (VERZENIO )   Missed doses in the last 4 weeks: 0   Patient/Caregiver did not have any additional questions or concerns.   Therapeutic benefit summary: Patient is achieving benefit   Adverse events/side effects summary: No adverse events/side effects   Patient's therapy is appropriate to: Continue    Goals Addressed             This Visit's Progress    Maintain optimal adherence to therapy   On track    Patient is on track. Patient will maintain adherence         Follow up: 3 months  [facility] Specialty Pharmacist

## 2024-03-14 ENCOUNTER — Encounter (HOSPITAL_COMMUNITY)
Admission: RE | Admit: 2024-03-14 | Discharge: 2024-03-14 | Disposition: A | Discharge status: Home / Self Care | Source: Ambulatory Visit | Attending: Oncology | Admitting: Oncology

## 2024-03-14 DIAGNOSIS — C50919 Malignant neoplasm of unspecified site of unspecified female breast: Secondary | ICD-10-CM | POA: Diagnosis present

## 2024-03-14 DIAGNOSIS — C7951 Secondary malignant neoplasm of bone: Secondary | ICD-10-CM | POA: Insufficient documentation

## 2024-03-14 LAB — GLUCOSE, CAPILLARY: Glucose-Capillary: 117 mg/dL — ABNORMAL HIGH (ref 70–99)

## 2024-03-14 MED ORDER — FLUDEOXYGLUCOSE F - 18 (FDG) INJECTION
8.5000 | Freq: Once | INTRAVENOUS | Status: AC
  Administered 2024-03-14: 9.04 via INTRAVENOUS

## 2024-03-15 ENCOUNTER — Other Ambulatory Visit (HOSPITAL_COMMUNITY): Payer: Self-pay

## 2024-03-16 ENCOUNTER — Other Ambulatory Visit: Payer: Self-pay

## 2024-03-18 ENCOUNTER — Other Ambulatory Visit: Payer: Self-pay | Admitting: Pharmacy Technician

## 2024-03-18 ENCOUNTER — Other Ambulatory Visit: Payer: Self-pay

## 2024-03-18 NOTE — Progress Notes (Signed)
 Specialty Pharmacy Refill Coordination Note  Jade Mooney is a 78 y.o. female contacted today regarding refills of specialty medication(s) Abemaciclib  (VERZENIO )   Patient requested Delivery   Delivery date: 03/21/24   Verified address: [address]  [place]   Medication will be filled on: 03/18/24

## 2024-03-22 ENCOUNTER — Other Ambulatory Visit: Payer: Self-pay

## 2024-03-22 ENCOUNTER — Other Ambulatory Visit: Payer: Self-pay | Admitting: Oncology

## 2024-03-22 ENCOUNTER — Inpatient Hospital Stay

## 2024-03-22 ENCOUNTER — Encounter: Payer: Self-pay | Admitting: Oncology

## 2024-03-22 ENCOUNTER — Inpatient Hospital Stay: Admitting: Oncology

## 2024-03-22 VITALS — BP 148/78 | HR 74 | Temp 97.5°F | Resp 18 | Ht 62.0 in | Wt 181.4 lb

## 2024-03-22 DIAGNOSIS — C50919 Malignant neoplasm of unspecified site of unspecified female breast: Secondary | ICD-10-CM

## 2024-03-22 DIAGNOSIS — C50912 Malignant neoplasm of unspecified site of left female breast: Secondary | ICD-10-CM

## 2024-03-22 DIAGNOSIS — C7951 Secondary malignant neoplasm of bone: Secondary | ICD-10-CM | POA: Diagnosis not present

## 2024-03-22 DIAGNOSIS — Z5111 Encounter for antineoplastic chemotherapy: Secondary | ICD-10-CM | POA: Diagnosis not present

## 2024-03-22 DIAGNOSIS — J209 Acute bronchitis, unspecified: Secondary | ICD-10-CM

## 2024-03-22 LAB — CBC WITH DIFFERENTIAL (CANCER CENTER ONLY)
Abs Immature Granulocytes: 0.02 K/uL (ref 0.00–0.07)
Basophils Absolute: 0.1 K/uL (ref 0.0–0.1)
Basophils Relative: 2 %
Eosinophils Absolute: 0.2 K/uL (ref 0.0–0.5)
Eosinophils Relative: 5 %
HCT: 33.3 % — ABNORMAL LOW (ref 36.0–46.0)
Hemoglobin: 11.2 g/dL — ABNORMAL LOW (ref 12.0–15.0)
Immature Granulocytes: 1 %
Lymphocytes Relative: 30 %
Lymphs Abs: 1 K/uL (ref 0.7–4.0)
MCH: 30.3 pg (ref 26.0–34.0)
MCHC: 33.6 g/dL (ref 30.0–36.0)
MCV: 90 fL (ref 80.0–100.0)
Monocytes Absolute: 0.4 K/uL (ref 0.1–1.0)
Monocytes Relative: 12 %
Neutro Abs: 1.6 K/uL — ABNORMAL LOW (ref 1.7–7.7)
Neutrophils Relative %: 50 %
Platelet Count: 229 K/uL (ref 150–400)
RBC: 3.7 MIL/uL — ABNORMAL LOW (ref 3.87–5.11)
RDW: 16.7 % — ABNORMAL HIGH (ref 11.5–15.5)
WBC Count: 3.2 K/uL — ABNORMAL LOW (ref 4.0–10.5)
nRBC: 0 % (ref 0.0–0.2)

## 2024-03-22 LAB — CMP (CANCER CENTER ONLY)
ALT: 27 U/L (ref 0–44)
AST: 31 U/L (ref 15–41)
Albumin: 4.7 g/dL (ref 3.5–5.0)
Alkaline Phosphatase: 68 U/L (ref 38–126)
Anion gap: 13 (ref 5–15)
BUN: 19 mg/dL (ref 8–23)
CO2: 24 mmol/L (ref 22–32)
Calcium: 10 mg/dL (ref 8.9–10.3)
Chloride: 97 mmol/L — ABNORMAL LOW (ref 98–111)
Creatinine: 1.08 mg/dL — ABNORMAL HIGH (ref 0.44–1.00)
GFR, Estimated: 52 mL/min — ABNORMAL LOW
Glucose, Bld: 98 mg/dL (ref 70–99)
Potassium: 4 mmol/L (ref 3.5–5.1)
Sodium: 133 mmol/L — ABNORMAL LOW (ref 135–145)
Total Bilirubin: 0.4 mg/dL (ref 0.0–1.2)
Total Protein: 6.8 g/dL (ref 6.5–8.1)

## 2024-03-22 MED ORDER — LEVOFLOXACIN 750 MG PO TABS: 750.0000 mg | ORAL_TABLET | Freq: Every day | ORAL | 0 refills | Status: AC

## 2024-03-22 MED ORDER — DENOSUMAB 120 MG/1.7ML ~~LOC~~ SOLN
120.0000 mg | Freq: Once | SUBCUTANEOUS | Status: AC
  Administered 2024-03-22: 120 mg via SUBCUTANEOUS
  Filled 2024-03-22: qty 1.7

## 2024-03-22 MED ORDER — FULVESTRANT 250 MG/5ML IM SOSY
500.0000 mg | PREFILLED_SYRINGE | Freq: Once | INTRAMUSCULAR | Status: AC
  Administered 2024-03-22: 500 mg via INTRAMUSCULAR

## 2024-03-22 NOTE — Progress Notes (Signed)
 " [facility]  [address] [place],  [place]  [address] [phone]  Clinic Day: 03/22/24  Referring physician: Rusty, Lonni HERO, MD  ASSESSMENT & PLAN:  Assessment: Primary malignant neoplasm of breast with metastasis [facility]) Patient has a complicated oncology history history of invasive ductal carcinoma of the right breast diagnosed in 1997.  She was treated with mastectomy and adjuvant chemotherapy.  She also participated in clinical trial using adjuvant high-dose chemotherapy followed by stem cell transplant.  She was treated with adjuvant tamoxifen for 5 years.  She developed biopsy-proven liver metastasis, estrogen and HER2 receptor positive, in November 2007.  She received trastuzumab for an unknown period of time.  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  She was started on zoledronic  acid in July and anastrozole  in August, 2015.    She developed a metastatic lesion of the right anterior chest in August, 2017 confirmed to be adenocarcinoma and treated with excision and radiation.  Anastrozole  was discontinued and she was switched to letrozole .  Palbociclib  was added in January, 2018 after PET imaging revealed 2 new foci at T8 and the manubrium.  The dose of palbociclib  had to be decreased over time.  Zoledronic  acid was discontinued in January, 2019.  Letrozole  was discontinued due to progression and was started on fulvestrant  in February 2019 due to progression. She received palliative radiation to the cervical spine in February, 2019. Palbociclib  was held during radiation and resumed at 75 mg daily once completed. Denosumab  was started in March, 2019.  She has remained on this regimen without evidence of progression.  A PET in January, 2025 was stable, with evidence of treated bone metastases. She had significant neutropenia with the Ibrance  even taking it every other day and so we could not increase the dose any  further. Now with her rising CA 27.29, we suspected her cancer may be becoming resistant and so a PET scan was done on 10/20/2023, which revealed diffuse sclerotic osseous metastatic disease without focal hypermetabolism and no findings for visceral metastatic disease in the chest, abdomen or pelvis.   When the CA 27.29 increased from 69.9 to 87.2, we stopped the Ibrance  since we were unable to give her adequate doses due to her cytopenias. We changed to Verzenio  100 mg BID along with the fluvestrant injections. The tumor marker continued to increase to 101.8 but finally has been decreasing to 89.5 and 85.7 by October, 21st. The last one had decreased to 76.9 in December, 2025.   Severe Osteoarthritis/Degenerative Disc Disease  She has already had knee replacement and right hip replacement. 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   Plan:  She was previously placed on Zithromax  for about 3 weeks in November for her persistent cough with mild alleviation but no resolution. I will prescribe Levaquin  750 mg for 14 days for her persistent cough. She informed me that she had another TIA on 12/20 which lasted for a couple of hours. She states that this caused temporary  confusion and aphasia of words. This lasted longer than her previous episodes have. She is on Plavix  and aspirin  80 mg daily. She did not go to the hospital at that time. I encouraged her that if this happens again to go to the ED. She continues Verzenio  100 mg BID without difficulty and states that her diarrhea has resolved. She had a PET scan done  on 03/14/2024 and results are pending. I reviewed the images in detail with her and her husband, and it looks good. She has a WBC of 3.2 with an ANC of 1600, low hemoglobin of 11.2, and platelet count of 229,000. Her CMP is fairly normal other than an low sodium of 133 and elevated creatinine of 1.08. Her CA 27.29 is pending. However her  last reading had decreased from  87.9 to 76.9 by 02/23/2024. She will receive her Xgeva  and faslodex  injections today. I will see her back in 4 weeks with CBC, CMP, and CA 27.29. The patient understands the plans discussed today and is in agreement with them.  She knows to contact our office if she develops concerns prior to her next appointment.  I provided 29 minutes of face-to-face time during this encounter and > 50% was spent counseling as documented under my assessment and plan.   [name], MD [facility] - A DEPT OF [facility] [address] [place] [address] Dept: [phone] Dept Fax: [phone]   No orders of the defined types were placed in this encounter.   CHIEF COMPLAINT:  CC: Recurrent hormone receptor positive breast cancer   Current Treatment:  Palbociclib /fulvestrant /denosumab    HISTORY OF PRESENT ILLNESS:  Jade Mooney is a 78 y.o. female who we began seeing in January 2023 for transfer of care from Dr. Sandria Gell for the continued treatment and management of metastatic breast cancer. She was originally diagnosed with stage III hormone receptor positive right breast cancer in 1997.  She was treated with right mastectomy and adjuvant chemotherapy with Taxotere for 4 cycles.  She also participated in a [facility] protocol with high-dose chemotherapy, cyclophosphamide/carboplatin/BCNU, followed by stem cell rescue in September 1997. She completed 5 years of tamoxifen in November 2002.  Unfortunately, she developed metastatic liver lesions, biopsy proven metastatic breast cancer, estrogen receptor positive and HER2 receptor positive, in November 2007.  She was treated with trastuzumab for an unknown period of time.    In July 2015, she was found to have incidental findings of mixed lytic and sclerotic bony lesions on CT abdomen and pelvis. She underwent biopsy of the right iliac bone and surgical  pathology confirmed invasive ductal carcinoma, grade 2, estrogen and progesterone receptor positive. Bone density from May 2015 was normal. She was started on zolendronic acid every 12 weeks in July 2015. She was also found to have three scalp lesions, biopsied in July 2015 confirming metastatic breast cancer. HER2 negative. She was started on anastrozole  in August 2015.   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     Anastrozole  was discontinued and she was switched to letrozole  in April 2018.  Palbociclib  was added in January 2018 after PET imaging revealed 2 new foci at T8 and the manubrium.   Zoledronic  acid was discontinued in January 2019.  Letrozole  was discontinued in February 2019 due to progression. She was started on fulvestrant  in February 2019. She received palliative radiation to the cervical spine in February 2019. Palbociclib  was held during that time and resumed at 75 mg daily once completed. Denosumab  was started in March 2019.    She has continued on palbociclib  75 mg daily for 3 weeks on and 1 week off, along with fulvestrant  and denosumab  every 4 weeks.  Most recent imaging was a PET scan in December 2023  revealed significant interval change in the diffuse osteoblastic metastatic lesions in the axial and proximal appendicular skeleton without abnormal FDG avidity compatible with chronic treated disease. No evidence of hypermetabolic local recurrent breast cancer or viable distant metastatic disease.    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 Patient states that she feels well but complains of an persistent cough with yellow phlegm and chronic back pain rating a 3/10. She was previously placed on Zithromax  for about 3 weeks in November with mild alleviation but no resolution. I will prescribe Levaquin  750 mg for 14 days for her persistent cough. She informed me that she had another TIA on 12/20 which lasted for a couple of hours. She stats that this caused temporary  confusion and aphasia of words. This lasted longer than her previous episodes have. She is on Plavix  and aspirin  81 mg daily. She did not go to the hospital at that time. I encouraged her that if this happens again to go to the ED. She continues Verzenio  100 mg BID without difficulty and states that her diarrhea has resolved. She had a PET scan done on 03/14/2024 and results are pending. I reviewed the images in detail with her and her husband. She has a WBC of 3.2 with an ANC of 1600, low hemoglobin of 11.2, and platelet count of 229,000. Her CMP is fairly normal other than an low sodium of 133 and elevated creatinine of 1.08. Her CA 27.29 is pending. However her last reading had decreased from 87.9 to 76.9 by 02/23/24. She will receive her Xgeva  and faslodex  injections today. I will see her back in 4 weeks with CBC, CMP, and CA 27.29. She denies fever, chills, night sweats, or other signs of infection. She denies cardiorespiratory and gastrointestinal issues. Her appetite is very good and Her weight has decreased 1 pounds over last 4 weeks.This patient is accompanied in the office by her husband.  REVIEW OF SYSTEMS:  Review of Systems  Constitutional:  Positive for fatigue. Negative for appetite change, chills, diaphoresis, fever and  unexpected weight change.  HENT:  Negative.  Negative for hearing loss, lump/mass, mouth sores, nosebleeds, sore throat, tinnitus, trouble swallowing and voice change.   Eyes: Negative.  Negative for eye problems and icterus.  Respiratory:  Positive for cough (yellow phlegm). Negative for chest tightness, hemoptysis, shortness of breath and wheezing.   Cardiovascular:  Negative for chest pain, leg swelling and palpitations.  Gastrointestinal:  Positive for nausea (mild). Negative for abdominal distention, abdominal pain, blood in stool, constipation, diarrhea (improved), rectal pain and vomiting.  Endocrine: Negative.   Genitourinary: Negative.  Negative for bladder incontinence, difficulty urinating, dyspareunia, dysuria, frequency, hematuria, menstrual problem, nocturia, pelvic pain, vaginal bleeding and vaginal discharge.   Musculoskeletal:  Positive for arthralgias, back pain (chronic, lower, 3/10) and myalgias. Negative for flank pain, gait problem, neck pain and neck stiffness.  Chronic occasional right shoulder pain Sacroiliac pain  Skin: Negative.  Negative for itching, rash and wound.  Neurological:  Negative for dizziness, extremity weakness, gait problem, headaches, light-headedness, numbness, seizures and speech difficulty.  Hematological: Negative.  Negative for adenopathy. Does not bruise/bleed easily.  Psychiatric/Behavioral: Negative.  Negative for confusion, decreased concentration, depression, sleep disturbance and suicidal ideas. The patient is not nervous/anxious.     VITALS:  Blood pressure (!) 148/78, pulse 74, temperature (!) 97.5 F (36.4 C), temperature source Oral, resp. rate 18, height 5' 2 (1.575 m), weight 181 lb 6.4 oz (82.3 kg), SpO2 98%.  Wt Readings from Last 3 Encounters:  03/22/24 181 lb 6.4 oz (82.3 kg)  02/23/24 182 lb (82.6 kg)  01/26/24 184 lb 12.8 oz (83.8 kg)    Body mass index is 33.18 kg/m.  Performance status (ECOG): 1 - Symptomatic but  completely ambulatory  PHYSICAL EXAM:  Physical Exam Vitals and nursing note reviewed. Exam conducted with a chaperone present.  Constitutional:      General: She is not in acute distress.    Appearance: Normal appearance. She is normal weight. She is not ill-appearing, toxic-appearing or diaphoretic.  HENT:     Head: Normocephalic and atraumatic.     Right Ear: Tympanic membrane, ear canal and external ear normal. There is no impacted cerumen.     Left Ear: Tympanic membrane, ear canal and external ear normal. There is no impacted cerumen.     Nose: Nose normal. No congestion or rhinorrhea.     Mouth/Throat:     Mouth: Mucous membranes are moist.     Pharynx: Oropharynx is clear. No oropharyngeal exudate or posterior oropharyngeal erythema.  Eyes:     General: No scleral icterus.       Right eye: No discharge.        Left eye: No discharge.     Extraocular Movements: Extraocular movements intact.     Conjunctiva/sclera: Conjunctivae normal.     Pupils: Pupils are equal, round, and reactive to light.  Neck:     Vascular: No carotid bruit.  Cardiovascular:     Rate and Rhythm: Normal rate and regular rhythm.     Pulses: Normal pulses.     Heart sounds: Normal heart sounds. No murmur heard.    No friction rub. No gallop.  Pulmonary:     Effort: Pulmonary effort is normal. No respiratory distress.     Breath sounds: Normal breath sounds. No stridor. No wheezing, rhonchi or rales.  Chest:     Chest wall: No tenderness.     Comments: Left breast is without masses  Right mastectomy site is negative but does have telangectasia  due to prior radiation  Abdominal:     General: Bowel sounds are normal. There is no distension.     Palpations: Abdomen is soft. There is no hepatomegaly, splenomegaly or mass.     Tenderness: There is no abdominal tenderness. There is no right CVA tenderness, left CVA tenderness, guarding or rebound.     Hernia: No hernia is present.     Comments: Liver  feels normal  Musculoskeletal:        General: No swelling, deformity or signs of injury. Normal range of motion.     Cervical back: Normal range of motion and neck supple. No rigidity or tenderness.     Right lower leg: No edema.     Left lower leg: No edema.     Comments: Area of hard induration of  the skin which is circumferential at the level of the knee  Lymphadenopathy:     Cervical: No cervical adenopathy.     Right cervical: No superficial, deep or posterior cervical adenopathy.    Left cervical: No superficial, deep or posterior cervical adenopathy.     Upper Body:     Right upper body: No supraclavicular, axillary or pectoral adenopathy.     Left upper body: No supraclavicular, axillary or pectoral adenopathy.  Skin:    General: Skin is warm and dry.     Coloration: Skin is not jaundiced or pale.     Findings: No bruising, ecchymosis, erythema, lesion or rash.  Neurological:     General: No focal deficit present.     Mental Status: She is alert and oriented to person, place, and time. Mental status is at baseline.     Cranial Nerves: No cranial nerve deficit.     Sensory: No sensory deficit.     Motor: No weakness.     Coordination: Coordination normal.     Gait: Gait normal.     Deep Tendon Reflexes: Reflexes normal.  Psychiatric:        Mood and Affect: Mood normal.        Behavior: Behavior normal.        Thought Content: Thought content normal.        Judgment: Judgment normal.    LABS:      Latest Ref Rng & Units 03/22/2024   10:36 AM 03/08/2024   10:38 AM 02/23/2024   10:08 AM  CBC  WBC 4.0 - 10.5 K/uL 3.2  3.4  2.7   Hemoglobin 12.0 - 15.0 g/dL 88.7  88.8  89.2   Hematocrit 36.0 - 46.0 % 33.3  32.6  32.0   Platelets 150 - 400 K/uL 229  187  206       Latest Ref Rng & Units 03/22/2024   10:36 AM 03/08/2024   10:38 AM 02/23/2024   10:08 AM  CMP  Glucose 70 - 99 mg/dL 98  88  877   BUN 8 - 23 mg/dL 19  16  13    Creatinine 0.44 - 1.00 mg/dL 8.91  9.01   9.05   Sodium 135 - 145 mmol/L 133  135  131   Potassium 3.5 - 5.1 mmol/L 4.0  4.3  4.2   Chloride 98 - 111 mmol/L 97  99  97   CO2 22 - 32 mmol/L 24  25  23    Calcium  8.9 - 10.3 mg/dL 89.9  9.7  9.6   Total Protein 6.5 - 8.1 g/dL 6.8  6.6  6.5   Total Bilirubin 0.0 - 1.2 mg/dL 0.4  0.4  0.4   Alkaline Phos 38 - 126 U/L 68  61  54   AST 15 - 41 U/L 31  28  33   ALT 0 - 44 U/L 27  19  27     Component Ref Range & Units (hover) 4 wk ago (01/26/24) 1 mo ago (01/12/24) 1 mo ago (12/29/23) 2 mo ago (12/18/23) 2 mo ago (12/08/23) 2 mo ago (12/01/23) 3 mo ago (11/03/23)  CA 27.29 87.9 High  85.7 High  CM 89.5 High  CM 101.8 High  CM 96.3 High  CM 87.2 High  CM 69.9 High    No results found for: CEA1, CEA / No results found for: CEA1, CEA No results found for: PSA1 No results found for: CAN199 No results found for: CAN125  No results found for: TOTALPROTELP, ALBUMINELP, A1GS, A2GS, BETS, BETA2SER, GAMS, MSPIKE, SPEI No results found for: TIBC, FERRITIN, IRONPCTSAT Lab Results  Component Value Date   LDH 138 01/17/2011   LDH 155 12/28/2009   LDH 155 12/29/2008   STUDIES:  EXAM: 10/20/2023 NUCLEAR MEDICINE PET SKULL BASE TO THIGH IMPRESSION: 1. Status post right mastectomy. No findings for recurrent tumor. 2. Diffuse sclerotic osseous metastatic disease without focal hypermetabolism to suggest active disease. 3. No findings for visceral metastatic disease in the chest, abdomen or pelvis. 4. Aortic atherosclerosis.  HISTORY:   Past Medical History:  Diagnosis Date   Anxiety    Cancer [facility])    Breast 1997 right tx with mastectomy and chemo, metastatic now   GERD (gastroesophageal reflux disease)    Hypercholesterolemia    Hypertension    Hypothyroidism    Osteoarthritis    oa   Personal history of chemotherapy    Personal history of radiation therapy    Secondary malignant neoplasm of bone and bone marrow (HCC)    TIA (transient  ischemic attack) last 09/10/20   x 3 total, they seem to occur every 5 years    Past Surgical History:  Procedure Laterality Date   CATARACT EXTRACTION, BILATERAL     CERVICAL FUSION  2020   MASTECTOMY MODIFIED RADICAL Right 1997   porta cath insertion  1997   later removed   radiation tx  finished 02-25-16   x 20 tx   TONSILLECTOMY     TOTAL HIP ARTHROPLASTY Left 03/12/2016   Procedure: LEFT TOTAL HIP ARTHROPLASTY ANTERIOR APPROACH;  Surgeon: Dempsey Moan, MD;  Location: [facility];  Service: Orthopedics;  Laterality: Left;   TOTAL HIP ARTHROPLASTY Right 04/13/2023   Procedure: TOTAL HIP ARTHROPLASTY ANTERIOR APPROACH;  Surgeon: Moan Dempsey, MD;  Location: [facility];  Service: Orthopedics;  Laterality: Right;   TOTAL KNEE ARTHROPLASTY Left    TOTAL KNEE ARTHROPLASTY Right 10/30/2021   Procedure: TOTAL KNEE ARTHROPLASTY;  Surgeon: Gerome Charleston, MD;  Location: [facility];  Service: Orthopedics;  Laterality: Right;  adductor canal 120   TUBAL LIGATION      Family History  Problem Relation Age of Onset   Breast cancer Maternal Aunt     Social History:  reports that she has never smoked. She has never used smokeless tobacco. She reports current alcohol use of about 1.0 standard drink of alcohol per week. She reports that she does not use drugs.The patient is accompanied by her husband today.  Allergies:  Allergies  Allergen Reactions   Other Nausea And Vomiting, Other (See Comments) and Rash    aspirin  / oxycodone   Oxycodone Other (See Comments)    oxycodone  aspirin  / oxycodone   Oxycodone-Aspirin  Nausea And Vomiting, Nausea Only and Other (See Comments)    aspirin  / oxycodone   Oxycodone-Acetaminophen  Other (See Comments)   Erythromycin Nausea Only and Other (See Comments)   Oxycodone Hcl Nausea And Vomiting   Tramadol  Itching   Amoxicillin Hives and Dermatitis    Has patient had a PCN reaction causing immediate rash, facial/tongue/throat swelling, SOB or lightheadedness with  hypotension:unsure  Has patient had a PCN reaction causing severe rash involving mucus membranes or skin necrosis:No  Has patient had a PCN reaction that required hospitalization:No  Has patient had a PCN reaction occurring within the last 10 years: Yes  If all of the above answers are NO, then may proceed with Cephalosporin use.  Has patient had a PCN reaction causing immediate  rash, facial/tongue/throat swelling, SOB or lightheadedness with hypotension:unsure, Has patient had a PCN reaction causing severe rash involving mucus membranes or skin necrosis:No, Has patient had a PCN reaction that required hospitalization:No, Has patient had a PCN reaction occurring within the last 10 years: Yes, If all of the above answers are NO, then may proceed with Cephalosporin use.    Current Medications: Current Outpatient Medications  Medication Sig Dispense Refill   abemaciclib  (VERZENIO ) 100 MG tablet Take 1 tablet (100 mg total) by mouth 2 (two) times daily. 56 tablet 5   acetaminophen  (TYLENOL ) 500 MG tablet Take 1,000 mg by mouth every 6 (six) hours as needed for mild pain.     acidophilus (RISAQUAD) CAPS capsule Take 1 capsule by mouth daily.     ascorbic acid (VITAMIN C) 1000 MG tablet Take 1,000 mg by mouth daily.     aspirin  325 MG tablet Take 325 mg by mouth daily.     beta carotene 15 MG capsule Take 15 mg by mouth daily.     Biotin 5000 MCG TABS Take 5,000 mcg by mouth daily.     CALCIUM  CITRATE-VITAMIN D PO Take 1 tablet by mouth in the morning, at noon, in the evening, and at bedtime.     clopidogrel  (PLAVIX ) 75 MG tablet Take 1 tablet (75 mg total) by mouth daily.     cyanocobalamin  (,VITAMIN B-12,) 1000 MCG/ML injection Inject 1,000 mcg into the muscle every 30 (thirty) days.     Denosumab  (XGEVA  [place]) Inject 1 Dose into the skin every 30 (thirty) days.     famotidine  (PEPCID ) 40 MG tablet Take 40 mg by mouth daily.     glucosamine-chondroitin 500-400 MG tablet Take 1 tablet by  mouth daily.     hydrocortisone  (ANUSOL -HC) 25 MG suppository Place 1 suppository (25 mg total) rectally 2 (two) times daily as needed for hemorrhoids or anal itching. 12 suppository 5   ketotifen (ZADITOR) 0.025 % ophthalmic solution Place 1 drop into both eyes daily as needed (allergies).     levofloxacin  (LEVAQUIN ) 750 MG tablet Take 1 tablet (750 mg total) by mouth daily. 14 tablet 0   levothyroxine  (SYNTHROID , LEVOTHROID) 88 MCG tablet Take 88 mcg by mouth daily before breakfast.     LORazepam  (ATIVAN ) 1 MG tablet Take 1 tablet (1 mg total) by mouth every 8 (eight) hours as needed for anxiety. 30 tablet 0   losartan  (COZAAR ) 25 MG tablet Take 25 mg by mouth daily.     Lutein 20 MG CAPS Take 20 mg by mouth daily.     meclizine  (ANTIVERT ) 25 MG tablet Take 25 mg by mouth 3 (three) times daily as needed for dizziness.     metroNIDAZOLE (METROCREAM) 0.75 % cream Apply 1 Application topically daily.     Multiple Vitamins-Minerals (MULTIVITAMIN WITH MINERALS) tablet Take 1 tablet by mouth daily.     Omega-3 1000 MG CAPS Take 1,000 mg by mouth in the morning and at bedtime.     ondansetron  (ZOFRAN ) 4 MG tablet Take 1 tablet (4 mg total) by mouth every 4 (four) hours as needed for nausea or vomiting. 20 tablet 5   pantoprazole  (PROTONIX ) 40 MG tablet Take 40 mg by mouth daily.     Polyethyl Glycol-Propyl Glycol 0.4-0.3 % SOLN Place 1-2 drops into both eyes 2 (two) times daily.     rOPINIRole  (REQUIP ) 2 MG tablet Take 4 mg by mouth at bedtime.     rosuvastatin  (CRESTOR ) 20 MG tablet Take 1 tablet (  20 mg total) by mouth daily.     triamcinolone  cream (KENALOG ) 0.1 % Apply topically 2 (two) times daily as needed.     vitamin E 180 MG (400 UNITS) capsule Take 400 Units by mouth daily.     zinc gluconate 50 MG tablet Take 50 mg by mouth daily.     No current facility-administered medications for this visit.    I,Jasmine M Lassiter,acting as a scribe for [name], MD.,have documented all  relevant documentation on the behalf of [name], MD,as directed by  [name], MD while in the presence of [name], MD.  "

## 2024-03-23 LAB — CANCER ANTIGEN 27.29: CA 27.29: 58.7 U/mL — ABNORMAL HIGH (ref 0.0–38.6)

## 2024-03-25 ENCOUNTER — Other Ambulatory Visit (HOSPITAL_COMMUNITY): Payer: Self-pay

## 2024-03-27 ENCOUNTER — Encounter: Payer: Self-pay | Admitting: Oncology

## 2024-03-28 ENCOUNTER — Telehealth: Payer: Self-pay

## 2024-03-28 NOTE — Telephone Encounter (Signed)
 Attempted to contact patient. No answer and no VM.

## 2024-03-28 NOTE — Telephone Encounter (Signed)
-----   Message from Wanda Cornish, MD sent at 03/27/2024  4:26 PM EST ----- Regarding: CALL Tell her CA 27.29 continues to decrease nicely. The radiologist agreed no significant changes, still see healing bone lesions. The only thing was a slight uptake in the rectal area.  When was her last colonoscopy?

## 2024-03-30 ENCOUNTER — Telehealth: Payer: Self-pay

## 2024-03-30 ENCOUNTER — Encounter: Payer: Self-pay | Admitting: Oncology

## 2024-03-30 ENCOUNTER — Other Ambulatory Visit (HOSPITAL_COMMUNITY): Payer: Self-pay

## 2024-03-30 NOTE — Telephone Encounter (Signed)
 Oral Oncology Patient Advocate Encounter   Was successful in securing patient a $27 grant from [employer] ([employer]) to provide copayment coverage for Verzenio .  This will keep the out of pocket expense at $0.     The billing information is as follows and has been shared with [facility].   RxBin: [id number] PCN:  [id number] Member ID: [id number] Group ID: [id number] Dates of Eligibility: 10/02/23 through 03/30/25  Breast

## 2024-04-08 ENCOUNTER — Other Ambulatory Visit: Payer: Self-pay

## 2024-04-12 ENCOUNTER — Other Ambulatory Visit: Payer: Self-pay

## 2024-04-12 NOTE — Progress Notes (Signed)
 Specialty Pharmacy Refill Coordination Note  Jade Mooney is a 79 y.o. female contacted today regarding refills of specialty medication(s) Abemaciclib  (VERZENIO )   Patient requested (Patient-Rptd) Delivery   Delivery date: 04/15/24   Verified address: (Patient-Rptd) [address], [place]   Medication will be filled on: 04/14/24

## 2024-04-14 ENCOUNTER — Other Ambulatory Visit: Payer: Self-pay

## 2024-04-19 ENCOUNTER — Inpatient Hospital Stay

## 2024-04-19 ENCOUNTER — Inpatient Hospital Stay: Admitting: Hematology and Oncology

## 2024-04-26 ENCOUNTER — Inpatient Hospital Stay: Admitting: Oncology

## 2024-04-26 ENCOUNTER — Inpatient Hospital Stay

## 2024-04-26 DIAGNOSIS — C50919 Malignant neoplasm of unspecified site of unspecified female breast: Secondary | ICD-10-CM

## 2024-05-02 ENCOUNTER — Inpatient Hospital Stay: Admitting: Hematology and Oncology

## 2024-05-02 ENCOUNTER — Inpatient Hospital Stay: Attending: Oncology

## 2024-05-02 ENCOUNTER — Inpatient Hospital Stay
